# Patient Record
Sex: Female | Born: 1960 | Race: White | Hispanic: No | Marital: Married | Smoking: Former smoker
Health system: Southern US, Community
[De-identification: ages and names within clinical notes are randomized; demographics above are authoritative.]

## PROBLEM LIST (undated history)

## (undated) DIAGNOSIS — E039 Hypothyroidism, unspecified: Secondary | ICD-10-CM

## (undated) DIAGNOSIS — I10 Essential (primary) hypertension: Secondary | ICD-10-CM

## (undated) DIAGNOSIS — J810 Acute pulmonary edema: Secondary | ICD-10-CM

## (undated) DIAGNOSIS — G2581 Restless legs syndrome: Secondary | ICD-10-CM

## (undated) DIAGNOSIS — E079 Disorder of thyroid, unspecified: Secondary | ICD-10-CM

## (undated) DIAGNOSIS — I2510 Atherosclerotic heart disease of native coronary artery without angina pectoris: Secondary | ICD-10-CM

## (undated) DIAGNOSIS — F419 Anxiety disorder, unspecified: Secondary | ICD-10-CM

## (undated) HISTORY — PX: COLONOSCOPY: SHX174

## (undated) HISTORY — PX: APPENDECTOMY: SHX54

## (undated) HISTORY — PX: TUBAL LIGATION: SHX77

## (undated) HISTORY — PX: ABDOMINAL HYSTERECTOMY: SHX81

## (undated) HISTORY — PX: CHOLECYSTECTOMY: SHX55

## (undated) HISTORY — PX: OTHER SURGICAL HISTORY: SHX169

## (undated) HISTORY — PX: KNEE ARTHROSCOPY: SUR90

## (undated) HISTORY — DX: Acute pulmonary edema: J81.0

---

## 2007-10-28 ENCOUNTER — Emergency Department (HOSPITAL_COMMUNITY): Admission: EM | Admit: 2007-10-28 | Discharge: 2007-10-28 | Payer: Self-pay | Admitting: Emergency Medicine

## 2010-05-08 ENCOUNTER — Emergency Department (HOSPITAL_BASED_OUTPATIENT_CLINIC_OR_DEPARTMENT_OTHER): Admission: EM | Admit: 2010-05-08 | Discharge: 2010-05-08 | Payer: Self-pay | Admitting: Emergency Medicine

## 2010-09-17 ENCOUNTER — Encounter
Admission: RE | Admit: 2010-09-17 | Discharge: 2010-09-17 | Payer: Self-pay | Source: Home / Self Care | Attending: Family Medicine | Admitting: Family Medicine

## 2011-06-17 ENCOUNTER — Ambulatory Visit (HOSPITAL_COMMUNITY)
Admission: RE | Admit: 2011-06-17 | Discharge: 2011-06-17 | Disposition: A | Discharge status: Home / Self Care | Payer: 59 | Source: Ambulatory Visit | Attending: Family Medicine | Admitting: Family Medicine

## 2011-06-17 ENCOUNTER — Other Ambulatory Visit (HOSPITAL_COMMUNITY): Payer: Self-pay | Admitting: Family Medicine

## 2011-06-17 DIAGNOSIS — R52 Pain, unspecified: Secondary | ICD-10-CM

## 2011-06-17 DIAGNOSIS — R0789 Other chest pain: Secondary | ICD-10-CM | POA: Insufficient documentation

## 2011-06-18 LAB — I-STAT 8, (EC8 V) (CONVERTED LAB)
Acid-base deficit: 1
Bicarbonate: 22.9
Glucose, Bld: 123 — ABNORMAL HIGH
HCT: 40
Operator id: 198171
Sodium: 139
pCO2, Ven: 36.8 — ABNORMAL LOW
pH, Ven: 7.402 — ABNORMAL HIGH

## 2011-06-18 LAB — D-DIMER, QUANTITATIVE: D-Dimer, Quant: 0.3

## 2011-06-18 LAB — DIFFERENTIAL
Basophils Absolute: 0.1
Basophils Relative: 0
Lymphocytes Relative: 25
Lymphs Abs: 3.4
Neutro Abs: 9.3 — ABNORMAL HIGH
Neutrophils Relative %: 69

## 2011-06-18 LAB — POCT CARDIAC MARKERS
Operator id: 198171
Operator id: 198171
Troponin i, poc: <0.05
Troponin i, poc: <0.05

## 2011-06-18 LAB — CBC
Hemoglobin: 13.6
MCV: 92.4
Platelets: 334
RDW: 12.7

## 2012-12-17 ENCOUNTER — Emergency Department (HOSPITAL_BASED_OUTPATIENT_CLINIC_OR_DEPARTMENT_OTHER)
Admission: EM | Admit: 2012-12-17 | Discharge: 2012-12-17 | Disposition: A | Discharge status: Home / Self Care | Payer: 59 | Attending: Emergency Medicine | Admitting: Emergency Medicine

## 2012-12-17 ENCOUNTER — Encounter (HOSPITAL_BASED_OUTPATIENT_CLINIC_OR_DEPARTMENT_OTHER): Payer: Self-pay | Admitting: *Deleted

## 2012-12-17 DIAGNOSIS — F17200 Nicotine dependence, unspecified, uncomplicated: Secondary | ICD-10-CM | POA: Insufficient documentation

## 2012-12-17 DIAGNOSIS — E039 Hypothyroidism, unspecified: Secondary | ICD-10-CM | POA: Insufficient documentation

## 2012-12-17 DIAGNOSIS — K089 Disorder of teeth and supporting structures, unspecified: Secondary | ICD-10-CM | POA: Insufficient documentation

## 2012-12-17 DIAGNOSIS — R220 Localized swelling, mass and lump, head: Secondary | ICD-10-CM | POA: Insufficient documentation

## 2012-12-17 DIAGNOSIS — K0889 Other specified disorders of teeth and supporting structures: Secondary | ICD-10-CM

## 2012-12-17 DIAGNOSIS — Z79899 Other long term (current) drug therapy: Secondary | ICD-10-CM | POA: Insufficient documentation

## 2012-12-17 DIAGNOSIS — K029 Dental caries, unspecified: Secondary | ICD-10-CM | POA: Insufficient documentation

## 2012-12-17 HISTORY — DX: Disorder of thyroid, unspecified: E07.9

## 2012-12-17 HISTORY — DX: Hypothyroidism, unspecified: E03.9

## 2012-12-17 MED ORDER — OXYCODONE-ACETAMINOPHEN 5-325 MG PO TABS: 2.0000 | ORAL_TABLET | ORAL | Status: DC | PRN

## 2012-12-17 NOTE — ED Notes (Signed)
Pt c/o dental pain x several days. States she has a bad tooth.

## 2012-12-22 NOTE — ED Provider Notes (Signed)
History     CSN: [id number]  Arrival date & time 12/17/12  1922   None     Chief Complaint  Patient presents with  . Dental Pain    (Consider location/radiation/quality/duration/timing/severity/associated sxs/prior treatment) Patient is a 52 y.o. female presenting with tooth pain. The history is provided by the patient.  Dental PainThe primary symptoms include mouth pain. Primary symptoms do not include fever. Episode onset: several days ago. The symptoms are worsening. The symptoms are new. The symptoms occur constantly.  Additional symptoms include: gum swelling and gum tenderness. Additional symptoms do not include: facial swelling.    Past Medical History  Diagnosis Date  . Thyroid disease   . Hypothyroid     Past Surgical History  Procedure Laterality Date  . Abdominal hysterectomy    . Cholecystectomy    . Appendectomy      History reviewed. No pertinent family history.  History  Substance Use Topics  . Smoking status: Current Every Day Smoker  . Smokeless tobacco: Not on file  . Alcohol Use: No    OB History   Grav Para Term Preterm Abortions TAB SAB Ect Mult Living                  Review of Systems  Constitutional: Negative for fever.  HENT: Negative for facial swelling.   All other systems reviewed and are negative.    Allergies  Review of patient's allergies indicates no known allergies.  Home Medications   Current Outpatient Rx  Name  Route  Sig  Dispense  Refill  . carbidopa-levodopa (SINEMET CR) 50-200 MG per tablet   Oral   Take 1 tablet by mouth at bedtime.         . carbidopa-levodopa (SINEMET IR) 25-100 MG per tablet   Oral   Take 1 tablet by mouth 4 (four) times daily.         Marland Kitchen levothyroxine (SYNTHROID, LEVOTHROID) 88 MCG tablet   Oral   Take 88 mcg by mouth daily.         . simvastatin (ZOCOR) 40 MG tablet   Oral   Take 40 mg by mouth every evening.         . topiramate (TOPAMAX) 100 MG tablet   Oral   Take  100 mg by mouth 2 (two) times daily.         Marland Kitchen venlafaxine (EFFEXOR) 75 MG tablet   Oral   Take 75 mg by mouth 2 (two) times daily.         Marland Kitchen oxyCODONE-acetaminophen (PERCOCET) 5-325 MG per tablet   Oral   Take 2 tablets by mouth every 4 (four) hours as needed for pain.   20 tablet   0     BP 164/70  Pulse 108  Temp(Src) 98.1 F (36.7 C) (Oral)  Resp 20  Ht 5\' 3"  (1.6 m)  Wt 160 lb (72.576 kg)  BMI 28.35 kg/m2  SpO2 100%  Physical Exam  Nursing note and vitals reviewed. Constitutional: She is oriented to person, place, and time. She appears well-developed and well-nourished. No distress.  HENT:  Head: Normocephalic and atraumatic.  There are multiple decayed teeth present bilaterally, both upper and lower.  There is no facial swelling or erythema.  Neck: Normal range of motion. Neck supple.  Musculoskeletal: Normal range of motion.  Neurological: She is alert and oriented to person, place, and time.  Skin: Skin is warm and dry. She is not diaphoretic.  ED Course  Procedures (including critical care time)  Labs Reviewed - No data to display No results found.   1. Pain, dental       MDM  Will provide pain meds, follow up with dentist.  Return prn.        Veryl Speak, MD 12/22/12 [phone]

## 2014-08-27 ENCOUNTER — Other Ambulatory Visit (HOSPITAL_BASED_OUTPATIENT_CLINIC_OR_DEPARTMENT_OTHER): Payer: Self-pay | Admitting: Family Medicine

## 2014-08-27 ENCOUNTER — Ambulatory Visit (HOSPITAL_BASED_OUTPATIENT_CLINIC_OR_DEPARTMENT_OTHER)
Admission: RE | Admit: 2014-08-27 | Discharge: 2014-08-27 | Disposition: A | Discharge status: Home / Self Care | Payer: 59 | Source: Ambulatory Visit | Attending: Physician Assistant | Admitting: Physician Assistant

## 2014-08-27 ENCOUNTER — Other Ambulatory Visit (HOSPITAL_BASED_OUTPATIENT_CLINIC_OR_DEPARTMENT_OTHER): Payer: Self-pay | Admitting: Physician Assistant

## 2014-08-27 DIAGNOSIS — M25572 Pain in left ankle and joints of left foot: Secondary | ICD-10-CM

## 2014-09-04 ENCOUNTER — Other Ambulatory Visit (HOSPITAL_COMMUNITY): Payer: Self-pay | Admitting: Surgery

## 2014-09-05 ENCOUNTER — Other Ambulatory Visit (HOSPITAL_COMMUNITY): Payer: Self-pay | Admitting: Orthopedic Surgery

## 2014-09-05 DIAGNOSIS — M25572 Pain in left ankle and joints of left foot: Secondary | ICD-10-CM

## 2014-09-17 ENCOUNTER — Ambulatory Visit (HOSPITAL_COMMUNITY)
Admission: RE | Admit: 2014-09-17 | Discharge: 2014-09-17 | Disposition: A | Discharge status: Home / Self Care | Payer: 59 | Source: Ambulatory Visit | Attending: Orthopedic Surgery | Admitting: Orthopedic Surgery

## 2014-09-17 DIAGNOSIS — M25572 Pain in left ankle and joints of left foot: Secondary | ICD-10-CM | POA: Diagnosis present

## 2014-09-17 DIAGNOSIS — M93872 Other specified osteochondropathies, left ankle and foot: Secondary | ICD-10-CM | POA: Diagnosis not present

## 2014-09-17 DIAGNOSIS — M25472 Effusion, left ankle: Secondary | ICD-10-CM | POA: Insufficient documentation

## 2014-11-27 ENCOUNTER — Encounter (HOSPITAL_BASED_OUTPATIENT_CLINIC_OR_DEPARTMENT_OTHER): Payer: Self-pay | Admitting: *Deleted

## 2014-11-27 NOTE — Progress Notes (Signed)
To bring all meds and overnight bag-no labs needed

## 2014-11-29 ENCOUNTER — Ambulatory Visit (HOSPITAL_BASED_OUTPATIENT_CLINIC_OR_DEPARTMENT_OTHER)
Admission: RE | Admit: 2014-11-29 | Discharge: 2014-11-29 | Disposition: A | Discharge status: Home / Self Care | Payer: 59 | Source: Ambulatory Visit | Attending: Orthopedic Surgery | Admitting: Orthopedic Surgery

## 2014-11-29 ENCOUNTER — Ambulatory Visit (HOSPITAL_BASED_OUTPATIENT_CLINIC_OR_DEPARTMENT_OTHER): Payer: 59 | Admitting: Anesthesiology

## 2014-11-29 ENCOUNTER — Encounter (HOSPITAL_BASED_OUTPATIENT_CLINIC_OR_DEPARTMENT_OTHER): Payer: Self-pay | Admitting: Certified Registered"

## 2014-11-29 ENCOUNTER — Encounter (HOSPITAL_BASED_OUTPATIENT_CLINIC_OR_DEPARTMENT_OTHER)
Admission: RE | Disposition: A | Discharge status: Home / Self Care | Payer: Self-pay | Source: Ambulatory Visit | Attending: Orthopedic Surgery

## 2014-11-29 DIAGNOSIS — G2581 Restless legs syndrome: Secondary | ICD-10-CM | POA: Diagnosis not present

## 2014-11-29 DIAGNOSIS — M216X2 Other acquired deformities of left foot: Secondary | ICD-10-CM | POA: Diagnosis not present

## 2014-11-29 DIAGNOSIS — F419 Anxiety disorder, unspecified: Secondary | ICD-10-CM | POA: Insufficient documentation

## 2014-11-29 DIAGNOSIS — Z79899 Other long term (current) drug therapy: Secondary | ICD-10-CM | POA: Insufficient documentation

## 2014-11-29 DIAGNOSIS — F17210 Nicotine dependence, cigarettes, uncomplicated: Secondary | ICD-10-CM | POA: Diagnosis not present

## 2014-11-29 DIAGNOSIS — E039 Hypothyroidism, unspecified: Secondary | ICD-10-CM | POA: Diagnosis not present

## 2014-11-29 DIAGNOSIS — Q898 Other specified congenital malformations: Secondary | ICD-10-CM | POA: Diagnosis present

## 2014-11-29 HISTORY — PX: OSTEOCHONDRAL DEFECT REPAIR/RECONSTRUCTION: SHX6232

## 2014-11-29 HISTORY — DX: Anxiety disorder, unspecified: F41.9

## 2014-11-29 HISTORY — DX: Restless legs syndrome: G25.81

## 2014-11-29 LAB — POCT HEMOGLOBIN-HEMACUE: Hemoglobin: 14.5 g/dL (ref 12.0–15.0)

## 2014-11-29 SURGERY — APPLICATION, GRAFT, OSTEOCHONDRAL, KNEE
Anesthesia: Regional | Site: Ankle | Laterality: Left

## 2014-11-29 MED ORDER — MIDAZOLAM HCL 2 MG/2ML IJ SOLN
1.0000 mg | INTRAMUSCULAR | Status: DC | PRN
  Administered 2014-11-29: 2 mg via INTRAVENOUS

## 2014-11-29 MED ORDER — PROMETHAZINE HCL 12.5 MG PO TABS: 12.5000 mg | ORAL_TABLET | Freq: Four times a day (QID) | ORAL | Status: DC | PRN

## 2014-11-29 MED ORDER — OXYCODONE HCL 5 MG PO TABS: 5.0000 mg | ORAL_TABLET | Freq: Once | ORAL | Status: DC | PRN

## 2014-11-29 MED ORDER — BACITRACIN ZINC 500 UNIT/GM EX OINT
TOPICAL_OINTMENT | CUTANEOUS | Status: DC | PRN
  Administered 2014-11-29: 1 via TOPICAL

## 2014-11-29 MED ORDER — EVICEL 2 ML EX KIT
PACK | CUTANEOUS | Status: DC | PRN
  Administered 2014-11-29: 2 mL

## 2014-11-29 MED ORDER — MIDAZOLAM HCL 2 MG/2ML IJ SOLN
INTRAMUSCULAR | Status: AC
  Filled 2014-11-29: qty 2

## 2014-11-29 MED ORDER — OXYCODONE HCL 5 MG/5ML PO SOLN: 5.0000 mg | Freq: Once | ORAL | Status: DC | PRN

## 2014-11-29 MED ORDER — FENTANYL CITRATE 0.05 MG/ML IJ SOLN
INTRAMUSCULAR | Status: AC
  Filled 2014-11-29: qty 6

## 2014-11-29 MED ORDER — 0.9 % SODIUM CHLORIDE (POUR BTL) OPTIME
TOPICAL | Status: DC | PRN
  Administered 2014-11-29: 200 mL

## 2014-11-29 MED ORDER — PROMETHAZINE HCL 25 MG/ML IJ SOLN: 6.2500 mg | INTRAMUSCULAR | Status: DC | PRN

## 2014-11-29 MED ORDER — CEFAZOLIN SODIUM-DEXTROSE 2-3 GM-% IV SOLR
INTRAVENOUS | Status: AC
  Filled 2014-11-29: qty 50

## 2014-11-29 MED ORDER — OXYCODONE HCL 5 MG PO TABS: 5.0000 mg | ORAL_TABLET | ORAL | Status: DC | PRN

## 2014-11-29 MED ORDER — ROPIVACAINE HCL 5 MG/ML IJ SOLN
INTRAMUSCULAR | Status: DC | PRN
  Administered 2014-11-29: 22.5 mL via PERINEURAL

## 2014-11-29 MED ORDER — HYDROMORPHONE HCL 1 MG/ML IJ SOLN: 0.2500 mg | INTRAMUSCULAR | Status: DC | PRN

## 2014-11-29 MED ORDER — SODIUM CHLORIDE 0.9 % IV SOLN
INTRAVENOUS | Status: DC
Start: 2014-11-29 — End: 2014-11-29

## 2014-11-29 MED ORDER — LACTATED RINGERS IV SOLN
INTRAVENOUS | Status: DC
  Administered 2014-11-29 (×2): via INTRAVENOUS

## 2014-11-29 MED ORDER — PROPOFOL 10 MG/ML IV BOLUS
INTRAVENOUS | Status: DC | PRN
  Administered 2014-11-29: 150 mg via INTRAVENOUS

## 2014-11-29 MED ORDER — FENTANYL CITRATE 0.05 MG/ML IJ SOLN
INTRAMUSCULAR | Status: AC
  Filled 2014-11-29: qty 2

## 2014-11-29 MED ORDER — CHLORHEXIDINE GLUCONATE 4 % EX LIQD: 60.0000 mL | Freq: Once | CUTANEOUS | Status: DC

## 2014-11-29 MED ORDER — FENTANYL CITRATE 0.05 MG/ML IJ SOLN
50.0000 ug | INTRAMUSCULAR | Status: DC | PRN
  Administered 2014-11-29: 100 ug via INTRAVENOUS

## 2014-11-29 MED ORDER — BUPIVACAINE-EPINEPHRINE (PF) 0.5% -1:200000 IJ SOLN
INTRAMUSCULAR | Status: AC
  Filled 2014-11-29: qty 30

## 2014-11-29 MED ORDER — FENTANYL CITRATE 0.05 MG/ML IJ SOLN
INTRAMUSCULAR | Status: DC | PRN
  Administered 2014-11-29: 25 ug via INTRAVENOUS

## 2014-11-29 MED ORDER — BUPIVACAINE-EPINEPHRINE (PF) 0.5% -1:200000 IJ SOLN
INTRAMUSCULAR | Status: DC | PRN
  Administered 2014-11-29: 22.5 mL via PERINEURAL

## 2014-11-29 MED ORDER — ASPIRIN EC 325 MG PO TBEC: 325.0000 mg | DELAYED_RELEASE_TABLET | Freq: Every day | ORAL | Status: DC

## 2014-11-29 MED ORDER — BACITRACIN ZINC 500 UNIT/GM EX OINT
TOPICAL_OINTMENT | CUTANEOUS | Status: AC
  Filled 2014-11-29: qty 28.35

## 2014-11-29 MED ORDER — CEFAZOLIN SODIUM-DEXTROSE 2-3 GM-% IV SOLR
2.0000 g | INTRAVENOUS | Status: AC
  Administered 2014-11-29: 2 g via INTRAVENOUS

## 2014-11-29 MED ORDER — ONDANSETRON HCL 4 MG/2ML IJ SOLN
INTRAMUSCULAR | Status: DC | PRN
  Administered 2014-11-29: 4 mg via INTRAVENOUS

## 2014-11-29 MED ORDER — DEXAMETHASONE SODIUM PHOSPHATE 10 MG/ML IJ SOLN
INTRAMUSCULAR | Status: DC | PRN
  Administered 2014-11-29: 10 mg via INTRAVENOUS

## 2014-11-29 SURGICAL SUPPLY — 70 items
ARTICULAR CARTLIGE LIVE HUMAN (Tissue) ×2 IMPLANT
BANDAGE ESMARK 6X9 LF (GAUZE/BANDAGES/DRESSINGS) ×1 IMPLANT
BIT DRILL 2.9 CANN QC NONSTRL (BIT) ×2 IMPLANT
BIT TREPHINE CORING 8 (BIT) ×2 IMPLANT
BLADE MICRO SAGITTAL (BLADE) ×2 IMPLANT
BLADE SURG 15 STRL LF DISP TIS (BLADE) ×3 IMPLANT
BLADE SURG 15 STRL SS (BLADE) ×3
BNDG COHESIVE 4X5 TAN STRL (GAUZE/BANDAGES/DRESSINGS) ×2 IMPLANT
BNDG COHESIVE 6X5 TAN STRL LF (GAUZE/BANDAGES/DRESSINGS) ×2 IMPLANT
BNDG ESMARK 6X9 LF (GAUZE/BANDAGES/DRESSINGS) ×2
CARTILAGE ARTICULAR LIVE HUMAN (Tissue) ×1 IMPLANT
COVER BACK TABLE 60X90IN (DRAPES) ×2 IMPLANT
DECANTER SPIKE VIAL GLASS SM (MISCELLANEOUS) IMPLANT
DRAPE C-ARM 42X72 X-RAY (DRAPES) IMPLANT
DRAPE C-ARMOR (DRAPES) IMPLANT
DRAPE EXTREMITY T 121X128X90 (DRAPE) ×2 IMPLANT
DRAPE OEC MINIVIEW 54X84 (DRAPES) ×2 IMPLANT
DRAPE U 20/CS (DRAPES) IMPLANT
DRAPE U-SHAPE 47X51 STRL (DRAPES) ×2 IMPLANT
DRSG ADAPTIC 3X8 NADH LF (GAUZE/BANDAGES/DRESSINGS) IMPLANT
DRSG EMULSION OIL 3X3 NADH (GAUZE/BANDAGES/DRESSINGS) ×2 IMPLANT
DRSG PAD ABDOMINAL 8X10 ST (GAUZE/BANDAGES/DRESSINGS) ×4 IMPLANT
DURAPREP 26ML APPLICATOR (WOUND CARE) ×2 IMPLANT
ELECT REM PT RETURN 9FT ADLT (ELECTROSURGICAL) ×2
ELECTRODE REM PT RTRN 9FT ADLT (ELECTROSURGICAL) ×1 IMPLANT
GLOVE BIO SURGEON STRL SZ8 (GLOVE) ×2 IMPLANT
GLOVE BIOGEL PI IND STRL 7.0 (GLOVE) ×2 IMPLANT
GLOVE BIOGEL PI IND STRL 8 (GLOVE) ×1 IMPLANT
GLOVE BIOGEL PI INDICATOR 7.0 (GLOVE) ×2
GLOVE BIOGEL PI INDICATOR 8 (GLOVE) ×1
GLOVE ECLIPSE 6.5 STRL STRAW (GLOVE) ×4 IMPLANT
GLOVE EXAM NITRILE MD LF STRL (GLOVE) ×2 IMPLANT
GOWN STRL REUS W/ TWL LRG LVL3 (GOWN DISPOSABLE) ×3 IMPLANT
GOWN STRL REUS W/ TWL XL LVL3 (GOWN DISPOSABLE) ×1 IMPLANT
GOWN STRL REUS W/TWL LRG LVL3 (GOWN DISPOSABLE) ×3
GOWN STRL REUS W/TWL XL LVL3 (GOWN DISPOSABLE) ×1
K-WIRE ACE 1.6X6 (WIRE) ×6
KWIRE ACE 1.6X6 (WIRE) ×3 IMPLANT
NEEDLE HYPO 22GX1.5 SAFETY (NEEDLE) IMPLANT
PACK BASIN DAY SURGERY FS (CUSTOM PROCEDURE TRAY) ×2 IMPLANT
PAD CAST 4YDX4 CTTN HI CHSV (CAST SUPPLIES) ×2 IMPLANT
PADDING CAST ABS 4INX4YD NS (CAST SUPPLIES) ×1
PADDING CAST ABS COTTON 4X4 ST (CAST SUPPLIES) ×1 IMPLANT
PADDING CAST COTTON 4X4 STRL (CAST SUPPLIES) ×2
PADDING CAST COTTON 6X4 STRL (CAST SUPPLIES) ×2 IMPLANT
PENCIL BUTTON HOLSTER BLD 10FT (ELECTRODE) ×2 IMPLANT
SANITIZER HAND PURELL 535ML FO (MISCELLANEOUS) ×2 IMPLANT
SCREW ACE CAN 4.0 36M (Screw) ×2 IMPLANT
SCREW ACE CAN 4.0 42M (Screw) ×2 IMPLANT
SHEET MEDIUM DRAPE 40X70 STRL (DRAPES) ×2 IMPLANT
SLEEVE SCD COMPRESS KNEE MED (MISCELLANEOUS) ×2 IMPLANT
SPLINT FAST PLASTER 5X30 (CAST SUPPLIES) ×20
SPLINT PLASTER CAST FAST 5X30 (CAST SUPPLIES) ×20 IMPLANT
SPONGE LAP 18X18 X RAY DECT (DISPOSABLE) ×2 IMPLANT
SPONGE SURGIFOAM ABS GEL 12-7 (HEMOSTASIS) ×2 IMPLANT
STOCKINETTE 6  STRL (DRAPES) ×1
STOCKINETTE 6 STRL (DRAPES) ×1 IMPLANT
SUCTION FRAZIER TIP 10 FR DISP (SUCTIONS) ×2 IMPLANT
SUT ETHILON 3 0 PS 1 (SUTURE) IMPLANT
SUT MNCRL AB 3-0 PS2 18 (SUTURE) ×2 IMPLANT
SUT VIC AB 0 SH 27 (SUTURE) IMPLANT
SUT VIC AB 2-0 SH 27 (SUTURE)
SUT VIC AB 2-0 SH 27XBRD (SUTURE) IMPLANT
SYR 20CC LL (SYRINGE) IMPLANT
SYR BULB 3OZ (MISCELLANEOUS) ×2 IMPLANT
TAP 4 QC CANN F/CANC SCRW (TRAUMA) ×2 IMPLANT
TOWEL OR 17X24 6PK STRL BLUE (TOWEL DISPOSABLE) ×2 IMPLANT
TUBE CONNECTING 20X1/4 (TUBING) ×2 IMPLANT
UNDERPAD 30X30 INCONTINENT (UNDERPADS AND DIAPERS) ×2 IMPLANT
YANKAUER SUCT BULB TIP NO VENT (SUCTIONS) IMPLANT

## 2014-11-29 NOTE — Brief Op Note (Signed)
11/29/2014  11:18 AM  PATIENT:  Meghan Terry  54 y.o. female  PRE-OPERATIVE DIAGNOSIS:  LEFT Talus osteochondral defect  POST-OPERATIVE DIAGNOSIS:  same  Procedure(s): 1.  Excision of left medial talus cyst with medial malleolar osteotomy 2.  Autograft bone from the calcaneus to the talus 3.  Denovo allograft cartilage to the talus 4.  AP, mortise and lateral xrays of the left ankle  SURGEON:  Wylene Simmer, MD  ASSISTANT: n/a  ANESTHESIA:   General, regional  EBL:  minimal   TOURNIQUET:   Total Tourniquet Time Documented: Thigh (Left) - 83 minutes Total: Thigh (Left) - 83 minutes  COMPLICATIONS:  None apparent  DISPOSITION:  Extubated, awake and stable to recovery.  DICTATION ID:  [id number]

## 2014-11-29 NOTE — Transfer of Care (Signed)
Immediate Anesthesia Transfer of Care Note  Patient: Meghan Terry  Procedure(s) Performed: Procedure(s): LEFT ANKLE MEDIAL MALLEOLUS OSTEOTOMY,AUTO GRAFT FROM CALCANEOUS,OS TIBIA ,DENORO GRAFTING TALUS (Left)  Patient Location: PACU  Anesthesia Type:GA combined with regional for post-op pain  Level of Consciousness: awake and patient cooperative  Airway & Oxygen Therapy: Patient Spontanous Breathing and Patient connected to face mask oxygen  Post-op Assessment: Report given to RN and Post -op Vital signs reviewed and stable  Post vital signs: Reviewed and stable  Last Vitals:  Filed Vitals:   11/29/14 0855  BP:   Pulse: 100  Temp:   Resp: 15    Complications: No apparent anesthesia complications

## 2014-11-29 NOTE — Anesthesia Postprocedure Evaluation (Signed)
  Anesthesia Post-op Note  Patient: Meghan Terry  Procedure(s) Performed: Procedure(s): LEFT ANKLE MEDIAL MALLEOLUS OSTEOTOMY,AUTO GRAFT FROM CALCANEOUS;  OS TIBIA DENORO GRAFTING TALUS (Left)  Patient Location: PACU  Anesthesia Type:GA combined with regional for post-op pain  Level of Consciousness: awake and alert   Airway and Oxygen Therapy: Patient Spontanous Breathing  Post-op Pain: none  Post-op Assessment: Post-op Vital signs reviewed  Post-op Vital Signs: Reviewed  Last Vitals:  Filed Vitals:   11/29/14 1300  BP: 123/69  Pulse: 89  Temp: 36.5 C  Resp: 20    Complications: No apparent anesthesia complications

## 2014-11-29 NOTE — Discharge Instructions (Signed)
Post Anesthesia Home Care Instructions  Activity: Get plenty of rest for the remainder of the day. A responsible adult should stay with you for 24 hours following the procedure.  For the next 24 hours, DO NOT: -Drive a car -Paediatric nurse -Drink alcoholic beverages -Take any medication unless instructed by your physician -Make any legal decisions or sign important papers.  Meals: Start with liquid foods such as gelatin or soup. Progress to regular foods as tolerated. Avoid greasy, spicy, heavy foods. If nausea and/or vomiting occur, drink only clear liquids until the nausea and/or vomiting subsides. Call your physician if vomiting continues.  Special Instructions/Symptoms: Your throat may feel dry or sore from the anesthesia or the breathing tube placed in your throat during surgery. If this causes discomfort, gargle with warm salt water. The discomfort should disappear within 24 hours. Regional Anesthesia Blocks  1. Numbness or the inability to move the "blocked" extremity may last from 3-48 hours after placement. The length of time depends on the medication injected and your individual response to the medication. If the numbness is not going away after 48 hours, call your surgeon.  2. The extremity that is blocked will need to be protected until the numbness is gone and the  Strength has returned. Because you cannot feel it, you will need to take extra care to avoid injury. Because it may be weak, you may have difficulty moving it or using it. You may not know what position it is in without looking at it while the block is in effect.  3. For blocks in the legs and feet, returning to weight bearing and walking needs to be done carefully. You will need to wait until the numbness is entirely gone and the strength has returned. You should be able to move your leg and foot normally before you try and bear weight or walk. You will need someone to be with you when you first try to ensure you do  not fall and possibly risk injury.  4. Bruising and tenderness at the needle site are common side effects and will resolve in a few days.  5. Persistent numbness or new problems with movement should be communicated to the surgeon or the [place] [phone] [place] [phone]).  Wylene Simmer, MD [place]  Please read the following information regarding your care after surgery.  Medications  You only need a prescription for the narcotic pain medicine (ex. oxycodone, Percocet, Norco).  All of the other medicines listed below are available over the counter. X acetominophen (Tylenol) 650 mg every 4-6 hours as you need for minor pain X oxycodone as prescribed for moderate to severe pain  Narcotic pain medicine (ex. oxycodone, Percocet, Vicodin) will cause constipation.  To prevent this problem, take the following medicines while you are taking any pain medicine. X docusate sodium (Colace) 100 mg twice a day X senna (Senokot) 2 tablets twice a day  X To help prevent blood clots, take an aspirin (325 mg) once a day for a month after surgery.  You should also get up every hour while you are awake to move around.    Weight Bearing ? Bear weight when you are able on your operated leg or foot. ? Bear weight only on the heel of your operated foot in the post-op shoe. X Do not bear any weight on the operated leg or foot.  Cast / Splint / Dressing X Keep your splint or cast clean and dry.  Dont put anything (coat hanger, pencil, etc) down inside of it.  If it gets damp, use a hair dryer on the cool setting to dry it.  If it gets soaked, call the office to schedule an appointment for a cast change. ? Remove your dressing 3 days after surgery and cover the incisions with dry dressings.    After your dressing, cast or splint is removed; you may shower, but do not soak or scrub the wound.  Allow the water to run over it, and then gently pat it  dry.  Swelling It is normal for you to have swelling where you had surgery.  To reduce swelling and pain, keep your toes above your nose for at least 3 days after surgery.  It may be necessary to keep your foot or leg elevated for several weeks.  If it hurts, it should be elevated.  Follow Up Call my office at [phone] when you are discharged from the hospital or surgery center to schedule an appointment to be seen two weeks after surgery.  Call my office at [phone] if you develop a fever >101.5 F, nausea, vomiting, bleeding from the surgical site or severe pain.

## 2014-11-29 NOTE — Anesthesia Procedure Notes (Addendum)
Anesthesia Regional Block:  Popliteal block  Pre-Anesthetic Checklist: ,, timeout performed, Correct Patient, Correct Site, Correct Laterality, Correct Procedure, Correct Position, site marked, Risks and benefits discussed,  Surgical consent,  Pre-op evaluation,  At surgeon's request and post-op pain management  Laterality: Left  Prep: chloraprep       Needles:  Injection technique: Single-shot  Needle Type: Echogenic Stimulator Needle     Needle Length: 9cm 9 cm Needle Gauge: 21 and 21 G    Additional Needles:  Procedures: ultrasound guided (picture in chart) and nerve stimulator Popliteal block  Nerve Stimulator or Paresthesia:  Response: tibial, 0.45 mA,  Response: peroneal , 0.45 mA,   Additional Responses:   Narrative:  Start time: 11/29/2014 8:40 AM End time: 11/29/2014 8:47 AM Injection made incrementally with aspirations every 5 mL.  Performed by: Personally  Anesthesiologist: Suzette Battiest E  Additional Notes: Risks and benefits explained to pt. Tolerated procedure well with no immediate complications. Total of 15 cc's 0.5% bupi with 1:200k epi and 15cc's 0.5% ropi injected.   Anesthesia Regional Block:  Adductor canal block  Pre-Anesthetic Checklist: ,, timeout performed, Correct Patient, Correct Site, Correct Laterality, Correct Procedure, Correct Position, site marked, Risks and benefits discussed,  Surgical consent,  Pre-op evaluation,  At surgeon's request and post-op pain management  Laterality: Left  Prep: chloraprep       Needles:  Injection technique: Single-shot  Needle Type: Echogenic Needle     Needle Length: 9cm 9 cm Needle Gauge: 21 and 21 G    Additional Needles:  Procedures: ultrasound guided (picture in chart) Adductor canal block Narrative:  Start time: 11/29/2014 8:47 AM End time: 11/29/2014 8:50 AM Injection made incrementally with aspirations every 5 mL.  Performed by: Personally  Anesthesiologist: Suzette Battiest  E  Additional Notes: Risks and benefits explained to pt. Pt tolerated procedure well with no immediate complications. 7.5cc's 0.5% bupi with 1:200k epi and 7.5cc's 0.5% ropi injected.   Procedure Name: LMA Insertion Date/Time: 11/29/2014 9:19 AM Performed by: Shamica Moree Pre-anesthesia Checklist: Patient identified, Emergency Drugs available, Suction available and Patient being monitored Patient Re-evaluated:Patient Re-evaluated prior to inductionOxygen Delivery Method: Circle System Utilized Preoxygenation: Pre-oxygenation with 100% oxygen Intubation Type: IV induction Ventilation: Mask ventilation without difficulty LMA: LMA inserted LMA Size: 4.0 Number of attempts: 1 Airway Equipment and Method: Bite block Placement Confirmation: positive ETCO2 Tube secured with: Tape Dental Injury: Teeth and Oropharynx as per pre-operative assessment

## 2014-11-29 NOTE — Progress Notes (Signed)
Assisted Dr. Rodman Comp with left, ultrasound guided, popliteal/saphenous block. Side rails up, monitors on throughout procedure. See vital signs in flow sheet. Tolerated Procedure well.

## 2014-11-29 NOTE — Anesthesia Preprocedure Evaluation (Addendum)
Anesthesia Evaluation  Patient identified by MRN, date of birth, ID band Patient awake    Reviewed: Allergy & Precautions, NPO status , Patient's Chart, lab work & pertinent test results  Airway Mallampati: II  TM Distance: >3 FB Neck ROM: Full    Dental  (+) Lower Dentures, Upper Dentures   Pulmonary Current Smoker,  breath sounds clear to auscultation        Cardiovascular negative cardio ROS  Rhythm:Regular Rate:Normal     Neuro/Psych negative neurological ROS     GI/Hepatic negative GI ROS, Neg liver ROS,   Endo/Other  Hypothyroidism   Renal/GU negative Renal ROS     Musculoskeletal   Abdominal   Peds  Hematology negative hematology ROS (+)   Anesthesia Other Findings   Reproductive/Obstetrics                            Anesthesia Physical Anesthesia Plan  ASA: II  Anesthesia Plan: General and Regional   Post-op Pain Management:    Induction: Intravenous  Airway Management Planned: LMA and Oral ETT  Additional Equipment:   Intra-op Plan:   Post-operative Plan:   Informed Consent: I have reviewed the patients History and Physical, chart, labs and discussed the procedure including the risks, benefits and alternatives for the proposed anesthesia with the patient or authorized representative who has indicated his/her understanding and acceptance.   Dental advisory given  Plan Discussed with: CRNA  Anesthesia Plan Comments:         Anesthesia Quick Evaluation

## 2014-11-29 NOTE — H&P (Signed)
Meghan Terry is an 54 y.o. female.   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  She has failed non operateive treatment to date and presents today for surgical treatment.  Past Medical History  Diagnosis Date  . Thyroid disease   . Hypothyroid   . Anxiety   . RLS (restless legs syndrome)     Past Surgical History  Procedure Laterality Date  . Abdominal hysterectomy    . Cholecystectomy    . Appendectomy    . Tubal ligation    . Colonoscopy      History reviewed. No pertinent family history. Social History:  reports that she has been smoking.  She does not have any smokeless tobacco history on file. She reports that she does not drink alcohol or use illicit drugs.  Allergies: No Known Allergies  Medications Prior to Admission  Medication Sig Dispense Refill  . buPROPion (WELLBUTRIN) 75 MG tablet Take 75 mg by mouth every morning.    . carbidopa-levodopa (SINEMET CR) 50-200 MG per tablet Take 1 tablet by mouth at bedtime.    . carbidopa-levodopa (SINEMET IR) 25-100 MG per tablet Take 1 tablet by mouth 4 (four) times daily.    Marland Kitchen levothyroxine (SYNTHROID, LEVOTHROID) 88 MCG tablet Take 88 mcg by mouth daily.    Marland Kitchen topiramate (TOPAMAX) 100 MG tablet Take 100 mg by mouth 2 (two) times daily.    Marland Kitchen venlafaxine (EFFEXOR) 75 MG tablet Take 75 mg by mouth 2 (two) times daily.      Results for orders placed or performed during the hospital encounter of 12/23/2014 (from the past 48 hour(s))  Hemoglobin-hemacue, POC     Status: None   Collection Time: 23-Dec-2014  8:15 AM  Result Value Ref Range   Hemoglobin 14.5 12.0 - 15.0 g/dL   No results found.  ROS  No recent f/c/n/v/wt loss  Blood pressure 141/77, pulse 88, temperature 98 F (36.7 C), temperature source Oral, resp. rate 16, height 5\' 2"  (1.575 m), weight 76.885 kg (169 lb 8 oz), SpO2 99 %. Physical Exam  wn wd woman in nad.  A and O x 4.  Mood and affect  normal.  EOMI.  resp unlabored.  L ankle with healthy skin.  No lymphaenopathy.  Normal sens to LT at the foot.  5/5 strength in PF and DF of the ankle and toes.  2+ dp and pt pulses.  Assessment/Plan L talus osteochondral defect.  To OR for bone grafting of the left osteochondral defect from the calcaneus and allograft cartilage implantation.  The risks and benefits of the alternative treatment options have been discussed in detail.  The patient wishes to proceed with surgery and specifically understands risks of bleeding, infection, nerve damage, blood clots, need for additional surgery, amputation and death.   Wylene Simmer 2014-12-23, 8:56 AM

## 2014-11-30 ENCOUNTER — Encounter (HOSPITAL_BASED_OUTPATIENT_CLINIC_OR_DEPARTMENT_OTHER): Payer: Self-pay | Admitting: Orthopedic Surgery

## 2014-11-30 NOTE — Op Note (Signed)
NAMEMarland Kitchen  Meghan Terry, Meghan Terry NO.:  [id number]  MEDICAL RECORD NO.:  [id number]  LOCATION:                                 FACILITY:  PHYSICIAN:  Wylene Simmer, MD        DATE OF BIRTH:  Sep 27, 1961  DATE OF PROCEDURE:  11/29/2014 DATE OF DISCHARGE:                              OPERATIVE REPORT   PREOPERATIVE DIAGNOSIS:  Left talus osteochondral defect.  POSTOPERATIVE DIAGNOSIS:  Left talus osteochondral defect.  PROCEDURE: 1. Excision of left medial talus cyst with medial malleolar osteotomy. 2. Autograft bone from the calcaneus to the talus. 3. DeNovo allograft cartilage to the talus. 4. AP, mortise, and lateral radiographs of the left ankle.  SURGEON:  Wylene Simmer, MD  ANESTHESIA:  General, regional.  ESTIMATED BLOOD LOSS:  Minimal tourniquet.  TIME OF TOURNIQUET:  83 minutes at 250 mmHg.  COMPLICATIONS:  None apparent.  DISPOSITION:  Extubated, awake and stable to recovery.  INDICATIONS FOR PROCEDURE:  The patient is a 54 year old woman with left ankle pain after an injury at work.  She was found have a cystic osteochondral lesion of the medial talar dome.  She has failed nonoperative treatment to date and presents today for surgical care. She understands the risks, benefits, the alternative treatment options, and elects surgical treatment.  She specifically understands risks of bleeding, infection, nerve damage, blood clots, need for additional surgery, continued pain, amputation, and death.  PROCEDURE IN DETAIL:  After preoperative consent was obtained and the correct operative site was identified, the patient was brought to the operating room and placed supine on the operating table.  General anesthesia was induced.  Preoperative antibiotics were administered. Surgical time-out was taken.  Left lower extremity was prepped and draped in standard sterile fashion with tourniquet around the thigh. Extremity was exsanguinated and tourniquet was inflated to  250 mmHg.  A longitudinal incision was then made over the medial ankle.  Sharp dissection was carried down through the skin and subcutaneous tissue. Care was taken to protect the saphenous nerve and vein.  The posterior tibial tendon sheath was incised and retractor was placed subperiosteally posterior and anterior to the tibia protecting the tendons and neurovascular structures.  A guide pin was then inserted through the metaphyseal bone of the medial tibia, angled towards the corner of the medial tibial plafond.  AP and lateral radiographs confirmed appropriate position of this guide pin.  An oscillating saw was then used to create an osteotomy from proximal to distal into the medial tibial plafond.  The saw was removed and the osteotomy was completed with an osteotome into the ankle joint preserving the cartilage at that level.  Prior to completing the osteotomy, drill holes were made perpendicular to the saw cut and were tapped for 4 mm cannulated screws.  The medial talar dome was then exposed.  The cystic lesion was identified.  The unstable cartilage was curetted and removed to a stable rim.  The lesion measured 1.1 cm x 0.8 cm.  The subchondral bone was largely intact except for a small hole about 3 mm across that accessed to the cyst below that.  The cyst was  curetted of all synovial tissue and irrigated copiously.  Attention was then turned to the lateral aspect of the heel where an oblique incision was made over the tuberosity of the calcaneus.  Sharp dissection was carried down through the skin and subcutaneous tissue. An 8 mm trephine was used to harvest an adequate amount of bone graft. The wound was irrigated and a Gelfoam roll was placed within the bone. The incision was closed with 3-0 nylon.  Bone graft was then placed in the cyst and impacted into position to fill it appropriately.  A layer of Tisseel was placed on the subchondral bone and allowed to become  tacky.  On the back table, the [facility] allograft package was opened and all of the liquid removed.  The particles were then arranged on the foil in a 1 mm x 0.8 mm monolayer.  Tisseel was applied creating a patty.  This was allowed to thicken.  The patty was then placed within the talar defect and another thin layer of Tisseel placed over the top.  The joint was reduced and range of motion in plantar flexion and dorsiflexion was tested.  There was no delaminating of the cartilage pieces.  The medial malleolar osteotomy fragment was then replaced and 2 screws inserted and tightened appropriately.  AP, mortise, and lateral radiographs confirmed appropriate reduction of the osteotomy.  The medial wound was then irrigated and closed with Monocryl and nylon.  Sterile dressings were applied followed by well-padded short-leg splint.  Tourniquet was released at 83 minutes.  The patient was awakened from anesthesia and transported to recovery room in stable condition.  FOLLOWUP PLAN:  The patient will be nonweightbearing on the left lower extremity.  She will follow up with me in 2 weeks for suture removal and conversion to a short-leg cast.  The patient will take aspirin for DVT prophylaxis.  RADIOGRAPHS:  AP, lateral, and mortise radiographs of the left ankle were obtained intraoperatively.  These show appropriate reduction of the medial malleolar osteotomy site, and no evidence of any acute injury.     Wylene Simmer, MD     JH/MEDQ  D:  11/29/2014  T:  11/30/2014  Job:  [id number]

## 2015-09-30 MED FILL — LEVOTHYROXINE 75 MCG TABLET: 75 | 90 days supply | Qty: 90 | Fill #0

## 2015-10-23 MED FILL — CARBIDOPA-LEVO ER 50-200 TA: 50-200 | 30 days supply | Qty: 30 | Fill #1

## 2015-10-23 MED FILL — CARBIDOPA-LEVODOPA 25-250 T: 25-250 | 90 days supply | Qty: 270 | Fill #2

## 2015-10-23 MED FILL — ARIPiprazole 5 MG TABS: 5 | 30 days supply | Qty: 30 | Fill #6

## 2015-11-22 MED FILL — ARIPiprazole 5 MG TABS: 5 | 30 days supply | Qty: 30 | Fill #7

## 2015-11-22 MED FILL — VENLAFAXINE HCL ER 75 MG CA: 75 | 30 days supply | Qty: 60 | Fill #1

## 2015-11-22 MED FILL — CARBIDOPA-LEVO ER 50-200 TA: 50-200 | 30 days supply | Qty: 30 | Fill #2

## 2015-11-22 MED FILL — metFORMIN HCL 1000 MG TABS: 1000 | 30 days supply | Qty: 30 | Fill #0

## 2015-11-29 MED FILL — BUPROPION HCL XL 150 MG TAB: 150 | 90 days supply | Qty: 180 | Fill #0

## 2015-12-17 MED FILL — CIPROFLOXACIN HCL 500 MG TA: 500 | 7 days supply | Qty: 14 | Fill #0

## 2015-12-17 MED FILL — BUPROPION HCL XL 300 MG TAB: 300 | 90 days supply | Qty: 90 | Fill #0

## 2015-12-17 MED FILL — metFORMIN HCL 1000 MG TABS: 1000 | 90 days supply | Qty: 180 | Fill #0

## 2015-12-17 MED FILL — CARBIDOPA-LEVODOPA 25-250 T: 25-250 | 90 days supply | Qty: 360 | Fill #0

## 2015-12-19 MED FILL — SULFAMETHOXAZOLE-TMP DS TAB: 800-160 | 7 days supply | Qty: 14 | Fill #0

## 2016-01-03 MED FILL — CARBIDOPA-LEVO ER 50-200 TA: 50-200 | 30 days supply | Qty: 30 | Fill #3

## 2016-01-03 MED FILL — ARIPiprazole 5 MG TABS: 5 | 30 days supply | Qty: 30 | Fill #8

## 2016-01-03 MED FILL — VENLAFAXINE HCL ER 75 MG CA: 75 | 30 days supply | Qty: 60 | Fill #2

## 2016-01-24 MED FILL — TOPIRAMATE 100 MG TABLET: 100 | 90 days supply | Qty: 180 | Fill #0

## 2016-01-31 MED FILL — CARBIDOPA-LEVO ER 50-200 TA: 50-200 | 30 days supply | Qty: 30 | Fill #4

## 2016-01-31 MED FILL — LEVOTHYROXINE 75 MCG TABLET: 75 | 30 days supply | Qty: 30 | Fill #1

## 2016-02-17 MED FILL — VENLAFAXINE HCL ER 75 MG CA: 75 | 90 days supply | Qty: 270 | Fill #0

## 2016-02-17 MED FILL — LORazepam 0.5 MG TABS: 0.5 | 15 days supply | Qty: 30 | Fill #0

## 2016-02-18 MED FILL — ARIPiprazole 5 MG TABS: 5 | 90 days supply | Qty: 90 | Fill #0

## 2016-03-06 MED FILL — LEVOTHYROXINE 75 MCG TABLET: 75 | 30 days supply | Qty: 30 | Fill #2

## 2016-03-06 MED FILL — CARBIDOPA-LEVO ER 50-200 TA: 50-200 | 30 days supply | Qty: 30 | Fill #5

## 2016-03-20 MED FILL — CARBIDOPA-LEVODOPA 25-250 T: 25-250 | 90 days supply | Qty: 360 | Fill #1

## 2016-04-02 MED FILL — BUPROPION HCL XL 300 MG TAB: 300 | 90 days supply | Qty: 90 | Fill #0

## 2016-04-10 MED FILL — LEVOTHYROXINE 75 MCG TABLET: 75 | 30 days supply | Qty: 30 | Fill #3

## 2016-04-10 MED FILL — CARBIDOPA-LEVO ER 50-200 TA: 50-200 | 30 days supply | Qty: 30 | Fill #0

## 2016-04-13 MED FILL — metFORMIN HCL 1000 MG TABS: 1000 | 30 days supply | Qty: 60 | Fill #0

## 2016-05-08 MED FILL — CARBIDOPA-LEVO ER 50-200 TA: 50-200 | 30 days supply | Qty: 30 | Fill #1

## 2016-05-08 MED FILL — metFORMIN HCL 1000 MG TABS: 1000 | 30 days supply | Qty: 60 | Fill #1

## 2016-05-08 MED FILL — LEVOTHYROXINE 75 MCG TABLET: 75 | 30 days supply | Qty: 30 | Fill #4

## 2016-05-08 MED FILL — TOPIRAMATE 100 MG TABLET: 100 | 90 days supply | Qty: 180 | Fill #1

## 2016-05-08 MED FILL — ARIPiprazole 5 MG TABS: 5 | 90 days supply | Qty: 90 | Fill #1

## 2016-05-15 MED FILL — LORazepam 0.5 MG TABS: 0.5 | 15 days supply | Qty: 15 | Fill #0

## 2016-05-20 DIAGNOSIS — N12 Tubulo-interstitial nephritis, not specified as acute or chronic: Secondary | ICD-10-CM

## 2016-05-20 HISTORY — DX: Tubulo-interstitial nephritis, not specified as acute or chronic: N12

## 2016-06-26 MED FILL — CARBIDOPA-LEVO ER 50-200 TA: 50-200 | 30 days supply | Qty: 30 | Fill #2

## 2016-06-26 MED FILL — LEVOTHYROXINE 75 MCG TABLET: 75 | 30 days supply | Qty: 30 | Fill #5 | Status: TO

## 2016-06-26 MED FILL — CARBIDOPA-LEVODOPA 25-250 T: 25-250 | 30 days supply | Qty: 120 | Fill #2 | Status: TO

## 2016-06-26 MED FILL — VENLAFAXINE HCL ER 75 MG CA: 75 | 30 days supply | Qty: 90 | Fill #1 | Status: TO

## 2016-07-09 MED FILL — CIPROFLOXACIN HCL 500 MG TA: 500 | 10 days supply | Qty: 20 | Fill #0

## 2016-07-09 MED FILL — PROMETHAZINE 12.5 MG TABLET: 12.5 | 6 days supply | Qty: 20 | Fill #0

## 2016-09-18 MED FILL — ARIPiprazole 5 MG TABS: 5 | 90 days supply | Qty: 90 | Fill #2

## 2016-09-18 MED FILL — TOPIRAMATE 100 MG TABLET: 100 | 90 days supply | Qty: 180 | Fill #2

## 2016-09-18 MED FILL — VENLAFAXINE HCL ER 75 MG CA: 75 | 30 days supply | Qty: 90 | Fill #2 | Status: TO

## 2016-10-05 MED FILL — ONDANSETRON ODT 4 MG TABLET: 4 | 3 days supply | Qty: 15 | Fill #0

## 2016-10-05 MED FILL — CARBIDOPA-LEVODOPA 25-250 T: 25-250 | 30 days supply | Qty: 120 | Fill #0

## 2016-10-05 MED FILL — SULFAMETHOXAZOLE/TMP DS TAB: 800-160 | 7 days supply | Qty: 14 | Fill #0

## 2016-10-06 MED FILL — LORazepam 0.5 MG TABS: 0.5 | 15 days supply | Qty: 15 | Fill #0

## 2016-10-06 MED FILL — metFORMIN HCL 1000 MG TABS: 1000 | 30 days supply | Qty: 60 | Fill #0

## 2016-10-06 MED FILL — BUPROPION HCL XL 300 MG TAB: 300 | 30 days supply | Qty: 30 | Fill #0

## 2016-10-06 MED FILL — CARBIDOPA-LEVO ER 50-200 TA: 50-200 | 30 days supply | Qty: 30 | Fill #0

## 2016-10-06 MED FILL — LEVOTHYROXINE 75 MCG TABLET: 75 | 7 days supply | Qty: 7 | Fill #0

## 2016-10-07 MED FILL — LEVOTHYROXINE 88 MCG TABLET: 88 | 30 days supply | Qty: 30 | Fill #0

## 2016-10-26 MED FILL — TOPIRAMATE 100 MG TABLET: 100 | 30 days supply | Qty: 60 | Fill #0

## 2016-10-26 MED FILL — VENLAFAXINE HCL ER 75 MG CA: 75 | 30 days supply | Qty: 90 | Fill #3 | Status: TO

## 2016-11-04 MED FILL — CARBIDOPA-LEVO ER 50-200 TA: 50-200 | 30 days supply | Qty: 30 | Fill #1

## 2016-11-04 MED FILL — BUPROPION HCL XL 300 MG TAB: 300 | 30 days supply | Qty: 30 | Fill #1

## 2016-11-04 MED FILL — CARBIDOPA-LEVODOPA 25-250 T: 25-250 | 30 days supply | Qty: 120 | Fill #1

## 2016-11-04 MED FILL — metFORMIN HCL 1000 MG TABS: 1000 | 30 days supply | Qty: 60 | Fill #1

## 2016-11-27 MED FILL — VENLAFAXINE HCL ER 75 MG CA: 75 | 30 days supply | Qty: 90 | Fill #4 | Status: TO

## 2016-11-27 MED FILL — TOPIRAMATE 100 MG TABLET: 100 | 30 days supply | Qty: 60 | Fill #1 | Status: TO

## 2016-12-04 MED FILL — CARBIDOPA-LEVODOPA 25-250 T: 25-250 | 30 days supply | Qty: 120 | Fill #2

## 2016-12-04 MED FILL — CARBIDOPA-LEVO ER 50-200 TA: 50-200 | 30 days supply | Qty: 30 | Fill #2

## 2016-12-08 MED FILL — BUPROPION HCL XL 300 MG TAB: 300 | 30 days supply | Qty: 30 | Fill #2 | Status: TO

## 2016-12-08 MED FILL — metFORMIN HCL 1000 MG TABS: 1000 | 30 days supply | Qty: 60 | Fill #2 | Status: TO

## 2016-12-18 MED FILL — ARIPiprazole 5 MG TABS: 5 | 30 days supply | Qty: 30 | Fill #0 | Status: TO

## 2017-01-04 MED FILL — CARBIDOPA-LEVODOPA 25-250 T: 25-250 | 30 days supply | Qty: 120 | Fill #0

## 2017-01-04 MED FILL — CARBIDOPA-LEVO ER 50-200 TA: 50-200 | 30 days supply | Qty: 30 | Fill #0

## 2017-01-07 MED FILL — VENLAFAXINE HCL ER 75 MG CA: 75 | 30 days supply | Qty: 90 | Fill #5 | Status: TO

## 2017-01-20 MED FILL — BUPROPION HCL XL 300 MG TAB: 300 | 30 days supply | Qty: 30 | Fill #0

## 2017-01-27 MED FILL — ARIPiprazole 5 MG TABS: 5 | 30 days supply | Qty: 30 | Fill #0

## 2017-01-27 MED FILL — metFORMIN HCL 1000 MG TABS: 1000 | 30 days supply | Qty: 60 | Fill #0

## 2017-01-28 MED FILL — LOSARTAN POTASSIUM 25 MG TA: 25 | 30 days supply | Qty: 30 | Fill #0

## 2017-01-28 MED FILL — METOPROLOL TARTRATE 25 MG T: 25 | 30 days supply | Qty: 30 | Fill #0

## 2017-01-29 MED FILL — LEVOTHYROXINE 75 MCG TABLET: 75 | 90 days supply | Qty: 90 | Fill #0

## 2017-02-04 MED FILL — CARBIDOPA-LEVODOPA 25-250 T: 25-250 | 30 days supply | Qty: 120 | Fill #0

## 2017-02-15 MED FILL — VENLAFAXINE HCL ER 75 MG CA: 75 | 30 days supply | Qty: 90 | Fill #0 | Status: TO

## 2017-02-18 MED FILL — CARBIDOPA-LEVO ER 50-200 TA: 50-200 | 30 days supply | Qty: 30 | Fill #0

## 2017-02-25 MED FILL — TOPIRAMATE 100 MG TABLET: 100 | 30 days supply | Qty: 60 | Fill #0

## 2017-02-25 MED FILL — ARIPiprazole 5 MG TABS: 5 | 30 days supply | Qty: 30 | Fill #1

## 2017-03-01 DIAGNOSIS — E119 Type 2 diabetes mellitus without complications: Secondary | ICD-10-CM | POA: Diagnosis not present

## 2017-03-01 DIAGNOSIS — Z6828 Body mass index (BMI) 28.0-28.9, adult: Secondary | ICD-10-CM | POA: Diagnosis not present

## 2017-03-01 DIAGNOSIS — G479 Sleep disorder, unspecified: Secondary | ICD-10-CM | POA: Diagnosis not present

## 2017-03-01 DIAGNOSIS — Z760 Encounter for issue of repeat prescription: Secondary | ICD-10-CM | POA: Diagnosis not present

## 2017-03-01 DIAGNOSIS — E663 Overweight: Secondary | ICD-10-CM | POA: Diagnosis not present

## 2017-03-01 MED FILL — LOSARTAN POTASSIUM 50 MG TA: 50 | 30 days supply | Qty: 30 | Fill #0

## 2017-03-01 MED FILL — metFORMIN HCL 1000 MG TABS: 1000 | 30 days supply | Qty: 60 | Fill #0

## 2017-03-05 MED FILL — BUPROPION HCL XL 300 MG TAB: 300 | 30 days supply | Qty: 30 | Fill #0

## 2017-03-09 DIAGNOSIS — E663 Overweight: Secondary | ICD-10-CM | POA: Diagnosis not present

## 2017-03-09 DIAGNOSIS — Z6828 Body mass index (BMI) 28.0-28.9, adult: Secondary | ICD-10-CM | POA: Diagnosis not present

## 2017-03-09 DIAGNOSIS — M545 Low back pain: Secondary | ICD-10-CM | POA: Diagnosis not present

## 2017-03-19 MED FILL — CARBIDOPA-LEVODOPA 25-250 T: 25-250 | 30 days supply | Qty: 120 | Fill #0

## 2017-03-19 MED FILL — CARBIDOPA-LEVO ER 50-200 TA: 50-200 | 30 days supply | Qty: 30 | Fill #0

## 2017-03-21 DIAGNOSIS — N10 Acute tubulo-interstitial nephritis: Secondary | ICD-10-CM | POA: Diagnosis not present

## 2017-03-21 DIAGNOSIS — N3000 Acute cystitis without hematuria: Secondary | ICD-10-CM | POA: Diagnosis not present

## 2017-03-22 MED FILL — ARIPiprazole 5 MG TABS: 5 | 30 days supply | Qty: 30 | Fill #2

## 2017-03-22 MED FILL — VENLAFAXINE HCL ER 75 MG CA: 75 | 30 days supply | Qty: 90 | Fill #0

## 2017-03-22 MED FILL — TOPIRAMATE 100 MG TABLET: 100 | 30 days supply | Qty: 60 | Fill #1

## 2017-03-30 MED FILL — metFORMIN HCL 1000 MG TABS: 1000 | 30 days supply | Qty: 60 | Fill #1 | Status: TO

## 2017-04-15 MED FILL — VENLAFAXINE HCL ER 75 MG CA: 75 | 30 days supply | Qty: 90 | Fill #1

## 2017-04-15 MED FILL — CARBIDOPA-LEVO ER 50-200 TA: 50-200 | 30 days supply | Qty: 30 | Fill #1

## 2017-04-15 MED FILL — LOSARTAN POTASSIUM 50 MG TA: 50 | 30 days supply | Qty: 30 | Fill #1

## 2017-04-23 ENCOUNTER — Emergency Department (HOSPITAL_BASED_OUTPATIENT_CLINIC_OR_DEPARTMENT_OTHER)
Admission: EM | Admit: 2017-04-23 | Discharge: 2017-04-23 | Disposition: A | Discharge status: Home / Self Care | Payer: 59 | Attending: Emergency Medicine | Admitting: Emergency Medicine

## 2017-04-23 ENCOUNTER — Encounter (HOSPITAL_BASED_OUTPATIENT_CLINIC_OR_DEPARTMENT_OTHER): Payer: Self-pay | Admitting: Emergency Medicine

## 2017-04-23 DIAGNOSIS — Z7982 Long term (current) use of aspirin: Secondary | ICD-10-CM | POA: Insufficient documentation

## 2017-04-23 DIAGNOSIS — G2401 Drug induced subacute dyskinesia: Secondary | ICD-10-CM | POA: Diagnosis not present

## 2017-04-23 DIAGNOSIS — F17200 Nicotine dependence, unspecified, uncomplicated: Secondary | ICD-10-CM | POA: Insufficient documentation

## 2017-04-23 DIAGNOSIS — E039 Hypothyroidism, unspecified: Secondary | ICD-10-CM | POA: Insufficient documentation

## 2017-04-23 DIAGNOSIS — Z79899 Other long term (current) drug therapy: Secondary | ICD-10-CM | POA: Insufficient documentation

## 2017-04-23 LAB — URINALYSIS, ROUTINE W REFLEX MICROSCOPIC
BILIRUBIN URINE: NEGATIVE
Glucose, UA: NEGATIVE mg/dL
Hgb urine dipstick: NEGATIVE
Ketones, ur: NEGATIVE mg/dL
LEUKOCYTES UA: NEGATIVE
NITRITE: NEGATIVE
PH: 7 (ref 5.0–8.0)
Protein, ur: NEGATIVE mg/dL
Specific Gravity, Urine: 1.003 — ABNORMAL LOW (ref 1.005–1.030)

## 2017-04-23 LAB — I-STAT CHEM 8, ED
BUN: 12 mg/dL (ref 6–20)
Calcium, Ion: 1.11 mmol/L — ABNORMAL LOW (ref 1.15–1.40)
Chloride: 107 mmol/L (ref 101–111)
Creatinine, Ser: 0.9 mg/dL (ref 0.44–1.00)
Glucose, Bld: 216 mg/dL — ABNORMAL HIGH (ref 65–99)
HEMATOCRIT: 40 % (ref 36.0–46.0)
Hemoglobin: 13.6 g/dL (ref 12.0–15.0)
Potassium: 3.8 mmol/L (ref 3.5–5.1)
Sodium: 140 mmol/L (ref 135–145)
TCO2: 19 mmol/L (ref 0–100)

## 2017-04-23 MED ORDER — DIAZEPAM 5 MG PO TABS: 5.0000 mg | ORAL_TABLET | Freq: Once | ORAL | Status: DC

## 2017-04-23 MED ORDER — ONDANSETRON HCL 4 MG/2ML IJ SOLN
4.0000 mg | Freq: Once | INTRAMUSCULAR | Status: AC
  Administered 2017-04-23: 4 mg via INTRAVENOUS
  Filled 2017-04-23: qty 2

## 2017-04-23 MED ORDER — SODIUM CHLORIDE 0.9 % IV BOLUS (SEPSIS)
1000.0000 mL | Freq: Once | INTRAVENOUS | Status: AC
  Administered 2017-04-23: 1000 mL via INTRAVENOUS

## 2017-04-23 MED ORDER — DIAZEPAM 5 MG/ML IJ SOLN
5.0000 mg | Freq: Once | INTRAMUSCULAR | Status: AC
  Administered 2017-04-23: 5 mg via INTRAVENOUS
  Filled 2017-04-23: qty 2

## 2017-04-23 NOTE — Care Management Note (Signed)
Case Management Note  Patient Details  Name: KATHRYNE RAMELLA MRN: [id number] Date of Birth: [birth year]  Subjective/Objective:   56 year old female with medication issue.  CM consulted for [name] and no ins.                 Action/Plan: Attempted to make an appointment with [facility] at [facility] but location is closed today.  Will call next business day, Tues and contact pt with appointment time.  Expected Discharge Date:    04/23/17              Expected Discharge Plan:    Home/Self  In-House Referral:     Discharge [facility] follow up appointment  Post Acute Care Choice:    Choice offered to:   Patient  Status of Service:   In process  Corky Crafts, RN 04/23/2017, 12:06 PM

## 2017-04-23 NOTE — Discharge Instructions (Signed)
It was my pleasure taking care of you today! I hope you feel better soon.   Please discontinue carbidopa-levodopa. You MUST follow up with both your primary care doctor and the neurology clinic. See instructions listed.   Return to ER for new or worsening symptoms, any additional concerns.

## 2017-04-23 NOTE — ED Triage Notes (Addendum)
PT presents with c/o vomiting since last night and jerking all over for over a month.

## 2017-04-23 NOTE — ED Notes (Signed)
ED Provider at bedside. 

## 2017-04-23 NOTE — ED Provider Notes (Signed)
[facility] DEPT [facility] Provider Note   CSN: [id number] Arrival date & time: 04/23/17  0932     History   Chief Complaint Chief Complaint  Patient presents with  . Emesis    HPI Meghan Terry is a 56 y.o. female.  The history is provided by the patient and medical records. No language interpreter was used.  Emesis   Associated symptoms include myalgias. Pertinent negatives include no abdominal pain, no arthralgias, no chills, no diarrhea, no fever and no headaches.   Meghan Terry is a 56 y.o. female  with a PMH of hypothyroid, restless legs syndrome, anxiety who presents to the Emergency Department complaining of progressively worsening jerking / twitching over the last month. Today, jerking was so bad that she could not put her make-up on and she was scared to drive. Patient states these motions are just from the hips up, mostly upper extremities, face and neck. No lower extremity involvement or trouble walking. Symptoms are worse when lying down for a while and when having to concentrate. She took one of her husband's baclofen prior to arrival with no improvement. No alleviating factors noted. No hx of similar. Last night, she began experiencing nausea and had an episode of vomiting. No abdominal pain, diarrhea or constipation. No headaches, dizziness, numbness, tingling. She has had no recent changes to medications. She has been on carbidopa-levodopa for the last 12 years for restless leg syndrome. This medication has controlled symptoms well with no complications.   Past Medical History:  Diagnosis Date  . Anxiety   . Hypothyroid   . RLS (restless legs syndrome)   . Thyroid disease     There are no active problems to display for this patient.   Past Surgical History:  Procedure Laterality Date  . ABDOMINAL HYSTERECTOMY    . APPENDECTOMY    . CHOLECYSTECTOMY    . COLONOSCOPY    . 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 TUBAL LIGATION      OB History    No data available       Home Medications    Prior to Admission medications   Medication Sig Start Date End Date Taking? Authorizing Provider  aspirin EC 325 MG tablet Take 1 tablet (325 mg total) by mouth daily. 11/29/14   Wylene Simmer, MD  buPROPion (WELLBUTRIN) 75 MG tablet Take 75 mg by mouth every morning.    [provider]  levothyroxine (SYNTHROID, LEVOTHROID) 88 MCG tablet Take 88 mcg by mouth daily.    [provider]  oxyCODONE (ROXICODONE) 5 MG immediate release tablet Take 1-2 tablets (5-10 mg total) by mouth every 4 (four) hours as needed for moderate pain or severe pain. 11/29/14   Wylene Simmer, MD  promethazine (PHENERGAN) 12.5 MG tablet Take 1 tablet (12.5 mg total) by mouth every 6 (six) hours as needed for nausea or vomiting. 11/29/14   Wylene Simmer, MD  topiramate (TOPAMAX) 100 MG tablet Take 100 mg by mouth 2 (two) times daily.    [provider]  venlafaxine (EFFEXOR) 75 MG tablet Take 75 mg by mouth 2 (two) times daily.    [provider]    Family History No family history on file.  Social History Social History  Substance Use Topics  . Smoking status: Current  Every Day Smoker  . Smokeless tobacco: Not on file  . Alcohol use No     Allergies   Patient has no known allergies.   Review of Systems Review of Systems  Constitutional: Negative for chills and fever.  Gastrointestinal: Positive for nausea and vomiting. Negative for abdominal pain, constipation and diarrhea.  Musculoskeletal: Positive for myalgias. Negative for arthralgias and joint swelling.  Neurological: Negative for dizziness, syncope, speech difficulty, weakness, numbness and headaches.       + twitching  All other systems reviewed and  are negative.    Physical Exam Updated Vital Signs BP (!) 149/84 (BP Location: Left Arm)   Pulse 84   Temp 98.2 F (36.8 C) (Oral)   Resp 16   Ht 5\' 3"  (1.6 m)   Wt 72.6 kg (160 lb)   SpO2 100%   BMI 28.34 kg/m   Physical Exam  Constitutional: She is oriented to person, place, and time. She appears well-developed and well-nourished. No distress.  HENT:  Head: Normocephalic and atraumatic.  Cardiovascular: Normal rate, regular rhythm and normal heart sounds.   No murmur heard. Pulmonary/Chest: Effort normal and breath sounds normal. No respiratory distress.  Abdominal: Soft. She exhibits no distension. There is no tenderness.  Musculoskeletal: She exhibits no edema.  Neurological: She is alert and oriented to person, place, and time.  Speech clear and goal oriented. CN 2-12 grossly intact. No drift. Intermittent head-bobbing and facial twitching as well as fidgeting of upper extremities that appears involuntary. Strength and sensation intact. Steady gait.   Skin: Skin is warm and dry.  Nursing note and vitals reviewed.    ED Treatments / Results  Labs (all labs ordered are listed, but only abnormal results are displayed) Labs Reviewed  URINALYSIS, ROUTINE W REFLEX MICROSCOPIC - Abnormal; Notable for the following:       Result Value   Specific Gravity, Urine 1.003 (*)    All other components within normal limits  I-STAT CHEM 8, ED - Abnormal; Notable for the following:    Glucose, Bld 216 (*)    Calcium, Ion 1.11 (*)    All other components within normal limits    EKG  EKG Interpretation None       Radiology No results found.  Procedures Procedures (including critical care time)  Medications Ordered in ED Medications  sodium chloride 0.9 % bolus 1,000 mL (0 mLs Intravenous Stopped 04/23/17 1223)  ondansetron (ZOFRAN) injection 4 mg (4 mg Intravenous Given 04/23/17 1000)  diazepam (VALIUM) injection 5 mg (5 mg Intravenous Given 04/23/17 1015)     Initial  Impression / Assessment and Plan / ED Course  I have reviewed the triage vital signs and the nursing notes.  Pertinent labs & imaging results that were available during my care of the patient were reviewed by me and considered in my medical decision making (see chart for details).    Meghan Terry is a 56 y.o. female who presents to ED for progressively worsening twitching to face, head-bobbing and bilateral upper extremity jerking. Appears involuntary on exam. No lower extremity involvement. She is on carbidopa-levodopa which she has been on for the last 12 years for her restless leg syndrome. No headaches and CN 2-12 intact. Given fluids, zofran and valium and will discuss case with neurology.   [facility], Dr. Rory Percy, who states that symptoms are c/w medication induced dyskinesia. Recommends discontinuing carbidopa-levodopa and having patient follow up with neurology as outpatient. Patient feels better on re-evaluation. Discussed  importance of d/c medication and neuro follow up. She recently lost PCP as clinic closed and I believe she will also benefit from close PCP appointment, therefore case management was consulted and will arrange follow up appointment. Reasons to return to ER discussed with patient and children at length. All questions answered.   Patient discussed with Dr. Dayna Barker who agrees with treatment plan.   Final Clinical Impressions(s) / ED Diagnoses   Final diagnoses:  Drug-induced dyskinesia    New Prescriptions Discharge Medication List as of 04/23/2017 12:21 PM       Jessi Jessop, Ozella Almond, PA-C 04/23/17 1317    Mesner, Corene Cornea, MD 04/24/17 [id number]

## 2017-04-25 ENCOUNTER — Encounter (HOSPITAL_COMMUNITY): Payer: Self-pay | Admitting: Emergency Medicine

## 2017-04-25 ENCOUNTER — Observation Stay (HOSPITAL_COMMUNITY)
Admission: EM | Admit: 2017-04-25 | Discharge: 2017-04-26 | Disposition: A | Discharge status: Home / Self Care | Payer: Self-pay | Attending: Internal Medicine | Admitting: Internal Medicine

## 2017-04-25 DIAGNOSIS — I10 Essential (primary) hypertension: Secondary | ICD-10-CM | POA: Insufficient documentation

## 2017-04-25 DIAGNOSIS — F329 Major depressive disorder, single episode, unspecified: Secondary | ICD-10-CM | POA: Insufficient documentation

## 2017-04-25 DIAGNOSIS — G43909 Migraine, unspecified, not intractable, without status migrainosus: Secondary | ICD-10-CM

## 2017-04-25 DIAGNOSIS — T428X5A Adverse effect of antiparkinsonism drugs and other central muscle-tone depressants, initial encounter: Secondary | ICD-10-CM | POA: Insufficient documentation

## 2017-04-25 DIAGNOSIS — F32A Depression, unspecified: Secondary | ICD-10-CM

## 2017-04-25 DIAGNOSIS — Z7982 Long term (current) use of aspirin: Secondary | ICD-10-CM | POA: Insufficient documentation

## 2017-04-25 DIAGNOSIS — F419 Anxiety disorder, unspecified: Secondary | ICD-10-CM | POA: Insufficient documentation

## 2017-04-25 DIAGNOSIS — Z794 Long term (current) use of insulin: Secondary | ICD-10-CM | POA: Insufficient documentation

## 2017-04-25 DIAGNOSIS — Z9049 Acquired absence of other specified parts of digestive tract: Secondary | ICD-10-CM | POA: Insufficient documentation

## 2017-04-25 DIAGNOSIS — G2401 Drug induced subacute dyskinesia: Secondary | ICD-10-CM

## 2017-04-25 DIAGNOSIS — R1110 Vomiting, unspecified: Secondary | ICD-10-CM | POA: Insufficient documentation

## 2017-04-25 DIAGNOSIS — G2581 Restless legs syndrome: Secondary | ICD-10-CM | POA: Insufficient documentation

## 2017-04-25 DIAGNOSIS — G259 Extrapyramidal and movement disorder, unspecified: Secondary | ICD-10-CM

## 2017-04-25 DIAGNOSIS — G253 Myoclonus: Principal | ICD-10-CM

## 2017-04-25 DIAGNOSIS — E119 Type 2 diabetes mellitus without complications: Secondary | ICD-10-CM | POA: Insufficient documentation

## 2017-04-25 DIAGNOSIS — R739 Hyperglycemia, unspecified: Secondary | ICD-10-CM

## 2017-04-25 DIAGNOSIS — E039 Hypothyroidism, unspecified: Secondary | ICD-10-CM

## 2017-04-25 DIAGNOSIS — Z90710 Acquired absence of both cervix and uterus: Secondary | ICD-10-CM | POA: Insufficient documentation

## 2017-04-25 DIAGNOSIS — R569 Unspecified convulsions: Secondary | ICD-10-CM

## 2017-04-25 DIAGNOSIS — F17200 Nicotine dependence, unspecified, uncomplicated: Secondary | ICD-10-CM | POA: Insufficient documentation

## 2017-04-25 DIAGNOSIS — Y929 Unspecified place or not applicable: Secondary | ICD-10-CM | POA: Insufficient documentation

## 2017-04-25 DIAGNOSIS — Z79899 Other long term (current) drug therapy: Secondary | ICD-10-CM | POA: Insufficient documentation

## 2017-04-25 HISTORY — DX: Migraine, unspecified, not intractable, without status migrainosus: G43.909

## 2017-04-25 HISTORY — DX: Myoclonus: G25.3

## 2017-04-25 HISTORY — DX: Depression, unspecified: F32.A

## 2017-04-25 LAB — COMPREHENSIVE METABOLIC PANEL
ALBUMIN: 3.8 g/dL (ref 3.5–5.0)
ALK PHOS: 85 U/L (ref 38–126)
ALT: 47 U/L (ref 14–54)
ANION GAP: 9 (ref 5–15)
AST: 45 U/L — ABNORMAL HIGH (ref 15–41)
BUN: 8 mg/dL (ref 6–20)
CALCIUM: 9.3 mg/dL (ref 8.9–10.3)
CHLORIDE: 108 mmol/L (ref 101–111)
CO2: 23 mmol/L (ref 22–32)
Creatinine, Ser: 0.93 mg/dL (ref 0.44–1.00)
GFR calc Af Amer: >60 mL/min (ref >60)
GFR calc non Af Amer: >60 mL/min (ref >60)
GLUCOSE: 200 mg/dL — AB (ref 65–99)
POTASSIUM: 3.6 mmol/L (ref 3.5–5.1)
SODIUM: 140 mmol/L (ref 135–145)
Total Bilirubin: 0.7 mg/dL (ref 0.3–1.2)
Total Protein: 7.2 g/dL (ref 6.5–8.1)

## 2017-04-25 LAB — CBC
HCT: 40.4 % (ref 36.0–46.0)
Hemoglobin: 13.7 g/dL (ref 12.0–15.0)
MCH: 29.8 pg (ref 26.0–34.0)
MCHC: 33.9 g/dL (ref 30.0–36.0)
MCV: 87.8 fL (ref 78.0–100.0)
Platelets: 360 10*3/uL (ref 150–400)
RBC: 4.6 MIL/uL (ref 3.87–5.11)
RDW: 12.8 % (ref 11.5–15.5)
WBC: 13.8 10*3/uL — ABNORMAL HIGH (ref 4.0–10.5)

## 2017-04-25 LAB — GLUCOSE, CAPILLARY: GLUCOSE-CAPILLARY: 159 mg/dL — AB (ref 65–99)

## 2017-04-25 LAB — LIPASE, BLOOD: Lipase: 44 U/L (ref 11–51)

## 2017-04-25 MED ORDER — ENOXAPARIN SODIUM 40 MG/0.4ML ~~LOC~~ SOLN
40.0000 mg | SUBCUTANEOUS | Status: DC
  Administered 2017-04-25: 40 mg via SUBCUTANEOUS
  Filled 2017-04-25: qty 0.4

## 2017-04-25 MED ORDER — KETOROLAC TROMETHAMINE 30 MG/ML IJ SOLN
30.0000 mg | Freq: Once | INTRAMUSCULAR | Status: AC
  Administered 2017-04-25: 30 mg via INTRAVENOUS
  Filled 2017-04-25: qty 1

## 2017-04-25 MED ORDER — ROPINIROLE HCL 0.25 MG PO TABS
0.2500 mg | ORAL_TABLET | Freq: Three times a day (TID) | ORAL | Status: DC
  Administered 2017-04-25 – 2017-04-26 (×2): 0.25 mg via ORAL
  Filled 2017-04-25 (×4): qty 1

## 2017-04-25 MED ORDER — DIPHENHYDRAMINE HCL 50 MG/ML IJ SOLN
25.0000 mg | Freq: Once | INTRAMUSCULAR | Status: AC
  Administered 2017-04-25: 25 mg via INTRAVENOUS
  Filled 2017-04-25: qty 1

## 2017-04-25 MED ORDER — ONDANSETRON HCL 4 MG/2ML IJ SOLN
4.0000 mg | Freq: Four times a day (QID) | INTRAMUSCULAR | Status: DC
  Administered 2017-04-26 (×3): 4 mg via INTRAVENOUS
  Filled 2017-04-25 (×3): qty 2

## 2017-04-25 MED ORDER — LEVOTHYROXINE SODIUM 75 MCG PO TABS
75.0000 ug | ORAL_TABLET | Freq: Every day | ORAL | Status: DC
  Administered 2017-04-26: 75 ug via ORAL
  Filled 2017-04-25: qty 1

## 2017-04-25 MED ORDER — TOPIRAMATE 25 MG PO TABS
100.0000 mg | ORAL_TABLET | Freq: Two times a day (BID) | ORAL | Status: DC
  Administered 2017-04-25 – 2017-04-26 (×2): 100 mg via ORAL
  Filled 2017-04-25 (×3): qty 4

## 2017-04-25 MED ORDER — DIAZEPAM 2 MG PO TABS: 2.0000 mg | ORAL_TABLET | Freq: Four times a day (QID) | ORAL | Status: DC

## 2017-04-25 MED ORDER — ACETAMINOPHEN 325 MG PO TABS: 650.0000 mg | ORAL_TABLET | Freq: Four times a day (QID) | ORAL | Status: DC | PRN

## 2017-04-25 MED ORDER — METOCLOPRAMIDE HCL 5 MG/ML IJ SOLN
10.0000 mg | Freq: Once | INTRAMUSCULAR | Status: AC
  Administered 2017-04-25: 10 mg via INTRAVENOUS
  Filled 2017-04-25: qty 2

## 2017-04-25 MED ORDER — ACETAMINOPHEN 650 MG RE SUPP: 650.0000 mg | Freq: Four times a day (QID) | RECTAL | Status: DC | PRN

## 2017-04-25 MED ORDER — LEVOTHYROXINE SODIUM 88 MCG PO TABS: 88.0000 ug | ORAL_TABLET | Freq: Every day | ORAL | Status: DC

## 2017-04-25 MED ORDER — INSULIN ASPART 100 UNIT/ML ~~LOC~~ SOLN: 0.0000 [IU] | Freq: Every day | SUBCUTANEOUS | Status: DC

## 2017-04-25 MED ORDER — SODIUM CHLORIDE 0.9 % IV BOLUS (SEPSIS)
1000.0000 mL | Freq: Once | INTRAVENOUS | Status: AC
  Administered 2017-04-25: 1000 mL via INTRAVENOUS

## 2017-04-25 MED ORDER — MAGNESIUM SULFATE 2 GM/50ML IV SOLN
2.0000 g | Freq: Once | INTRAVENOUS | Status: AC
  Administered 2017-04-25: 2 g via INTRAVENOUS
  Filled 2017-04-25: qty 50

## 2017-04-25 MED ORDER — SODIUM CHLORIDE 0.9 % IV SOLN
INTRAVENOUS | Status: DC
  Administered 2017-04-25: 21:00:00 via INTRAVENOUS

## 2017-04-25 MED ORDER — BUPROPION HCL ER (XL) 150 MG PO TB24
300.0000 mg | ORAL_TABLET | Freq: Every day | ORAL | Status: DC
  Administered 2017-04-26: 300 mg via ORAL
  Filled 2017-04-25: qty 2

## 2017-04-25 MED ORDER — DIAZEPAM 2 MG PO TABS
2.0000 mg | ORAL_TABLET | Freq: Four times a day (QID) | ORAL | Status: DC | PRN
  Administered 2017-04-25: 2 mg via ORAL
  Filled 2017-04-25: qty 1

## 2017-04-25 MED ORDER — BUPROPION HCL 75 MG PO TABS: 75.0000 mg | ORAL_TABLET | Freq: Every morning | ORAL | Status: DC

## 2017-04-25 MED ORDER — IBUPROFEN 400 MG PO TABS: 400.0000 mg | ORAL_TABLET | ORAL | Status: DC | PRN

## 2017-04-25 MED ORDER — INSULIN ASPART 100 UNIT/ML ~~LOC~~ SOLN
0.0000 [IU] | Freq: Three times a day (TID) | SUBCUTANEOUS | Status: DC
  Administered 2017-04-26: 1 [IU] via SUBCUTANEOUS

## 2017-04-25 NOTE — ED Provider Notes (Signed)
[facility] DEPT Provider Note   CSN: [id number] Arrival date & time: 04/25/17  1414     History   Chief Complaint Chief Complaint  Patient presents with  . Migraine    sensitivity to light  . jerking  . Depression  . Emesis    HPI Meghan Terry is a 56 y.o. female.  HPI  56 y.o. female with a hx of RLS, presents to the Emergency Department today due to uncontrollable jerking of upper body as well as migraine with sensitivity of light. Pt states that she was seen yesterday at [facility] for same. Per chart, this was likely drug induced dyskinesia 2/2 carbidopa-levodopa. Pt encouraged to DC medication and follow up with Neurology as outpatient. Neurology was consulted for case. Pt was sent home on Valium and states this medication did not help and she is just sleeping. Pt states that she has been very hot and her migraine feels like a "scrambled feeling in my head." No N/V. No weakness. No unilateral weakness or numbness. No Cp/SOB/ABD pain. No symptoms currently. Mild headache that is tension like. Rates pain 6/10. No other symptoms noted.   Past Medical History:  Diagnosis Date  . Anxiety   . Hypothyroid   . RLS (restless legs syndrome)   . Thyroid disease     There are no active problems to display for this patient.   Past Surgical History:  Procedure Laterality Date  . ABDOMINAL HYSTERECTOMY    . APPENDECTOMY    . CHOLECYSTECTOMY    . COLONOSCOPY    . 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 TUBAL LIGATION      OB History    No data available       Home Medications    Prior to Admission medications   Medication Sig Start Date End Date Taking? Authorizing Provider  aspirin EC 325 MG tablet Take 1 tablet (325 mg  total) by mouth daily. 11/29/14   Wylene Simmer, MD  buPROPion (WELLBUTRIN) 75 MG tablet Take 75 mg by mouth every morning.    [provider]  levothyroxine (SYNTHROID, LEVOTHROID) 88 MCG tablet Take 88 mcg by mouth daily.    [provider]  oxyCODONE (ROXICODONE) 5 MG immediate release tablet Take 1-2 tablets (5-10 mg total) by mouth every 4 (four) hours as needed for moderate pain or severe pain. 11/29/14   Wylene Simmer, MD  promethazine (PHENERGAN) 12.5 MG tablet Take 1 tablet (12.5 mg total) by mouth every 6 (six) hours as needed for nausea or vomiting. 11/29/14   Wylene Simmer, MD  topiramate (TOPAMAX) 100 MG tablet Take 100 mg by mouth 2 (two) times daily.    [provider]  venlafaxine (EFFEXOR) 75 MG tablet Take 75 mg by mouth 2 (two) times daily.    [provider]    Family History No family history on file.  Social History Social History  Substance Use Topics  . Smoking status: Current Every Day Smoker  . Smokeless tobacco: Not on file  . Alcohol use No     Allergies   Patient has no known allergies.   Review of Systems Review of Systems ROS reviewed and all are negative for acute change except as noted in the HPI.  Physical Exam Updated Vital Signs BP Marland Kitchen)  147/70   Pulse 89   Temp 98 F (36.7 C)   Resp 18   SpO2 99%   Physical Exam  Constitutional: She is oriented to person, place, and time. She appears well-developed and well-nourished. No distress.  HENT:  Head: Normocephalic and atraumatic.  Right Ear: Tympanic membrane, external ear and ear canal normal.  Left Ear: Tympanic membrane, external ear and ear canal normal.  Nose: Nose normal.  Mouth/Throat: Uvula is midline, oropharynx is clear and moist and mucous membranes are normal. No trismus in the jaw. No oropharyngeal exudate, posterior oropharyngeal erythema or tonsillar abscesses.  Eyes: Pupils are equal, round, and reactive to light. EOM are normal.  Neck: Normal range  of motion. Neck supple. No tracheal deviation present.  Cardiovascular: Normal rate, regular rhythm, S1 normal, S2 normal, normal heart sounds, intact distal pulses and normal pulses.   Pulmonary/Chest: Effort normal and breath sounds normal. No respiratory distress. She has no decreased breath sounds. She has no wheezes. She has no rhonchi. She has no rales.  Abdominal: Normal appearance and bowel sounds are normal. There is no tenderness.  Musculoskeletal: Normal range of motion.  Neurological: She is alert and oriented to person, place, and time. She has normal strength. No cranial nerve deficit or sensory deficit.  Cranial Nerves:  II: Pupils equal, round, reactive to light III,IV, VI: ptosis not present, extra-ocular motions intact bilaterally  V,VII: smile symmetric, facial light touch sensation equal VIII: hearing grossly normal bilaterally  IX,X: midline uvula rise  XI: bilateral shoulder shrug equal and strong XII: midline tongue extension  Skin: Skin is warm and dry.  Psychiatric: She has a normal mood and affect. Her speech is normal and behavior is normal. Thought content normal.    ED Treatments / Results  Labs (all labs ordered are listed, but only abnormal results are displayed) Labs Reviewed  COMPREHENSIVE METABOLIC PANEL - Abnormal; Notable for the following:       Result Value   Glucose, Bld 200 (*)    AST 45 (*)    All other components within normal limits  CBC - Abnormal; Notable for the following:    WBC 13.8 (*)    All other components within normal limits  LIPASE, BLOOD    EKG  EKG Interpretation None       Radiology No results found.  Procedures Procedures (including critical care time)  Medications Ordered in ED Medications - No data to display   Initial Impression / Assessment and Plan / ED Course  I have reviewed the triage vital signs and the nursing notes.  Pertinent labs & imaging results that were available during my care of the  patient were reviewed by me and considered in my medical decision making (see chart for details).  Final Clinical Impressions(s) / ED Diagnoses  {I have reviewed and evaluated the relevant laboratory values.   {I have reviewed the relevant previous healthcare records.  {I obtained HPI from historian. {Patient discussed with supervising physician.  ED Course:  Assessment: Pt is a 56 y.o. female with a hx of RLS, presents to the Emergency Department today due to uncontrollable jerking of upper body as well as migraine with sensitivity of light. Pt states that she was seen yesterday at [facility] for same. Per chart, this was likely drug induced dyskinesia 2/2 carbidopa-levodopa. Pt encouraged to DC medication and follow up with Neurology as outpatient. Neurology was consulted for case. Pt was sent home on Valium and states this  medication did not help and she is just sleeping. Pt states that she has been very hot and her migraine feels like a "scrambled feeling in my head." No N/V. No weakness. No unilateral weakness or numbness. No CP/SOB/ABD pain. No symptoms currently. Mild headache that is tension like. Rates pain 6/10.  On exam, pt in NAD. Nontoxic/nonseptic appearing. VSS. Afebrile. Lungs CTA. Heart RRR. CN evaluated and unremarakble. Labs unremarkable. Consulted Neurology (Dr. Rory Percy). Will see in ED. Given Migraine cocktail. Appreciate neurology input in ED. Recommended Obs admission for Seizure rule out. MRI in AM as well as EEG. Low dose Valium PRN. See further recommendations on Consult note  Disposition/Plan:  Admit Pt acknowledges and agrees with plan  Supervising Physician Mesner, Corene Cornea, MD  Final diagnoses:  Drug-induced dyskinesia  Seizure-like activity [facility])    New Prescriptions New Prescriptions   No medications on file     Shary Decamp, Hershal Coria 04/25/17 1742    Mesner, Corene Cornea, MD 04/26/17 [phone]

## 2017-04-25 NOTE — H&P (Signed)
History and Physical    Meghan Terry WNI:[id number] DOB: Dec 02, 1960 DOA: 04/25/2017    PCP: Garwin Brothers, MD  Patient coming from: home  Chief Complaint: upper body spasms, migraine  HPI: Meghan Terry is a 56 y.o. female with medical history of migraines, depression, hypothyroidism RLS on Sinemet presented to [facility] yesterday for spasms of upper body susepected to be due to Sinemet which was discontinued. She was asked to take Valium PRN but this did not help and she therefore returns. She also has a Migraine with light sensitivity.  She further tells me that she had vomiting on Thursday and Friday. Since then she has stopped eating solid food and has stopped vomiting. No abdominal pain and no diarrhea. No fever. Vomitus was mostly bilious and non bloody.   ED Course: Benadryl, Reglan and 1 L NS given in ER Neurology consulted  Review of Systems:  All other systems reviewed and apart from HPI, are negative.  Past Medical History:  Diagnosis Date  . Anxiety   . Hypothyroid   . RLS (restless legs syndrome)   . Thyroid disease     Past Surgical History:  Procedure Laterality Date  . ABDOMINAL HYSTERECTOMY    . APPENDECTOMY    . CHOLECYSTECTOMY    . COLONOSCOPY    . OSTEOCHONDRAL DEFECT REPAIR/RECONSTRUCTION Left 11/29/2014   Procedure: LEFT ANKLE MEDIAL MALLEOLUS OSTEOTOMY,AUTO GRAFT FROM CALCANEOUS;  OS TIBIA DENORO GRAFTING TALUS;  Surgeon: Wylene Simmer, MD;  Location: [place];  Service: Orthopedics;  Laterality: Left;  . TUBAL LIGATION      Social History:   reports that she has been smoking.  She does not have any smokeless tobacco history on file. She reports that she does not drink alcohol or use drugs.  No Known Allergies  No family history on file.   Prior to Admission medications   Medication Sig Start Date End Date Taking? Authorizing Provider  aspirin EC 325 MG tablet Take 1 tablet (325 mg total) by mouth daily. 11/29/14   Wylene Simmer, MD  buPROPion  (WELLBUTRIN) 75 MG tablet Take 75 mg by mouth every morning.    [provider]  levothyroxine (SYNTHROID, LEVOTHROID) 88 MCG tablet Take 88 mcg by mouth daily.    [provider]  oxyCODONE (ROXICODONE) 5 MG immediate release tablet Take 1-2 tablets (5-10 mg total) by mouth every 4 (four) hours as needed for moderate pain or severe pain. 11/29/14   Wylene Simmer, MD  promethazine (PHENERGAN) 12.5 MG tablet Take 1 tablet (12.5 mg total) by mouth every 6 (six) hours as needed for nausea or vomiting. 11/29/14   Wylene Simmer, MD  topiramate (TOPAMAX) 100 MG tablet Take 100 mg by mouth 2 (two) times daily.    [provider]  venlafaxine (EFFEXOR) 75 MG tablet Take 75 mg by mouth 2 (two) times daily.    [provider]    Physical Exam: Wt Readings from Last 3 Encounters:  04/23/17 72.6 kg (160 lb)  11/29/14 76.9 kg (169 lb 8 oz)  09/17/14 76.2 kg (168 lb)   Vitals:   04/25/17 1600 04/25/17 1615 04/25/17 1700 04/25/17 1715  BP: (!) 142/77 (!) 147/70 (!) 158/90 (!) 157/84  Pulse: 98 89 95 94  Resp:      Temp:      SpO2: 100% 99% 100% 100%      Constitutional: NAD, calm, comfortable Eyes: PERTLA, lids and conjunctivae normal ENMT: Mucous membranes are moist. Posterior pharynx clear of  any exudate or lesions. Normal dentition.  Neck: normal, supple, no masses, no thyromegaly Respiratory: clear to auscultation bilaterally, no wheezing, no crackles. Normal respiratory effort. No accessory muscle use.  Cardiovascular: S1 & S2 heard, regular rate and rhythm, no murmurs / rubs / gallops. No extremity edema. 2+ pedal pulses. No carotid bruits.  Abdomen: No distension, no tenderness, no masses palpated. No hepatosplenomegaly. Bowel sounds normal.  Musculoskeletal: no clubbing / cyanosis. No joint deformity upper and lower extremities. Good ROM, no contractures. Normal muscle tone.  Skin: no rashes, lesions, ulcers. No induration Neurologic: CN 2-12 grossly intact.  Sensation intact, DTR normal. Strength 5/5 in all 4 limbs.  Psychiatric: Normal judgment and insight. Alert and oriented x 3. Normal mood.     Labs on Admission: I have personally reviewed following labs and imaging studies  CBC:  Recent Labs Lab 04/23/17 1010 04/25/17 1430  WBC  --  13.8*  HGB 13.6 13.7  HCT 40.0 40.4  MCV  --  87.8  PLT  --  627   Basic Metabolic Panel:  Recent Labs Lab 04/23/17 1010 04/25/17 1430  NA 140 140  K 3.8 3.6  CL 107 108  CO2  --  23  GLUCOSE 216* 200*  BUN 12 8  CREATININE 0.90 0.93  CALCIUM  --  9.3   GFR: Estimated Creatinine Clearance: 63 mL/min (by C-G formula based on SCr of 0.93 mg/dL). Liver Function Tests:  Recent Labs Lab 04/25/17 1430  AST 45*  ALT 47  ALKPHOS 85  BILITOT 0.7  PROT 7.2  ALBUMIN 3.8    Recent Labs Lab 04/25/17 1430  LIPASE 44   No results for input(s): AMMONIA in the last 168 hours. Coagulation Profile: No results for input(s): INR, PROTIME in the last 168 hours. Cardiac Enzymes: No results for input(s): CKTOTAL, CKMB, CKMBINDEX, TROPONINI in the last 168 hours. BNP (last 3 results) No results for input(s): PROBNP in the last 8760 hours. HbA1C: No results for input(s): HGBA1C in the last 72 hours. CBG: No results for input(s): GLUCAP in the last 168 hours. Lipid Profile: No results for input(s): CHOL, HDL, LDLCALC, TRIG, CHOLHDL, LDLDIRECT in the last 72 hours. Thyroid Function Tests: No results for input(s): TSH, T4TOTAL, FREET4, T3FREE, THYROIDAB in the last 72 hours. Anemia Panel: No results for input(s): VITAMINB12, FOLATE, FERRITIN, TIBC, IRON, RETICCTPCT in the last 72 hours. Urine analysis:    Component Value Date/Time   COLORURINE YELLOW 04/23/2017 1112   APPEARANCEUR CLEAR 04/23/2017 1112   LABSPEC 1.003 (L) 04/23/2017 1112   PHURINE 7.0 04/23/2017 1112   GLUCOSEU NEGATIVE 04/23/2017 1112   HGBUR NEGATIVE 04/23/2017 1112   BILIRUBINUR NEGATIVE 04/23/2017 1112   KETONESUR  NEGATIVE 04/23/2017 1112   PROTEINUR NEGATIVE 04/23/2017 1112   NITRITE NEGATIVE 04/23/2017 1112   LEUKOCYTESUR NEGATIVE 04/23/2017 1112   Sepsis Labs: @LABRCNTIP (procalcitonin:4,lacticidven:4) )No results found for this or any previous visit (from the past 240 hour(s)).   Radiological Exams on Admission: No results found.  EKG: Independently reviewed.    Assessment/Plan Principal Problem:   Myoclonic jerking - follow Neuro recommendations- stop Effexor and Sinemet- start Requip - PRN Valium- 5mg  made her somnolent- will give 2 mg Q 6 PRN - EEG and MRI per Neuro  Active Problems:   Migraine - cont Topamax- given Reglan and Benadryl in ER - she state Ibuprofen works for her  - will order a one time dose of Toradol and Mg+ - Ibuprofen PRN if she needs meds overnight -avoid  narcotics  Vomiting - does not seem to be related to the Migraine per patient Zofran QID - slow IVF - clear liquids- advance as tolerated    Hypothyroid - cont Synthroid    Depression - cont Wellbutrin- hold Effexor  DM2 - on Metformin- will hold for now- start ISS  HTN - she cannot recall the dose of Cozaar and med rec is not yet complete- will need to f/u Med rec - use Hydralazine PRN     DVT prophylaxis: Lovenox  Code Status: full code  Family Communication:   Disposition Plan: med-surg Consults called: Neurology  Admission status: observation    [name] MD [facility] Pager: [email or web address] Password [email or web address] 7PM-7AM, please contact night-coverage   04/25/2017, 6:11 PM

## 2017-04-25 NOTE — ED Notes (Signed)
Patient provided update. RN ordered patient dinner.

## 2017-04-25 NOTE — ED Notes (Signed)
Pt made aware of bed assignment 

## 2017-04-25 NOTE — ED Triage Notes (Addendum)
Pt reports seen at med  center for vomiting, uncontrollable jerking to upper body, and migraine with sensitivity to light. Given Valium at med center that did not help, just made her sleep.  Pt reports being very hot. Pt states migraine is more like a "scrambled feeling in her head."  Pt is not dizzy.

## 2017-04-25 NOTE — Consult Note (Signed)
Neurology Consultation  Reason for Consult: abnormal movements Referring Physician: Dr. Loma Messing. Mohr PA  CC: Uncontrollable jerking movements of upper body  History is obtained from: Patient, patient's son and chart  HPI: Meghan Terry is a 56 y.o. female was a past medical history of restless leg syndrome, anxiety, depression and hypothyroidism, who presented to [facility] for evaluation of abnormal movements that she's been experiencing for the last 1 month. She presented to the emergency room at [address] [facility] a couple of days ago for similar problems. She her case was discussed with me over the phone and it was deemed that these might be dyskinetic movements secondary to carbidopa levodopa which she takes for restless leg. Today, she describes that her movements have not gone away. She did not discontinue carbidopa levodopa as had been suggested 2 days ago because that makes it very difficult for her to sleep because of her restless legs. Her son reported that she experienced these movements which she demonstrated with head movements to the front with tongue protruding, which would only happen when she is looking at her phone sometimes. 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 She was very anxious at the time of the interview and wanted answers to her problem and her problem solve before she could be discharged to go home.   ROS: A 14 point ROS was performed and is negative except as noted in the HPI.    Past Medical History:  Diagnosis Date  . Anxiety   . Hypothyroid   . RLS (restless legs syndrome)   . Thyroid disease      No family history on file.  Social History:   reports that she has been smoking.  She does not have any smokeless tobacco history on file. She reports  that she does not drink alcohol or use drugs.  Medications  No current facility-administered medications for this encounter.   Current Outpatient Prescriptions:  .  aspirin EC 325 MG tablet, Take 1 tablet (325 mg total) by mouth daily., Disp: 42 tablet, Rfl: 0 .  buPROPion (WELLBUTRIN) 75 MG tablet, Take 75 mg by mouth every morning., Disp: , Rfl:  .  levothyroxine (SYNTHROID, LEVOTHROID) 88 MCG tablet, Take 88 mcg by mouth daily., Disp: , Rfl:  .  oxyCODONE (ROXICODONE) 5 MG immediate release tablet, Take 1-2 tablets (5-10 mg total) by mouth every 4 (four) hours as needed for moderate pain or severe pain., Disp: 30 tablet, Rfl: 0 .  promethazine (PHENERGAN) 12.5 MG tablet, Take 1 tablet (12.5 mg total) by mouth every 6 (six) hours as needed for nausea or vomiting., Disp: 30 tablet, Rfl: 0 .  topiramate (TOPAMAX) 100 MG tablet, Take 100 mg by mouth 2 (two) times daily., Disp: , Rfl:  .  venlafaxine (EFFEXOR) 75 MG tablet, Take 75 mg by mouth 2 (two) times daily., Disp: , Rfl:   Exam: Current vital signs: BP (!) 157/84   Pulse 94   Temp 98 F (36.7 C)   Resp 18   SpO2 100%  Vital signs in last 24 hours: Temp:  [98 F (36.7 C)] 98 F (36.7 C) (07/29 1423) Pulse Rate:  [89-115] 94 (07/29 1715) Resp:  [18] 18 (07/29 1423) BP: (142-184)/(70-92) 157/84 (07/29 1715) SpO2:  [  99 %-100 %] 100 % (07/29 1715)  GENERAL: Awake, alert in NAD HEENT: - Normocephalic and atraumatic, dry mm, no LN++, no Thyromegally LUNGS - Clear to auscultation bilaterally with no wheezes CV - S1S2 RRR, no m/r/g, equal pulses bilaterally. ABDOMEN - Soft, nontender, nondistended with normoactive BS Ext: warm, well perfused, intact peripheral pulses, __ edema  NEURO:  Mental Status: AA&Ox3  Language: speech is non-dysarthric.  Naming, repetition, fluency, and comprehension intact. Cranial Nerves: PERRL. EOMI, visual fields full, no facial asymmetryfacial sensation intact, hearing intact, tongue/uvula/soft  palate midline, normal sternocleidomastoid and trapezius muscle strength. No evidence of tongue atrophy or fibrillations Motor: 5/5 all over with normal tone and range of motion. Tone: is normal and bulk is normal Sensation- Intact to light touch bilaterally Coordination: FTN intact bilaterally, no ataxia in BLE. Gait- deferred Deep tendon reflexes-normal DTRs in the upper extremity, normal 2+ in the lower extremity. She did do a jerky movement as I tapped her knees to check her reflexes and said that she has a very funny feeling when her knees are tested. No dyskinetic movements were noted during the interview.  Labs CBC    Component Value Date/Time   WBC 13.8 (H) 04/25/2017 1430   RBC 4.60 04/25/2017 1430   HGB 13.7 04/25/2017 1430   HCT 40.4 04/25/2017 1430   PLT 360 04/25/2017 1430   MCV 87.8 04/25/2017 1430   MCH 29.8 04/25/2017 1430   MCHC 33.9 04/25/2017 1430   RDW 12.8 04/25/2017 1430   LYMPHSABS 3.4 10/28/2007 1051   MONOABS 0.5 10/28/2007 1051   EOSABS 0.2 10/28/2007 1051   BASOSABS 0.1 10/28/2007 1051    CMP     Component Value Date/Time   NA 140 04/25/2017 1430   K 3.6 04/25/2017 1430   CL 108 04/25/2017 1430   CO2 23 04/25/2017 1430   GLUCOSE 200 (H) 04/25/2017 1430   BUN 8 04/25/2017 1430   CREATININE 0.93 04/25/2017 1430   CALCIUM 9.3 04/25/2017 1430   PROT 7.2 04/25/2017 1430   ALBUMIN 3.8 04/25/2017 1430   AST 45 (H) 04/25/2017 1430   ALT 47 04/25/2017 1430   ALKPHOS 85 04/25/2017 1430   BILITOT 0.7 04/25/2017 1430   GFRNONAA >60 04/25/2017 1430   GFRAA >60 04/25/2017 1430    Imaging No brain imaging  Assessment: 56 year old woman with a past medical history of restless leg syndrome on Sinemet for many years, anxiety, depression and hypothyroidism and migraines, presents for evaluation of some abnormal movements that she notices off and on. She says that these movements of been bothersome and preventative from doing activities of daily living at  times. The son reported that her movements look like she has had popping to the front with tongue sticking out, in a very non-localizable and nonorganic kind of pattern. It is my impression, that this might be conversion disorder. But given the fact that she is on multiple antidepressants along with Sinemet, she could have some form of dyskinesias that might be affecting her. She is very anxious and does not want to be discharged at this time as she feels that her symptoms are debilitating.  There is no report of rhythmic jerking or shaking-like movements, no report of bowel or bladder incontinence but the son was concerned for these stereotypical kind of movements that she has while standing on her phone.  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  # I would recommend an observation admission overnight to the hospitalist   # A routine EEG in the morning can be obtained. If the EEG is not obtained and the patient is ready for discharge medically, EEG can also be done as an outpatient.  # As far as her medications are concerned, I would recommend the following: -Discontinue venlafaxine -Discontinue Sinemet -Start Requip at 0.25 mg by mouth 3 times a day. -Continue with her home dose of bupropion -She can continue with her Topamax for migraine prophylaxis. -I would recommend minimizing her hydrocodone/oxycodone.  Please call with questions. -- Amie Portland, MD [facility] [phone]  If 7pm to 7am, please call on call as listed on AMION.

## 2017-04-25 NOTE — ED Notes (Signed)
Neuro at bedside.

## 2017-04-26 ENCOUNTER — Observation Stay (HOSPITAL_COMMUNITY): Payer: Self-pay

## 2017-04-26 ENCOUNTER — Encounter (HOSPITAL_COMMUNITY): Payer: Self-pay

## 2017-04-26 DIAGNOSIS — G259 Extrapyramidal and movement disorder, unspecified: Secondary | ICD-10-CM

## 2017-04-26 DIAGNOSIS — G253 Myoclonus: Secondary | ICD-10-CM

## 2017-04-26 DIAGNOSIS — E039 Hypothyroidism, unspecified: Secondary | ICD-10-CM

## 2017-04-26 DIAGNOSIS — R739 Hyperglycemia, unspecified: Secondary | ICD-10-CM

## 2017-04-26 LAB — GLUCOSE, CAPILLARY
GLUCOSE-CAPILLARY: 138 mg/dL — AB (ref 65–99)
GLUCOSE-CAPILLARY: 105 mg/dL — AB (ref 65–99)

## 2017-04-26 LAB — HIV ANTIBODY (ROUTINE TESTING W REFLEX): HIV SCREEN 4TH GENERATION: NONREACTIVE

## 2017-04-26 MED ORDER — BUPROPION HCL ER (XL) 300 MG PO TB24: 300.0000 mg | ORAL_TABLET | ORAL | 0 refills | Status: DC

## 2017-04-26 MED ORDER — LORAZEPAM 2 MG/ML IJ SOLN
1.0000 mg | Freq: Once | INTRAMUSCULAR | Status: AC
  Administered 2017-04-26: 1 mg via INTRAVENOUS
  Filled 2017-04-26: qty 1

## 2017-04-26 MED ORDER — ROPINIROLE HCL 0.25 MG PO TABS: 0.2500 mg | ORAL_TABLET | Freq: Three times a day (TID) | ORAL | 0 refills | Status: DC

## 2017-04-26 MED ORDER — GADOBENATE DIMEGLUMINE 529 MG/ML IV SOLN
16.0000 mL | Freq: Once | INTRAVENOUS | Status: AC | PRN
  Administered 2017-04-26: 16 mL via INTRAVENOUS

## 2017-04-26 MED FILL — BUPROPION HCL XL 300 MG TAB: 300 | 30 days supply | Qty: 15 | Fill #0

## 2017-04-26 MED FILL — rOPINIRole HCL 0.25 MG TABS: 0.25 | 30 days supply | Qty: 90 | Fill #0

## 2017-04-26 NOTE — Progress Notes (Signed)
EEG Completed; Results Pending  

## 2017-04-26 NOTE — Progress Notes (Signed)
Patient received from ED via wheelchair.patient is alert and oriented. Vital signs are stable.skin check done with another nurse found intact. Patient given instruction about call bell, phone and unit routine. Bed is in low position and side rail up x2.

## 2017-04-26 NOTE — Progress Notes (Signed)
Patient discharge teaching given, including activity, diet, follow-up appoints, and medications. Patient verbalized understanding of all discharge instructions. IV access was d/c'd. Vitals are stable. Skin is intact except as charted in most recent assessments. Pt to be escorted out by NT, to be driven home by family. 

## 2017-04-26 NOTE — Discharge Summary (Signed)
Physician Discharge Summary  Meghan Terry SKA:[id number] DOB: 11/17/1960 DOA: 04/25/2017  PCP: Garwin Brothers, MD  Admit date: 04/25/2017 Discharge date: 04/26/2017   Recommendations for Outpatient Follow-Up:   1. Outpatient follow up for titration of medications 2. May need psych referral for anxiety/depression   Discharge Diagnosis:   Principal Problem:   Myoclonic jerking Active Problems:   Migraine   Hypothyroid   Depression   Discharge disposition:  Home.  Discharge Condition: Improved.  Diet recommendation: Low sodium, heart healthy.  Carbohydrate-modified  Wound care: None.   History of Present Illness:   Meghan Terry is a 56 y.o. female with medical history of migraines, depression, hypothyroidism RLS on Sinemet presented to [facility] yesterday for spasms of upper body susepected to be due to Sinemet which was discontinued. She was asked to take Valium PRN but this did not help and she therefore returns. She also has a Migraine with light sensitivity.  She further tells me that she had vomiting on Thursday and Friday. Since then she has stopped eating solid food and has stopped vomiting. No abdominal pain and no diarrhea. No fever. Vomitus was mostly bilious and non bloody.    Hospital Course by Problem:  Per neuro: 56 year old woman with a past medical history of restless leg syndrome on Sinemet for many years, anxiety, depression and hypothyroidism and migraines, presents for evaluation of some abnormal movements that she notices off and on. She says that these movements of been bothersome and preventative from doing activities of daily living at times. The son reported that her movements look like she has had popping to the front with tongue sticking out, in a very non-localizable and nonorganic kind of pattern.  Per neuro, suspected to be conversion disorder. But given the fact that she is on multiple antidepressants along with Sinemet, she could have some form of  dyskinesias that might be affecting her.  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 -Continue with her home dose of bupropion -She can continue with her Topamax for migraine prophylaxis.  Asking for valium upon d/c.  Also requested a prescription for Wellbutrin as she is out.  Medical Consultants:   Neuro  Discharge Exam:   Vitals:   04/26/17 0024 04/26/17 0545  BP: (!) 155/79 (!) 152/78  Pulse: 98 85  Resp:  18  Temp:  97.9 F (36.6 C)   Vitals:   04/25/17 2009 04/25/17 2121 04/26/17 0024 04/26/17 0545  BP: (!) 160/88 (!) 169/76 (!) 155/79 (!) 152/78  Pulse: 87 83 98 85  Resp: 18 18  18   Temp: 98 F (36.7 C) 97.7 F (36.5 C)  97.9 F (36.6 C)  TempSrc: Oral Oral  Oral  SpO2: 100% 100%  99%  Weight: 72.6 kg (160 lb)     Height: 5\' 3"  (1.6 m)       Gen:  NAD- no abnormal movements   The results of significant diagnostics from this hospitalization (including imaging, microbiology, ancillary and laboratory) are listed below for reference.     Procedures and Diagnostic Studies:   Mr Jeri Cos OM Contrast  Result Date: 04/26/2017 CLINICAL DATA:  Movement disorder.  Vomiting EXAM: MRI HEAD WITHOUT AND WITH CONTRAST TECHNIQUE: Multiplanar, multiecho pulse  sequences of the brain and surrounding structures were obtained without and with intravenous contrast. CONTRAST:  100mL MULTIHANCE GADOBENATE DIMEGLUMINE 529 MG/ML IV SOLN COMPARISON:  None. FINDINGS: Brain: Image quality degraded by motion. Ventricle size normal.  Cerebral volume normal for age. Negative for acute infarction. Scattered small white matter hyperintensities  most consistent with chronic microvascular ischemia. Hyperintensity central pons also likely due to chronic ischemia. Small area of hyperintensity left thalamus. Negative for hemorrhage or mass. Empty sella noted. Normal enhancement following contrast infusion. Vascular: Normal arterial flow voids Skull and upper cervical spine: Negative Sinuses/Orbits: Minimal mucosal edema paranasal sinuses. Normal orbit. Other: none IMPRESSION: Mild chronic microvascular ischemia.  No acute abnormality. Electronically Signed   By: Franchot Gallo M.D.   On: 04/26/2017 15:14     Labs:   Basic Metabolic Panel:  Recent Labs Lab 04/23/17 1010 04/25/17 1430  NA 140 140  K 3.8 3.6  CL 107 108  CO2  --  23  GLUCOSE 216* 200*  BUN 12 8  CREATININE 0.90 0.93  CALCIUM  --  9.3   GFR Estimated Creatinine Clearance: 63 mL/min (by C-G formula based on SCr of 0.93 mg/dL). Liver Function Tests:  Recent Labs Lab 04/25/17 1430  AST 45*  ALT 47  ALKPHOS 85  BILITOT 0.7  PROT 7.2  ALBUMIN 3.8    Recent Labs Lab 04/25/17 1430  LIPASE 44   No results for input(s): AMMONIA in the last 168 hours. Coagulation profile No results for input(s): INR, PROTIME in the last 168 hours.  CBC:  Recent Labs Lab 04/23/17 1010 04/25/17 1430  WBC  --  13.8*  HGB 13.6 13.7  HCT 40.0 40.4  MCV  --  87.8  PLT  --  360   Cardiac Enzymes: No results for input(s): CKTOTAL, CKMB, CKMBINDEX, TROPONINI in the last 168 hours. BNP: Invalid input(s): POCBNP CBG:  Recent Labs Lab 04/25/17 2123 04/26/17 0805 04/26/17 1205  GLUCAP 159* 138* 105*   D-Dimer No results for input(s): DDIMER in the last 72 hours. Hgb A1c No results for input(s): HGBA1C in the last 72 hours. Lipid Profile No results for input(s): CHOL, HDL, LDLCALC, TRIG, CHOLHDL, LDLDIRECT in the last 72 hours. Thyroid function studies No results for input(s): TSH, T4TOTAL, T3FREE, THYROIDAB in the last 72 hours.  Invalid input(s):  FREET3 Anemia work up No results for input(s): VITAMINB12, FOLATE, FERRITIN, TIBC, IRON, RETICCTPCT in the last 72 hours. Microbiology No results found for this or any previous visit (from the past 240 hour(s)).   Discharge Instructions:   Discharge Instructions    Diet - low sodium heart healthy    Complete by:  As directed    Diet Carb Modified    Complete by:  As directed    Increase activity slowly    Complete by:  As directed      Allergies as of 04/26/2017   No Known Allergies     Medication List    STOP taking these medications   ARIPiprazole 5 MG tablet Commonly known as:  ABILIFY   carbidopa-levodopa 25-250 MG tablet Commonly known as:  SINEMET IR   carbidopa-levodopa 50-200 MG tablet Commonly known as:  SINEMET CR   venlafaxine XR 75 MG 24 hr capsule Commonly known as:  EFFEXOR-XR     TAKE these medications   buPROPion 300 MG 24 hr tablet Commonly known as:  WELLBUTRIN XL Take 300 mg by mouth every morning.   levothyroxine 75 MCG tablet Commonly known as:  SYNTHROID, LEVOTHROID Take 75 mcg by mouth daily before breakfast.   losartan 50 MG tablet Commonly known as:  COZAAR Take 50 mg by mouth every morning.   metFORMIN 1000 MG tablet Commonly known as:  GLUCOPHAGE Take 1,000 mg by mouth 2 (two) times daily.   metoprolol tartrate 25 MG tablet Commonly known as:  LOPRESSOR Take 25 mg by mouth daily as needed (for Trigeminy).   rOPINIRole 0.25 MG tablet Commonly known as:  REQUIP Take 1 tablet (0.25 mg total) by mouth 3 (three) times daily.   topiramate 100 MG tablet Commonly known as:  TOPAMAX Take 100 mg by mouth 2 (two) times daily.      Follow-up Information    Garwin Brothers, MD Follow up in 1 week(s).   Specialty:  Internal Medicine Contact information: [place] [address] [phone]            Time coordinating discharge: 35 min  Signed:  Kayne Yuhas Alison Stalling   [facility] 04/26/2017, 4:15  PM

## 2017-04-26 NOTE — Procedures (Signed)
ELECTROENCEPHALOGRAM REPORT  Date of Study: 04/26/2017  Patient's Name: Meghan Terry MRN: [id number] Date of Birth: 1961/01/31  Referring Provider: Amie Portland, MD  Clinical History: 56 y.o. female with medical history of migraines, depression, hypothyroidism RLS on Sinemet presents with uncontrollable upper body jerking movements.  Medications: Hospital Medications acetaminophen (TYLENOL) tablet 650 mg  buPROPion (WELLBUTRIN XL) 24 hr tablet 300 mgdiazepam (VALIUM) tablet 2 mg  enoxaparin (LOVENOX) injection 40 mg  ibuprofen (ADVIL,MOTRIN) tablet 400 mg  insulin aspart (novoLOG) injection  levothyroxine (SYNTHROID, LEVOTHROID) tablet 75 mcg  ondansetron (ZOFRAN) injection 4 mg  rOPINIRole (REQUIP) tablet 0.25 mg  topiramate (TOPAMAX) tablet 100 mg  Outpatient Medications ARIPiprazole (ABILIFY) 5 MG tablet  buPROPion (WELLBUTRIN XL) 300 MG 24 hr tabletcarbidopa-levodopa (SINEMET CR) 50-200 MG tablet  carbidopa-levodopa (SINEMET IR) 25-250 MG tabletlevothyroxine (SYNTHROID, LEVOTHROID) 75 MCG tablet  losartan (COZAAR) 50 MG tablet  metFORMIN (GLUCOPHAGE) 1000 MG tablet  metoprolol tartrate (LOPRESSOR) 25 MG tablet  topiramate (TOPAMAX) 100 MG tablet  venlafaxine XR (EFFEXOR-XR) 75 MG 24 hr capsule  Technical Summary: A multichannel digital EEG recording measured by the international 10-20 system with electrodes applied with paste and impedances below 5000 ohms performed in our laboratory with EKG monitoring in an awake and drowsy patient.  Hyperventilation was not performed.  Photic stimulation was performed.  The digital EEG was referentially recorded, reformatted, and digitally filtered in a variety of bipolar and referential montages for optimal display.    Description: The patient is awake and drowsy during the recording.  During maximal wakefulness, there is a symmetric, medium voltage 9 Hz posterior dominant rhythm that attenuates with eye opening.  The record is symmetric.   During drowsiness, there is an increase in theta slowing of the background.  Stage 2 sleep is not seen.  Photic stimulation did not elicit any abnormalities.  There were no epileptiform discharges or electrographic seizures seen.    EKG lead was unremarkable.  Impression: This awake and drowsy EEG is normal.    Clinical Correlation: A normal EEG does not exclude a clinical diagnosis of epilepsy.  If further clinical questions remain, prolonged EEG may be helpful.  Clinical correlation is advised.  Metta Clines, DO

## 2017-04-27 ENCOUNTER — Telehealth: Payer: Self-pay | Admitting: Emergency Medicine

## 2017-04-27 NOTE — Telephone Encounter (Signed)
CM contacted pt, [phone], after being unable to get in touch with planned follow up appointment clinic to establish PCP.  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  Pt states the providers there have agreed to see her and she will not need help with PCP establishment.  No further CM needs noted at this time.

## 2017-05-04 MED FILL — LEVOTHYROXINE 75 MCG TABLET: 75 | 30 days supply | Qty: 30 | Fill #0

## 2017-05-04 MED FILL — metFORMIN HCL 1000 MG TABS: 1000 | 30 days supply | Qty: 60 | Fill #0

## 2017-05-04 MED FILL — TOPIRAMATE 100 MG TABLET: 100 | 30 days supply | Qty: 60 | Fill #0

## 2017-05-04 MED FILL — LORazepam 0.5 MG TABS: 0.5 | 30 days supply | Qty: 30 | Fill #0

## 2017-05-07 MED FILL — buPROPion HCL ER (XL) 300 M: 300 | 30 days supply | Qty: 30 | Fill #0

## 2017-05-14 MED FILL — rOPINIRole HCL 1 MG TABS: 1 | 13 days supply | Qty: 30 | Fill #0

## 2017-05-24 MED FILL — GABAPENTIN 300 MG CAPSULE: 300 | 30 days supply | Qty: 120 | Fill #0 | Status: TO

## 2017-05-24 MED FILL — rOPINIRole HCL 4 MG TABS: 4 | 30 days supply | Qty: 30 | Fill #0

## 2017-05-28 MED FILL — LOSARTAN POTASSIUM 50 MG TA: 50 | 30 days supply | Qty: 30 | Fill #2

## 2017-06-04 MED FILL — BUPROPION HCL XL 300 MG TAB: 300 | 30 days supply | Qty: 30 | Fill #1

## 2017-06-04 MED FILL — LORazepam 0.5 MG TABS: 0.5 | 30 days supply | Qty: 30 | Fill #1

## 2017-06-04 MED FILL — metFORMIN HCL 1000 MG TABS: 1000 | 30 days supply | Qty: 60 | Fill #1

## 2017-06-04 MED FILL — TOPIRAMATE 100 MG TABLET: 100 | 30 days supply | Qty: 60 | Fill #1

## 2017-06-04 MED FILL — rOPINIRole HCL 1 MG TABS: 1 | 13 days supply | Qty: 30 | Fill #1 | Status: TO

## 2017-06-21 MED FILL — rOPINIRole HCL 4 MG TABS: 4 | 30 days supply | Qty: 30 | Fill #1

## 2017-06-24 MED FILL — LOSARTAN POTASSIUM 50 MG TA: 50 | 30 days supply | Qty: 30 | Fill #0

## 2017-06-29 MED FILL — LEVOTHYROXINE 75 MCG TABLET: 75 | 30 days supply | Qty: 30 | Fill #0

## 2017-07-12 MED FILL — BUPROPION HCL XL 300 MG TAB: 300 | 30 days supply | Qty: 30 | Fill #2 | Status: TO

## 2017-07-12 MED FILL — metFORMIN HCL 1000 MG TABS: 1000 | 30 days supply | Qty: 60 | Fill #2

## 2017-07-12 MED FILL — TOPIRAMATE 100 MG TABLET: 100 | 30 days supply | Qty: 60 | Fill #2

## 2017-07-22 MED FILL — LOSARTAN POTASSIUM 50 MG TA: 50 | 30 days supply | Qty: 30 | Fill #1 | Status: TO

## 2017-07-22 MED FILL — rOPINIRole HCL 4 MG TABS: 4 | 30 days supply | Qty: 30 | Fill #2 | Status: TO

## 2017-08-02 MED FILL — LEVOTHYROXINE 75 MCG TABLET: 75 | 30 days supply | Qty: 30 | Fill #1 | Status: TO

## 2017-08-02 MED FILL — LORazepam 0.5 MG TABS: 0.5 | 30 days supply | Qty: 30 | Fill #0 | Status: TO

## 2017-08-10 MED FILL — BUPROPION HCL XL 300 MG TAB: 300 | 30 days supply | Qty: 30 | Fill #0

## 2017-08-12 MED FILL — GABAPENTIN 300 MG CAPSULE: 300 | 30 days supply | Qty: 120 | Fill #0 | Status: TO

## 2017-08-12 MED FILL — TOPIRAMATE 100 MG TABLET: 100 | 30 days supply | Qty: 60 | Fill #0

## 2017-08-12 MED FILL — metFORMIN HCL 1000 MG TABS: 1000 | 30 days supply | Qty: 60 | Fill #0

## 2017-08-24 MED FILL — rOPINIRole HCL 4 MG TABS: 4 | 30 days supply | Qty: 30 | Fill #0 | Status: TO

## 2017-08-24 MED FILL — LOSARTAN POTASSIUM 50 MG TA: 50 | 30 days supply | Qty: 30 | Fill #0

## 2017-09-03 MED FILL — LEVOTHYROXINE 75 MCG TABLET: 75 | 30 days supply | Qty: 30 | Fill #0

## 2017-09-09 MED FILL — metFORMIN HCL 1000 MG TABS: 1000 | 30 days supply | Qty: 60 | Fill #1

## 2017-09-13 MED FILL — GABAPENTIN 300 MG CAPSULE: 300 | 30 days supply | Qty: 120 | Fill #1 | Status: TO

## 2017-09-13 MED FILL — BUPROPION HCL XL 300 MG TAB: 300 | 30 days supply | Qty: 30 | Fill #1

## 2017-09-13 MED FILL — TOPIRAMATE 100 MG TABLET: 100 | 30 days supply | Qty: 60 | Fill #1

## 2017-09-13 MED FILL — LORazepam 0.5 MG TABS: 0.5 | 30 days supply | Qty: 30 | Fill #0

## 2017-09-24 MED FILL — rOPINIRole HCL 4 MG TABS: 4 | 30 days supply | Qty: 30 | Fill #1 | Status: TO

## 2017-09-24 MED FILL — rOPINIRole HCL 1 MG TABS: 1 | 13 days supply | Qty: 30 | Fill #0 | Status: TO

## 2017-09-29 MED FILL — LEVOTHYROXINE 75 MCG TABLET: 75 | 30 days supply | Qty: 30 | Fill #1

## 2017-10-15 MED FILL — LOSARTAN POTASSIUM 50 MG TA: 50 | 30 days supply | Qty: 30 | Fill #0

## 2017-10-15 MED FILL — CIPROFLOXACIN HCL 500 MG TA: 500 | 7 days supply | Qty: 14 | Fill #0

## 2017-10-15 MED FILL — GLIMEPIRIDE 1 MG TABLET: 1 | 30 days supply | Qty: 30 | Fill #0

## 2017-10-15 MED FILL — LEVOTHYROXINE 75 MCG TABLET: 75 | 30 days supply | Qty: 30 | Fill #0

## 2017-10-15 MED FILL — METOPROLOL TARTRATE 25 MG T: 25 | 30 days supply | Qty: 30 | Fill #0

## 2017-10-15 MED FILL — metFORMIN HCL 1000 MG TABS: 1000 | 30 days supply | Qty: 60 | Fill #0

## 2017-10-15 MED FILL — TOPIRAMATE 100 MG TABLET: 100 | 30 days supply | Qty: 60 | Fill #0

## 2017-10-15 MED FILL — BUPROPION HCL XL 300 MG TAB: 300 | 30 days supply | Qty: 30 | Fill #0

## 2017-10-15 MED FILL — LORazepam 0.5 MG TABS: 0.5 | 30 days supply | Qty: 30 | Fill #0

## 2017-10-25 MED FILL — rOPINIRole HCL 1 MG TABS: 1 | 13 days supply | Qty: 30 | Fill #1 | Status: TO

## 2017-10-25 MED FILL — rOPINIRole HCL 4 MG TABS: 4 | 30 days supply | Qty: 30 | Fill #2 | Status: TO

## 2017-11-16 MED FILL — LORazepam 0.5 MG TABS: 0.5 | 30 days supply | Qty: 30 | Fill #1

## 2017-11-16 MED FILL — GABAPENTIN 300 MG CAPSULE: 300 | 30 days supply | Qty: 120 | Fill #2 | Status: TO

## 2017-11-16 MED FILL — LOSARTAN POTASSIUM 50 MG TA: 50 | 30 days supply | Qty: 30 | Fill #1

## 2017-11-16 MED FILL — LEVOTHYROXINE 75 MCG TABLET: 75 | 30 days supply | Qty: 30 | Fill #1

## 2017-11-16 MED FILL — GLIMEPIRIDE 1 MG TABLET: 1 | 30 days supply | Qty: 30 | Fill #1

## 2017-11-16 MED FILL — rOPINIRole HCL 1 MG TABS: 1 | 13 days supply | Qty: 30 | Fill #2 | Status: TO

## 2017-11-16 MED FILL — BUPROPION HCL XL 300 MG TAB: 300 | 30 days supply | Qty: 30 | Fill #1

## 2017-11-16 MED FILL — metFORMIN HCL 1000 MG TABS: 1000 | 30 days supply | Qty: 60 | Fill #1

## 2017-11-16 MED FILL — TOPIRAMATE 100 MG TABLET: 100 | 30 days supply | Qty: 60 | Fill #1

## 2017-11-16 MED FILL — METOPROLOL TARTRATE 25 MG T: 25 | 30 days supply | Qty: 30 | Fill #1

## 2017-11-25 MED FILL — rOPINIRole HCL 4 MG TABS: 4 | 30 days supply | Qty: 30 | Fill #3 | Status: TO

## 2017-12-13 MED FILL — LOSARTAN POTASSIUM 50 MG TA: 50 | 30 days supply | Qty: 30 | Fill #2

## 2017-12-13 MED FILL — TOPIRAMATE 100 MG TABLET: 100 | 30 days supply | Qty: 60 | Fill #2

## 2017-12-13 MED FILL — BUPROPION HCL XL 300 MG TAB: 300 | 30 days supply | Qty: 30 | Fill #2

## 2017-12-13 MED FILL — METOPROLOL TARTRATE 25 MG T: 25 | 30 days supply | Qty: 30 | Fill #2

## 2017-12-13 MED FILL — LEVOTHYROXINE 75 MCG TABLET: 75 | 30 days supply | Qty: 30 | Fill #2

## 2017-12-13 MED FILL — LORazepam 0.5 MG TABS: 0.5 | 30 days supply | Qty: 30 | Fill #2

## 2017-12-13 MED FILL — GABAPENTIN 300 MG CAPSULE: 300 | 30 days supply | Qty: 120 | Fill #3 | Status: TO

## 2017-12-13 MED FILL — rOPINIRole HCL 1 MG TABS: 1 | 13 days supply | Qty: 30 | Fill #3 | Status: TO

## 2017-12-13 MED FILL — GLIMEPIRIDE 1 MG TABLET: 1 | 30 days supply | Qty: 30 | Fill #2 | Status: TO

## 2017-12-13 MED FILL — metFORMIN HCL 1000 MG TABS: 1000 | 30 days supply | Qty: 60 | Fill #2

## 2017-12-22 MED FILL — rOPINIRole HCL 1 MG TABS: 1 | 13 days supply | Qty: 30 | Fill #4 | Status: TO

## 2017-12-31 MED FILL — rOPINIRole HCL 4 MG TABS: 4 | 30 days supply | Qty: 30 | Fill #4 | Status: TO

## 2018-01-13 MED FILL — metFORMIN HCL 1000 MG TABS: 1000 | 30 days supply | Qty: 60 | Fill #3

## 2018-01-13 MED FILL — BUPROPION HCL XL 300 MG TAB: 300 | 30 days supply | Qty: 30 | Fill #2

## 2018-01-13 MED FILL — LEVOTHYROXINE 75 MCG TABLET: 75 | 30 days supply | Qty: 30 | Fill #3

## 2018-01-13 MED FILL — TOPIRAMATE 100 MG TABLET: 100 | 30 days supply | Qty: 60 | Fill #3

## 2018-01-13 MED FILL — LOSARTAN POTASSIUM 50 MG TA: 50 | 30 days supply | Qty: 30 | Fill #3

## 2018-01-13 MED FILL — rOPINIRole HCL 1 MG TABS: 1 | 13 days supply | Qty: 30 | Fill #5 | Status: TO

## 2018-02-02 MED FILL — METOPROLOL TARTRATE 25 MG T: 25 | 30 days supply | Qty: 30 | Fill #0 | Status: TO

## 2018-02-02 MED FILL — rOPINIRole HCL 4 MG TABS: 4 | 30 days supply | Qty: 30 | Fill #5 | Status: TO

## 2018-02-15 MED FILL — LEVOTHYROXINE 75 MCG TABLET: 75 | 30 days supply | Qty: 30 | Fill #0 | Status: TO

## 2018-02-15 MED FILL — LOSARTAN POTASSIUM 50 MG TA: 50 | 30 days supply | Qty: 30 | Fill #0 | Status: TO

## 2018-02-15 MED FILL — buPROPion HCL ER (XL) 300 M: 300 | 30 days supply | Qty: 30 | Fill #0 | Status: TO

## 2018-02-15 MED FILL — metFORMIN HCL 1000 MG TABS: 1000 | 30 days supply | Qty: 60 | Fill #0 | Status: TO

## 2018-02-15 MED FILL — rOPINIRole HCL 1 MG TABS: 1 | 13 days supply | Qty: 30 | Fill #6 | Status: TO

## 2018-02-15 MED FILL — TOPIRAMATE 100 MG TABLET: 100 | 30 days supply | Qty: 60 | Fill #0 | Status: TO

## 2019-06-20 MED FILL — metFORMIN HCL 500 MG TABS: 500 | 30 days supply | Qty: 60 | Fill #0

## 2019-06-20 MED FILL — traZODone HCL 150 MG TABS: 150 | 30 days supply | Qty: 30 | Fill #0

## 2019-06-21 MED FILL — LEVOTHYROXINE SODIUM 75 MCG: 75 | 30 days supply | Qty: 30 | Fill #0

## 2019-06-21 MED FILL — TOPIRAMATE 100 MG TABLET: 100 | 30 days supply | Qty: 60 | Fill #0

## 2019-06-21 MED FILL — buPROPion HCL ER (XL) 300 M: 300 | 30 days supply | Qty: 30 | Fill #0

## 2019-06-21 MED FILL — METOPROLOL TARTRATE 25 MG T: 25 | 30 days supply | Qty: 30 | Fill #0

## 2019-06-21 MED FILL — LOSARTAN POTASSIUM 50 MG TA: 50 | 30 days supply | Qty: 30 | Fill #0

## 2019-06-21 MED FILL — rOPINIRole HCL 1 MG TABS: 1 | 90 days supply | Qty: 270 | Fill #0

## 2019-07-06 MED FILL — rOPINIRole HCL 4 MG TABS: 4 | 30 days supply | Qty: 30 | Fill #0

## 2019-07-20 MED FILL — traZODone HCL 150 MG TABS: 150 | 30 days supply | Qty: 30 | Fill #1

## 2019-08-03 MED FILL — TOPIRAMATE 100 MG TABLET: 100 | 30 days supply | Qty: 60 | Fill #1

## 2019-08-03 MED FILL — metFORMIN HCL 500 MG TABS: 500 | 30 days supply | Qty: 60 | Fill #1

## 2019-08-03 MED FILL — METOPROLOL TARTRATE 25 MG T: 25 | 30 days supply | Qty: 30 | Fill #1

## 2019-08-03 MED FILL — LOSARTAN POTASSIUM 50 MG TA: 50 | 30 days supply | Qty: 30 | Fill #1

## 2019-08-03 MED FILL — buPROPion HCL ER (XL) 300 M: 300 | 30 days supply | Qty: 30 | Fill #1

## 2019-08-03 MED FILL — LEVOTHYROXINE SODIUM 75 MCG: 75 | 30 days supply | Qty: 30 | Fill #1

## 2019-08-16 MED FILL — traZODone HCL 150 MG TABS: 150 | 30 days supply | Qty: 30 | Fill #2

## 2019-09-05 ENCOUNTER — Encounter: Payer: Self-pay | Admitting: Cardiology

## 2019-09-05 ENCOUNTER — Other Ambulatory Visit: Payer: Self-pay

## 2019-09-05 ENCOUNTER — Ambulatory Visit (INDEPENDENT_AMBULATORY_CARE_PROVIDER_SITE_OTHER): Payer: Commercial Managed Care - PPO | Admitting: Cardiology

## 2019-09-05 VITALS — BP 168/88 | HR 85 | Ht 63.0 in | Wt 129.0 lb

## 2019-09-05 DIAGNOSIS — E088 Diabetes mellitus due to underlying condition with unspecified complications: Secondary | ICD-10-CM

## 2019-09-05 DIAGNOSIS — I209 Angina pectoris, unspecified: Secondary | ICD-10-CM

## 2019-09-05 DIAGNOSIS — Z8249 Family history of ischemic heart disease and other diseases of the circulatory system: Secondary | ICD-10-CM

## 2019-09-05 DIAGNOSIS — I10 Essential (primary) hypertension: Secondary | ICD-10-CM

## 2019-09-05 DIAGNOSIS — Z87891 Personal history of nicotine dependence: Secondary | ICD-10-CM

## 2019-09-05 HISTORY — DX: Personal history of nicotine dependence: Z87.891

## 2019-09-05 HISTORY — DX: Family history of ischemic heart disease and other diseases of the circulatory system: Z82.49

## 2019-09-05 HISTORY — DX: Diabetes mellitus due to underlying condition with unspecified complications: E08.8

## 2019-09-05 HISTORY — DX: Essential (primary) hypertension: I10

## 2019-09-05 MED ORDER — ASPIRIN EC 81 MG PO TBEC: 81.0000 mg | DELAYED_RELEASE_TABLET | Freq: Every day | ORAL | 3 refills | Status: AC

## 2019-09-05 NOTE — Addendum Note (Signed)
Addended by: Beckey Rutter on: 09/05/2019 02:19 PM   Modules accepted: Orders

## 2019-09-05 NOTE — Patient Instructions (Addendum)
Medication Instructions:  Your physician has recommended you make the following change in your medication:  START taking nitroglycerin as needed for chest pain, When having chest pain, stop what you are doing and sit down. Take 1 nitro, wait 5 minutes. Still having chest pain, take 1 nitro, wait 5 minutes. Still having chest pain, take 1 nitro, dial 911. Total of 3 nitro in 15 minutes.   START aspirin 81 mg(1 tablet) once daily  *If you need a refill on your cardiac medications before your next appointment, please call your pharmacy*  Lab Work: NONE  If you have labs (blood work) drawn today and your tests are completely normal, you will receive your results only by: Marland Kitchen MyChart Message (if you have MyChart) OR . A paper copy in the mail If you have any lab test that is abnormal or we need to change your treatment, we will call you to review the results.  Testing/Procedures: You had an EKG performed today  A chest x-ray takes a picture of the organs and structures inside the chest, including the heart, lungs, and blood vessels. This test can show several things, including, whether the heart is enlarges; whether fluid is building up in the lungs; and whether pacemaker / defibrillator leads are still in place.  Your physician has requested that you have a cardiac catheterization. Cardiac catheterization is used to diagnose and/or treat various heart conditions. Doctors may recommend this procedure for a number of different reasons. The most common reason is to evaluate chest pain. Chest pain can be a symptom of coronary artery disease (CAD), and cardiac catheterization can show whether plaque is narrowing or blocking your heart's arteries. This procedure is also used to evaluate the valves, as well as measure the blood flow and oxygen levels in different parts of your heart. For further information please visit [email or web address]. Please follow instruction sheet, as given.   YOU are scheduled  for covid 19 screening on Dec.14th at [place] AM at [place], [place]. You will need to get in the prescreening line.     [address] AT [address] [place] [address] Dept: [phone] Loc: [phone]  HONOKA BYARD  09/05/2019  You are scheduled for a Cardiac Catheterization on Thursday, December 17 with Dr. Larae Grooms.  1. Please arrive at the [facility] (Main Entrance A) at [facility]: [address] [place], [place] [address] at 5:30 AM (This time is two hours before your procedure to ensure your preparation). Free valet parking service is available.   Special note: Every effort is made to have your procedure done on time. Please understand that emergencies sometimes delay scheduled procedures.  2. Diet: Do not eat solid foods after midnight.  The patient may have clear liquids until 5am upon the day of the procedure.  3. Labs: NONE 4. Medication instructions in preparation for your procedure:   Contrast Allergy: No   Stop taking, Cozaar (Losartan) Wednesday, December 16,  Do not take Diabetes Med Glucophage (Metformin) on the day of the procedure and HOLD 48 HOURS AFTER THE PROCEDURE.  On the morning of your procedure, take your Aspirin and any morning medicines NOT listed above.  You may use sips of water.  5. Plan for one night stay--bring personal belongings. 6. Bring a current list of your medications and current insurance cards. 7. You MUST have a responsible person to drive you home. 8. Someone MUST  be with you the first 24 hours after you arrive home or your discharge will be delayed. 9. Please wear clothes that are easy to get on and off and wear slip-on shoes.  Thank you for allowing [place] to care for you!   -- [facility]  Follow-Up: At [facility], you and your health needs are our priority.  As part of our  continuing mission to provide you with exceptional heart care, we have created designated Provider Care Teams.  These Care Teams include your primary Cardiologist (physician) and Advanced Practice Providers (APPs -  Physician Assistants and Nurse Practitioners) who all work together to provide you with the care you need, when you need it.  Your next appointment:   1 month(s)  The format for your next appointment:   In Person  Provider:   Jyl Heinz, MD  Other Instructions Nitroglycerin sublingual tablets What is this medicine? NITROGLYCERIN (nye troe GLI ser in) is a type of vasodilator. It relaxes blood vessels, increasing the blood and oxygen supply to your heart. This medicine is used to relieve chest pain caused by angina. It is also used to prevent chest pain before activities like climbing stairs, going outdoors in cold weather, or sexual activity. This medicine may be used for other purposes; ask your health care provider or pharmacist if you have questions. COMMON BRAND NAME(S): Nitroquick, Nitrostat, Nitrotab What should I tell my health care provider before I take this medicine? They need to know if you have any of these conditions:  anemia  head injury, recent stroke, or bleeding in the brain  liver disease  previous heart attack  an unusual or allergic reaction to nitroglycerin, other medicines, foods, dyes, or preservatives  pregnant or trying to get pregnant  breast-feeding How should I use this medicine? Take this medicine by mouth as needed. At the first sign of an angina attack (chest pain or tightness) place one tablet under your tongue. You can also take this medicine 5 to 10 minutes before an event likely to produce chest pain. Follow the directions on the prescription label. Let the tablet dissolve under the tongue. Do not swallow whole. Replace the dose if you accidentally swallow it. It will help if your mouth is not dry. Saliva around the tablet will help  it to dissolve more quickly. Do not eat or drink, smoke or chew tobacco while a tablet is dissolving. If you are not better within 5 minutes after taking ONE dose of nitroglycerin, call 9-1-1 immediately to seek emergency medical care. Do not take more than 3 nitroglycerin tablets over 15 minutes. If you take this medicine often to relieve symptoms of angina, your doctor or health care professional may provide you with different instructions to manage your symptoms. If symptoms do not go away after following these instructions, it is important to call 9-1-1 immediately. Do not take more than 3 nitroglycerin tablets over 15 minutes. Talk to your pediatrician regarding the use of this medicine in children. Special care may be needed. Overdosage: If you think you have taken too much of this medicine contact a poison control center or emergency room at once. NOTE: This medicine is only for you. Do not share this medicine with others. What if I miss a dose? This does not apply. This medicine is only used as needed. What may interact with this medicine? Do not take this medicine with any of the following medications:  certain migraine medicines like ergotamine and dihydroergotamine (DHE)  medicines used to treat erectile dysfunction like sildenafil, tadalafil, and vardenafil  riociguat This medicine may also interact with the following medications:  alteplase  aspirin  heparin  medicines for high blood pressure  medicines for mental depression  other medicines used to treat angina  phenothiazines like chlorpromazine, mesoridazine, prochlorperazine, thioridazine This list may not describe all possible interactions. Give your health care provider a list of all the medicines, herbs, non-prescription drugs, or dietary supplements you use. Also tell them if you smoke, drink alcohol, or use illegal drugs. Some items may interact with your medicine. What should I watch for while using this  medicine? Tell your doctor or health care professional if you feel your medicine is no longer working. Keep this medicine with you at all times. Sit or lie down when you take your medicine to prevent falling if you feel dizzy or faint after using it. Try to remain calm. This will help you to feel better faster. If you feel dizzy, take several deep breaths and lie down with your feet propped up, or bend forward with your head resting between your knees. You may get drowsy or dizzy. Do not drive, use machinery, or do anything that needs mental alertness until you know how this drug affects you. Do not stand or sit up quickly, especially if you are an older patient. This reduces the risk of dizzy or fainting spells. Alcohol can make you more drowsy and dizzy. Avoid alcoholic drinks. Do not treat yourself for coughs, colds, or pain while you are taking this medicine without asking your doctor or health care professional for advice. Some ingredients may increase your blood pressure. What side effects may I notice from receiving this medicine? Side effects that you should report to your doctor or health care professional as soon as possible:  blurred vision  dry mouth  skin rash  sweating  the feeling of extreme pressure in the head  unusually weak or tired Side effects that usually do not require medical attention (report to your doctor or health care professional if they continue or are bothersome):  flushing of the face or neck  headache  irregular heartbeat, palpitations  nausea, vomiting This list may not describe all possible side effects. Call your doctor for medical advice about side effects. You may report side effects to FDA at 1-800-FDA-1088. Where should I keep my medicine? Keep out of the reach of children. Store at room temperature between 20 and 25 degrees C (68 and 77 degrees F). Store in [facility]. Protect from light and moisture. Keep tightly closed. Throw away any  unused medicine after the expiration date. NOTE: This sheet is a summary. It may not cover all possible information. If you have questions about this medicine, talk to your doctor, pharmacist, or health care provider.  2020 Elsevier/Gold Standard (2013-07-13 17:57:36)

## 2019-09-05 NOTE — Progress Notes (Signed)
Cardiology Office Note:    Date:  09/05/2019   ID:  Meghan Terry, DOB [birth year], MRN [id number]  PCP:  Garwin Brothers, MD  Cardiologist:  Jenean Lindau, MD   Referring MD: Garwin Brothers, MD    ASSESSMENT:    1. Essential hypertension   2. Angina pectoris (Meghan Terry)   3. Diabetes mellitus due to underlying condition with unspecified complications (Meghan Terry)   4. Ex-smoker   5. Family history of coronary artery disease    PLAN:    In order of problems listed above:  1. Angina pectoris: The patient symptoms are very concerning.In view of the patient's symptoms, I discussed with the patient options for evaluation. Invasive and noninvasive options were given to the patient. I discussed stress testing and coronary angiography and left heart catheterization at length. Benefits, pros and cons of each approach were discussed at length. Patient had multiple questions which were answered to the patient's satisfaction. Patient opted for invasive evaluation and we will set up for coronary angiography and left heart catheterization. Further recommendations will be made based on the findings with coronary angiography. In the interim if the patient has any significant symptoms in hospital to the nearest emergency room. 2. Sublingual nitroglycerin prescription was sent, its protocol and 911 protocol explained and the patient vocalized understanding questions were answered to the patient's satisfaction 3. She was also advised to take a coated baby aspirin on a regular basis. 4. Essential hypertension: Blood pressure is stable.  She has an element of whitecoat hypertension 5. Diabetes mellitus: Managed by her primary care physician. 6. I am not aware of her lipid profile this is managed by her primary care physician. 7. Ex-smoker: She promises to never go back to smoking.  Medication Adjustments/Labs and Tests Ordered: Current medicines are reviewed at length with the patient today.  Concerns regarding medicines are  outlined above.  Orders Placed This Encounter  Procedures  . EKG 12-Lead   Meds ordered this encounter  Medications  . aspirin EC 81 MG tablet    Sig: Take 1 tablet (81 mg total) by mouth daily.    Dispense:  90 tablet    Refill:  3     History of Present Illness:    Meghan Terry is a 58 y.o. female who is being seen today for the evaluation of chest pain at the request of Garwin Brothers, MD.  Patient is a pleasant 58 year old female.  She has past medical history of essential hypertension, diabetes mellitus dyslipidemia and history of significant smoking.  She quit smoking but she vapes.  She has strong family history of coronary artery disease.  Patient has been referred here for chest pain.  She tells me that when she exerts herself she has crushing chest pain radiating to the neck or into the arm.  She stops and feels better.  For this reason she was referred.  At the time of my evaluation, the patient is alert awake oriented and in no distress.  She does not exercise on a regular basis but very active at work.  Past Medical History:  Diagnosis Date  . Anxiety   . Hypothyroid   . RLS (restless legs syndrome)   . Thyroid disease     Past Surgical History:  Procedure Laterality Date  . ABDOMINAL HYSTERECTOMY    . APPENDECTOMY    . CHOLECYSTECTOMY    . COLONOSCOPY    . KNEE ARTHROSCOPY    . 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 TUBAL LIGATION      Current Medications: Current Meds  Medication Sig  . buPROPion (WELLBUTRIN XL) 300 MG 24 hr tablet Take 1 tablet (300 mg total) by mouth every morning.  Marland Kitchen levothyroxine (SYNTHROID, LEVOTHROID) 75 MCG tablet Take 75 mcg by mouth daily before breakfast.  . losartan (COZAAR) 50 MG tablet Take 50 mg  by mouth every morning.  . metFORMIN (GLUCOPHAGE) 500 MG tablet SMARTSIG:1 Tablet(s) By Mouth Morning-Night  . metoprolol tartrate (LOPRESSOR) 25 MG tablet Take 25 mg by mouth daily as needed (for Trigeminy).   Marland Kitchen rOPINIRole (REQUIP) 0.25 MG tablet Take 1 tablet (0.25 mg total) by mouth 3 (three) times daily.  Marland Kitchen rOPINIRole (REQUIP) 4 MG tablet Take 4 mg by mouth at bedtime.  . topiramate (TOPAMAX) 100 MG tablet Take 100 mg by mouth 2 (two) times daily.  . traZODone (DESYREL) 150 MG tablet Take 150 mg by mouth daily.     Allergies:   Carbidopa-levodopa and Venlafaxine   Social History   Socioeconomic History  . Marital status: Married    Spouse name: Not on file  . Number of children: Not on file  . Years of education: Not on file  . Highest education level: Not on file  Occupational History  . Not on file  Social Needs  . Financial resource strain: Not on file  . Food insecurity    Worry: Not on file    Inability: Not on file  . Transportation needs    Medical: Not on file    Non-medical: Not on file  Tobacco Use  . Smoking status: Former Research scientist (life sciences)  . Smokeless tobacco: Never Used  Substance and Sexual Activity  . Alcohol use: No  . Drug use: No  . Sexual activity: Not on file    Comment: e-cig  Lifestyle  . Physical activity    Days per week: Not on file    Minutes per session: Not on file  . Stress: Not on file  Relationships  . Social Herbalist on phone: Not on file    Gets together: Not on file    Attends religious service: Not on file    Active member of club or organization: Not on file    Attends meetings of clubs or organizations: Not on file    Relationship status: Not on file  Other Topics Concern  . Not on file  Social History Narrative  . Not on file     Family History: The patient's family history includes Asthma in her mother; COPD in her mother; Congestive Heart Failure in her father; Diabetes in her mother; Macular degeneration in her  mother.  ROS:   Please see the history of present illness.    All other systems reviewed and are negative.  EKGs/Labs/Other Studies Reviewed:    The following studies were reviewed today: EKG reveals sinus rhythm and nonspecific ST-T changes   Recent Labs: No results found for requested labs within last 8760 hours.  Recent Lipid Panel No results found for: CHOL, TRIG, HDL, CHOLHDL, VLDL, LDLCALC, LDLDIRECT  Physical Exam:    VS:  BP (!) 168/88   Pulse 85   Ht 5\' 3"  (1.6 m)   Wt 129 lb (58.5 kg)   SpO2 100%   BMI 22.85 kg/m  Wt Readings from Last 3 Encounters:  09/05/19 129 lb (58.5 kg)  04/23/17 160 lb (72.6 kg)  11/29/14 169 lb 8 oz (76.9 kg)     GEN: Patient is in no acute distress HEENT: Normal NECK: No JVD; No carotid bruits LYMPHATICS: No lymphadenopathy CARDIAC: S1 S2 regular, 2/6 systolic murmur at the apex. RESPIRATORY:  Clear to auscultation without rales, wheezing or rhonchi  ABDOMEN: Soft, non-tender, non-distended MUSCULOSKELETAL:  No edema; No deformity  SKIN: Warm and dry NEUROLOGIC:  Alert and oriented x 3 PSYCHIATRIC:  Normal affect    Signed, Jenean Lindau, MD  09/05/2019 11:22 AM    [facility]

## 2019-09-05 NOTE — H&P (View-Only) (Signed)
Cardiology Office Note:    Date:  09/05/2019   ID:  Meghan Terry, DOB Mar 06, 1961, MRN [id number]  PCP:  Garwin Brothers, MD  Cardiologist:  Jenean Lindau, MD   Referring MD: Garwin Brothers, MD    ASSESSMENT:    1. Essential hypertension   2. Angina pectoris ([place])   3. Diabetes mellitus due to underlying condition with unspecified complications ([place])   4. Ex-smoker   5. Family history of coronary artery disease    PLAN:    In order of problems listed above:  1. Angina pectoris: The patient symptoms are very concerning.In view of the patient's symptoms, I discussed with the patient options for evaluation. Invasive and noninvasive options were given to the patient. I discussed stress testing and coronary angiography and left heart catheterization at length. Benefits, pros and cons of each approach were discussed at length. Patient had multiple questions which were answered to the patient's satisfaction. Patient opted for invasive evaluation and we will set up for coronary angiography and left heart catheterization. Further recommendations will be made based on the findings with coronary angiography. In the interim if the patient has any significant symptoms in hospital to the nearest emergency room. 2. Sublingual nitroglycerin prescription was sent, its protocol and 911 protocol explained and the patient vocalized understanding questions were answered to the patient's satisfaction 3. She was also advised to take a coated baby aspirin on a regular basis. 4. Essential hypertension: Blood pressure is stable.  She has an element of whitecoat hypertension 5. Diabetes mellitus: Managed by her primary care physician. 6. I am not aware of her lipid profile this is managed by her primary care physician. 7. Ex-smoker: She promises to never go back to smoking.  Medication Adjustments/Labs and Tests Ordered: Current medicines are reviewed at length with the patient today.  Concerns regarding medicines are  outlined above.  Orders Placed This Encounter  Procedures  . EKG 12-Lead   Meds ordered this encounter  Medications  . aspirin EC 81 MG tablet    Sig: Take 1 tablet (81 mg total) by mouth daily.    Dispense:  90 tablet    Refill:  3     History of Present Illness:    Meghan Terry is a 58 y.o. female who is being seen today for the evaluation of chest pain at the request of Garwin Brothers, MD.  Patient is a pleasant 58 year old female.  She has past medical history of essential hypertension, diabetes mellitus dyslipidemia and history of significant smoking.  She quit smoking but she vapes.  She has strong family history of coronary artery disease.  Patient has been referred here for chest pain.  She tells me that when she exerts herself she has crushing chest pain radiating to the neck or into the arm.  She stops and feels better.  For this reason she was referred.  At the time of my evaluation, the patient is alert awake oriented and in no distress.  She does not exercise on a regular basis but very active at work.  Past Medical History:  Diagnosis Date  . Anxiety   . Hypothyroid   . RLS (restless legs syndrome)   . Thyroid disease     Past Surgical History:  Procedure Laterality Date  . ABDOMINAL HYSTERECTOMY    . APPENDECTOMY    . CHOLECYSTECTOMY    . COLONOSCOPY    . KNEE ARTHROSCOPY    . 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 TUBAL LIGATION      Current Medications: Current Meds  Medication Sig  . buPROPion (WELLBUTRIN XL) 300 MG 24 hr tablet Take 1 tablet (300 mg total) by mouth every morning.  Marland Kitchen levothyroxine (SYNTHROID, LEVOTHROID) 75 MCG tablet Take 75 mcg by mouth daily before breakfast.  . losartan (COZAAR) 50 MG tablet Take 50 mg  by mouth every morning.  . metFORMIN (GLUCOPHAGE) 500 MG tablet SMARTSIG:1 Tablet(s) By Mouth Morning-Night  . metoprolol tartrate (LOPRESSOR) 25 MG tablet Take 25 mg by mouth daily as needed (for Trigeminy).   Marland Kitchen rOPINIRole (REQUIP) 0.25 MG tablet Take 1 tablet (0.25 mg total) by mouth 3 (three) times daily.  Marland Kitchen rOPINIRole (REQUIP) 4 MG tablet Take 4 mg by mouth at bedtime.  . topiramate (TOPAMAX) 100 MG tablet Take 100 mg by mouth 2 (two) times daily.  . traZODone (DESYREL) 150 MG tablet Take 150 mg by mouth daily.     Allergies:   Carbidopa-levodopa and Venlafaxine   Social History   Socioeconomic History  . Marital status: Married    Spouse name: Not on file  . Number of children: Not on file  . Years of education: Not on file  . Highest education level: Not on file  Occupational History  . Not on file  Social Needs  . Financial resource strain: Not on file  . Food insecurity    Worry: Not on file    Inability: Not on file  . Transportation needs    Medical: Not on file    Non-medical: Not on file  Tobacco Use  . Smoking status: Former Research scientist (life sciences)  . Smokeless tobacco: Never Used  Substance and Sexual Activity  . Alcohol use: No  . Drug use: No  . Sexual activity: Not on file    Comment: e-cig  Lifestyle  . Physical activity    Days per week: Not on file    Minutes per session: Not on file  . Stress: Not on file  Relationships  . Social Herbalist on phone: Not on file    Gets together: Not on file    Attends religious service: Not on file    Active member of club or organization: Not on file    Attends meetings of clubs or organizations: Not on file    Relationship status: Not on file  Other Topics Concern  . Not on file  Social History Narrative  . Not on file     Family History: The patient's family history includes Asthma in her mother; COPD in her mother; Congestive Heart Failure in her father; Diabetes in her mother; Macular degeneration in her  mother.  ROS:   Please see the history of present illness.    All other systems reviewed and are negative.  EKGs/Labs/Other Studies Reviewed:    The following studies were reviewed today: EKG reveals sinus rhythm and nonspecific ST-T changes   Recent Labs: No results found for requested labs within last 8760 hours.  Recent Lipid Panel No results found for: CHOL, TRIG, HDL, CHOLHDL, VLDL, LDLCALC, LDLDIRECT  Physical Exam:    VS:  BP (!) 168/88   Pulse 85   Ht 5\' 3"  (1.6 m)   Wt 129 lb (58.5 kg)   SpO2 100%   BMI 22.85 kg/m  Wt Readings from Last 3 Encounters:  09/05/19 129 lb (58.5 kg)  04/23/17 160 lb (72.6 kg)  11/29/14 169 lb 8 oz (76.9 kg)     GEN: Patient is in no acute distress HEENT: Normal NECK: No JVD; No carotid bruits LYMPHATICS: No lymphadenopathy CARDIAC: S1 S2 regular, 2/6 systolic murmur at the apex. RESPIRATORY:  Clear to auscultation without rales, wheezing or rhonchi  ABDOMEN: Soft, non-tender, non-distended MUSCULOSKELETAL:  No edema; No deformity  SKIN: Warm and dry NEUROLOGIC:  Alert and oriented x 3 PSYCHIATRIC:  Normal affect    Signed, Jenean Lindau, MD  09/05/2019 11:22 AM    [facility]

## 2019-09-06 ENCOUNTER — Telehealth: Payer: Self-pay | Admitting: Cardiology

## 2019-09-06 ENCOUNTER — Telehealth: Payer: Self-pay

## 2019-09-06 MED ORDER — NITROGLYCERIN 0.4 MG SL SUBL: 0.4000 mg | SUBLINGUAL_TABLET | SUBLINGUAL | 3 refills | Status: DC | PRN

## 2019-09-06 NOTE — Telephone Encounter (Signed)
°*  STAT* If patient is at the pharmacy, call can be transferred to refill team.   1. Which medications need to be refilled? (please list name of each medication and dose if known) nitroglycerin  2. Which pharmacy/location (including street and city if local pharmacy) is medication to be sent to?[facility]  3. Do they need a 30 day or 90 day supply? [place]

## 2019-09-06 NOTE — Telephone Encounter (Signed)
Left message for patient to call office concerning chest xray.

## 2019-09-06 NOTE — Telephone Encounter (Signed)
-----   Message from Jenean Lindau, MD sent at 09/06/2019  8:15 AM EST ----- Let him know about the results.  Patient has aortic atherosclerosis. ----- Message ----- From: [name], MD Sent: 09/06/2019  12:36 AM EST To: Jenean Lindau, MD

## 2019-09-06 NOTE — Addendum Note (Signed)
Addended by: Beckey Rutter on: 09/06/2019 05:56 PM   Modules accepted: Orders

## 2019-09-07 ENCOUNTER — Telehealth: Payer: Self-pay | Admitting: Cardiology

## 2019-09-07 ENCOUNTER — Encounter: Payer: Self-pay | Admitting: Cardiology

## 2019-09-07 NOTE — Telephone Encounter (Signed)
error 

## 2019-09-07 NOTE — Telephone Encounter (Signed)
Patient returned call, please call her back.

## 2019-09-07 NOTE — Telephone Encounter (Signed)
Patient returning call for results 

## 2019-09-07 NOTE — Telephone Encounter (Signed)
Spoke with the pt and she verbalized understanding. Cath 09/14/19.

## 2019-09-07 NOTE — Telephone Encounter (Signed)
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 She would like for her results to be left on her voicemail

## 2019-09-07 NOTE — Telephone Encounter (Signed)
LMTCB on home number provided.

## 2019-09-11 ENCOUNTER — Other Ambulatory Visit (HOSPITAL_COMMUNITY)
Admission: RE | Admit: 2019-09-11 | Discharge: 2019-09-11 | Disposition: A | Discharge status: Home / Self Care | Payer: Commercial Managed Care - PPO | Source: Ambulatory Visit | Attending: Interventional Cardiology | Admitting: Interventional Cardiology

## 2019-09-11 DIAGNOSIS — Z01812 Encounter for preprocedural laboratory examination: Secondary | ICD-10-CM | POA: Insufficient documentation

## 2019-09-11 DIAGNOSIS — Z20828 Contact with and (suspected) exposure to other viral communicable diseases: Secondary | ICD-10-CM | POA: Insufficient documentation

## 2019-09-12 ENCOUNTER — Telehealth: Payer: Self-pay | Admitting: *Deleted

## 2019-09-12 LAB — NOVEL CORONAVIRUS, NAA (HOSP ORDER, SEND-OUT TO REF LAB; TAT 18-24 HRS): SARS-CoV-2, NAA: NOT DETECTED

## 2019-09-12 MED FILL — LEVOTHYROXINE 50 MCG TABLET: 50 | 89 days supply | Qty: 24 | Fill #0

## 2019-09-12 MED FILL — LEVOTHYROXINE 75 MCG TABLET: 75 | 89 days supply | Qty: 64 | Fill #0

## 2019-09-12 NOTE — Telephone Encounter (Signed)
Pt contacted pre-catheterization scheduled at [facility] for: Thursday December 17,2020 7:30 AM Verified arrival time and place: [facility]) at: 5:30 AM   No solid food after midnight prior to cath, clear liquids until 5 AM day of procedure. Contrast allergy: no  Hold: Losartan-day before and day of procedure Metformin-day of procedure and 48 hours post procedure  Except hold medications AM meds can be  taken pre-cath with sip of water including: ASA 81 mg   Confirmed patient has responsible adult to drive home post procedure and observe 24 hours after arriving home: yes  Currently, due to Covid-19 pandemic, only one support person will be allowed with patient. Must be the same support person for that patient's entire stay, will be screened and required to wear a mask. They will be asked to wait in the waiting room for the duration of the patient's stay.  Patients are required to wear a mask when they enter the hospital.      COVID-19 Pre-Screening Questions:  . In the past 7 to 10 days have you had a cough,  shortness of breath, headache, congestion, fever (100 or greater) body aches, chills, sore throat, or sudden loss of taste or sense of smell? no . Have you been around anyone with known Covid 19? no . Have you been around anyone who is awaiting Covid 19 test results in the past 7 to 10 days? no . Have you been around anyone who has been exposed to Covid 19, or has mentioned symptoms of Covid 19 within the past 7 to 10 days? no   I reviewed procedure/mask/visitor instructions, Covid-19 screening questions with patient, she verbalized understanding, thanked me for call.  BMP/CBC done 09/05/19 scanned in Epic-GFR 38. Pt had BMP done 09/08/19 at [facility], will contact [facility] to get copy of results.

## 2019-09-14 ENCOUNTER — Inpatient Hospital Stay (HOSPITAL_COMMUNITY): Payer: Commercial Managed Care - PPO

## 2019-09-14 ENCOUNTER — Other Ambulatory Visit: Payer: Self-pay

## 2019-09-14 ENCOUNTER — Other Ambulatory Visit: Payer: Self-pay | Admitting: *Deleted

## 2019-09-14 ENCOUNTER — Encounter (HOSPITAL_COMMUNITY)
Admission: AD | Disposition: A | Discharge status: Home / Self Care | Payer: Self-pay | Source: Home / Self Care | Attending: Surgery

## 2019-09-14 ENCOUNTER — Inpatient Hospital Stay (HOSPITAL_COMMUNITY)
Admission: AD | Admit: 2019-09-14 | Discharge: 2019-09-19 | DRG: 234 | Disposition: A | Discharge status: Home / Self Care | Payer: Commercial Managed Care - PPO | Attending: Surgery | Admitting: Surgery

## 2019-09-14 DIAGNOSIS — G2581 Restless legs syndrome: Secondary | ICD-10-CM | POA: Diagnosis present

## 2019-09-14 DIAGNOSIS — Z79899 Other long term (current) drug therapy: Secondary | ICD-10-CM

## 2019-09-14 DIAGNOSIS — Z833 Family history of diabetes mellitus: Secondary | ICD-10-CM

## 2019-09-14 DIAGNOSIS — Z888 Allergy status to other drugs, medicaments and biological substances status: Secondary | ICD-10-CM | POA: Diagnosis not present

## 2019-09-14 DIAGNOSIS — I10 Essential (primary) hypertension: Secondary | ICD-10-CM | POA: Diagnosis present

## 2019-09-14 DIAGNOSIS — I2510 Atherosclerotic heart disease of native coronary artery without angina pectoris: Secondary | ICD-10-CM

## 2019-09-14 DIAGNOSIS — E039 Hypothyroidism, unspecified: Secondary | ICD-10-CM | POA: Diagnosis present

## 2019-09-14 DIAGNOSIS — Z7901 Long term (current) use of anticoagulants: Secondary | ICD-10-CM | POA: Diagnosis not present

## 2019-09-14 DIAGNOSIS — I25119 Atherosclerotic heart disease of native coronary artery with unspecified angina pectoris: Secondary | ICD-10-CM

## 2019-09-14 DIAGNOSIS — Z20828 Contact with and (suspected) exposure to other viral communicable diseases: Secondary | ICD-10-CM | POA: Diagnosis present

## 2019-09-14 DIAGNOSIS — Z01810 Encounter for preprocedural cardiovascular examination: Secondary | ICD-10-CM

## 2019-09-14 DIAGNOSIS — Z825 Family history of asthma and other chronic lower respiratory diseases: Secondary | ICD-10-CM

## 2019-09-14 DIAGNOSIS — E785 Hyperlipidemia, unspecified: Secondary | ICD-10-CM | POA: Diagnosis present

## 2019-09-14 DIAGNOSIS — F17290 Nicotine dependence, other tobacco product, uncomplicated: Secondary | ICD-10-CM | POA: Diagnosis present

## 2019-09-14 DIAGNOSIS — E119 Type 2 diabetes mellitus without complications: Secondary | ICD-10-CM | POA: Diagnosis not present

## 2019-09-14 DIAGNOSIS — I209 Angina pectoris, unspecified: Secondary | ICD-10-CM

## 2019-09-14 DIAGNOSIS — I25110 Atherosclerotic heart disease of native coronary artery with unstable angina pectoris: Principal | ICD-10-CM

## 2019-09-14 DIAGNOSIS — Z951 Presence of aortocoronary bypass graft: Secondary | ICD-10-CM

## 2019-09-14 DIAGNOSIS — Z79890 Hormone replacement therapy (postmenopausal): Secondary | ICD-10-CM | POA: Diagnosis not present

## 2019-09-14 DIAGNOSIS — Z8249 Family history of ischemic heart disease and other diseases of the circulatory system: Secondary | ICD-10-CM

## 2019-09-14 DIAGNOSIS — D62 Acute posthemorrhagic anemia: Secondary | ICD-10-CM | POA: Diagnosis not present

## 2019-09-14 DIAGNOSIS — E088 Diabetes mellitus due to underlying condition with unspecified complications: Secondary | ICD-10-CM

## 2019-09-14 DIAGNOSIS — Z87891 Personal history of nicotine dependence: Secondary | ICD-10-CM

## 2019-09-14 HISTORY — DX: Essential (primary) hypertension: I10

## 2019-09-14 HISTORY — PX: LEFT HEART CATH AND CORONARY ANGIOGRAPHY: CATH118249

## 2019-09-14 HISTORY — DX: Atherosclerotic heart disease of native coronary artery without angina pectoris: I25.10

## 2019-09-14 LAB — BASIC METABOLIC PANEL
Anion gap: 7 (ref 5–15)
BUN: 17 mg/dL (ref 6–20)
CO2: 22 mmol/L (ref 22–32)
Calcium: 8.7 mg/dL — ABNORMAL LOW (ref 8.9–10.3)
Chloride: 111 mmol/L (ref 98–111)
Creatinine, Ser: 1.43 mg/dL — ABNORMAL HIGH (ref 0.44–1.00)
GFR calc Af Amer: 46 mL/min — ABNORMAL LOW (ref >60)
GFR calc non Af Amer: 40 mL/min — ABNORMAL LOW (ref >60)
Glucose, Bld: 196 mg/dL — ABNORMAL HIGH (ref 70–99)
Potassium: 3.9 mmol/L (ref 3.5–5.1)
Sodium: 140 mmol/L (ref 135–145)

## 2019-09-14 LAB — ECHOCARDIOGRAM COMPLETE
Height: 63 in
Weight: 2075.2 oz

## 2019-09-14 LAB — CBC
HCT: 37.6 % (ref 36.0–46.0)
Hemoglobin: 12.2 g/dL (ref 12.0–15.0)
MCH: 29.5 pg (ref 26.0–34.0)
MCHC: 32.4 g/dL (ref 30.0–36.0)
MCV: 90.8 fL (ref 80.0–100.0)
Platelets: 264 10*3/uL (ref 150–400)
RBC: 4.14 MIL/uL (ref 3.87–5.11)
RDW: 12.3 % (ref 11.5–15.5)
WBC: 6.3 10*3/uL (ref 4.0–10.5)
nRBC: 0 % (ref 0.0–0.2)

## 2019-09-14 LAB — GLUCOSE, CAPILLARY
Glucose-Capillary: 117 mg/dL — ABNORMAL HIGH (ref 70–99)
Glucose-Capillary: 157 mg/dL — ABNORMAL HIGH (ref 70–99)
Glucose-Capillary: 90 mg/dL (ref 70–99)
Glucose-Capillary: 129 mg/dL — ABNORMAL HIGH (ref 70–99)

## 2019-09-14 LAB — ABO/RH: ABO/RH(D): A POS

## 2019-09-14 LAB — COMPREHENSIVE METABOLIC PANEL
ALT: 12 U/L (ref 0–44)
AST: 14 U/L — ABNORMAL LOW (ref 15–41)
Albumin: 3 g/dL — ABNORMAL LOW (ref 3.5–5.0)
Alkaline Phosphatase: 63 U/L (ref 38–126)
Anion gap: 6 (ref 5–15)
BUN: 15 mg/dL (ref 6–20)
CO2: 19 mmol/L — ABNORMAL LOW (ref 22–32)
Calcium: 8.1 mg/dL — ABNORMAL LOW (ref 8.9–10.3)
Chloride: 112 mmol/L — ABNORMAL HIGH (ref 98–111)
Creatinine, Ser: 1.35 mg/dL — ABNORMAL HIGH (ref 0.44–1.00)
GFR calc Af Amer: 49 mL/min — ABNORMAL LOW (ref >60)
GFR calc non Af Amer: 43 mL/min — ABNORMAL LOW (ref >60)
Glucose, Bld: 134 mg/dL — ABNORMAL HIGH (ref 70–99)
Potassium: 3.8 mmol/L (ref 3.5–5.1)
Sodium: 137 mmol/L (ref 135–145)
Total Bilirubin: 0.6 mg/dL (ref 0.3–1.2)
Total Protein: 5.2 g/dL — ABNORMAL LOW (ref 6.5–8.1)

## 2019-09-14 LAB — PULMONARY FUNCTION TEST
FEF 25-75 Pre: 1.81 L/sec
FEF2575-%Pred-Pre: 79 %
FEV1-%Pred-Pre: 75 %
FEV1-Pre: 1.87 L
FEV1FVC-%Pred-Pre: 102 %
FEV6-%Pred-Pre: 75 %
FEV6-Pre: 2.31 L
FEV6FVC-%Pred-Pre: 102 %
FVC-%Pred-Pre: 73 %
FVC-Pre: 2.33 L
Pre FEV1/FVC ratio: 80 %
Pre FEV6/FVC Ratio: 99 %

## 2019-09-14 LAB — BLOOD GAS, ARTERIAL
Acid-base deficit: 4.9 mmol/L — ABNORMAL HIGH (ref 0.0–2.0)
Bicarbonate: 19.9 mmol/L — ABNORMAL LOW (ref 20.0–28.0)
Drawn by: 51133
FIO2: 21
O2 Saturation: 98.3 %
Patient temperature: 37
pCO2 arterial: 38.3 mmHg (ref 32.0–48.0)
pH, Arterial: 7.336 — ABNORMAL LOW (ref 7.350–7.450)
pO2, Arterial: 116 mmHg — ABNORMAL HIGH (ref 83.0–108.0)

## 2019-09-14 LAB — HEMOGLOBIN A1C
Hgb A1c MFr Bld: 5.8 % — ABNORMAL HIGH (ref 4.8–5.6)
Mean Plasma Glucose: 119.76 mg/dL

## 2019-09-14 LAB — HEPARIN LEVEL (UNFRACTIONATED): Heparin Unfractionated: <0.1 IU/mL — ABNORMAL LOW (ref 0.30–0.70)

## 2019-09-14 SURGERY — LEFT HEART CATH AND CORONARY ANGIOGRAPHY
Anesthesia: LOCAL

## 2019-09-14 MED ORDER — SODIUM CHLORIDE 0.9 % IV SOLN
INTRAVENOUS | Status: DC
  Filled 2019-09-14: qty 30

## 2019-09-14 MED ORDER — PHENYLEPHRINE HCL-NACL 20-0.9 MG/250ML-% IV SOLN
30.0000 ug/min | INTRAVENOUS | Status: DC
  Filled 2019-09-14: qty 250

## 2019-09-14 MED ORDER — FENTANYL CITRATE (PF) 100 MCG/2ML IJ SOLN
INTRAMUSCULAR | Status: AC
  Filled 2019-09-14: qty 2

## 2019-09-14 MED ORDER — VANCOMYCIN HCL 1250 MG/250ML IV SOLN
1250.0000 mg | INTRAVENOUS | Status: DC
  Filled 2019-09-14: qty 250

## 2019-09-14 MED ORDER — SODIUM CHLORIDE 0.9 % IV SOLN: INTRAVENOUS | Status: AC

## 2019-09-14 MED ORDER — LEVOTHYROXINE SODIUM 75 MCG PO TABS
75.0000 ug | ORAL_TABLET | Freq: Every day | ORAL | Status: DC
Start: 2019-09-14 — End: 2019-09-19
  Administered 2019-09-15 – 2019-09-19 (×5): 75 ug via ORAL
  Filled 2019-09-14 (×5): qty 1

## 2019-09-14 MED ORDER — LIDOCAINE HCL (PF) 1 % IJ SOLN
INTRAMUSCULAR | Status: DC | PRN
  Administered 2019-09-14: 2 mL via INTRADERMAL

## 2019-09-14 MED ORDER — INSULIN ASPART 100 UNIT/ML ~~LOC~~ SOLN: 0.0000 [IU] | Freq: Three times a day (TID) | SUBCUTANEOUS | Status: DC

## 2019-09-14 MED ORDER — TOPIRAMATE 100 MG PO TABS
100.0000 mg | ORAL_TABLET | Freq: Two times a day (BID) | ORAL | Status: DC
  Administered 2019-09-14 – 2019-09-19 (×9): 100 mg via ORAL
  Filled 2019-09-14 (×7): qty 1
  Filled 2019-09-14: qty 4
  Filled 2019-09-14 (×7): qty 1

## 2019-09-14 MED ORDER — LOSARTAN POTASSIUM 50 MG PO TABS
50.0000 mg | ORAL_TABLET | Freq: Every morning | ORAL | Status: DC
  Administered 2019-09-14: 50 mg via ORAL
  Filled 2019-09-14: qty 1

## 2019-09-14 MED ORDER — INSULIN REGULAR(HUMAN) IN NACL 100-0.9 UT/100ML-% IV SOLN
INTRAVENOUS | Status: DC
  Filled 2019-09-14 (×2): qty 100

## 2019-09-14 MED ORDER — SODIUM CHLORIDE 0.9% FLUSH: 3.0000 mL | Freq: Two times a day (BID) | INTRAVENOUS | Status: DC

## 2019-09-14 MED ORDER — ASPIRIN 81 MG PO CHEW: 81.0000 mg | CHEWABLE_TABLET | Freq: Every day | ORAL | Status: DC

## 2019-09-14 MED ORDER — ASPIRIN 81 MG PO CHEW: 81.0000 mg | CHEWABLE_TABLET | ORAL | Status: DC

## 2019-09-14 MED ORDER — SODIUM CHLORIDE 0.9 % WEIGHT BASED INFUSION
3.0000 mL/kg/h | INTRAVENOUS | Status: DC
  Administered 2019-09-14: 3 mL/kg/h via INTRAVENOUS

## 2019-09-14 MED ORDER — ALPRAZOLAM 0.25 MG PO TABS: 0.2500 mg | ORAL_TABLET | ORAL | Status: DC | PRN

## 2019-09-14 MED ORDER — SODIUM CHLORIDE 0.9% FLUSH: 3.0000 mL | INTRAVENOUS | Status: DC | PRN

## 2019-09-14 MED ORDER — HYDRALAZINE HCL 20 MG/ML IJ SOLN: 10.0000 mg | INTRAMUSCULAR | Status: AC | PRN

## 2019-09-14 MED ORDER — POTASSIUM CHLORIDE 2 MEQ/ML IV SOLN
80.0000 meq | INTRAVENOUS | Status: DC
  Filled 2019-09-14: qty 40

## 2019-09-14 MED ORDER — MIDAZOLAM HCL 2 MG/2ML IJ SOLN
INTRAMUSCULAR | Status: DC | PRN
  Administered 2019-09-14: 1 mg via INTRAVENOUS
  Administered 2019-09-14: 2 mg via INTRAVENOUS

## 2019-09-14 MED ORDER — ACETAMINOPHEN 325 MG PO TABS: 650.0000 mg | ORAL_TABLET | ORAL | Status: DC | PRN

## 2019-09-14 MED ORDER — ONDANSETRON HCL 4 MG/2ML IJ SOLN: 4.0000 mg | Freq: Four times a day (QID) | INTRAMUSCULAR | Status: DC | PRN

## 2019-09-14 MED ORDER — TRANEXAMIC ACID (OHS) BOLUS VIA INFUSION
15.0000 mg/kg | INTRAVENOUS | Status: AC
  Administered 2019-09-15: 882 mg via INTRAVENOUS
  Filled 2019-09-14: qty 882

## 2019-09-14 MED ORDER — HEPARIN (PORCINE) IN NACL 1000-0.9 UT/500ML-% IV SOLN
INTRAVENOUS | Status: AC
  Filled 2019-09-14: qty 1000

## 2019-09-14 MED ORDER — HEPARIN SODIUM (PORCINE) 1000 UNIT/ML IJ SOLN
INTRAMUSCULAR | Status: DC | PRN
  Administered 2019-09-14: 3000 [IU] via INTRAVENOUS

## 2019-09-14 MED ORDER — ROPINIROLE HCL 0.25 MG PO TABS: 0.2500 mg | ORAL_TABLET | Freq: Three times a day (TID) | ORAL | Status: DC

## 2019-09-14 MED ORDER — CHLORHEXIDINE GLUCONATE 0.12 % MT SOLN
15.0000 mL | Freq: Once | OROMUCOSAL | Status: AC
  Administered 2019-09-15: 15 mL via OROMUCOSAL
  Filled 2019-09-14: qty 15

## 2019-09-14 MED ORDER — EPINEPHRINE HCL 5 MG/250ML IV SOLN IN NS
0.0000 ug/min | INTRAVENOUS | Status: DC
  Filled 2019-09-14: qty 250

## 2019-09-14 MED ORDER — IOHEXOL 350 MG/ML SOLN
INTRAVENOUS | Status: DC | PRN
  Administered 2019-09-14: 55 mL via INTRA_ARTERIAL

## 2019-09-14 MED ORDER — TRANEXAMIC ACID (OHS) PUMP PRIME SOLUTION
2.0000 mg/kg | INTRAVENOUS | Status: DC
  Filled 2019-09-14: qty 1.18

## 2019-09-14 MED ORDER — DEXMEDETOMIDINE HCL IN NACL 400 MCG/100ML IV SOLN
0.1000 ug/kg/h | INTRAVENOUS | Status: DC
  Filled 2019-09-14: qty 100

## 2019-09-14 MED ORDER — NITROGLYCERIN 0.4 MG SL SUBL: 0.4000 mg | SUBLINGUAL_TABLET | SUBLINGUAL | Status: DC | PRN

## 2019-09-14 MED ORDER — NOREPINEPHRINE 4 MG/250ML-% IV SOLN
0.0000 ug/min | INTRAVENOUS | Status: DC
  Filled 2019-09-14: qty 250

## 2019-09-14 MED ORDER — LABETALOL HCL 5 MG/ML IV SOLN: 10.0000 mg | INTRAVENOUS | Status: AC | PRN

## 2019-09-14 MED ORDER — VERAPAMIL HCL 2.5 MG/ML IV SOLN
INTRAVENOUS | Status: AC
  Filled 2019-09-14: qty 2

## 2019-09-14 MED ORDER — VERAPAMIL HCL 2.5 MG/ML IV SOLN
INTRAVENOUS | Status: DC | PRN
  Administered 2019-09-14: 10 mL via INTRA_ARTERIAL

## 2019-09-14 MED ORDER — ASPIRIN EC 81 MG PO TBEC: 81.0000 mg | DELAYED_RELEASE_TABLET | Freq: Every day | ORAL | Status: DC

## 2019-09-14 MED ORDER — SODIUM CHLORIDE 0.9 % WEIGHT BASED INFUSION: 1.0000 mL/kg/h | INTRAVENOUS | Status: DC

## 2019-09-14 MED ORDER — METOPROLOL TARTRATE 25 MG PO TABS
25.0000 mg | ORAL_TABLET | Freq: Every day | ORAL | Status: DC
  Administered 2019-09-14: 25 mg via ORAL
  Filled 2019-09-14: qty 1

## 2019-09-14 MED ORDER — SODIUM CHLORIDE 0.9 % IV SOLN
1.5000 g | INTRAVENOUS | Status: AC
  Administered 2019-09-15: .75 g via INTRAVENOUS
  Administered 2019-09-15: 1.5 g via INTRAVENOUS
  Filled 2019-09-14: qty 1.5

## 2019-09-14 MED ORDER — SODIUM CHLORIDE 0.9 % IV SOLN: 250.0000 mL | INTRAVENOUS | Status: DC | PRN

## 2019-09-14 MED ORDER — MIDAZOLAM HCL 2 MG/2ML IJ SOLN
INTRAMUSCULAR | Status: AC
  Filled 2019-09-14: qty 2

## 2019-09-14 MED ORDER — HEPARIN (PORCINE) 25000 UT/250ML-% IV SOLN
1000.0000 [IU]/h | INTRAVENOUS | Status: DC
  Administered 2019-09-14: 800 [IU]/h via INTRAVENOUS
  Filled 2019-09-14: qty 250

## 2019-09-14 MED ORDER — MAGNESIUM SULFATE 50 % IJ SOLN
40.0000 meq | INTRAMUSCULAR | Status: DC
  Filled 2019-09-14: qty 9.85

## 2019-09-14 MED ORDER — DIAZEPAM 5 MG PO TABS
5.0000 mg | ORAL_TABLET | Freq: Once | ORAL | Status: AC
  Administered 2019-09-15: 5 mg via ORAL
  Filled 2019-09-14: qty 1

## 2019-09-14 MED ORDER — CHLORHEXIDINE GLUCONATE CLOTH 2 % EX PADS: 6.0000 | MEDICATED_PAD | Freq: Once | CUTANEOUS | Status: DC

## 2019-09-14 MED ORDER — PLASMA-LYTE 148 IV SOLN
INTRAVENOUS | Status: DC
  Filled 2019-09-14: qty 2.5

## 2019-09-14 MED ORDER — MILRINONE LACTATE IN DEXTROSE 20-5 MG/100ML-% IV SOLN
0.3000 ug/kg/min | INTRAVENOUS | Status: DC
  Filled 2019-09-14: qty 100

## 2019-09-14 MED ORDER — HEPARIN SODIUM (PORCINE) 1000 UNIT/ML IJ SOLN
INTRAMUSCULAR | Status: AC
  Filled 2019-09-14: qty 1

## 2019-09-14 MED ORDER — BUPROPION HCL ER (XL) 300 MG PO TB24
300.0000 mg | ORAL_TABLET | Freq: Every day | ORAL | Status: DC
  Administered 2019-09-16 – 2019-09-19 (×4): 300 mg via ORAL
  Filled 2019-09-14 (×4): qty 1
  Filled 2019-09-14 (×2): qty 2
  Filled 2019-09-14: qty 1

## 2019-09-14 MED ORDER — TEMAZEPAM 15 MG PO CAPS: 15.0000 mg | ORAL_CAPSULE | Freq: Once | ORAL | Status: DC | PRN

## 2019-09-14 MED ORDER — HEPARIN (PORCINE) IN NACL 1000-0.9 UT/500ML-% IV SOLN
INTRAVENOUS | Status: DC | PRN
  Administered 2019-09-14 (×2): 500 mL

## 2019-09-14 MED ORDER — FENTANYL CITRATE (PF) 100 MCG/2ML IJ SOLN
INTRAMUSCULAR | Status: DC | PRN
  Administered 2019-09-14 (×2): 25 ug via INTRAVENOUS

## 2019-09-14 MED ORDER — ROPINIROLE HCL 1 MG PO TABS
1.0000 mg | ORAL_TABLET | Freq: Three times a day (TID) | ORAL | Status: DC
  Administered 2019-09-14 – 2019-09-19 (×12): 1 mg via ORAL
  Filled 2019-09-14 (×15): qty 1

## 2019-09-14 MED ORDER — TRAZODONE HCL 150 MG PO TABS
150.0000 mg | ORAL_TABLET | Freq: Every day | ORAL | Status: DC
  Administered 2019-09-14 – 2019-09-18 (×5): 150 mg via ORAL
  Filled 2019-09-14 (×2): qty 3
  Filled 2019-09-14 (×3): qty 1

## 2019-09-14 MED ORDER — NITROGLYCERIN IN D5W 200-5 MCG/ML-% IV SOLN
2.0000 ug/min | INTRAVENOUS | Status: DC
  Filled 2019-09-14 (×2): qty 250

## 2019-09-14 MED ORDER — ROPINIROLE HCL 1 MG PO TABS
4.0000 mg | ORAL_TABLET | Freq: Every day | ORAL | Status: DC
  Administered 2019-09-14 – 2019-09-18 (×5): 4 mg via ORAL
  Filled 2019-09-14 (×6): qty 4

## 2019-09-14 MED ORDER — SODIUM CHLORIDE 0.9 % IV SOLN
750.0000 mg | INTRAVENOUS | Status: DC
  Filled 2019-09-14: qty 750

## 2019-09-14 MED ORDER — METOPROLOL TARTRATE 12.5 MG HALF TABLET
12.5000 mg | ORAL_TABLET | Freq: Once | ORAL | Status: AC
  Administered 2019-09-15: 12.5 mg via ORAL
  Filled 2019-09-14: qty 1

## 2019-09-14 MED ORDER — TRANEXAMIC ACID 1000 MG/10ML IV SOLN
1.5000 mg/kg/h | INTRAVENOUS | Status: DC
  Filled 2019-09-14: qty 25

## 2019-09-14 MED ORDER — BISACODYL 5 MG PO TBEC: 5.0000 mg | DELAYED_RELEASE_TABLET | Freq: Once | ORAL | Status: DC

## 2019-09-14 MED ORDER — CHLORHEXIDINE GLUCONATE CLOTH 2 % EX PADS
6.0000 | MEDICATED_PAD | Freq: Once | CUTANEOUS | Status: AC
  Administered 2019-09-14: 6 via TOPICAL

## 2019-09-14 MED ORDER — LIDOCAINE HCL (PF) 1 % IJ SOLN
INTRAMUSCULAR | Status: AC
  Filled 2019-09-14: qty 30

## 2019-09-14 SURGICAL SUPPLY — 10 items
CATH 5FR JL3.5 JR4 ANG PIG MP (CATHETERS) ×1 IMPLANT
DEVICE RAD COMP TR BAND LRG (VASCULAR PRODUCTS) ×1 IMPLANT
GLIDESHEATH SLEND SS 6F .021 (SHEATH) ×1 IMPLANT
GUIDEWIRE INQWIRE 1.5J.035X260 (WIRE) IMPLANT
INQWIRE 1.5J .035X260CM (WIRE) ×2
KIT HEART LEFT (KITS) ×2 IMPLANT
PACK CARDIAC CATHETERIZATION (CUSTOM PROCEDURE TRAY) ×2 IMPLANT
SHEATH PROBE COVER 6X72 (BAG) ×1 IMPLANT
TRANSDUCER W/STOPCOCK (MISCELLANEOUS) ×2 IMPLANT
TUBING CIL FLEX 10 FLL-RA (TUBING) ×2 IMPLANT

## 2019-09-14 NOTE — Progress Notes (Signed)
  Echocardiogram 2D Echocardiogram has been performed.  Meghan Terry 09/14/2019, 3:13 PM

## 2019-09-14 NOTE — Progress Notes (Signed)
CARDIAC REHAB PHASE I   Preop education completed with pt. Pt given IS, able to demonstrate 1000. Pt educated on importance of sternal precautions, walks, and IS use. Pt given in-the-tube sheet along with cardiac surgery booklet. Pt states her sister is coming in from out of town for post-op care. Pt denies further questions or concerns. Will continue to follow throughout her hospital stay.  BB:[id number] Rufina Falco, RN BSN 09/14/2019 2:12 PM

## 2019-09-14 NOTE — Interval H&P Note (Signed)
Cath Lab Visit (complete for each Cath Lab visit)  Clinical Evaluation Leading to the Procedure:   ACS: No.  Non-ACS:    Anginal Classification: CCS III  Anti-ischemic medical therapy: Minimal Therapy (1 class of medications)  Non-Invasive Test Results: No non-invasive testing performed  Prior CABG: No previous CABG      History and Physical Interval Note:  09/14/2019 7:38 AM  Meghan Terry  has presented today for surgery, with the diagnosis of Angina.  The various methods of treatment have been discussed with the patient and family. After consideration of risks, benefits and other options for treatment, the patient has consented to  Procedure(s): LEFT HEART CATH AND CORONARY ANGIOGRAPHY (N/A) as a surgical intervention.  The patient's history has been reviewed, patient examined, no change in status, stable for surgery.  I have reviewed the patient's chart and labs.  Questions were answered to the patient's satisfaction.     Larae Grooms

## 2019-09-14 NOTE — Progress Notes (Signed)
ANTICOAGULATION CONSULT NOTE - Initial Consult  Pharmacy Consult for IV heparin  Indication: CAD, awaiting CABG consult  Allergies  Allergen Reactions  . Prednisone     Turns red all over   . Carbidopa-Levodopa     Developed tics while taking   . Venlafaxine     Developed tics while taking     Patient Measurements: Height: 5\' 3"  (160 cm) Weight: 129 lb (58.5 kg) IBW/kg (Calculated) : 52.4 Heparin Dosing Weight: 58.5  Vital Signs: Temp: 97.8 F (36.6 C) (12/17 1010) Temp Source: Oral (12/17 1010) BP: 144/83 (12/17 1013) Pulse Rate: 85 (12/17 1013)  Labs: No results for input(s): HGB, HCT, PLT, APTT, LABPROT, INR, HEPARINUNFRC, HEPRLOWMOCWT, CREATININE, CKTOTAL, CKMB, TROPONINIHS in the last 72 hours.  CrCl cannot be calculated (Patient's most recent lab result is older than the maximum 21 days allowed.).   Medical History: Past Medical History:  Diagnosis Date  . Anxiety   . Hypothyroid   . RLS (restless legs syndrome)   . Thyroid disease     Medications:  Infusions:  . sodium chloride 75 mL/hr at 09/14/19 0828  . sodium chloride    . heparin      Assessment: 58 yo female admitted for cath, found with 3V CAD.  Pharmacy asked to begin IV heparin while waiting for TCTS consult.  No known anticoagulants PTA.  Asked to start heparin 8 hrs after sheath out - removed around 8 AM this morning.  Goal of Therapy:  Heparin level 0.3-0.7 units/ml Monitor platelets by anticoagulation protocol: Yes   Plan:  1. Start IV heparin at 800 units/hr at 4 pm today.  No bolus. 2. Check heparin level 6 hrs after gtt starts. 3. Daily heparin level and CBC. 4. F/u plans for CABG.  Marguerite Olea, [facility] Clinical Pharmacist Phone [phone]  09/14/2019 10:30 AM

## 2019-09-14 NOTE — Anesthesia Preprocedure Evaluation (Addendum)
Anesthesia Evaluation  Patient identified by MRN, date of birth, ID band Patient awake    Reviewed: Allergy & Precautions, NPO status , Patient's Chart, lab work & pertinent test results, reviewed documented beta blocker date and time   Airway Mallampati: II  TM Distance: >3 FB Neck ROM: Full    Dental  (+) Edentulous Upper, Edentulous Lower   Pulmonary former smoker,    Pulmonary exam normal breath sounds clear to auscultation       Cardiovascular hypertension, Pt. on medications and Pt. on home beta blockers + angina + CAD  Normal cardiovascular exam Rhythm:Regular Rate:Normal     Neuro/Psych  Headaches, PSYCHIATRIC DISORDERS Anxiety Depression    GI/Hepatic negative GI ROS, Neg liver ROS,   Endo/Other  diabetes, Oral Hypoglycemic AgentsHypothyroidism   Renal/GU negative Renal ROS     Musculoskeletal RLS (restless legs syndrome)   Abdominal   Peds  Hematology  (+) anemia ,   Anesthesia Other Findings CAD  Reproductive/Obstetrics                            Anesthesia Physical Anesthesia Plan  ASA: IV  Anesthesia Plan: General   Post-op Pain Management:    Induction: Intravenous  PONV Risk Score and Plan: 3 and Ondansetron, Dexamethasone, Midazolam and Treatment may vary due to age or medical condition  Airway Management Planned: Oral ETT  Additional Equipment: Arterial line, CVP, PA Cath, TEE and Ultrasound Guidance Line Placement  Intra-op Plan:   Post-operative Plan: Post-operative intubation/ventilation  Informed Consent: I have reviewed the patients History and Physical, chart, labs and discussed the procedure including the risks, benefits and alternatives for the proposed anesthesia with the patient or authorized representative who has indicated his/her understanding and acceptance.       Plan Discussed with: CRNA and Anesthesiologist  Anesthesia Plan Comments:         Anesthesia Quick Evaluation

## 2019-09-14 NOTE — Consult Note (Addendum)
[address]       [place] [address]             [phone]        Oyindamola A Ferreras [place] Medical Record [id number] Date of Birth: 08/05/1961  Referring: Ravenkar/Varanassi Primary Care: Garwin Brothers, MD Primary Cardiologist:No primary care provider on file.  Chief Complaint:  CAD  History of Present Illness:      Mrs. Bizzell is a 58 yo white female with known history of HTN, Dyslipidemia, DM, and history of significant nicotine abuse.  She previously smoked 1 ppd day for 40 years, but has been vaping for the last 4.  She notes a strong family history of CAD with a father who passed away in his 66s.  The patient presented to her PCP with complaints of crushing chest pain that radiates to her neck and down her arm.  He subsequently referred her to Dr. Julianne Rice who felt coronary angiography should be performed.  This was performed today 09/14/2019 by Dr. Irish Lack and showed preserved EF but severe 3 vessel CAD.  It was felt coronary bypass grafting would be indicated and TCTS consult was requested.  Currently the patient states she has chest pain that is resolving.  She states that she has been experiencing chest pain over the past several weeks.  This can occur with exertion and rest.  She states the pain resolves with rest and time.  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  She can do what she needs to do without limitation normally.  She states that her diabetes is much improved with last A1c being 5.1 but her doctor keeps her on low dose Metformin to prevent worsening control.  She is accompanied by her son as her husband has MS and her daughter is pregnant.  She wishes to proceed with surgery.  Current Activity/ Functional Status: Patient is independent with mobility/ambulation, transfers, ADL's, IADL's.   Zubrod Score: At the time of surgery this patient's most appropriate activity status/level should be described  as: []     0    Normal activity, no symptoms [x]     1    Restricted in physical strenuous activity but ambulatory, able to do out light work []     2    Ambulatory and capable of self care, unable to do work activities, up and about                 more than 50%  Of the time                            []     3    Only limited self care, in bed greater than 50% of waking hours []     4    Completely disabled, no self care, confined to bed or chair []     5    Moribund  Past Medical History:  Diagnosis Date  . Anxiety   . Hypothyroid   . RLS (restless legs syndrome)   . Thyroid disease     Past Surgical History:  Procedure Laterality Date  . ABDOMINAL HYSTERECTOMY    . APPENDECTOMY    . CHOLECYSTECTOMY    . COLONOSCOPY    . KNEE ARTHROSCOPY    . LEFT HEART CATH AND CORONARY ANGIOGRAPHY N/A 09/14/2019   Procedure: LEFT HEART CATH AND CORONARY ANGIOGRAPHY;  Surgeon: [name], MD;  Location: [facility];  Service: Cardiovascular;  Laterality: N/A;  . OSTEOCHONDRAL DEFECT REPAIR/RECONSTRUCTION Left 11/29/2014   Procedure: LEFT ANKLE MEDIAL MALLEOLUS OSTEOTOMY,AUTO GRAFT FROM CALCANEOUS;  OS TIBIA DENORO GRAFTING TALUS;  Surgeon: Wylene Simmer, MD;  Location: [facility];  Service: Orthopedics;  Laterality: Left;  . TUBAL LIGATION      Social History   Tobacco Use  Smoking Status Former Smoker  Smokeless Tobacco Never Used    Social History   Substance and Sexual Activity  Alcohol Use No     Allergies  Allergen Reactions  . Prednisone     Turns red all over   . Carbidopa-Levodopa     Developed tics while taking   . Venlafaxine     Developed tics while taking     Current Facility-Administered Medications  Medication Dose Route Frequency Provider Last Rate Last Admin  . 0.9 %  sodium chloride infusion   Intravenous Continuous [name], MD 75 mL/hr at 09/14/19 [id number] Rate Change at 09/14/19 0828  . 0.9 %  sodium chloride infusion  250  mL Intravenous PRN [name], MD      . acetaminophen (TYLENOL) tablet 650 mg  650 mg Oral Q4H PRN [name], MD      . Derrill Memo ON 09/15/2019] aspirin EC tablet 81 mg  81 mg Oral Daily [name], MD      . buPROPion (WELLBUTRIN XL) 24 hr tablet 300 mg  300 mg Oral Daily [name], MD      . heparin ADULT infusion 100 units/mL (25000 units/224mL sodium chloride 0.45%)  800 Units/hr Intravenous Continuous Carney, Jessica C, RPH      . hydrALAZINE (APRESOLINE) injection 10 mg  10 mg Intravenous Q20 Min PRN [name], MD      . insulin aspart (novoLOG) injection 0-15 Units  0-15 Units Subcutaneous TID WC [name], MD      . labetalol (NORMODYNE) injection 10 mg  10 mg Intravenous Q10 min PRN [name], MD      . levothyroxine (SYNTHROID) tablet 75 mcg  75 mcg Oral QAC breakfast [name], MD      . losartan (COZAAR) tablet 50 mg  50 mg Oral q morning - 10a [name], MD      . metoprolol tartrate (LOPRESSOR) tablet 25 mg  25 mg Oral Daily [name], MD      . nitroGLYCERIN (NITROSTAT) SL tablet 0.4 mg  0.4 mg Sublingual Q5 min PRN [name], MD      . ondansetron [facility]) injection 4 mg  4 mg Intravenous Q6H PRN [name], MD      . rOPINIRole (REQUIP) tablet 1 mg  1 mg Oral TID WC [name], MD      . rOPINIRole (REQUIP) tablet 4 mg  4 mg Oral QHS Larae Grooms S, MD      . sodium chloride flush (NS) 0.9 % injection 3 mL  3 mL Intravenous Q12H Larae Grooms S, MD      . sodium chloride flush (NS) 0.9 % injection 3 mL  3 mL Intravenous PRN [name], MD      . topiramate (TOPAMAX) tablet 100 mg  100 mg Oral BID [name], MD      . traZODone (DESYREL) tablet 150 mg  150 mg Oral QHS [name], MD  Medications Prior to Admission  Medication Sig Dispense Refill Last Dose  . aspirin EC 81 MG tablet Take 1 tablet  (81 mg total) by mouth daily. 90 tablet 3 09/14/2019 at 0430  . buPROPion (WELLBUTRIN XL) 300 MG 24 hr tablet Take 1 tablet (300 mg total) by mouth every morning. 15 tablet 0 09/13/2019 at Unknown time  . levothyroxine (SYNTHROID, LEVOTHROID) 75 MCG tablet Take 75 mcg by mouth daily before breakfast.  2 09/13/2019 at Unknown time  . losartan (COZAAR) 50 MG tablet Take 50 mg by mouth every morning.  2 09/13/2019 at Unknown time  . metFORMIN (GLUCOPHAGE) 500 MG tablet Take 500 mg by mouth 2 (two) times daily with a meal.    09/12/2019 at 0730  . metoprolol tartrate (LOPRESSOR) 25 MG tablet Take 25 mg by mouth daily.   0 09/13/2019 at Unknown time  . nitroGLYCERIN (NITROSTAT) 0.4 MG SL tablet Place 1 tablet (0.4 mg total) under the tongue every 5 (five) minutes as needed. 25 tablet 3   . rOPINIRole (REQUIP) 1 MG tablet Take 1 mg by mouth 3 (three) times daily.    09/13/2019 at unknown  . rOPINIRole (REQUIP) 4 MG tablet Take 4 mg by mouth at bedtime.   09/13/2019 at Unknown time  . topiramate (TOPAMAX) 100 MG tablet Take 100 mg by mouth 2 (two) times daily.   09/13/2019 at Unknown time  . traZODone (DESYREL) 150 MG tablet Take 150 mg by mouth at bedtime.    09/13/2019 at Unknown time    Family History  Problem Relation Age of Onset  . Asthma Mother   . COPD Mother   . Diabetes Mother   . Macular degeneration Mother   . Congestive Heart Failure Father     Review of Systems:   ROS     Cardiac Review of Systems: Y or  [    ]= no  Chest Pain [  Y, unstable with exertion and rest  ]  Resting SOB [  N ] Exertional SOB  [N  ]  Orthopnea [  ]   Pedal Edema [ N  ]    Palpitations Aqua.Slicker  ] Syncope  [  ]   Presyncope [   ]  General Review of Systems: [Y] = yes [  ]=no Constitional: recent weight change [  ]; anorexia [  ]; fatigue [ N ]; nausea Aqua.Slicker  ]; night sweats Aqua.Slicker  ]; fever [ N ]; or chills [  ]                                                               Dental: Last Dentist visit:   Eye :  blurred vision [  ]; diplopia [   ]; vision changes Aqua.Slicker  ];  Amaurosis fugax[  ]; Resp: cough Aqua.Slicker  ];  wheezing[ N ];  hemoptysis[  ]; shortness of breath[ N ]; paroxysmal nocturnal dyspnea[  ]; dyspnea on exertion[N  ]; or orthopnea[  ];  GI:  gallstones[  ], vomiting[ N ];  dysphagia[  ]; melena[  ];  hematochezia [  ]; heartburn[  ];   Hx of  Colonoscopy[  ]; GU: kidney stones [  ]; hematuria[  ];   dysuria [  ];  nocturia[  ];  history of     obstruction [  ]; urinary frequency [  ]             Skin: rash, swelling[ N ];, hair loss[  ];  peripheral edema[ N ];  or itching[  ]; Musculosketetal: myalgias[  ];  joint swelling[  ];  joint erythema[  ];  joint pain[  ];  back pain[  ];  Heme/Lymph: bruising[  ];  bleeding[  ];  anemia[  ];  Neuro: TIA[  ];  headaches[N];  stroke[ N ];  vertigo[  ];  seizures[  ];   paresthesias[  ];  difficulty walking[ N ];  Psych:depression[N  ]; anxiety[  ];  Endocrine: diabetes[ Y ];  thyroid dysfunction[N  ];  Physical Exam: BP (!) 148/63   Pulse 82   Temp 97.8 F (36.6 C) (Oral)   Resp 17   Ht 5\' 3"  (1.6 m)   Wt 58.8 kg   SpO2 100%   BMI 22.98 kg/m    General appearance: alert, cooperative and no distress Head: Normocephalic, without obvious abnormality, atraumatic Neck: no adenopathy, no carotid bruit, no JVD, supple, symmetrical, trachea midline and thyroid not enlarged, symmetric, no tenderness/mass/nodules Resp: clear to auscultation bilaterally Cardio: regular rate and rhythm GI: soft, non-tender; bowel sounds normal; no masses,  no organomegaly Extremities: extremities normal, atraumatic, no cyanosis or edema Neurologic: Grossly normal  Diagnostic Studies & Laboratory data:     Recent Radiology Findings:   CARDIAC CATHETERIZATION  Result Date: 09/14/2019  Mid RCA lesion is 75% stenosed.  Dist RCA lesion is 70% stenosed.  RPAV lesion is 75% stenosed.  1st Mrg lesion is 90% stenosed.  Prox Cx to Mid Cx lesion is 99% stenosed.  Ost  LAD to Prox LAD lesion is 80% stenosed.  Mid LAD lesion is 50% stenosed.  Dist LAD lesion is 50% stenosed.  The left ventricular systolic function is normal.  LV end diastolic pressure is normal.  The left ventricular ejection fraction is 55-65% by visual estimate.  There is no aortic valve stenosis.  Severe three vessel disease.  Cardiac surgery consult.  Will start IV heparin given subtotal circ lesion.  Results discussed with the son.     I have independently reviewed the above radiologic studies and discussed with the patient   Recent Lab Findings: Lab Results  Component Value Date   WBC 6.3 09/14/2019   HGB 12.2 09/14/2019   HCT 37.6 09/14/2019   PLT 264 09/14/2019   GLUCOSE 200 (H) 04/25/2017   ALT 47 04/25/2017   AST 45 (H) 04/25/2017   NA 140 04/25/2017   K 3.6 04/25/2017   CL 108 04/25/2017   CREATININE 0.93 04/25/2017   BUN 8 04/25/2017   CO2 23 04/25/2017   HGBA1C 5.8 (H) 09/14/2019   Assessment / Plan:     1. Unstable angina, cath today with 3V CAD with preserved EF for CABG consult- patient wishes to proceed with CABG 2. DM- well controlled 3. H/O Nicotine abuse, quit but does vape for the past 4 years, encouraged patient to quit vapping 4. HTN 5. Dispo- patient with chest pain during evaluation, which was resolving, wishes to proceed with CABG, tentatively for tomorrow, continue preoperative workup.  Dr. Cyndia Bent to speak with patient later today  I  spent 55 minutes counseling the patient face to face.   Ellwood Handler, PA-C 09/14/2019 11:26 AM   Medical record and cath films reviewed, patient examined. She has severe, diffuse 3-vessel coronary  artery diseased. Her LAD is a good graftable vessel but LCX is subtotally occluded and diffusely diseased distally in all of the small branches. Her RCA has 75% mid and distal lesions before the PDA. The distal vessel also has significant disease but the two branches beyond that are small and probably not graftable. I  agree that CABG is the best option but I think the long term results for the LCX are questionable. I discussed the operative procedure with the patient  including alternatives, benefits and risks; including but not limited to bleeding, blood transfusion, infection, stroke, myocardial infarction, graft failure, heart block requiring a permanent pacemaker, organ dysfunction, and death.  [name] understands and agrees to proceed. I spent 40 minutes performing this consultation and > 50% of this time was spent face to face counseling and coordinating the care of this patient's severe multivessel coronary artery disease.

## 2019-09-14 NOTE — Progress Notes (Signed)
Pre CABG testing       has been completed. Preliminary results can be found under CV proc through chart review. June Leap, BS, RDMS, RVT

## 2019-09-14 NOTE — Progress Notes (Signed)
[place] for IV heparin  Indication: CAD, awaiting CABG consult  Allergies  Allergen Reactions  . Carbidopa-Levodopa Other (See Comments)    Developed tics while taking   . Prednisone Other (See Comments)    Turns red all over   . Venlafaxine Other (See Comments)    Developed tics while taking     Patient Measurements: Height: 5\' 3"  (160 cm) Weight: 129 lb 11.2 oz (58.8 kg) IBW/kg (Calculated) : 52.4 Heparin Dosing Weight: 58.5  Vital Signs: Temp: 98.8 F (37.1 C) (12/17 2059) Temp Source: Oral (12/17 2059) BP: 129/60 (12/17 2059) Pulse Rate: 72 (12/17 2059)  Labs: Recent Labs    09/14/19 1023 09/14/19 2134  HGB 12.2  --   HCT 37.6  --   PLT 264  --   HEPARINUNFRC  --  <0.10*  CREATININE 1.43*  --     Estimated Creatinine Clearance: 34.6 mL/min (A) (by C-G formula based on SCr of 1.43 mg/dL (H)).    Assessment: 58 yo female admitted for cath, found with 3V CAD.  Pharmacy asked to begin IV heparin while waiting for CABGt.  No known anticoagulants PTA.  For CABG 12/18 Heparin level < 0.10  Goal of Therapy:  Heparin level 0.3-0.7 units/ml Monitor platelets by anticoagulation protocol: Yes   Plan:  Increase heparin to 1000 units / hr Follow up after CABG 12/18  Thank you Anette Guarneri, PharmD   09/14/2019 10:09 PM

## 2019-09-14 NOTE — Telephone Encounter (Addendum)
Lab done [facility] 09/08/19 scanned to Epic-GFR 46, pt aware 09/13/19.

## 2019-09-15 ENCOUNTER — Inpatient Hospital Stay (HOSPITAL_COMMUNITY)
Admission: AD | Disposition: A | Discharge status: Home / Self Care | Payer: Self-pay | Source: Home / Self Care | Attending: Surgery

## 2019-09-15 ENCOUNTER — Inpatient Hospital Stay (HOSPITAL_COMMUNITY): Payer: Commercial Managed Care - PPO

## 2019-09-15 ENCOUNTER — Inpatient Hospital Stay (HOSPITAL_COMMUNITY): Payer: Commercial Managed Care - PPO | Admitting: Certified Registered Nurse Anesthetist

## 2019-09-15 HISTORY — PX: TEE WITHOUT CARDIOVERSION: SHX5443

## 2019-09-15 HISTORY — PX: CORONARY ARTERY BYPASS GRAFT: SHX141

## 2019-09-15 LAB — POCT I-STAT, CHEM 8
BUN: 13 mg/dL (ref 6–20)
BUN: 12 mg/dL (ref 6–20)
BUN: 11 mg/dL (ref 6–20)
BUN: 10 mg/dL (ref 6–20)
Calcium, Ion: 1.31 mmol/L (ref 1.15–1.40)
Calcium, Ion: 1.28 mmol/L (ref 1.15–1.40)
Calcium, Ion: 0.94 mmol/L — ABNORMAL LOW (ref 1.15–1.40)
Calcium, Ion: 1.06 mmol/L — ABNORMAL LOW (ref 1.15–1.40)
Chloride: 111 mmol/L (ref 98–111)
Chloride: 111 mmol/L (ref 98–111)
Chloride: 109 mmol/L (ref 98–111)
Chloride: 108 mmol/L (ref 98–111)
Creatinine, Ser: 1.1 mg/dL — ABNORMAL HIGH (ref 0.44–1.00)
Creatinine, Ser: 1 mg/dL (ref 0.44–1.00)
Creatinine, Ser: 0.9 mg/dL (ref 0.44–1.00)
Creatinine, Ser: 0.9 mg/dL (ref 0.44–1.00)
Glucose, Bld: 110 mg/dL — ABNORMAL HIGH (ref 70–99)
Glucose, Bld: 92 mg/dL (ref 70–99)
Glucose, Bld: 140 mg/dL — ABNORMAL HIGH (ref 70–99)
Glucose, Bld: 123 mg/dL — ABNORMAL HIGH (ref 70–99)
HCT: 30 % — ABNORMAL LOW (ref 36.0–46.0)
HCT: 26 % — ABNORMAL LOW (ref 36.0–46.0)
HCT: 24 % — ABNORMAL LOW (ref 36.0–46.0)
HCT: 24 % — ABNORMAL LOW (ref 36.0–46.0)
Hemoglobin: 10.2 g/dL — ABNORMAL LOW (ref 12.0–15.0)
Hemoglobin: 8.8 g/dL — ABNORMAL LOW (ref 12.0–15.0)
Hemoglobin: 8.2 g/dL — ABNORMAL LOW (ref 12.0–15.0)
Hemoglobin: 8.2 g/dL — ABNORMAL LOW (ref 12.0–15.0)
Potassium: 4 mmol/L (ref 3.5–5.1)
Potassium: 3.8 mmol/L (ref 3.5–5.1)
Potassium: 5 mmol/L (ref 3.5–5.1)
Potassium: 4.1 mmol/L (ref 3.5–5.1)
Sodium: 141 mmol/L (ref 135–145)
Sodium: 139 mmol/L (ref 135–145)
Sodium: 140 mmol/L (ref 135–145)
Sodium: 141 mmol/L (ref 135–145)
TCO2: 23 mmol/L (ref 22–32)
TCO2: 21 mmol/L — ABNORMAL LOW (ref 22–32)
TCO2: 23 mmol/L (ref 22–32)
TCO2: 23 mmol/L (ref 22–32)

## 2019-09-15 LAB — POCT I-STAT 7, (LYTES, BLD GAS, ICA,H+H)
Acid-base deficit: 4 mmol/L — ABNORMAL HIGH (ref 0.0–2.0)
Acid-base deficit: 6 mmol/L — ABNORMAL HIGH (ref 0.0–2.0)
Acid-base deficit: 5 mmol/L — ABNORMAL HIGH (ref 0.0–2.0)
Bicarbonate: 20.7 mmol/L (ref 20.0–28.0)
Bicarbonate: 20.7 mmol/L (ref 20.0–28.0)
Bicarbonate: 21 mmol/L (ref 20.0–28.0)
Calcium, Ion: 0.91 mmol/L — ABNORMAL LOW (ref 1.15–1.40)
Calcium, Ion: 1.17 mmol/L (ref 1.15–1.40)
Calcium, Ion: 1.19 mmol/L (ref 1.15–1.40)
HCT: 19 % — ABNORMAL LOW (ref 36.0–46.0)
HCT: 34 % — ABNORMAL LOW (ref 36.0–46.0)
HCT: 29 % — ABNORMAL LOW (ref 36.0–46.0)
Hemoglobin: 6.5 g/dL — CL (ref 12.0–15.0)
Hemoglobin: 11.6 g/dL — ABNORMAL LOW (ref 12.0–15.0)
Hemoglobin: 9.9 g/dL — ABNORMAL LOW (ref 12.0–15.0)
O2 Saturation: 100 %
O2 Saturation: 99 %
O2 Saturation: 99 %
Patient temperature: 35.3
Patient temperature: 36.7
Potassium: 4.2 mmol/L (ref 3.5–5.1)
Potassium: 3.6 mmol/L (ref 3.5–5.1)
Potassium: 4.4 mmol/L (ref 3.5–5.1)
Sodium: 141 mmol/L (ref 135–145)
Sodium: 143 mmol/L (ref 135–145)
Sodium: 143 mmol/L (ref 135–145)
TCO2: 22 mmol/L (ref 22–32)
TCO2: 22 mmol/L (ref 22–32)
TCO2: 22 mmol/L (ref 22–32)
pCO2 arterial: 32.7 mmHg (ref 32.0–48.0)
pCO2 arterial: 42.5 mmHg (ref 32.0–48.0)
pCO2 arterial: 41.6 mmHg (ref 32.0–48.0)
pH, Arterial: 7.41 (ref 7.350–7.450)
pH, Arterial: 7.287 — ABNORMAL LOW (ref 7.350–7.450)
pH, Arterial: 7.309 — ABNORMAL LOW (ref 7.350–7.450)
pO2, Arterial: 427 mmHg — ABNORMAL HIGH (ref 83.0–108.0)
pO2, Arterial: 127 mmHg — ABNORMAL HIGH (ref 83.0–108.0)
pO2, Arterial: 143 mmHg — ABNORMAL HIGH (ref 83.0–108.0)

## 2019-09-15 LAB — CBC
HCT: 33.4 % — ABNORMAL LOW (ref 36.0–46.0)
HCT: 35.8 % — ABNORMAL LOW (ref 36.0–46.0)
HCT: 30.8 % — ABNORMAL LOW (ref 36.0–46.0)
Hemoglobin: 10.9 g/dL — ABNORMAL LOW (ref 12.0–15.0)
Hemoglobin: 12.1 g/dL (ref 12.0–15.0)
Hemoglobin: 10.4 g/dL — ABNORMAL LOW (ref 12.0–15.0)
MCH: 29.6 pg (ref 26.0–34.0)
MCH: 29.8 pg (ref 26.0–34.0)
MCH: 29.6 pg (ref 26.0–34.0)
MCHC: 32.6 g/dL (ref 30.0–36.0)
MCHC: 33.8 g/dL (ref 30.0–36.0)
MCHC: 33.8 g/dL (ref 30.0–36.0)
MCV: 90.8 fL (ref 80.0–100.0)
MCV: 88.2 fL (ref 80.0–100.0)
MCV: 87.7 fL (ref 80.0–100.0)
Platelets: 242 10*3/uL (ref 150–400)
Platelets: 127 10*3/uL — ABNORMAL LOW (ref 150–400)
Platelets: 157 10*3/uL (ref 150–400)
RBC: 3.68 MIL/uL — ABNORMAL LOW (ref 3.87–5.11)
RBC: 4.06 MIL/uL (ref 3.87–5.11)
RBC: 3.51 MIL/uL — ABNORMAL LOW (ref 3.87–5.11)
RDW: 12.4 % (ref 11.5–15.5)
RDW: 13.2 % (ref 11.5–15.5)
RDW: 13.5 % (ref 11.5–15.5)
WBC: 6.6 10*3/uL (ref 4.0–10.5)
WBC: 10.2 10*3/uL (ref 4.0–10.5)
WBC: 13 10*3/uL — ABNORMAL HIGH (ref 4.0–10.5)
nRBC: 0 % (ref 0.0–0.2)
nRBC: 0 % (ref 0.0–0.2)
nRBC: 0 % (ref 0.0–0.2)

## 2019-09-15 LAB — URINALYSIS, ROUTINE W REFLEX MICROSCOPIC
Bacteria, UA: NONE SEEN
Bilirubin Urine: NEGATIVE
Glucose, UA: NEGATIVE mg/dL
Hgb urine dipstick: NEGATIVE
Ketones, ur: NEGATIVE mg/dL
Nitrite: NEGATIVE
Protein, ur: NEGATIVE mg/dL
Specific Gravity, Urine: 1.008 (ref 1.005–1.030)
pH: 7 (ref 5.0–8.0)

## 2019-09-15 LAB — GLUCOSE, CAPILLARY
Glucose-Capillary: 111 mg/dL — ABNORMAL HIGH (ref 70–99)
Glucose-Capillary: 125 mg/dL — ABNORMAL HIGH (ref 70–99)
Glucose-Capillary: 129 mg/dL — ABNORMAL HIGH (ref 70–99)
Glucose-Capillary: 136 mg/dL — ABNORMAL HIGH (ref 70–99)
Glucose-Capillary: 151 mg/dL — ABNORMAL HIGH (ref 70–99)
Glucose-Capillary: 152 mg/dL — ABNORMAL HIGH (ref 70–99)
Glucose-Capillary: 156 mg/dL — ABNORMAL HIGH (ref 70–99)
Glucose-Capillary: 162 mg/dL — ABNORMAL HIGH (ref 70–99)
Glucose-Capillary: 188 mg/dL — ABNORMAL HIGH (ref 70–99)
Glucose-Capillary: 96 mg/dL (ref 70–99)
Glucose-Capillary: 97 mg/dL (ref 70–99)

## 2019-09-15 LAB — SARS CORONAVIRUS 2 (TAT 6-24 HRS): SARS Coronavirus 2: NEGATIVE

## 2019-09-15 LAB — BASIC METABOLIC PANEL
Anion gap: 5 (ref 5–15)
Anion gap: 5 (ref 5–15)
BUN: 12 mg/dL (ref 6–20)
BUN: 12 mg/dL (ref 6–20)
CO2: 23 mmol/L (ref 22–32)
CO2: 23 mmol/L (ref 22–32)
Calcium: 8.6 mg/dL — ABNORMAL LOW (ref 8.9–10.3)
Calcium: 7.5 mg/dL — ABNORMAL LOW (ref 8.9–10.3)
Chloride: 112 mmol/L — ABNORMAL HIGH (ref 98–111)
Chloride: 115 mmol/L — ABNORMAL HIGH (ref 98–111)
Creatinine, Ser: 1.42 mg/dL — ABNORMAL HIGH (ref 0.44–1.00)
Creatinine, Ser: 1.15 mg/dL — ABNORMAL HIGH (ref 0.44–1.00)
GFR calc Af Amer: 46 mL/min — ABNORMAL LOW (ref >60)
GFR calc Af Amer: 60 mL/min — ABNORMAL LOW (ref >60)
GFR calc non Af Amer: 40 mL/min — ABNORMAL LOW (ref >60)
GFR calc non Af Amer: 52 mL/min — ABNORMAL LOW (ref >60)
Glucose, Bld: 111 mg/dL — ABNORMAL HIGH (ref 70–99)
Glucose, Bld: 164 mg/dL — ABNORMAL HIGH (ref 70–99)
Potassium: 4.1 mmol/L (ref 3.5–5.1)
Potassium: 4.1 mmol/L (ref 3.5–5.1)
Sodium: 140 mmol/L (ref 135–145)
Sodium: 143 mmol/L (ref 135–145)

## 2019-09-15 LAB — PROTIME-INR
INR: 1.3 — ABNORMAL HIGH (ref 0.8–1.2)
Prothrombin Time: 16.1 seconds — ABNORMAL HIGH (ref 11.4–15.2)

## 2019-09-15 LAB — APTT: aPTT: 27 seconds (ref 24–36)

## 2019-09-15 LAB — PLATELET COUNT: Platelets: 180 10*3/uL (ref 150–400)

## 2019-09-15 LAB — SURGICAL PCR SCREEN
MRSA, PCR: NEGATIVE
Staphylococcus aureus: POSITIVE — AB

## 2019-09-15 LAB — HEPARIN LEVEL (UNFRACTIONATED): Heparin Unfractionated: 0.53 IU/mL (ref 0.30–0.70)

## 2019-09-15 LAB — MAGNESIUM: Magnesium: 2.8 mg/dL — ABNORMAL HIGH (ref 1.7–2.4)

## 2019-09-15 LAB — ECHO INTRAOPERATIVE TEE
Height: 63 in
Weight: 2076.8 oz

## 2019-09-15 LAB — HEMOGLOBIN AND HEMATOCRIT, BLOOD
HCT: 27.6 % — ABNORMAL LOW (ref 36.0–46.0)
Hemoglobin: 9.1 g/dL — ABNORMAL LOW (ref 12.0–15.0)

## 2019-09-15 LAB — PREPARE RBC (CROSSMATCH)

## 2019-09-15 SURGERY — CORONARY ARTERY BYPASS GRAFTING (CABG)
Anesthesia: General | Site: Chest

## 2019-09-15 MED ORDER — SODIUM CHLORIDE 0.9 % IV SOLN: 250.0000 mL | INTRAVENOUS | Status: DC

## 2019-09-15 MED ORDER — ALBUMIN HUMAN 5 % IV SOLN: INTRAVENOUS | Status: DC | PRN

## 2019-09-15 MED ORDER — BISACODYL 5 MG PO TBEC
10.0000 mg | DELAYED_RELEASE_TABLET | Freq: Every day | ORAL | Status: DC
  Administered 2019-09-16 – 2019-09-17 (×2): 10 mg via ORAL
  Filled 2019-09-15 (×3): qty 2

## 2019-09-15 MED ORDER — DOCUSATE SODIUM 100 MG PO CAPS
200.0000 mg | ORAL_CAPSULE | Freq: Every day | ORAL | Status: DC
  Administered 2019-09-16 – 2019-09-17 (×2): 200 mg via ORAL
  Filled 2019-09-15 (×4): qty 2

## 2019-09-15 MED ORDER — PROPOFOL 10 MG/ML IV BOLUS
INTRAVENOUS | Status: DC | PRN
  Administered 2019-09-15: 50 mg via INTRAVENOUS
  Administered 2019-09-15: 20 mg via INTRAVENOUS

## 2019-09-15 MED ORDER — VANCOMYCIN HCL 1000 MG IV SOLR
INTRAVENOUS | Status: DC | PRN
  Administered 2019-09-15: 1250 mg via INTRAVENOUS

## 2019-09-15 MED ORDER — NITROGLYCERIN IN D5W 200-5 MCG/ML-% IV SOLN: 0.0000 ug/min | INTRAVENOUS | Status: DC

## 2019-09-15 MED ORDER — ACETAMINOPHEN 160 MG/5ML PO SOLN: 650.0000 mg | Freq: Once | ORAL | Status: DC

## 2019-09-15 MED ORDER — MAGNESIUM SULFATE 4 GM/100ML IV SOLN
4.0000 g | Freq: Once | INTRAVENOUS | Status: AC
  Administered 2019-09-15: 4 g via INTRAVENOUS
  Filled 2019-09-15: qty 100

## 2019-09-15 MED ORDER — ACETAMINOPHEN 500 MG PO TABS
1000.0000 mg | ORAL_TABLET | Freq: Four times a day (QID) | ORAL | Status: DC
  Administered 2019-09-15 – 2019-09-19 (×13): 1000 mg via ORAL
  Filled 2019-09-15 (×13): qty 2

## 2019-09-15 MED ORDER — THROMBIN (RECOMBINANT) 20000 UNITS EX SOLR
CUTANEOUS | Status: AC
  Filled 2019-09-15: qty 20000

## 2019-09-15 MED ORDER — MIDAZOLAM HCL 2 MG/2ML IJ SOLN: 2.0000 mg | INTRAMUSCULAR | Status: DC | PRN

## 2019-09-15 MED ORDER — PHENYLEPHRINE HCL-NACL 20-0.9 MG/250ML-% IV SOLN: 0.0000 ug/min | INTRAVENOUS | Status: DC

## 2019-09-15 MED ORDER — LACTATED RINGERS IV SOLN: INTRAVENOUS | Status: DC | PRN

## 2019-09-15 MED ORDER — PHENYLEPHRINE HCL-NACL 20-0.9 MG/250ML-% IV SOLN
INTRAVENOUS | Status: DC | PRN
  Administered 2019-09-15: 25 ug/min via INTRAVENOUS

## 2019-09-15 MED ORDER — 0.9 % SODIUM CHLORIDE (POUR BTL) OPTIME
TOPICAL | Status: DC | PRN
  Administered 2019-09-15: 5000 mL

## 2019-09-15 MED ORDER — ROCURONIUM BROMIDE 10 MG/ML (PF) SYRINGE
PREFILLED_SYRINGE | INTRAVENOUS | Status: AC
  Filled 2019-09-15: qty 20

## 2019-09-15 MED ORDER — CHLORHEXIDINE GLUCONATE CLOTH 2 % EX PADS
6.0000 | MEDICATED_PAD | Freq: Every day | CUTANEOUS | Status: DC
  Administered 2019-09-15 – 2019-09-19 (×5): 6 via TOPICAL

## 2019-09-15 MED ORDER — DEXTROSE 50 % IV SOLN
0.0000 mL | INTRAVENOUS | Status: DC | PRN
Start: 2019-09-15 — End: 2019-09-16

## 2019-09-15 MED ORDER — MIDAZOLAM HCL (PF) 10 MG/2ML IJ SOLN
INTRAMUSCULAR | Status: AC
  Filled 2019-09-15: qty 2

## 2019-09-15 MED ORDER — ARTIFICIAL TEARS OPHTHALMIC OINT
TOPICAL_OINTMENT | OPHTHALMIC | Status: DC | PRN
  Administered 2019-09-15: 1 via OPHTHALMIC

## 2019-09-15 MED ORDER — SODIUM CHLORIDE 0.9 % IV SOLN: INTRAVENOUS | Status: DC

## 2019-09-15 MED ORDER — ALBUMIN HUMAN 5 % IV SOLN
250.0000 mL | INTRAVENOUS | Status: AC | PRN
  Administered 2019-09-15 – 2019-09-16 (×4): 12.5 g via INTRAVENOUS
  Filled 2019-09-15 (×2): qty 250

## 2019-09-15 MED ORDER — FAMOTIDINE IN NACL 20-0.9 MG/50ML-% IV SOLN
20.0000 mg | Freq: Two times a day (BID) | INTRAVENOUS | Status: AC
  Administered 2019-09-15 (×2): 20 mg via INTRAVENOUS
  Filled 2019-09-15: qty 50

## 2019-09-15 MED ORDER — HEMOSTATIC AGENTS (NO CHARGE) OPTIME
TOPICAL | Status: DC | PRN
  Administered 2019-09-15: 1 via TOPICAL

## 2019-09-15 MED ORDER — MIDAZOLAM HCL 5 MG/5ML IJ SOLN
INTRAMUSCULAR | Status: DC | PRN
  Administered 2019-09-15 (×2): 1 mg via INTRAVENOUS

## 2019-09-15 MED ORDER — SODIUM CHLORIDE 0.45 % IV SOLN: INTRAVENOUS | Status: DC | PRN

## 2019-09-15 MED ORDER — PROPOFOL 10 MG/ML IV BOLUS
INTRAVENOUS | Status: AC
  Filled 2019-09-15: qty 20

## 2019-09-15 MED ORDER — INSULIN REGULAR(HUMAN) IN NACL 100-0.9 UT/100ML-% IV SOLN
INTRAVENOUS | Status: DC | PRN
  Administered 2019-09-15: 1 [IU]/h via INTRAVENOUS

## 2019-09-15 MED ORDER — ACETAMINOPHEN 650 MG RE SUPP: 650.0000 mg | Freq: Once | RECTAL | Status: DC

## 2019-09-15 MED ORDER — HYDRALAZINE HCL 20 MG/ML IJ SOLN
10.0000 mg | INTRAMUSCULAR | Status: DC | PRN
  Administered 2019-09-15 – 2019-09-16 (×2): 10 mg via INTRAVENOUS
  Filled 2019-09-15: qty 1

## 2019-09-15 MED ORDER — MUPIROCIN 2 % EX OINT
1.0000 "application " | TOPICAL_OINTMENT | Freq: Two times a day (BID) | CUTANEOUS | Status: DC
  Administered 2019-09-15 – 2019-09-18 (×8): 1 via NASAL
  Filled 2019-09-15 (×4): qty 22

## 2019-09-15 MED ORDER — SODIUM CHLORIDE 0.9% FLUSH: 3.0000 mL | INTRAVENOUS | Status: DC | PRN

## 2019-09-15 MED ORDER — ROCURONIUM BROMIDE 10 MG/ML (PF) SYRINGE
PREFILLED_SYRINGE | INTRAVENOUS | Status: DC | PRN
  Administered 2019-09-15 (×4): 50 mg via INTRAVENOUS
  Administered 2019-09-15: 20 mg via INTRAVENOUS

## 2019-09-15 MED ORDER — ASPIRIN EC 325 MG PO TBEC
325.0000 mg | DELAYED_RELEASE_TABLET | Freq: Every day | ORAL | Status: DC
  Administered 2019-09-16 – 2019-09-19 (×4): 325 mg via ORAL
  Filled 2019-09-15 (×4): qty 1

## 2019-09-15 MED ORDER — ARTIFICIAL TEARS OPHTHALMIC OINT
TOPICAL_OINTMENT | OPHTHALMIC | Status: AC
  Filled 2019-09-15: qty 7

## 2019-09-15 MED ORDER — SODIUM BICARBONATE 8.4 % IV SOLN
INTRAVENOUS | Status: AC
  Administered 2019-09-15: 50 meq
  Filled 2019-09-15: qty 100

## 2019-09-15 MED ORDER — PROTAMINE SULFATE 10 MG/ML IV SOLN
INTRAVENOUS | Status: DC | PRN
  Administered 2019-09-15: 10 mg via INTRAVENOUS
  Administered 2019-09-15 (×2): 20 mg via INTRAVENOUS
  Administered 2019-09-15: 50 mg via INTRAVENOUS
  Administered 2019-09-15: 20 mg via INTRAVENOUS

## 2019-09-15 MED ORDER — DEXMEDETOMIDINE HCL IN NACL 400 MCG/100ML IV SOLN
INTRAVENOUS | Status: DC | PRN
  Administered 2019-09-15: .7 ug/kg/h via INTRAVENOUS

## 2019-09-15 MED ORDER — SODIUM BICARBONATE 8.4 % IV SOLN: 50.0000 meq | Freq: Once | INTRAVENOUS | Status: DC

## 2019-09-15 MED ORDER — METOPROLOL TARTRATE 12.5 MG HALF TABLET
12.5000 mg | ORAL_TABLET | Freq: Two times a day (BID) | ORAL | Status: DC
  Administered 2019-09-15 – 2019-09-17 (×4): 12.5 mg via ORAL
  Filled 2019-09-15 (×4): qty 1

## 2019-09-15 MED ORDER — SODIUM CHLORIDE 0.9 % IV SOLN
1.5000 g | Freq: Two times a day (BID) | INTRAVENOUS | Status: AC
  Administered 2019-09-15 – 2019-09-17 (×4): 1.5 g via INTRAVENOUS
  Filled 2019-09-15 (×4): qty 1.5

## 2019-09-15 MED ORDER — BISACODYL 10 MG RE SUPP: 10.0000 mg | Freq: Every day | RECTAL | Status: DC

## 2019-09-15 MED ORDER — METOPROLOL TARTRATE 25 MG/10 ML ORAL SUSPENSION: 12.5000 mg | Freq: Two times a day (BID) | ORAL | Status: DC

## 2019-09-15 MED ORDER — CHLORHEXIDINE GLUCONATE 0.12 % MT SOLN
15.0000 mL | OROMUCOSAL | Status: AC
  Administered 2019-09-15: 15 mL via OROMUCOSAL

## 2019-09-15 MED ORDER — MORPHINE SULFATE (PF) 2 MG/ML IV SOLN
1.0000 mg | INTRAVENOUS | Status: DC | PRN
  Administered 2019-09-15 (×2): 2 mg via INTRAVENOUS
  Filled 2019-09-15 (×2): qty 1

## 2019-09-15 MED ORDER — SODIUM CHLORIDE (PF) 0.9 % IJ SOLN
INTRAMUSCULAR | Status: AC
  Filled 2019-09-15: qty 10

## 2019-09-15 MED ORDER — VANCOMYCIN HCL IN DEXTROSE 1-5 GM/200ML-% IV SOLN
1000.0000 mg | Freq: Once | INTRAVENOUS | Status: AC
  Administered 2019-09-15: 1000 mg via INTRAVENOUS
  Filled 2019-09-15: qty 200

## 2019-09-15 MED ORDER — THROMBIN 20000 UNITS EX SOLR
CUTANEOUS | Status: DC | PRN
  Administered 2019-09-15: 20000 [IU] via TOPICAL

## 2019-09-15 MED ORDER — ACETAMINOPHEN 160 MG/5ML PO SOLN: 1000.0000 mg | Freq: Four times a day (QID) | ORAL | Status: DC

## 2019-09-15 MED ORDER — ROCURONIUM BROMIDE 10 MG/ML (PF) SYRINGE
PREFILLED_SYRINGE | INTRAVENOUS | Status: AC
  Filled 2019-09-15: qty 10

## 2019-09-15 MED ORDER — HYDRALAZINE HCL 20 MG/ML IJ SOLN
INTRAMUSCULAR | Status: AC
  Filled 2019-09-15: qty 1

## 2019-09-15 MED ORDER — NITROGLYCERIN IN D5W 200-5 MCG/ML-% IV SOLN
INTRAVENOUS | Status: DC | PRN
  Administered 2019-09-15: 16.67 ug/kg/min via INTRAVENOUS

## 2019-09-15 MED ORDER — ASPIRIN 81 MG PO CHEW: 324.0000 mg | CHEWABLE_TABLET | Freq: Every day | ORAL | Status: DC

## 2019-09-15 MED ORDER — ARTIFICIAL TEARS OPHTHALMIC OINT
TOPICAL_OINTMENT | OPHTHALMIC | Status: AC
  Filled 2019-09-15: qty 3.5

## 2019-09-15 MED ORDER — TRANEXAMIC ACID 1000 MG/10ML IV SOLN
INTRAVENOUS | Status: DC | PRN
  Administered 2019-09-15: 1.5 mg/kg/h via INTRAVENOUS

## 2019-09-15 MED ORDER — ONDANSETRON HCL 4 MG/2ML IJ SOLN
4.0000 mg | Freq: Four times a day (QID) | INTRAMUSCULAR | Status: DC | PRN
  Administered 2019-09-16: 12:00:00 4 mg via INTRAVENOUS
  Filled 2019-09-15: qty 2

## 2019-09-15 MED ORDER — LACTATED RINGERS IV SOLN: INTRAVENOUS | Status: DC

## 2019-09-15 MED ORDER — PANTOPRAZOLE SODIUM 40 MG PO TBEC
40.0000 mg | DELAYED_RELEASE_TABLET | Freq: Every day | ORAL | Status: DC
  Administered 2019-09-17 – 2019-09-19 (×3): 40 mg via ORAL
  Filled 2019-09-15 (×3): qty 1

## 2019-09-15 MED ORDER — FAMOTIDINE IN NACL 20-0.9 MG/50ML-% IV SOLN
INTRAVENOUS | Status: AC
  Filled 2019-09-15: qty 50

## 2019-09-15 MED ORDER — THROMBIN 20000 UNITS EX SOLR
OROMUCOSAL | Status: DC | PRN
  Administered 2019-09-15 (×3): 4 mL via TOPICAL

## 2019-09-15 MED ORDER — HEPARIN SODIUM (PORCINE) 1000 UNIT/ML IJ SOLN
INTRAMUSCULAR | Status: DC | PRN
  Administered 2019-09-15: 20000 [IU] via INTRAVENOUS

## 2019-09-15 MED ORDER — DEXMEDETOMIDINE HCL IN NACL 400 MCG/100ML IV SOLN: 0.0000 ug/kg/h | INTRAVENOUS | Status: DC

## 2019-09-15 MED ORDER — INSULIN REGULAR(HUMAN) IN NACL 100-0.9 UT/100ML-% IV SOLN: INTRAVENOUS | Status: DC

## 2019-09-15 MED ORDER — SODIUM BICARBONATE 8.4 % IV SOLN
50.0000 meq | Freq: Once | INTRAVENOUS | Status: AC
  Administered 2019-09-15: 50 meq via INTRAVENOUS
  Filled 2019-09-15: qty 50

## 2019-09-15 MED ORDER — PLASMA-LYTE 148 IV SOLN
INTRAVENOUS | Status: DC | PRN
  Administered 2019-09-15: 10:00:00 500 mL via INTRAVASCULAR

## 2019-09-15 MED ORDER — SODIUM CHLORIDE 0.9% FLUSH
3.0000 mL | Freq: Two times a day (BID) | INTRAVENOUS | Status: DC
  Administered 2019-09-16 – 2019-09-19 (×5): 3 mL via INTRAVENOUS

## 2019-09-15 MED ORDER — FENTANYL CITRATE (PF) 250 MCG/5ML IJ SOLN
INTRAMUSCULAR | Status: DC | PRN
  Administered 2019-09-15: 100 ug via INTRAVENOUS
  Administered 2019-09-15 (×4): 50 ug via INTRAVENOUS
  Administered 2019-09-15: 100 ug via INTRAVENOUS
  Administered 2019-09-15 (×2): 50 ug via INTRAVENOUS
  Administered 2019-09-15: 150 ug via INTRAVENOUS
  Administered 2019-09-15: 50 ug via INTRAVENOUS
  Administered 2019-09-15: 150 ug via INTRAVENOUS
  Administered 2019-09-15 (×3): 50 ug via INTRAVENOUS
  Administered 2019-09-15: 250 ug via INTRAVENOUS

## 2019-09-15 MED ORDER — OXYCODONE HCL 5 MG PO TABS
5.0000 mg | ORAL_TABLET | ORAL | Status: DC | PRN
  Administered 2019-09-15 – 2019-09-16 (×2): 5 mg via ORAL
  Administered 2019-09-16 (×2): 10 mg via ORAL
  Administered 2019-09-17: 5 mg via ORAL
  Administered 2019-09-18: 10 mg via ORAL
  Filled 2019-09-15: qty 1
  Filled 2019-09-15 (×2): qty 2
  Filled 2019-09-15 (×2): qty 1
  Filled 2019-09-15: qty 2

## 2019-09-15 MED ORDER — POTASSIUM CHLORIDE 10 MEQ/50ML IV SOLN
10.0000 meq | INTRAVENOUS | Status: AC
  Administered 2019-09-15 (×3): 10 meq via INTRAVENOUS

## 2019-09-15 MED ORDER — LACTATED RINGERS IV SOLN: 500.0000 mL | Freq: Once | INTRAVENOUS | Status: DC | PRN

## 2019-09-15 MED ORDER — ACETAMINOPHEN 650 MG RE SUPP
RECTAL | Status: AC
  Administered 2019-09-15: 650 mg
  Filled 2019-09-15: qty 1

## 2019-09-15 MED ORDER — PHENYLEPHRINE 40 MCG/ML (10ML) SYRINGE FOR IV PUSH (FOR BLOOD PRESSURE SUPPORT)
PREFILLED_SYRINGE | INTRAVENOUS | Status: DC | PRN
  Administered 2019-09-15: 80 ug via INTRAVENOUS

## 2019-09-15 MED ORDER — PHENYLEPHRINE 40 MCG/ML (10ML) SYRINGE FOR IV PUSH (FOR BLOOD PRESSURE SUPPORT)
PREFILLED_SYRINGE | INTRAVENOUS | Status: AC
  Filled 2019-09-15: qty 10

## 2019-09-15 MED ORDER — METOPROLOL TARTRATE 5 MG/5ML IV SOLN
2.5000 mg | INTRAVENOUS | Status: DC | PRN
  Administered 2019-09-15 – 2019-09-16 (×2): 5 mg via INTRAVENOUS
  Filled 2019-09-15 (×2): qty 5

## 2019-09-15 MED ORDER — ALBUMIN HUMAN 5 % IV SOLN
INTRAVENOUS | Status: AC
  Filled 2019-09-15: qty 500

## 2019-09-15 MED ORDER — TRAMADOL HCL 50 MG PO TABS
50.0000 mg | ORAL_TABLET | ORAL | Status: DC | PRN
  Administered 2019-09-16: 50 mg via ORAL
  Administered 2019-09-17 – 2019-09-18 (×3): 100 mg via ORAL
  Filled 2019-09-15 (×3): qty 2
  Filled 2019-09-15: qty 1

## 2019-09-15 MED ORDER — FENTANYL CITRATE (PF) 250 MCG/5ML IJ SOLN
INTRAMUSCULAR | Status: AC
  Filled 2019-09-15: qty 25

## 2019-09-15 SURGICAL SUPPLY — 110 items
APPLICATOR COTTON TIP 6 STRL (MISCELLANEOUS) IMPLANT
APPLICATOR COTTON TIP 6IN STRL (MISCELLANEOUS) ×3
BAG DECANTER FOR FLEXI CONT (MISCELLANEOUS) ×3 IMPLANT
BASKET HEART (ORDER IN 25'S) (MISCELLANEOUS) ×1
BASKET HEART (ORDER IN 25S) (MISCELLANEOUS) ×2 IMPLANT
BLADE CLIPPER SURG (BLADE) ×2 IMPLANT
BLADE STERNUM SYSTEM 6 (BLADE) ×3 IMPLANT
BLADE SURG 11 STRL SS (BLADE) ×1 IMPLANT
BNDG ELASTIC 4X5.8 VLCR STR LF (GAUZE/BANDAGES/DRESSINGS) ×4 IMPLANT
BNDG ELASTIC 6X5.8 VLCR STR LF (GAUZE/BANDAGES/DRESSINGS) ×4 IMPLANT
BNDG GAUZE ELAST 4 BULKY (GAUZE/BANDAGES/DRESSINGS) ×4 IMPLANT
CANISTER SUCT 3000ML PPV (MISCELLANEOUS) ×3 IMPLANT
CATH ROBINSON RED A/P 18FR (CATHETERS) ×6 IMPLANT
CATH THORACIC 28FR (CATHETERS) ×3 IMPLANT
CATH THORACIC 36FR (CATHETERS) ×3 IMPLANT
CATH THORACIC 36FR RT ANG (CATHETERS) ×3 IMPLANT
CLIP VESOCCLUDE MED 24/CT (CLIP) IMPLANT
CLIP VESOCCLUDE SM WIDE 24/CT (CLIP) IMPLANT
COVER WAND RF STERILE (DRAPES) ×2 IMPLANT
DERMABOND ADVANCED (GAUZE/BANDAGES/DRESSINGS) ×1
DERMABOND ADVANCED .7 DNX12 (GAUZE/BANDAGES/DRESSINGS) IMPLANT
DRAPE CARDIOVASCULAR INCISE (DRAPES) ×1
DRAPE SLUSH/WARMER DISC (DRAPES) ×3 IMPLANT
DRAPE SRG 135X102X78XABS (DRAPES) ×2 IMPLANT
DRSG COVADERM 4X14 (GAUZE/BANDAGES/DRESSINGS) ×3 IMPLANT
ELECT CAUTERY BLADE 6.4 (BLADE) ×3 IMPLANT
ELECT REM PT RETURN 9FT ADLT (ELECTROSURGICAL) ×6
ELECTRODE REM PT RTRN 9FT ADLT (ELECTROSURGICAL) ×4 IMPLANT
FELT TEFLON 1X6 (MISCELLANEOUS) ×5 IMPLANT
GAUZE SPONGE 4X4 12PLY STRL (GAUZE/BANDAGES/DRESSINGS) ×7 IMPLANT
GLOVE BIO SURGEON STRL SZ 6 (GLOVE) IMPLANT
GLOVE BIO SURGEON STRL SZ 6.5 (GLOVE) ×6 IMPLANT
GLOVE BIO SURGEON STRL SZ7 (GLOVE) IMPLANT
GLOVE BIO SURGEON STRL SZ7.5 (GLOVE) IMPLANT
GLOVE BIOGEL PI IND STRL 6 (GLOVE) IMPLANT
GLOVE BIOGEL PI IND STRL 6.5 (GLOVE) IMPLANT
GLOVE BIOGEL PI IND STRL 7.0 (GLOVE) IMPLANT
GLOVE BIOGEL PI INDICATOR 6 (GLOVE)
GLOVE BIOGEL PI INDICATOR 6.5 (GLOVE) ×1
GLOVE BIOGEL PI INDICATOR 7.0 (GLOVE) ×2
GLOVE EUDERMIC 7 POWDERFREE (GLOVE) ×6 IMPLANT
GLOVE ORTHO TXT STRL SZ7.5 (GLOVE) IMPLANT
GLOVE SURG SS PI 7.5 STRL IVOR (GLOVE) ×1 IMPLANT
GOWN STRL REUS W/ TWL LRG LVL3 (GOWN DISPOSABLE) ×8 IMPLANT
GOWN STRL REUS W/ TWL XL LVL3 (GOWN DISPOSABLE) ×2 IMPLANT
GOWN STRL REUS W/TWL LRG LVL3 (GOWN DISPOSABLE) ×10
GOWN STRL REUS W/TWL XL LVL3 (GOWN DISPOSABLE) ×2
HEMOSTAT POWDER SURGIFOAM 1G (HEMOSTASIS) ×9 IMPLANT
HEMOSTAT SURGICEL 2X14 (HEMOSTASIS) ×3 IMPLANT
INSERT FOGARTY 61MM (MISCELLANEOUS) IMPLANT
INSERT FOGARTY XLG (MISCELLANEOUS) IMPLANT
KIT BASIN OR (CUSTOM PROCEDURE TRAY) ×3 IMPLANT
KIT CATH CPB BARTLE (MISCELLANEOUS) ×3 IMPLANT
KIT SUCTION CATH 14FR (SUCTIONS) ×3 IMPLANT
KIT TURNOVER KIT B (KITS) ×3 IMPLANT
KIT VASOVIEW HEMOPRO 2 VH 4000 (KITS) ×3 IMPLANT
NS IRRIG 1000ML POUR BTL (IV SOLUTION) ×15 IMPLANT
PACK E OPEN HEART (SUTURE) ×3 IMPLANT
PACK OPEN HEART (CUSTOM PROCEDURE TRAY) ×3 IMPLANT
PAD ARMBOARD 7.5X6 YLW CONV (MISCELLANEOUS) ×4 IMPLANT
PAD ELECT DEFIB RADIOL ZOLL (MISCELLANEOUS) ×3 IMPLANT
PENCIL BUTTON HOLSTER BLD 10FT (ELECTRODE) ×3 IMPLANT
POSITIONER HEAD DONUT 9IN (MISCELLANEOUS) ×3 IMPLANT
PUNCH AORTIC ROTATE 4.0MM (MISCELLANEOUS) ×1 IMPLANT
PUNCH AORTIC ROTATE 4.5MM 8IN (MISCELLANEOUS) ×3 IMPLANT
PUNCH AORTIC ROTATE 5MM 8IN (MISCELLANEOUS) IMPLANT
SEALANT SURG COSEAL 4ML (VASCULAR PRODUCTS) ×1 IMPLANT
SET CARDIOPLEGIA MPS 5001102 (MISCELLANEOUS) ×1 IMPLANT
SOL ANTI FOG 6CC (MISCELLANEOUS) IMPLANT
SOLUTION ANTI FOG 6CC (MISCELLANEOUS) ×1
SPONGE INTESTINAL PEANUT (DISPOSABLE) IMPLANT
SPONGE LAP 18X18 RF (DISPOSABLE) IMPLANT
SPONGE LAP 4X18 RFD (DISPOSABLE) ×2 IMPLANT
SUT BONE WAX W31G (SUTURE) ×3 IMPLANT
SUT MNCRL AB 4-0 PS2 18 (SUTURE) ×1 IMPLANT
SUT PROLENE 3 0 SH DA (SUTURE) IMPLANT
SUT PROLENE 3 0 SH1 36 (SUTURE) ×3 IMPLANT
SUT PROLENE 4 0 RB 1 (SUTURE)
SUT PROLENE 4 0 SH DA (SUTURE) IMPLANT
SUT PROLENE 4-0 RB1 .5 CRCL 36 (SUTURE) IMPLANT
SUT PROLENE 5 0 C 1 36 (SUTURE) IMPLANT
SUT PROLENE 6 0 C 1 30 (SUTURE) IMPLANT
SUT PROLENE 7 0 BV 1 (SUTURE) IMPLANT
SUT PROLENE 7 0 BV1 MDA (SUTURE) ×3 IMPLANT
SUT PROLENE 8 0 BV175 6 (SUTURE) IMPLANT
SUT SILK  1 MH (SUTURE)
SUT SILK 1 MH (SUTURE) IMPLANT
SUT SILK 2 0 SH (SUTURE) IMPLANT
SUT STEEL 6MS V (SUTURE) ×2 IMPLANT
SUT STEEL STERNAL CCS#1 18IN (SUTURE) IMPLANT
SUT STEEL SZ 6 DBL 3X14 BALL (SUTURE) IMPLANT
SUT VIC AB 1 CTX 36 (SUTURE) ×2
SUT VIC AB 1 CTX36XBRD ANBCTR (SUTURE) ×4 IMPLANT
SUT VIC AB 2-0 CT1 27 (SUTURE) ×1
SUT VIC AB 2-0 CT1 TAPERPNT 27 (SUTURE) IMPLANT
SUT VIC AB 2-0 CTX 27 (SUTURE) IMPLANT
SUT VIC AB 3-0 SH 27 (SUTURE)
SUT VIC AB 3-0 SH 27X BRD (SUTURE) IMPLANT
SUT VIC AB 3-0 X1 27 (SUTURE) IMPLANT
SUT VICRYL 4-0 PS2 18IN ABS (SUTURE) IMPLANT
SYR BULB IRRIGATION 50ML (SYRINGE) ×2 IMPLANT
SYSTEM SAHARA CHEST DRAIN ATS (WOUND CARE) ×3 IMPLANT
TAPE CLOTH SOFT 2X10 (GAUZE/BANDAGES/DRESSINGS) ×1 IMPLANT
TAPE CLOTH SURG 4X10 WHT LF (GAUZE/BANDAGES/DRESSINGS) ×1 IMPLANT
TOWEL GREEN STERILE (TOWEL DISPOSABLE) ×3 IMPLANT
TOWEL GREEN STERILE FF (TOWEL DISPOSABLE) ×3 IMPLANT
TRAY FOLEY SLVR 16FR TEMP STAT (SET/KITS/TRAYS/PACK) ×3 IMPLANT
TUBING LAP HI FLOW INSUFFLATIO (TUBING) ×3 IMPLANT
UNDERPAD 30X30 (UNDERPADS AND DIAPERS) ×3 IMPLANT
WATER STERILE IRR 1000ML POUR (IV SOLUTION) ×6 IMPLANT

## 2019-09-15 NOTE — Op Note (Signed)
CARDIOVASCULAR SURGERY OPERATIVE NOTE  09/15/2019  Surgeon:  Gaye Pollack, MD  First Assistant: Ellwood Handler,  PA-C   Preoperative Diagnosis:  Severe multi-vessel coronary artery disease   Postoperative Diagnosis:  Same   Procedure:  1. Median Sternotomy 2. Extracorporeal circulation 3.   Coronary artery bypass grafting x 3   Left internal mammary graft to the LAD  SVG to OM  SVG to PDA  4.   Endoscopic vein harvest from the right and left legs   Anesthesia:  General Endotracheal   Clinical History/Surgical Indication:  She has severe, diffuse 3-vessel coronary artery diseased. Her LAD is a good graftable vessel but LCX is subtotally occluded and diffusely diseased distally in all of the small branches. Her RCA has 75% mid and distal lesions before the PDA. The distal vessel also has significant disease but the two branches beyond that are small and probably not graftable. I agree that CABG is the best option but I think the long term results for the LCX are questionable. I discussed the operative procedure with the patient  including alternatives, benefits and risks; including but not limited to bleeding, blood transfusion, infection, stroke, myocardial infarction, graft failure, heart block requiring a permanent pacemaker, organ dysfunction, and death.  [name] understands and agrees to proceed.  Preparation:  The patient was seen in the preoperative holding area and the correct patient, correct operation were confirmed with the patient after reviewing the medical record and catheterization. The consent was signed by me. Preoperative antibiotics were given. A pulmonary arterial line and radial arterial line were placed by the anesthesia team. The patient was taken back to the operating room and positioned supine on the operating room table. After being placed under general  endotracheal anesthesia by the anesthesia team a foley catheter was placed. The neck, chest, abdomen, and both legs were prepped with betadine soap and solution and draped in the usual sterile manner. A surgical time-out was taken and the correct patient and operative procedure were confirmed with the nursing and anesthesia staff.   Cardiopulmonary Bypass:  A median sternotomy was performed. The pericardium was opened in the midline. Right ventricular function appeared normal. The ascending aorta was of normal size and had no palpable plaque. There were no contraindications to aortic cannulation or cross-clamping. The patient was fully systemically heparinized and the ACT was maintained > 400 sec. The proximal aortic arch was cannulated with a 20 F aortic cannula for arterial inflow. Venous cannulation was performed via the right atrial appendage using a two-staged venous cannula. An antegrade cardioplegia/vent cannula was inserted into the mid-ascending aorta. Aortic occlusion was performed with a single cross-clamp. Systemic cooling to 32 degrees Centigrade and topical cooling of the heart with iced saline were used. Hyperkalemic antegrade cold blood cardioplegia was used to induce diastolic arrest and was then given at about 20 minute intervals throughout the period of arrest to maintain myocardial temperature at or below 10 degrees centigrade. A temperature probe was inserted into the interventricular septum and an insulating pad was placed in the pericardium.   Left internal mammary harvest:  The left side of the sternum was retracted using the Rultract retractor. The left internal mammary artery was harvested as a pedicle graft. All side branches were clipped. It was a small to medium-sized vessel of good quality with excellent blood flow. It was ligated distally and divided. It was sprayed with topical papaverine solution to prevent vasospasm.   Endoscopic vein harvest:  The right  greater  saphenous vein was harvested endoscopically through a 2 cm incision medial to the right knee. It was harvested from the upper thigh to below the knee. It was a small-sized vein of good quality. The distal vein from the lower leg was too small. Therefore a second segment of GSV was harvested from the left thigh using endoscopic technique. It was a small vein but of good quality and suitable for bypass conduit. The side branches were all ligated with 4-0 silk ties.    Coronary arteries:  The coronary arteries were examined.   LAD:  Medium caliber vessel with no distal disease.  LCX:  The OM is severely and diffusely diseased with plaque extending out into the distal sub-branches as far as could be seen. There was one short segment in the mid portion of the main body of this OM that was soft enough to graft but it was still proximal to all of the severe disease in the sub-branches.  RCA:  The PDA was small and diffusely diseased but graftable. The PL branches were tiny and not graftable.   Grafts:  1. LIMA to the LAD: 2.0 mm. It was sewn end to side using 8-0 prolene continuous suture. 2. SVG to OM:  1.6 mm. It was sewn end to side using 7-0 prolene continuous suture. 3. SVG to PDA:  1.5 mm. It was sewn end to side using 7-0 prolene continuous suture.   The proximal vein graft anastomoses were performed to the mid-ascending aorta using continuous 6-0 prolene suture. Graft markers were placed around the proximal anastomoses.   Completion:  The patient was rewarmed to 37 degrees Centigrade. The clamp was removed from the LIMA pedicle and there was rapid warming of the septum and return of ventricular fibrillation. The crossclamp was removed with a time of 72 minutes. There was spontaneous return of sinus rhythm. The distal and proximal anastomoses were checked for hemostasis. The position of the grafts was satisfactory. Two temporary epicardial pacing wires were placed on the right atrium and  two on the right ventricle. The patient was weaned from CPB without difficulty on no inotropes. CPB time was 87 minutes. Cardiac output was 5 LPM. TEE showed normal LV systolic function. Heparin was fully reversed with protamine and the aortic and venous cannulas removed. Hemostasis was achieved. Mediastinal and left pleural drainage tubes were placed. The sternum was closed with  #6 stainless steel wires. The fascia was closed with continuous # 1 vicryl suture. The subcutaneous tissue was closed with 2-0 vicryl continuous suture. The skin was closed with 3-0 vicryl subcuticular suture. All sponge, needle, and instrument counts were reported correct at the end of the case. Dry sterile dressings were placed over the incisions and around the chest tubes which were connected to pleurevac suction. The patient was then transported to the surgical intensive care unit in stable condition.

## 2019-09-15 NOTE — Anesthesia Postprocedure Evaluation (Signed)
Anesthesia Post Note  Patient: Meghan Terry  Procedure(s) Performed: CORONARY ARTERY BYPASS GRAFTING (CABG) times three on pump using left internal mammary artery and right and left greater saphenous veins harvested endoscopically (N/A Chest) TRANSESOPHAGEAL ECHOCARDIOGRAM (TEE) (N/A )     Patient location during evaluation: SICU Anesthesia Type: General Level of consciousness: sedated Pain management: pain level controlled Vital Signs Assessment: post-procedure vital signs reviewed and stable Respiratory status: patient remains intubated per anesthesia plan Cardiovascular status: stable Postop Assessment: no apparent nausea or vomiting Anesthetic complications: no    Last Vitals:  Vitals:   09/15/19 1515 09/15/19 1521  BP:  124/61  Pulse: 86 86  Resp: 14 13  Temp: (!) 36.1 C   SpO2: 100% 100%    Last Pain:  Vitals:   09/15/19 1400  TempSrc: Oral  PainSc:                  Meghan Terry P Meghan Terry

## 2019-09-15 NOTE — Progress Notes (Signed)
Primary Cardiiologist:  Revankar  Subjective:  For CABG this am with Dr Cyndia Bent  Objective:  Vitals:   09/15/19 0309 09/15/19 0513 09/15/19 0519 09/15/19 0534  BP: (!) 114/51 116/64 130/61 130/61  Pulse: 71 70 68 68  Resp: 18 16 18 18   Temp: 98.1 F (36.7 C) 97.7 F (36.5 C) 98.2 F (36.8 C) 98.2 F (36.8 C)  TempSrc: Oral Oral Oral Oral  SpO2: 99% 99% 100% 100%  Weight:   58.9 kg   Height:   5\' 3"  (1.6 m)     Intake/Output from previous day:  Intake/Output Summary (Last 24 hours) at 09/15/2019 0753 Last data filed at 09/15/2019 0600 Gross per 24 hour  Intake 920.35 ml  Output 2425 ml  Net -1504.65 ml    Physical Exam: Affect appropriate Healthy:  appears stated age HEENT: normal Neck supple with no adenopathy JVP normal no bruits no thyromegaly Lungs clear with no wheezing and good diaphragmatic motion Heart:  S1/S2 no murmur, no rub, gallop or click PMI normal Abdomen: benighn, BS positve, no tenderness, no AAA no bruit.  No HSM or HJR Distal pulses intact with no bruits No edema Neuro non-focal Skin warm and dry No muscular weakness  Lab Results: Basic Metabolic Panel: Recent Labs    09/14/19 2134 09/15/19 0448  NA 137 140  K 3.8 4.1  CL 112* 112*  CO2 19* 23  GLUCOSE 134* 111*  BUN 15 12  CREATININE 1.35* 1.42*  CALCIUM 8.1* 8.6*   Liver Function Tests: Recent Labs    09/14/19 2134  AST 14*  ALT 12  ALKPHOS 63  BILITOT 0.6  PROT 5.2*  ALBUMIN 3.0*   No results for input(s): LIPASE, AMYLASE in the last 72 hours. CBC: Recent Labs    09/14/19 1023 09/15/19 0448  WBC 6.3 6.6  HGB 12.2 10.9*  HCT 37.6 33.4*  MCV 90.8 90.8  PLT 264 242   Cardiac Enzymes: No results for input(s): CKTOTAL, CKMB, CKMBINDEX, TROPONINI in the last 72 hours. BNP: Invalid input(s): POCBNP D-Dimer: No results for input(s): DDIMER in the last 72 hours. Hemoglobin A1C: Recent Labs    09/14/19 1028  HGBA1C 5.8*   Fasting Lipid Panel: No results  for input(s): CHOL, HDL, LDLCALC, TRIG, CHOLHDL, LDLDIRECT in the last 72 hours. Thyroid Function Tests: No results for input(s): TSH, T4TOTAL, T3FREE, THYROIDAB in the last 72 hours.  Invalid input(s): FREET3 Anemia Panel: No results for input(s): VITAMINB12, FOLATE, FERRITIN, TIBC, IRON, RETICCTPCT in the last 72 hours.  Imaging: DG Chest 2 View  Result Date: 09/14/2019 CLINICAL DATA:  Pre operative respiratory exam. Coronary artery disease. EXAM: CHEST - 2 VIEW COMPARISON:  08/31/2019 FINDINGS: Heart size and pulmonary vascularity are normal and the lungs are clear. No bone abnormality. IMPRESSION: Normal exam. Electronically Signed   By: Lorriane Shire M.D.   On: 09/14/2019 21:37   CARDIAC CATHETERIZATION  Result Date: 09/14/2019  Mid RCA lesion is 75% stenosed.  Dist RCA lesion is 70% stenosed.  RPAV lesion is 75% stenosed.  1st Mrg lesion is 90% stenosed.  Prox Cx to Mid Cx lesion is 99% stenosed.  Ost LAD to Prox LAD lesion is 80% stenosed.  Mid LAD lesion is 50% stenosed.  Dist LAD lesion is 50% stenosed.  The left ventricular systolic function is normal.  LV end diastolic pressure is normal.  The left ventricular ejection fraction is 55-65% by visual estimate.  There is no aortic valve stenosis.  Severe three vessel disease.  Cardiac surgery consult.  Will start IV heparin given subtotal circ lesion.  Results discussed with the son.   ECHOCARDIOGRAM COMPLETE  Result Date: 09/14/2019   ECHOCARDIOGRAM REPORT   Patient Name:   Meghan Terry Date of Exam: 09/14/2019 Medical Rec #:  [id number]    Height:       63.0 in Accession #:    [id number]   Weight:       129.7 lb Date of Birth:  06/17/1961     BSA:          1.61 m Patient Age:    58 years     BP:           151/65 mmHg Patient Gender: F            HR:           79 bpm. Exam Location:  Inpatient Procedure: 2D Echo Indications:    CAD. pre-op testing  History:        Patient has no prior history of Echocardiogram examinations.                  CAD; Risk Factors:Hypertension and Former Smoker.  Sonographer:    Jannett Celestine RDCS (AE) Referring Phys: [place]  1. Left ventricular ejection fraction, by visual estimation, is 60 to 65%. The left ventricle has normal function. There is mildly increased left ventricular hypertrophy.  2. Left ventricular diastolic parameters are consistent with Grade I diastolic dysfunction (impaired relaxation).  3. The left ventricle has no regional wall motion abnormalities.  4. Global right ventricle has normal systolic function.The right ventricular size is normal. No increase in right ventricular wall thickness.  5. Left atrial size was normal.  6. Right atrial size was normal.  7. The mitral valve is abnormal. Mild mitral valve regurgitation.  8. The tricuspid valve is grossly normal. Tricuspid valve regurgitation is trivial.  9. The aortic valve is tricuspid. Aortic valve regurgitation is not visualized. 10. The pulmonic valve was grossly normal. Pulmonic valve regurgitation is not visualized. 11. The inferior vena cava is normal in size with <50% respiratory variability, suggesting right atrial pressure of 8 mmHg. FINDINGS  Left Ventricle: Left ventricular ejection fraction, by visual estimation, is 60 to 65%. The left ventricle has normal function. The left ventricle has no regional wall motion abnormalities. There is mildly increased left ventricular hypertrophy. Left ventricular diastolic parameters are consistent with Grade I diastolic dysfunction (impaired relaxation). Indeterminate filling pressures. Right Ventricle: The right ventricular size is normal. No increase in right ventricular wall thickness. Global RV systolic function is has normal systolic function. Left Atrium: Left atrial size was normal in size. Right Atrium: Right atrial size was normal in size Pericardium: There is no evidence of pericardial effusion. Mitral Valve: The mitral valve is abnormal. There is mild  thickening of the mitral valve leaflet(s). Mild mitral valve regurgitation. Tricuspid Valve: The tricuspid valve is grossly normal. Tricuspid valve regurgitation is trivial. Aortic Valve: The aortic valve is tricuspid. Aortic valve regurgitation is not visualized. Pulmonic Valve: The pulmonic valve was grossly normal. Pulmonic valve regurgitation is not visualized. Pulmonic regurgitation is not visualized. Aorta: The aortic root and ascending aorta are structurally normal, with no evidence of dilitation. Venous: The inferior vena cava is normal in size with less than 50% respiratory variability, suggesting right atrial pressure of 8 mmHg. IAS/Shunts: No atrial level shunt detected by color flow Doppler.  LEFT VENTRICLE PLAX 2D LVIDd:  3.80 cm  Diastology LVIDs:         2.50 cm  LV e' lateral:   11.30 cm/s LV PW:         1.20 cm  LV E/e' lateral: 8.6 LV IVS:        1.40 cm  LV e' medial:    7.29 cm/s LVOT diam:     1.90 cm  LV E/e' medial:  13.3 LV SV:         40 ml LV SV Index:   24.41 LVOT Area:     2.84 cm  RIGHT VENTRICLE RV S prime:     12.70 cm/s TAPSE (M-mode): 1.6 cm LEFT ATRIUM             Index       RIGHT ATRIUM           Index LA diam:        3.40 cm 2.11 cm/m  RA Area:     14.70 cm LA Vol (A2C):   38.3 ml 23.81 ml/m RA Volume:   34.80 ml  21.63 ml/m LA Vol (A4C):   33.1 ml 20.58 ml/m LA Biplane Vol: 35.8 ml 22.26 ml/m  AORTIC VALVE LVOT Vmax:   91.60 cm/s LVOT Vmean:  61.900 cm/s LVOT VTI:    0.222 m  AORTA Ao Root diam: 3.00 cm MITRAL VALVE MV Area (PHT): 2.84 cm             SHUNTS MV PHT:        77.43 msec           Systemic VTI:  0.22 m MV Decel Time: 267 msec             Systemic Diam: 1.90 cm MV E velocity: 96.80 cm/s 103 cm/s MV A velocity: 71.60 cm/s 70.3 cm/s MV E/A ratio:  1.35       1.5  Lyman Bishop MD Electronically signed by Lyman Bishop MD Signature Date/Time: 09/14/2019/4:20:57 PM    Final    VAS US DOPPLER PRE CABG  Result Date: 09/14/2019 PREOPERATIVE VASCULAR  EVALUATION  Indications:  Pre-CABG. Risk Factors: Hypertension, hyperlipidemia. Performing Technologist: June Leap RDMS, RVT  Examination Guidelines: A complete evaluation includes B-mode imaging, spectral Doppler, color Doppler, and power Doppler as needed of all accessible portions of each vessel. Bilateral testing is considered an integral part of a complete examination. Limited examinations for reoccurring indications may be performed as noted.  Right Carotid Findings: +----------+--------+--------+--------+------------+--------+           PSV cm/sEDV cm/sStenosisDescribe    Comments +----------+--------+--------+--------+------------+--------+ CCA Prox  112     20                                   +----------+--------+--------+--------+------------+--------+ CCA Distal73      26                                   +----------+--------+--------+--------+------------+--------+ ICA Prox  126     29      1-39%   heterogenous         +----------+--------+--------+--------+------------+--------+ ICA Distal100     30                                   +----------+--------+--------+--------+------------+--------+  ECA       110     12                                   +----------+--------+--------+--------+------------+--------+ Portions of this table do not appear on this page. +----------+--------+-------+----------------+------------+           PSV cm/sEDV cmsDescribe        Arm Pressure +----------+--------+-------+----------------+------------+ Subclavian204            Multiphasic, WNL             +----------+--------+-------+----------------+------------+ +---------+--------+--+--------+--+---------+ VertebralPSV cm/s68EDV cm/s13Antegrade +---------+--------+--+--------+--+---------+ Left Carotid Findings: +----------+--------+--------+--------+------------+--------+           PSV cm/sEDV cm/sStenosisDescribe    Comments  +----------+--------+--------+--------+------------+--------+ CCA Prox  99      21                                   +----------+--------+--------+--------+------------+--------+ CCA Distal75      17                                   +----------+--------+--------+--------+------------+--------+ ICA Prox  171     57      40-59%  heterogenous         +----------+--------+--------+--------+------------+--------+ ICA Mid   135     34                                   +----------+--------+--------+--------+------------+--------+ ICA Distal111     17                                   +----------+--------+--------+--------+------------+--------+ ECA       115     13                                   +----------+--------+--------+--------+------------+--------+ +----------+--------+--------+----------------+------------+ SubclavianPSV cm/sEDV cm/sDescribe        Arm Pressure +----------+--------+--------+----------------+------------+           217             Multiphasic, [id number]          +----------+--------+--------+----------------+------------+ +---------+--------+---+--------+--+---------+ VertebralPSV cm/s107EDV cm/s25Antegrade +---------+--------+---+--------+--+---------+  Right Doppler Findings: +--------+--------+-----+---------+---------------------------+ Site    PressureIndexDoppler  Comments                    +--------+--------+-----+---------+---------------------------+ Brachial             triphasicnot obtained due to TR band +--------+--------+-----+---------+---------------------------+ Radial               triphasic                            +--------+--------+-----+---------+---------------------------+ Ulnar                triphasic                            +--------+--------+-----+---------+---------------------------+  Left Doppler Findings: +--------+--------+-----+---------+--------+ Site     PressureIndexDoppler  Comments +--------+--------+-----+---------+--------+ [id number]  triphasic         +--------+--------+-----+---------+--------+ Radial               triphasic         +--------+--------+-----+---------+--------+ Ulnar                triphasic         +--------+--------+-----+---------+--------+  Summary: Right Carotid: Velocities in the right ICA are consistent with a 1-39% stenosis. Left Carotid: Velocities in the left ICA are consistent with a 40-59% stenosis. ABI: Pedal artery waveforms within normal limits. Right Upper Extremity: Not obtained due to TR band in place. Left Upper Extremity: Doppler waveforms remain within normal limits with left radial compression. Doppler waveforms remain within normal limits with left ulnar compression.  Electronically signed by Servando Snare MD on 09/14/2019 at 3:36:23 PM.    Final     Cardiac Studies:  ECG: SR T inversions 3,F   Telemetry:  NSR   Echo:   Medications:   . [MAR Hold] aspirin EC  81 mg Oral Daily  . bisacodyl  5 mg Oral Once  . [MAR Hold] buPROPion  300 mg Oral Daily  . Chlorhexidine Gluconate Cloth  6 each Topical Once  . epinephrine  0-10 mcg/min Intravenous To OR  . heparin-papaverine-plasmalyte irrigation   Irrigation To OR  . [MAR Hold] insulin aspart  0-15 Units Subcutaneous TID WC  . insulin   Intravenous To OR  . [MAR Hold] levothyroxine  75 mcg Oral QAC breakfast  . [MAR Hold] losartan  50 mg Oral q morning - 10a  . magnesium sulfate  40 mEq Other To OR  . [MAR Hold] metoprolol tartrate  25 mg Oral Daily  . [MAR Hold] mupirocin ointment  1 application Nasal BID  . phenylephrine  30-200 mcg/min Intravenous To OR  . potassium chloride  80 mEq Other To OR  . [MAR Hold] rOPINIRole  1 mg Oral TID WC  . [MAR Hold] rOPINIRole  4 mg Oral QHS  . [MAR Hold] sodium chloride flush  3 mL Intravenous Q12H  . [MAR Hold] topiramate  100 mg Oral BID  . tranexamic acid  15 mg/kg  Intravenous To OR  . tranexamic acid  2 mg/kg Intracatheter To OR  . [MAR Hold] traZODone  150 mg Oral QHS     . [MAR Hold] sodium chloride    . cefUROXime (ZINACEF)  IV    . cefUROXime (ZINACEF)  IV    . dexmedetomidine    . heparin 30,000 units/NS 1000 mL solution for CELLSAVER    . heparin Stopped (09/15/19 0550)  . milrinone    . nitroGLYCERIN    . norepinephrine    . tranexamic acid (CYKLOKAPRON) infusion ([facility])    . vancomycin      Assessment/Plan:   1. CAD:  Severe 3 vessel disease. For CABG this am Dr Cyndia Bent Pre CABG dopplers ok 2. DM:  Discussed low carb diet.  Target hemoglobin A1c is 6.5 or less.  Continue current medications. 3. HLD:  Statin on d/c   Jenkins Rouge 09/15/2019, 7:53 AM

## 2019-09-15 NOTE — Progress Notes (Signed)
CT surgery p.m. Rounds  Patient awake on ventilator on IMV 4, undergoing vent wean Stable hemodynamics Minimal chest tube output Adequate urine output Doing well after CABG

## 2019-09-15 NOTE — Brief Op Note (Signed)
09/14/2019 - 09/15/2019  7:34 AM  PATIENT:  Meghan Terry  58 y.o. female  PRE-OPERATIVE DIAGNOSIS:  SEVERE MULTI-VESSEL CORONARY ARTERY DISEASE  POST-OPERATIVE DIAGNOSIS:  SEVERE MULTI-VESSEL CORONARY ARTERY DISEASE  PROCEDURE:  Procedure(s):  CORONARY ARTERY BYPASS GRAFTING x 3 -LIMA to LAD -SVG to OM -SVG to PDA  ENDOSCOPIC HARVEST GREATER SAPHENOUS VEIN -Right Leg (below the knee is too small to use) -Left Thigh  TRANSESOPHAGEAL ECHOCARDIOGRAM (TEE) (N/A)  SURGEON:  Surgeon(s) and Role:    * Bartle, Fernande Boyden, MD - Primary  PHYSICIAN ASSISTANT: Ellwood Handler PA-C  ANESTHESIA:   general  EBL:  800 mL   BLOOD ADMINISTERED:2U PRBC and CELLSAVER  DRAINS: Left Pleural Chest Tubes, Mediastinal Chest Drains   LOCAL MEDICATIONS USED:  NONE  SPECIMEN:  No Specimen  DISPOSITION OF SPECIMEN:  N/A  COUNTS:  YES  TOURNIQUET:  * No tourniquets in log *  DICTATION: .Dragon Dictation  PLAN OF CARE: Admit to inpatient   PATIENT DISPOSITION:  ICU - intubated and critically ill.   Delay start of Pharmacological VTE agent (>24hrs) due to surgical blood loss or risk of bleeding: yes

## 2019-09-15 NOTE — Anesthesia Procedure Notes (Signed)
Central Venous Catheter Insertion Performed by: Annye Asa, MD, anesthesiologist Start/End12/18/2020 6:22 AM, 09/15/2019 6:36 AM Preanesthetic checklist: patient identified, IV checked, risks and benefits discussed, surgical consent, monitors and equipment checked, pre-op evaluation, timeout performed and anesthesia consent Position: supine Lidocaine 1% used for infiltration and patient sedated Hand hygiene performed , maximum sterile barriers used  and Seldinger technique used Catheter size: 8.5 Fr PA cath was placed.Sheath introducer Swan type:thermodilution Procedure performed using ultrasound guided technique. Ultrasound Notes:anatomy identified, needle tip was noted to be adjacent to the nerve/plexus identified, no ultrasound evidence of intravascular and/or intraneural injection and image(s) printed for medical record Attempts: 1 Following insertion, line sutured, dressing applied and Biopatch. Post procedure assessment: blood return through all ports, free fluid flow and no air  Patient tolerated the procedure well with no immediate complications. Additional procedure comments: PA catheter:  Routine monitors. Timeout, sterile prep, drape, FBP R neck.  Supine position.  1% Lido local, finder and trocar RIJ 1st pass with US guidance.  Cordis placed over J wire. PA catheter in easily.  Sterile dressing applied.  Patient tolerated well, VSS.  Jenita Seashore, MD.

## 2019-09-15 NOTE — Anesthesia Procedure Notes (Signed)
Arterial Line Insertion Start/End12/18/2020 6:40 AM, 09/15/2019 6:45 AM Performed by: Bryson Corona, CRNA, CRNA  Patient location: Pre-op. Preanesthetic checklist: patient identified, IV checked, site marked, risks and benefits discussed, surgical consent, monitors and equipment checked, pre-op evaluation, timeout performed and anesthesia consent Lidocaine 1% used for infiltration Left, radial was placed Catheter size: 20 Fr Hand hygiene performed  and maximum sterile barriers used   Attempts: 1 Procedure performed without using ultrasound guided technique. Following insertion, dressing applied and Biopatch. Post procedure assessment: normal and unchanged  Patient tolerated the procedure well with no immediate complications.

## 2019-09-15 NOTE — Transfer of Care (Signed)
Immediate Anesthesia Transfer of Care Note  Patient: Meghan Terry  Procedure(s) Performed: CORONARY ARTERY BYPASS GRAFTING (CABG) times three on pump using left internal mammary artery and right and left greater saphenous veins harvested endoscopically (N/A Chest) TRANSESOPHAGEAL ECHOCARDIOGRAM (TEE) (N/A )  Patient Location: ICU  Anesthesia Type:General  Level of Consciousness: Patient remains intubated per anesthesia plan  Airway & Oxygen Therapy: Patient remains intubated per anesthesia plan and Patient placed on Ventilator (see vital sign flow sheet for setting)  Post-op Assessment: Report given to RN and Post -op Vital signs reviewed and stable  Post vital signs: Reviewed and stable  Last Vitals:  Vitals Value Taken Time  BP 142/71   Temp    Pulse 96   Resp 16   SpO2 100     Last Pain:  Vitals:   09/15/19 0534  TempSrc: Oral  PainSc:          Complications: No apparent anesthesia complications

## 2019-09-15 NOTE — Progress Notes (Signed)
Dr. Cyndia Bent made aware of the patient's blood pressure sustaining in the upper 140-150s after receiving a dose of Morphine, Lopressor and on Nitro drip. Telephone order received for Hydralazine IV prn.

## 2019-09-15 NOTE — Procedures (Signed)
Extubation Procedure Note  Patient Details:   Name: Meghan Terry DOB: [birth year] MRN: [id number]   Airway Documentation:    Vent end date: 09/15/19 Vent end time: 2032   Evaluation  O2 sats: stable throughout Complications: No apparent complications Patient did tolerate procedure well. Bilateral Breath Sounds: Diminished   Yes pt able to vocalize.   Pt extubated at this time per Rapid wean protocol. Nif of -20, and Vital capacity of 0.95 L performed. Pt was able to breathe around deflated cuff. No stridor noted. Adequate cough noted.   Irineo Axon [facility] 09/15/2019, 8:39 PM

## 2019-09-15 NOTE — Progress Notes (Signed)
  Echocardiogram Echocardiogram Transesophageal has been performed.  Darlina Sicilian M 09/15/2019, 8:25 AM

## 2019-09-15 NOTE — Anesthesia Procedure Notes (Signed)
Procedure Name: Intubation Date/Time: 09/15/2019 7:54 AM Performed by: Bryson Corona, CRNA Pre-anesthesia Checklist: Patient identified, Emergency Drugs available, Suction available and Patient being monitored Patient Re-evaluated:Patient Re-evaluated prior to induction Oxygen Delivery Method: Circle System Utilized Preoxygenation: Pre-oxygenation with 100% oxygen Induction Type: IV induction Ventilation: Mask ventilation without difficulty Laryngoscope Size: Mac and 3 Grade View: Grade I Tube type: Oral Tube size: 8.0 mm Number of attempts: 1 Airway Equipment and Method: Stylet Placement Confirmation: ETT inserted through vocal cords under direct vision,  positive ETCO2 and breath sounds checked- equal and bilateral Secured at: 22 cm Tube secured with: Tape Dental Injury: Teeth and Oropharynx as per pre-operative assessment

## 2019-09-16 ENCOUNTER — Inpatient Hospital Stay (HOSPITAL_COMMUNITY): Payer: Commercial Managed Care - PPO

## 2019-09-16 DIAGNOSIS — E119 Type 2 diabetes mellitus without complications: Secondary | ICD-10-CM

## 2019-09-16 DIAGNOSIS — I10 Essential (primary) hypertension: Secondary | ICD-10-CM

## 2019-09-16 LAB — GLUCOSE, CAPILLARY
Glucose-Capillary: 107 mg/dL — ABNORMAL HIGH (ref 70–99)
Glucose-Capillary: 114 mg/dL — ABNORMAL HIGH (ref 70–99)
Glucose-Capillary: 115 mg/dL — ABNORMAL HIGH (ref 70–99)
Glucose-Capillary: 116 mg/dL — ABNORMAL HIGH (ref 70–99)
Glucose-Capillary: 117 mg/dL — ABNORMAL HIGH (ref 70–99)
Glucose-Capillary: 119 mg/dL — ABNORMAL HIGH (ref 70–99)
Glucose-Capillary: 120 mg/dL — ABNORMAL HIGH (ref 70–99)
Glucose-Capillary: 121 mg/dL — ABNORMAL HIGH (ref 70–99)
Glucose-Capillary: 125 mg/dL — ABNORMAL HIGH (ref 70–99)
Glucose-Capillary: 134 mg/dL — ABNORMAL HIGH (ref 70–99)
Glucose-Capillary: 159 mg/dL — ABNORMAL HIGH (ref 70–99)
Glucose-Capillary: 94 mg/dL (ref 70–99)

## 2019-09-16 LAB — BASIC METABOLIC PANEL
Anion gap: 6 (ref 5–15)
Anion gap: 8 (ref 5–15)
BUN: 14 mg/dL (ref 6–20)
BUN: 17 mg/dL (ref 6–20)
CO2: 24 mmol/L (ref 22–32)
CO2: 22 mmol/L (ref 22–32)
Calcium: 7.9 mg/dL — ABNORMAL LOW (ref 8.9–10.3)
Calcium: 8.3 mg/dL — ABNORMAL LOW (ref 8.9–10.3)
Chloride: 110 mmol/L (ref 98–111)
Chloride: 109 mmol/L (ref 98–111)
Creatinine, Ser: 1.1 mg/dL — ABNORMAL HIGH (ref 0.44–1.00)
Creatinine, Ser: 1.37 mg/dL — ABNORMAL HIGH (ref 0.44–1.00)
GFR calc Af Amer: >60 mL/min (ref >60)
GFR calc Af Amer: 48 mL/min — ABNORMAL LOW (ref >60)
GFR calc non Af Amer: 55 mL/min — ABNORMAL LOW (ref >60)
GFR calc non Af Amer: 42 mL/min — ABNORMAL LOW (ref >60)
Glucose, Bld: 127 mg/dL — ABNORMAL HIGH (ref 70–99)
Glucose, Bld: 140 mg/dL — ABNORMAL HIGH (ref 70–99)
Potassium: 3.7 mmol/L (ref 3.5–5.1)
Potassium: 3.9 mmol/L (ref 3.5–5.1)
Sodium: 140 mmol/L (ref 135–145)
Sodium: 139 mmol/L (ref 135–145)

## 2019-09-16 LAB — CBC
HCT: 27.7 % — ABNORMAL LOW (ref 36.0–46.0)
HCT: 30.3 % — ABNORMAL LOW (ref 36.0–46.0)
Hemoglobin: 9.4 g/dL — ABNORMAL LOW (ref 12.0–15.0)
Hemoglobin: 10.2 g/dL — ABNORMAL LOW (ref 12.0–15.0)
MCH: 30.2 pg (ref 26.0–34.0)
MCH: 29.7 pg (ref 26.0–34.0)
MCHC: 33.9 g/dL (ref 30.0–36.0)
MCHC: 33.7 g/dL (ref 30.0–36.0)
MCV: 89.1 fL (ref 80.0–100.0)
MCV: 88.1 fL (ref 80.0–100.0)
Platelets: 146 10*3/uL — ABNORMAL LOW (ref 150–400)
Platelets: 166 10*3/uL (ref 150–400)
RBC: 3.11 MIL/uL — ABNORMAL LOW (ref 3.87–5.11)
RBC: 3.44 MIL/uL — ABNORMAL LOW (ref 3.87–5.11)
RDW: 13.6 % (ref 11.5–15.5)
RDW: 13.6 % (ref 11.5–15.5)
WBC: 10.4 10*3/uL (ref 4.0–10.5)
WBC: 13.4 10*3/uL — ABNORMAL HIGH (ref 4.0–10.5)
nRBC: 0 % (ref 0.0–0.2)
nRBC: 0 % (ref 0.0–0.2)

## 2019-09-16 LAB — POCT I-STAT 7, (LYTES, BLD GAS, ICA,H+H)
Acid-base deficit: 5 mmol/L — ABNORMAL HIGH (ref 0.0–2.0)
Acid-base deficit: 2 mmol/L (ref 0.0–2.0)
Bicarbonate: 20.9 mmol/L (ref 20.0–28.0)
Bicarbonate: 23.9 mmol/L (ref 20.0–28.0)
Calcium, Ion: 1.13 mmol/L — ABNORMAL LOW (ref 1.15–1.40)
Calcium, Ion: 1.2 mmol/L (ref 1.15–1.40)
HCT: 25 % — ABNORMAL LOW (ref 36.0–46.0)
HCT: 35 % — ABNORMAL LOW (ref 36.0–46.0)
Hemoglobin: 8.5 g/dL — ABNORMAL LOW (ref 12.0–15.0)
Hemoglobin: 11.9 g/dL — ABNORMAL LOW (ref 12.0–15.0)
O2 Saturation: 99 %
O2 Saturation: 99 %
Patient temperature: 37
Patient temperature: 37.3
Potassium: 3.6 mmol/L (ref 3.5–5.1)
Potassium: 3.8 mmol/L (ref 3.5–5.1)
Sodium: 145 mmol/L (ref 135–145)
Sodium: 143 mmol/L (ref 135–145)
TCO2: 22 mmol/L (ref 22–32)
TCO2: 25 mmol/L (ref 22–32)
pCO2 arterial: 38.9 mmHg (ref 32.0–48.0)
pCO2 arterial: 45 mmHg (ref 32.0–48.0)
pH, Arterial: 7.339 — ABNORMAL LOW (ref 7.350–7.450)
pH, Arterial: 7.335 — ABNORMAL LOW (ref 7.350–7.450)
pO2, Arterial: 146 mmHg — ABNORMAL HIGH (ref 83.0–108.0)
pO2, Arterial: 159 mmHg — ABNORMAL HIGH (ref 83.0–108.0)

## 2019-09-16 LAB — MAGNESIUM
Magnesium: 2.6 mg/dL — ABNORMAL HIGH (ref 1.7–2.4)
Magnesium: 2.4 mg/dL (ref 1.7–2.4)

## 2019-09-16 MED ORDER — FUROSEMIDE 10 MG/ML IJ SOLN
20.0000 mg | Freq: Two times a day (BID) | INTRAMUSCULAR | Status: DC
  Administered 2019-09-16 – 2019-09-17 (×3): 20 mg via INTRAVENOUS
  Filled 2019-09-16 (×2): qty 2

## 2019-09-16 MED ORDER — MIDAZOLAM HCL 2 MG/2ML IJ SOLN: 2.0000 mg | INTRAMUSCULAR | Status: DC | PRN

## 2019-09-16 MED ORDER — POTASSIUM CHLORIDE 10 MEQ/50ML IV SOLN
10.0000 meq | INTRAVENOUS | Status: AC
  Administered 2019-09-16 (×3): 10 meq via INTRAVENOUS
  Filled 2019-09-16 (×3): qty 50

## 2019-09-16 MED ORDER — INSULIN ASPART 100 UNIT/ML ~~LOC~~ SOLN
0.0000 [IU] | SUBCUTANEOUS | Status: DC
  Administered 2019-09-16 – 2019-09-17 (×2): 2 [IU] via SUBCUTANEOUS

## 2019-09-16 NOTE — Progress Notes (Signed)
CT surgery p.m. Rounds  Patient sitting up in chair on room air Sinus rhythm Off all drips To walk in hallway P.m. labs results reviewed and are satisfactory Continue current care

## 2019-09-16 NOTE — Progress Notes (Signed)
1 Day Post-Op Procedure(s) (LRB): CORONARY ARTERY BYPASS GRAFTING (CABG) times three on pump using left internal mammary artery and right and left greater saphenous veins harvested endoscopically (N/A) TRANSESOPHAGEAL ECHOCARDIOGRAM (TEE) (N/A) Subjective: Doing well CABGx3  Objective: Vital signs in last 24 hours: Temp:  [95.4 F (35.2 C)-99.5 F (37.5 C)] 97.7 F (36.5 C) (12/19 0700) Pulse Rate:  [71-91] 71 (12/19 0700) Cardiac Rhythm: Normal sinus rhythm (12/19 0400) Resp:  [12-28] 17 (12/19 0700) BP: (76-171)/(36-70) 102/55 (12/19 0700) SpO2:  [96 %-100 %] 96 % (12/19 0700) Arterial Line BP: (106-158)/(44-71) 127/52 (12/19 0700) FiO2 (%):  [30 %-50 %] 40 % (12/18 2000) Weight:  [64.6 kg] 64.6 kg (12/19 0500)  Hemodynamic parameters for last 24 hours: PAP: (13-29)/(8-20) 27/12 CO:  [2.9 L/min-5.6 L/min] 5.1 L/min CI:  [1.8 L/min/m2-3.5 L/min/m2] 3.2 L/min/m2  Intake/Output from previous day: 12/18 0701 - 12/19 0700 In: 4366.6 [I.V.:2420.5; Blood:620; IV Piggyback:1326.1] Out: [id number] [Urine:1790; Blood:800; Chest Tube:860] Intake/Output this shift: No intake/output data recorded.       Exam    General- alert and comfortable    Neck- no JVD, no cervical adenopathy palpable, no carotid bruit   Lungs- clear without rales, wheezes   Cor- regular rate and rhythm, no murmur , gallop   Abdomen- soft, non-tender   Extremities - warm, non-tender, minimal edema   Neuro- oriented, appropriate, no focal weakness   Lab Results: Recent Labs    09/15/19 1920 09/15/19 2159 09/16/19 0429  WBC 13.0*  --  10.4  HGB 10.4* 11.9* 9.4*  HCT 30.8* 35.0* 27.7*  PLT 157  --  146*   BMET:  Recent Labs    09/15/19 1920 09/15/19 2159 09/16/19 0429  NA 143 143 140  K 4.1 3.8 3.7  CL 115*  --  110  CO2 23  --  24  GLUCOSE 164*  --  127*  BUN 12  --  14  CREATININE 1.15*  --  1.10*  CALCIUM 7.5*  --  7.9*    PT/INR:  Recent Labs    09/15/19 1256  LABPROT 16.1*  INR 1.3*    ABG    Component Value Date/Time   PHART 7.335 (L) 09/15/2019 2159   HCO3 23.9 09/15/2019 2159   TCO2 25 09/15/2019 2159   ACIDBASEDEF 2.0 09/15/2019 2159   O2SAT 99.0 09/15/2019 2159   CBG (last 3)  Recent Labs    09/16/19 0432 09/16/19 0633 09/16/19 0741  GLUCAP 117* 119* 116*    Assessment/Plan: S/P Procedure(s) (LRB): CORONARY ARTERY BYPASS GRAFTING (CABG) times three on pump using left internal mammary artery and right and left greater saphenous veins harvested endoscopically (N/A) TRANSESOPHAGEAL ECHOCARDIOGRAM (TEE) (N/A) Mobilize Diuresis Diabetes control d/c tubes/lines See progression orders   LOS: 2 days    Tharon Aquas Trigt III 09/16/2019

## 2019-09-16 NOTE — Plan of Care (Signed)
  Problem: Education: Goal: Knowledge of General Education information will improve Description: Including pain rating scale, medication(s)/side effects and non-pharmacologic comfort measures Outcome: Progressing   Problem: Health Behavior/Discharge Planning: Goal: Ability to manage health-related needs will improve Outcome: Progressing   Problem: Clinical Measurements: Goal: Ability to maintain clinical measurements within normal limits will improve Outcome: Progressing Goal: Will remain free from infection Outcome: Progressing Goal: Diagnostic test results will improve Outcome: Progressing Goal: Respiratory complications will improve Outcome: Progressing Goal: Cardiovascular complication will be avoided Outcome: Progressing   Problem: Activity: Goal: Risk for activity intolerance will decrease Outcome: Progressing   Problem: Nutrition: Goal: Adequate nutrition will be maintained Outcome: Progressing   Problem: Coping: Goal: Level of anxiety will decrease Outcome: Progressing   Problem: Elimination: Goal: Will not experience complications related to bowel motility Outcome: Progressing Goal: Will not experience complications related to urinary retention Outcome: Progressing   Problem: Pain Managment: Goal: General experience of comfort will improve Outcome: Progressing   Problem: Safety: Goal: Ability to remain free from injury will improve Outcome: Progressing   Problem: Skin Integrity: Goal: Risk for impaired skin integrity will decrease Outcome: Progressing   Problem: Education: Goal: Understanding of CV disease, CV risk reduction, and recovery process will improve Outcome: Progressing Goal: Individualized Educational Video(s) Outcome: Progressing   Problem: Activity: Goal: Ability to return to baseline activity level will improve Outcome: Progressing   Problem: Cardiovascular: Goal: Ability to achieve and maintain adequate cardiovascular perfusion  will improve Outcome: Progressing   Problem: Health Behavior/Discharge Planning: Goal: Ability to safely manage health-related needs after discharge will improve Outcome: Progressing   Problem: Education: Goal: Understanding of cardiac disease, CV risk reduction, and recovery process will improve Outcome: Progressing Goal: Individualized Educational Video(s) Outcome: Progressing   Problem: Activity: Goal: Ability to tolerate increased activity will improve Outcome: Progressing   Problem: Cardiac: Goal: Ability to achieve and maintain adequate cardiovascular perfusion will improve Outcome: Progressing   Problem: Health Behavior/Discharge Planning: Goal: Ability to safely manage health-related needs after discharge will improve Outcome: Progressing   Problem: Education: Goal: Will demonstrate proper wound care and an understanding of methods to prevent future damage Outcome: Progressing Goal: Knowledge of disease or condition will improve Outcome: Progressing Goal: Knowledge of the prescribed therapeutic regimen will improve Outcome: Progressing Goal: Individualized Educational Video(s) Outcome: Progressing   Problem: Activity: Goal: Risk for activity intolerance will decrease Outcome: Progressing   Problem: Cardiac: Goal: Will achieve and/or maintain hemodynamic stability Outcome: Progressing   Problem: Clinical Measurements: Goal: Postoperative complications will be avoided or minimized Outcome: Progressing   Problem: Respiratory: Goal: Respiratory status will improve Outcome: Progressing   Problem: Skin Integrity: Goal: Wound healing without signs and symptoms of infection Outcome: Progressing Goal: Risk for impaired skin integrity will decrease Outcome: Progressing   Problem: Urinary Elimination: Goal: Ability to achieve and maintain adequate renal perfusion and functioning will improve Outcome: Progressing

## 2019-09-16 NOTE — Progress Notes (Signed)
Progress Note  Patient Name: Meghan Terry Date of Encounter: 09/16/2019  Primary Cardiologist: Revankar  Subjective   Extubated, awake, alert this AM. Feels pain is well controlled, believes she may have passed gas. Mild should pain improves with repositioning.  Inpatient Medications    Scheduled Meds: . acetaminophen  1,000 mg Oral Q6H   Or  . acetaminophen (TYLENOL) oral liquid 160 mg/5 mL  1,000 mg Per Tube Q6H  . acetaminophen (TYLENOL) oral liquid 160 mg/5 mL  650 mg Per Tube Once   Or  . acetaminophen  650 mg Rectal Once  . aspirin EC  325 mg Oral Daily   Or  . aspirin  324 mg Per Tube Daily  . bisacodyl  10 mg Oral Daily   Or  . bisacodyl  10 mg Rectal Daily  . buPROPion  300 mg Oral Daily  . Chlorhexidine Gluconate Cloth  6 each Topical Daily  . docusate sodium  200 mg Oral Daily  . levothyroxine  75 mcg Oral QAC breakfast  . metoprolol tartrate  12.5 mg Oral BID   Or  . metoprolol tartrate  12.5 mg Per Tube BID  . mupirocin ointment  1 application Nasal BID  . [START ON 09/17/2019] pantoprazole  40 mg Oral Daily  . rOPINIRole  1 mg Oral TID WC  . rOPINIRole  4 mg Oral QHS  . sodium chloride flush  3 mL Intravenous Q12H  . topiramate  100 mg Oral BID  . traZODone  150 mg Oral QHS   Continuous Infusions: . sodium chloride    . sodium chloride    . sodium chloride 10 mL/hr at 09/15/19 2100  . cefUROXime (ZINACEF)  IV 1.5 g (09/16/19 0508)  . dexmedetomidine (PRECEDEX) IV infusion Stopped (09/15/19 1525)  . insulin 1 mL/hr at 09/16/19 0500  . lactated ringers    . lactated ringers Stopped (09/15/19 1938)  . lactated ringers 20 mL/hr at 09/16/19 0500  . nitroGLYCERIN 5 mcg/min (09/15/19 1242)  . phenylephrine (NEO-SYNEPHRINE) Adult infusion Stopped (09/15/19 1309)  . potassium chloride 10 mEq (09/16/19 0832)   PRN Meds: sodium chloride, dextrose, hydrALAZINE, lactated ringers, metoprolol tartrate, midazolam, morphine injection, ondansetron (ZOFRAN) IV,  oxyCODONE, sodium chloride flush, traMADol   Vital Signs    Vitals:   09/16/19 0400 09/16/19 0500 09/16/19 0600 09/16/19 0700  BP: (!) 110/58 116/63 (!) 99/56 (!) 102/55  Pulse: 78 80 74 71  Resp: (!) 21 15 18 17   Temp: 98.8 F (37.1 C) 98.8 F (37.1 C) 98.1 F (36.7 C) 97.7 F (36.5 C)  TempSrc: Core     SpO2: 100% 97% 96% 96%  Weight:  64.6 kg    Height:        Intake/Output Summary (Last 24 hours) at 09/16/2019 0849 Last data filed at 09/16/2019 0600 Gross per 24 hour  Intake 3954.12 ml  Output 3400 ml  Net 554.12 ml   Last 3 Weights 09/16/2019 09/15/2019 09/14/2019  Weight (lbs) 142 lb 6.7 oz 129 lb 12.8 oz 129 lb 11.2 oz  Weight (kg) 64.6 kg 58.877 kg 58.832 kg  Some encounter information is confidential and restricted. Go to Review Flowsheets activity to see all data.      Telemetry    NSR with rare PVCs - Personally Reviewed  ECG    Today NSR - Personally Reviewed  Physical Exam   GEN: No acute distress.   Neck: Swan in place R IJ. Cardiac: RRR, no murmurs, rubs, or gallops.  Respiratory: Clear to auscultation bilaterally anterior and laterally, chest tubes in place GI: Soft, nontender, non-distended  MS: No edema; No deformity. Neuro:  Nonfocal  Psych: Normal affect   Labs    High Sensitivity Troponin:  No results for input(s): TROPONINIHS in the last 720 hours.    Chemistry Recent Labs  Lab 09/14/19 2134 09/15/19 0448 09/15/19 1147 09/15/19 1920 09/15/19 2001 09/15/19 2159 09/16/19 0429  NA 137 140 141 143 145 143 140  K 3.8 4.1 4.1 4.1 3.6 3.8 3.7  CL 112* 112* 108 115*  --   --  110  CO2 19* 23  --  23  --   --  24  GLUCOSE 134* 111* 123* 164*  --   --  127*  BUN 15 12 10 12   --   --  14  CREATININE 1.35* 1.42* 0.90 1.15*  --   --  1.10*  CALCIUM 8.1* 8.6*  --  7.5*  --   --  7.9*  PROT 5.2*  --   --   --   --   --   --   ALBUMIN 3.0*  --   --   --   --   --   --   AST 14*  --   --   --   --   --   --   ALT 12  --   --   --   --    --   --   ALKPHOS 63  --   --   --   --   --   --   BILITOT 0.6  --   --   --   --   --   --   GFRNONAA 43* 40*  --  52*  --   --  55*  GFRAA 49* 46*  --  60*  --   --  >60  ANIONGAP 6 5  --  5  --   --  6     Hematology Recent Labs  Lab 09/15/19 1256 09/15/19 1920 09/15/19 2001 09/15/19 2159 09/16/19 0429  WBC 10.2 13.0*  --   --  10.4  RBC 4.06 3.51*  --   --  3.11*  HGB 12.1 10.4* 8.5* 11.9* 9.4*  HCT 35.8* 30.8* 25.0* 35.0* 27.7*  MCV 88.2 87.7  --   --  89.1  MCH 29.8 29.6  --   --  30.2  MCHC 33.8 33.8  --   --  33.9  RDW 13.2 13.5  --   --  13.6  PLT 127* 157  --   --  146*    BNPNo results for input(s): BNP, PROBNP in the last 168 hours.   DDimer No results for input(s): DDIMER in the last 168 hours.   Radiology    DG Chest 2 View  Result Date: 09/14/2019 CLINICAL DATA:  Pre operative respiratory exam. Coronary artery disease. EXAM: CHEST - 2 VIEW COMPARISON:  08/31/2019 FINDINGS: Heart size and pulmonary vascularity are normal and the lungs are clear. No bone abnormality. IMPRESSION: Normal exam. Electronically Signed   By: Lorriane Shire M.D.   On: 09/14/2019 21:37   DG Chest Port 1 View  Result Date: 09/16/2019 CLINICAL DATA:  CABG EXAM: PORTABLE CHEST 1 VIEW COMPARISON:  Yesterday FINDINGS: Tracheal and esophageal extubation. Swan-Ganz catheter from the right with tip the main pulmonary artery level. Chest drains in stable position. Lower lung volumes with increased hazy opacity at the bases.  No pneumothorax or pulmonary edema IMPRESSION: 1. Extubation with lower lung volumes. Mild increase in basal atelectasis and small pleural fluid. 2. No visible pneumothorax. Electronically Signed   By: Monte Fantasia M.D.   On: 09/16/2019 08:23   DG Chest Port 1 View  Result Date: 09/15/2019 CLINICAL DATA:  Post CABG EXAM: PORTABLE CHEST 1 VIEW COMPARISON:  09/14/2019 FINDINGS: Endotracheal tube is approximately 4 cm above the carina. Right IJ Swan-Ganz catheter tip  overlies region of the main pulmonary artery bifurcation. Enteric tube passes into the stomach. Left apically directed chest tube. A mediastinal drain is present. No pneumothorax. Lungs are clear. No significant pleural effusion. Stable cardiomediastinal contours with normal heart size and evidence of interval CABG. IMPRESSION: Lines and tubes as above.  No pneumothorax. Electronically Signed   By: Macy Mis M.D.   On: 09/15/2019 13:21   ECHOCARDIOGRAM COMPLETE  Result Date: 09/14/2019   ECHOCARDIOGRAM REPORT   Patient Name:   Meghan Terry Date of Exam: 09/14/2019 Medical Rec #:  [id number]    Height:       63.0 in Accession #:    [id number]   Weight:       129.7 lb Date of Birth:  1961-04-27     BSA:          1.61 m Patient Age:    15 years     BP:           151/65 mmHg Patient Gender: F            HR:           79 bpm. Exam Location:  Inpatient Procedure: 2D Echo Indications:    CAD. pre-op testing  History:        Patient has no prior history of Echocardiogram examinations.                 CAD; Risk Factors:Hypertension and Former Smoker.  Sonographer:    Jannett Celestine RDCS (AE) Referring Phys: [place]  1. Left ventricular ejection fraction, by visual estimation, is 60 to 65%. The left ventricle has normal function. There is mildly increased left ventricular hypertrophy.  2. Left ventricular diastolic parameters are consistent with Grade I diastolic dysfunction (impaired relaxation).  3. The left ventricle has no regional wall motion abnormalities.  4. Global right ventricle has normal systolic function.The right ventricular size is normal. No increase in right ventricular wall thickness.  5. Left atrial size was normal.  6. Right atrial size was normal.  7. The mitral valve is abnormal. Mild mitral valve regurgitation.  8. The tricuspid valve is grossly normal. Tricuspid valve regurgitation is trivial.  9. The aortic valve is tricuspid. Aortic valve regurgitation is not  visualized. 10. The pulmonic valve was grossly normal. Pulmonic valve regurgitation is not visualized. 11. The inferior vena cava is normal in size with <50% respiratory variability, suggesting right atrial pressure of 8 mmHg. FINDINGS  Left Ventricle: Left ventricular ejection fraction, by visual estimation, is 60 to 65%. The left ventricle has normal function. The left ventricle has no regional wall motion abnormalities. There is mildly increased left ventricular hypertrophy. Left ventricular diastolic parameters are consistent with Grade I diastolic dysfunction (impaired relaxation). Indeterminate filling pressures. Right Ventricle: The right ventricular size is normal. No increase in right ventricular wall thickness. Global RV systolic function is has normal systolic function. Left Atrium: Left atrial size was normal in size. Right Atrium: Right atrial size was normal in size  Pericardium: There is no evidence of pericardial effusion. Mitral Valve: The mitral valve is abnormal. There is mild thickening of the mitral valve leaflet(s). Mild mitral valve regurgitation. Tricuspid Valve: The tricuspid valve is grossly normal. Tricuspid valve regurgitation is trivial. Aortic Valve: The aortic valve is tricuspid. Aortic valve regurgitation is not visualized. Pulmonic Valve: The pulmonic valve was grossly normal. Pulmonic valve regurgitation is not visualized. Pulmonic regurgitation is not visualized. Aorta: The aortic root and ascending aorta are structurally normal, with no evidence of dilitation. Venous: The inferior vena cava is normal in size with less than 50% respiratory variability, suggesting right atrial pressure of 8 mmHg. IAS/Shunts: No atrial level shunt detected by color flow Doppler.  LEFT VENTRICLE PLAX 2D LVIDd:         3.80 cm  Diastology LVIDs:         2.50 cm  LV e' lateral:   11.30 cm/s LV PW:         1.20 cm  LV E/e' lateral: 8.6 LV IVS:        1.40 cm  LV e' medial:    7.29 cm/s LVOT diam:     1.90  cm  LV E/e' medial:  13.3 LV SV:         40 ml LV SV Index:   24.41 LVOT Area:     2.84 cm  RIGHT VENTRICLE RV S prime:     12.70 cm/s TAPSE (M-mode): 1.6 cm LEFT ATRIUM             Index       RIGHT ATRIUM           Index LA diam:        3.40 cm 2.11 cm/m  RA Area:     14.70 cm LA Vol (A2C):   38.3 ml 23.81 ml/m RA Volume:   34.80 ml  21.63 ml/m LA Vol (A4C):   33.1 ml 20.58 ml/m LA Biplane Vol: 35.8 ml 22.26 ml/m  AORTIC VALVE LVOT Vmax:   91.60 cm/s LVOT Vmean:  61.900 cm/s LVOT VTI:    0.222 m  AORTA Ao Root diam: 3.00 cm MITRAL VALVE MV Area (PHT): 2.84 cm             SHUNTS MV PHT:        77.43 msec           Systemic VTI:  0.22 m MV Decel Time: 267 msec             Systemic Diam: 1.90 cm MV E velocity: 96.80 cm/s 103 cm/s MV A velocity: 71.60 cm/s 70.3 cm/s MV E/A ratio:  1.35       1.5  Lyman Bishop MD Electronically signed by Lyman Bishop MD Signature Date/Time: 09/14/2019/4:20:57 PM    Final    ECHO INTRAOPERATIVE TEE  Result Date: 09/15/2019  *INTRAOPERATIVE TRANSESOPHAGEAL REPORT *  Patient Name:   Meghan Terry   Date of Exam: 09/15/2019 Medical Rec #:  [id number]      Height:       63.0 in Accession #:    [id number]     Weight:       129.8 lb Date of Birth:  12-24-1960       BSA:          1.61 m Patient Age:    58 years       BP:           130/61 mmHg Patient Gender: F  HR:           68 bpm. Exam Location:  Anesthesiology Transesophogeal exam was perform intraoperatively during surgical procedure. Patient was closely monitored under general anesthesia during the entirety of examination. Indications:     Coronary artery disease Performing Phys: Adele Barthel MD Diagnosing Phys: Adele Barthel MD Complications: No known complications during this procedure. POST-OP IMPRESSIONS Overall, there were no significant changes from pre-bypass. - Left Ventricle: The left ventricle is unchanged from pre-bypass. - Right Ventricle: The right ventricle appears unchanged from pre-bypass. - Aorta:  The aorta appears unchanged from pre-bypass. - Left Atrium: The left atrium appears unchanged from pre-bypass. - Left Atrial Appendage: The left atrial appendage appears unchanged from pre-bypass. - Aortic Valve: The aortic valve appears unchanged from pre-bypass. - Mitral Valve: The mitral valve appears unchanged from pre-bypass. - Tricuspid Valve: The tricuspid valve appears unchanged from pre-bypass. - Interatrial Septum: The interatrial septum appears unchanged from pre-bypass. - Interventricular Septum: The interventricular septum appears unchanged from pre-bypass. - Pericardium: The pericardium appears unchanged from pre-bypass. PRE-OP FINDINGS  Left Ventricle: The left ventricle has low normal systolic function, with an ejection fraction of 50-55%. The cavity size was normal. There is no increase in left ventricular wall thickness. Right Ventricle: The right ventricle has normal systolic function. The cavity was normal. There is no increase in right ventricular wall thickness. Left Atrium: Left atrial size was mildly dilated. Right Atrium: Right atrial size was normal in size. Interatrial Septum: No atrial level shunt detected by color flow Doppler. Pericardium: There is no evidence of pericardial effusion. Mitral Valve: The mitral valve is normal in structure. Mitral valve regurgitation is trivial by color flow Doppler. Tricuspid Valve: The tricuspid valve was normal in structure. Tricuspid valve regurgitation was not visualized by color flow Doppler. Aortic Valve: The aortic valve is normal in structure. Aortic valve regurgitation was not visualized by color flow Doppler. There is no evidence of aortic valve stenosis. Pulmonic Valve: The pulmonic valve was normal in structure. Pulmonic valve regurgitation is not visualized by color flow Doppler. Aorta: The aortic root, ascending aorta and aortic arch are normal in size and structure. There is evidence of plaque in the descending aorta.  Adele Barthel MD  Electronically signed by Adele Barthel MD Signature Date/Time: 09/15/2019/2:45:15 PM    Final    VAS US DOPPLER PRE CABG  Result Date: 09/14/2019 PREOPERATIVE VASCULAR EVALUATION  Indications:  Pre-CABG. Risk Factors: Hypertension, hyperlipidemia. Performing Technologist: June Leap RDMS, RVT  Examination Guidelines: A complete evaluation includes B-mode imaging, spectral Doppler, color Doppler, and power Doppler as needed of all accessible portions of each vessel. Bilateral testing is considered an integral part of a complete examination. Limited examinations for reoccurring indications may be performed as noted.  Right Carotid Findings: +----------+--------+--------+--------+------------+--------+           PSV cm/sEDV cm/sStenosisDescribe    Comments +----------+--------+--------+--------+------------+--------+ CCA Prox  112     20                                   +----------+--------+--------+--------+------------+--------+ CCA Distal73      26                                   +----------+--------+--------+--------+------------+--------+ ICA Prox  126     29      1-39%  heterogenous         +----------+--------+--------+--------+------------+--------+ ICA Distal100     30                                   +----------+--------+--------+--------+------------+--------+ ECA       110     12                                   +----------+--------+--------+--------+------------+--------+ Portions of this table do not appear on this page. +----------+--------+-------+----------------+------------+           PSV cm/sEDV cmsDescribe        Arm Pressure +----------+--------+-------+----------------+------------+ Subclavian204            Multiphasic, WNL             +----------+--------+-------+----------------+------------+ +---------+--------+--+--------+--+---------+ VertebralPSV cm/s68EDV cm/s13Antegrade +---------+--------+--+--------+--+---------+  Left Carotid Findings: +----------+--------+--------+--------+------------+--------+           PSV cm/sEDV cm/sStenosisDescribe    Comments +----------+--------+--------+--------+------------+--------+ CCA Prox  99      21                                   +----------+--------+--------+--------+------------+--------+ CCA Distal75      17                                   +----------+--------+--------+--------+------------+--------+ ICA Prox  171     57      40-59%  heterogenous         +----------+--------+--------+--------+------------+--------+ ICA Mid   135     34                                   +----------+--------+--------+--------+------------+--------+ ICA Distal111     17                                   +----------+--------+--------+--------+------------+--------+ ECA       115     13                                   +----------+--------+--------+--------+------------+--------+ +----------+--------+--------+----------------+------------+ SubclavianPSV cm/sEDV cm/sDescribe        Arm Pressure +----------+--------+--------+----------------+------------+           217             Multiphasic, [id number]          +----------+--------+--------+----------------+------------+ +---------+--------+---+--------+--+---------+ VertebralPSV cm/s107EDV cm/s25Antegrade +---------+--------+---+--------+--+---------+  Right Doppler Findings: +--------+--------+-----+---------+---------------------------+ Site    PressureIndexDoppler  Comments                    +--------+--------+-----+---------+---------------------------+ Brachial             triphasicnot obtained due to TR band +--------+--------+-----+---------+---------------------------+ Radial               triphasic                            +--------+--------+-----+---------+---------------------------+ Ulnar  triphasic                             +--------+--------+-----+---------+---------------------------+  Left Doppler Findings: +--------+--------+-----+---------+--------+ Site    PressureIndexDoppler  Comments +--------+--------+-----+---------+--------+ [id number]          triphasic         +--------+--------+-----+---------+--------+ Radial               triphasic         +--------+--------+-----+---------+--------+ Ulnar                triphasic         +--------+--------+-----+---------+--------+  Summary: Right Carotid: Velocities in the right ICA are consistent with a 1-39% stenosis. Left Carotid: Velocities in the left ICA are consistent with a 40-59% stenosis. ABI: Pedal artery waveforms within normal limits. Right Upper Extremity: Not obtained due to TR band in place. Left Upper Extremity: Doppler waveforms remain within normal limits with left radial compression. Doppler waveforms remain within normal limits with left ulnar compression.  Electronically signed by Servando Snare MD on 09/14/2019 at 3:36:23 PM.    Final     Cardiac Studies   Cath, echo personally reviewed from 09/14/19  Patient Profile     58 y.o. female with significant CV risk factors presented to Dr. Geraldo Pitter with concerns for severe anginal symptoms. 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 Cyndia Bent 09/15/19.  Assessment & Plan    CAD s/p CABG 09/15/19 by Dr. Cyndia Bent (3V, LIMA-LAD, SVG-OM, SVG-PDA) -POD 1 today, doing very well. Off all IV pressor support. BP good.  -post op anemia as expected. Question H/H results last evening, but otherwise stable. No post op transfusions required/documented (620 ml blood during op from cell saver) -chest tube output being monitored, within expected range -making good urine (over 1 ml/kg/hr) -no significant arrhythmias -on aspirin 325 per CT surgery CABG protocol -on metoprolol 12.5 mg BID and tolerating -will check LFTs tomorrow AM, if acceptable will start high intensity statin.  Transaminases nl prior to surgery. -preop EF normal  CV risk factors: Type II diabetes: on insulin drip, weaning down. Preop A1c 5.8. Was on metformin at home. Would consider SGLT2i given her severe CAD. Hypertension: well controlled currently. Started metoprolol as above, home dose was 25 mg BID. Was on losartan 50 mg at home. Former smoker Family history of CAD I cannot see that she has lipids in our system. Difficult to assess in immediate post op period. Will check prior to discharge. Will need high intensity statin given severe CAD but would be helpful to have a start point.  CRITICAL CARE Patient requires monitoring in critical care unit, given postoperative state, invasive hemodynamic monitoring, high complexity decision making. Total critical care time: 35 minutes. This time includes gathering of history, evaluation of patient's response to treatment, examination of patient, review of laboratory and imaging studies, and coordination with consultants.   For questions or updates, please contact [place] Please consult [email or web address] for contact info under     Signed, Buford Dresser, MD  09/16/2019, 8:49 AM

## 2019-09-16 NOTE — Progress Notes (Signed)
5 mg of lopressor given for bp of 131/59

## 2019-09-17 ENCOUNTER — Inpatient Hospital Stay (HOSPITAL_COMMUNITY): Payer: Commercial Managed Care - PPO

## 2019-09-17 LAB — CBC
HCT: 28.6 % — ABNORMAL LOW (ref 36.0–46.0)
Hemoglobin: 9.4 g/dL — ABNORMAL LOW (ref 12.0–15.0)
MCH: 30.1 pg (ref 26.0–34.0)
MCHC: 32.9 g/dL (ref 30.0–36.0)
MCV: 91.7 fL (ref 80.0–100.0)
Platelets: 148 10*3/uL — ABNORMAL LOW (ref 150–400)
RBC: 3.12 MIL/uL — ABNORMAL LOW (ref 3.87–5.11)
RDW: 13.8 % (ref 11.5–15.5)
WBC: 11.1 10*3/uL — ABNORMAL HIGH (ref 4.0–10.5)
nRBC: 0 % (ref 0.0–0.2)

## 2019-09-17 LAB — BASIC METABOLIC PANEL
Anion gap: 6 (ref 5–15)
BUN: 17 mg/dL (ref 6–20)
CO2: 24 mmol/L (ref 22–32)
Calcium: 8.3 mg/dL — ABNORMAL LOW (ref 8.9–10.3)
Chloride: 109 mmol/L (ref 98–111)
Creatinine, Ser: 1.31 mg/dL — ABNORMAL HIGH (ref 0.44–1.00)
GFR calc Af Amer: 51 mL/min — ABNORMAL LOW (ref >60)
GFR calc non Af Amer: 44 mL/min — ABNORMAL LOW (ref >60)
Glucose, Bld: 126 mg/dL — ABNORMAL HIGH (ref 70–99)
Potassium: 3.8 mmol/L (ref 3.5–5.1)
Sodium: 139 mmol/L (ref 135–145)

## 2019-09-17 LAB — GLUCOSE, CAPILLARY
Glucose-Capillary: 112 mg/dL — ABNORMAL HIGH (ref 70–99)
Glucose-Capillary: 133 mg/dL — ABNORMAL HIGH (ref 70–99)
Glucose-Capillary: 130 mg/dL — ABNORMAL HIGH (ref 70–99)

## 2019-09-17 LAB — HEPATIC FUNCTION PANEL
ALT: 14 U/L (ref 0–44)
AST: 17 U/L (ref 15–41)
Albumin: 2.9 g/dL — ABNORMAL LOW (ref 3.5–5.0)
Alkaline Phosphatase: 41 U/L (ref 38–126)
Bilirubin, Direct: <0.1 mg/dL (ref 0.0–0.2)
Total Bilirubin: 0.5 mg/dL (ref 0.3–1.2)
Total Protein: 4.8 g/dL — ABNORMAL LOW (ref 6.5–8.1)

## 2019-09-17 MED ORDER — ROSUVASTATIN CALCIUM 20 MG PO TABS
20.0000 mg | ORAL_TABLET | Freq: Every day | ORAL | Status: DC
  Administered 2019-09-17 – 2019-09-18 (×2): 20 mg via ORAL
  Filled 2019-09-17 (×2): qty 1

## 2019-09-17 MED ORDER — SODIUM CHLORIDE 0.9 % IV SOLN: 250.0000 mL | INTRAVENOUS | Status: DC | PRN

## 2019-09-17 MED ORDER — METOPROLOL TARTRATE 25 MG PO TABS
25.0000 mg | ORAL_TABLET | Freq: Two times a day (BID) | ORAL | Status: DC
  Administered 2019-09-17 – 2019-09-19 (×4): 25 mg via ORAL
  Filled 2019-09-17 (×4): qty 1

## 2019-09-17 MED ORDER — MAGNESIUM HYDROXIDE 400 MG/5ML PO SUSP: 30.0000 mL | Freq: Every day | ORAL | Status: DC | PRN

## 2019-09-17 MED ORDER — SODIUM CHLORIDE 0.9% FLUSH: 3.0000 mL | INTRAVENOUS | Status: DC | PRN

## 2019-09-17 MED ORDER — METOPROLOL TARTRATE 25 MG/10 ML ORAL SUSPENSION
12.5000 mg | Freq: Two times a day (BID) | ORAL | Status: DC
  Filled 2019-09-17 (×5): qty 5

## 2019-09-17 MED ORDER — ~~LOC~~ CARDIAC SURGERY, PATIENT & FAMILY EDUCATION: Freq: Once | Status: DC

## 2019-09-17 MED ORDER — SODIUM CHLORIDE 0.9% FLUSH
3.0000 mL | Freq: Two times a day (BID) | INTRAVENOUS | Status: DC
  Administered 2019-09-17 – 2019-09-19 (×3): 3 mL via INTRAVENOUS

## 2019-09-17 MED ORDER — FUROSEMIDE 40 MG PO TABS
40.0000 mg | ORAL_TABLET | Freq: Every day | ORAL | Status: DC
  Administered 2019-09-18 – 2019-09-19 (×2): 40 mg via ORAL
  Filled 2019-09-17 (×2): qty 1

## 2019-09-17 NOTE — Progress Notes (Signed)
2 Days Post-Op Procedure(s) (LRB): CORONARY ARTERY BYPASS GRAFTING (CABG) times three on pump using left internal mammary artery and right and left greater saphenous veins harvested endoscopically (N/A) TRANSESOPHAGEAL ECHOCARDIOGRAM (TEE) (N/A) Subjective: Cont to do well Ready for tx to floor Objective: Vital signs in last 24 hours: Temp:  [98 F (36.7 C)-98.9 F (37.2 C)] 98.9 F (37.2 C) (12/20 0726) Pulse Rate:  [70-90] 75 (12/20 1200) Cardiac Rhythm: Normal sinus rhythm (12/20 1200) Resp:  [6-25] 18 (12/20 1200) BP: (88-141)/(30-66) 118/63 (12/20 1200) SpO2:  [92 %-98 %] 92 % (12/20 1200) Arterial Line BP: (124-176)/(41-67) 176/67 (12/19 1500) Weight:  [64.3 kg] 64.3 kg (12/20 0500)  Hemodynamic parameters for last 24 hours: PAP: (24-43)/(5-24) 43/24  Intake/Output from previous day: 12/19 0701 - 12/20 0700 In: 1069.8 [P.O.:480; I.V.:239.8; IV Piggyback:350] Out: 1640 [Urine:1540; Chest Tube:100] Intake/Output this shift: Total I/O In: 480 [P.O.:480] Out: 600 [Urine:600]       Exam    General- alert and comfortable    Neck- no JVD, no cervical adenopathy palpable, no carotid bruit   Lungs- clear without rales, wheezes   Cor- regular rate and rhythm, no murmur , gallop   Abdomen- soft, non-tender   Extremities - warm, non-tender, minimal edema   Neuro- oriented, appropriate, no focal weakness   Lab Results: Recent Labs    09/16/19 1703 09/17/19 0358  WBC 13.4* 11.1*  HGB 10.2* 9.4*  HCT 30.3* 28.6*  PLT 166 148*   BMET:  Recent Labs    09/16/19 1703 09/17/19 0358  NA 139 139  K 3.9 3.8  CL 109 109  CO2 22 24  GLUCOSE 140* 126*  BUN 17 17  CREATININE 1.37* 1.31*  CALCIUM 8.3* 8.3*    PT/INR:  Recent Labs    09/15/19 1256  LABPROT 16.1*  INR 1.3*   ABG    Component Value Date/Time   PHART 7.335 (L) 09/15/2019 2159   HCO3 23.9 09/15/2019 2159   TCO2 25 09/15/2019 2159   ACIDBASEDEF 2.0 09/15/2019 2159   O2SAT 99.0 09/15/2019 2159    CBG (last 3)  Recent Labs    09/17/19 0340 09/17/19 0723 09/17/19 1127  GLUCAP 112* 133* 130*    Assessment/Plan: S/P Procedure(s) (LRB): CORONARY ARTERY BYPASS GRAFTING (CABG) times three on pump using left internal mammary artery and right and left greater saphenous veins harvested endoscopically (N/A) TRANSESOPHAGEAL ECHOCARDIOGRAM (TEE) (N/A) Mobilize Diuresis Plan for transfer to step-down: see transfer orders   LOS: 3 days    Tharon Aquas Trigt III 09/17/2019

## 2019-09-17 NOTE — Progress Notes (Signed)
Pt transferred to 4E-21 via wheelchair from [facility]. Pt walked to bed. Pt given CHG bath. Tele applied, CCMD notified. Pt oriented to call bell, bed and room. Call bell within reach. VSS. Will continue to monitor.  Amanda Cockayne, RN

## 2019-09-17 NOTE — Progress Notes (Signed)
Progress Note  Patient Name: Meghan Terry Date of Encounter: 09/17/2019  Primary Cardiologist: Revankar  Subjective   Doing well today. Has been up in chair or ambulating hallway since 5:30 AM. Tired but tolerated. Chest tubes are out. She is making good urine. Pain is adequately controlled. Reviewed medications with her today, see below.  Inpatient Medications    Scheduled Meds: . acetaminophen  1,000 mg Oral Q6H   Or  . acetaminophen (TYLENOL) oral liquid 160 mg/5 mL  1,000 mg Per Tube Q6H  . acetaminophen (TYLENOL) oral liquid 160 mg/5 mL  650 mg Per Tube Once   Or  . acetaminophen  650 mg Rectal Once  . aspirin EC  325 mg Oral Daily   Or  . aspirin  324 mg Per Tube Daily  . bisacodyl  10 mg Oral Daily   Or  . bisacodyl  10 mg Rectal Daily  . buPROPion  300 mg Oral Daily  . Chlorhexidine Gluconate Cloth  6 each Topical Daily  . docusate sodium  200 mg Oral Daily  . furosemide  20 mg Intravenous BID  . insulin aspart  0-24 Units Subcutaneous Q4H  . levothyroxine  75 mcg Oral QAC breakfast  . metoprolol tartrate  12.5 mg Oral BID   Or  . metoprolol tartrate  12.5 mg Per Tube BID  . mupirocin ointment  1 application Nasal BID  . pantoprazole  40 mg Oral Daily  . rOPINIRole  1 mg Oral TID WC  . rOPINIRole  4 mg Oral QHS  . rosuvastatin  20 mg Oral q1800  . sodium chloride flush  3 mL Intravenous Q12H  . topiramate  100 mg Oral BID  . traZODone  150 mg Oral QHS   Continuous Infusions: . sodium chloride    . sodium chloride    . sodium chloride 10 mL/hr at 09/15/19 2100  . lactated ringers Stopped (09/16/19 1622)   PRN Meds: sodium chloride, hydrALAZINE, metoprolol tartrate, ondansetron (ZOFRAN) IV, oxyCODONE, sodium chloride flush, traMADol   Vital Signs    Vitals:   09/17/19 0726 09/17/19 0800 09/17/19 0900 09/17/19 0930  BP:  (!) 117/54 (!) 132/57 (!) 132/57  Pulse:  87 89 85  Resp:  (!) 21 20   Temp: 98.9 F (37.2 C)     TempSrc: Oral     SpO2:  95%  95%   Weight:      Height:        Intake/Output Summary (Last 24 hours) at 09/17/2019 0935 Last data filed at 09/17/2019 0900 Gross per 24 hour  Intake 959.83 ml  Output 2240 ml  Net -1280.17 ml   Last 3 Weights 09/17/2019 09/16/2019 09/15/2019  Weight (lbs) 141 lb 12.1 oz 142 lb 6.7 oz 129 lb 12.8 oz  Weight (kg) 64.3 kg 64.6 kg 58.877 kg  Some encounter information is confidential and restricted. Go to Review Flowsheets activity to see all data.      Telemetry    NSR - Personally Reviewed  ECG    09/16/19 NSR - Personally Reviewed  Physical Exam   GEN: Well nourished, well developed in no acute distress HEENT: Normal, moist mucous membranes NECK: No JVD, introducer in R IJ CARDIAC: regular rhythm, normal S1 and S2, no rubs or gallops. No murmur. VASCULAR: Radial pulses 2+ bilaterally. No carotid bruits RESPIRATORY:  Clear to auscultation without rales, wheezing or rhonchi. Mildly diminished bilateral bases ABDOMEN: Soft, non-tender, non-distended MUSCULOSKELETAL:  Ambulates independently SKIN: Warm and dry,  trace bilateral LE edema NEUROLOGIC:  Alert and oriented x 3. No focal neuro deficits noted. PSYCHIATRIC:  Normal affect    Labs    High Sensitivity Troponin:  No results for input(s): TROPONINIHS in the last 720 hours.    Chemistry Recent Labs  Lab 09/14/19 2134 09/16/19 0429 09/16/19 1703 09/17/19 0358  NA 137 140 139 139  K 3.8 3.7 3.9 3.8  CL 112* 110 109 109  CO2 19* 24 22 24   GLUCOSE 134* 127* 140* 126*  BUN 15 14 17 17   CREATININE 1.35* 1.10* 1.37* 1.31*  CALCIUM 8.1* 7.9* 8.3* 8.3*  PROT 5.2*  --   --  4.8*  ALBUMIN 3.0*  --   --  2.9*  AST 14*  --   --  17  ALT 12  --   --  14  ALKPHOS 63  --   --  41  BILITOT 0.6  --   --  0.5  GFRNONAA 43* 55* 42* 44*  GFRAA 49* >60 48* 51*  ANIONGAP 6 6 8 6      Hematology Recent Labs  Lab 09/16/19 0429 09/16/19 1703 09/17/19 0358  WBC 10.4 13.4* 11.1*  RBC 3.11* 3.44* 3.12*  HGB 9.4* 10.2*  9.4*  HCT 27.7* 30.3* 28.6*  MCV 89.1 88.1 91.7  MCH 30.2 29.7 30.1  MCHC 33.9 33.7 32.9  RDW 13.6 13.6 13.8  PLT 146* 166 148*    BNPNo results for input(s): BNP, PROBNP in the last 168 hours.   DDimer No results for input(s): DDIMER in the last 168 hours.   Radiology    DG Chest Port 1 View  Result Date: 09/17/2019 CLINICAL DATA:  History of CABG EXAM: PORTABLE CHEST 1 VIEW COMPARISON:  Yesterday FINDINGS: Swan-Ganz catheter and chest tubes have been removed. Low volume chest with mild atelectasis and trace pleural fluid. Improved aeration on the left where the diaphragm is better seen. No pneumothorax. Stable heart size. IMPRESSION: 1. Chest tube removal with no visible pneumothorax. 2. Atelectasis with mild improvement. Electronically Signed   By: Monte Fantasia M.D.   On: 09/17/2019 08:43   DG Chest Port 1 View  Result Date: 09/16/2019 CLINICAL DATA:  CABG EXAM: PORTABLE CHEST 1 VIEW COMPARISON:  Yesterday FINDINGS: Tracheal and esophageal extubation. Swan-Ganz catheter from the right with tip the main pulmonary artery level. Chest drains in stable position. Lower lung volumes with increased hazy opacity at the bases. No pneumothorax or pulmonary edema IMPRESSION: 1. Extubation with lower lung volumes. Mild increase in basal atelectasis and small pleural fluid. 2. No visible pneumothorax. Electronically Signed   By: Monte Fantasia M.D.   On: 09/16/2019 08:23   DG Chest Port 1 View  Result Date: 09/15/2019 CLINICAL DATA:  Post CABG EXAM: PORTABLE CHEST 1 VIEW COMPARISON:  09/14/2019 FINDINGS: Endotracheal tube is approximately 4 cm above the carina. Right IJ Swan-Ganz catheter tip overlies region of the main pulmonary artery bifurcation. Enteric tube passes into the stomach. Left apically directed chest tube. A mediastinal drain is present. No pneumothorax. Lungs are clear. No significant pleural effusion. Stable cardiomediastinal contours with normal heart size and evidence of  interval CABG. IMPRESSION: Lines and tubes as above.  No pneumothorax. Electronically Signed   By: Macy Mis M.D.   On: 09/15/2019 13:21    Cardiac Studies   Cath, echo personally reviewed from 09/14/19  Patient Profile     58 y.o. female with significant CV risk factors presented to Dr. Geraldo Pitter with concerns for  severe anginal symptoms. 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 Cyndia Bent 09/15/19.  Assessment & Plan    CAD s/p CABG 09/15/19 by Dr. Cyndia Bent (3V, LIMA-LAD, SVG-OM, SVG-PDA) -POD 2 today, doing very well. Off all IV pressor support, all chest tubes out. BP stable, one brief dip overnight -post op anemia as expected, fluctuating somewhat but average is stable. No post op transfusions required/documented (620 ml blood during op from cell saver) -mild LE edema, agree with gentle diuresis. She is up from her admission weight, would use this to monitor. -no significant arrhythmias -on aspirin 325 per CT surgery CABG protocol -on metoprolol 12.5 mg BID and tolerating -LFTs normal, starting statin today. Now that she is tolerating diet, check lipids tomorrow AM for baseline (none in our system) -preop EF normal -we discussed medications today. She initially thought she was on crestor at home, but had it confused with losartan. Does not think she has ever been on a statin. A1c excellent, initially placed on metformin when she had no insurance, now has insurance.  CV risk factors: Type II diabetes: off drip, now with SSI. Preop A1c 5.8. Was on metformin at home. Would consider SGLT2i given her severe CAD at discharge instead of metformin. Hypertension: well controlled currently. Started metoprolol as above, home dose was 25 mg BID. Was on losartan 50 mg at home, but BP too soft to add at this time. Former smoker Family history of CAD Suspect hyperlipidemia, no lipids in our system, starting statin tonight and checking lipids tomorrow AM.  Doing very well with  recovery. Suspect she will transfer to the floor soon. Ok from my perspective to remove foley catheter and R IJ introducer when ok by primary team.  For questions or updates, please contact [place] Please consult [email or web address] for contact info under     Signed, Buford Dresser, MD  09/17/2019, 9:35 AM

## 2019-09-18 ENCOUNTER — Inpatient Hospital Stay (HOSPITAL_COMMUNITY): Payer: Commercial Managed Care - PPO

## 2019-09-18 ENCOUNTER — Encounter: Payer: Self-pay | Admitting: *Deleted

## 2019-09-18 DIAGNOSIS — Z951 Presence of aortocoronary bypass graft: Secondary | ICD-10-CM

## 2019-09-18 HISTORY — DX: Presence of aortocoronary bypass graft: Z95.1

## 2019-09-18 LAB — BASIC METABOLIC PANEL
Anion gap: 7 (ref 5–15)
BUN: 19 mg/dL (ref 6–20)
CO2: 24 mmol/L (ref 22–32)
Calcium: 8.2 mg/dL — ABNORMAL LOW (ref 8.9–10.3)
Chloride: 110 mmol/L (ref 98–111)
Creatinine, Ser: 1.32 mg/dL — ABNORMAL HIGH (ref 0.44–1.00)
GFR calc Af Amer: 51 mL/min — ABNORMAL LOW (ref >60)
GFR calc non Af Amer: 44 mL/min — ABNORMAL LOW (ref >60)
Glucose, Bld: 110 mg/dL — ABNORMAL HIGH (ref 70–99)
Potassium: 3.9 mmol/L (ref 3.5–5.1)
Sodium: 141 mmol/L (ref 135–145)

## 2019-09-18 LAB — CBC
HCT: 27.3 % — ABNORMAL LOW (ref 36.0–46.0)
Hemoglobin: 8.7 g/dL — ABNORMAL LOW (ref 12.0–15.0)
MCH: 29.4 pg (ref 26.0–34.0)
MCHC: 31.9 g/dL (ref 30.0–36.0)
MCV: 92.2 fL (ref 80.0–100.0)
Platelets: 151 10*3/uL (ref 150–400)
RBC: 2.96 MIL/uL — ABNORMAL LOW (ref 3.87–5.11)
RDW: 13.5 % (ref 11.5–15.5)
WBC: 9.9 10*3/uL (ref 4.0–10.5)
nRBC: 0 % (ref 0.0–0.2)

## 2019-09-18 LAB — BPAM RBC
Blood Product Expiration Date: 202101172359
Blood Product Expiration Date: 202101172359
Blood Product Expiration Date: 202101172359
Blood Product Expiration Date: 202101172359
ISSUE DATE / TIME: 202012181005
ISSUE DATE / TIME: 202012181005
Unit Type and Rh: 6200
Unit Type and Rh: 6200
Unit Type and Rh: 6200
Unit Type and Rh: 6200

## 2019-09-18 LAB — TYPE AND SCREEN
ABO/RH(D): A POS
Antibody Screen: NEGATIVE
Unit division: 0
Unit division: 0
Unit division: 0
Unit division: 0

## 2019-09-18 LAB — LIPID PANEL
Cholesterol: 97 mg/dL (ref 0–200)
HDL: 29 mg/dL — ABNORMAL LOW (ref >40)
LDL Cholesterol: 44 mg/dL (ref 0–99)
Total CHOL/HDL Ratio: 3.3 RATIO
Triglycerides: 118 mg/dL (ref <150)
VLDL: 24 mg/dL (ref 0–40)

## 2019-09-18 MED ORDER — LOSARTAN POTASSIUM 25 MG PO TABS
25.0000 mg | ORAL_TABLET | Freq: Every day | ORAL | Status: DC
  Administered 2019-09-18 – 2019-09-19 (×2): 25 mg via ORAL
  Filled 2019-09-18 (×2): qty 1

## 2019-09-18 MED ORDER — POTASSIUM CHLORIDE CRYS ER 20 MEQ PO TBCR
20.0000 meq | EXTENDED_RELEASE_TABLET | Freq: Two times a day (BID) | ORAL | Status: AC
  Administered 2019-09-18 (×2): 20 meq via ORAL
  Filled 2019-09-18 (×2): qty 1

## 2019-09-18 MED ORDER — METOLAZONE 5 MG PO TABS
2.5000 mg | ORAL_TABLET | Freq: Once | ORAL | Status: AC
  Administered 2019-09-18: 2.5 mg via ORAL
  Filled 2019-09-18: qty 1

## 2019-09-18 MED FILL — Thrombin (Recombinant) For Soln 20000 Unit: CUTANEOUS | Qty: 1 | Status: AC

## 2019-09-18 NOTE — Progress Notes (Addendum)
[facility] will sign off.  Plan to discharge tomorrow.  Medication Recommendations:  See yesterday's note for detailed recommendations  Other recommendations (labs, testing, etc):  N/A Follow up as an outpatient:  Has been arranged with Dr. Geraldo Pitter on 10/03/2019

## 2019-09-18 NOTE — Progress Notes (Addendum)
      [address]       [place] [address]             [phone]      3 Days Post-Op Procedure(s) (LRB): CORONARY ARTERY BYPASS GRAFTING (CABG) times three on pump using left internal mammary artery and right and left greater saphenous veins harvested endoscopically (N/A) TRANSESOPHAGEAL ECHOCARDIOGRAM (TEE) (N/A)   Subjective:  Doing okay.  States pain well controlled.  Denies shortness of breath N/V.  + ambulation  + BM  Objective: Vital signs in last 24 hours: Temp:  [97.6 F (36.4 C)-97.9 F (36.6 C)] 97.7 F (36.5 C) (12/21 0416) Pulse Rate:  [71-89] 74 (12/21 0500) Cardiac Rhythm: Normal sinus rhythm (12/21 0416) Resp:  [16-25] 19 (12/21 0500) BP: (95-163)/(54-74) 154/74 (12/21 0416) SpO2:  [90 %-99 %] 90 % (12/21 0500) Weight:  [62.2 kg] 62.2 kg (12/21 0500)  Intake/Output from previous day: 12/20 0701 - 12/21 0700 In: 657.2 [P.O.:480; I.V.:177.2] Out: 600 [Urine:600]  General appearance: alert, cooperative and no distress Heart: regular rate and rhythm Lungs: diminished breath sounds bibasilar Abdomen: soft, non-tender; bowel sounds normal; no masses,  no organomegaly Extremities: edema trace Wound: clean and dry  Lab Results: Recent Labs    09/17/19 0358 09/18/19 0355  WBC 11.1* 9.9  HGB 9.4* 8.7*  HCT 28.6* 27.3*  PLT 148* 151   BMET:  Recent Labs    09/17/19 0358 09/18/19 0355  NA 139 141  K 3.8 3.9  CL 109 110  CO2 24 24  GLUCOSE 126* 110*  BUN 17 19  CREATININE 1.31* 1.32*  CALCIUM 8.3* 8.2*    PT/INR:  Recent Labs    09/15/19 1256  LABPROT 16.1*  INR 1.3*   ABG    Component Value Date/Time   PHART 7.335 (L) 09/15/2019 2159   HCO3 23.9 09/15/2019 2159   TCO2 25 09/15/2019 2159   ACIDBASEDEF 2.0 09/15/2019 2159   O2SAT 99.0 09/15/2019 2159   CBG (last 3)  Recent Labs    09/17/19 0340 09/17/19 0723 09/17/19 1127  GLUCAP 112* 133* 130*    Assessment/Plan: S/P Procedure(s) (LRB): CORONARY ARTERY  BYPASS GRAFTING (CABG) times three on pump using left internal mammary artery and right and left greater saphenous veins harvested endoscopically (N/A) TRANSESOPHAGEAL ECHOCARDIOGRAM (TEE) (N/A)  1. CV- NSR, BP is elevated- continue Lopressor, start Cozaar at reduced dose, will start Plavix tomorrow 2. Pulm- off oxygen, CXR with bilateral small pleural effusions, continue IS 3. Renal- creatinine is stable, weight is trending down, continue Lasix, potassium 4. Expected post operative blood loss anemia Hgb 8.7 5. CBGs controlled, previous diabetic per patient on Metformin prior to admission, A1c is 5.8 will resume at discharge 6. Dispo- patient stable, maintaining NSR, will start home Cozaar at reduced dose for better BP control, d/c EPW today, if remains stable, possibly for d/c tomorrow   LOS: 4 days   Ellwood Handler, PA-C  09/18/2019   Chart reviewed, patient examined, agree with above. She is still 7 lbs over preop so will continue diuresis. Possibly home tomorrow if she looks good. Will send home on ASA and Plavix with her diffuse CAD.

## 2019-09-18 NOTE — Telephone Encounter (Signed)
-----   Message from Jenean Lindau, MD sent at 09/06/2019  8:15 AM EST ----- Let him know about the results.  Patient has aortic atherosclerosis. ----- Message ----- From: [name], MD Sent: 09/06/2019  12:36 AM EST To: Jenean Lindau, MD

## 2019-09-18 NOTE — Progress Notes (Signed)
Patient ambulated in hallway with nursing staff. Fatoumata Albaugh, Bettina Gavia RN

## 2019-09-18 NOTE — Telephone Encounter (Signed)
Results relayed, no further questions. 

## 2019-09-18 NOTE — Discharge Summary (Addendum)
Physician Discharge Summary  Patient ID: Meghan Terry MRN: [id number] DOB/AGE: [birth year] 58 y.o.  Admit date: 09/14/2019 Discharge date: 09/19/2019  Admission Diagnoses:  Patient Active Problem List   Diagnosis Date Noted  . 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 Coronary artery disease involving native coronary artery of native heart with unstable angina pectoris ([place]) 09/14/2019  . Angina pectoris ([place]) 09/05/2019  . Essential hypertension 09/05/2019  . Diabetes mellitus due to underlying condition with unspecified complications ([place]) [id number]  . Ex-smoker 09/05/2019  . Family history of coronary artery disease 09/05/2019  . Migraine 04/25/2017  . 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 Hypothyroid 04/25/2017  . Depression 04/25/2017  . Pyelonephritis 05/20/2016   Discharge Diagnoses:   Patient Active Problem List   Diagnosis Date Noted  . 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 Coronary artery disease involving native coronary artery of native heart with unstable angina pectoris ([place]) 09/14/2019  . Angina pectoris ([place]) 09/05/2019  . Essential hypertension 09/05/2019  . Diabetes mellitus due to underlying condition with unspecified complications ([place]) [id number]  . Ex-smoker 09/05/2019  . Family history of coronary artery disease 09/05/2019  . Migraine 04/25/2017  . 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 Hypothyroid 04/25/2017  . Depression 04/25/2017  . Pyelonephritis 05/20/2016   Discharged Condition: good  History of Present Illness:   Meghan Terry is a 58 yo white female with known history of HTN, Dyslipidemia, DM, and history of significant nicotine abuse.  She previously smoked 1 ppd day for 40 years, but has been vaping for the last 4.  She notes a strong family history of CAD with a father who passed away in his 21s.  The patient presented to her PCP with complaints of crushing chest pain that radiates to her neck and down her arm.  He subsequently referred her to Dr. Julianne Rice who felt coronary  angiography should be performed.  This was performed today 09/14/2019 by Dr. Irish Lack and showed preserved EF but severe 3 vessel CAD.  It was felt coronary bypass grafting would be indicated and TCTS consult was requested.  Hospital Course:   The patient had some mild chest discomfort post catheterization.  This resolved on its own.  She states that she has been experiencing chest pain over the past several weeks.  This can occur with exertion and rest.  She states the pain resolves with rest and time.  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  She can do what she needs to do without limitation normally.  She states that her diabetes is much improved with last A1c being 5.1 but her doctor keeps her on low dose Metformin to prevent worsening control.  She was evaluated by Dr. Cyndia Bent who felt the patient would be a candidate for coronary bypass grafting.  The risks and benefits of the procedure were explained to the patient and she was agreeable to proceed.  She was taken to the operating room on 09/15/2019.  She underwent CABG x 3 utilizing LIMA to LAD, SVG to OM, and SVG to PDA.  She also underwent endoscopic harvest of greater saphenous vein from her right leg and left thigh.  She tolerated the procedure without difficulty and was taken to the SICU in stable condition.  She was extubated the evening of surgery.  During her stay in the SICU the patient's chest tubes and arterial lines were removed without difficulty.  She was started on lasix for mild volume overload.  Her creatinine on 12/22 was stable at 1.35 (her admission creatinine was 1.43). She was ambulating without difficulty.  She was maintaining NSR.  She was felt stable for transfer to the telemetry unit on 09/17/2019.  The patient continues to make good progress.  She remains in NSR.  Her pacing wires were removed without difficulty.  She was hypertensive and was restarted on home Cozaar at a reduced  dose.  She will be started on Plavix prior to discharge.  She continues to ambulate independently.  Her incisions are healing without evidence of infection.  As discussed with Dr. Cyndia Bent, she is medically stable for discharge home today.    Significant Diagnostic Studies: angiography:    Mid RCA lesion is 75% stenosed.  Dist RCA lesion is 70% stenosed.  RPAV lesion is 75% stenosed.  1st Mrg lesion is 90% stenosed.  Prox Cx to Mid Cx lesion is 99% stenosed.  Ost LAD to Prox LAD lesion is 80% stenosed.  Mid LAD lesion is 50% stenosed.  Dist LAD lesion is 50% stenosed.  The left ventricular systolic function is normal.  LV end diastolic pressure is normal.  The left ventricular ejection fraction is 55-65% by visual estimate.  There is no aortic valve stenosis.  Treatments: surgery:   1. Median Sternotomy 2. Extracorporeal circulation 3.   Coronary artery bypass grafting x 3   Left internal mammary graft to the LAD  SVG to OM  SVG to PDA  4.   Endoscopic vein harvest from the right and left legs   Discharge Exam: Blood pressure 124/81, pulse 96, temperature 98.3 F (36.8 C), temperature source Oral, resp. rate 18, height 5\' 3"  (1.6 m), weight 59.7 kg, SpO2 97 %.   Cardiovascular: RRR Pulmonary: Slightly diminished bibasilar breath sounds Abdomen: Soft, non tender, bowel sounds present. Extremities: Mild bilateral lower extremity edema. Wounds: Clean and dry.  No erythema or signs of infection.  Discharge medications. Discharge Instructions    Amb Referral to Cardiac Rehabilitation   Complete by: As directed    Will send Cardiac Rehab Phase 2 referral to [facility]   Diagnosis: CABG   CABG X ___: 3   After initial evaluation and assessments completed: Virtual Based Care may be provided alone or in conjunction with Phase 2 Cardiac Rehab based on patient barriers.: Yes     Allergies as of 09/19/2019      Reactions   Carbidopa-levodopa Other (See Comments)    Developed tics while taking   Prednisone Other (See Comments)   Turns red all over   Venlafaxine Other (See Comments)   Developed tics while taking      Medication List    STOP taking these medications   metFORMIN 500 MG tablet Commonly known as: GLUCOPHAGE   nitroGLYCERIN 0.4 MG SL tablet Commonly known as: NITROSTAT     TAKE these medications   aspirin EC 81 MG tablet Take 1 tablet (81 mg total) by mouth daily.   buPROPion 300 MG 24 hr tablet Commonly known as: WELLBUTRIN XL Take 1 tablet (300 mg total) by mouth every morning.   clopidogrel 75 MG tablet Commonly known as: Plavix Take 1 tablet (75 mg total) by mouth daily.   furosemide 40 MG tablet Commonly known as: LASIX Take 1 tablet (40 mg total) by mouth daily. For one week then stop.   levothyroxine 75 MCG tablet Commonly known as: SYNTHROID Take 75 mcg by mouth daily before breakfast.   losartan 25 MG tablet Commonly known as:  COZAAR Take 1 tablet (25 mg total) by mouth every morning. What changed:   medication strength  how much to take   metoprolol tartrate 25 MG tablet Commonly known as: LOPRESSOR Take 1 tablet (25 mg total) by mouth daily.   ondansetron 4 MG tablet Commonly known as: Zofran Take 1 tablet (4 mg total) by mouth every 8 (eight) hours as needed for nausea or vomiting.   Potassium Chloride ER 20 MEQ Tbcr Take 20 mEq by mouth daily. For one week then stop.   rOPINIRole 1 MG tablet Commonly known as: REQUIP Take 1 mg by mouth 3 (three) times daily.   rOPINIRole 4 MG tablet Commonly known as: REQUIP Take 4 mg by mouth at bedtime.   rosuvastatin 20 MG tablet Commonly known as: CRESTOR Take 1 tablet (20 mg total) by mouth daily at 6 PM.   topiramate 100 MG tablet Commonly known as: TOPAMAX Take 100 mg by mouth 2 (two) times daily.   traMADol 50 MG tablet Commonly known as: ULTRAM Take 1 tablet (50 mg total) by mouth every 6 (six) hours as needed for moderate pain.    traZODone 150 MG tablet Commonly known as: DESYREL Take 150 mg by mouth at bedtime.      The patient has been discharged on:   1.Beta Blocker:  Yes [ x  ]                              No   [   ]                              If No, reason:  2.Ace Inhibitor/ARB: Yes [  x ]                                     No  [    ]                                     If No, reason:  3.Statin:   Yes [  x ]                  No  [   ]                  If No, reason:  4.Ecasa:  Yes  [ x  ]                  No   [   ]                  If No, reason:  Follow-up Information    Call Garwin Brothers, MD.   Specialty: Internal Medicine Why: for a follow up appointment for further surveillance of HGA1C 5.8 (pre diabetes) Contact information: [place] [address] [phone]        Jenean Lindau, MD Follow up on 10/03/2019.   Specialty: Cardiology Why: @9 :20am for hospital follow up  Contact information: [place] [address] [phone]        [facility] Follow up on 09/25/2019.   Specialty: Cardiothoracic Surgery Why: Appointment is at 11:30 for chest  tube suture removal Contact information: [place], [place] [phone]       [facility] Follow up on 10/17/2019.   Specialty: Cardiothoracic Surgery Why: Appointment is at 1:00, please get CXR at 12:30 at [facility] located on first floor of our office building Contact information: [place], [place] [phone]          Signed: Elgie Collard PA-C 09/19/2019, 12:18 PM

## 2019-09-18 NOTE — Progress Notes (Addendum)
Patient EPW pulled per protocol and as ordered. All ends intact, slight resistance met with right atrial wire but was removed. Slight oozing from left ventrical site, gauze applied. bp 156/75 heart rate 79 patient reminded to lie supine approximately one hour. Will monitor patient. Merly Hinkson, Bettina Gavia RN

## 2019-09-18 NOTE — Progress Notes (Addendum)
CARDIAC REHAB PHASE I   PRE:  Rate/Rhythm: 75 SR  BP:  Sitting: 153/71      SaO2: 94 RA  MODE:  Ambulation: 470 ft   POST:  Rate/Rhythm: 99 SR  BP:  Sitting: 188/79    SaO2: 97 RA  Pt ambulated 438ft in hallway standby assist with front wheel walker. Pt denies CP or SOB, does state some fatigue at end of walk. Encouraged continued IS use, walks, and sternal precautions. Pt currently demonstrating 500 on IS. Pt requesting walker for home use. Will continue to follow.  [id number] Rufina Falco, RN BSN 09/18/2019 9:15 AM

## 2019-09-18 NOTE — Progress Notes (Signed)
Patient up to chair 2 times today, will monitor patient. Maddyx Vallie, Bettina Gavia RN

## 2019-09-18 NOTE — Discharge Instructions (Signed)

## 2019-09-19 LAB — BASIC METABOLIC PANEL
Anion gap: 9 (ref 5–15)
BUN: 18 mg/dL (ref 6–20)
CO2: 24 mmol/L (ref 22–32)
Calcium: 8.6 mg/dL — ABNORMAL LOW (ref 8.9–10.3)
Chloride: 106 mmol/L (ref 98–111)
Creatinine, Ser: 1.35 mg/dL — ABNORMAL HIGH (ref 0.44–1.00)
GFR calc Af Amer: 49 mL/min — ABNORMAL LOW (ref >60)
GFR calc non Af Amer: 43 mL/min — ABNORMAL LOW (ref >60)
Glucose, Bld: 109 mg/dL — ABNORMAL HIGH (ref 70–99)
Potassium: 3.9 mmol/L (ref 3.5–5.1)
Sodium: 139 mmol/L (ref 135–145)

## 2019-09-19 MED ORDER — FUROSEMIDE 40 MG PO TABS: 40.0000 mg | ORAL_TABLET | Freq: Every day | ORAL | 0 refills | Status: DC

## 2019-09-19 MED ORDER — ROSUVASTATIN CALCIUM 20 MG PO TABS: 20.0000 mg | ORAL_TABLET | Freq: Every day | ORAL | 1 refills | Status: DC

## 2019-09-19 MED ORDER — METOPROLOL TARTRATE 25 MG PO TABS: 25.0000 mg | ORAL_TABLET | Freq: Every day | ORAL | 1 refills | Status: DC

## 2019-09-19 MED ORDER — LOSARTAN POTASSIUM 25 MG PO TABS: 25.0000 mg | ORAL_TABLET | Freq: Every morning | ORAL | 1 refills | Status: DC

## 2019-09-19 MED ORDER — POTASSIUM CHLORIDE CRYS ER 20 MEQ PO TBCR
20.0000 meq | EXTENDED_RELEASE_TABLET | Freq: Once | ORAL | Status: AC
  Administered 2019-09-19: 11:00:00 20 meq via ORAL
  Filled 2019-09-19: qty 1

## 2019-09-19 MED ORDER — POTASSIUM CHLORIDE ER 20 MEQ PO TBCR: 20.0000 meq | EXTENDED_RELEASE_TABLET | Freq: Every day | ORAL | 0 refills | Status: DC

## 2019-09-19 MED ORDER — CLOPIDOGREL BISULFATE 75 MG PO TABS: 75.0000 mg | ORAL_TABLET | Freq: Every day | ORAL | 1 refills | Status: DC

## 2019-09-19 MED ORDER — TRAMADOL HCL 50 MG PO TABS: 50.0000 mg | ORAL_TABLET | Freq: Four times a day (QID) | ORAL | 0 refills | Status: DC | PRN

## 2019-09-19 MED ORDER — ONDANSETRON HCL 4 MG PO TABS: 4.0000 mg | ORAL_TABLET | Freq: Three times a day (TID) | ORAL | 0 refills | Status: DC | PRN

## 2019-09-19 NOTE — Progress Notes (Signed)
Pt discharged today to home with son.  Pt's IV's removed.  Pt taken of telemetry and CCMD notified.  Pt left with all of their personal belongings.  AVS documentation reviewed with Pt and all questions answered.

## 2019-09-19 NOTE — Progress Notes (Signed)
CARDIAC REHAB PHASE I   D/c education completed with pt. Pt educated on importance of site care and monitoring incisions daily. Encouraged continued IS use, walks, and sternal precautions. Pt given in-the-tube sheet along with heart healthy diet. Reviewed restrictions and exercise guidelines. Pt denies further questions or concerns. Will send CRP II referral to [facility].  [id number] Rufina Falco, RN BSN 09/19/2019 8:50 AM

## 2019-09-19 NOTE — Plan of Care (Signed)
  Problem: Clinical Measurements: Goal: Diagnostic test results will improve Outcome: Progressing Goal: Respiratory complications will improve Outcome: Progressing   

## 2019-09-19 NOTE — Progress Notes (Addendum)
      [address]       [place] [address]             [phone]        4 Days Post-Op Procedure(s) (LRB): CORONARY ARTERY BYPASS GRAFTING (CABG) times three on pump using left internal mammary artery and right and left greater saphenous veins harvested endoscopically (N/A) TRANSESOPHAGEAL ECHOCARDIOGRAM (TEE) (N/A)  Subjective: Patient without complaints this am. She would like to go home.  Objective: Vital signs in last 24 hours: Temp:  [97.7 F (36.5 C)-98.6 F (37 C)] 98.3 F (36.8 C) (12/22 0311) Pulse Rate:  [75-97] 81 (12/22 0311) Cardiac Rhythm: Normal sinus rhythm (12/21 2200) Resp:  [15-22] 18 (12/22 0311) BP: (102-156)/(53-90) 114/65 (12/22 0311) SpO2:  [96 %-100 %] 97 % (12/22 0311) Weight:  [59.7 kg] 59.7 kg (12/22 0311)  Pre op weight 58.9 kg Current Weight  09/19/19 59.7 kg      Intake/Output from previous day: 12/21 0701 - 12/22 0700 In: 363 [P.O.:360; I.V.:3] Out: 1400 [Urine:1400]   Physical Exam:  Cardiovascular: RRR Pulmonary: Slightly diminished bibasilar breath sounds Abdomen: Soft, non tender, bowel sounds present. Extremities: Mild bilateral lower extremity edema. Wounds: Clean and dry.  No erythema or signs of infection.  Lab Results: CBC: Recent Labs    09/17/19 0358 09/18/19 0355  WBC 11.1* 9.9  HGB 9.4* 8.7*  HCT 28.6* 27.3*  PLT 148* 151   BMET:  Recent Labs    09/18/19 0355 09/19/19 0300  NA 141 139  K 3.9 3.9  CL 110 106  CO2 24 24  GLUCOSE 110* 109*  BUN 19 18  CREATININE 1.32* 1.35*  CALCIUM 8.2* 8.6*    PT/INR:  Lab Results  Component Value Date   INR 1.3 (H) 09/15/2019   ABG:  INR: Will add last result for INR, ABG once components are confirmed Will add last 4 CBG results once components are confirmed  Assessment/Plan:  1. CV - SR. On Losartan 25 mg daily and Lopressor 25 mg bid. Per Dr. Cyndia Bent, will discharge on ec asa 81 mg daily and Plavix 75 mg daily. 2.  Pulmonary - on  room air. Encourage incentive spirometer. 3. Volume Overload - On lasix 40 mg daily 4.  Acute blood loss anemia - Last H and H 8.7 and 27.3 5. Creatinine this am 1.35. Her admission creatinine was 1.43. She is on low dose Losartan but will continue for good control of BP 6. Supplement potassium 7. Hypothyroidism-on Levothyroxine 75 mcg daily 9. Will  discharge  Donielle M ZimmermanPA-C 09/19/2019,7:29 AM   Chart reviewed, patient examined, agree with above. She feels well, ambulating well and bowels working. Diuresed well yesterday and wt down about 6 lbs. Only 3 lbs over preop now. Will plan to discharge today. Will continue lasix for 5 more days.

## 2019-09-20 MED FILL — Mannitol IV Soln 20%: INTRAVENOUS | Qty: 500 | Status: AC

## 2019-09-20 MED FILL — Electrolyte-R (PH 7.4) Solution: INTRAVENOUS | Qty: 4000 | Status: AC

## 2019-09-20 MED FILL — Heparin Sodium (Porcine) Inj 1000 Unit/ML: INTRAMUSCULAR | Qty: 10 | Status: AC

## 2019-09-20 MED FILL — Sodium Bicarbonate IV Soln 8.4%: INTRAVENOUS | Qty: 50 | Status: AC

## 2019-09-20 MED FILL — Lidocaine HCl Local Soln Prefilled Syringe 100 MG/5ML (2%): INTRAMUSCULAR | Qty: 5 | Status: AC

## 2019-09-20 MED FILL — Potassium Chloride Inj 2 mEq/ML: INTRAVENOUS | Qty: 40 | Status: AC

## 2019-09-20 MED FILL — Heparin Sodium (Porcine) Inj 1000 Unit/ML: INTRAMUSCULAR | Qty: 30 | Status: AC

## 2019-09-20 MED FILL — Magnesium Sulfate Inj 50%: INTRAMUSCULAR | Qty: 10 | Status: AC

## 2019-09-20 MED FILL — Sodium Chloride IV Soln 0.9%: INTRAVENOUS | Qty: 2000 | Status: AC

## 2019-09-25 ENCOUNTER — Ambulatory Visit (INDEPENDENT_AMBULATORY_CARE_PROVIDER_SITE_OTHER): Payer: Self-pay

## 2019-09-25 ENCOUNTER — Other Ambulatory Visit: Payer: Self-pay

## 2019-09-25 VITALS — Temp 97.5°F

## 2019-09-25 DIAGNOSIS — Z4802 Encounter for removal of sutures: Secondary | ICD-10-CM

## 2019-09-25 DIAGNOSIS — Z951 Presence of aortocoronary bypass graft: Secondary | ICD-10-CM

## 2019-09-25 NOTE — Progress Notes (Signed)
Pt comes in for suture removal s/p CABG x 3 on 09/15/19; a/o x 4 and w/o complaint. Three chest tube incision sites w/ sutures c/d/i, well approximated and no s/s of infection noted. Removed sutures per standard protocol w/o difficulty. Instructed pt to keep sites clean (w/ mild soap and water) and dry. To notify office of any s/s of infection or other problems.

## 2019-09-26 ENCOUNTER — Telehealth: Payer: Self-pay

## 2019-09-26 NOTE — Telephone Encounter (Signed)
Patient contacted regarding discharge from [facility] on 12//22/20.  Patient understands to follow up with provider Dr. Geraldo Pitter on 10/03/2019 at 0920 at [facility]. Patient understands discharge instructions? y Patient understands medications and regiment? y Patient understands to bring all medications to this visit? y   Postop Surgical Patients:                What is your wound status? No s/s of infection. Patient advised on s/s to look for.  .             Do you have any questions about your medications? Not questions at this time. She is currently taking tylenol PRN and tramadol 1 tablet daily.              She does not have any pain issues currently. Pain level-0  .             No at home ADL's needs at this time.  .             Patient is familiar with Pre/post surgery booklet, with no current questions.   .             Scheduled to have sutures removed on Monday, Jan. 4, 2021  For surgery related questions staff will route a phone note to CV DIV [facility] pool  [facility] [place], [place] [address]  For nonsurgery patients please delete the surgery questions.

## 2019-09-26 NOTE — Telephone Encounter (Signed)
-----   Message from Frederic Jericho sent at 09/19/2019  3:29 PM EST ----- Regarding: RE: TOC CALL [name] 09/18/2019 Because the PA called from [facility] and it states that she isnt discharged til today so this was just a courtesty reminder to you to call her for a TOC this week. ??? ----- Message ----- From: Beckey Rutter, RN Sent: 09/19/2019  12:22 PM EST To: Frederic Jericho Subject: RE: TOC CALL [name] 09/18/2019                Patient is still in hospital, not sure why DC TOC is needed at this moment???   Romina Divirgilio ----- Message ----- From: Frederic Jericho Sent: 09/18/2019  10:56 AM EST To: Beckey Rutter, RN Subject: TOC CALL [name] 09/18/2019                    Please make TOC call to patient dc from [facility] 09/19/2019.

## 2019-10-03 ENCOUNTER — Other Ambulatory Visit: Payer: Self-pay

## 2019-10-03 ENCOUNTER — Encounter: Payer: Self-pay | Admitting: Cardiology

## 2019-10-03 ENCOUNTER — Ambulatory Visit (INDEPENDENT_AMBULATORY_CARE_PROVIDER_SITE_OTHER): Payer: Commercial Managed Care - PPO | Admitting: Cardiology

## 2019-10-03 VITALS — BP 110/64 | HR 84 | Ht 63.0 in | Wt 129.0 lb

## 2019-10-03 DIAGNOSIS — E088 Diabetes mellitus due to underlying condition with unspecified complications: Secondary | ICD-10-CM

## 2019-10-03 DIAGNOSIS — Z87891 Personal history of nicotine dependence: Secondary | ICD-10-CM | POA: Diagnosis not present

## 2019-10-03 DIAGNOSIS — Z8249 Family history of ischemic heart disease and other diseases of the circulatory system: Secondary | ICD-10-CM

## 2019-10-03 DIAGNOSIS — I25119 Atherosclerotic heart disease of native coronary artery with unspecified angina pectoris: Secondary | ICD-10-CM

## 2019-10-03 DIAGNOSIS — I2510 Atherosclerotic heart disease of native coronary artery without angina pectoris: Secondary | ICD-10-CM

## 2019-10-03 DIAGNOSIS — I10 Essential (primary) hypertension: Secondary | ICD-10-CM

## 2019-10-03 DIAGNOSIS — Z951 Presence of aortocoronary bypass graft: Secondary | ICD-10-CM

## 2019-10-03 HISTORY — DX: Atherosclerotic heart disease of native coronary artery with unspecified angina pectoris: I25.119

## 2019-10-03 LAB — CBC WITH DIFFERENTIAL/PLATELET
Basophils Absolute: 0.1 10*3/uL (ref 0.0–0.2)
Basos: 1 %
EOS (ABSOLUTE): 0.5 10*3/uL — ABNORMAL HIGH (ref 0.0–0.4)
Eos: 6 %
Hematocrit: 31.2 % — ABNORMAL LOW (ref 34.0–46.6)
Hemoglobin: 10.3 g/dL — ABNORMAL LOW (ref 11.1–15.9)
Immature Grans (Abs): 0.1 10*3/uL (ref 0.0–0.1)
Immature Granulocytes: 1 %
Lymphocytes Absolute: 1.6 10*3/uL (ref 0.7–3.1)
Lymphs: 20 %
MCH: 29.1 pg (ref 26.6–33.0)
MCHC: 33 g/dL (ref 31.5–35.7)
MCV: 88 fL (ref 79–97)
Monocytes Absolute: 0.4 10*3/uL (ref 0.1–0.9)
Monocytes: 5 %
Neutrophils Absolute: 5.5 10*3/uL (ref 1.4–7.0)
Neutrophils: 67 %
Platelets: 401 10*3/uL (ref 150–450)
RBC: 3.54 x10E6/uL — ABNORMAL LOW (ref 3.77–5.28)
RDW: 13.2 % (ref 11.7–15.4)
WBC: 8.1 10*3/uL (ref 3.4–10.8)

## 2019-10-03 LAB — BASIC METABOLIC PANEL
BUN/Creatinine Ratio: 12 (ref 12–28)
BUN: 17 mg/dL (ref 8–27)
CO2: 21 mmol/L (ref 20–29)
Calcium: 9.2 mg/dL (ref 8.7–10.3)
Chloride: 107 mmol/L — ABNORMAL HIGH (ref 96–106)
Creatinine, Ser: 1.42 mg/dL — ABNORMAL HIGH (ref 0.57–1.00)
GFR calc Af Amer: 46 mL/min/{1.73_m2} — ABNORMAL LOW (ref >59)
GFR calc non Af Amer: 40 mL/min/{1.73_m2} — ABNORMAL LOW (ref >59)
Glucose: 105 mg/dL — ABNORMAL HIGH (ref 65–99)
Potassium: 4 mmol/L (ref 3.5–5.2)
Sodium: 139 mmol/L (ref 134–144)

## 2019-10-03 NOTE — Progress Notes (Signed)
Cardiology Office Note:    Date:  10/03/2019   ID:  Meghan Terry, DOB March 02, 1961, MRN [id number]  PCP:  Garwin Brothers, MD  Cardiologist:  Jenean Lindau, MD   Referring MD: Garwin Brothers, MD    ASSESSMENT:    1. Essential hypertension   2. Coronary artery disease involving native coronary artery of native heart without angina pectoris   3. Diabetes mellitus due to underlying condition with unspecified complications ([place])   4. Ex-smoker   5. Family history of coronary artery disease   6. S/P CABG x 3    PLAN:    In order of problems listed above:  1. Coronary artery disease: Secondary prevention stressed with the patient.  Importance of compliance with diet and medication stressed and she vocalized understanding.  I discussed with the findings of coronary angiography and details of hospitalizations and questions were answered to her satisfaction.  Total time for this evaluation for the aforementioned was 35 minutes.  She also has completely quit smoking and vaping. 2. Essential hypertension: Blood pressure is stable 3. Mixed dyslipidemia: Patient is on statin therapy. 4. She will have blood work including Chem-7 and CBC today.  She was significantly anemic and received transfusion in the process of hospitalization. 5. She will be seen in follow-up appointment in a month or earlier if she has any concerns at that time I will do fasting blood work including lipids.  She has an appointment with the surgeon coming on the 19th.   Medication Adjustments/Labs and Tests Ordered: Current medicines are reviewed at length with the patient today.  Concerns regarding medicines are outlined above.  No orders of the defined types were placed in this encounter.  No orders of the defined types were placed in this encounter.    Chief Complaint  Patient presents with  . Follow-up     History of Present Illness:    Meghan Terry is a 59 y.o. female.  Patient was evaluated by me for symptoms  suggesting of angina and underwent coronary angiography which revealed triple-vessel coronary artery disease and underwent bypass surgery.  The details are noted below.  Patient has history of essential hypertension and dyslipidemia.  She denies any problems at this time and takes care of activities of daily living.  No chest pain orthopnea or PND.  She is walking to the mailbox 2 or 3 times a day without any symptoms and is very happy about it.  At the time of my evaluation, the patient is alert awake oriented and in no distress.  Past Medical History:  Diagnosis Date  . Anxiety   . Coronary artery disease   . Hypertension   . Hypothyroid   . RLS (restless legs syndrome)   . Thyroid disease     Past Surgical History:  Procedure Laterality Date  . ABDOMINAL HYSTERECTOMY    . APPENDECTOMY    . CHOLECYSTECTOMY    . COLONOSCOPY    . CORONARY ARTERY BYPASS GRAFT N/A 09/15/2019   Procedure: CORONARY ARTERY BYPASS GRAFTING (CABG) times three on pump using left internal mammary artery and right and left greater saphenous veins harvested endoscopically;  Surgeon: Gaye Pollack, MD;  Location: [facility];  Service: Open Heart Surgery;  Laterality: N/A;  . KNEE ARTHROSCOPY    . LEFT HEART CATH AND CORONARY ANGIOGRAPHY N/A 09/14/2019   Procedure: LEFT HEART CATH AND CORONARY ANGIOGRAPHY;  Surgeon: [name], MD;  Location: [facility];  Service: Cardiovascular;  Laterality: N/A;  . OSTEOCHONDRAL DEFECT REPAIR/RECONSTRUCTION Left 11/29/2014   Procedure: LEFT ANKLE MEDIAL MALLEOLUS OSTEOTOMY,AUTO GRAFT FROM CALCANEOUS;  OS TIBIA DENORO GRAFTING TALUS;  Surgeon: Wylene Simmer, MD;  Location: [facility];  Service: Orthopedics;  Laterality: Left;  . TEE WITHOUT CARDIOVERSION N/A 09/15/2019   Procedure: TRANSESOPHAGEAL ECHOCARDIOGRAM (TEE);  Surgeon: Gaye Pollack, MD;  Location: [facility];  Service: Open Heart Surgery;  Laterality: N/A;  . TUBAL LIGATION      Current  Medications: Current Meds  Medication Sig  . aspirin EC 81 MG tablet Take 1 tablet (81 mg total) by mouth daily.  Marland Kitchen buPROPion (WELLBUTRIN XL) 300 MG 24 hr tablet Take 1 tablet (300 mg total) by mouth every morning.  . clopidogrel (PLAVIX) 75 MG tablet Take 1 tablet (75 mg total) by mouth daily.  Marland Kitchen levothyroxine (SYNTHROID) 75 MCG tablet Take 75 mcg by mouth daily before breakfast.  . losartan (COZAAR) 25 MG tablet Take 1 tablet (25 mg total) by mouth every morning.  . metoprolol tartrate (LOPRESSOR) 25 MG tablet Take 1 tablet (25 mg total) by mouth daily.  . ondansetron (ZOFRAN) 4 MG tablet Take 1 tablet (4 mg total) by mouth every 8 (eight) hours as needed for nausea or vomiting.  Marland Kitchen rOPINIRole (REQUIP) 1 MG tablet Take 1 mg by mouth 3 (three) times daily.   Marland Kitchen rOPINIRole (REQUIP) 4 MG tablet Take 4 mg by mouth at bedtime.  . rosuvastatin (CRESTOR) 20 MG tablet Take 1 tablet (20 mg total) by mouth daily at 6 PM.  . topiramate (TOPAMAX) 100 MG tablet Take 100 mg by mouth 2 (two) times daily.  . traMADol (ULTRAM) 50 MG tablet Take 1 tablet (50 mg total) by mouth every 6 (six) hours as needed for moderate pain.  . traZODone (DESYREL) 150 MG tablet Take 150 mg by mouth at bedtime.      Allergies:   Carbidopa-levodopa, Prednisone, and Venlafaxine   Social History   Socioeconomic History  . Marital status: Married    Spouse name: Not on file  . Number of children: Not on file  . Years of education: Not on file  . Highest education level: Not on file  Occupational History  . Not on file  Tobacco Use  . Smoking status: Former Research scientist (life sciences)  . Smokeless tobacco: Never Used  Substance and Sexual Activity  . Alcohol use: No  . Drug use: No  . Sexual activity: Not on file    Comment: e-cig  Other Topics Concern  . Not on file  Social History Narrative  . Not on file   Social Determinants of Health   Financial Resource Strain:   . Difficulty of Paying Living Expenses: Not on file  Food  Insecurity:   . Worried About Charity fundraiser in the Last Year: Not on file  . Ran Out of Food in the Last Year: Not on file  Transportation Needs:   . Lack of Transportation (Medical): Not on file  . Lack of Transportation (Non-Medical): Not on file  Physical Activity:   . Days of Exercise per Week: Not on file  . Minutes of Exercise per Session: Not on file  Stress:   . Feeling of Stress : Not on file  Social Connections:   . Frequency of Communication with Friends and Family: Not on file  . Frequency of Social Gatherings with Friends and Family: Not on file  . Attends Religious Services: Not on file  . Active Member  of Clubs or Organizations: Not on file  . Attends Archivist Meetings: Not on file  . Marital Status: Not on file     Family History: The patient's family history includes Asthma in her mother; COPD in her mother; Congestive Heart Failure in her father; Diabetes in her mother; Macular degeneration in her mother.  ROS:   Please see the history of present illness.    All other systems reviewed and are negative.  EKGs/Labs/Other Studies Reviewed:    The following studies were reviewed today: [name], MD Study date: 09/14/19  MyChart Results Release  MyChart Status: Pending Results Release  Physicians  Panel Physicians Referring Physician Case Authorizing Physician  [name], MD (Primary)    Procedures  LEFT HEART CATH AND CORONARY ANGIOGRAPHY  Conclusion    Mid RCA lesion is 75% stenosed.  Dist RCA lesion is 70% stenosed.  RPAV lesion is 75% stenosed.  1st Mrg lesion is 90% stenosed.  Prox Cx to Mid Cx lesion is 99% stenosed.  Ost LAD to Prox LAD lesion is 80% stenosed.  Mid LAD lesion is 50% stenosed.  Dist LAD lesion is 50% stenosed.  The left ventricular systolic function is normal.  LV end diastolic pressure is normal.  The left ventricular ejection fraction is 55-65% by visual estimate.  There  is no aortic valve stenosis.   Severe three vessel disease.  Cardiac surgery consult.  Will start IV heparin given subtotal circ lesion.  Results discussed with the son.        Recent Labs: 09/16/2019: Magnesium 2.4 09/17/2019: ALT 14 09/18/2019: Hemoglobin 8.7; Platelets 151 09/19/2019: BUN 18; Creatinine, Ser 1.35; Potassium 3.9; Sodium 139  Recent Lipid Panel    Component Value Date/Time   CHOL 97 09/18/2019 0355   TRIG 118 09/18/2019 0355   HDL 29 (L) 09/18/2019 0355   CHOLHDL 3.3 09/18/2019 0355   VLDL 24 09/18/2019 0355   LDLCALC 44 09/18/2019 0355    Physical Exam:    VS:  BP 110/64   Pulse 84   Ht 5\' 3"  (1.6 m)   Wt 129 lb (58.5 kg)   SpO2 99%   BMI 22.85 kg/m     Wt Readings from Last 3 Encounters:  10/03/19 129 lb (58.5 kg)  09/19/19 131 lb 11.2 oz (59.7 kg)  09/05/19 129 lb (58.5 kg)     GEN: Patient is in no acute distress HEENT: Normal NECK: No JVD; No carotid bruits LYMPHATICS: No lymphadenopathy CARDIAC: Hear sounds regular, 2/6 systolic murmur at the apex. RESPIRATORY:  Clear to auscultation without rales, wheezing or rhonchi  ABDOMEN: Soft, non-tender, non-distended MUSCULOSKELETAL:  No edema; No deformity.  Scar of sternotomy and other areas of surgical excision healing well. SKIN: Warm and dry NEUROLOGIC:  Alert and oriented x 3 PSYCHIATRIC:  Normal affect   Signed, Jenean Lindau, MD  10/03/2019 9:42 AM    [facility]

## 2019-10-03 NOTE — Patient Instructions (Signed)
Medication Instructions:  Your physician recommends that you continue on your current medications as directed. Please refer to the Current Medication list given to you today.  *If you need a refill on your cardiac medications before your next appointment, please call your pharmacy*  Lab Work: Your physician recommends that you have a CBC and BMP drawn today  If you have labs (blood work) drawn today and your tests are completely normal, you will receive your results only by: Marland Kitchen MyChart Message (if you have MyChart) OR . A paper copy in the mail If you have any lab test that is abnormal or we need to change your treatment, we will call you to review the results.  Testing/Procedures: NONE  Follow-Up: At [facility], you and your health needs are our priority.  As part of our continuing mission to provide you with exceptional heart care, we have created designated Provider Care Teams.  These Care Teams include your primary Cardiologist (physician) and Advanced Practice Providers (APPs -  Physician Assistants and Nurse Practitioners) who all work together to provide you with the care you need, when you need it.  Your next appointment:   1 month(s)  The format for your next appointment:   In Person  Provider:   Jyl Heinz, MD

## 2019-10-16 ENCOUNTER — Other Ambulatory Visit: Payer: Self-pay | Admitting: Surgery

## 2019-10-16 DIAGNOSIS — Z951 Presence of aortocoronary bypass graft: Secondary | ICD-10-CM

## 2019-10-17 ENCOUNTER — Other Ambulatory Visit: Payer: Self-pay

## 2019-10-17 ENCOUNTER — Ambulatory Visit
Admission: RE | Admit: 2019-10-17 | Discharge: 2019-10-17 | Disposition: A | Discharge status: Home / Self Care | Payer: Commercial Managed Care - PPO | Source: Ambulatory Visit | Attending: Surgery | Admitting: Surgery

## 2019-10-17 ENCOUNTER — Ambulatory Visit (INDEPENDENT_AMBULATORY_CARE_PROVIDER_SITE_OTHER): Payer: Self-pay | Admitting: Surgical

## 2019-10-17 VITALS — BP 142/84 | HR 90 | Temp 97.5°F | Resp 16 | Ht 63.0 in | Wt 132.0 lb

## 2019-10-17 DIAGNOSIS — Z951 Presence of aortocoronary bypass graft: Secondary | ICD-10-CM

## 2019-10-17 DIAGNOSIS — I2510 Atherosclerotic heart disease of native coronary artery without angina pectoris: Secondary | ICD-10-CM

## 2019-10-17 NOTE — Progress Notes (Signed)
[address]       [place], [address]             [phone]      Meghan Terry  Medical Record [id number] Date of Birth: 1960/10/03  Referring: Revankar, Reita Cliche, MD Primary Care: Garwin Brothers, MD Primary Cardiologist: No primary care provider on file.   Chief Complaint:   POST OP FOLLOW UP CARDIOVASCULAR SURGERY OPERATIVE NOTE  09/15/2019  Surgeon:  Gaye Pollack, MD  First Assistant: Ellwood Handler,  PA-C   Preoperative Diagnosis:  Severe multi-vessel coronary artery disease   Postoperative Diagnosis:  Same   Procedure:  1. Median Sternotomy 2. Extracorporeal circulation 3.   Coronary artery bypass grafting x 3   Left internal mammary graft to the LAD  SVG to OM  SVG to PDA  4.   Endoscopic vein harvest from the right and left legs   Anesthesia:  General Endotracheal  History of Present Illness:    The patient is a 59 year old female status post the above described procedure.  She is seen in the office on today's date and routine postsurgical follow-up.  Overall she reports that she is doing well.  She does have some left-sided chest discomfort in the region of the mammary artery that she describes as a soreness but is improving over time.  She denies fevers, chills or other significant constitutional symptoms.  She is not having any significant shortness of breath.  She does not have any lower extremity edema.  She is not having palpitations.  Overall she is quite pleased with her progress.      Past Medical History:  Diagnosis Date  . Anxiety   . Coronary artery disease   . Hypertension   . Hypothyroid   . RLS (restless legs syndrome)   . Thyroid disease      Social History   Tobacco Use  Smoking Status Former Smoker  Smokeless Tobacco Never Used    Social History   Substance and Sexual Activity  Alcohol Use No     Allergies  Allergen Reactions  . Carbidopa-Levodopa Other (See Comments)     Developed tics while taking   . Prednisone Other (See Comments)    Turns red all over   . Venlafaxine Other (See Comments)    Developed tics while taking     Current Outpatient Medications  Medication Sig Dispense Refill  . aspirin EC 81 MG tablet Take 1 tablet (81 mg total) by mouth daily. 90 tablet 3  . buPROPion (WELLBUTRIN XL) 300 MG 24 hr tablet Take 1 tablet (300 mg total) by mouth every morning. 15 tablet 0  . clopidogrel (PLAVIX) 75 MG tablet Take 1 tablet (75 mg total) by mouth daily. 30 tablet 1  . levothyroxine (SYNTHROID) 75 MCG tablet Take 75 mcg by mouth daily before breakfast.    . losartan (COZAAR) 25 MG tablet Take 1 tablet (25 mg total) by mouth every morning. 30 tablet 1  . metoprolol tartrate (LOPRESSOR) 25 MG tablet Take 1 tablet (25 mg total) by mouth daily. 60 tablet 1  . ondansetron (ZOFRAN) 4 MG tablet Take 1 tablet (4 mg total) by mouth every 8 (eight) hours as needed for nausea or vomiting. 20 tablet 0  . rOPINIRole (REQUIP) 1 MG tablet Take 1 mg by mouth 3 (three) times daily.     Marland Kitchen rOPINIRole (REQUIP) 4 MG tablet Take 4 mg by mouth at bedtime.    Marland Kitchen  rosuvastatin (CRESTOR) 20 MG tablet Take 1 tablet (20 mg total) by mouth daily at 6 PM. 30 tablet 1  . topiramate (TOPAMAX) 100 MG tablet Take 100 mg by mouth 2 (two) times daily.    . traMADol (ULTRAM) 50 MG tablet Take 1 tablet (50 mg total) by mouth every 6 (six) hours as needed for moderate pain. 30 tablet 0  . traZODone (DESYREL) 150 MG tablet Take 150 mg by mouth at bedtime.      No current facility-administered medications for this visit.       Physical Exam: BP (!) 142/84 (BP Location: Right Arm, Patient Position: Sitting, Cuff Size: Normal)   Pulse 90   Temp (!) 97.5 F (36.4 C)   Resp 16   Ht 5\' 3"  (1.6 m)   Wt 59.9 kg   SpO2 99% Comment: RA  BMI 23.38 kg/m   General appearance: alert, cooperative and no distress Heart: regular rate and rhythm Lungs: clear to auscultation  bilaterally Abdomen: Benign exam Extremities: No edema Wound: Incisions are noted to be healing well without evidence of infection   Diagnostic Studies & Laboratory data:     Recent Radiology Findings:   DG Chest 2 View  Result Date: 10/17/2019 CLINICAL DATA:  CABG. EXAM: CHEST - 2 VIEW COMPARISON:  09/18/2019, 09/15/2019, 09/14/2019, 10/28/2007. Right rib series 06/17/2011. FINDINGS: Prior CABG. Heart size normal. Questionable nodular opacity right upper lung. Repeat PA lateral chest x-ray suggested. This nodular opacity persist nonenhanced chest CT suggested for further evaluation. Previously identified basilar atelectasis/infiltrates have cleared. Tiny residual left pleural effusion cannot be excluded. Surgical clips right upper quadrant. IMPRESSION: 1.  Prior CABG.  Heart size normal. 2. Questionable nodular opacity right upper lung. Repeat PA and lateral chest x-ray suggested. If this nodular opacity persist nonenhanced chest CT can be obtained for further evaluation. 3. Previously identified bibasilar atelectasis/infiltrates have cleared. Tiny residual left pleural effusion cannot be excluded. Electronically Signed   By: Marcello Moores  Register   On: 10/17/2019 12:22      Recent Lab Findings: Lab Results  Component Value Date   WBC 8.1 10/03/2019   HGB 10.3 (L) 10/03/2019   HCT 31.2 (L) 10/03/2019   PLT 401 10/03/2019   GLUCOSE 105 (H) 10/03/2019   CHOL 97 09/18/2019   TRIG 118 09/18/2019   HDL 29 (L) 09/18/2019   LDLCALC 44 09/18/2019   ALT 14 09/17/2019   AST 17 09/17/2019   NA 139 10/03/2019   K 4.0 10/03/2019   CL 107 (H) 10/03/2019   CREATININE 1.42 (H) 10/03/2019   BUN 17 10/03/2019   CO2 21 10/03/2019   INR 1.3 (H) 09/15/2019   HGBA1C 5.8 (H) 09/14/2019      Assessment / Plan: Patient is doing quite well.  There are no significant postsurgical issues.  We did discuss postsurgical advancement of activities using standard protocol including driving.  I discussed the chest  x-ray findings with the patient.  She does have an extensive history of cigarette smoking.  She has not smoked since surgery.  I told her we would see her again in 3 to 4 weeks with a repeat chest x-ray and if there is any findings related to this nodule she may require a chest CT scan.  I did encourage her to try not to worry about this as it is a very subtle finding.  I did not make any medication changes.  She does have a follow-up appointment with Dr. Geraldo Pitter who is managing her medications  and may need to make some adjustments for hypertension and renal function.      Medication Changes: No orders of the defined types were placed in this encounter.     John Giovanni, PA-C 10/17/2019 1:08 PM

## 2019-10-17 NOTE — Assessment & Plan Note (Signed)
Excellent progress

## 2019-10-17 NOTE — Patient Instructions (Signed)
Discussed follow-up activity protocols including driving

## 2019-10-30 ENCOUNTER — Other Ambulatory Visit: Payer: Self-pay

## 2019-10-30 ENCOUNTER — Other Ambulatory Visit: Payer: Self-pay | Admitting: Physician Assistant

## 2019-10-30 ENCOUNTER — Ambulatory Visit (INDEPENDENT_AMBULATORY_CARE_PROVIDER_SITE_OTHER): Payer: Commercial Managed Care - PPO | Admitting: Cardiology

## 2019-10-30 ENCOUNTER — Encounter: Payer: Self-pay | Admitting: Cardiology

## 2019-10-30 VITALS — BP 170/88 | HR 77 | Ht 63.0 in | Wt 138.0 lb

## 2019-10-30 DIAGNOSIS — I2510 Atherosclerotic heart disease of native coronary artery without angina pectoris: Secondary | ICD-10-CM | POA: Diagnosis not present

## 2019-10-30 DIAGNOSIS — E088 Diabetes mellitus due to underlying condition with unspecified complications: Secondary | ICD-10-CM | POA: Diagnosis not present

## 2019-10-30 DIAGNOSIS — Z951 Presence of aortocoronary bypass graft: Secondary | ICD-10-CM

## 2019-10-30 DIAGNOSIS — Z87891 Personal history of nicotine dependence: Secondary | ICD-10-CM | POA: Diagnosis not present

## 2019-10-30 DIAGNOSIS — Z8249 Family history of ischemic heart disease and other diseases of the circulatory system: Secondary | ICD-10-CM

## 2019-10-30 DIAGNOSIS — I10 Essential (primary) hypertension: Secondary | ICD-10-CM

## 2019-10-30 MED ORDER — ALPRAZOLAM 0.25 MG PO TABS: 0.2500 mg | ORAL_TABLET | Freq: Every evening | ORAL | 0 refills | Status: DC | PRN

## 2019-10-30 NOTE — Patient Instructions (Signed)
Medication Instructions:  Your physician has recommended you make the following change in your medication: Take Xanax 0.25 mg as needed. *If you need a refill on your cardiac medications before your next appointment, please call your pharmacy*  Lab Work: You need to have a BMET and CBC today. You will need to have BMET, Liver function and lipids in 4 weeks when fasting.  You can return to the office Monday through Friday 8:00 to 4:30. If you have labs (blood work) drawn today and your tests are completely normal, you will receive your results only by: Marland Kitchen MyChart Message (if you have MyChart) OR . A paper copy in the mail If you have any lab test that is abnormal or we need to change your treatment, we will call you to review the results.  Testing/Procedures: None ordered  Follow-Up: At [facility], you and your health needs are our priority.  As part of our continuing mission to provide you with exceptional heart care, we have created designated Provider Care Teams.  These Care Teams include your primary Cardiologist (physician) and Advanced Practice Providers (APPs -  Physician Assistants and Nurse Practitioners) who all work together to provide you with the care you need, when you need it.  Your next appointment:   6 week(s)  The format for your next appointment:   In Person  Provider:   Jyl Heinz, MD  Other Instructions NA

## 2019-10-30 NOTE — Progress Notes (Signed)
Cardiology Office Note:    Date:  10/30/2019   ID:  Meghan Terry, DOB Sep 03, 1961, MRN [id number]  PCP:  Garwin Brothers, MD  Cardiologist:  Jenean Lindau, MD   Referring MD: Garwin Brothers, MD    ASSESSMENT:    1. Coronary artery disease involving native coronary artery of native heart without angina pectoris   2. Essential hypertension   3. Diabetes mellitus due to underlying condition with unspecified complications ([place])   4. Ex-smoker   5. Family history of coronary artery disease   6. S/P CABG x 3    PLAN:    In order of problems listed above:  1. Coronary artery disease: Secondary prevention stressed with the patient.  Importance of compliance with diet and medication stressed and she vocalized understanding.  Apply external pressure and pressure on the area of the graft is tender.  There is no sign of any infection no redness and this pain has been steady ever since the bypass surgery.  She has an appointment with the surgeon and she is going to speak to them in the next week or 2.  If she has any urgent concerns she knows to call them.  She mentions to me that her chest x-ray was also unremarkable and assessed by cardiovascular surgery. 2. Essential hypertension: Blood pressure is stable and she tells me of her home readings.  She has an element of whitecoat hypertension. 3. Mixed dyslipidemia: Diet was discussed she will be back in 4 weeks for liver lipid check 4. She will have a Chem-7 and CBC today in view of abnormal renal function low hemoglobin 5. She has trouble falling asleep and I gave her a prescription of Xanax 0.5 mg 15 tablets with no refills to be used as needed if she has trouble falling asleep.  Precautions explained 6. Patient will be seen in follow-up appointment in 6 weeks or earlier if the patient has any concerns.  I reviewed her records extensively and total time for this evaluation was 35 minutes.    Medication Adjustments/Labs and Tests Ordered: Current  medicines are reviewed at length with the patient today.  Concerns regarding medicines are outlined above.  No orders of the defined types were placed in this encounter.  No orders of the defined types were placed in this encounter.    No chief complaint on file.    History of Present Illness:    Meghan Terry is a 59 y.o. female.  Patient has past medical history of essential hypertension and dyslipidemia.  She presented to me with chest pain and was sent for coronary angiography and has undergone CABG surgery.  Details are mentioned below.  She denies any problems at this time and takes care of activities of daily living.  No chest pain orthopnea or PND.  She complains of pain at the graft harvest site in the sternum.  She has been evaluated for this by surgery and this was found to be unremarkable and she was reassured.  At the time of my evaluation, the patient is alert awake oriented and in no distress.  Past Medical History:  Diagnosis Date  . Anxiety   . Coronary artery disease   . Hypertension   . Hypothyroid   . RLS (restless legs syndrome)   . Thyroid disease     Past Surgical History:  Procedure Laterality Date  . ABDOMINAL HYSTERECTOMY    . APPENDECTOMY    . CHOLECYSTECTOMY    . COLONOSCOPY    .  CORONARY ARTERY BYPASS GRAFT N/A 09/15/2019   Procedure: CORONARY ARTERY BYPASS GRAFTING (CABG) times three on pump using left internal mammary artery and right and left greater saphenous veins harvested endoscopically;  Surgeon: Gaye Pollack, MD;  Location: [facility];  Service: Open Heart Surgery;  Laterality: N/A;  . KNEE ARTHROSCOPY    . LEFT HEART CATH AND CORONARY ANGIOGRAPHY N/A 09/14/2019   Procedure: LEFT HEART CATH AND CORONARY ANGIOGRAPHY;  Surgeon: [name], MD;  Location: [facility];  Service: Cardiovascular;  Laterality: N/A;  . OSTEOCHONDRAL DEFECT REPAIR/RECONSTRUCTION Left 11/29/2014   Procedure: LEFT ANKLE MEDIAL MALLEOLUS OSTEOTOMY,AUTO GRAFT  FROM CALCANEOUS;  OS TIBIA DENORO GRAFTING TALUS;  Surgeon: Wylene Simmer, MD;  Location: [facility];  Service: Orthopedics;  Laterality: Left;  . TEE WITHOUT CARDIOVERSION N/A 09/15/2019   Procedure: TRANSESOPHAGEAL ECHOCARDIOGRAM (TEE);  Surgeon: Gaye Pollack, MD;  Location: [facility];  Service: Open Heart Surgery;  Laterality: N/A;  . TUBAL LIGATION      Current Medications: Current Meds  Medication Sig  . aspirin EC 81 MG tablet Take 1 tablet (81 mg total) by mouth daily.  Marland Kitchen buPROPion (WELLBUTRIN XL) 300 MG 24 hr tablet Take 1 tablet (300 mg total) by mouth every morning.  . clopidogrel (PLAVIX) 75 MG tablet Take 1 tablet (75 mg total) by mouth daily.  Marland Kitchen levothyroxine (SYNTHROID) 75 MCG tablet Take 75 mcg by mouth daily before breakfast.  . losartan (COZAAR) 25 MG tablet Take 1 tablet (25 mg total) by mouth every morning.  . metoprolol tartrate (LOPRESSOR) 25 MG tablet Take 1 tablet (25 mg total) by mouth daily.  . ondansetron (ZOFRAN) 4 MG tablet Take 1 tablet (4 mg total) by mouth every 8 (eight) hours as needed for nausea or vomiting.  Marland Kitchen rOPINIRole (REQUIP) 1 MG tablet Take 1 mg by mouth 3 (three) times daily.   Marland Kitchen rOPINIRole (REQUIP) 4 MG tablet Take 4 mg by mouth at bedtime.  . rosuvastatin (CRESTOR) 20 MG tablet Take 1 tablet (20 mg total) by mouth daily at 6 PM.  . topiramate (TOPAMAX) 100 MG tablet Take 100 mg by mouth 2 (two) times daily.  . traMADol (ULTRAM) 50 MG tablet Take 1 tablet (50 mg total) by mouth every 6 (six) hours as needed for moderate pain.  . traZODone (DESYREL) 150 MG tablet Take 150 mg by mouth at bedtime.      Allergies:   Carbidopa-levodopa, Prednisone, and Venlafaxine   Social History   Socioeconomic History  . Marital status: Married    Spouse name: Not on file  . Number of children: Not on file  . Years of education: Not on file  . Highest education level: Not on file  Occupational History  . Not on file  Tobacco Use  . Smoking  status: Former Research scientist (life sciences)  . Smokeless tobacco: Never Used  Substance and Sexual Activity  . Alcohol use: No  . Drug use: No  . Sexual activity: Not on file    Comment: e-cig  Other Topics Concern  . Not on file  Social History Narrative  . Not on file   Social Determinants of Health   Financial Resource Strain:   . Difficulty of Paying Living Expenses: Not on file  Food Insecurity:   . Worried About Charity fundraiser in the Last Year: Not on file  . Ran Out of Food in the Last Year: Not on file  Transportation Needs:   . Lack of  Transportation (Medical): Not on file  . Lack of Transportation (Non-Medical): Not on file  Physical Activity:   . Days of Exercise per Week: Not on file  . Minutes of Exercise per Session: Not on file  Stress:   . Feeling of Stress : Not on file  Social Connections:   . Frequency of Communication with Friends and Family: Not on file  . Frequency of Social Gatherings with Friends and Family: Not on file  . Attends Religious Services: Not on file  . Active Member of Clubs or Organizations: Not on file  . Attends Archivist Meetings: Not on file  . Marital Status: Not on file     Family History: The patient's family history includes Asthma in her mother; COPD in her mother; Congestive Heart Failure in her father; Diabetes in her mother; Macular degeneration in her mother.  ROS:   Please see the history of present illness.    All other systems reviewed and are negative.  EKGs/Labs/Other Studies Reviewed:    The following studies were reviewed today: [name], MD (Primary)    Procedures  LEFT HEART CATH AND CORONARY ANGIOGRAPHY  Conclusion    Mid RCA lesion is 75% stenosed.  Dist RCA lesion is 70% stenosed.  RPAV lesion is 75% stenosed.  1st Mrg lesion is 90% stenosed.  Prox Cx to Mid Cx lesion is 99% stenosed.  Ost LAD to Prox LAD lesion is 80% stenosed.  Mid LAD lesion is 50% stenosed.  Dist LAD lesion is  50% stenosed.  The left ventricular systolic function is normal.  LV end diastolic pressure is normal.  The left ventricular ejection fraction is 55-65% by visual estimate.  There is no aortic valve stenosis.   Severe three vessel disease.  Cardiac surgery consult.  Will start IV heparin given subtotal circ lesion.  Results discussed with the son.    Summary:  Right Carotid: Velocities in the right ICA are consistent with a 1-39%  stenosis.   Left Carotid: Velocities in the left ICA are consistent with a 40-59%  stenosis.   ABI: Pedal artery waveforms within normal limits.  Right Upper Extremity: Not obtained due to TR band in place.  Left Upper Extremity: Doppler waveforms remain within normal limits with  left radial compression. Doppler waveforms remain within normal limits  with left ulnar compression.   IMPRESSIONS    1. Left ventricular ejection fraction, by visual estimation, is 60 to  65%. The left ventricle has normal function. There is mildly increased  left ventricular hypertrophy.  2. Left ventricular diastolic parameters are consistent with Grade I  diastolic dysfunction (impaired relaxation).  3. The left ventricle has no regional wall motion abnormalities.  4. Global right ventricle has normal systolic function.The right  ventricular size is normal. No increase in right ventricular wall  thickness.  5. Left atrial size was normal.  6. Right atrial size was normal.  7. The mitral valve is abnormal. Mild mitral valve regurgitation.  8. The tricuspid valve is grossly normal. Tricuspid valve regurgitation  is trivial.  9. The aortic valve is tricuspid. Aortic valve regurgitation is not  visualized.  10. The pulmonic valve was grossly normal. Pulmonic valve regurgitation is  not visualized.  11. The inferior vena cava is normal in size with <50% respiratory  variability, suggesting right atrial pressure of 8 mmHg.      Recent Labs: 09/16/2019:  Magnesium 2.4 09/17/2019: ALT 14 10/03/2019: BUN 17; Creatinine, Ser 1.42; Hemoglobin 10.3;  Platelets 401; Potassium 4.0; Sodium 139  Recent Lipid Panel    Component Value Date/Time   CHOL 97 09/18/2019 0355   TRIG 118 09/18/2019 0355   HDL 29 (L) 09/18/2019 0355   CHOLHDL 3.3 09/18/2019 0355   VLDL 24 09/18/2019 0355   LDLCALC 44 09/18/2019 0355    Physical Exam:    VS:  BP (!) 170/88   Pulse 77   Ht 5\' 3"  (1.6 m)   Wt 138 lb (62.6 kg)   SpO2 99%   BMI 24.45 kg/m     Wt Readings from Last 3 Encounters:  10/30/19 138 lb (62.6 kg)  10/17/19 132 lb (59.9 kg)  10/03/19 129 lb (58.5 kg)     GEN: Patient is in no acute distress HEENT: Normal NECK: No JVD; No carotid bruits LYMPHATICS: No lymphadenopathy CARDIAC: Hear sounds regular, 2/6 systolic murmur at the apex. RESPIRATORY:  Clear to auscultation without rales, wheezing or rhonchi  ABDOMEN: Soft, non-tender, non-distended MUSCULOSKELETAL:  No edema; No deformity  SKIN: Warm and dry NEUROLOGIC:  Alert and oriented x 3 PSYCHIATRIC:  Normal affect   Signed, Jenean Lindau, MD  10/30/2019 9:35 AM    [place]

## 2019-10-31 ENCOUNTER — Other Ambulatory Visit: Payer: Self-pay

## 2019-10-31 DIAGNOSIS — Z951 Presence of aortocoronary bypass graft: Secondary | ICD-10-CM

## 2019-10-31 LAB — CBC WITH DIFFERENTIAL/PLATELET
Basophils Absolute: 0.1 10*3/uL (ref 0.0–0.2)
Basos: 1 %
EOS (ABSOLUTE): 0.3 10*3/uL (ref 0.0–0.4)
Eos: 3 %
Hematocrit: 37.7 % (ref 34.0–46.6)
Hemoglobin: 12.4 g/dL (ref 11.1–15.9)
Immature Grans (Abs): 0 10*3/uL (ref 0.0–0.1)
Immature Granulocytes: 1 %
Lymphocytes Absolute: 1.8 10*3/uL (ref 0.7–3.1)
Lymphs: 22 %
MCH: 29.7 pg (ref 26.6–33.0)
MCHC: 32.9 g/dL (ref 31.5–35.7)
MCV: 90 fL (ref 79–97)
Monocytes Absolute: 0.4 10*3/uL (ref 0.1–0.9)
Monocytes: 5 %
Neutrophils Absolute: 5.6 10*3/uL (ref 1.4–7.0)
Neutrophils: 68 %
Platelets: 313 10*3/uL (ref 150–450)
RBC: 4.18 x10E6/uL (ref 3.77–5.28)
RDW: 13.1 % (ref 11.7–15.4)
WBC: 8.2 10*3/uL (ref 3.4–10.8)

## 2019-10-31 LAB — BASIC METABOLIC PANEL
BUN/Creatinine Ratio: 13 (ref 12–28)
BUN: 20 mg/dL (ref 8–27)
CO2: 20 mmol/L (ref 20–29)
Calcium: 9.2 mg/dL (ref 8.7–10.3)
Chloride: 108 mmol/L — ABNORMAL HIGH (ref 96–106)
Creatinine, Ser: 1.49 mg/dL — ABNORMAL HIGH (ref 0.57–1.00)
GFR calc Af Amer: 44 mL/min/{1.73_m2} — ABNORMAL LOW (ref >59)
GFR calc non Af Amer: 38 mL/min/{1.73_m2} — ABNORMAL LOW (ref >59)
Glucose: 130 mg/dL — ABNORMAL HIGH (ref 65–99)
Potassium: 4.4 mmol/L (ref 3.5–5.2)
Sodium: 139 mmol/L (ref 134–144)

## 2019-10-31 MED ORDER — TRAMADOL HCL 50 MG PO TABS: 50.0000 mg | ORAL_TABLET | Freq: Two times a day (BID) | ORAL | 0 refills | Status: DC | PRN

## 2019-10-31 NOTE — Progress Notes (Signed)
Received refill request for Tramadol and Zofran. Pt is s/p CABG x 3 by Dr. Cyndia Bent on 09/15/19; last seen in office by Evonnie Pat, PA on 10/17/19. Per Evonnie Pat, Tramadol 50 mg q 12 hrs prn (#20 w/ no refills) called in to [facility] in [place]. Notified pt via VM and instructed to contact her PCP if she is continuing to have nausea.

## 2019-11-03 ENCOUNTER — Ambulatory Visit: Payer: Commercial Managed Care - PPO | Admitting: Cardiology

## 2019-11-08 DIAGNOSIS — R079 Chest pain, unspecified: Secondary | ICD-10-CM | POA: Diagnosis not present

## 2019-11-08 DIAGNOSIS — I340 Nonrheumatic mitral (valve) insufficiency: Secondary | ICD-10-CM

## 2019-11-09 DIAGNOSIS — I2510 Atherosclerotic heart disease of native coronary artery without angina pectoris: Secondary | ICD-10-CM

## 2019-11-09 DIAGNOSIS — I10 Essential (primary) hypertension: Secondary | ICD-10-CM

## 2019-11-09 DIAGNOSIS — E785 Hyperlipidemia, unspecified: Secondary | ICD-10-CM

## 2019-11-09 DIAGNOSIS — I249 Acute ischemic heart disease, unspecified: Secondary | ICD-10-CM | POA: Diagnosis not present

## 2019-11-09 DIAGNOSIS — R079 Chest pain, unspecified: Secondary | ICD-10-CM | POA: Diagnosis not present

## 2019-11-09 DIAGNOSIS — E139 Other specified diabetes mellitus without complications: Secondary | ICD-10-CM

## 2019-11-14 ENCOUNTER — Other Ambulatory Visit: Payer: Self-pay | Admitting: Surgery

## 2019-11-14 DIAGNOSIS — Z951 Presence of aortocoronary bypass graft: Secondary | ICD-10-CM

## 2019-11-15 ENCOUNTER — Ambulatory Visit
Admission: RE | Admit: 2019-11-15 | Discharge: 2019-11-15 | Disposition: A | Discharge status: Home / Self Care | Payer: Commercial Managed Care - PPO | Source: Ambulatory Visit | Attending: Surgery | Admitting: Surgery

## 2019-11-15 ENCOUNTER — Ambulatory Visit (INDEPENDENT_AMBULATORY_CARE_PROVIDER_SITE_OTHER): Payer: Self-pay | Admitting: Surgery

## 2019-11-15 ENCOUNTER — Other Ambulatory Visit: Payer: Self-pay

## 2019-11-15 ENCOUNTER — Encounter: Payer: Self-pay | Admitting: Surgery

## 2019-11-15 VITALS — BP 174/85 | HR 100 | Temp 97.7°F | Resp 20 | Ht 63.0 in | Wt 138.0 lb

## 2019-11-15 DIAGNOSIS — I2510 Atherosclerotic heart disease of native coronary artery without angina pectoris: Secondary | ICD-10-CM

## 2019-11-15 DIAGNOSIS — Z951 Presence of aortocoronary bypass graft: Secondary | ICD-10-CM

## 2019-11-15 NOTE — Progress Notes (Signed)
HPI: Patient returns for routine postoperative follow-up having undergone coronary artery bypass graft surgery x3 on 09/15/2019. The patient's early postoperative recovery while in the hospital was notable for an uncomplicated postoperative course. Since hospital discharge the patient reports that she was doing well initially and felt much better.  She said that over the past week she had 2 episodes of substernal chest tightness similar to the pain that she had preoperatively.  The first episode she ignored by the second episode prompted a visit to the [facility] emergency room.  She said that her troponin levels were negative and electrocardiogram was negative but she was admitted overnight for observation.  She said that she had no further chest discomfort and since she went home she has had no other episodes.  She was sent home with as needed nitroglycerin.  She said that she was told that most likely it was coronary ischemia due to some ungrafted territory.  She has not been back to see her primary cardiologist, Dr. Geraldo Pitter yet.  Since these episodes started she noted that her energy level has been decreased compared to where it was after she went home.   Current Outpatient Medications  Medication Sig Dispense Refill  . ALPRAZolam (XANAX) 0.25 MG tablet Take 1 tablet (0.25 mg total) by mouth at bedtime as needed for anxiety. 15 tablet 0  . aspirin EC 81 MG tablet Take 1 tablet (81 mg total) by mouth daily. 90 tablet 3  . buPROPion (WELLBUTRIN XL) 300 MG 24 hr tablet Take 1 tablet (300 mg total) by mouth every morning. 15 tablet 0  . clopidogrel (PLAVIX) 75 MG tablet Take 1 tablet (75 mg total) by mouth daily. 30 tablet 1  . levothyroxine (SYNTHROID) 75 MCG tablet Take 75 mcg by mouth daily before breakfast.    . losartan (COZAAR) 25 MG tablet Take 1 tablet (25 mg total) by mouth every morning. 30 tablet 1  . metoprolol tartrate (LOPRESSOR) 25 MG tablet Take 1 tablet (25 mg total) by  mouth daily. 60 tablet 1  . ondansetron (ZOFRAN) 4 MG tablet Take 1 tablet (4 mg total) by mouth every 8 (eight) hours as needed for nausea or vomiting. 20 tablet 0  . rOPINIRole (REQUIP) 1 MG tablet Take 1 mg by mouth 3 (three) times daily.     Marland Kitchen rOPINIRole (REQUIP) 4 MG tablet Take 4 mg by mouth at bedtime.    . rosuvastatin (CRESTOR) 20 MG tablet Take 1 tablet (20 mg total) by mouth daily at 6 PM. 30 tablet 1  . topiramate (TOPAMAX) 100 MG tablet Take 100 mg by mouth 2 (two) times daily.    . traMADol (ULTRAM) 50 MG tablet Take 1 tablet (50 mg total) by mouth every 12 (twelve) hours as needed (pain). 20 tablet 0  . traZODone (DESYREL) 150 MG tablet Take 150 mg by mouth at bedtime.      No current facility-administered medications for this visit.    Physical Exam: BP (!) 174/85   Pulse 100   Temp 97.7 F (36.5 C) (Skin)   Resp 20   Ht 5\' 3"  (1.6 m)   Wt 138 lb (62.6 kg)   SpO2 96%   BMI 24.45 kg/m  She looks well. Cardiac exam shows regular rate and rhythm with normal heart sounds.  There is no murmur. Lungs are clear. The chest incision is healing well and sternum is stable. There is no peripheral edema.  Diagnostic Tests:  CLINICAL DATA:  Recent  coronary artery bypass grafting  EXAM: CHEST - 2 VIEW  COMPARISON:  Chest radiograph and chest CT November 08, 2019  FINDINGS: Lungs are clear. The heart size and pulmonary vascularity are normal. No adenopathy. Patient is status post coronary artery bypass grafting. No adenopathy. There is aortic atherosclerosis. No bone lesions. There are surgical clips in the right upper quadrant.  IMPRESSION: Postoperative changes. No edema or airspace opacity. No pneumothorax. Heart size normal. Aortic Atherosclerosis (ICD10-I70.0).   Electronically Signed   By: Lowella Grip III M.D.   On: 11/15/2019 14:22   Impression:  She is about 2 months out from coronary artery bypass graft surgery and has had recurrent  substernal chest tightness similar to what she had preoperatively as well as new exertional fatigue.  It is certainly possible that this is related to coronary ischemia since she had diffusely diseased coronaries with some distal territory in the left circumflex and right coronary systems that may not be completely revascularized.  It is also possible that she could have some early graft failure.  She has had no further episodes of chest discomfort since going home from [facility].  She is planning to see Dr. Geraldo Pitter in the near future for follow-up as she should probably have a repeat cardiac catheterization to reevaluate her coronary arteries and grafts since she is at high risk for incomplete revascularization or early graft failure.  She knows that if she develops any further symptoms before getting into see Dr. Geraldo Pitter that she should come to the emergency room.  Plan:  She is going to make a follow-up appointment in the near future to see Dr. Geraldo Pitter.  I will await his assessment of the need for repeat catheterization.   Gaye Pollack, MD [facility] [phone]

## 2019-11-17 ENCOUNTER — Encounter: Payer: Self-pay | Admitting: Cardiology

## 2019-11-17 ENCOUNTER — Other Ambulatory Visit: Payer: Self-pay

## 2019-11-17 ENCOUNTER — Ambulatory Visit (INDEPENDENT_AMBULATORY_CARE_PROVIDER_SITE_OTHER): Payer: Commercial Managed Care - PPO | Admitting: Cardiology

## 2019-11-17 VITALS — BP 156/80 | HR 84 | Ht 63.0 in | Wt 142.0 lb

## 2019-11-17 DIAGNOSIS — I209 Angina pectoris, unspecified: Secondary | ICD-10-CM | POA: Insufficient documentation

## 2019-11-17 DIAGNOSIS — I10 Essential (primary) hypertension: Secondary | ICD-10-CM | POA: Diagnosis not present

## 2019-11-17 DIAGNOSIS — Z951 Presence of aortocoronary bypass graft: Secondary | ICD-10-CM

## 2019-11-17 DIAGNOSIS — I25110 Atherosclerotic heart disease of native coronary artery with unstable angina pectoris: Secondary | ICD-10-CM

## 2019-11-17 DIAGNOSIS — E088 Diabetes mellitus due to underlying condition with unspecified complications: Secondary | ICD-10-CM | POA: Diagnosis not present

## 2019-11-17 DIAGNOSIS — Z8249 Family history of ischemic heart disease and other diseases of the circulatory system: Secondary | ICD-10-CM

## 2019-11-17 DIAGNOSIS — I200 Unstable angina: Secondary | ICD-10-CM | POA: Insufficient documentation

## 2019-11-17 DIAGNOSIS — Z87891 Personal history of nicotine dependence: Secondary | ICD-10-CM

## 2019-11-17 HISTORY — DX: Angina pectoris, unspecified: I20.9

## 2019-11-17 MED ORDER — ISOSORBIDE MONONITRATE ER 60 MG PO TB24: 60.0000 mg | ORAL_TABLET | Freq: Every day | ORAL | 12 refills | Status: DC

## 2019-11-17 NOTE — Patient Instructions (Signed)
Medication Instructions:  Your physician has recommended you make the following change in your medication:   Start Imdur 60 mg daily. Hold your Losartan  *If you need a refill on your cardiac medications before your next appointment, please call your pharmacy*  Lab Work: You had a BMET and a CBC today. If you have labs (blood work) drawn today and your tests are completely normal, you will receive your results only by: Marland Kitchen MyChart Message (if you have MyChart) OR . A paper copy in the mail If you have any lab test that is abnormal or we need to change your treatment, we will call you to review the results.  Testing/Procedures:    [facility] [address] [place], [address] [place] [address] Dept: [phone] Loc: [phone]  REGINIA SLIGHT  11/17/2019  You are scheduled for a Cardiac Catheterization on Wednesday, February 24 with Dr. Shelva Majestic.  1. Please arrive at the [facility] (Main Entrance A) at [facility]: [address] [place], [place] [address] at 6:30 AM (This time is two hours before your procedure to ensure your preparation). Free valet parking service is available.   Special note: Every effort is made to have your procedure done on time. Please understand that emergencies sometimes delay scheduled procedures.  2. Diet: Do not eat solid foods after midnight.  The patient may have clear liquids until 5am upon the day of the procedure.  3. Labs: You had labs done today.   4. Medication instructions in preparation for your procedure:   Contrast Allergy: No    On the morning of your procedure, take your Plavix/Clopidogrel and any morning medicines NOT listed above.  You may use sips of water.  5. Plan for one night stay--bring personal belongings. 6. Bring a current list of your medications and current insurance cards. 7. You MUST have a responsible person to drive you  home. 8. Someone MUST be with you the first 24 hours after you arrive home or your discharge will be delayed. 9. Please wear clothes that are easy to get on and off and wear slip-on shoes.  Thank you for allowing [place] to care for you!   -- [facility]   Follow-Up: At [facility], you and your health needs are our priority.  As part of our continuing mission to provide you with exceptional heart care, we have created designated Provider Care Teams.  These Care Teams include your primary Cardiologist (physician) and Advanced Practice Providers (APPs -  Physician Assistants and Nurse Practitioners) who all work together to provide you with the care you need, when you need it.  Your next appointment:   2 week(s)  The format for your next appointment:   In Person  Provider:   Jyl Heinz, MD  Other Instructions NA

## 2019-11-17 NOTE — Progress Notes (Signed)
RTW letter and OV note from 11/15/19 faxed to [phone]. Per patient's request

## 2019-11-17 NOTE — H&P (View-Only) (Signed)
Cardiology Office Note:    Date:  11/17/2019   ID:  Meghan Terry, DOB 26-Jan-1961, MRN [id number]  PCP:  Meghan Brothers, MD  Cardiologist:  Meghan Lindau, MD   Referring MD: Meghan Brothers, MD    ASSESSMENT:    1. Angina pectoris (Meghan Terry)   2. Essential hypertension   3. Coronary artery disease involving native coronary artery of native heart with unstable angina pectoris (Meghan Terry)   4. Diabetes mellitus due to underlying condition with unspecified complications (Meghan Terry)   5. Ex-smoker   6. Family history of coronary artery disease   7. S/P CABG x 3    PLAN:    In order of problems listed above:  1. Angina pectoris: Patient symptoms are very concerning.  Sublingual nitroglycerin prescription was sent, its protocol and 911 protocol explained and the patient vocalized understanding questions were answered to the patient's satisfaction.  I told her to go to the nearest emergency room for recurrence of symptoms.  In view of her symptoms I suggested repeat coronary angiography to evaluate the symptoms and the same was suggested to her by her cardiac surgeon when she called his office.  I discussed this again with her at length and she is agreeable.  It is to be noted that she has renal insufficiency and prudent use of dye with the helpful.  I am not sure whether she needs a left ventriculography in addition to coronary angiography but I will leave the final decision to my interventional colleagues.  We will set up for this as soon as possible.  In the interim I will initiate her on isosorbide mononitrate 60 mg daily and will stop losartan just to avoid hypotension.  We will do a Chem-7 and CBC checked today. 2. Essential hypertension: Blood pressure should be fine I think she has an element of anxiety in addition to these issues she has Xanax from previous visit and she will use it on a as needed basis. 3. Mixed dyslipidemia: Diet was discussed 4. Further recommendations will be made based on the findings of  the coronary angiography.  Above evaluation review took 45 minutes.   Medication Adjustments/Labs and Tests Ordered: Current medicines are reviewed at length with the patient today.  Concerns regarding medicines are outlined above.  No orders of the defined types were placed in this encounter.  No orders of the defined types were placed in this encounter.    Chief Complaint  Patient presents with  . Follow-up    6 Weeks      History of Present Illness:    Meghan Terry is a 59 y.o. female.  Patient has past medical history of essential hypertension and dyslipidemia.  She is an ex-smoker.  She recently underwent coronary angiography with multiple vessel stenosis and underwent CABG surgery.  She complains of chest pain in her last visit.  She told me that it was sternal pain.  Now she mentions to me that she was trying to under represent what she was going through.  Subsequently she has had significant chest pain and used nitroglycerin with relief.  She has gone to the emergency room at [facility] and was treated and released and I reviewed those records extensively.  She has had anginal symptoms at home and relieved with nitroglycerin.  She has called the surgeon's office and she was told to get in touch with me.  At the time of my evaluation, the patient is alert awake oriented and in no distress.  Past Medical History:  Diagnosis Date  . Anxiety   . Coronary artery disease   . Hypertension   . Hypothyroid   . RLS (restless legs syndrome)   . Thyroid disease     Past Surgical History:  Procedure Laterality Date  . ABDOMINAL HYSTERECTOMY    . APPENDECTOMY    . CHOLECYSTECTOMY    . COLONOSCOPY    . CORONARY ARTERY BYPASS GRAFT N/A 09/15/2019   Procedure: CORONARY ARTERY BYPASS GRAFTING (CABG) times three on pump using left internal mammary artery and right and left greater saphenous veins harvested endoscopically;  Surgeon: Gaye Pollack, MD;  Location: [place] [facility];  Service:  Open Heart Surgery;  Laterality: N/A;  . KNEE ARTHROSCOPY    . LEFT HEART CATH AND CORONARY ANGIOGRAPHY N/A 09/14/2019   Procedure: LEFT HEART CATH AND CORONARY ANGIOGRAPHY;  Surgeon: [name], MD;  Location: [place] [facility];  Service: Cardiovascular;  Laterality: N/A;  . OSTEOCHONDRAL DEFECT REPAIR/RECONSTRUCTION Left 11/29/2014   Procedure: LEFT ANKLE MEDIAL MALLEOLUS OSTEOTOMY,AUTO GRAFT FROM CALCANEOUS;  OS TIBIA DENORO GRAFTING TALUS;  Surgeon: Wylene Simmer, MD;  Location: [place];  Service: Orthopedics;  Laterality: Left;  . TEE WITHOUT CARDIOVERSION N/A 09/15/2019   Procedure: TRANSESOPHAGEAL ECHOCARDIOGRAM (TEE);  Surgeon: Gaye Pollack, MD;  Location: [place];  Service: Open Heart Surgery;  Laterality: N/A;  . TUBAL LIGATION      Current Medications: Current Meds  Medication Sig  . ALPRAZolam (XANAX) 0.25 MG tablet Take 1 tablet (0.25 mg total) by mouth at bedtime as needed for anxiety.  Marland Kitchen aspirin EC 81 MG tablet Take 1 tablet (81 mg total) by mouth daily.  Marland Kitchen buPROPion (WELLBUTRIN XL) 300 MG 24 hr tablet Take 1 tablet (300 mg total) by mouth every morning.  . clopidogrel (PLAVIX) 75 MG tablet Take 1 tablet (75 mg total) by mouth daily.  Marland Kitchen levothyroxine (SYNTHROID) 75 MCG tablet Take 75 mcg by mouth daily before breakfast.  . losartan (COZAAR) 25 MG tablet Take 1 tablet (25 mg total) by mouth every morning.  . metoprolol tartrate (LOPRESSOR) 25 MG tablet Take 1 tablet (25 mg total) by mouth daily.  Marland Kitchen rOPINIRole (REQUIP) 1 MG tablet Take 1 mg by mouth 3 (three) times daily.   Marland Kitchen rOPINIRole (REQUIP) 4 MG tablet Take 4 mg by mouth at bedtime.  . rosuvastatin (CRESTOR) 20 MG tablet Take 1 tablet (20 mg total) by mouth daily at 6 PM.  . topiramate (TOPAMAX) 100 MG tablet Take 100 mg by mouth 2 (two) times daily.  . traMADol (ULTRAM) 50 MG tablet Take 1 tablet (50 mg total) by mouth every 12 (twelve) hours as needed (pain).  . traZODone (DESYREL) 150 MG tablet  Take 150 mg by mouth at bedtime.      Allergies:   Carbidopa-levodopa, Prednisone, and Venlafaxine   Social History   Socioeconomic History  . Marital status: Married    Spouse name: Not on file  . Number of children: Not on file  . Years of education: Not on file  . Highest education level: Not on file  Occupational History  . Not on file  Tobacco Use  . Smoking status: Former Research scientist (life sciences)  . Smokeless tobacco: Never Used  Substance and Sexual Activity  . Alcohol use: No  . Drug use: No  . Sexual activity: Not on file    Comment: e-cig  Other Topics Concern  . Not on file  Social History Narrative  . Not on  file   Social Determinants of Health   Financial Resource Strain:   . Difficulty of Paying Living Expenses: Not on file  Food Insecurity:   . Worried About Charity fundraiser in the Last Year: Not on file  . Ran Out of Food in the Last Year: Not on file  Transportation Needs:   . Lack of Transportation (Medical): Not on file  . Lack of Transportation (Non-Medical): Not on file  Physical Activity:   . Days of Exercise per Week: Not on file  . Minutes of Exercise per Session: Not on file  Stress:   . Feeling of Stress : Not on file  Social Connections:   . Frequency of Communication with Friends and Family: Not on file  . Frequency of Social Gatherings with Friends and Family: Not on file  . Attends Religious Services: Not on file  . Active Member of Clubs or Organizations: Not on file  . Attends Archivist Meetings: Not on file  . Marital Status: Not on file     Family History: The patient's family history includes Asthma in her mother; COPD in her mother; Congestive Heart Failure in her father; Diabetes in her mother; Macular degeneration in her mother.  ROS:   Please see the history of present illness.    All other systems reviewed and are negative.  EKGs/Labs/Other Studies Reviewed:    The following studies were reviewed today: I reviewed  [facility] records extensively.  Echocardiogram report also was reviewed and revealed preserved systolic function.   Recent Labs: 09/16/2019: Magnesium 2.4 09/17/2019: ALT 14 10/30/2019: BUN 20; Creatinine, Ser 1.49; Hemoglobin 12.4; Platelets 313; Potassium 4.4; Sodium 139  Recent Lipid Panel    Component Value Date/Time   CHOL 97 09/18/2019 0355   TRIG 118 09/18/2019 0355   HDL 29 (L) 09/18/2019 0355   CHOLHDL 3.3 09/18/2019 0355   VLDL 24 09/18/2019 0355   LDLCALC 44 09/18/2019 0355    Physical Exam:    VS:  BP (!) 156/80   Pulse 84   Ht 5\' 3"  (1.6 m)   Wt 142 lb (64.4 kg)   SpO2 99%   BMI 25.15 kg/m     Wt Readings from Last 3 Encounters:  11/17/19 142 lb (64.4 kg)  11/15/19 138 lb (62.6 kg)  10/30/19 138 lb (62.6 kg)     GEN: Patient is in no acute distress HEENT: Normal NECK: No JVD; No carotid bruits LYMPHATICS: No lymphadenopathy CARDIAC: Hear sounds regular, 2/6 systolic murmur at the apex. RESPIRATORY:  Clear to auscultation without rales, wheezing or rhonchi  ABDOMEN: Soft, non-tender, non-distended MUSCULOSKELETAL:  No edema; No deformity  SKIN: Warm and dry NEUROLOGIC:  Alert and oriented x 3 PSYCHIATRIC:  Normal affect   Signed, Meghan Lindau, MD  11/17/2019 1:46 PM    [facility]

## 2019-11-17 NOTE — Progress Notes (Signed)
Cardiology Office Note:    Date:  11/17/2019   ID:  Meghan Terry, DOB 1960-12-09, MRN [id number]  PCP:  Garwin Brothers, MD  Cardiologist:  Jenean Lindau, MD   Referring MD: Garwin Brothers, MD    ASSESSMENT:    1. Angina pectoris ([place])   2. Essential hypertension   3. Coronary artery disease involving native coronary artery of native heart with unstable angina pectoris ([place])   4. Diabetes mellitus due to underlying condition with unspecified complications ([place])   5. Ex-smoker   6. Family history of coronary artery disease   7. S/P CABG x 3    PLAN:    In order of problems listed above:  1. Angina pectoris: Patient symptoms are very concerning.  Sublingual nitroglycerin prescription was sent, its protocol and 911 protocol explained and the patient vocalized understanding questions were answered to the patient's satisfaction.  I told her to go to the nearest emergency room for recurrence of symptoms.  In view of her symptoms I suggested repeat coronary angiography to evaluate the symptoms and the same was suggested to her by her cardiac surgeon when she called his office.  I discussed this again with her at length and she is agreeable.  It is to be noted that she has renal insufficiency and prudent use of dye with the helpful.  I am not sure whether she needs a left ventriculography in addition to coronary angiography but I will leave the final decision to my interventional colleagues.  We will set up for this as soon as possible.  In the interim I will initiate her on isosorbide mononitrate 60 mg daily and will stop losartan just to avoid hypotension.  We will do a Chem-7 and CBC checked today. 2. Essential hypertension: Blood pressure should be fine I think she has an element of anxiety in addition to these issues she has Xanax from previous visit and she will use it on a as needed basis. 3. Mixed dyslipidemia: Diet was discussed 4. Further recommendations will be made based on the findings of  the coronary angiography.  Above evaluation review took 45 minutes.   Medication Adjustments/Labs and Tests Ordered: Current medicines are reviewed at length with the patient today.  Concerns regarding medicines are outlined above.  No orders of the defined types were placed in this encounter.  No orders of the defined types were placed in this encounter.    Chief Complaint  Patient presents with  . Follow-up    6 Weeks      History of Present Illness:    Meghan Terry is a 59 y.o. female.  Patient has past medical history of essential hypertension and dyslipidemia.  She is an ex-smoker.  She recently underwent coronary angiography with multiple vessel stenosis and underwent CABG surgery.  She complains of chest pain in her last visit.  She told me that it was sternal pain.  Now she mentions to me that she was trying to under represent what she was going through.  Subsequently she has had significant chest pain and used nitroglycerin with relief.  She has gone to the emergency room at [facility] and was treated and released and I reviewed those records extensively.  She has had anginal symptoms at home and relieved with nitroglycerin.  She has called the surgeon's office and she was told to get in touch with me.  At the time of my evaluation, the patient is alert awake oriented and in no distress.  Past Medical History:  Diagnosis Date  . Anxiety   . Coronary artery disease   . Hypertension   . Hypothyroid   . RLS (restless legs syndrome)   . Thyroid disease     Past Surgical History:  Procedure Laterality Date  . ABDOMINAL HYSTERECTOMY    . APPENDECTOMY    . CHOLECYSTECTOMY    . COLONOSCOPY    . CORONARY ARTERY BYPASS GRAFT N/A 09/15/2019   Procedure: CORONARY ARTERY BYPASS GRAFTING (CABG) times three on pump using left internal mammary artery and right and left greater saphenous veins harvested endoscopically;  Surgeon: Gaye Pollack, MD;  Location: [place] OR;  Service:  Open Heart Surgery;  Laterality: N/A;  . KNEE ARTHROSCOPY    . LEFT HEART CATH AND CORONARY ANGIOGRAPHY N/A 09/14/2019   Procedure: LEFT HEART CATH AND CORONARY ANGIOGRAPHY;  Surgeon: [name], MD;  Location: [place] [facility];  Service: Cardiovascular;  Laterality: N/A;  . OSTEOCHONDRAL DEFECT REPAIR/RECONSTRUCTION Left 11/29/2014   Procedure: LEFT ANKLE MEDIAL MALLEOLUS OSTEOTOMY,AUTO GRAFT FROM CALCANEOUS;  OS TIBIA DENORO GRAFTING TALUS;  Surgeon: Wylene Simmer, MD;  Location: [place];  Service: Orthopedics;  Laterality: Left;  . TEE WITHOUT CARDIOVERSION N/A 09/15/2019   Procedure: TRANSESOPHAGEAL ECHOCARDIOGRAM (TEE);  Surgeon: Gaye Pollack, MD;  Location: [place];  Service: Open Heart Surgery;  Laterality: N/A;  . TUBAL LIGATION      Current Medications: Current Meds  Medication Sig  . ALPRAZolam (XANAX) 0.25 MG tablet Take 1 tablet (0.25 mg total) by mouth at bedtime as needed for anxiety.  Marland Kitchen aspirin EC 81 MG tablet Take 1 tablet (81 mg total) by mouth daily.  Marland Kitchen buPROPion (WELLBUTRIN XL) 300 MG 24 hr tablet Take 1 tablet (300 mg total) by mouth every morning.  . clopidogrel (PLAVIX) 75 MG tablet Take 1 tablet (75 mg total) by mouth daily.  Marland Kitchen levothyroxine (SYNTHROID) 75 MCG tablet Take 75 mcg by mouth daily before breakfast.  . losartan (COZAAR) 25 MG tablet Take 1 tablet (25 mg total) by mouth every morning.  . metoprolol tartrate (LOPRESSOR) 25 MG tablet Take 1 tablet (25 mg total) by mouth daily.  Marland Kitchen rOPINIRole (REQUIP) 1 MG tablet Take 1 mg by mouth 3 (three) times daily.   Marland Kitchen rOPINIRole (REQUIP) 4 MG tablet Take 4 mg by mouth at bedtime.  . rosuvastatin (CRESTOR) 20 MG tablet Take 1 tablet (20 mg total) by mouth daily at 6 PM.  . topiramate (TOPAMAX) 100 MG tablet Take 100 mg by mouth 2 (two) times daily.  . traMADol (ULTRAM) 50 MG tablet Take 1 tablet (50 mg total) by mouth every 12 (twelve) hours as needed (pain).  . traZODone (DESYREL) 150 MG tablet  Take 150 mg by mouth at bedtime.      Allergies:   Carbidopa-levodopa, Prednisone, and Venlafaxine   Social History   Socioeconomic History  . Marital status: Married    Spouse name: Not on file  . Number of children: Not on file  . Years of education: Not on file  . Highest education level: Not on file  Occupational History  . Not on file  Tobacco Use  . Smoking status: Former Research scientist (life sciences)  . Smokeless tobacco: Never Used  Substance and Sexual Activity  . Alcohol use: No  . Drug use: No  . Sexual activity: Not on file    Comment: e-cig  Other Topics Concern  . Not on file  Social History Narrative  . Not on  file   Social Determinants of Health   Financial Resource Strain:   . Difficulty of Paying Living Expenses: Not on file  Food Insecurity:   . Worried About Charity fundraiser in the Last Year: Not on file  . Ran Out of Food in the Last Year: Not on file  Transportation Needs:   . Lack of Transportation (Medical): Not on file  . Lack of Transportation (Non-Medical): Not on file  Physical Activity:   . Days of Exercise per Week: Not on file  . Minutes of Exercise per Session: Not on file  Stress:   . Feeling of Stress : Not on file  Social Connections:   . Frequency of Communication with Friends and Family: Not on file  . Frequency of Social Gatherings with Friends and Family: Not on file  . Attends Religious Services: Not on file  . Active Member of Clubs or Organizations: Not on file  . Attends Archivist Meetings: Not on file  . Marital Status: Not on file     Family History: The patient's family history includes Asthma in her mother; COPD in her mother; Congestive Heart Failure in her father; Diabetes in her mother; Macular degeneration in her mother.  ROS:   Please see the history of present illness.    All other systems reviewed and are negative.  EKGs/Labs/Other Studies Reviewed:    The following studies were reviewed today: I reviewed  [facility] records extensively.  Echocardiogram report also was reviewed and revealed preserved systolic function.   Recent Labs: 09/16/2019: Magnesium 2.4 09/17/2019: ALT 14 10/30/2019: BUN 20; Creatinine, Ser 1.49; Hemoglobin 12.4; Platelets 313; Potassium 4.4; Sodium 139  Recent Lipid Panel    Component Value Date/Time   CHOL 97 09/18/2019 0355   TRIG 118 09/18/2019 0355   HDL 29 (L) 09/18/2019 0355   CHOLHDL 3.3 09/18/2019 0355   VLDL 24 09/18/2019 0355   LDLCALC 44 09/18/2019 0355    Physical Exam:    VS:  BP (!) 156/80   Pulse 84   Ht 5\' 3"  (1.6 m)   Wt 142 lb (64.4 kg)   SpO2 99%   BMI 25.15 kg/m     Wt Readings from Last 3 Encounters:  11/17/19 142 lb (64.4 kg)  11/15/19 138 lb (62.6 kg)  10/30/19 138 lb (62.6 kg)     GEN: Patient is in no acute distress HEENT: Normal NECK: No JVD; No carotid bruits LYMPHATICS: No lymphadenopathy CARDIAC: Hear sounds regular, 2/6 systolic murmur at the apex. RESPIRATORY:  Clear to auscultation without rales, wheezing or rhonchi  ABDOMEN: Soft, non-tender, non-distended MUSCULOSKELETAL:  No edema; No deformity  SKIN: Warm and dry NEUROLOGIC:  Alert and oriented x 3 PSYCHIATRIC:  Normal affect   Signed, Jenean Lindau, MD  11/17/2019 1:46 PM    [facility]

## 2019-11-18 LAB — CBC WITH DIFFERENTIAL/PLATELET
Basophils Absolute: 0 10*3/uL (ref 0.0–0.2)
Basos: 1 %
EOS (ABSOLUTE): 0.2 10*3/uL (ref 0.0–0.4)
Eos: 3 %
Hematocrit: 35.7 % (ref 34.0–46.6)
Hemoglobin: 11.8 g/dL (ref 11.1–15.9)
Immature Grans (Abs): 0 10*3/uL (ref 0.0–0.1)
Immature Granulocytes: 0 %
Lymphocytes Absolute: 2.1 10*3/uL (ref 0.7–3.1)
Lymphs: 28 %
MCH: 28.9 pg (ref 26.6–33.0)
MCHC: 33.1 g/dL (ref 31.5–35.7)
MCV: 88 fL (ref 79–97)
Monocytes Absolute: 0.5 10*3/uL (ref 0.1–0.9)
Monocytes: 7 %
Neutrophils Absolute: 4.7 10*3/uL (ref 1.4–7.0)
Neutrophils: 61 %
Platelets: 316 10*3/uL (ref 150–450)
RBC: 4.08 x10E6/uL (ref 3.77–5.28)
RDW: 12.9 % (ref 11.7–15.4)
WBC: 7.6 10*3/uL (ref 3.4–10.8)

## 2019-11-18 LAB — BASIC METABOLIC PANEL
BUN/Creatinine Ratio: 19 (ref 12–28)
BUN: 28 mg/dL — ABNORMAL HIGH (ref 8–27)
CO2: 19 mmol/L — ABNORMAL LOW (ref 20–29)
Calcium: 9.3 mg/dL (ref 8.7–10.3)
Chloride: 109 mmol/L — ABNORMAL HIGH (ref 96–106)
Creatinine, Ser: 1.46 mg/dL — ABNORMAL HIGH (ref 0.57–1.00)
GFR calc Af Amer: 45 mL/min/{1.73_m2} — ABNORMAL LOW (ref >59)
GFR calc non Af Amer: 39 mL/min/{1.73_m2} — ABNORMAL LOW (ref >59)
Glucose: 119 mg/dL — ABNORMAL HIGH (ref 65–99)
Potassium: 4.3 mmol/L (ref 3.5–5.2)
Sodium: 142 mmol/L (ref 134–144)

## 2019-11-20 ENCOUNTER — Other Ambulatory Visit (HOSPITAL_COMMUNITY)
Admission: RE | Admit: 2019-11-20 | Discharge: 2019-11-20 | Disposition: A | Discharge status: Home / Self Care | Payer: Commercial Managed Care - PPO | Source: Ambulatory Visit | Attending: Cardiovascular Disease | Admitting: Cardiovascular Disease

## 2019-11-20 ENCOUNTER — Telehealth: Payer: Self-pay | Admitting: *Deleted

## 2019-11-20 ENCOUNTER — Telehealth: Payer: Self-pay

## 2019-11-20 LAB — SARS CORONAVIRUS 2 (TAT 6-24 HRS): SARS Coronavirus 2: NEGATIVE

## 2019-11-20 NOTE — Telephone Encounter (Signed)
Message left for pt to call the office for results.

## 2019-11-20 NOTE — Telephone Encounter (Signed)
Pt contacted pre-catheterization scheduled at [facility] for: Wednesday November 22, 2019 11:30 AM Verified arrival time and place: [facility]) at: 6:30 AM-pre procedure hydration  No solid food after midnight prior to cath, clear liquids until 5 AM day of procedure. Contrast allergy: no   AM meds can be  taken pre-cath with sip of water including: ASA 81 mg Plavix 75 mg  Confirmed patient has responsible adult to drive home post procedure and observe 24 hours after arriving home: yes  Currently, due to Covid-19 pandemic, only one person will be allowed with patient. Must be the same person for patient's entire stay and will be required to wear a mask. They will be asked to wait in the waiting room for the duration of the patient's stay.  Patients are required to wear a mask when they enter the hospital.      COVID-19 Pre-Screening Questions:  . In the past 7 to 10 days have you had a cough,  shortness of breath, headache, congestion, fever (100 or greater) body aches, chills, sore throat, or sudden loss of taste or sense of smell? no . Have you been around anyone with known Covid 19 in the past 7-10 days? no . Have you been around anyone who is awaiting Covid 19 test results in the past 7 to 10 days? no . Have you been around anyone who has been exposed to Covid 19, or has mentioned symptoms of Covid 19 within the past 7 to 10 days? no   I reviewed procedure/mask/visitor instructions, COVID-19 screening questions with patient, she verbalized understanding, thanked me for call.

## 2019-11-21 ENCOUNTER — Other Ambulatory Visit: Payer: Self-pay | Admitting: Physician Assistant

## 2019-11-21 ENCOUNTER — Other Ambulatory Visit: Payer: Self-pay | Admitting: Cardiology

## 2019-11-21 ENCOUNTER — Other Ambulatory Visit (HOSPITAL_BASED_OUTPATIENT_CLINIC_OR_DEPARTMENT_OTHER): Payer: Self-pay | Admitting: Student

## 2019-11-22 ENCOUNTER — Other Ambulatory Visit: Payer: Self-pay

## 2019-11-22 ENCOUNTER — Encounter (HOSPITAL_COMMUNITY)
Admission: AD | Disposition: A | Discharge status: Home / Self Care | Payer: Self-pay | Source: Home / Self Care | Attending: Cardiovascular Disease

## 2019-11-22 ENCOUNTER — Inpatient Hospital Stay (HOSPITAL_COMMUNITY)
Admission: AD | Admit: 2019-11-22 | Discharge: 2019-11-24 | DRG: 247 | Disposition: A | Discharge status: Home / Self Care | Payer: Commercial Managed Care - PPO | Attending: Cardiovascular Disease | Admitting: Cardiovascular Disease

## 2019-11-22 DIAGNOSIS — E1122 Type 2 diabetes mellitus with diabetic chronic kidney disease: Secondary | ICD-10-CM | POA: Diagnosis present

## 2019-11-22 DIAGNOSIS — Z7902 Long term (current) use of antithrombotics/antiplatelets: Secondary | ICD-10-CM | POA: Diagnosis not present

## 2019-11-22 DIAGNOSIS — Z20822 Contact with and (suspected) exposure to covid-19: Secondary | ICD-10-CM | POA: Diagnosis present

## 2019-11-22 DIAGNOSIS — Z825 Family history of asthma and other chronic lower respiratory diseases: Secondary | ICD-10-CM

## 2019-11-22 DIAGNOSIS — I25119 Atherosclerotic heart disease of native coronary artery with unspecified angina pectoris: Secondary | ICD-10-CM | POA: Diagnosis not present

## 2019-11-22 DIAGNOSIS — Z833 Family history of diabetes mellitus: Secondary | ICD-10-CM | POA: Diagnosis not present

## 2019-11-22 DIAGNOSIS — F419 Anxiety disorder, unspecified: Secondary | ICD-10-CM | POA: Diagnosis present

## 2019-11-22 DIAGNOSIS — Z79899 Other long term (current) drug therapy: Secondary | ICD-10-CM

## 2019-11-22 DIAGNOSIS — I200 Unstable angina: Secondary | ICD-10-CM | POA: Diagnosis not present

## 2019-11-22 DIAGNOSIS — Z951 Presence of aortocoronary bypass graft: Secondary | ICD-10-CM

## 2019-11-22 DIAGNOSIS — E088 Diabetes mellitus due to underlying condition with unspecified complications: Secondary | ICD-10-CM

## 2019-11-22 DIAGNOSIS — E785 Hyperlipidemia, unspecified: Secondary | ICD-10-CM | POA: Diagnosis present

## 2019-11-22 DIAGNOSIS — Z7982 Long term (current) use of aspirin: Secondary | ICD-10-CM | POA: Diagnosis not present

## 2019-11-22 DIAGNOSIS — Z955 Presence of coronary angioplasty implant and graft: Secondary | ICD-10-CM | POA: Diagnosis not present

## 2019-11-22 DIAGNOSIS — I10 Essential (primary) hypertension: Secondary | ICD-10-CM

## 2019-11-22 DIAGNOSIS — I209 Angina pectoris, unspecified: Secondary | ICD-10-CM

## 2019-11-22 DIAGNOSIS — E039 Hypothyroidism, unspecified: Secondary | ICD-10-CM | POA: Diagnosis present

## 2019-11-22 DIAGNOSIS — I25710 Atherosclerosis of autologous vein coronary artery bypass graft(s) with unstable angina pectoris: Principal | ICD-10-CM | POA: Diagnosis present

## 2019-11-22 DIAGNOSIS — I25110 Atherosclerotic heart disease of native coronary artery with unstable angina pectoris: Secondary | ICD-10-CM | POA: Diagnosis present

## 2019-11-22 DIAGNOSIS — E119 Type 2 diabetes mellitus without complications: Secondary | ICD-10-CM | POA: Diagnosis present

## 2019-11-22 DIAGNOSIS — Z79890 Hormone replacement therapy (postmenopausal): Secondary | ICD-10-CM

## 2019-11-22 DIAGNOSIS — I2582 Chronic total occlusion of coronary artery: Secondary | ICD-10-CM | POA: Diagnosis present

## 2019-11-22 DIAGNOSIS — Z8249 Family history of ischemic heart disease and other diseases of the circulatory system: Secondary | ICD-10-CM | POA: Diagnosis not present

## 2019-11-22 DIAGNOSIS — N183 Chronic kidney disease, stage 3 unspecified: Secondary | ICD-10-CM | POA: Diagnosis present

## 2019-11-22 DIAGNOSIS — N1832 Chronic kidney disease, stage 3b: Secondary | ICD-10-CM | POA: Diagnosis present

## 2019-11-22 DIAGNOSIS — E782 Mixed hyperlipidemia: Secondary | ICD-10-CM | POA: Diagnosis present

## 2019-11-22 DIAGNOSIS — I129 Hypertensive chronic kidney disease with stage 1 through stage 4 chronic kidney disease, or unspecified chronic kidney disease: Secondary | ICD-10-CM | POA: Diagnosis present

## 2019-11-22 DIAGNOSIS — Z87891 Personal history of nicotine dependence: Secondary | ICD-10-CM | POA: Diagnosis not present

## 2019-11-22 DIAGNOSIS — G2581 Restless legs syndrome: Secondary | ICD-10-CM | POA: Diagnosis present

## 2019-11-22 DIAGNOSIS — I25719 Atherosclerosis of autologous vein coronary artery bypass graft(s) with unspecified angina pectoris: Secondary | ICD-10-CM | POA: Diagnosis not present

## 2019-11-22 HISTORY — PX: LEFT HEART CATH AND CORS/GRAFTS ANGIOGRAPHY: CATH118250

## 2019-11-22 HISTORY — PX: CARDIAC CATHETERIZATION: SHX172

## 2019-11-22 LAB — GLUCOSE, CAPILLARY: Glucose-Capillary: 98 mg/dL (ref 70–99)

## 2019-11-22 SURGERY — LEFT HEART CATH AND CORS/GRAFTS ANGIOGRAPHY
Anesthesia: LOCAL

## 2019-11-22 MED ORDER — ALPRAZOLAM 0.25 MG PO TABS: 0.2500 mg | ORAL_TABLET | Freq: Every evening | ORAL | Status: DC | PRN

## 2019-11-22 MED ORDER — SODIUM CHLORIDE 0.9 % IV SOLN: INTRAVENOUS | Status: DC

## 2019-11-22 MED ORDER — MIDAZOLAM HCL 2 MG/2ML IJ SOLN
INTRAMUSCULAR | Status: AC
  Filled 2019-11-22: qty 2

## 2019-11-22 MED ORDER — CLOPIDOGREL BISULFATE 75 MG PO TABS
75.0000 mg | ORAL_TABLET | Freq: Every day | ORAL | Status: DC
  Administered 2019-11-23: 75 mg via ORAL
  Filled 2019-11-22: qty 1

## 2019-11-22 MED ORDER — ISOSORBIDE MONONITRATE ER 30 MG PO TB24
30.0000 mg | ORAL_TABLET | Freq: Every day | ORAL | Status: DC
  Filled 2019-11-22: qty 1

## 2019-11-22 MED ORDER — SODIUM CHLORIDE 0.9% FLUSH
3.0000 mL | Freq: Two times a day (BID) | INTRAVENOUS | Status: DC
  Administered 2019-11-22 (×2): 3 mL via INTRAVENOUS

## 2019-11-22 MED ORDER — ACETAMINOPHEN 325 MG PO TABS
650.0000 mg | ORAL_TABLET | ORAL | Status: DC | PRN
  Administered 2019-11-22 – 2019-11-23 (×2): 650 mg via ORAL
  Filled 2019-11-22 (×2): qty 2

## 2019-11-22 MED ORDER — HEPARIN (PORCINE) IN NACL 1000-0.9 UT/500ML-% IV SOLN
INTRAVENOUS | Status: DC | PRN
  Administered 2019-11-22 (×2): 500 mL

## 2019-11-22 MED ORDER — TRAZODONE HCL 50 MG PO TABS
150.0000 mg | ORAL_TABLET | Freq: Every day | ORAL | Status: DC
  Administered 2019-11-22 – 2019-11-23 (×2): 150 mg via ORAL
  Filled 2019-11-22 (×2): qty 1

## 2019-11-22 MED ORDER — NITROGLYCERIN 1 MG/10 ML FOR IR/CATH LAB
INTRA_ARTERIAL | Status: AC
  Filled 2019-11-22: qty 10

## 2019-11-22 MED ORDER — TOPIRAMATE 100 MG PO TABS
100.0000 mg | ORAL_TABLET | Freq: Every day | ORAL | Status: DC
  Administered 2019-11-22 – 2019-11-24 (×3): 100 mg via ORAL
  Filled 2019-11-22 (×2): qty 4
  Filled 2019-11-22: qty 1

## 2019-11-22 MED ORDER — SODIUM CHLORIDE 0.9% FLUSH
3.0000 mL | Freq: Two times a day (BID) | INTRAVENOUS | Status: DC
  Administered 2019-11-22: 16:00:00 3 mL via INTRAVENOUS

## 2019-11-22 MED ORDER — ASPIRIN 81 MG PO CHEW
81.0000 mg | CHEWABLE_TABLET | ORAL | Status: AC
  Administered 2019-11-23: 81 mg via ORAL
  Filled 2019-11-22: qty 1

## 2019-11-22 MED ORDER — HEPARIN (PORCINE) IN NACL 1000-0.9 UT/500ML-% IV SOLN
INTRAVENOUS | Status: AC
  Filled 2019-11-22: qty 1000

## 2019-11-22 MED ORDER — FENTANYL CITRATE (PF) 100 MCG/2ML IJ SOLN
INTRAMUSCULAR | Status: DC | PRN
  Administered 2019-11-22 (×2): 25 ug via INTRAVENOUS

## 2019-11-22 MED ORDER — LIDOCAINE HCL (PF) 1 % IJ SOLN
INTRAMUSCULAR | Status: AC
  Filled 2019-11-22: qty 30

## 2019-11-22 MED ORDER — RANOLAZINE ER 500 MG PO TB12
500.0000 mg | ORAL_TABLET | Freq: Two times a day (BID) | ORAL | Status: DC
  Administered 2019-11-22 – 2019-11-24 (×5): 500 mg via ORAL
  Filled 2019-11-22 (×5): qty 1

## 2019-11-22 MED ORDER — SODIUM CHLORIDE 0.9% FLUSH: 3.0000 mL | Freq: Two times a day (BID) | INTRAVENOUS | Status: DC

## 2019-11-22 MED ORDER — SODIUM CHLORIDE 0.9 % IV SOLN: 250.0000 mL | INTRAVENOUS | Status: DC | PRN

## 2019-11-22 MED ORDER — IOHEXOL 350 MG/ML SOLN
INTRAVENOUS | Status: AC
  Filled 2019-11-22: qty 1

## 2019-11-22 MED ORDER — MIDAZOLAM HCL 2 MG/2ML IJ SOLN
INTRAMUSCULAR | Status: DC | PRN
  Administered 2019-11-22: 1 mg via INTRAVENOUS
  Administered 2019-11-22: 2 mg via INTRAVENOUS

## 2019-11-22 MED ORDER — ASPIRIN 81 MG PO CHEW: 81.0000 mg | CHEWABLE_TABLET | Freq: Every day | ORAL | Status: DC

## 2019-11-22 MED ORDER — SODIUM CHLORIDE 0.9% FLUSH: 3.0000 mL | INTRAVENOUS | Status: DC | PRN

## 2019-11-22 MED ORDER — NITROGLYCERIN 1 MG/10 ML FOR IR/CATH LAB
INTRA_ARTERIAL | Status: DC | PRN
  Administered 2019-11-22: 100 ug via INTRACORONARY
  Administered 2019-11-22: 200 ug via INTRACORONARY

## 2019-11-22 MED ORDER — LABETALOL HCL 5 MG/ML IV SOLN: 10.0000 mg | INTRAVENOUS | Status: AC | PRN

## 2019-11-22 MED ORDER — IOHEXOL 350 MG/ML SOLN
INTRAVENOUS | Status: DC | PRN
  Administered 2019-11-22: 12:00:00 85 mL via INTRA_ARTERIAL

## 2019-11-22 MED ORDER — SODIUM CHLORIDE 0.9 % WEIGHT BASED INFUSION
3.0000 mL/kg/h | INTRAVENOUS | Status: DC
  Administered 2019-11-22: 3 mL/kg/h via INTRAVENOUS

## 2019-11-22 MED ORDER — NITROGLYCERIN IN D5W 200-5 MCG/ML-% IV SOLN
0.0000 ug/min | INTRAVENOUS | Status: DC
  Administered 2019-11-23: 14:00:00 5 ug/min via INTRAVENOUS
  Filled 2019-11-22: qty 250

## 2019-11-22 MED ORDER — HYDRALAZINE HCL 20 MG/ML IJ SOLN: 10.0000 mg | INTRAMUSCULAR | Status: AC | PRN

## 2019-11-22 MED ORDER — ROPINIROLE HCL 1 MG PO TABS
4.0000 mg | ORAL_TABLET | Freq: Every day | ORAL | Status: DC
  Administered 2019-11-22 – 2019-11-23 (×2): 4 mg via ORAL
  Filled 2019-11-22 (×3): qty 4

## 2019-11-22 MED ORDER — LIDOCAINE HCL (PF) 1 % IJ SOLN
INTRAMUSCULAR | Status: DC | PRN
  Administered 2019-11-22: 20 mL

## 2019-11-22 MED ORDER — ASPIRIN 81 MG PO CHEW: 81.0000 mg | CHEWABLE_TABLET | ORAL | Status: DC

## 2019-11-22 MED ORDER — ROSUVASTATIN CALCIUM 20 MG PO TABS
40.0000 mg | ORAL_TABLET | Freq: Every day | ORAL | Status: DC
  Administered 2019-11-22: 18:00:00 40 mg via ORAL
  Filled 2019-11-22: qty 2

## 2019-11-22 MED ORDER — FENTANYL CITRATE (PF) 100 MCG/2ML IJ SOLN
INTRAMUSCULAR | Status: AC
  Filled 2019-11-22: qty 2

## 2019-11-22 MED ORDER — SODIUM CHLORIDE 0.9 % WEIGHT BASED INFUSION
1.0000 mL/kg/h | INTRAVENOUS | Status: DC
  Administered 2019-11-22: 250 mL via INTRAVENOUS
  Administered 2019-11-22: 1 mL/kg/h via INTRAVENOUS

## 2019-11-22 MED ORDER — NITROGLYCERIN 0.4 MG SL SUBL
0.4000 mg | SUBLINGUAL_TABLET | SUBLINGUAL | Status: DC | PRN
  Administered 2019-11-22 – 2019-11-24 (×4): 0.4 mg via SUBLINGUAL
  Filled 2019-11-22 (×4): qty 1

## 2019-11-22 MED ORDER — MORPHINE SULFATE (PF) 2 MG/ML IV SOLN
2.0000 mg | INTRAVENOUS | Status: DC | PRN
  Administered 2019-11-23: 2 mg via INTRAVENOUS
  Filled 2019-11-22 (×2): qty 1

## 2019-11-22 MED ORDER — ONDANSETRON HCL 4 MG/2ML IJ SOLN: 4.0000 mg | Freq: Four times a day (QID) | INTRAMUSCULAR | Status: DC | PRN

## 2019-11-22 SURGICAL SUPPLY — 7 items
CATH INFINITI 5FR MULTPACK ANG (CATHETERS) ×1 IMPLANT
CLOSURE MYNX CONTROL 5F (Vascular Products) ×1 IMPLANT
KIT HEART LEFT (KITS) ×2 IMPLANT
PACK CARDIAC CATHETERIZATION (CUSTOM PROCEDURE TRAY) ×2 IMPLANT
SHEATH PINNACLE 5F 10CM (SHEATH) ×1 IMPLANT
TRANSDUCER W/STOPCOCK (MISCELLANEOUS) ×2 IMPLANT
WIRE EMERALD 3MM-J .035X150CM (WIRE) ×1 IMPLANT

## 2019-11-22 NOTE — Significant Event (Addendum)
Rapid Response Event Note  Overview:Called d/t 10/10 chest pain. Pt had cardiac cath today with complex anatomy. Plan for cardiac cath again in AM. Time Called: 1922 Arrival Time: 1930 Event Type: Cardiac  Initial Focused Assessment: Pt laying in bed with hand over heart, SOB. Pt moaning in pain. Lungs clear t/o, skin warm and dry. T-97.9, HR-93, BP-161/72, RR-22, SpO2-100% on RA.  Interventions: EKG(PTA RRT)-NSR NTG SL 0.4mg  given x 1 at 1934-pt's pain steadily decreased and SOB subsided. Pt says she feels a lot better.   Plan of Care (if not transferred): Continue to monitor pt. Morphine 2mg  IV q4h prn and ntg gtt ordered if pain reoccurs and SL NTG does not help. Please call RRT if further assistance needed.  Event Summary: Name of Physician Notified: Dr. Harl Bowie at (PTA RRT)    at    Outcome: Stayed in room and stabalized  Event End Time: 1944  Dillard Essex

## 2019-11-22 NOTE — Interval H&P Note (Signed)
Cath Lab Visit (complete for each Cath Lab visit)  Clinical Evaluation Leading to the Procedure:   ACS: No.  Non-ACS:    Anginal Classification: CCS III  Anti-ischemic medical therapy: Maximal Therapy (2 or more classes of medications)  Non-Invasive Test Results: No non-invasive testing performed  Prior CABG: Previous CABG      History and Physical Interval Note:  11/22/2019 10:30 AM  Meghan Terry  has presented today for surgery, with the diagnosis of angina.  The various methods of treatment have been discussed with the patient and family. After consideration of risks, benefits and other options for treatment, the patient has consented to  Procedure(s): LEFT HEART CATH AND CORS/GRAFTS ANGIOGRAPHY (N/A) as a surgical intervention.  The patient's history has been reviewed, patient examined, no change in status, stable for surgery.  I have reviewed the patient's chart and labs.  Questions were answered to the patient's satisfaction.     Shelva Majestic

## 2019-11-22 NOTE — Progress Notes (Signed)
Called about 10/10 chest pain by nursing staff. EKG SR without acute ischemic changes. Cath today reviewed, complex anatomy, Dr Claiborne Billings discussing with colleagues whether interventional target. Will write for SL NG, prn morphine, and NG drip for pain control and follow symptoms. She is hemodynamically stable.    Carlyle Dolly MD

## 2019-11-22 NOTE — Progress Notes (Signed)
Upon entering the room for shift report patient c/o of 10/10 chest pain. Pt short of breath. EKG obtained. Placed on 2L [place]. No PRN medication available to give. MD paged.  While awaiting a return call, the patient became increasingly uncomfortable and still SOB. Rapid Response called. Will come to see patient. Dr. Harl Bowie reviewed EKG and VS. Ordered SL nitro and nitro drip. Stated to give SL nitro and if pain not relieved start nitro drip.  Rapid response to bedside. 1 SL nitro given and pain subsided to zero.

## 2019-11-23 ENCOUNTER — Inpatient Hospital Stay (HOSPITAL_COMMUNITY)
Admission: AD | Disposition: A | Discharge status: Home / Self Care | Payer: Self-pay | Source: Home / Self Care | Attending: Cardiovascular Disease

## 2019-11-23 DIAGNOSIS — I10 Essential (primary) hypertension: Secondary | ICD-10-CM

## 2019-11-23 DIAGNOSIS — I200 Unstable angina: Secondary | ICD-10-CM

## 2019-11-23 HISTORY — PX: CORONARY STENT INTERVENTION: CATH118234

## 2019-11-23 LAB — POCT ACTIVATED CLOTTING TIME
Activated Clotting Time: 378 seconds
Activated Clotting Time: 390 seconds
Activated Clotting Time: 208 seconds
Activated Clotting Time: 334 seconds

## 2019-11-23 LAB — BASIC METABOLIC PANEL
Anion gap: 6 (ref 5–15)
BUN: 16 mg/dL (ref 6–20)
CO2: 20 mmol/L — ABNORMAL LOW (ref 22–32)
Calcium: 8.5 mg/dL — ABNORMAL LOW (ref 8.9–10.3)
Chloride: 116 mmol/L — ABNORMAL HIGH (ref 98–111)
Creatinine, Ser: 1.43 mg/dL — ABNORMAL HIGH (ref 0.44–1.00)
GFR calc Af Amer: 46 mL/min — ABNORMAL LOW (ref >60)
GFR calc non Af Amer: 40 mL/min — ABNORMAL LOW (ref >60)
Glucose, Bld: 123 mg/dL — ABNORMAL HIGH (ref 70–99)
Potassium: 3.8 mmol/L (ref 3.5–5.1)
Sodium: 142 mmol/L (ref 135–145)

## 2019-11-23 LAB — CBC
HCT: 31.2 % — ABNORMAL LOW (ref 36.0–46.0)
Hemoglobin: 10 g/dL — ABNORMAL LOW (ref 12.0–15.0)
MCH: 28.7 pg (ref 26.0–34.0)
MCHC: 32.1 g/dL (ref 30.0–36.0)
MCV: 89.4 fL (ref 80.0–100.0)
Platelets: 204 10*3/uL (ref 150–400)
RBC: 3.49 MIL/uL — ABNORMAL LOW (ref 3.87–5.11)
RDW: 12.9 % (ref 11.5–15.5)
WBC: 6.1 10*3/uL (ref 4.0–10.5)
nRBC: 0 % (ref 0.0–0.2)

## 2019-11-23 SURGERY — CORONARY STENT INTERVENTION
Anesthesia: LOCAL

## 2019-11-23 MED ORDER — VERAPAMIL HCL 2.5 MG/ML IV SOLN
INTRAVENOUS | Status: DC | PRN
  Administered 2019-11-23: 10 mL via INTRA_ARTERIAL

## 2019-11-23 MED ORDER — ACETAMINOPHEN 325 MG PO TABS
ORAL_TABLET | ORAL | Status: AC
  Filled 2019-11-23: qty 2

## 2019-11-23 MED ORDER — SODIUM CHLORIDE 0.9 % IV SOLN: INTRAVENOUS | Status: AC

## 2019-11-23 MED ORDER — FENTANYL CITRATE (PF) 100 MCG/2ML IJ SOLN
INTRAMUSCULAR | Status: DC | PRN
  Administered 2019-11-23: 25 ug via INTRAVENOUS
  Administered 2019-11-23: 50 ug via INTRAVENOUS

## 2019-11-23 MED ORDER — DIAZEPAM 5 MG PO TABS: 5.0000 mg | ORAL_TABLET | Freq: Four times a day (QID) | ORAL | Status: DC | PRN

## 2019-11-23 MED ORDER — HEPARIN SODIUM (PORCINE) 1000 UNIT/ML IJ SOLN
INTRAMUSCULAR | Status: DC | PRN
  Administered 2019-11-23: 7500 [IU] via INTRAVENOUS
  Administered 2019-11-23: 2500 [IU] via INTRAVENOUS

## 2019-11-23 MED ORDER — ONDANSETRON HCL 4 MG/2ML IJ SOLN: 4.0000 mg | Freq: Four times a day (QID) | INTRAMUSCULAR | Status: DC | PRN

## 2019-11-23 MED ORDER — IOHEXOL 350 MG/ML SOLN
INTRAVENOUS | Status: AC
  Filled 2019-11-23: qty 1

## 2019-11-23 MED ORDER — CLOPIDOGREL BISULFATE 300 MG PO TABS
ORAL_TABLET | ORAL | Status: DC | PRN
  Administered 2019-11-23: 16:00:00 150 mg via ORAL

## 2019-11-23 MED ORDER — NITROGLYCERIN 1 MG/10 ML FOR IR/CATH LAB
INTRA_ARTERIAL | Status: AC
  Filled 2019-11-23: qty 10

## 2019-11-23 MED ORDER — IOHEXOL 350 MG/ML SOLN
INTRAVENOUS | Status: DC | PRN
  Administered 2019-11-23: 18:00:00 210 mL via INTRA_ARTERIAL

## 2019-11-23 MED ORDER — VERAPAMIL HCL 2.5 MG/ML IV SOLN
INTRAVENOUS | Status: AC
  Filled 2019-11-23: qty 2

## 2019-11-23 MED ORDER — HEPARIN SODIUM (PORCINE) 1000 UNIT/ML IJ SOLN
INTRAMUSCULAR | Status: AC
  Filled 2019-11-23: qty 1

## 2019-11-23 MED ORDER — NITROGLYCERIN 1 MG/10 ML FOR IR/CATH LAB
INTRA_ARTERIAL | Status: DC | PRN
  Administered 2019-11-23 (×2): 200 ug via INTRACORONARY
  Administered 2019-11-23: 100 ug via INTRACORONARY
  Administered 2019-11-23: 200 ug via INTRACORONARY
  Administered 2019-11-23: 100 ug via INTRACORONARY
  Administered 2019-11-23: 200 ug via INTRACORONARY

## 2019-11-23 MED ORDER — LIDOCAINE HCL (PF) 1 % IJ SOLN
INTRAMUSCULAR | Status: AC
  Filled 2019-11-23: qty 30

## 2019-11-23 MED ORDER — MIDAZOLAM HCL 2 MG/2ML IJ SOLN
INTRAMUSCULAR | Status: AC
  Filled 2019-11-23: qty 2

## 2019-11-23 MED ORDER — METOPROLOL TARTRATE 25 MG PO TABS
25.0000 mg | ORAL_TABLET | Freq: Every day | ORAL | Status: DC
  Administered 2019-11-23 – 2019-11-24 (×2): 25 mg via ORAL
  Filled 2019-11-23 (×2): qty 1

## 2019-11-23 MED ORDER — SODIUM CHLORIDE 0.9 % IV SOLN: INTRAVENOUS | Status: DC

## 2019-11-23 MED ORDER — SODIUM CHLORIDE 0.9% FLUSH: 3.0000 mL | INTRAVENOUS | Status: DC | PRN

## 2019-11-23 MED ORDER — SODIUM CHLORIDE 0.9 % IV SOLN
INTRAVENOUS | Status: AC | PRN
  Administered 2019-11-23: 125 mL/h via INTRAVENOUS

## 2019-11-23 MED ORDER — LABETALOL HCL 5 MG/ML IV SOLN: 10.0000 mg | INTRAVENOUS | Status: AC | PRN

## 2019-11-23 MED ORDER — HYDRALAZINE HCL 20 MG/ML IJ SOLN: 10.0000 mg | INTRAMUSCULAR | Status: AC | PRN

## 2019-11-23 MED ORDER — CLOPIDOGREL BISULFATE 75 MG PO TABS
75.0000 mg | ORAL_TABLET | Freq: Every day | ORAL | Status: DC
  Administered 2019-11-24: 08:00:00 75 mg via ORAL
  Filled 2019-11-23: qty 1

## 2019-11-23 MED ORDER — HEPARIN (PORCINE) IN NACL 1000-0.9 UT/500ML-% IV SOLN
INTRAVENOUS | Status: AC
  Filled 2019-11-23: qty 1000

## 2019-11-23 MED ORDER — ASPIRIN 81 MG PO CHEW
81.0000 mg | CHEWABLE_TABLET | Freq: Every day | ORAL | Status: DC
  Administered 2019-11-24: 81 mg via ORAL
  Filled 2019-11-23: qty 1

## 2019-11-23 MED ORDER — ACETAMINOPHEN 325 MG PO TABS
650.0000 mg | ORAL_TABLET | ORAL | Status: DC | PRN
  Administered 2019-11-23: 650 mg via ORAL

## 2019-11-23 MED ORDER — SODIUM CHLORIDE 0.9% FLUSH: 3.0000 mL | Freq: Two times a day (BID) | INTRAVENOUS | Status: DC

## 2019-11-23 MED ORDER — LIDOCAINE HCL (PF) 1 % IJ SOLN
INTRAMUSCULAR | Status: DC | PRN
  Administered 2019-11-23: 2 mL via INTRADERMAL

## 2019-11-23 MED ORDER — SODIUM CHLORIDE 0.9 % IV SOLN: 250.0000 mL | INTRAVENOUS | Status: DC | PRN

## 2019-11-23 MED ORDER — CLOPIDOGREL BISULFATE 75 MG PO TABS
ORAL_TABLET | ORAL | Status: AC
  Filled 2019-11-23: qty 2

## 2019-11-23 MED ORDER — MIDAZOLAM HCL 2 MG/2ML IJ SOLN
INTRAMUSCULAR | Status: DC | PRN
  Administered 2019-11-23: 2 mg via INTRAVENOUS
  Administered 2019-11-23 (×2): 1 mg via INTRAVENOUS

## 2019-11-23 MED ORDER — FENTANYL CITRATE (PF) 100 MCG/2ML IJ SOLN
INTRAMUSCULAR | Status: AC
  Filled 2019-11-23: qty 2

## 2019-11-23 SURGICAL SUPPLY — 19 items
BALLN SAPPHIRE 2.0X15 (BALLOONS) ×2
BALLN SAPPHIRE ~~LOC~~ 2.25X18 (BALLOONS) ×1 IMPLANT
BALLN ~~LOC~~ EMERGE MR 2.5X20 (BALLOONS) ×2
BALLOON SAPPHIRE 2.0X15 (BALLOONS) IMPLANT
BALLOON ~~LOC~~ EMERGE MR 2.5X20 (BALLOONS) IMPLANT
CATH LAUNCHER 6FR JR4 (CATHETERS) ×1 IMPLANT
DEVICE RAD COMP TR BAND LRG (VASCULAR PRODUCTS) ×1 IMPLANT
GLIDESHEATH SLEND SS 6F .021 (SHEATH) ×1 IMPLANT
GUIDEWIRE INQWIRE 1.5J.035X260 (WIRE) IMPLANT
INQWIRE 1.5J .035X260CM (WIRE) ×2
KIT ENCORE 26 ADVANTAGE (KITS) ×2 IMPLANT
KIT HEART LEFT (KITS) ×2 IMPLANT
PACK CARDIAC CATHETERIZATION (CUSTOM PROCEDURE TRAY) ×2 IMPLANT
STENT RESOLUTE ONYX 2.0X30 (Permanent Stent) ×1 IMPLANT
STENT RESOLUTE ONYX 2.25X12 (Permanent Stent) ×1 IMPLANT
STENT RESOLUTE ONYX 2.25X30 (Permanent Stent) ×1 IMPLANT
TRANSDUCER W/STOPCOCK (MISCELLANEOUS) ×2 IMPLANT
TUBING CIL FLEX 10 FLL-RA (TUBING) ×2 IMPLANT
WIRE COUGAR XT STRL 190CM (WIRE) ×1 IMPLANT

## 2019-11-23 NOTE — Progress Notes (Addendum)
Paged Claiborne Billings MD at [phone].Pt 5/10 bilateral back & shoulder/chest pain; PRN morphine given at 0746.relieved 3/10 w/in 1 hour.no SOB, VS stable.   Paged NP Mancel Bale at 1004..5/10 bilateral back & shoulder pain; PRN morphine given at 0746.relieved 3/10 w/in 1 hour.no SOB,VS stable 166/73 & 144/70  Paged Roberts at 1355.10/10 chest & back pain. BP 170/72, HR 80, 99% on RA. SL nitro given at 1352. ekg in progress. Upon callback informed to start nitro gtt and informed NP pain relieved with one SL nitro.  Nitro gtt started.  RN assist with transport to cath lab at 1505.

## 2019-11-23 NOTE — Progress Notes (Signed)
Received to [phone] via stretcher from Cath Lab. Rt wrist cath site with sm hematoma resolved by manual pressure per cath lab tech. Old blood noted around band. No new drainage noted. Ntg gtt at 30 mcg/min and NS at 150 ml/hr per PIV. Oriented to room and unit. Call bell and phone in reach. Son into room at bedside. Encouraged to call for needs/oob etc. Verbalized undertanding.

## 2019-11-23 NOTE — Plan of Care (Signed)
?  Problem: Clinical Measurements: ?Goal: Ability to maintain clinical measurements within normal limits will improve ?Outcome: Progressing ?Goal: Will remain free from infection ?Outcome: Progressing ?Goal: Diagnostic test results will improve ?Outcome: Progressing ?  ?

## 2019-11-23 NOTE — Interval H&P Note (Signed)
Cath Lab Visit (complete for each Cath Lab visit)  Clinical Evaluation Leading to the Procedure:   ACS: No.  Non-ACS:    Anginal Classification: CCS III  Anti-ischemic medical therapy: Maximal Therapy (2 or more classes of medications)  Non-Invasive Test Results: No non-invasive testing performed  Prior CABG: No previous CABG      History and Physical Interval Note:  11/23/2019 3:30 PM  Meghan Terry  has presented today for surgery, with the diagnosis of CAD.  The various methods of treatment have been discussed with the patient and family. After consideration of risks, benefits and other options for treatment, the patient has consented to  Procedure(s): CORONARY STENT INTERVENTION (N/A) as a surgical intervention.  The patient's history has been reviewed, patient examined, no change in status, stable for surgery.  I have reviewed the patient's chart and labs.  Questions were answered to the patient's satisfaction.     Shelva Majestic

## 2019-11-23 NOTE — Progress Notes (Addendum)
Progress Note  Patient Name: Meghan Terry Date of Encounter: 11/23/2019  Primary Cardiologist: No primary care provider on file.   Subjective   Laying in bed resting. Somewhat groggy, but received pain meds this morning.  Notably more awake when I saw her.  Her son was there.  No more pain.  Pain medication this morning with is a back pain  Inpatient Medications    Scheduled Meds: . aspirin  81 mg Oral Daily  . clopidogrel  75 mg Oral Q breakfast  . ranolazine  500 mg Oral BID  . rOPINIRole  4 mg Oral QHS  . rosuvastatin  40 mg Oral q1800  . sodium chloride flush  3 mL Intravenous Q12H  . sodium chloride flush  3 mL Intravenous Q12H  . topiramate  100 mg Oral Daily  . traZODone  150 mg Oral QHS   Continuous Infusions: . sodium chloride Stopped (11/23/19 0548)  . sodium chloride    . sodium chloride    . sodium chloride 100 mL/hr at 11/23/19 0548  . nitroGLYCERIN     PRN Meds: sodium chloride, sodium chloride, acetaminophen, ALPRAZolam, morphine injection, nitroGLYCERIN, ondansetron (ZOFRAN) IV, sodium chloride flush, sodium chloride flush   Vital Signs    Vitals:   11/23/19 0444 11/23/19 0742 11/23/19 0809 11/23/19 1000  BP:  (!) 166/73 135/68 (!) 144/70  Pulse:  76 69 75  Resp:   17   Temp:  97.7 F (36.5 C) 98.3 F (36.8 C)   TempSrc:  Oral Oral   SpO2:  100% 99% 99%  Weight: 64.8 kg     Height:        Intake/Output Summary (Last 24 hours) at 11/23/2019 1040 Last data filed at 11/23/2019 0900 Gross per 24 hour  Intake 5172.32 ml  Output 3400 ml  Net 1772.32 ml   Last 3 Weights 11/23/2019 11/22/2019 11/22/2019  Weight (lbs) 142 lb 14.4 oz 141 lb 148 lb  Weight (kg) 64.819 kg 63.957 kg 67.132 kg  Some encounter information is confidential and restricted. Go to Review Flowsheets activity to see all data.      Telemetry    SR - Personally Reviewed  ECG    NSR - Personally Reviewed  Physical Exam  Older WF, laying in bed GEN: No acute distress.     Neck: No JVD Cardiac: RRR, no murmurs, rubs, or gallops.  Respiratory: Clear to auscultation bilaterally. GI: Soft, nontender, non-distended  MS: No edema; No deformity. Right femoral cath site stable. Neuro:  Nonfocal  Psych: Normal affect   Labs    High Sensitivity Troponin:  No results for input(s): TROPONINIHS in the last 720 hours.    Chemistry Recent Labs  Lab 11/17/19 1412 11/23/19 0448  NA 142 142  K 4.3 3.8  CL 109* 116*  CO2 19* 20*  GLUCOSE 119* 123*  BUN 28* 16  CREATININE 1.46* 1.43*  CALCIUM 9.3 8.5*  GFRNONAA 39* 40*  GFRAA 45* 46*  ANIONGAP  --  6     Hematology Recent Labs  Lab 11/17/19 1412 11/23/19 0448  WBC 7.6 6.1  RBC 4.08 3.49*  HGB 11.8 10.0*  HCT 35.7 31.2*  MCV 88 89.4  MCH 28.9 28.7  MCHC 33.1 32.1  RDW 12.9 12.9  PLT 316 204    BNPNo results for input(s): BNP, PROBNP in the last 168 hours.   DDimer No results for input(s): DDIMER in the last 168 hours.   Radiology    CARDIAC CATHETERIZATION  Result Date: 11/22/2019  RPAV lesion is 90% stenosed.  Prox RCA to Mid RCA lesion is 80% stenosed.  Mid Cx lesion is 99% stenosed.  2nd Mrg-1 lesion is 95% stenosed.  2nd Mrg-2 lesion is 95% stenosed with 0% stenosed side branch in Lat 2nd Mrg.  Ost LAD to Prox LAD lesion is 70% stenosed.  Prox LAD to Mid LAD lesion is 50% stenosed with 50% stenosed side branch in 1st Diag.  Origin to Prox Graft lesion is 80% stenosed.  Prox Graft to Mid Graft lesion is 70% stenosed.  LV end diastolic pressure is low.  The left ventricular ejection fraction is 55-65% by visual estimate.  Dist RCA lesion is 80% stenosed with 70% stenosed side branch in RPAV.  Severe multivessel native CAD with smooth ostial tapering of 20% at the left main, 15 and 50% proximal LAD stenosis in a calcified segment with competitive filling of the mid LAD via the LIMA graft; subtotal/total occlusion of the circumflex vessel after a small OM1 summary: Large dominant RCA  with diffuse 80% mid stenosis, 80% stenosis proximal to the PDA takeoff and 90% stenosis in the continuation branch proximal to 2 PLA vessels. Patent LIMA graft supplying the mid LAD. Patent SVG supplying the distal marginal vessel however there is diffuse 95% stenosis just beyond the grafts anastomosis and a very small caliber distal circumflex vessel. Diffuse disease in the SVG supplying the mid PDA with a long area of proximal 80% stenosis followed by an area of 70% stenosis between 2 valves in the vein extending to the mid graft. Preserved global LV function with small focal area of distal inferior hypocontractility; EF estimate 55 to 60%.  LVEDP 9 to 11 mm. RECOMMENDATION: We will review with colleagues.  Since the patient has a severely diseased SVG to PDA only 2 months after insertion and has significant potential ischemia due to the 90% stenosis in the continuation branch of the PDA supplying the posterolateral territory review angiograms with colleagues and consider PCI to the native RCA.  The distal circumflex territory is severely diffusely diseased and a very small caliber which may not be amenable to intervention.  Aggressive lipid-lowering therapy with target LDL less than 70.  Plan medical therapy for concomitant CAD and will initiate ranolazine 500 mg twice a day.  Will discuss with colleagues and family members and tentatively plan intervention tomorrow.    Cardiac Studies   Cath: 11/22/19   RPAV lesion is 90% stenosed.  Prox RCA to Mid RCA lesion is 80% stenosed.  Mid Cx lesion is 99% stenosed.  2nd Mrg-1 lesion is 95% stenosed.  2nd Mrg-2 lesion is 95% stenosed with 0% stenosed side branch in Lat 2nd Mrg.  Ost LAD to Prox LAD lesion is 70% stenosed.  Prox LAD to Mid LAD lesion is 50% stenosed with 50% stenosed side branch in 1st Diag.  Origin to Prox Graft lesion is 80% stenosed.  Prox Graft to Mid Graft lesion is 70% stenosed.  LV end diastolic pressure is low.  The  left ventricular ejection fraction is 55-65% by visual estimate.  Dist RCA lesion is 80% stenosed with 70% stenosed side branch in RPAV.   Severe multivessel native CAD with smooth ostial tapering of 20% at the left main, 74 and 50% proximal LAD stenosis in a calcified segment with competitive filling of the mid LAD via the LIMA graft; subtotal/total occlusion of the circumflex vessel after a small OM1 summary: Large dominant RCA with diffuse 80% mid stenosis,  80% stenosis proximal to the PDA takeoff and 90% stenosis in the continuation branch proximal to 2 PLA vessels.  Patent LIMA graft supplying the mid LAD.  Patent SVG supplying the distal marginal vessel however there is diffuse 95% stenosis just beyond the grafts anastomosis and a very small caliber distal circumflex vessel.  Diffuse disease in the SVG supplying the mid PDA with a long area of proximal 80% stenosis followed by an area of 70% stenosis between 2 valves in the vein extending to the mid graft.  Preserved global LV function with small focal area of distal inferior hypocontractility; EF estimate 55 to 60%.  LVEDP 9 to 11 mm.  RECOMMENDATION: We will review with colleagues.  Since the patient has a severely diseased SVG to PDA only 2 months after insertion and has significant potential ischemia due to the 90% stenosis in the continuation branch of the PDA supplying the posterolateral territory review angiograms with colleagues and consider PCI to the native RCA.  The distal circumflex territory is severely diffusely diseased and a very small caliber which may not be amenable to intervention.  Aggressive lipid-lowering therapy with target LDL less than 70.  Plan medical therapy for concomitant CAD and will initiate ranolazine 500 mg twice a day.  Will discuss with colleagues and family members and tentatively plan intervention tomorrow.  Diagnostic Dominance: Right     Patient Profile     59 y.o. female with PMH of CAD  s/p CABG, HTN, HL, hypothyroidism, RLS, back pain and anxiety who presented for outpatient cath. Found to have disease in SVG-PDA and planned for intervention to native RCA.  Assessment & Plan    1. CAD s/p CABG with Progressive Angina: presented yesterday for outpatient cardiac cath noted above with patent LIMA-LAD, and SVG-distal OM. Does have 95% stenosis just beyond graft site in a very small caliber vessel. Also with diffusely diseased SVG- PDA. Images were reviewed with interventional colleagues and planned today for intervention to the native RCA. No chest pain overnight but did have back and shoulder pain which was relieved by morphine.  -- on asa, plavix, statin, and started on ranexa post cath. Will reorder home metoprolol.  2. HL: on statin  3. HTN: stable with a few elevated readings. Add back home BB therapy as above.  For questions or updates, please contact [facility] Please consult [email or web address] for contact info under    Signed, Reino Bellis, NP  11/23/2019, 10:40 AM    ATTENDING ATTESTATION  I have seen, examined and evaluated the patient this AM along with Reino Bellis, NP-C.  After reviewing all the available data and chart, we discussed the patients laboratory, study & physical findings as well as symptoms in detail. I agree with her findings, examination as well as impression recommendations as per our discussion.    Attending adjustments noted in italics.   I personally reviewed the cath images with Dr. Claiborne Billings yesterday.  After discussion with Dr. Claiborne Billings and Dr. Martinique as well as Dr. Fletcher Anon, the consensus was that the best option would be to perform PCI on the native RCA which allows reperfusion of the PL system.  The vein graft to the PDA has significant disease, and is not allowing retrograde flow because of antegrade flow in the native RCA.  There is no retrograde flow option to fill the PL branch because of the stenosis prior to the PL from PAVb.  I spent some  time talking with the patient and her son explaining the  concept.  They fully understand and appreciate the effort.  The plan will be to use radial approach to avoid prolonged lying flat post cath.  Providing goes well and smoothly, anticipate discharge tomorrow.  Will be on dual antiplatelet therapy. Was started on Imdur post-cath, will restart beta-blocker.  Glenetta Hew, MD   Glenetta Hew, M.D., M.S. Interventional Cardiologist   Pager # [phone] Phone # [phone] [address] [place], [place] [address]

## 2019-11-23 NOTE — H&P (View-Only) (Signed)
Progress Note  Patient Name: [name] Date of Encounter: 11/23/2019  Primary Cardiologist: No primary care provider on file.   Subjective   Laying in bed resting. Somewhat groggy, but received pain meds this morning.  Notably more awake when I saw her.  Her son was there.  No more pain.  Pain medication this morning with is a back pain  Inpatient Medications    Scheduled Meds: . aspirin  81 mg Oral Daily  . clopidogrel  75 mg Oral Q breakfast  . ranolazine  500 mg Oral BID  . rOPINIRole  4 mg Oral QHS  . rosuvastatin  40 mg Oral q1800  . sodium chloride flush  3 mL Intravenous Q12H  . sodium chloride flush  3 mL Intravenous Q12H  . topiramate  100 mg Oral Daily  . traZODone  150 mg Oral QHS   Continuous Infusions: . sodium chloride Stopped (11/23/19 0548)  . sodium chloride    . sodium chloride    . sodium chloride 100 mL/hr at 11/23/19 0548  . nitroGLYCERIN     PRN Meds: sodium chloride, sodium chloride, acetaminophen, ALPRAZolam, morphine injection, nitroGLYCERIN, ondansetron (ZOFRAN) IV, sodium chloride flush, sodium chloride flush   Vital Signs    Vitals:   11/23/19 0444 11/23/19 0742 11/23/19 0809 11/23/19 1000  BP:  (!) 166/73 135/68 (!) 144/70  Pulse:  76 69 75  Resp:   17   Temp:  97.7 F (36.5 C) 98.3 F (36.8 C)   TempSrc:  Oral Oral   SpO2:  100% 99% 99%  Weight: 64.8 kg     Height:        Intake/Output Summary (Last 24 hours) at 11/23/2019 1040 Last data filed at 11/23/2019 0900 Gross per 24 hour  Intake 5172.32 ml  Output 3400 ml  Net 1772.32 ml   Last 3 Weights 11/23/2019 11/22/2019 11/22/2019  Weight (lbs) 142 lb 14.4 oz 141 lb 148 lb  Weight (kg) 64.819 kg 63.957 kg 67.132 kg  Some encounter information is confidential and restricted. Go to Review Flowsheets activity to see all data.      Telemetry    SR - Personally Reviewed  ECG    NSR - Personally Reviewed  Physical Exam  Older WF, laying in bed GEN: No acute distress.     Neck: No JVD Cardiac: RRR, no murmurs, rubs, or gallops.  Respiratory: Clear to auscultation bilaterally. GI: Soft, nontender, non-distended  MS: No edema; No deformity. Right femoral cath site stable. Neuro:  Nonfocal  Psych: Normal affect   Labs    High Sensitivity Troponin:  No results for input(s): TROPONINIHS in the last 720 hours.    Chemistry Recent Labs  Lab 11/17/19 1412 11/23/19 0448  NA 142 142  K 4.3 3.8  CL 109* 116*  CO2 19* 20*  GLUCOSE 119* 123*  BUN 28* 16  CREATININE 1.46* 1.43*  CALCIUM 9.3 8.5*  GFRNONAA 39* 40*  GFRAA 45* 46*  ANIONGAP  --  6     Hematology Recent Labs  Lab 11/17/19 1412 11/23/19 0448  WBC 7.6 6.1  RBC 4.08 3.49*  HGB 11.8 10.0*  HCT 35.7 31.2*  MCV 88 89.4  MCH 28.9 28.7  MCHC 33.1 32.1  RDW 12.9 12.9  PLT 316 204    BNPNo results for input(s): BNP, PROBNP in the last 168 hours.   DDimer No results for input(s): DDIMER in the last 168 hours.   Radiology    CARDIAC CATHETERIZATION  Result Date: 11/22/2019  RPAV lesion is 90% stenosed.  Prox RCA to Mid RCA lesion is 80% stenosed.  Mid Cx lesion is 99% stenosed.  2nd Mrg-1 lesion is 95% stenosed.  2nd Mrg-2 lesion is 95% stenosed with 0% stenosed side branch in Lat 2nd Mrg.  Ost LAD to Prox LAD lesion is 70% stenosed.  Prox LAD to Mid LAD lesion is 50% stenosed with 50% stenosed side branch in 1st Diag.  Origin to Prox Graft lesion is 80% stenosed.  Prox Graft to Mid Graft lesion is 70% stenosed.  LV end diastolic pressure is low.  The left ventricular ejection fraction is 55-65% by visual estimate.  Dist RCA lesion is 80% stenosed with 70% stenosed side branch in RPAV.  Severe multivessel native CAD with smooth ostial tapering of 20% at the left main, 54 and 50% proximal LAD stenosis in a calcified segment with competitive filling of the mid LAD via the LIMA graft; subtotal/total occlusion of the circumflex vessel after a small OM1 summary: Large dominant RCA  with diffuse 80% mid stenosis, 80% stenosis proximal to the PDA takeoff and 90% stenosis in the continuation branch proximal to 2 PLA vessels. Patent LIMA graft supplying the mid LAD. Patent SVG supplying the distal marginal vessel however there is diffuse 95% stenosis just beyond the grafts anastomosis and a very small caliber distal circumflex vessel. Diffuse disease in the SVG supplying the mid PDA with a long area of proximal 80% stenosis followed by an area of 70% stenosis between 2 valves in the vein extending to the mid graft. Preserved global LV function with small focal area of distal inferior hypocontractility; EF estimate 55 to 60%.  LVEDP 9 to 11 mm. RECOMMENDATION: We will review with colleagues.  Since the patient has a severely diseased SVG to PDA only 2 months after insertion and has significant potential ischemia due to the 90% stenosis in the continuation branch of the PDA supplying the posterolateral territory review angiograms with colleagues and consider PCI to the native RCA.  The distal circumflex territory is severely diffusely diseased and a very small caliber which may not be amenable to intervention.  Aggressive lipid-lowering therapy with target LDL less than 70.  Plan medical therapy for concomitant CAD and will initiate ranolazine 500 mg twice a day.  Will discuss with colleagues and family members and tentatively plan intervention tomorrow.    Cardiac Studies   Cath: 11/22/19   RPAV lesion is 90% stenosed.  Prox RCA to Mid RCA lesion is 80% stenosed.  Mid Cx lesion is 99% stenosed.  2nd Mrg-1 lesion is 95% stenosed.  2nd Mrg-2 lesion is 95% stenosed with 0% stenosed side branch in Lat 2nd Mrg.  Ost LAD to Prox LAD lesion is 70% stenosed.  Prox LAD to Mid LAD lesion is 50% stenosed with 50% stenosed side branch in 1st Diag.  Origin to Prox Graft lesion is 80% stenosed.  Prox Graft to Mid Graft lesion is 70% stenosed.  LV end diastolic pressure is low.  The  left ventricular ejection fraction is 55-65% by visual estimate.  Dist RCA lesion is 80% stenosed with 70% stenosed side branch in RPAV.   Severe multivessel native CAD with smooth ostial tapering of 20% at the left main, 43 and 50% proximal LAD stenosis in a calcified segment with competitive filling of the mid LAD via the LIMA graft; subtotal/total occlusion of the circumflex vessel after a small OM1 summary: Large dominant RCA with diffuse 80% mid stenosis,  80% stenosis proximal to the PDA takeoff and 90% stenosis in the continuation branch proximal to 2 PLA vessels.  Patent LIMA graft supplying the mid LAD.  Patent SVG supplying the distal marginal vessel however there is diffuse 95% stenosis just beyond the grafts anastomosis and a very small caliber distal circumflex vessel.  Diffuse disease in the SVG supplying the mid PDA with a long area of proximal 80% stenosis followed by an area of 70% stenosis between 2 valves in the vein extending to the mid graft.  Preserved global LV function with small focal area of distal inferior hypocontractility; EF estimate 55 to 60%.  LVEDP 9 to 11 mm.  RECOMMENDATION: We will review with colleagues.  Since the patient has a severely diseased SVG to PDA only 2 months after insertion and has significant potential ischemia due to the 90% stenosis in the continuation branch of the PDA supplying the posterolateral territory review angiograms with colleagues and consider PCI to the native RCA.  The distal circumflex territory is severely diffusely diseased and a very small caliber which may not be amenable to intervention.  Aggressive lipid-lowering therapy with target LDL less than 70.  Plan medical therapy for concomitant CAD and will initiate ranolazine 500 mg twice a day.  Will discuss with colleagues and family members and tentatively plan intervention tomorrow.  Diagnostic Dominance: Right     Patient Profile     59 y.o. female with PMH of CAD  s/p CABG, HTN, HL, hypothyroidism, RLS, back pain and anxiety who presented for outpatient cath. Found to have disease in SVG-PDA and planned for intervention to native RCA.  Assessment & Plan    1. CAD s/p CABG with Progressive Angina: presented yesterday for outpatient cardiac cath noted above with patent LIMA-LAD, and SVG-distal OM. Does have 95% stenosis just beyond graft site in a very small caliber vessel. Also with diffusely diseased SVG- PDA. Images were reviewed with interventional colleagues and planned today for intervention to the native RCA. No chest pain overnight but did have back and shoulder pain which was relieved by morphine.  -- on asa, plavix, statin, and started on ranexa post cath. Will reorder home metoprolol.  2. HL: on statin  3. HTN: stable with a few elevated readings. Add back home BB therapy as above.  For questions or updates, please contact [facility] Please consult [email or web address] for contact info under    Signed, Reino Bellis, NP  11/23/2019, 10:40 AM    ATTENDING ATTESTATION  I have seen, examined and evaluated the patient this AM along with Reino Bellis, NP-C.  After reviewing all the available data and chart, we discussed the patients laboratory, study & physical findings as well as symptoms in detail. I agree with her findings, examination as well as impression recommendations as per our discussion.    Attending adjustments noted in italics.   I personally reviewed the cath images with Dr. Claiborne Billings yesterday.  After discussion with Dr. Claiborne Billings and Dr. Martinique as well as Dr. Fletcher Anon, the consensus was that the best option would be to perform PCI on the native RCA which allows reperfusion of the PL system.  The vein graft to the PDA has significant disease, and is not allowing retrograde flow because of antegrade flow in the native RCA.  There is no retrograde flow option to fill the PL branch because of the stenosis prior to the PL from PAVb.  I spent some  time talking with the patient and her son explaining the  concept.  They fully understand and appreciate the effort.  The plan will be to use radial approach to avoid prolonged lying flat post cath.  Providing goes well and smoothly, anticipate discharge tomorrow.  Will be on dual antiplatelet therapy. Was started on Imdur post-cath, will restart beta-blocker.  Glenetta Hew, MD   Glenetta Hew, M.D., M.S. Interventional Cardiologist   Pager # [phone] Phone # [phone] [address] [place], [place] [address]

## 2019-11-24 DIAGNOSIS — E785 Hyperlipidemia, unspecified: Secondary | ICD-10-CM

## 2019-11-24 DIAGNOSIS — Z951 Presence of aortocoronary bypass graft: Secondary | ICD-10-CM

## 2019-11-24 DIAGNOSIS — Z955 Presence of coronary angioplasty implant and graft: Secondary | ICD-10-CM

## 2019-11-24 DIAGNOSIS — I209 Angina pectoris, unspecified: Secondary | ICD-10-CM

## 2019-11-24 HISTORY — DX: Presence of coronary angioplasty implant and graft: Z95.5

## 2019-11-24 HISTORY — DX: Hyperlipidemia, unspecified: E78.5

## 2019-11-24 LAB — BASIC METABOLIC PANEL
Anion gap: 8 (ref 5–15)
BUN: 15 mg/dL (ref 6–20)
CO2: 17 mmol/L — ABNORMAL LOW (ref 22–32)
Calcium: 8.3 mg/dL — ABNORMAL LOW (ref 8.9–10.3)
Chloride: 113 mmol/L — ABNORMAL HIGH (ref 98–111)
Creatinine, Ser: 1.47 mg/dL — ABNORMAL HIGH (ref 0.44–1.00)
GFR calc Af Amer: 45 mL/min — ABNORMAL LOW (ref >60)
GFR calc non Af Amer: 38 mL/min — ABNORMAL LOW (ref >60)
Glucose, Bld: 131 mg/dL — ABNORMAL HIGH (ref 70–99)
Potassium: 3.8 mmol/L (ref 3.5–5.1)
Sodium: 138 mmol/L (ref 135–145)

## 2019-11-24 LAB — CBC
HCT: 30.1 % — ABNORMAL LOW (ref 36.0–46.0)
Hemoglobin: 9.6 g/dL — ABNORMAL LOW (ref 12.0–15.0)
MCH: 28.8 pg (ref 26.0–34.0)
MCHC: 31.9 g/dL (ref 30.0–36.0)
MCV: 90.4 fL (ref 80.0–100.0)
Platelets: 212 10*3/uL (ref 150–400)
RBC: 3.33 MIL/uL — ABNORMAL LOW (ref 3.87–5.11)
RDW: 12.8 % (ref 11.5–15.5)
WBC: 5.8 10*3/uL (ref 4.0–10.5)
nRBC: 0 % (ref 0.0–0.2)

## 2019-11-24 MED ORDER — AMLODIPINE BESYLATE 2.5 MG PO TABS: 2.5000 mg | ORAL_TABLET | Freq: Every day | ORAL | Status: DC

## 2019-11-24 MED ORDER — RANOLAZINE ER 500 MG PO TB12: 500.0000 mg | ORAL_TABLET | Freq: Two times a day (BID) | ORAL | 1 refills | Status: DC

## 2019-11-24 MED ORDER — METOPROLOL TARTRATE 37.5 MG PO TABS: 37.5000 mg | ORAL_TABLET | Freq: Two times a day (BID) | ORAL | 1 refills | Status: DC

## 2019-11-24 MED ORDER — METOPROLOL TARTRATE 25 MG PO TABS: 37.5000 mg | ORAL_TABLET | Freq: Two times a day (BID) | ORAL | Status: DC

## 2019-11-24 MED ORDER — FERROUS SULFATE 325 (65 FE) MG PO TABS: 325.0000 mg | ORAL_TABLET | Freq: Every day | ORAL | Status: DC

## 2019-11-24 MED ORDER — FERROUS SULFATE 325 (65 FE) MG PO TABS: 325.0000 mg | ORAL_TABLET | Freq: Every day | ORAL | 1 refills | Status: DC

## 2019-11-24 MED ORDER — AMLODIPINE BESYLATE 2.5 MG PO TABS: 2.5000 mg | ORAL_TABLET | Freq: Every day | ORAL | 2 refills | Status: DC

## 2019-11-24 MED FILL — Heparin Sod (Porcine)-NaCl IV Soln 1000 Unit/500ML-0.9%: INTRAVENOUS | Qty: 1000 | Status: AC

## 2019-11-24 MED FILL — Nitroglycerin IV Soln 100 MCG/ML in D5W: INTRA_ARTERIAL | Qty: 10 | Status: AC

## 2019-11-24 MED FILL — Clopidogrel Bisulfate Tab 75 MG (Base Equiv): ORAL | Qty: 2 | Status: AC

## 2019-11-24 NOTE — Progress Notes (Signed)
CARDIAC REHAB PHASE I   PRE:  Rate/Rhythm: 82 SR    BP: sitting 138/53    SaO2:   MODE:  Ambulation: 470 ft   POST:  Rate/Rhythm: 98 SR    BP: sitting 190/85    SaO2:    Pt tolerated first 350 ft well but began having back tightness that moved to chest tightness at end of walk. Resolved with 5 min rest. BP elevated. Sts this 4/10 CP was "nothing" for her. At home she would probably take a NTG at 8/10. Discussed stent, Plavix, restrictions, NTG, diet, exercise, remaining nicotine free, and CRPII. Good reception. Unfortunately she was unable to do CRPII after for CABG as her insurance charged $150 a visit. I placed referral for [place] again. [id number]  [place], ACSM 11/24/2019 10:42 AM

## 2019-11-24 NOTE — Progress Notes (Signed)
Pt c/o chest pain in mid chest radiating to back. Squeezing pain.  Nitro SL x2 given with good effect.  EKG done.  Cardiology made aware.  Idolina Primer, RN

## 2019-11-24 NOTE — Progress Notes (Signed)
NURSING PROGRESS NOTE  JAMES BURROW [id number] Discharge Data: 11/24/2019 12:30 PM Attending Provider: [name], MD [id number], Marlene Lard, MD     [name] to be D/C'd Home per MD order.  Discussed with the patient the After Visit Summary and all questions fully answered. All IV's discontinued with no bleeding noted. All belongings returned to patient for patient to take home. Pt taken downstairs via wheelchair accompanied by staff member.  Last Vital Signs:  Blood pressure 115/71, pulse 95, temperature 98.2 F (36.8 C), temperature source Oral, resp. rate 15, height 5\' 3"  (1.6 m), weight 65.1 kg, SpO2 97 %.  Discharge Medication List Allergies as of 11/24/2019      Reactions   Carbidopa-levodopa Other (See Comments)   Developed tics while taking   Prednisone Other (See Comments)   Turns red all over   Venlafaxine Other (See Comments)   Developed tics while taking      Medication List    STOP taking these medications   losartan 25 MG tablet Commonly known as: COZAAR     TAKE these medications   acetaminophen 500 MG tablet Commonly known as: TYLENOL Take 500-1,000 mg by mouth every 6 (six) hours as needed (for pain.).   ALPRAZolam 0.25 MG tablet Commonly known as: XANAX Take 1 tablet (0.25 mg total) by mouth at bedtime as needed for anxiety.   amLODipine 2.5 MG tablet Commonly known as: NORVASC Take 1 tablet (2.5 mg total) by mouth daily.   aspirin EC 81 MG tablet Take 1 tablet (81 mg total) by mouth daily.   buPROPion 300 MG 24 hr tablet Commonly known as: WELLBUTRIN XL Take 1 tablet (300 mg total) by mouth every morning.   clopidogrel 75 MG tablet Commonly known as: PLAVIX TAKE 1 TABLET BY MOUTH DAILY   ferrous sulfate 325 (65 FE) MG tablet Take 1 tablet (325 mg total) by mouth daily with breakfast. Start taking on: November 25, 2019   isosorbide mononitrate 60 MG 24 hr tablet Commonly known as: IMDUR Take 1 tablet (60 mg total) by mouth daily.    levothyroxine 75 MCG tablet Commonly known as: SYNTHROID Take 75 mcg by mouth daily before breakfast.   Metoprolol Tartrate 37.5 MG Tabs Take 37.5 mg by mouth 2 (two) times daily. What changed:   medication strength  how much to take  when to take this   ondansetron 4 MG tablet Commonly known as: Zofran Take 1 tablet (4 mg total) by mouth every 8 (eight) hours as needed for nausea or vomiting. What changed: additional instructions   ranolazine 500 MG 12 hr tablet Commonly known as: RANEXA Take 1 tablet (500 mg total) by mouth 2 (two) times daily.   rOPINIRole 1 MG tablet Commonly known as: REQUIP Take 1 mg by mouth 3 (three) times daily.   rOPINIRole 4 MG tablet Commonly known as: REQUIP Take 4 mg by mouth at bedtime.   rosuvastatin 20 MG tablet Commonly known as: CRESTOR Take 1 tablet (20 mg total) by mouth daily at 6 PM.   topiramate 100 MG tablet Commonly known as: TOPAMAX Take 100 mg by mouth 2 (two) times daily.   traMADol 50 MG tablet Commonly known as: ULTRAM Take 1 tablet (50 mg total) by mouth every 12 (twelve) hours as needed (pain).   traZODone 150 MG tablet Commonly known as: DESYREL Take 150 mg by mouth at bedtime.

## 2019-11-24 NOTE — Discharge Summary (Addendum)
Discharge Summary    Patient ID: Meghan Terry,  MRN: [id number], DOB/AGE: 11/27/1960 59 y.o.  Admit date: 11/22/2019 Discharge date: 12/03/2019  Primary Care Provider: Garwin Brothers Primary Cardiologist: Jenean Lindau, MD  Discharge Diagnoses    Principal Problem:   Angina pectoris [facility]) Active Problems:   Coronary artery disease involving native coronary artery of native heart with angina pectoris ([place])   Presence of drug coated stent in right coronary artery   Hyperlipidemia with target LDL less than 70   CKD (chronic kidney disease) stage 3b, GFR 30-59 ml/min   Essential hypertension   S/P CABG x 3   Allergies Allergies  Allergen Reactions  . Carbidopa-Levodopa Other (See Comments)    Developed tics while taking   . Prednisone Other (See Comments)    Turns red all over   . Venlafaxine Other (See Comments)    Developed tics while taking     Diagnostic Studies/Procedures    Cath: 11/22/19   RPAV lesion is 90% stenosed.  Prox RCA to Mid RCA lesion is 80% stenosed.  Mid Cx lesion is 99% stenosed.  2nd Mrg-1 lesion is 95% stenosed.  2nd Mrg-2 lesion is 95% stenosed with 0% stenosed side branch in Lat 2nd Mrg.  Ost LAD to Prox LAD lesion is 70% stenosed.  Prox LAD to Mid LAD lesion is 50% stenosed with 50% stenosed side branch in 1st Diag.  Origin to Prox Graft lesion is 80% stenosed.  Prox Graft to Mid Graft lesion is 70% stenosed.  LV end diastolic pressure is low.  The left ventricular ejection fraction is 55-65% by visual estimate.  Dist RCA lesion is 80% stenosed with 70% stenosed side branch in RPAV.   Severe multivessel native CAD with smooth ostial tapering of 20% at the left main, 56 and 50% proximal LAD stenosis in a calcified segment with competitive filling of the mid LAD via the LIMA graft; subtotal/total occlusion of the circumflex vessel after a small OM1 summary: Large dominant RCA with diffuse 80% mid stenosis, 80% stenosis proximal to  the PDA takeoff and 90% stenosis in the continuation branch proximal to 2 PLA vessels.  Patent LIMA graft supplying the mid LAD.  Patent SVG supplying the distal marginal vessel however there is diffuse 95% stenosis just beyond the grafts anastomosis and a very small caliber distal circumflex vessel.  Diffuse disease in the SVG supplying the mid PDA with a long area of proximal 80% stenosis followed by an area of 70% stenosis between 2 valves in the vein extending to the mid graft.  Preserved global LV function with small focal area of distal inferior hypocontractility; EF estimate 55 to 60%.  LVEDP 9 to 11 mm.  RECOMMENDATION: We will review with colleagues.  Since the patient has a severely diseased SVG to PDA only 2 months after insertion and has significant potential ischemia due to the 90% stenosis in the continuation branch of the PDA supplying the posterolateral territory review angiograms with colleagues and consider PCI to the native RCA.  The distal circumflex territory is severely diffusely diseased and a very small caliber which may not be amenable to intervention.  Aggressive lipid-lowering therapy with target LDL less than 70.  Plan medical therapy for concomitant CAD and will initiate ranolazine 500 mg twice a day.  Will discuss with colleagues and family members and tentatively plan intervention tomorrow.  Diagnostic Dominance: Right    Cath: 11/23/19   Ost LAD to Prox LAD lesion is  70% stenosed.  Prox LAD to Mid LAD lesion is 50% stenosed with 50% stenosed side branch in 1st Diag.  Mid Cx lesion is 99% stenosed.  2nd Mrg-2 lesion is 95% stenosed with 0% stenosed side branch in Lat 2nd Mrg.  2nd Mrg-1 lesion is 95% stenosed.  RPAV lesion is 95% stenosed.  Prox RCA to Mid RCA lesion is 80% stenosed.  Dist RCA lesion is 80% stenosed with 70% stenosed side branch in RPAV.  Origin to Prox Graft lesion is 80% stenosed.  Prox Graft to Mid Graft lesion is 70%  stenosed.  A stent was successfully placed.  Post intervention, there is a 0% residual stenosis.  Post intervention, the side branch was reduced to 0% residual stenosis.  Post intervention, there is a 0% residual stenosis.  Post intervention, there is a 0% residual stenosis.  A stent was successfully placed.  A stent was successfully placed.   Difficult but ultimately successful percutaneous coronary intervention to the native dominant RCA in a patient with documented diffuse disease in the vein graft supplying her PDA vessel.    A total of 3 stents were inserted with a 2.0 x 30 mm Resolute Onyx DES stent most distal with tandem overlap with a 2.0 x 12 mm stent to cover the 80 and 95% distal stenoses before the PDA and PLA vessels and of a 2.5 x 30 mm Resolute stent to cover the 80% stenosis in the mid vessel followed by an additional area of narrowing of 70% just proximal to the acute margin.  Prior to stenting, there was suggestion of distal RCA dissection following very low level inflation with a 2.0 x 12 balloon at the distal 95% stenosis which was successfully treated with stent insertion.  At the completion of the procedure, all areas were reduced to 0%.  There was brisk TIMI-3 flow.  There was no evidence for dissection.  Competitive filling was present down the PDA due to the SVG supplying the mid PDA region.  RECOMMENDATION: Long-term DAPT indefinitely.  Hydration post procedure in this patient with baseline renal insufficiency.  S of lipid-lowering therapy with target LDL less than 70.  Concomitant medical therapy with nitrates, and a blocker, as well as ranolazine which was added yesterday for the patient's distal circumflex disease.  Patient will follow up with Dr. Geraldo Pitter.  Diagnostic Dominance: Right  Intervention    _____________   History of Present Illness     Meghan Terry is a 59 y.o. female with a past medical history of essential hypertension, dyslipidemia,  and CAD s/p CABG (12/20).  She is an ex-smoker.  She recently underwent coronary angiography with multiple vessel stenosis and underwent CABG surgery.  She complained of chest pain at her last office visit.  It was felt this was sternal pain at the time. At her most recent office visit she reported ongoing episodes of chest pain that were responsive to SL nitro. Had a recent office visit at [facility] for chest pain and released. She had called the surgeon's office and she was instructed to call the cardiology office for follow up. Given her symptoms, there was concern regarding graft patency. She was set up for outpatient cardiac cath.   Hospital Course     Underwent cardiac cath noted above with Dr. Claiborne Billings on 11/22/19 that showed patent LIMA-LADm and SVG-distal OM. Does have 95% stenosis just beyond graft site in a very small caliber vessel. Also with diffusely diseased SVG- PDA. Images were reviewed by  Dr. Claiborne Billings and colleagues and decision was made for planned PCI of the native RCA. She was started on Ranexa post cath. Did have intermittent episodes of chest pain while waiting to undergo PCI. Had a difficult but successful PCI/DES x3 to the dominant RCA. Plan to continue on DAPT with ASA/plavix indefinitely. Her blood pressures were elevated during admission. Adjusted home meds to include metoprolol 25mg  BID->37.5mg  BID, added amlodipine 2.5mg  daily. Will plan to stop home ARB at discharge given baseline Cr of 1.3-1.4. Worked with cardiac rehab and had some back pain which improved by the end of her walk with rest.   General: Well developed, well nourished, female appearing in no acute distress. Head: Normocephalic, atraumatic.  Neck: Supple without bruits, JVD. Lungs:  Resp regular and unlabored, CTA. Heart: RRR, S1, S2, no S3, S4, or murmur; no rub. Abdomen: Soft, non-tender, non-distended with normoactive bowel sounds. No hepatomegaly. No rebound/guarding. No obvious abdominal  masses. Extremities: No clubbing, cyanosis, edema. Distal pedal pulses are 2+ bilaterally. Right radial/femoral cath site stable without bruising or hematoma Neuro: Alert and oriented X 3. Moves all extremities spontaneously. Psych: Normal affect.  Meghan Terry was seen by Dr. Ellyn Hack and determined stable for discharge home. Follow up in the office has been arranged. Medications are listed below.   _____________  Discharge Vitals Blood pressure 115/71, pulse 95, temperature 98.2 F (36.8 C), temperature source Oral, resp. rate 15, height 5\' 3"  (1.6 m), weight 65.1 kg, SpO2 97 %.  Filed Weights   11/22/19 1613 11/23/19 0444 11/24/19 0355  Weight: 64 kg 64.8 kg 65.1 kg    Labs & Radiologic Studies    CBC Recent Labs    11/23/19 0448 11/24/19 0242  WBC 6.1 5.8  HGB 10.0* 9.6*  HCT 31.2* 30.1*  MCV 89.4 90.4  PLT 204 [id number]   Basic Metabolic Panel Recent Labs    11/23/19 0448 11/24/19 0242  NA 142 138  K 3.8 3.8  CL 116* 113*  CO2 20* 17*  GLUCOSE 123* 131*  BUN 16 15  CREATININE 1.43* 1.47*  CALCIUM 8.5* 8.3*   Liver Function Tests No results for input(s): AST, ALT, ALKPHOS, BILITOT, PROT, ALBUMIN in the last 72 hours. No results for input(s): LIPASE, AMYLASE in the last 72 hours. Cardiac Enzymes No results for input(s): CKTOTAL, CKMB, CKMBINDEX, TROPONINI in the last 72 hours. BNP Invalid input(s): POCBNP D-Dimer No results for input(s): DDIMER in the last 72 hours. Hemoglobin A1C No results for input(s): HGBA1C in the last 72 hours. Fasting Lipid Panel No results for input(s): CHOL, HDL, LDLCALC, TRIG, CHOLHDL, LDLDIRECT in the last 72 hours. Thyroid Function Tests No results for input(s): TSH, T4TOTAL, T3FREE, THYROIDAB in the last 72 hours.  Invalid input(s): FREET3 _____________  DG Chest 2 View  Result Date: 11/15/2019 CLINICAL DATA:  Recent coronary artery bypass grafting EXAM: CHEST - 2 VIEW COMPARISON:  Chest radiograph and chest CT November 08, 2019 FINDINGS: Lungs are clear. The heart size and pulmonary vascularity are normal. No adenopathy. Patient is status post coronary artery bypass grafting. No adenopathy. There is aortic atherosclerosis. No bone lesions. There are surgical clips in the right upper quadrant. IMPRESSION: Postoperative changes. No edema or airspace opacity. No pneumothorax. Heart size normal. Aortic Atherosclerosis (ICD10-I70.0). Electronically Signed   By: Lowella Grip III M.D.   On: 11/15/2019 14:22    Disposition   Pt is being discharged home today in good condition.  Renal fxn stable.   Follow-up Plans &  Appointments    Follow-up Information    Revankar, Reita Cliche, MD Follow up on 12/07/2019.   Specialty: Cardiology Why: at 1:55pm for your follow up appt. Contact information: [address] [place] [address] [phone]          Discharge Instructions    Amb Referral to Cardiac Rehabilitation   Complete by: As directed    To [place]   Diagnosis:  PTCA Coronary Stents     After initial evaluation and assessments completed: Virtual Based Care may be provided alone or in conjunction with Phase 2 Cardiac Rehab based on patient barriers.: Yes   Call MD for:  redness, tenderness, or signs of infection (pain, swelling, redness, odor or green/yellow discharge around incision site)   Complete by: As directed    Diet - low sodium heart healthy   Complete by: As directed    Discharge instructions   Complete by: As directed    Radial Site Care Refer to this sheet in the next few weeks. These instructions provide you with information on caring for yourself after your procedure. Your caregiver may also give you more specific instructions. Your treatment has been planned according to current medical practices, but problems sometimes occur. Call your caregiver if you have any problems or questions after your procedure. HOME CARE INSTRUCTIONS You may shower the day after the  procedure.Remove the bandage (dressing) and gently wash the site with plain soap and water.Gently pat the site dry.  Do not apply powder or lotion to the site.  Do not submerge the affected site in water for 3 to 5 days.  Inspect the site at least twice daily.  Do not flex or bend the affected arm for 24 hours.  No lifting over 5 pounds (2.3 kg) for 5 days after your procedure.  Do not drive home if you are discharged the same day of the procedure. Have someone else drive you.  You may drive 24 hours after the procedure unless otherwise instructed by your caregiver.  What to expect: Any bruising will usually fade within 1 to 2 weeks.  Blood that collects in the tissue (hematoma) may be painful to the touch. It should usually decrease in size and tenderness within 1 to 2 weeks.  SEEK IMMEDIATE MEDICAL CARE IF: You have unusual pain at the radial site.  You have redness, warmth, swelling, or pain at the radial site.  You have drainage (other than a small amount of blood on the dressing).  You have chills.  You have a fever or persistent symptoms for more than 72 hours.  You have a fever and your symptoms suddenly get worse.  Your arm becomes pale, cool, tingly, or numb.  You have heavy bleeding from the site. Hold pressure on the site.   PLEASE DO NOT MISS ANY DOSES OF YOUR PLAVIX!!!!! Also keep a log of you blood pressures and bring back to your follow up appt. Please call the office with any questions.   Patients taking blood thinners should generally stay away from medicines like ibuprofen, Advil, Motrin, naproxen, and Aleve due to risk of stomach bleeding. You may take Tylenol as directed or talk to your primary doctor about alternatives.   Increase activity slowly   Complete by: As directed        Discharge Medications     Medication List    STOP taking these medications   losartan 25 MG tablet Commonly known as: COZAAR     TAKE these medications  acetaminophen 500 MG  tablet Commonly known as: TYLENOL Take 500-1,000 mg by mouth every 6 (six) hours as needed (for pain.).   ALPRAZolam 0.25 MG tablet Commonly known as: XANAX Take 1 tablet (0.25 mg total) by mouth at bedtime as needed for anxiety.   amLODipine 2.5 MG tablet Commonly known as: NORVASC Take 1 tablet (2.5 mg total) by mouth daily.   aspirin EC 81 MG tablet Take 1 tablet (81 mg total) by mouth daily.   buPROPion 300 MG 24 hr tablet Commonly known as: WELLBUTRIN XL Take 1 tablet (300 mg total) by mouth every morning.   clopidogrel 75 MG tablet Commonly known as: PLAVIX TAKE 1 TABLET BY MOUTH DAILY   ferrous sulfate 325 (65 FE) MG tablet Take 1 tablet (325 mg total) by mouth daily with breakfast.   isosorbide mononitrate 60 MG 24 hr tablet Commonly known as: IMDUR Take 1 tablet (60 mg total) by mouth daily.   levothyroxine 75 MCG tablet Commonly known as: SYNTHROID Take 75 mcg by mouth daily before breakfast.   Metoprolol Tartrate 37.5 MG Tabs Take 37.5 mg by mouth 2 (two) times daily. What changed:   medication strength  how much to take  when to take this   ondansetron 4 MG tablet Commonly known as: Zofran Take 1 tablet (4 mg total) by mouth every 8 (eight) hours as needed for nausea or vomiting. What changed: additional instructions   ranolazine 500 MG 12 hr tablet Commonly known as: RANEXA Take 1 tablet (500 mg total) by mouth 2 (two) times daily.   rOPINIRole 1 MG tablet Commonly known as: REQUIP Take 1 mg by mouth 3 (three) times daily.   rOPINIRole 4 MG tablet Commonly known as: REQUIP Take 4 mg by mouth at bedtime.   rosuvastatin 20 MG tablet Commonly known as: CRESTOR Take 1 tablet (20 mg total) by mouth daily at 6 PM.   topiramate 100 MG tablet Commonly known as: TOPAMAX Take 100 mg by mouth 2 (two) times daily.   traMADol 50 MG tablet Commonly known as: ULTRAM Take 1 tablet (50 mg total) by mouth every 12 (twelve) hours as needed (pain).    traZODone 150 MG tablet Commonly known as: DESYREL Take 150 mg by mouth at bedtime.        No                               Did the patient have a percutaneous coronary intervention (stent / angioplasty)?:  Yes.     Cath/PCI Registry Performance & Quality Measures: 1. Aspirin prescribed? - Yes 2. ADP Receptor Inhibitor (Plavix/Clopidogrel, Brilinta/Ticagrelor or Effient/Prasugrel) prescribed (includes medically managed patients)? - Yes 3. High Intensity Statin (Lipitor 40-80mg  or Crestor 20-40mg ) prescribed? - Yes 4. For EF <40%, was ACEI/ARB prescribed? - Not Applicable (EF >/= [id number]) 5. For EF <40%, Aldosterone Antagonist (Spironolactone or Eplerenone) prescribed? - Not Applicable (EF >/= [id number]) 6. Cardiac Rehab Phase II ordered (Included Medically managed Patients)? - Yes   Outstanding Labs/Studies   N/a   Duration of Discharge Encounter   Greater than 30 minutes including physician time.  Signed, Reino Bellis NP-C 11/24/2019, 11:17 AM  ATTENDING ATTESTATION  I have seen, examined and evaluated the patient this AM on rounds along with Reino Bellis, NP-C.  After reviewing all the available data and chart, we discussed the patients laboratory, study & physical findings as well as symptoms in detail. I agree with  her findings, examination as well as impression recommendations as per our discussion.    Ms. Migdal was brought in for outpatient cardiac catheterization for progressive angina.  She was found to have significant graft disease in the SVG-PDA along with her native disease.  The vessel vein graft to the circumflex was patent, however the graft to the OM was extremely diseased.  After discussing the treatment plan with Dr. Claiborne Billings, we determined that the best course of action will be to stent the native RCA mid as well as distal across the bifurcation into the posterior lateral branch.  This would leave the PDA jailed but still receiving some flow from the graft that is  disease.  She did have back pain and chest pain in the interim between her stenting.  This morning she woke up and had some chest discomfort as well as back discomfort.  Relieved with pain medications.  Renal Fxn remains stable after Cath x 2.   We have discharged her on Ranexa and Imdur along with amlodipine for additional antianginal benefit.  It is possible that she could have some angina as the vein graft to the PDA potentially closes off.  There did appear to be brisk antegrade flow in the PDA via native vessel after upstream stenting.  It is jailed.  He was able to walk in the hallway and had some chest discomfort upon arrival back to her room.  Her blood pressures were in the 190s so we have titrated up additional blood pressure medication as noted.  She is otherwise okay for discharge home and will follow up with Dr. Geraldo Pitter.    Glenetta Hew, M.D., M.S. Interventional Cardiologist   Pager # [phone] Phone # [phone] [address] [place], [place] [address]

## 2019-11-24 NOTE — Plan of Care (Signed)
  Problem: Education: Goal: Knowledge of General Education information will improve Description: Including pain rating scale, medication(s)/side effects and non-pharmacologic comfort measures Outcome: Adequate for Discharge   Problem: Health Behavior/Discharge Planning: Goal: Ability to manage health-related needs will improve Outcome: Adequate for Discharge   Problem: Clinical Measurements: Goal: Ability to maintain clinical measurements within normal limits will improve Outcome: Adequate for Discharge   Problem: Clinical Measurements: Goal: Will remain free from infection Outcome: Adequate for Discharge   Problem: Clinical Measurements: Goal: Diagnostic test results will improve Outcome: Adequate for Discharge   Problem: Clinical Measurements: Goal: Respiratory complications will improve Outcome: Adequate for Discharge   Problem: Clinical Measurements: Goal: Cardiovascular complication will be avoided Outcome: Adequate for Discharge   Problem: Activity: Goal: Risk for activity intolerance will decrease Outcome: Adequate for Discharge   Problem: Nutrition: Goal: Adequate nutrition will be maintained Outcome: Adequate for Discharge   Problem: Coping: Goal: Level of anxiety will decrease Outcome: Adequate for Discharge   Problem: Elimination: Goal: Will not experience complications related to bowel motility Outcome: Adequate for Discharge   Problem: Clinical Measurements: Goal: Cardiovascular complication will be avoided Outcome: Adequate for Discharge   Problem: Clinical Measurements: Goal: Respiratory complications will improve Outcome: Adequate for Discharge   Problem: Elimination: Goal: Will not experience complications related to bowel motility Outcome: Adequate for Discharge   Problem: Elimination: Goal: Will not experience complications related to urinary retention Outcome: Adequate for Discharge   Problem: Pain Managment: Goal: General experience  of comfort will improve Outcome: Adequate for Discharge   Problem: Safety: Goal: Ability to remain free from injury will improve Outcome: Adequate for Discharge   Problem: Skin Integrity: Goal: Risk for impaired skin integrity will decrease Outcome: Adequate for Discharge

## 2019-11-28 LAB — HEPATIC FUNCTION PANEL
ALT: 6 IU/L (ref 0–32)
AST: 12 IU/L (ref 0–40)
Albumin: 4 g/dL (ref 3.8–4.9)
Alkaline Phosphatase: 104 IU/L (ref 39–117)
Bilirubin Total: 0.3 mg/dL (ref 0.0–1.2)
Bilirubin, Direct: 0.1 mg/dL (ref 0.00–0.40)
Total Protein: 6 g/dL (ref 6.0–8.5)

## 2019-11-28 LAB — LIPID PANEL
Chol/HDL Ratio: 2.2 ratio (ref 0.0–4.4)
Cholesterol, Total: 145 mg/dL (ref 100–199)
HDL: 67 mg/dL (ref >39)
LDL Chol Calc (NIH): 61 mg/dL (ref 0–99)
Triglycerides: 90 mg/dL (ref 0–149)
VLDL Cholesterol Cal: 17 mg/dL (ref 5–40)

## 2019-11-29 ENCOUNTER — Encounter: Payer: Self-pay | Admitting: Surgery

## 2019-12-03 DIAGNOSIS — N183 Chronic kidney disease, stage 3 unspecified: Secondary | ICD-10-CM | POA: Diagnosis present

## 2019-12-03 HISTORY — DX: Chronic kidney disease, stage 3 unspecified: N18.30

## 2019-12-07 ENCOUNTER — Other Ambulatory Visit: Payer: Self-pay

## 2019-12-07 ENCOUNTER — Encounter: Payer: Self-pay | Admitting: Cardiology

## 2019-12-07 ENCOUNTER — Ambulatory Visit (INDEPENDENT_AMBULATORY_CARE_PROVIDER_SITE_OTHER): Payer: Commercial Managed Care - PPO | Admitting: Cardiology

## 2019-12-07 VITALS — BP 168/80 | HR 86 | Temp 98.1°F | Resp 18 | Wt 146.8 lb

## 2019-12-07 DIAGNOSIS — Z955 Presence of coronary angioplasty implant and graft: Secondary | ICD-10-CM

## 2019-12-07 DIAGNOSIS — E785 Hyperlipidemia, unspecified: Secondary | ICD-10-CM | POA: Diagnosis not present

## 2019-12-07 DIAGNOSIS — I10 Essential (primary) hypertension: Secondary | ICD-10-CM

## 2019-12-07 DIAGNOSIS — Z1329 Encounter for screening for other suspected endocrine disorder: Secondary | ICD-10-CM

## 2019-12-07 DIAGNOSIS — Z951 Presence of aortocoronary bypass graft: Secondary | ICD-10-CM

## 2019-12-07 DIAGNOSIS — I25119 Atherosclerotic heart disease of native coronary artery with unspecified angina pectoris: Secondary | ICD-10-CM | POA: Diagnosis not present

## 2019-12-07 DIAGNOSIS — Z8249 Family history of ischemic heart disease and other diseases of the circulatory system: Secondary | ICD-10-CM

## 2019-12-07 DIAGNOSIS — N183 Chronic kidney disease, stage 3 unspecified: Secondary | ICD-10-CM

## 2019-12-07 DIAGNOSIS — E088 Diabetes mellitus due to underlying condition with unspecified complications: Secondary | ICD-10-CM

## 2019-12-07 MED ORDER — ISOSORBIDE MONONITRATE ER 60 MG PO TB24: 60.0000 mg | ORAL_TABLET | Freq: Two times a day (BID) | ORAL | 3 refills | Status: DC

## 2019-12-07 NOTE — Patient Instructions (Signed)
Medication Instructions:  Your physician has recommended you make the following change in your medication:   You need to take Imdur 60 mg twice daily.  *If you need a refill on your cardiac medications before your next appointment, please call your pharmacy*   Lab Work: You had labs today. ou need to have labs done when you are fasting before your next visit.  You can come Monday through Friday 8:30 am to 12:00 pm and 1:15 to 4:30. You do not need to make an appointment as the order has already been placed. The labs you are going to have done are BMET, CBC, TSH, LFT and Lipids. If you have labs (blood work) drawn today and your tests are completely normal, you will receive your results only by: Marland Kitchen MyChart Message (if you have MyChart) OR . A paper copy in the mail If you have any lab test that is abnormal or we need to change your treatment, we will call you to review the results.   Testing/Procedures: None ordered   Follow-Up: At [facility], you and your health needs are our priority.  As part of our continuing mission to provide you with exceptional heart care, we have created designated Provider Care Teams.  These Care Teams include your primary Cardiologist (physician) and Advanced Practice Providers (APPs -  Physician Assistants and Nurse Practitioners) who all work together to provide you with the care you need, when you need it.  We recommend signing up for the patient portal called "MyChart".  Sign up information is provided on this After Visit Summary.  MyChart is used to connect with patients for Virtual Visits (Telemedicine).  Patients are able to view lab/test results, encounter notes, upcoming appointments, etc.  Non-urgent messages can be sent to your provider as well.   To learn more about what you can do with MyChart, go to [email or web address].    Your next appointment:   1 month(s)  The format for your next appointment:   In Person  Provider:   Jyl Heinz,  MD Jyl Heinz, MD   Other Instructions NA

## 2019-12-07 NOTE — Progress Notes (Signed)
Cardiology Office Note:    Date:  12/07/2019   ID:  Meghan Terry, DOB 01/14/1961, MRN [id number]  PCP:  Garwin Brothers, MD  Cardiologist:  Jenean Lindau, MD   Referring MD: Garwin Brothers, MD    ASSESSMENT:    1. Coronary artery disease involving native coronary artery of native heart with angina pectoris ([place])   2. Essential hypertension   3. Hyperlipidemia with target LDL less than 70   4. Stage 3 chronic kidney disease, unspecified whether stage 3a or 3b CKD   5. Diabetes mellitus due to underlying condition with unspecified complications ([place])   6. Family history of coronary artery disease   7. S/P CABG x 3   8. Presence of drug coated stent in right coronary artery    PLAN:    In order of problems listed above:  1. Coronary artery disease: Stable angina: Secondary prevention stressed with the patient.  Importance of compliance with diet and medication stressed and she vocalized understanding.  She is using nitroglycerin as needed and has used only nitroglycerin 1 time since intervention and that was when she was extremely stressed. 2. Essential hypertension: Blood pressure stable.  She appears anxious.  I told her to use her alprazolam to help her rest better.  I also increase her isosorbide to 60 mg twice daily and eventually I think I will make up on 20 mg once daily.  She is fine with it. 3. Mixed dyslipidemia and diabetes mellitus: Diet was discussed she will be back in the next few days before her appointment which would be in a month at that time we will do fasting blood work. 4. Renal insufficiency: She will have a Chem-7 today.  She is on multiple medications.  I told her about being careful with taking over-the-counter medications and talk to her primary care physician before she does it. 5. Sublingual nitroglycerin prescription was sent, its protocol and 911 protocol explained and the patient vocalized understanding questions were answered to the patient's  satisfaction.   Medication Adjustments/Labs and Tests Ordered: Current medicines are reviewed at length with the patient today.  Concerns regarding medicines are outlined above.  No orders of the defined types were placed in this encounter.  No orders of the defined types were placed in this encounter.    Chief Complaint  Patient presents with  . Follow-up    Pt reports feeling fatigued and pain daily.  She returned to work this past week.     History of Present Illness:    Meghan Terry is a 59 y.o. female.  Patient has past medical history of coronary artery disease, essential hypertension, dyslipidemia, diabetes mellitus and coronary artery disease.  She is post CABG surgery.  The patient subsequently has undergone stenting and the procedure was challenging according to the notes.  She has done well.  She has renal insufficiency.  She occasionally has chest tightness when she is very anxious and nervous.  She mentions to me that her work keeps her on her toes.  She mentions to me that she has to go to work because of her insurance issues.  At the time of my evaluation, the patient is alert awake oriented and in no distress.  Past Medical History:  Diagnosis Date  . Anxiety   . Coronary artery disease   . Hypertension   . Hypothyroid   . RLS (restless legs syndrome)   . Thyroid disease     Past Surgical History:  Procedure  Laterality Date  . ABDOMINAL HYSTERECTOMY    . APPENDECTOMY    . CARDIAC CATHETERIZATION  11/22/2019  . CHOLECYSTECTOMY    . COLONOSCOPY    . CORONARY ARTERY BYPASS GRAFT N/A 09/15/2019   Procedure: CORONARY ARTERY BYPASS GRAFTING (CABG) times three on pump using left internal mammary artery and right and left greater saphenous veins harvested endoscopically;  Surgeon: Gaye Pollack, MD;  Location: [place] [facility];  Service: Open Heart Surgery;  Laterality: N/A;  . CORONARY STENT INTERVENTION N/A 11/23/2019   Procedure: CORONARY STENT INTERVENTION;  Surgeon: [name], MD;  Location: [place] [facility];  Service: Cardiovascular;  Laterality: N/A;  . KNEE ARTHROSCOPY    . LEFT HEART CATH AND CORONARY ANGIOGRAPHY N/A 09/14/2019   Procedure: LEFT HEART CATH AND CORONARY ANGIOGRAPHY;  Surgeon: [name], MD;  Location: [place] [facility];  Service: Cardiovascular;  Laterality: N/A;  . LEFT HEART CATH AND CORS/GRAFTS ANGIOGRAPHY N/A 11/22/2019   Procedure: LEFT HEART CATH AND CORS/GRAFTS ANGIOGRAPHY;  Surgeon: [name], MD;  Location: [place] [facility];  Service: Cardiovascular;  Laterality: N/A;  . OSTEOCHONDRAL DEFECT REPAIR/RECONSTRUCTION Left 11/29/2014   Procedure: LEFT ANKLE MEDIAL MALLEOLUS OSTEOTOMY,AUTO GRAFT FROM CALCANEOUS;  OS TIBIA DENORO GRAFTING TALUS;  Surgeon: Wylene Simmer, MD;  Location: [facility];  Service: Orthopedics;  Laterality: Left;  . TEE WITHOUT CARDIOVERSION N/A 09/15/2019   Procedure: TRANSESOPHAGEAL ECHOCARDIOGRAM (TEE);  Surgeon: Gaye Pollack, MD;  Location: [facility];  Service: Open Heart Surgery;  Laterality: N/A;  . TUBAL LIGATION      Current Medications: Current Meds  Medication Sig  . acetaminophen (TYLENOL) 500 MG tablet Take 500-1,000 mg by mouth every 6 (six) hours as needed (for pain.).  Marland Kitchen ALPRAZolam (XANAX) 0.25 MG tablet Take 1 tablet (0.25 mg total) by mouth at bedtime as needed for anxiety.  Marland Kitchen amLODipine (NORVASC) 2.5 MG tablet Take 1 tablet (2.5 mg total) by mouth daily.  Marland Kitchen aspirin EC 81 MG tablet Take 1 tablet (81 mg total) by mouth daily.  Marland Kitchen buPROPion (WELLBUTRIN XL) 300 MG 24 hr tablet Take 1 tablet (300 mg total) by mouth every morning.  . clopidogrel (PLAVIX) 75 MG tablet TAKE 1 TABLET BY MOUTH DAILY  . ferrous sulfate 325 (65 FE) MG tablet Take 1 tablet (325 mg total) by mouth daily with breakfast.  . isosorbide mononitrate (IMDUR) 60 MG 24 hr tablet Take 1 tablet (60 mg total) by mouth daily.  Marland Kitchen levothyroxine (SYNTHROID) 75 MCG tablet Take 75 mcg by mouth daily before  breakfast.  . metoprolol tartrate 37.5 MG TABS Take 37.5 mg by mouth 2 (two) times daily.  . ondansetron (ZOFRAN) 4 MG tablet Take 1 tablet (4 mg total) by mouth every 8 (eight) hours as needed for nausea or vomiting. (Patient taking differently: Take 4 mg by mouth every 8 (eight) hours as needed for nausea or vomiting. Nausea/vomiting)  . ranolazine (RANEXA) 500 MG 12 hr tablet Take 1 tablet (500 mg total) by mouth 2 (two) times daily.  Marland Kitchen rOPINIRole (REQUIP) 1 MG tablet Take 1 mg by mouth 3 (three) times daily.   Marland Kitchen rOPINIRole (REQUIP) 4 MG tablet Take 4 mg by mouth at bedtime.  . rosuvastatin (CRESTOR) 20 MG tablet Take 1 tablet (20 mg total) by mouth daily at 6 PM.  . topiramate (TOPAMAX) 100 MG tablet Take 100 mg by mouth 2 (two) times daily.  . traMADol (ULTRAM) 50 MG tablet Take 1 tablet (50  mg total) by mouth every 12 (twelve) hours as needed (pain).  . traZODone (DESYREL) 150 MG tablet Take 150 mg by mouth at bedtime.      Allergies:   Carbidopa-levodopa, Prednisone, and Venlafaxine   Social History   Socioeconomic History  . Marital status: Married    Spouse name: Not on file  . Number of children: Not on file  . Years of education: Not on file  . Highest education level: Not on file  Occupational History  . Not on file  Tobacco Use  . Smoking status: Former Research scientist (life sciences)  . Smokeless tobacco: Never Used  Substance and Sexual Activity  . Alcohol use: No  . Drug use: No  . Sexual activity: Not on file    Comment: e-cig  Other Topics Concern  . Not on file  Social History Narrative  . Not on file   Social Determinants of Health   Financial Resource Strain:   . Difficulty of Paying Living Expenses:   Food Insecurity:   . Worried About Charity fundraiser in the Last Year:   . Arboriculturist in the Last Year:   Transportation Needs:   . Film/video editor (Medical):   Marland Kitchen Lack of Transportation (Non-Medical):   Physical Activity:   . Days of Exercise per Week:   .  Minutes of Exercise per Session:   Stress:   . Feeling of Stress :   Social Connections:   . Frequency of Communication with Friends and Family:   . Frequency of Social Gatherings with Friends and Family:   . Attends Religious Services:   . Active Member of Clubs or Organizations:   . Attends Archivist Meetings:   Marland Kitchen Marital Status:      Family History: The patient's family history includes Asthma in her mother; COPD in her mother; Congestive Heart Failure in her father; Diabetes in her mother; Macular degeneration in her mother.  ROS:   Please see the history of present illness.    All other systems reviewed and are negative.  EKGs/Labs/Other Studies Reviewed:    The following studies were reviewed today: [name], MD Study date: 11/22/19  MyChart Results Release  MyChart Status: Active Results Release  Physicians  Panel Physicians Referring Physician Case Authorizing Physician  [name], MD (Primary)    Procedures  LEFT HEART CATH AND CORS/GRAFTS ANGIOGRAPHY  Conclusion    RPAV lesion is 90% stenosed.  Prox RCA to Mid RCA lesion is 80% stenosed.  Mid Cx lesion is 99% stenosed.  2nd Mrg-1 lesion is 95% stenosed.  2nd Mrg-2 lesion is 95% stenosed with 0% stenosed side branch in Lat 2nd Mrg.  Ost LAD to Prox LAD lesion is 70% stenosed.  Prox LAD to Mid LAD lesion is 50% stenosed with 50% stenosed side branch in 1st Diag.  Origin to Prox Graft lesion is 80% stenosed.  Prox Graft to Mid Graft lesion is 70% stenosed.  LV end diastolic pressure is low.  The left ventricular ejection fraction is 55-65% by visual estimate.  Dist RCA lesion is 80% stenosed with 70% stenosed side branch in RPAV.   Severe multivessel native CAD with smooth ostial tapering of 20% at the left main, 28 and 50% proximal LAD stenosis in a calcified segment with competitive filling of the mid LAD via the LIMA graft; subtotal/total occlusion of the circumflex  vessel after a small OM1 summary: Large dominant RCA with diffuse 80% mid stenosis, 80% stenosis  proximal to the PDA takeoff and 90% stenosis in the continuation branch proximal to 2 PLA vessels.  Patent LIMA graft supplying the mid LAD.  Patent SVG supplying the distal marginal vessel however there is diffuse 95% stenosis just beyond the grafts anastomosis and a very small caliber distal circumflex vessel.  Diffuse disease in the SVG supplying the mid PDA with a long area of proximal 80% stenosis followed by an area of 70% stenosis between 2 valves in the vein extending to the mid graft.  Preserved global LV function with small focal area of distal inferior hypocontractility; EF estimate 55 to 60%.  LVEDP 9 to 11 mm.  RECOMMENDATION: We will review with colleagues.  Since the patient has a severely diseased SVG to PDA only 2 months after insertion and has significant potential ischemia due to the 90% stenosis in the continuation branch of the PDA supplying the posterolateral territory review angiograms with colleagues and consider PCI to the native RCA.  The distal circumflex territory is severely diffusely diseased and a very small caliber which may not be amenable to intervention.  Aggressive lipid-lowering therapy with target LDL less than 70.  Plan medical therapy for concomitant CAD and will initiate ranolazine 500 mg twice a day.  Will discuss with colleagues and family members and tentatively plan intervention tomorrow.   Procedures  CORONARY STENT INTERVENTION  Conclusion    Ost LAD to Prox LAD lesion is 70% stenosed.  Prox LAD to Mid LAD lesion is 50% stenosed with 50% stenosed side branch in 1st Diag.  Mid Cx lesion is 99% stenosed.  2nd Mrg-2 lesion is 95% stenosed with 0% stenosed side branch in Lat 2nd Mrg.  2nd Mrg-1 lesion is 95% stenosed.  RPAV lesion is 95% stenosed.  Prox RCA to Mid RCA lesion is 80% stenosed.  Dist RCA lesion is 80% stenosed with 70%  stenosed side branch in RPAV.  Origin to Prox Graft lesion is 80% stenosed.  Prox Graft to Mid Graft lesion is 70% stenosed.  A stent was successfully placed.  Post intervention, there is a 0% residual stenosis.  Post intervention, the side branch was reduced to 0% residual stenosis.  Post intervention, there is a 0% residual stenosis.  Post intervention, there is a 0% residual stenosis.  A stent was successfully placed.  A stent was successfully placed.   Difficult but ultimately successful percutaneous coronary intervention to the native dominant RCA in a patient with documented diffuse disease in the vein graft supplying her PDA vessel.    A total of 3 stents were inserted with a 2.0 x 30 mm Resolute Onyx DES stent most distal with tandem overlap with a 2.0 x 12 mm stent to cover the 80 and 95% distal stenoses before the PDA and PLA vessels and of a 2.5 x 30 mm Resolute stent to cover the 80% stenosis in the mid vessel followed by an additional area of narrowing of 70% just proximal to the acute margin.  Prior to stenting, there was suggestion of distal RCA dissection following very low level inflation with a 2.0 x 12 balloon at the distal 95% stenosis which was successfully treated with stent insertion.  At the completion of the procedure, all areas were reduced to 0%.  There was brisk TIMI-3 flow.  There was no evidence for dissection.  Competitive filling was present down the PDA due to the SVG supplying the mid PDA region.  RECOMMENDATION: Long-term DAPT indefinitely.  Hydration post procedure in this patient  with baseline renal insufficiency.  S of lipid-lowering therapy with target LDL less than 70.  Concomitant medical therapy with nitrates, and a blocker, as well as ranolazine which was added yesterday for the patient's distal circumflex disease.  Patient will follow up with Dr. Geraldo Pitter.          Recent Labs: 09/16/2019: Magnesium 2.4 11/24/2019: BUN 15; Creatinine,  Ser 1.47; Hemoglobin 9.6; Platelets 212; Potassium 3.8; Sodium 138 11/27/2019: ALT 6  Recent Lipid Panel    Component Value Date/Time   CHOL 145 11/27/2019 0821   TRIG 90 11/27/2019 0821   HDL 67 11/27/2019 0821   CHOLHDL 2.2 11/27/2019 0821   CHOLHDL 3.3 09/18/2019 0355   VLDL 24 09/18/2019 0355   LDLCALC 61 11/27/2019 0821    Physical Exam:    VS:  BP (!) 168/80 (BP Location: Right Arm, Patient Position: Sitting, Cuff Size: Normal)   Pulse 86   Temp 98.1 F (36.7 C)   Resp 18   Wt 146 lb 12 oz (66.6 kg)   SpO2 98%   BMI 26.00 kg/m     Wt Readings from Last 3 Encounters:  12/07/19 146 lb 12 oz (66.6 kg)  11/24/19 143 lb 9.6 oz (65.1 kg)  11/17/19 142 lb (64.4 kg)     GEN: Patient is in no acute distress HEENT: Normal NECK: No JVD; No carotid bruits LYMPHATICS: No lymphadenopathy CARDIAC: Hear sounds regular, 2/6 systolic murmur at the apex. RESPIRATORY:  Clear to auscultation without rales, wheezing or rhonchi  ABDOMEN: Soft, non-tender, non-distended MUSCULOSKELETAL:  No edema; No deformity  SKIN: Warm and dry NEUROLOGIC:  Alert and oriented x 3 PSYCHIATRIC:  Normal affect   Signed, Jenean Lindau, MD  12/07/2019 2:43 PM    [facility]

## 2019-12-08 LAB — BASIC METABOLIC PANEL
BUN/Creatinine Ratio: 13 (ref 12–28)
BUN: 19 mg/dL (ref 8–27)
CO2: 20 mmol/L (ref 20–29)
Calcium: 8.9 mg/dL (ref 8.7–10.3)
Chloride: 109 mmol/L — ABNORMAL HIGH (ref 96–106)
Creatinine, Ser: 1.45 mg/dL — ABNORMAL HIGH (ref 0.57–1.00)
GFR calc Af Amer: 45 mL/min/{1.73_m2} — ABNORMAL LOW (ref >59)
GFR calc non Af Amer: 39 mL/min/{1.73_m2} — ABNORMAL LOW (ref >59)
Glucose: 107 mg/dL — ABNORMAL HIGH (ref 65–99)
Potassium: 4 mmol/L (ref 3.5–5.2)
Sodium: 139 mmol/L (ref 134–144)

## 2019-12-11 ENCOUNTER — Other Ambulatory Visit: Payer: Self-pay | Admitting: Cardiology

## 2019-12-11 ENCOUNTER — Other Ambulatory Visit: Payer: Self-pay | Admitting: Physician Assistant

## 2019-12-11 ENCOUNTER — Ambulatory Visit: Payer: Commercial Managed Care - PPO | Admitting: Cardiology

## 2019-12-11 DIAGNOSIS — I2510 Atherosclerotic heart disease of native coronary artery without angina pectoris: Secondary | ICD-10-CM

## 2020-01-01 NOTE — Telephone Encounter (Signed)
I spoke with [facility] pharmacy. They have prescription from 3/11 for twice daily Imdur and will fill for patient. They do not have enough to fill complete prescription but will get partial prescription ready for patient.  Message sent to patient through my chart.

## 2020-01-08 ENCOUNTER — Other Ambulatory Visit: Payer: Self-pay

## 2020-01-09 ENCOUNTER — Other Ambulatory Visit: Payer: Self-pay

## 2020-01-09 ENCOUNTER — Encounter: Payer: Self-pay | Admitting: Cardiology

## 2020-01-09 ENCOUNTER — Ambulatory Visit: Payer: Commercial Managed Care - PPO | Admitting: Cardiology

## 2020-01-09 VITALS — BP 138/62 | HR 78 | Temp 97.0°F | Ht 63.0 in | Wt 147.0 lb

## 2020-01-09 DIAGNOSIS — I10 Essential (primary) hypertension: Secondary | ICD-10-CM | POA: Diagnosis not present

## 2020-01-09 DIAGNOSIS — E088 Diabetes mellitus due to underlying condition with unspecified complications: Secondary | ICD-10-CM

## 2020-01-09 DIAGNOSIS — Z87891 Personal history of nicotine dependence: Secondary | ICD-10-CM

## 2020-01-09 DIAGNOSIS — E785 Hyperlipidemia, unspecified: Secondary | ICD-10-CM

## 2020-01-09 DIAGNOSIS — I25119 Atherosclerotic heart disease of native coronary artery with unspecified angina pectoris: Secondary | ICD-10-CM

## 2020-01-09 DIAGNOSIS — N1832 Chronic kidney disease, stage 3b: Secondary | ICD-10-CM | POA: Diagnosis not present

## 2020-01-09 DIAGNOSIS — Z955 Presence of coronary angioplasty implant and graft: Secondary | ICD-10-CM

## 2020-01-09 DIAGNOSIS — Z951 Presence of aortocoronary bypass graft: Secondary | ICD-10-CM

## 2020-01-09 NOTE — Progress Notes (Signed)
Cardiology Office Note:    Date:  01/09/2020   ID:  Meghan Terry, DOB 1961-06-18, MRN [id number]  PCP:  Garwin Brothers, MD  Cardiologist:  Jenean Lindau, MD   Referring MD: Garwin Brothers, MD    ASSESSMENT:    1. Coronary artery disease involving native coronary artery of native heart with angina pectoris ([place])   2. Essential hypertension   3. Hyperlipidemia with target LDL less than 70   4. Stage 3b chronic kidney disease   5. Ex-smoker   6. Diabetes mellitus due to underlying condition with unspecified complications ([place])   7. Presence of drug coated stent in right coronary artery   8. S/P CABG x 3    PLAN:    In order of problems listed above:  1. Coronary artery disease: Secondary prevention stressed with the patient.  Importance of compliance with diet medication stressed and she vocalized understanding.  She does not have a set exercise protocol and I discussed with her graded exercise and getting to a point of 30 minutes a day 5 days a week and she promises to do so. 2. Essential hypertension: Blood pressure is stable and she checks it on a regular basis at home 3. Mixed dyslipidemia: Lipids were reviewed and they are fine from recent evaluation 4. Diabetes mellitus: Diet was emphasized.  This is managed by her primary care physician 5. Patient will be seen in follow-up appointment in 3 months or earlier if the patient has any concerns.  She will come for follow-up appointment at that time but will come for blood work a couple of days before her appointment.  Patient had multiple questions which were answered to her satisfaction.   Medication Adjustments/Labs and Tests Ordered: Current medicines are reviewed at length with the patient today.  Concerns regarding medicines are outlined above.  Orders Placed This Encounter  Procedures  . Basic metabolic panel  . CBC with Differential/Platelet  . Hepatic function panel  . Lipid panel  . TSH   No orders of the defined types  were placed in this encounter.    No chief complaint on file.    History of Present Illness:    Meghan Terry is a 59 y.o. female.  Patient has past medical history of coronary artery disease post CABG and stenting.  She denies any problems at this time and takes care of activities of daily living.  No chest pain orthopnea or PND.  She has history of essential hypertension dyslipidemia and diabetes mellitus.  At the time of my evaluation, the patient is alert awake oriented and in no distress.  Past Medical History:  Diagnosis Date  . Angina pectoris ([place]) 11/17/2019  . Anxiety   . CKD (chronic kidney disease) stage 3b, GFR 30-59 ml/min 12/03/2019  . Coronary artery disease   . Coronary artery disease involving native coronary artery of native heart with angina pectoris ([place]) 10/03/2019  . Depression 04/25/2017  . Diabetes mellitus due to underlying condition with unspecified complications ([place]) 94/09/7406  . Essential hypertension 09/05/2019  . Ex-smoker 09/05/2019  . Family history of coronary artery disease 09/05/2019  . Hyperlipidemia with target LDL less than 70 11/24/2019  . Hypertension   . Hypothyroid   . Migraine 04/25/2017  . 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 Presence of drug coated stent in right coronary artery 11/24/2019  . Pyelonephritis 05/20/2016  . RLS (restless legs syndrome)   . 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 Thyroid disease  Past Surgical History:  Procedure Laterality Date  . ABDOMINAL HYSTERECTOMY    . APPENDECTOMY    . CARDIAC CATHETERIZATION  11/22/2019  . CHOLECYSTECTOMY    . COLONOSCOPY    . CORONARY ARTERY BYPASS GRAFT N/A 09/15/2019   Procedure: CORONARY ARTERY BYPASS GRAFTING (CABG) times three on pump using left internal mammary artery and right and left greater saphenous veins harvested endoscopically;  Surgeon: Gaye Pollack, MD;  Location: [facility];  Service: Open Heart Surgery;  Laterality: N/A;  . CORONARY STENT INTERVENTION N/A 11/23/2019   Procedure:  CORONARY STENT INTERVENTION;  Surgeon: [name], MD;  Location: [facility];  Service: Cardiovascular;  Laterality: N/A;  . KNEE ARTHROSCOPY    . LEFT HEART CATH AND CORONARY ANGIOGRAPHY N/A 09/14/2019   Procedure: LEFT HEART CATH AND CORONARY ANGIOGRAPHY;  Surgeon: [name], MD;  Location: [facility];  Service: Cardiovascular;  Laterality: N/A;  . LEFT HEART CATH AND CORS/GRAFTS ANGIOGRAPHY N/A 11/22/2019   Procedure: LEFT HEART CATH AND CORS/GRAFTS ANGIOGRAPHY;  Surgeon: [name], MD;  Location: [facility];  Service: Cardiovascular;  Laterality: N/A;  . OSTEOCHONDRAL DEFECT REPAIR/RECONSTRUCTION Left 11/29/2014   Procedure: LEFT ANKLE MEDIAL MALLEOLUS OSTEOTOMY,AUTO GRAFT FROM CALCANEOUS;  OS TIBIA DENORO GRAFTING TALUS;  Surgeon: Wylene Simmer, MD;  Location: [facility];  Service: Orthopedics;  Laterality: Left;  . TEE WITHOUT CARDIOVERSION N/A 09/15/2019   Procedure: TRANSESOPHAGEAL ECHOCARDIOGRAM (TEE);  Surgeon: Gaye Pollack, MD;  Location: [facility];  Service: Open Heart Surgery;  Laterality: N/A;  . TUBAL LIGATION      Current Medications: Current Meds  Medication Sig  . acetaminophen (TYLENOL) 500 MG tablet Take 500-1,000 mg by mouth every 6 (six) hours as needed (for pain.).  Marland Kitchen amLODipine (NORVASC) 2.5 MG tablet Take 1 tablet (2.5 mg total) by mouth daily.  Marland Kitchen aspirin EC 81 MG tablet Take 1 tablet (81 mg total) by mouth daily.  Marland Kitchen buPROPion (WELLBUTRIN XL) 300 MG 24 hr tablet Take 1 tablet (300 mg total) by mouth every morning.  . clopidogrel (PLAVIX) 75 MG tablet TAKE 1 TABLET BY MOUTH DAILY  . ferrous sulfate 325 (65 FE) MG tablet Take 1 tablet (325 mg total) by mouth daily with breakfast.  . isosorbide mononitrate (IMDUR) 60 MG 24 hr tablet Take 1 tablet (60 mg total) by mouth in the morning and at bedtime.  Marland Kitchen levothyroxine (SYNTHROID) 75 MCG tablet Take 75 mcg by mouth daily before breakfast.  . metoprolol tartrate  (LOPRESSOR) 25 MG tablet Take 37.5 mg by mouth 2 (two) times daily.  . ondansetron (ZOFRAN) 4 MG tablet Take 1 tablet (4 mg total) by mouth every 8 (eight) hours as needed for nausea or vomiting.  . ranolazine (RANEXA) 500 MG 12 hr tablet Take 1 tablet (500 mg total) by mouth 2 (two) times daily.  Marland Kitchen rOPINIRole (REQUIP) 1 MG tablet Take 1 mg by mouth 3 (three) times daily.   Marland Kitchen rOPINIRole (REQUIP) 4 MG tablet Take 4 mg by mouth at bedtime.  . rosuvastatin (CRESTOR) 20 MG tablet TAKE 1 TABLET BY MOUTH DAILY AT 6PM  . topiramate (TOPAMAX) 100 MG tablet Take 100 mg by mouth 2 (two) times daily.  . traMADol (ULTRAM) 50 MG tablet Take 1 tablet (50 mg total) by mouth every 12 (twelve) hours as needed (pain).  . traZODone (DESYREL) 150 MG tablet Take 150 mg by mouth at bedtime.      Allergies:  Carbidopa-levodopa, Prednisone, and Venlafaxine   Social History   Socioeconomic History  . Marital status: Married    Spouse name: Not on file  . Number of children: Not on file  . Years of education: Not on file  . Highest education level: Not on file  Occupational History  . Not on file  Tobacco Use  . Smoking status: Former Research scientist (life sciences)  . Smokeless tobacco: Never Used  Substance and Sexual Activity  . Alcohol use: No  . Drug use: No  . Sexual activity: Not on file    Comment: e-cig  Other Topics Concern  . Not on file  Social History Narrative  . Not on file   Social Determinants of Health   Financial Resource Strain:   . Difficulty of Paying Living Expenses:   Food Insecurity:   . Worried About Charity fundraiser in the Last Year:   . Arboriculturist in the Last Year:   Transportation Needs:   . Film/video editor (Medical):   Marland Kitchen Lack of Transportation (Non-Medical):   Physical Activity:   . Days of Exercise per Week:   . Minutes of Exercise per Session:   Stress:   . Feeling of Stress :   Social Connections:   . Frequency of Communication with Friends and Family:   . Frequency  of Social Gatherings with Friends and Family:   . Attends Religious Services:   . Active Member of Clubs or Organizations:   . Attends Archivist Meetings:   Marland Kitchen Marital Status:      Family History: The patient's family history includes Asthma in her mother; COPD in her mother; Congestive Heart Failure in her father; Diabetes in her mother; Macular degeneration in her mother.  ROS:   Please see the history of present illness.    All other systems reviewed and are negative.  EKGs/Labs/Other Studies Reviewed:    The following studies were reviewed today: Panel Physicians Referring Physician Case Authorizing Physician  [name], MD (Primary)    Procedures  CORONARY STENT INTERVENTION  Conclusion    Ost LAD to Prox LAD lesion is 70% stenosed.  Prox LAD to Mid LAD lesion is 50% stenosed with 50% stenosed side branch in 1st Diag.  Mid Cx lesion is 99% stenosed.  2nd Mrg-2 lesion is 95% stenosed with 0% stenosed side branch in Lat 2nd Mrg.  2nd Mrg-1 lesion is 95% stenosed.  RPAV lesion is 95% stenosed.  Prox RCA to Mid RCA lesion is 80% stenosed.  Dist RCA lesion is 80% stenosed with 70% stenosed side branch in RPAV.  Origin to Prox Graft lesion is 80% stenosed.  Prox Graft to Mid Graft lesion is 70% stenosed.  A stent was successfully placed.  Post intervention, there is a 0% residual stenosis.  Post intervention, the side branch was reduced to 0% residual stenosis.  Post intervention, there is a 0% residual stenosis.  Post intervention, there is a 0% residual stenosis.  A stent was successfully placed.  A stent was successfully placed.   Difficult but ultimately successful percutaneous coronary intervention to the native dominant RCA in a patient with documented diffuse disease in the vein graft supplying her PDA vessel.    A total of 3 stents were inserted with a 2.0 x 30 mm Resolute Onyx DES stent most distal with tandem overlap with a 2.0  x 12 mm stent to cover the 80 and 95% distal stenoses before the PDA and PLA vessels  and of a 2.5 x 30 mm Resolute stent to cover the 80% stenosis in the mid vessel followed by an additional area of narrowing of 70% just proximal to the acute margin.  Prior to stenting, there was suggestion of distal RCA dissection following very low level inflation with a 2.0 x 12 balloon at the distal 95% stenosis which was successfully treated with stent insertion.  At the completion of the procedure, all areas were reduced to 0%.  There was brisk TIMI-3 flow.  There was no evidence for dissection.  Competitive filling was present down the PDA due to the SVG supplying the mid PDA region.  RECOMMENDATION: Long-term DAPT indefinitely.  Hydration post procedure in this patient with baseline renal insufficiency.  S of lipid-lowering therapy with target LDL less than 70.  Concomitant medical therapy with nitrates, and a blocker, as well as ranolazine which was added yesterday for the patient's distal circumflex disease.  Patient will follow up with Dr. Geraldo Pitter.       Recent Labs: 09/16/2019: Magnesium 2.4 11/24/2019: Hemoglobin 9.6; Platelets 212 11/27/2019: ALT 6 12/07/2019: BUN 19; Creatinine, Ser 1.45; Potassium 4.0; Sodium 139  Recent Lipid Panel    Component Value Date/Time   CHOL 145 11/27/2019 0821   TRIG 90 11/27/2019 0821   HDL 67 11/27/2019 0821   CHOLHDL 2.2 11/27/2019 0821   CHOLHDL 3.3 09/18/2019 0355   VLDL 24 09/18/2019 0355   LDLCALC 61 11/27/2019 0821    Physical Exam:    VS:  BP 138/62   Pulse 78   Temp (!) 97 F (36.1 C)   Ht 5\' 3"  (1.6 m)   Wt 147 lb (66.7 kg)   SpO2 99%   BMI 26.04 kg/m     Wt Readings from Last 3 Encounters:  01/09/20 147 lb (66.7 kg)  12/07/19 146 lb 12 oz (66.6 kg)  11/24/19 143 lb 9.6 oz (65.1 kg)     GEN: Patient is in no acute distress HEENT: Normal NECK: No JVD; No carotid bruits LYMPHATICS: No lymphadenopathy CARDIAC: Hear sounds regular, 2/6  systolic murmur at the apex. RESPIRATORY:  Clear to auscultation without rales, wheezing or rhonchi  ABDOMEN: Soft, non-tender, non-distended MUSCULOSKELETAL:  No edema; No deformity  SKIN: Warm and dry NEUROLOGIC:  Alert and oriented x 3 PSYCHIATRIC:  Normal affect   Signed, Jenean Lindau, MD  01/09/2020 8:26 AM    [place]

## 2020-01-09 NOTE — Patient Instructions (Signed)
Medication Instructions:  No medication changes. *If you need a refill on your cardiac medications before your next appointment, please call your pharmacy*   Lab Work: Your physician recommends that you return for lab work in the next few days. You need to have labs done when you are fasting.  You can come Monday through Friday 8:30 am to 12:00 pm and 1:15 to 4:30. You do not need to make an appointment as the order has already been placed. The labs you are going to have done are BMET, CBC, TSH, LFT and Lipids.   If you have labs (blood work) drawn today and your tests are completely normal, you will receive your results only by: Marland Kitchen MyChart Message (if you have MyChart) OR . A paper copy in the mail If you have any lab test that is abnormal or we need to change your treatment, we will call you to review the results.   Testing/Procedures: None ordered   Follow-Up: At [facility], you and your health needs are our priority.  As part of our continuing mission to provide you with exceptional heart care, we have created designated Provider Care Teams.  These Care Teams include your primary Cardiologist (physician) and Advanced Practice Providers (APPs -  Physician Assistants and Nurse Practitioners) who all work together to provide you with the care you need, when you need it.  We recommend signing up for the patient portal called "MyChart".  Sign up information is provided on this After Visit Summary.  MyChart is used to connect with patients for Virtual Visits (Telemedicine).  Patients are able to view lab/test results, encounter notes, upcoming appointments, etc.  Non-urgent messages can be sent to your provider as well.   To learn more about what you can do with MyChart, go to [email or web address].    Your next appointment:   3 month(s)  The format for your next appointment:   In Person  Provider:   Jyl Heinz, MD   Other Instructions NA

## 2020-02-01 ENCOUNTER — Other Ambulatory Visit: Payer: Self-pay | Admitting: Cardiology

## 2020-02-19 ENCOUNTER — Other Ambulatory Visit: Payer: Self-pay | Admitting: Cardiology

## 2020-03-12 ENCOUNTER — Observation Stay (HOSPITAL_COMMUNITY)
Admission: AD | Admit: 2020-03-12 | Discharge: 2020-03-13 | Disposition: A | Discharge status: Home / Self Care | Payer: Commercial Managed Care - PPO | Source: Other Acute Inpatient Hospital | Attending: Cardiology | Admitting: Cardiology

## 2020-03-12 DIAGNOSIS — N1832 Chronic kidney disease, stage 3b: Secondary | ICD-10-CM | POA: Insufficient documentation

## 2020-03-12 DIAGNOSIS — Z7982 Long term (current) use of aspirin: Secondary | ICD-10-CM | POA: Diagnosis not present

## 2020-03-12 DIAGNOSIS — F329 Major depressive disorder, single episode, unspecified: Secondary | ICD-10-CM | POA: Insufficient documentation

## 2020-03-12 DIAGNOSIS — Z20822 Contact with and (suspected) exposure to covid-19: Secondary | ICD-10-CM | POA: Diagnosis not present

## 2020-03-12 DIAGNOSIS — I129 Hypertensive chronic kidney disease with stage 1 through stage 4 chronic kidney disease, or unspecified chronic kidney disease: Secondary | ICD-10-CM | POA: Insufficient documentation

## 2020-03-12 DIAGNOSIS — Z7902 Long term (current) use of antithrombotics/antiplatelets: Secondary | ICD-10-CM | POA: Diagnosis not present

## 2020-03-12 DIAGNOSIS — F419 Anxiety disorder, unspecified: Secondary | ICD-10-CM | POA: Insufficient documentation

## 2020-03-12 DIAGNOSIS — I25110 Atherosclerotic heart disease of native coronary artery with unstable angina pectoris: Principal | ICD-10-CM | POA: Insufficient documentation

## 2020-03-12 DIAGNOSIS — I25119 Atherosclerotic heart disease of native coronary artery with unspecified angina pectoris: Secondary | ICD-10-CM | POA: Diagnosis present

## 2020-03-12 DIAGNOSIS — I200 Unstable angina: Secondary | ICD-10-CM | POA: Diagnosis present

## 2020-03-12 DIAGNOSIS — N183 Chronic kidney disease, stage 3 unspecified: Secondary | ICD-10-CM | POA: Diagnosis present

## 2020-03-12 DIAGNOSIS — Z951 Presence of aortocoronary bypass graft: Secondary | ICD-10-CM

## 2020-03-12 DIAGNOSIS — Z888 Allergy status to other drugs, medicaments and biological substances status: Secondary | ICD-10-CM | POA: Insufficient documentation

## 2020-03-12 DIAGNOSIS — I10 Essential (primary) hypertension: Secondary | ICD-10-CM | POA: Diagnosis present

## 2020-03-12 DIAGNOSIS — E039 Hypothyroidism, unspecified: Secondary | ICD-10-CM | POA: Diagnosis not present

## 2020-03-12 DIAGNOSIS — Z79899 Other long term (current) drug therapy: Secondary | ICD-10-CM | POA: Diagnosis not present

## 2020-03-12 DIAGNOSIS — E1122 Type 2 diabetes mellitus with diabetic chronic kidney disease: Secondary | ICD-10-CM | POA: Insufficient documentation

## 2020-03-12 DIAGNOSIS — Z79890 Hormone replacement therapy (postmenopausal): Secondary | ICD-10-CM | POA: Insufficient documentation

## 2020-03-12 DIAGNOSIS — G2581 Restless legs syndrome: Secondary | ICD-10-CM | POA: Insufficient documentation

## 2020-03-12 DIAGNOSIS — Z87891 Personal history of nicotine dependence: Secondary | ICD-10-CM | POA: Insufficient documentation

## 2020-03-12 DIAGNOSIS — E785 Hyperlipidemia, unspecified: Secondary | ICD-10-CM | POA: Diagnosis not present

## 2020-03-12 LAB — TSH: TSH: 0.932 u[IU]/mL (ref 0.350–4.500)

## 2020-03-12 LAB — CBC
HCT: 35.2 % — ABNORMAL LOW (ref 36.0–46.0)
Hemoglobin: 11.6 g/dL — ABNORMAL LOW (ref 12.0–15.0)
MCH: 29.9 pg (ref 26.0–34.0)
MCHC: 33 g/dL (ref 30.0–36.0)
MCV: 90.7 fL (ref 80.0–100.0)
Platelets: 230 10*3/uL (ref 150–400)
RBC: 3.88 MIL/uL (ref 3.87–5.11)
RDW: 12.9 % (ref 11.5–15.5)
WBC: 6 10*3/uL (ref 4.0–10.5)
nRBC: 0 % (ref 0.0–0.2)

## 2020-03-12 LAB — HIV ANTIBODY (ROUTINE TESTING W REFLEX): HIV Screen 4th Generation wRfx: NONREACTIVE

## 2020-03-12 LAB — TROPONIN I (HIGH SENSITIVITY): Troponin I (High Sensitivity): 3 ng/L (ref <18)

## 2020-03-12 LAB — CREATININE, SERUM
Creatinine, Ser: 1.68 mg/dL — ABNORMAL HIGH (ref 0.44–1.00)
GFR calc Af Amer: 38 mL/min — ABNORMAL LOW (ref >60)
GFR calc non Af Amer: 33 mL/min — ABNORMAL LOW (ref >60)

## 2020-03-12 LAB — SARS CORONAVIRUS 2 BY RT PCR (HOSPITAL ORDER, PERFORMED IN ~~LOC~~ HOSPITAL LAB): SARS Coronavirus 2: NEGATIVE

## 2020-03-12 MED ORDER — TRAZODONE HCL 50 MG PO TABS
150.0000 mg | ORAL_TABLET | Freq: Every day | ORAL | Status: DC
  Administered 2020-03-12: 150 mg via ORAL
  Filled 2020-03-12: qty 1

## 2020-03-12 MED ORDER — RANOLAZINE ER 500 MG PO TB12
500.0000 mg | ORAL_TABLET | Freq: Two times a day (BID) | ORAL | Status: DC
  Administered 2020-03-12 – 2020-03-13 (×2): 500 mg via ORAL
  Filled 2020-03-12 (×2): qty 1

## 2020-03-12 MED ORDER — ROPINIROLE HCL 1 MG PO TABS
4.0000 mg | ORAL_TABLET | Freq: Every day | ORAL | Status: DC
  Administered 2020-03-12: 4 mg via ORAL
  Filled 2020-03-12: qty 4

## 2020-03-12 MED ORDER — ASPIRIN EC 81 MG PO TBEC
81.0000 mg | DELAYED_RELEASE_TABLET | Freq: Every day | ORAL | Status: DC
  Filled 2020-03-12: qty 1

## 2020-03-12 MED ORDER — ROSUVASTATIN CALCIUM 20 MG PO TABS
40.0000 mg | ORAL_TABLET | Freq: Every day | ORAL | Status: DC
  Administered 2020-03-12: 40 mg via ORAL
  Filled 2020-03-12: qty 2

## 2020-03-12 MED ORDER — ROSUVASTATIN CALCIUM 20 MG PO TABS: 40.0000 mg | ORAL_TABLET | Freq: Every day | ORAL | Status: DC

## 2020-03-12 MED ORDER — NITROGLYCERIN IN D5W 200-5 MCG/ML-% IV SOLN
0.0000 ug/min | INTRAVENOUS | Status: DC
  Administered 2020-03-12: 5 ug/min via INTRAVENOUS
  Filled 2020-03-12: qty 250

## 2020-03-12 MED ORDER — SODIUM CHLORIDE 0.9 % IV SOLN: 250.0000 mL | INTRAVENOUS | Status: DC | PRN

## 2020-03-12 MED ORDER — ACETAMINOPHEN 325 MG PO TABS: 650.0000 mg | ORAL_TABLET | ORAL | Status: DC | PRN

## 2020-03-12 MED ORDER — NITROGLYCERIN 0.4 MG SL SUBL: 0.4000 mg | SUBLINGUAL_TABLET | SUBLINGUAL | Status: DC | PRN

## 2020-03-12 MED ORDER — LEVOTHYROXINE SODIUM 75 MCG PO TABS
75.0000 ug | ORAL_TABLET | Freq: Every day | ORAL | Status: DC
  Administered 2020-03-13: 75 ug via ORAL
  Filled 2020-03-12: qty 1

## 2020-03-12 MED ORDER — ASPIRIN 81 MG PO CHEW
81.0000 mg | CHEWABLE_TABLET | ORAL | Status: AC
  Administered 2020-03-13: 81 mg via ORAL
  Filled 2020-03-12: qty 1

## 2020-03-12 MED ORDER — SODIUM CHLORIDE 0.9 % WEIGHT BASED INFUSION
3.0000 mL/kg/h | INTRAVENOUS | Status: DC
  Administered 2020-03-13: 3 mL/kg/h via INTRAVENOUS

## 2020-03-12 MED ORDER — CLOPIDOGREL BISULFATE 75 MG PO TABS
75.0000 mg | ORAL_TABLET | Freq: Every day | ORAL | Status: DC
  Administered 2020-03-13: 75 mg via ORAL
  Filled 2020-03-12 (×2): qty 1

## 2020-03-12 MED ORDER — AMLODIPINE BESYLATE 2.5 MG PO TABS
2.5000 mg | ORAL_TABLET | Freq: Every day | ORAL | Status: DC
  Administered 2020-03-13: 2.5 mg via ORAL
  Filled 2020-03-12: qty 1

## 2020-03-12 MED ORDER — ONDANSETRON HCL 4 MG/2ML IJ SOLN: 4.0000 mg | Freq: Four times a day (QID) | INTRAMUSCULAR | Status: DC | PRN

## 2020-03-12 MED ORDER — METOPROLOL TARTRATE 25 MG PO TABS
37.5000 mg | ORAL_TABLET | Freq: Two times a day (BID) | ORAL | Status: DC
  Administered 2020-03-12: 37.5 mg via ORAL
  Filled 2020-03-12: qty 1

## 2020-03-12 MED ORDER — ONDANSETRON HCL 4 MG PO TABS: 4.0000 mg | ORAL_TABLET | Freq: Three times a day (TID) | ORAL | Status: DC | PRN

## 2020-03-12 MED ORDER — SODIUM CHLORIDE 0.9% FLUSH: 3.0000 mL | INTRAVENOUS | Status: DC | PRN

## 2020-03-12 MED ORDER — ROPINIROLE HCL 1 MG PO TABS
1.0000 mg | ORAL_TABLET | Freq: Three times a day (TID) | ORAL | Status: DC
  Administered 2020-03-13 (×2): 1 mg via ORAL
  Filled 2020-03-12 (×2): qty 1

## 2020-03-12 MED ORDER — ROPINIROLE HCL 1 MG PO TABS: 1.0000 mg | ORAL_TABLET | Freq: Three times a day (TID) | ORAL | Status: DC

## 2020-03-12 MED ORDER — TOPIRAMATE 100 MG PO TABS
100.0000 mg | ORAL_TABLET | Freq: Two times a day (BID) | ORAL | Status: DC
  Administered 2020-03-12 – 2020-03-13 (×2): 100 mg via ORAL
  Filled 2020-03-12 (×2): qty 1

## 2020-03-12 MED ORDER — FERROUS SULFATE 325 (65 FE) MG PO TABS
325.0000 mg | ORAL_TABLET | Freq: Every day | ORAL | Status: DC
  Administered 2020-03-13: 325 mg via ORAL
  Filled 2020-03-12: qty 1

## 2020-03-12 MED ORDER — SODIUM CHLORIDE 0.9% FLUSH
3.0000 mL | Freq: Two times a day (BID) | INTRAVENOUS | Status: DC
  Administered 2020-03-12: 3 mL via INTRAVENOUS

## 2020-03-12 MED ORDER — BUPROPION HCL ER (XL) 150 MG PO TB24
300.0000 mg | ORAL_TABLET | Freq: Every day | ORAL | Status: DC
  Administered 2020-03-13: 300 mg via ORAL
  Filled 2020-03-12: qty 2

## 2020-03-12 MED ORDER — SODIUM CHLORIDE 0.9 % WEIGHT BASED INFUSION
1.0000 mL/kg/h | INTRAVENOUS | Status: DC
  Administered 2020-03-13: 1 mL/kg/h via INTRAVENOUS

## 2020-03-12 MED ORDER — HEPARIN SODIUM (PORCINE) 5000 UNIT/ML IJ SOLN
5000.0000 [IU] | Freq: Three times a day (TID) | INTRAMUSCULAR | Status: DC
  Administered 2020-03-12 – 2020-03-13 (×2): 5000 [IU] via SUBCUTANEOUS
  Filled 2020-03-12 (×2): qty 1

## 2020-03-12 NOTE — H&P (Addendum)
Cardiology Admission History and Physical:   Patient ID: Meghan Terry MRN: [id number]; DOB: 1961-08-22   Admission date: 03/12/2020  Primary Care Provider: Garwin Brothers, MD [facility] Cardiologist: Jenean Lindau, MD  Chief Complaint: Chest tightness/pressure  Patient Profile:   Meghan Terry is a 59 y.o. female with CAD s/p CABG December 2020 (LIMA to LAD, SVG to distal OM and SVG to PDA) with early graft failure (SVG to PDA) January 2021 s/p PCI to native RCA, hypertension, hyperlipidemia, CKD stage III, and prior smoker transfer hospital for chest pain evaluation.  Hx of CAD s/p CABG x 31 August 2019 with early graft failure of SVG to PDA requiring stenting to native RCA January 2021.  Recommended medical therapy for distal circumflex disease.  Detailed cath reports as below.   Last seen by Dr. Salley Scarlet 01/09/2020.   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  While at work,  patient had substernal chest pressure.  Radiating to her back and associated with nausea.  She took 1 sublingual nitroglycerin with minimal improvement of her pain.  This is noted by PA at curbside and advised to be evaluated in ER.  EKG without acute ischemic changes.  Troponin negative.  Hemoglobin 12.  Serum creatinine 1.5.  Potassium 4.1.  Normal liver function.  At [facility] patient reports 4 out of 10 chest pressure with intermittent sharp sensation.  Patient reports symptoms similar to prior angina when she had PCI and CABG.  Denies regular exercise but active at work.  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  She requires to stop multiple times.  Usually, she takes 2 sublingual nitroglycerin per week with resolution of her symptoms however her symptoms did not go away today after 1 sublingual nitroglycerin.  She reports compliance with her medication.  Smokes vapor occasionally.  No illicit drug use or alcohol abuse.   Past Medical History:    Diagnosis Date  . Angina pectoris ([place]) 11/17/2019  . Anxiety   . CKD (chronic kidney disease) stage 3b, GFR 30-59 ml/min 12/03/2019  . Coronary artery disease   . Coronary artery disease involving native coronary artery of native heart with angina pectoris ([place]) 10/03/2019  . Depression 04/25/2017  . Diabetes mellitus due to underlying condition with unspecified complications ([place]) 05/04/5783  . Essential hypertension 09/05/2019  . Ex-smoker 09/05/2019  . Family history of coronary artery disease 09/05/2019  . Hyperlipidemia with target LDL less than 70 11/24/2019  . Hypertension   . Hypothyroid   . Migraine 04/25/2017  . 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 Presence of drug coated stent in right coronary artery 11/24/2019  . Pyelonephritis 05/20/2016  . RLS (restless legs syndrome)   . 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 Thyroid disease     Past Surgical History:  Procedure Laterality Date  . ABDOMINAL HYSTERECTOMY    . APPENDECTOMY    . CARDIAC CATHETERIZATION  11/22/2019  . CHOLECYSTECTOMY    . COLONOSCOPY    . CORONARY ARTERY BYPASS GRAFT N/A 09/15/2019   Procedure: CORONARY ARTERY BYPASS GRAFTING (CABG) times three on pump using left internal mammary artery and right and left greater saphenous veins harvested endoscopically;  Surgeon: Gaye Pollack, MD;  Location: [place] OR;  Service: Open Heart Surgery;  Laterality: N/A;  . CORONARY STENT INTERVENTION N/A 11/23/2019   Procedure: CORONARY STENT INTERVENTION;  Surgeon: [name], MD;  Location: [place] [facility];  Service:  Cardiovascular;  Laterality: N/A;  . KNEE ARTHROSCOPY    . LEFT HEART CATH AND CORONARY ANGIOGRAPHY N/A 09/14/2019   Procedure: LEFT HEART CATH AND CORONARY ANGIOGRAPHY;  Surgeon: [name], MD;  Location: [place] [facility];  Service: Cardiovascular;  Laterality: N/A;  . LEFT HEART CATH AND CORS/GRAFTS ANGIOGRAPHY N/A 11/22/2019   Procedure: LEFT HEART CATH AND CORS/GRAFTS ANGIOGRAPHY;  Surgeon: [name], MD;   Location: [place] [facility];  Service: Cardiovascular;  Laterality: N/A;  . OSTEOCHONDRAL DEFECT REPAIR/RECONSTRUCTION Left 11/29/2014   Procedure: LEFT ANKLE MEDIAL MALLEOLUS OSTEOTOMY,AUTO GRAFT FROM CALCANEOUS;  OS TIBIA DENORO GRAFTING TALUS;  Surgeon: Wylene Simmer, MD;  Location: [place];  Service: Orthopedics;  Laterality: Left;  . TEE WITHOUT CARDIOVERSION N/A 09/15/2019   Procedure: TRANSESOPHAGEAL ECHOCARDIOGRAM (TEE);  Surgeon: Gaye Pollack, MD;  Location: [place];  Service: Open Heart Surgery;  Laterality: N/A;  . TUBAL LIGATION       Medications Prior to Admission: Prior to Admission medications   Medication Sig Start Date End Date Taking? Authorizing Provider  acetaminophen (TYLENOL) 500 MG tablet Take 500-1,000 mg by mouth every 6 (six) hours as needed (for pain.).    [provider]  amLODipine (NORVASC) 2.5 MG tablet TAKE 1 TABLET BY MOUTH DAILY. 02/19/20   Revankar, Reita Cliche, MD  aspirin EC 81 MG tablet Take 1 tablet (81 mg total) by mouth daily. 09/05/19   Revankar, Reita Cliche, MD  buPROPion (WELLBUTRIN XL) 300 MG 24 hr tablet Take 1 tablet (300 mg total) by mouth every morning. 04/26/17   Geradine Girt, DO  clopidogrel (PLAVIX) 75 MG tablet TAKE 1 TABLET BY MOUTH DAILY 11/21/19   Revankar, Reita Cliche, MD  ferrous sulfate 325 (65 FE) MG tablet Take 1 tablet (325 mg total) by mouth daily with breakfast. 11/25/19   Cheryln Manly, NP  isosorbide mononitrate (IMDUR) 60 MG 24 hr tablet Take 1 tablet (60 mg total) by mouth in the morning and at bedtime. 12/07/19 03/06/20  Revankar, Reita Cliche, MD  levothyroxine (SYNTHROID) 75 MCG tablet Take 75 mcg by mouth daily before breakfast.    [provider]  metoprolol tartrate (LOPRESSOR) 25 MG tablet TAKE 1 AND 1/2 TABLETS BY MOUTH 2 TIMES DAILY. 02/19/20   Revankar, Reita Cliche, MD  ondansetron (ZOFRAN) 4 MG tablet Take 1 tablet (4 mg total) by mouth every 8 (eight) hours as needed for nausea or vomiting. 09/19/19    Elgie Collard, PA-C  ranolazine (RANEXA) 500 MG 12 hr tablet TAKE 1 TABLET BY MOUTH 2 TIMES DAILY. 02/02/20   Revankar, Reita Cliche, MD  rOPINIRole (REQUIP) 1 MG tablet Take 1 mg by mouth 3 (three) times daily.     [provider]  rOPINIRole (REQUIP) 4 MG tablet Take 4 mg by mouth at bedtime.    [provider]  rosuvastatin (CRESTOR) 20 MG tablet TAKE 1 TABLET BY MOUTH DAILY AT [facility] 12/11/19   Revankar, Reita Cliche, MD  topiramate (TOPAMAX) 100 MG tablet Take 100 mg by mouth 2 (two) times daily.    [provider]  traMADol (ULTRAM) 50 MG tablet Take 1 tablet (50 mg total) by mouth every 12 (twelve) hours as needed (pain). 10/31/19   Gold, Wilder Glade, PA-C  traZODone (DESYREL) 150 MG tablet Take 150 mg by mouth at bedtime.  05/22/19   [provider]     Allergies:    Allergies  Allergen Reactions  . Carbidopa-Levodopa Other (See  Comments)    Developed tics while taking   . Prednisone Other (See Comments)    Turns red all over   . Venlafaxine Other (See Comments)    Developed tics while taking     Social History:   Social History   Socioeconomic History  . Marital status: Married    Spouse name: Not on file  . Number of children: Not on file  . Years of education: Not on file  . Highest education level: Not on file  Occupational History  . Not on file  Tobacco Use  . Smoking status: Former Research scientist (life sciences)  . Smokeless tobacco: Never Used  Vaping Use  . Vaping Use: Every day  . Substances: Nicotine  Substance and Sexual Activity  . Alcohol use: No  . Drug use: No  . Sexual activity: Not on file    Comment: e-cig  Other Topics Concern  . Not on file  Social History Narrative  . Not on file   Social Determinants of Health   Financial Resource Strain:   . Difficulty of Paying Living Expenses:   Food Insecurity:   . Worried About Charity fundraiser in the Last Year:   . Arboriculturist in the Last Year:   Transportation Needs:   . Lexicographer (Medical):   Marland Kitchen Lack of Transportation (Non-Medical):   Physical Activity:   . Days of Exercise per Week:   . Minutes of Exercise per Session:   Stress:   . Feeling of Stress :   Social Connections:   . Frequency of Communication with Friends and Family:   . Frequency of Social Gatherings with Friends and Family:   . Attends Religious Services:   . Active Member of Clubs or Organizations:   . Attends Archivist Meetings:   Marland Kitchen Marital Status:   Intimate Partner Violence:   . Fear of Current or Ex-Partner:   . Emotionally Abused:   Marland Kitchen Physically Abused:   . Sexually Abused:     Family History:   The patient's family history includes Asthma in her mother; COPD in her mother; Congestive Heart Failure in her father; Diabetes in her mother; Macular degeneration in her mother.    ROS:  Please see the history of present illness.  All other ROS reviewed and negative.     Physical Exam/Data:   Vitals:   03/12/20 1700 03/12/20 1800 03/12/20 1838  BP: (!) 146/70    Pulse: 70 67 66  Temp: 97.7 F (36.5 C)    TempSrc: Oral    SpO2: 97% 98% 100%  Weight: 68.2 kg    Height: 5\' 3"  (1.6 m)      Intake/Output Summary (Last 24 hours) at 03/12/2020 1928 Last data filed at 03/12/2020 1838 Gross per 24 hour  Intake 222 ml  Output --  Net 222 ml   Last 3 Weights 03/12/2020 01/09/2020 12/07/2019  Weight (lbs) 150 lb 6.4 oz 147 lb 146 lb 12 oz  Weight (kg) 68.221 kg 66.679 kg 66.565 kg  Some encounter information is confidential and restricted. Go to Review Flowsheets activity to see all data.     Body mass index is 26.64 kg/m.  General:  Well nourished, well developed, in no acute distress HEENT: normal Lymph: no adenopathy Neck: no  JVD Endocrine:  No thryomegaly Vascular: No carotid bruits; FA pulses 2+ bilaterally without bruits  Cardiac:  normal S1, S2; RRR; no murmur, sharp pain while palpation at substernal surgical area  Lungs:  clear to auscultation  bilaterally, no wheezing, rhonchi or rales  Abd: soft, nontender, no hepatomegaly  Ext: no edema Musculoskeletal:  No deformities, BUE and BLE strength normal and equal Skin: warm and dry  Neuro:  CNs 2-12 intact, no focal abnormalities noted Psych:  Normal affect    EKG:  The ECG that was done today at outside hospital was personally reviewed and demonstrates normal sinus rhythm at rate of 63 bpm  Relevant CV Studies:   CORONARY STENT INTERVENTION  Conclusion    Ost LAD to Prox LAD lesion is 70% stenosed.  Prox LAD to Mid LAD lesion is 50% stenosed with 50% stenosed side branch in 1st Diag.  Mid Cx lesion is 99% stenosed.  2nd Mrg-2 lesion is 95% stenosed with 0% stenosed side branch in Lat 2nd Mrg.  2nd Mrg-1 lesion is 95% stenosed.  RPAV lesion is 95% stenosed.  Prox RCA to Mid RCA lesion is 80% stenosed.  Dist RCA lesion is 80% stenosed with 70% stenosed side branch in RPAV.  Origin to Prox Graft lesion is 80% stenosed.  Prox Graft to Mid Graft lesion is 70% stenosed.  A stent was successfully placed.  Post intervention, there is a 0% residual stenosis.  Post intervention, the side branch was reduced to 0% residual stenosis.  Post intervention, there is a 0% residual stenosis.  Post intervention, there is a 0% residual stenosis.  A stent was successfully placed.  A stent was successfully placed.   Difficult but ultimately successful percutaneous coronary intervention to the native dominant RCA in a patient with documented diffuse disease in the vein graft supplying her PDA vessel.    A total of 3 stents were inserted with a 2.0 x 30 mm Resolute Onyx DES stent most distal with tandem overlap with a 2.0 x 12 mm stent to cover the 80 and 95% distal stenoses before the PDA and PLA vessels and of a 2.5 x 30 mm Resolute stent to cover the 80% stenosis in the mid vessel followed by an additional area of narrowing of 70% just proximal to the acute margin.  Prior  to stenting, there was suggestion of distal RCA dissection following very low level inflation with a 2.0 x 12 balloon at the distal 95% stenosis which was successfully treated with stent insertion.  At the completion of the procedure, all areas were reduced to 0%.  There was brisk TIMI-3 flow.  There was no evidence for dissection.  Competitive filling was present down the PDA due to the SVG supplying the mid PDA region.  RECOMMENDATION: Long-term DAPT indefinitely.  Hydration post procedure in this patient with baseline renal insufficiency.  S of lipid-lowering therapy with target LDL less than 70.  Concomitant medical therapy with nitrates, and a blocker, as well as ranolazine which was added yesterday for the patient's distal circumflex disease.  Patient will follow up with Dr. Geraldo Pitter. Diagnostic Dominance: Right  Intervention      Laboratory Data:  Reviewed outside hospital labs personally  Radiology/Studies:  No results found.  HEAR Score (for undifferentiated chest pain):  HEAR Score: 3    Assessment and Plan:   1. Unstable angina -Patient on aspirin 81 mg and Plavix 75 mg daily for antiplatelet therapy.  Antianginal therapy includes amlodipine 2.5 MG daily, Imdur 60 mg twice daily, metoprolol tartrate 37.5 mg twice daily and Ranexa 500 mg twice daily.  Patient reports compliance with her medication. -Patient has intermittent anginal episode requiring sublingual nitroglycerin approximately 2  times per week with resolution of her symptoms.  She needs to stop multiple times while push moving lawn. -Today her symptoms improved with sublingual nitroglycerin x1 but did not resolve. -EKG without acute ischemic changes. -Troponin negative. -Consideration includes uptitrating and anginal therapy versus relook coronary anatomy. - Continue home meds except Imdur and start nitro gtt. Recheck troponin, if positive enzyme>> start heparin. Likely cath tomorrow.   2.  CAD -  s/p CABG x 31 August 2019 with early graft failure of SVG to PDA requiring stenting to native RCA January 2021.  Recommended medical therapy for distal circumflex disease.  - Cath report as above  3. HTN - BP of 146/70 - Resume home medications  4. HLD - 09/18/2019: VLDL 24 11/27/2019: Cholesterol, Total 145; HDL 67; LDL Chol Calc (NIH) 61; Triglycerides 90  - Continue statin   5. CKD III - Scr of 1.5 at outside hospital  - Baseline Scr of 1.4-1.5  6. ? DM - Not on any meds - Check HgbA1c  Severity of Illness: The appropriate patient status for this patient is OBSERVATION. Observation status is judged to be reasonable and necessary in order to provide the required intensity of service to ensure the patient's safety. The patient's presenting symptoms, physical exam findings, and initial radiographic and laboratory data in the context of their medical condition is felt to place them at decreased risk for further clinical deterioration. Furthermore, it is anticipated that the patient will be medically stable for discharge from the hospital within 2 midnights of admission. The following factors support the patient status of observation.   " The patient's presenting symptoms include Chest pain . " The physical exam findings include None " The initial radiographic and laboratory data are None     For questions or updates, please contact [facility] Please consult [email or web address] for contact info under     Signed, Candee Furbish, MD  03/12/2020 7:28 PM   Personally seen and examined. Agree with above.   Here with worsening chest discomfort. Transfer. Works at [employer] as a Chartered certified accountant.  Currently having moderate chest discomfort.  GEN: Well nourished, well developed, in no acute distress  HEENT: normal  Neck: no JVD, carotid bruits, or masses Cardiac: RRR; no murmurs, rubs, or gallops,no edema  Respiratory:  clear to auscultation bilaterally, normal work of breathing GI: soft,  nontender, nondistended, + BS MS: no deformity or atrophy  Skin: warm and dry, no rash Neuro:  Alert and Oriented x 3, Strength and sensation are intact Psych: euthymic mood, full affect  Initial troponin negative. EKG without any new ischemic changes.  Prior cardiac catheterization reviewed see above for details.  Assessment and plan:  Unstable angina -We will go ahead and start nitroglycerin drip IV given her ongoing chest pain. -We will plan for cardiac catheterization tomorrow, n.p.o. past midnight. Risks of benefits including stroke heart attack death renal impairment bleeding have been discussed. She is willing to proceed. -Obviously she is frustrated with the way she feels. She states that she is under immense stress. Her husband has multiple sclerosis and cannot do anything for himself and is at home. She has to take care of both he and herself. Feels tired all of the time she states. When asked about cardiac rehabilitation, she states that she cannot afford this as it is a $150 co-pay each session at [facility]. She does state that she is compliant with her medications and is taking isosorbide, Ranexa, amlodipine, metoprolol as antianginals.  Hypertension and hyperlipidemia -Recent LDL 61. Continue with goal-directed therapy, high intensity statin.  Chronic kidney disease stage IIIa -Serum creatinine 1.5-1.4. Precath hydration.  Candee Furbish, MD

## 2020-03-13 ENCOUNTER — Telehealth: Payer: Self-pay | Admitting: Cardiology

## 2020-03-13 ENCOUNTER — Encounter (HOSPITAL_COMMUNITY)
Admission: AD | Disposition: A | Discharge status: Home / Self Care | Payer: Self-pay | Source: Other Acute Inpatient Hospital | Attending: Cardiology

## 2020-03-13 ENCOUNTER — Encounter (HOSPITAL_COMMUNITY): Payer: Self-pay | Admitting: Cardiovascular Disease

## 2020-03-13 ENCOUNTER — Other Ambulatory Visit: Payer: Self-pay

## 2020-03-13 ENCOUNTER — Other Ambulatory Visit (HOSPITAL_COMMUNITY): Payer: Self-pay | Admitting: Medical

## 2020-03-13 DIAGNOSIS — I129 Hypertensive chronic kidney disease with stage 1 through stage 4 chronic kidney disease, or unspecified chronic kidney disease: Secondary | ICD-10-CM | POA: Diagnosis not present

## 2020-03-13 DIAGNOSIS — Z951 Presence of aortocoronary bypass graft: Secondary | ICD-10-CM | POA: Diagnosis not present

## 2020-03-13 DIAGNOSIS — I25710 Atherosclerosis of autologous vein coronary artery bypass graft(s) with unstable angina pectoris: Secondary | ICD-10-CM | POA: Diagnosis not present

## 2020-03-13 DIAGNOSIS — I25110 Atherosclerotic heart disease of native coronary artery with unstable angina pectoris: Principal | ICD-10-CM

## 2020-03-13 DIAGNOSIS — I200 Unstable angina: Secondary | ICD-10-CM | POA: Diagnosis not present

## 2020-03-13 DIAGNOSIS — Z20822 Contact with and (suspected) exposure to covid-19: Secondary | ICD-10-CM | POA: Diagnosis not present

## 2020-03-13 HISTORY — PX: LEFT HEART CATH AND CORS/GRAFTS ANGIOGRAPHY: CATH118250

## 2020-03-13 LAB — BASIC METABOLIC PANEL
Anion gap: 9 (ref 5–15)
BUN: 19 mg/dL (ref 6–20)
CO2: 21 mmol/L — ABNORMAL LOW (ref 22–32)
Calcium: 8.3 mg/dL — ABNORMAL LOW (ref 8.9–10.3)
Chloride: 111 mmol/L (ref 98–111)
Creatinine, Ser: 1.66 mg/dL — ABNORMAL HIGH (ref 0.44–1.00)
GFR calc Af Amer: 38 mL/min — ABNORMAL LOW (ref >60)
GFR calc non Af Amer: 33 mL/min — ABNORMAL LOW (ref >60)
Glucose, Bld: 130 mg/dL — ABNORMAL HIGH (ref 70–99)
Potassium: 3.7 mmol/L (ref 3.5–5.1)
Sodium: 141 mmol/L (ref 135–145)

## 2020-03-13 LAB — CBC
HCT: 33.7 % — ABNORMAL LOW (ref 36.0–46.0)
Hemoglobin: 11.2 g/dL — ABNORMAL LOW (ref 12.0–15.0)
MCH: 30.1 pg (ref 26.0–34.0)
MCHC: 33.2 g/dL (ref 30.0–36.0)
MCV: 90.6 fL (ref 80.0–100.0)
Platelets: 221 10*3/uL (ref 150–400)
RBC: 3.72 MIL/uL — ABNORMAL LOW (ref 3.87–5.11)
RDW: 12.9 % (ref 11.5–15.5)
WBC: 5.4 10*3/uL (ref 4.0–10.5)
nRBC: 0 % (ref 0.0–0.2)

## 2020-03-13 SURGERY — LEFT HEART CATH AND CORS/GRAFTS ANGIOGRAPHY
Anesthesia: LOCAL

## 2020-03-13 MED ORDER — ONDANSETRON HCL 4 MG/2ML IJ SOLN: 4.0000 mg | Freq: Four times a day (QID) | INTRAMUSCULAR | Status: DC | PRN

## 2020-03-13 MED ORDER — FENTANYL CITRATE (PF) 100 MCG/2ML IJ SOLN
INTRAMUSCULAR | Status: DC | PRN
  Administered 2020-03-13: 25 ug via INTRAVENOUS

## 2020-03-13 MED ORDER — HYDRALAZINE HCL 20 MG/ML IJ SOLN: 10.0000 mg | INTRAMUSCULAR | Status: DC | PRN

## 2020-03-13 MED ORDER — MIDAZOLAM HCL 2 MG/2ML IJ SOLN
INTRAMUSCULAR | Status: DC | PRN
  Administered 2020-03-13: 1 mg via INTRAVENOUS

## 2020-03-13 MED ORDER — LABETALOL HCL 5 MG/ML IV SOLN: 10.0000 mg | INTRAVENOUS | Status: DC | PRN

## 2020-03-13 MED ORDER — FENTANYL CITRATE (PF) 100 MCG/2ML IJ SOLN
INTRAMUSCULAR | Status: AC
  Filled 2020-03-13: qty 2

## 2020-03-13 MED ORDER — SODIUM CHLORIDE 0.9 % IV SOLN: INTRAVENOUS | Status: AC

## 2020-03-13 MED ORDER — CLOPIDOGREL BISULFATE 75 MG PO TABS: 75.0000 mg | ORAL_TABLET | Freq: Every day | ORAL | Status: DC

## 2020-03-13 MED ORDER — ATORVASTATIN CALCIUM 80 MG PO TABS: 80.0000 mg | ORAL_TABLET | Freq: Every day | ORAL | Status: DC

## 2020-03-13 MED ORDER — MIDAZOLAM HCL 2 MG/2ML IJ SOLN
INTRAMUSCULAR | Status: AC
  Filled 2020-03-13: qty 2

## 2020-03-13 MED ORDER — SODIUM CHLORIDE 0.9% FLUSH: 3.0000 mL | Freq: Two times a day (BID) | INTRAVENOUS | Status: DC

## 2020-03-13 MED ORDER — ISOSORBIDE MONONITRATE ER 60 MG PO TB24
60.0000 mg | ORAL_TABLET | Freq: Two times a day (BID) | ORAL | Status: DC
  Administered 2020-03-13: 60 mg via ORAL
  Filled 2020-03-13: qty 1

## 2020-03-13 MED ORDER — IOHEXOL 350 MG/ML SOLN
INTRAVENOUS | Status: AC
  Filled 2020-03-13: qty 1

## 2020-03-13 MED ORDER — HEPARIN (PORCINE) IN NACL 1000-0.9 UT/500ML-% IV SOLN
INTRAVENOUS | Status: DC | PRN
  Administered 2020-03-13: 500 mL

## 2020-03-13 MED ORDER — SODIUM CHLORIDE 0.9 % IV SOLN: 250.0000 mL | INTRAVENOUS | Status: DC | PRN

## 2020-03-13 MED ORDER — HEPARIN (PORCINE) IN NACL 1000-0.9 UT/500ML-% IV SOLN
INTRAVENOUS | Status: AC
  Filled 2020-03-13: qty 1000

## 2020-03-13 MED ORDER — ASPIRIN 81 MG PO CHEW: 81.0000 mg | CHEWABLE_TABLET | Freq: Every day | ORAL | Status: DC

## 2020-03-13 MED ORDER — IOHEXOL 350 MG/ML SOLN
INTRAVENOUS | Status: DC | PRN
  Administered 2020-03-13: 55 mL

## 2020-03-13 MED ORDER — LIDOCAINE HCL (PF) 1 % IJ SOLN
INTRAMUSCULAR | Status: DC | PRN
  Administered 2020-03-13: 20 mL

## 2020-03-13 MED ORDER — LIDOCAINE HCL (PF) 1 % IJ SOLN
INTRAMUSCULAR | Status: AC
  Filled 2020-03-13: qty 30

## 2020-03-13 MED ORDER — SODIUM CHLORIDE 0.9% FLUSH: 3.0000 mL | INTRAVENOUS | Status: DC | PRN

## 2020-03-13 MED ORDER — MORPHINE SULFATE (PF) 2 MG/ML IV SOLN: 2.0000 mg | INTRAVENOUS | Status: DC | PRN

## 2020-03-13 MED ORDER — RANOLAZINE ER 500 MG PO TB12: 1000.0000 mg | ORAL_TABLET | Freq: Two times a day (BID) | ORAL | Status: DC

## 2020-03-13 MED ORDER — SODIUM CHLORIDE 0.9 % IV BOLUS
200.0000 mL | Freq: Once | INTRAVENOUS | Status: AC
  Administered 2020-03-13: 200 mL via INTRAVENOUS

## 2020-03-13 MED ORDER — ACETAMINOPHEN 325 MG PO TABS: 650.0000 mg | ORAL_TABLET | ORAL | Status: DC | PRN

## 2020-03-13 MED ORDER — RANOLAZINE ER 1000 MG PO TB12: 1000.0000 mg | ORAL_TABLET | Freq: Two times a day (BID) | ORAL | 3 refills | Status: DC

## 2020-03-13 MED ORDER — ROSUVASTATIN CALCIUM 40 MG PO TABS: 40.0000 mg | ORAL_TABLET | Freq: Every day | ORAL | 3 refills | Status: DC

## 2020-03-13 SURGICAL SUPPLY — 7 items
CATH INFINITI 5FR MULTPACK ANG (CATHETERS) ×2 IMPLANT
CLOSURE MYNX CONTROL 5F (Vascular Products) ×2 IMPLANT
KIT HEART LEFT (KITS) ×2 IMPLANT
PACK CARDIAC CATHETERIZATION (CUSTOM PROCEDURE TRAY) ×2 IMPLANT
SHEATH PINNACLE 5F 10CM (SHEATH) ×2 IMPLANT
TRANSDUCER W/STOPCOCK (MISCELLANEOUS) ×2 IMPLANT
WIRE EMERALD 3MM-J .035X150CM (WIRE) ×2 IMPLANT

## 2020-03-13 NOTE — Discharge Summary (Addendum)
Discharge Summary    Patient ID: Meghan Terry MRN: [id number]; DOB: [birth year]  Admit date: 03/12/2020 Discharge date: 03/13/2020  Primary Care Provider: Garwin Brothers, MD  Primary Cardiologist: Jenean Lindau, MD  Primary Electrophysiologist:  None   Discharge Diagnoses    Principal Problem:   Unstable angina [facility]) Active Problems:   Essential hypertension   S/P CABG x 3   Coronary artery disease involving native coronary artery of native heart with angina pectoris ([place])   Hyperlipidemia with target LDL less than 70   CKD (chronic kidney disease) stage 3b, GFR 30-59 ml/min    Diagnostic Studies/Procedures    Left heart catheterization 03/13/20:  Ost RCA to Prox RCA lesion is 30% stenosed.  Previously placed Prox RCA to Dist RCA stent (unknown type) is widely patent.  Previously placed RPAV stent (unknown type) is widely patent.  RPDA lesion is 100% stenosed.  Ost Cx to Prox Cx lesion is 100% stenosed.  3rd Mrg lesion is 100% stenosed.  Mid Cx to Dist Cx lesion is 100% stenosed.  Origin to Prox Graft lesion is 75% stenosed.  Mid LAD-1 lesion is 75% stenosed.  Mid LAD-2 lesion is 95% stenosed.   IMPRESSION: Meghan Terry has a patent vein to the PDA which was subtotally occluded at the time of her last cath 4 months ago.  I suspect this may been related to spasm.  The previously placed stents by Dr. Claiborne Billings in the proximal mid and distal RCA and PLA branches are completely patent.  The vein to the circumflex obtuse marginal branch has a moderately high-grade segmental ostial/proximal stenosis although the small circumflex marginal branch distal to the graft insertion has occluded since the last cath.  The graft only supplies a small posterior lateral distal branch with retrograde filling.  This only supplies a small amount of myocardium.  The LIMA is intact.  I reviewed the films with Dr. Claiborne Billings we both agree that medical therapy is the best option.  I have communicated this to  Dr. Marlou Porch as well.  A right common femoral angiogram was performed and a Mynx closure device was successfully deployed achieving hemostasis.  The patient left the lab in stable condition.   _____________   History of Present Illness     Meghan Terry is a 59 y.o. female with CAD s/p CABG December 2020 (LIMA to LAD, SVG to distal OM and SVG to PDA) with early graft failure (SVG to PDA) January 2021 s/p PCI to native RCA, hypertension, hyperlipidemia, CKD stage III, and prior smoker transfer hospital for chest pain evaluation.  Hx of CAD s/p CABG x 31 August 2019 with early graft failure of SVG to PDA requiring stenting to native RCA January 2021.  Recommended medical therapy for distal circumflex disease.   Last seen by Dr. Salley Scarlet 01/09/2020.   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  While at work,  patient had substernal chest pressure.  Radiating to her back and associated with nausea.  She took 1 sublingual nitroglycerin with minimal improvement of her pain.  This is noted by PA at curbside and advised to be evaluated in ER.  EKG without acute ischemic changes.  Troponin negative.  Hemoglobin 12.  Serum creatinine 1.5.  Potassium 4.1.  Normal liver function.  At [facility] patient reports 4 out of 10 chest pressure with intermittent sharp sensation.  Patient reports symptoms similar to prior angina when she had PCI and CABG.  Denies regular exercise but  active at work.  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  She requires to stop multiple times.  Usually, she takes 2 sublingual nitroglycerin per week with resolution of her symptoms however her symptoms did not go away today after 1 sublingual nitroglycerin.  She reports compliance with her medication.  Smokes vapor occasionally.  No illicit drug use or alcohol abuse.  Hospital Course     Consultants: None   1. Unstable angina: patient presented with CP c/w angina prior to CABG and PCI. EKG was  non-ischemic and troponin's remained negative. Decision made to pursue a LHC which showed patent stent in SVG to PDA, patent mid and distal RCA and PLA stents were patent, SVG to OM branch showed moderately high-grade stenosis with small OM branch distal to the graft showing a new occlusion since last cath which was too small for PCI, and patent LIMA. She was recommended for medical management. Her ranexa was increased to 1000mg  BID. Cath site was stable upon discharge.  - Continue aspirin, plavix, and statin - Continue imdur, ranexa, amlodipine, and metoprolol.   2. HTN: BP stable this admission - Continue amlodipine, metoprolol, and imdur  3. HLD: LDL 61 11/2019; at goal of <70. Rosuvastatin increased to 40mg  daily. - Continue rosuvastatin  4. CKD stage 3: Cr 1.6 this admission - Check BMET at follow-up for close monitoring   Did the patient have an acute coronary syndrome (MI, NSTEMI, STEMI, etc) this admission?:  No                               Did the patient have a percutaneous coronary intervention (stent / angioplasty)?:  No.   _____________  Discharge Vitals Blood pressure (!) 114/55, pulse 75, temperature 97.9 F (36.6 C), temperature source Oral, resp. rate 14, height 5\' 3"  (1.6 m), weight 67.9 kg, SpO2 100 %.  Filed Weights   03/12/20 1700 03/13/20 0455  Weight: 68.2 kg 67.9 kg    Labs & Radiologic Studies    CBC Recent Labs    03/12/20 2125 03/13/20 0405  WBC 6.0 5.4  HGB 11.6* 11.2*  HCT 35.2* 33.7*  MCV 90.7 90.6  PLT 230 818   Basic Metabolic Panel Recent Labs    03/12/20 2125 03/13/20 0405  NA  --  141  K  --  3.7  CL  --  111  CO2  --  21*  GLUCOSE  --  130*  BUN  --  19  CREATININE 1.68* 1.66*  CALCIUM  --  8.3*   Liver Function Tests No results for input(s): AST, ALT, ALKPHOS, BILITOT, PROT, ALBUMIN in the last 72 hours. No results for input(s): LIPASE, AMYLASE in the last 72 hours. High Sensitivity Troponin:   Recent Labs  Lab  03/12/20 2125  TROPONINIHS 3    BNP Invalid input(s): POCBNP D-Dimer No results for input(s): DDIMER in the last 72 hours. Hemoglobin A1C No results for input(s): HGBA1C in the last 72 hours. Fasting Lipid Panel No results for input(s): CHOL, HDL, LDLCALC, TRIG, CHOLHDL, LDLDIRECT in the last 72 hours. Thyroid Function Tests Recent Labs    03/12/20 2125  TSH 0.932   _____________  CARDIAC CATHETERIZATION  Result Date: 03/13/2020  Ost RCA to Prox RCA lesion is 30% stenosed.  Previously placed Prox RCA to Dist RCA stent (unknown type) is widely patent.  Previously placed RPAV stent (unknown type) is widely patent.  RPDA lesion  is 100% stenosed.  Ost Cx to Prox Cx lesion is 100% stenosed.  3rd Mrg lesion is 100% stenosed.  Mid Cx to Dist Cx lesion is 100% stenosed.  Origin to Prox Graft lesion is 75% stenosed.  Mid LAD-1 lesion is 75% stenosed.  Mid LAD-2 lesion is 95% stenosed.  Meghan Terry is a 59 y.o. female  [id number] LOCATION:  FACILITY: [place] PHYSICIAN: Quay Burow, M.D. [birth year] DATE OF PROCEDURE:  03/13/2020 DATE OF DISCHARGE: CARDIAC CATHETERIZATION History obtained from chart review.Meghan Terry is a 59 y.o. female with CAD s/p CABG December 2020 (LIMA to LAD, SVG to distal OM and SVG to PDA) with early graft failure (SVG to PDA) January 2021 s/p PCI to native RCA, hypertension, hyperlipidemia, CKD stage III, and prior smoker transfer hospital for chest pain evaluation.  Hx of CAD s/p CABG x 31 August 2019 with early graft failure of SVG to PDA requiring stenting to native RCA January 2021.  Recommended medical therapy for distal circumflex disease.   Meghan Terry has a patent vein to the PDA which was subtotally occluded at the time of her last cath 4 months ago.  I suspect this may been related to spasm.  The previously placed stents by Dr. Claiborne Billings in the proximal mid and distal RCA and PLA branches are completely patent.  The vein to the circumflex obtuse marginal branch has  a moderately high-grade segmental ostial/proximal stenosis although the small circumflex marginal branch distal to the graft insertion has occluded since the last cath.  The graft only supplies a small posterior lateral distal branch with retrograde filling.  This only supplies a small amount of myocardium.  The LIMA is intact.  I reviewed the films with Dr. Claiborne Billings we both agree that medical therapy is the best option.  I have communicated this to Dr. Marlou Porch as well.  A right common femoral angiogram was performed and a Mynx closure device was successfully deployed achieving hemostasis.  The patient left the lab in stable condition. Quay Burow. MD, [facility] 03/13/2020 10:02 AM   Disposition   Pt is being discharged home today in good condition.  Follow-up Plans & Appointments     Follow-up Information    Revankar, Reita Cliche, MD Follow up on 03/22/2020.   Specialty: Cardiology Why: Please arrive 15 minutes early for your 8:25am post-hospital cardiology follow-up appointment.  Contact information: [place] [address] [phone]                Discharge Medications   Allergies as of 03/13/2020      Reactions   Carbidopa-levodopa Other (See Comments)   Developed tics while taking   Prednisone Other (See Comments)   Turns red all over   Venlafaxine Other (See Comments)   Developed tics while taking      Medication List    TAKE these medications   acetaminophen 500 MG tablet Commonly known as: TYLENOL Take 500-1,000 mg by mouth every 6 (six) hours as needed (for pain.).   amLODipine 2.5 MG tablet Commonly known as: NORVASC TAKE 1 TABLET BY MOUTH DAILY.   aspirin EC 81 MG tablet Take 1 tablet (81 mg total) by mouth daily.   buPROPion 300 MG 24 hr tablet Commonly known as: WELLBUTRIN XL Take 1 tablet (300 mg total) by mouth every morning.   clopidogrel 75 MG tablet Commonly known as: PLAVIX TAKE 1 TABLET BY MOUTH DAILY What changed:  when to take this  ferrous sulfate 325 (65 FE) MG tablet Take 1 tablet (325 mg total) by mouth daily with breakfast.   isosorbide mononitrate 60 MG 24 hr tablet Commonly known as: IMDUR Take 1 tablet (60 mg total) by mouth in the morning and at bedtime.   levothyroxine 75 MCG tablet Commonly known as: SYNTHROID Take 75 mcg by mouth daily before breakfast.   metoprolol tartrate 25 MG tablet Commonly known as: LOPRESSOR TAKE 1 AND 1/2 TABLETS BY MOUTH 2 TIMES DAILY. What changed: See the new instructions.   nitroGLYCERIN 0.4 MG SL tablet Commonly known as: NITROSTAT Place 0.4 mg under the tongue See admin instructions. Place 0.4 mg sublingually four to five times a day as needed for chest pain   ondansetron 4 MG tablet Commonly known as: Zofran Take 1 tablet (4 mg total) by mouth every 8 (eight) hours as needed for nausea or vomiting.   ranolazine 1000 MG SR tablet Commonly known as: RANEXA Take 1 tablet (1,000 mg total) by mouth 2 (two) times daily. What changed:   medication strength  how much to take   rOPINIRole 1 MG tablet Commonly known as: REQUIP Take 1 mg by mouth 3 (three) times daily.   rOPINIRole 4 MG tablet Commonly known as: REQUIP Take 4 mg by mouth at bedtime.   rosuvastatin 40 MG tablet Commonly known as: CRESTOR Take 1 tablet (40 mg total) by mouth at bedtime. What changed:   medication strength  See the new instructions.   topiramate 100 MG tablet Commonly known as: TOPAMAX Take 100 mg by mouth 2 (two) times daily.   traMADol 50 MG tablet Commonly known as: ULTRAM Take 1 tablet (50 mg total) by mouth every 12 (twelve) hours as needed (pain).   traZODone 150 MG tablet Commonly known as: DESYREL Take 150 mg by mouth at bedtime.          Outstanding Labs/Studies   Check BMET at follow-up.   Duration of Discharge Encounter   Greater than 30 minutes including physician time.  Signed, Abigail Butts, PA-C 03/13/2020, 12:01  PM  Personally seen and examined. Agree with above.  Trop normal No MI Chest pain -angina Cath personally reviewed - OM territory not amenable to PCI. Discussed with Dr. Gwenlyn Found Increased Ranexa to 1000 BID Promote exercise.  OK for DC  Candee Furbish, MD

## 2020-03-13 NOTE — Telephone Encounter (Signed)
TOC on 03/22/20 at 8:25am with Dr. Geraldo Pitter.

## 2020-03-13 NOTE — Interval H&P Note (Signed)
History and Physical Interval Note:  6/16/2021Cath Lab Visit (complete for each Cath Lab visit)  Clinical Evaluation Leading to the Procedure:   ACS: Yes.    Non-ACS:    Anginal Classification: CCS III  Anti-ischemic medical therapy: Minimal Therapy (1 class of medications)  Non-Invasive Test Results: No non-invasive testing performed  Prior CABG: Previous CABG        [name]  has presented today for surgery, with the diagnosis of unstable angina.  The various methods of treatment have been discussed with the patient and family. After consideration of risks, benefits and other options for treatment, the patient has consented to  Procedure(s): LEFT HEART CATH AND CORS/GRAFTS ANGIOGRAPHY (N/A) as a surgical intervention.  The patient's history has been reviewed, patient examined, no change in status, stable for surgery.  I have reviewed the patient's chart and labs.  Questions were answered to the patient's satisfaction.     Quay Burow

## 2020-03-13 NOTE — Progress Notes (Addendum)
   Cardiac catheterization reviewed with Dr. Gwenlyn Found.  -Distal obtuse marginal territory/circumflex territory is now down.  Too small for PCI.  -We will increase her antianginal support with Ranexa 1000 twice a day.  Continue isosorbide.  Stopping nitroglycerin drip.  -- If she is feeling better after lunch, OK with DC home. Her son's birthday (37) is tomorrow.   Candee Furbish, MD

## 2020-03-13 NOTE — Discharge Instructions (Signed)

## 2020-03-14 LAB — HEMOGLOBIN A1C
Hgb A1c MFr Bld: 6.1 % — ABNORMAL HIGH (ref 4.8–5.6)
Mean Plasma Glucose: 128 mg/dL

## 2020-03-14 MED FILL — Ondansetron HCl Inj 4 MG/2ML (2 MG/ML): INTRAMUSCULAR | Qty: 2 | Status: AC

## 2020-03-14 MED FILL — Morphine Sulfate Inj 4 MG/ML: INTRAMUSCULAR | Qty: 1 | Status: AC

## 2020-03-22 ENCOUNTER — Encounter: Payer: Self-pay | Admitting: Cardiology

## 2020-03-22 ENCOUNTER — Ambulatory Visit: Payer: Commercial Managed Care - PPO | Admitting: Cardiology

## 2020-03-22 ENCOUNTER — Other Ambulatory Visit: Payer: Self-pay

## 2020-03-22 VITALS — BP 130/70 | HR 82 | Ht 63.0 in | Wt 151.0 lb

## 2020-03-22 DIAGNOSIS — Z955 Presence of coronary angioplasty implant and graft: Secondary | ICD-10-CM

## 2020-03-22 DIAGNOSIS — I10 Essential (primary) hypertension: Secondary | ICD-10-CM

## 2020-03-22 DIAGNOSIS — E785 Hyperlipidemia, unspecified: Secondary | ICD-10-CM | POA: Diagnosis not present

## 2020-03-22 DIAGNOSIS — I209 Angina pectoris, unspecified: Secondary | ICD-10-CM

## 2020-03-22 DIAGNOSIS — Z951 Presence of aortocoronary bypass graft: Secondary | ICD-10-CM

## 2020-03-22 DIAGNOSIS — Z8249 Family history of ischemic heart disease and other diseases of the circulatory system: Secondary | ICD-10-CM

## 2020-03-22 DIAGNOSIS — I25119 Atherosclerotic heart disease of native coronary artery with unspecified angina pectoris: Secondary | ICD-10-CM | POA: Diagnosis not present

## 2020-03-22 DIAGNOSIS — E088 Diabetes mellitus due to underlying condition with unspecified complications: Secondary | ICD-10-CM

## 2020-03-22 NOTE — Progress Notes (Signed)
Cardiology Office Note:    Date:  03/22/2020   ID:  Meghan Terry, DOB May 16, 1961, MRN [id number]  PCP:  Garwin Brothers, MD  Cardiologist:  Jenean Lindau, MD   Referring MD: Garwin Brothers, MD    ASSESSMENT:    1. Angina pectoris ([place])   2. Coronary artery disease involving native coronary artery of native heart with angina pectoris ([place])   3. Essential hypertension   4. Hyperlipidemia with target LDL less than 70   5. Diabetes mellitus due to underlying condition with unspecified complications ([place])   6. Family history of coronary artery disease   7. Presence of drug coated stent in right coronary artery   8. S/P CABG x 3    PLAN:    In order of problems listed above:  1. Coronary artery disease: Secondary prevention stressed with the patient.  Importance of compliance with diet medication stressed and she vocalized understanding. 2. Low blood pressure: I think this is what is causing her symptoms suggesting of orthostatic hypotension and fatigue.  Have stopped her amlodipine and Imdur in view of her coronary angiography findings.  She is taking her medicine.  Importance of daily activity stressed.  In view of the symptoms and the fact that she has chest pain have kept her off for the next month after which she will be seen in follow-up appointment and will assess her. 3. Mixed dyslipidemia: Diet was emphasized and she will have blood work when she comes back to see me. 4. Patient had multiple questions which were answered to her satisfaction.   Medication Adjustments/Labs and Tests Ordered: Current medicines are reviewed at length with the patient today.  Concerns regarding medicines are outlined above.  No orders of the defined types were placed in this encounter.  No orders of the defined types were placed in this encounter.    No chief complaint on file.    History of Present Illness:    Meghan Terry is a 59 y.o. female.  Patient has past medical history of coronary artery  disease, essential hypertension and dyslipidemia.  She has diabetes mellitus.  She had anginal-like pain and went to the hospital and underwent coronary angiography.  The details are mentioned below.  She denies any chest pain orthopnea or PND at the current time.  Coronary angiography was unremarkable.  She feels very lightheaded and dizzy and tired whenever she changes posture.  She is on amlodipine and Imdur.  At the time of my evaluation, the patient is alert awake oriented and in no distress.  Past Medical History:  Diagnosis Date  . Angina pectoris ([place]) 11/17/2019  . Anxiety   . CKD (chronic kidney disease) stage 3b, GFR 30-59 ml/min 12/03/2019  . Coronary artery disease   . Coronary artery disease involving native coronary artery of native heart with angina pectoris ([place]) 10/03/2019  . Depression 04/25/2017  . Diabetes mellitus due to underlying condition with unspecified complications ([place]) 49/02/7590  . Essential hypertension 09/05/2019  . Ex-smoker 09/05/2019  . Family history of coronary artery disease 09/05/2019  . Hyperlipidemia with target LDL less than 70 11/24/2019  . Hypertension   . Hypothyroid   . Migraine 04/25/2017  . 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 Presence of drug coated stent in right coronary artery 11/24/2019  . Pyelonephritis 05/20/2016  . RLS (restless legs syndrome)   . 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 Thyroid disease     Past Surgical History:  Procedure Laterality Date  .  ABDOMINAL HYSTERECTOMY    . APPENDECTOMY    . CARDIAC CATHETERIZATION  11/22/2019  . CHOLECYSTECTOMY    . COLONOSCOPY    . CORONARY ARTERY BYPASS GRAFT N/A 09/15/2019   Procedure: CORONARY ARTERY BYPASS GRAFTING (CABG) times three on pump using left internal mammary artery and right and left greater saphenous veins harvested endoscopically;  Surgeon: Gaye Pollack, MD;  Location: [facility];  Service: Open Heart Surgery;  Laterality: N/A;  . CORONARY STENT INTERVENTION N/A 11/23/2019   Procedure: CORONARY  STENT INTERVENTION;  Surgeon: [name], MD;  Location: [facility];  Service: Cardiovascular;  Laterality: N/A;  . KNEE ARTHROSCOPY    . LEFT HEART CATH AND CORONARY ANGIOGRAPHY N/A 09/14/2019   Procedure: LEFT HEART CATH AND CORONARY ANGIOGRAPHY;  Surgeon: [name], MD;  Location: [facility];  Service: Cardiovascular;  Laterality: N/A;  . LEFT HEART CATH AND CORS/GRAFTS ANGIOGRAPHY N/A 11/22/2019   Procedure: LEFT HEART CATH AND CORS/GRAFTS ANGIOGRAPHY;  Surgeon: [name], MD;  Location: [facility];  Service: Cardiovascular;  Laterality: N/A;  . LEFT HEART CATH AND CORS/GRAFTS ANGIOGRAPHY N/A 03/13/2020   Procedure: LEFT HEART CATH AND CORS/GRAFTS ANGIOGRAPHY;  Surgeon: Lorretta Harp, MD;  Location: [facility];  Service: Cardiovascular;  Laterality: N/A;  . OSTEOCHONDRAL DEFECT REPAIR/RECONSTRUCTION Left 11/29/2014   Procedure: LEFT ANKLE MEDIAL MALLEOLUS OSTEOTOMY,AUTO GRAFT FROM CALCANEOUS;  OS TIBIA DENORO GRAFTING TALUS;  Surgeon: Wylene Simmer, MD;  Location: [facility];  Service: Orthopedics;  Laterality: Left;  . TEE WITHOUT CARDIOVERSION N/A 09/15/2019   Procedure: TRANSESOPHAGEAL ECHOCARDIOGRAM (TEE);  Surgeon: Gaye Pollack, MD;  Location: [facility];  Service: Open Heart Surgery;  Laterality: N/A;  . TUBAL LIGATION      Current Medications: Current Meds  Medication Sig  . acetaminophen (TYLENOL) 500 MG tablet Take 500-1,000 mg by mouth every 6 (six) hours as needed (for pain.).  Marland Kitchen amLODipine (NORVASC) 2.5 MG tablet TAKE 1 TABLET BY MOUTH DAILY. (Patient taking differently: Take 2.5 mg by mouth daily. )  . aspirin EC 81 MG tablet Take 1 tablet (81 mg total) by mouth daily.  Marland Kitchen buPROPion (WELLBUTRIN XL) 300 MG 24 hr tablet Take 1 tablet (300 mg total) by mouth every morning.  . clopidogrel (PLAVIX) 75 MG tablet TAKE 1 TABLET BY MOUTH DAILY (Patient taking differently: Take 75 mg by mouth in the morning. )  . ferrous  sulfate 325 (65 FE) MG tablet Take 1 tablet (325 mg total) by mouth daily with breakfast.  . levothyroxine (SYNTHROID) 75 MCG tablet Take 75 mcg by mouth daily before breakfast.  . metoprolol tartrate (LOPRESSOR) 25 MG tablet TAKE 1 AND 1/2 TABLETS BY MOUTH 2 TIMES DAILY. (Patient taking differently: Take 37.5 mg by mouth 2 (two) times daily. )  . nitroGLYCERIN (NITROSTAT) 0.4 MG SL tablet Place 0.4 mg under the tongue See admin instructions. Place 0.4 mg sublingually four to five times a day as needed for chest pain  . ondansetron (ZOFRAN) 4 MG tablet Take 1 tablet (4 mg total) by mouth every 8 (eight) hours as needed for nausea or vomiting.  . ranolazine (RANEXA) 1000 MG SR tablet Take 1 tablet (1,000 mg total) by mouth 2 (two) times daily.  Marland Kitchen rOPINIRole (REQUIP) 1 MG tablet Take 1 mg by mouth 3 (three) times daily.   Marland Kitchen rOPINIRole (REQUIP) 4 MG tablet Take 4 mg by mouth at bedtime.  . rosuvastatin (CRESTOR) 40 MG tablet  Take 1 tablet (40 mg total) by mouth at bedtime.  . topiramate (TOPAMAX) 100 MG tablet Take 100 mg by mouth 2 (two) times daily.  . traMADol (ULTRAM) 50 MG tablet Take 1 tablet (50 mg total) by mouth every 12 (twelve) hours as needed (pain).  . traZODone (DESYREL) 150 MG tablet Take 150 mg by mouth at bedtime.      Allergies:   Carbidopa-levodopa, Prednisone, and Venlafaxine   Social History   Socioeconomic History  . Marital status: Married    Spouse name: Not on file  . Number of children: Not on file  . Years of education: Not on file  . Highest education level: Not on file  Occupational History  . Not on file  Tobacco Use  . Smoking status: Former Research scientist (life sciences)  . Smokeless tobacco: Never Used  Vaping Use  . Vaping Use: Every day  . Substances: Nicotine  Substance and Sexual Activity  . Alcohol use: No  . Drug use: No  . Sexual activity: Not on file    Comment: e-cig  Other Topics Concern  . Not on file  Social History Narrative  . Not on file   Social  Determinants of Health   Financial Resource Strain:   . Difficulty of Paying Living Expenses:   Food Insecurity:   . Worried About Charity fundraiser in the Last Year:   . Arboriculturist in the Last Year:   Transportation Needs:   . Film/video editor (Medical):   Marland Kitchen Lack of Transportation (Non-Medical):   Physical Activity:   . Days of Exercise per Week:   . Minutes of Exercise per Session:   Stress:   . Feeling of Stress :   Social Connections:   . Frequency of Communication with Friends and Family:   . Frequency of Social Gatherings with Friends and Family:   . Attends Religious Services:   . Active Member of Clubs or Organizations:   . Attends Archivist Meetings:   Marland Kitchen Marital Status:      Family History: The patient's family history includes Asthma in her mother; COPD in her mother; Congestive Heart Failure in her father; Diabetes in her mother; Macular degeneration in her mother.  ROS:   Please see the history of present illness.    All other systems reviewed and are negative.  EKGs/Labs/Other Studies Reviewed:    The following studies were reviewed today: Lorretta Harp, MD (Primary)    Procedures  LEFT HEART CATH AND CORS/GRAFTS ANGIOGRAPHY  Conclusion    Ost RCA to Prox RCA lesion is 30% stenosed.  Previously placed Prox RCA to Dist RCA stent (unknown type) is widely patent.  Previously placed RPAV stent (unknown type) is widely patent.  RPDA lesion is 100% stenosed.  Ost Cx to Prox Cx lesion is 100% stenosed.  3rd Mrg lesion is 100% stenosed.  Mid Cx to Dist Cx lesion is 100% stenosed.  Origin to Prox Graft lesion is 75% stenosed.  Mid LAD-1 lesion is 75% stenosed.  Mid LAD-2 lesion is 95% stenosed.       [id number] LOCATION:  FACILITY: [facility]  PHYSICIAN: Quay Burow, M.D.    Recent Labs: 09/16/2019: Magnesium 2.4 11/27/2019: ALT 6 03/12/2020: TSH 0.932 03/13/2020: BUN 19; Creatinine, Ser 1.66; Hemoglobin 11.2;  Platelets 221; Potassium 3.7; Sodium 141  Recent Lipid Panel    Component Value Date/Time   CHOL 145 11/27/2019 0821   TRIG 90 11/27/2019 0821   HDL 67 11/27/2019  0821   CHOLHDL 2.2 11/27/2019 0821   CHOLHDL 3.3 09/18/2019 0355   VLDL 24 09/18/2019 0355   LDLCALC 61 11/27/2019 0821    Physical Exam:    VS:  BP 130/70   Pulse 82   Ht 5\' 3"  (1.6 m)   Wt 151 lb (68.5 kg)   SpO2 99%   BMI 26.75 kg/m     Wt Readings from Last 3 Encounters:  03/22/20 151 lb (68.5 kg)  03/13/20 149 lb 11.2 oz (67.9 kg)  01/09/20 147 lb (66.7 kg)     GEN: Patient is in no acute distress HEENT: Normal NECK: No JVD; No carotid bruits LYMPHATICS: No lymphadenopathy CARDIAC: Hear sounds regular, 2/6 systolic murmur at the apex. RESPIRATORY:  Clear to auscultation without rales, wheezing or rhonchi  ABDOMEN: Soft, non-tender, non-distended MUSCULOSKELETAL:  No edema; No deformity  SKIN: Warm and dry NEUROLOGIC:  Alert and oriented x 3 PSYCHIATRIC:  Normal affect   Signed, Jenean Lindau, MD  03/22/2020 9:05 AM    [place]

## 2020-03-22 NOTE — Patient Instructions (Addendum)
Medication Instructions:  Your physician has recommended you make the following change in your medication:   Stop Amlodipine and Imdur.  *If you need a refill on your cardiac medications before your next appointment, please call your pharmacy*   Lab Work: None ordered If you have labs (blood work) drawn today and your tests are completely normal, you will receive your results only by: Marland Kitchen MyChart Message (if you have MyChart) OR . A paper copy in the mail If you have any lab test that is abnormal or we need to change your treatment, we will call you to review the results.   Testing/Procedures: None ordered   Follow-Up: At [facility], you and your health needs are our priority.  As part of our continuing mission to provide you with exceptional heart care, we have created designated Provider Care Teams.  These Care Teams include your primary Cardiologist (physician) and Advanced Practice Providers (APPs -  Physician Assistants and Nurse Practitioners) who all work together to provide you with the care you need, when you need it.  We recommend signing up for the patient portal called "MyChart".  Sign up information is provided on this After Visit Summary.  MyChart is used to connect with patients for Virtual Visits (Telemedicine).  Patients are able to view lab/test results, encounter notes, upcoming appointments, etc.  Non-urgent messages can be sent to your provider as well.   To learn more about what you can do with MyChart, go to [email or web address].    Your next appointment:   1 month(s)  The format for your next appointment:   In Person  Provider:   Jyl Heinz, MD   Other Instructions  Come fasting at your next appointment.

## 2020-03-22 NOTE — Addendum Note (Signed)
Addended by: Truddie Hidden on: 03/22/2020 09:37 AM   Modules accepted: Orders

## 2020-03-23 ENCOUNTER — Other Ambulatory Visit: Payer: Self-pay | Admitting: Cardiology

## 2020-03-25 ENCOUNTER — Other Ambulatory Visit: Payer: Self-pay | Admitting: Cardiology

## 2020-03-26 ENCOUNTER — Telehealth: Payer: Self-pay

## 2020-03-26 NOTE — Telephone Encounter (Signed)
Pt advised to go to the ED/Urgent Care or PCP regarding her lt hand being blue and swollen.

## 2020-04-11 ENCOUNTER — Other Ambulatory Visit: Payer: Self-pay | Admitting: Cardiology

## 2020-04-15 ENCOUNTER — Other Ambulatory Visit (HOSPITAL_BASED_OUTPATIENT_CLINIC_OR_DEPARTMENT_OTHER): Payer: Self-pay | Admitting: Cardiovascular Disease

## 2020-04-15 DIAGNOSIS — Z8659 Personal history of other mental and behavioral disorders: Secondary | ICD-10-CM

## 2020-04-15 DIAGNOSIS — G2581 Restless legs syndrome: Secondary | ICD-10-CM

## 2020-04-15 DIAGNOSIS — Z8639 Personal history of other endocrine, nutritional and metabolic disease: Secondary | ICD-10-CM | POA: Insufficient documentation

## 2020-04-15 HISTORY — DX: Restless legs syndrome: G25.81

## 2020-04-15 HISTORY — DX: Personal history of other endocrine, nutritional and metabolic disease: Z86.39

## 2020-04-15 HISTORY — DX: Personal history of other mental and behavioral disorders: Z86.59

## 2020-04-25 ENCOUNTER — Ambulatory Visit: Payer: Commercial Managed Care - PPO | Admitting: Cardiology

## 2020-05-07 ENCOUNTER — Encounter: Payer: Self-pay | Admitting: Cardiology

## 2020-05-07 ENCOUNTER — Ambulatory Visit: Payer: Commercial Managed Care - PPO | Admitting: Cardiology

## 2020-05-07 ENCOUNTER — Other Ambulatory Visit: Payer: Self-pay

## 2020-05-07 VITALS — BP 132/68 | HR 80 | Ht 63.0 in | Wt 147.4 lb

## 2020-05-07 DIAGNOSIS — E785 Hyperlipidemia, unspecified: Secondary | ICD-10-CM | POA: Diagnosis not present

## 2020-05-07 DIAGNOSIS — Z951 Presence of aortocoronary bypass graft: Secondary | ICD-10-CM

## 2020-05-07 DIAGNOSIS — I25119 Atherosclerotic heart disease of native coronary artery with unspecified angina pectoris: Secondary | ICD-10-CM

## 2020-05-07 DIAGNOSIS — I10 Essential (primary) hypertension: Secondary | ICD-10-CM

## 2020-05-07 DIAGNOSIS — E088 Diabetes mellitus due to underlying condition with unspecified complications: Secondary | ICD-10-CM | POA: Diagnosis not present

## 2020-05-07 NOTE — Patient Instructions (Signed)
Medication Instructions:  No medication changes. *If you need a refill on your cardiac medications before your next appointment, please call your pharmacy*   Lab Work: Your physician recommends that you have a BMET, LFT's and Lipids.  If you have labs (blood work) drawn today and your tests are completely normal, you will receive your results only by: Marland Kitchen MyChart Message (if you have MyChart) OR . A paper copy in the mail If you have any lab test that is abnormal or we need to change your treatment, we will call you to review the results.   Testing/Procedures: None ordered   Follow-Up: At [facility], you and your health needs are our priority.  As part of our continuing mission to provide you with exceptional heart care, we have created designated Provider Care Teams.  These Care Teams include your primary Cardiologist (physician) and Advanced Practice Providers (APPs -  Physician Assistants and Nurse Practitioners) who all work together to provide you with the care you need, when you need it.  We recommend signing up for the patient portal called "MyChart".  Sign up information is provided on this After Visit Summary.  MyChart is used to connect with patients for Virtual Visits (Telemedicine).  Patients are able to view lab/test results, encounter notes, upcoming appointments, etc.  Non-urgent messages can be sent to your provider as well.   To learn more about what you can do with MyChart, go to [email or web address].    Your next appointment:   3 month(s)  The format for your next appointment:   In Person  Provider:   Jyl Heinz, MD   Other Instructions NA

## 2020-05-07 NOTE — Progress Notes (Signed)
Cardiology Office Note:    Date:  05/07/2020   ID:  Meghan Terry, DOB Jul 07, 1961, MRN [id number]  PCP:  Garwin Brothers, MD  Cardiologist:  Jenean Lindau, MD   Referring MD: Garwin Brothers, MD    ASSESSMENT:    1. Coronary artery disease involving native coronary artery of native heart with angina pectoris ([place])   2. Essential hypertension   3. Hyperlipidemia with target LDL less than 70   4. Diabetes mellitus due to underlying condition with unspecified complications ([place])   5. S/P CABG x 3    PLAN:    In order of problems listed above:  1. Coronary artery disease: Secondary prevention stressed with the patient.  Importance of compliance with diet medication stressed and she vocalized understanding.  Aggressive medical therapy is the way to go for her and treat is working for her.  I reviewed [facility] records extensively.   2. Essential hypertension: Blood pressure stable.  Diet was emphasized 3. Mixed dyslipidemia: Diet was emphasized.  She will have blood work today including fasting lipids 4. Diabetes mellitus: Managed by primary care physician.  We will get hemoglobin A1c for review by primary care physician. 5. Patient will be seen in follow-up appointment in 6 months or earlier if the patient has any concerns    Medication Adjustments/Labs and Tests Ordered: Current medicines are reviewed at length with the patient today.  Concerns regarding medicines are outlined above.  No orders of the defined types were placed in this encounter.  No orders of the defined types were placed in this encounter.    No chief complaint on file.    History of Present Illness:    Meghan Terry is a 60 y.o. female.  Patient has past medical history of coronary artery disease post CABG surgery essential hypertension dyslipidemia and diabetes mellitus.  She has issues with chest pain which is pretty much constant.  She rides the exercise bicycle on a regular basis.  With this the chest pain does  not get better or worse.  She is on short-term disability for the same reason.  She denies any problems other than this at this time.  She went to [facility] and obtain consultation there and the recommendations was for medical therapy.  At the time of my evaluation, the patient is alert awake oriented and in no distress.  Past Medical History:  Diagnosis Date  . Angina pectoris ([place]) 11/17/2019  . Anxiety   . CKD (chronic kidney disease) stage 3b, GFR 30-59 ml/min 12/03/2019  . Coronary artery disease   . Coronary artery disease involving native coronary artery of native heart with angina pectoris ([place]) 10/03/2019  . Depression 04/25/2017  . Diabetes mellitus due to underlying condition with unspecified complications ([place]) 67/02/7208  . Essential hypertension 09/05/2019  . Ex-smoker 09/05/2019  . Family history of coronary artery disease 09/05/2019  . Hyperlipidemia with target LDL less than 70 11/24/2019  . Hypertension   . Hypothyroid   . Migraine 04/25/2017  . 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 Presence of drug coated stent in right coronary artery 11/24/2019  . Pyelonephritis 05/20/2016  . RLS (restless legs syndrome)   . 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 Thyroid disease     Past Surgical History:  Procedure Laterality Date  . ABDOMINAL HYSTERECTOMY    . APPENDECTOMY    . CARDIAC CATHETERIZATION  11/22/2019  . CHOLECYSTECTOMY    . COLONOSCOPY    . CORONARY ARTERY BYPASS  GRAFT N/A 09/15/2019   Procedure: CORONARY ARTERY BYPASS GRAFTING (CABG) times three on pump using left internal mammary artery and right and left greater saphenous veins harvested endoscopically;  Surgeon: Gaye Pollack, MD;  Location: [facility];  Service: Open Heart Surgery;  Laterality: N/A;  . CORONARY STENT INTERVENTION N/A 11/23/2019   Procedure: CORONARY STENT INTERVENTION;  Surgeon: [name], MD;  Location: [facility];  Service: Cardiovascular;  Laterality: N/A;  . KNEE ARTHROSCOPY    . LEFT  HEART CATH AND CORONARY ANGIOGRAPHY N/A 09/14/2019   Procedure: LEFT HEART CATH AND CORONARY ANGIOGRAPHY;  Surgeon: [name], MD;  Location: [facility];  Service: Cardiovascular;  Laterality: N/A;  . LEFT HEART CATH AND CORS/GRAFTS ANGIOGRAPHY N/A 11/22/2019   Procedure: LEFT HEART CATH AND CORS/GRAFTS ANGIOGRAPHY;  Surgeon: [name], MD;  Location: [facility];  Service: Cardiovascular;  Laterality: N/A;  . LEFT HEART CATH AND CORS/GRAFTS ANGIOGRAPHY N/A 03/13/2020   Procedure: LEFT HEART CATH AND CORS/GRAFTS ANGIOGRAPHY;  Surgeon: Lorretta Harp, MD;  Location: [facility];  Service: Cardiovascular;  Laterality: N/A;  . OSTEOCHONDRAL DEFECT REPAIR/RECONSTRUCTION Left 11/29/2014   Procedure: LEFT ANKLE MEDIAL MALLEOLUS OSTEOTOMY,AUTO GRAFT FROM CALCANEOUS;  OS TIBIA DENORO GRAFTING TALUS;  Surgeon: Wylene Simmer, MD;  Location: [facility];  Service: Orthopedics;  Laterality: Left;  . TEE WITHOUT CARDIOVERSION N/A 09/15/2019   Procedure: TRANSESOPHAGEAL ECHOCARDIOGRAM (TEE);  Surgeon: Gaye Pollack, MD;  Location: [facility];  Service: Open Heart Surgery;  Laterality: N/A;  . TUBAL LIGATION      Current Medications: Current Meds  Medication Sig  . acetaminophen (TYLENOL) 500 MG tablet Take 500-1,000 mg by mouth every 6 (six) hours as needed (for pain.).  Marland Kitchen aspirin EC 81 MG tablet Take 1 tablet (81 mg total) by mouth daily.  Marland Kitchen buPROPion (WELLBUTRIN XL) 300 MG 24 hr tablet Take 1 tablet (300 mg total) by mouth every morning.  . clopidogrel (PLAVIX) 75 MG tablet TAKE 1 TABLET BY MOUTH DAILY  . ferrous sulfate 325 (65 FE) MG tablet Take 1 tablet (325 mg total) by mouth daily with breakfast.  . isosorbide mononitrate (IMDUR) 30 MG 24 hr tablet Take 30 mg by mouth daily.  Marland Kitchen levothyroxine (SYNTHROID) 75 MCG tablet Take 75 mcg by mouth daily before breakfast.  . metFORMIN (GLUCOPHAGE) 500 MG tablet Take by mouth 2 (two) times daily with a meal.  .  metoprolol tartrate (LOPRESSOR) 50 MG tablet Take 50 mg by mouth 2 (two) times daily.  . nitroGLYCERIN (NITROSTAT) 0.4 MG SL tablet Place 0.4 mg under the tongue See admin instructions. Place 0.4 mg sublingually four to five times a day as needed for chest pain  . ondansetron (ZOFRAN) 4 MG tablet Take 1 tablet (4 mg total) by mouth every 8 (eight) hours as needed for nausea or vomiting.  . ranolazine (RANEXA) 1000 MG SR tablet Take 1 tablet (1,000 mg total) by mouth 2 (two) times daily.  Marland Kitchen rOPINIRole (REQUIP) 1 MG tablet Take 1 mg by mouth 3 (three) times daily.   Marland Kitchen rOPINIRole (REQUIP) 4 MG tablet Take 4 mg by mouth at bedtime.  . rosuvastatin (CRESTOR) 40 MG tablet Take 1 tablet (40 mg total) by mouth at bedtime.  . topiramate (TOPAMAX) 100 MG tablet Take 100 mg by mouth 2 (two) times daily.  . traMADol (ULTRAM) 50 MG tablet Take 1 tablet (50 mg total) by mouth every 12 (twelve) hours as  needed (pain).  . traZODone (DESYREL) 150 MG tablet Take 150 mg by mouth at bedtime.      Allergies:   Carbidopa-levodopa, Prednisone, and Venlafaxine   Social History   Socioeconomic History  . Marital status: Married    Spouse name: Not on file  . Number of children: Not on file  . Years of education: Not on file  . Highest education level: Not on file  Occupational History  . Not on file  Tobacco Use  . Smoking status: Former Research scientist (life sciences)  . Smokeless tobacco: Never Used  Vaping Use  . Vaping Use: Every day  . Substances: Nicotine  Substance and Sexual Activity  . Alcohol use: No  . Drug use: No  . Sexual activity: Not on file    Comment: e-cig  Other Topics Concern  . Not on file  Social History Narrative  . Not on file   Social Determinants of Health   Financial Resource Strain:   . Difficulty of Paying Living Expenses:   Food Insecurity:   . Worried About Charity fundraiser in the Last Year:   . Arboriculturist in the Last Year:   Transportation Needs:   . Film/video editor  (Medical):   Marland Kitchen Lack of Transportation (Non-Medical):   Physical Activity:   . Days of Exercise per Week:   . Minutes of Exercise per Session:   Stress:   . Feeling of Stress :   Social Connections:   . Frequency of Communication with Friends and Family:   . Frequency of Social Gatherings with Friends and Family:   . Attends Religious Services:   . Active Member of Clubs or Organizations:   . Attends Archivist Meetings:   Marland Kitchen Marital Status:      Family History: The patient's family history includes Asthma in her mother; COPD in her mother; Congestive Heart Failure in her father; Diabetes in her mother; Macular degeneration in her mother.  ROS:   Please see the history of present illness.    All other systems reviewed and are negative.  EKGs/Labs/Other Studies Reviewed:    The following studies were reviewed today: LEFT HEART CATH AND CORS/GRAFTS ANGIOGRAPHY  Conclusion    Ost RCA to Prox RCA lesion is 30% stenosed.  Previously placed Prox RCA to Dist RCA stent (unknown type) is widely patent.  Previously placed RPAV stent (unknown type) is widely patent.  RPDA lesion is 100% stenosed.  Ost Cx to Prox Cx lesion is 100% stenosed.  3rd Mrg lesion is 100% stenosed.  Mid Cx to Dist Cx lesion is 100% stenosed.  Origin to Prox Graft lesion is 75% stenosed.  Mid LAD-1 lesion is 75% stenosed.  Mid LAD-2 lesion is 95% stenosed.   Meghan Terry is a 59 y.o. female    [id number] LOCATION:  FACILITY: [facility]  PHYSICIAN: Quay Burow, M.D. May 05, 1961   DATE OF PROCEDURE:  03/13/2020    Recent Labs: 09/16/2019: Magnesium 2.4 11/27/2019: ALT 6 03/12/2020: TSH 0.932 03/13/2020: BUN 19; Creatinine, Ser 1.66; Hemoglobin 11.2; Platelets 221; Potassium 3.7; Sodium 141  Recent Lipid Panel    Component Value Date/Time   CHOL 145 11/27/2019 0821   TRIG 90 11/27/2019 0821   HDL 67 11/27/2019 0821   CHOLHDL 2.2 11/27/2019 0821   CHOLHDL 3.3 09/18/2019 0355    VLDL 24 09/18/2019 0355   LDLCALC 61 11/27/2019 0821    Physical Exam:    VS:  BP 132/68   Pulse 80  Ht 5\' 3"  (1.6 m)   Wt 147 lb 6.4 oz (66.9 kg)   SpO2 97%   BMI 26.11 kg/m     Wt Readings from Last 3 Encounters:  05/07/20 147 lb 6.4 oz (66.9 kg)  03/22/20 151 lb (68.5 kg)  03/13/20 149 lb 11.2 oz (67.9 kg)     GEN: Patient is in no acute distress HEENT: Normal NECK: No JVD; No carotid bruits LYMPHATICS: No lymphadenopathy CARDIAC: Hear sounds regular, 2/6 systolic murmur at the apex. RESPIRATORY:  Clear to auscultation without rales, wheezing or rhonchi  ABDOMEN: Soft, non-tender, non-distended MUSCULOSKELETAL:  No edema; No deformity  SKIN: Warm and dry NEUROLOGIC:  Alert and oriented x 3 PSYCHIATRIC:  Normal affect   Signed, Jenean Lindau, MD  05/07/2020 8:07 AM    [place]

## 2020-05-08 LAB — HEPATIC FUNCTION PANEL
ALT: 11 IU/L (ref 0–32)
AST: 16 IU/L (ref 0–40)
Albumin: 4.1 g/dL (ref 3.8–4.8)
Alkaline Phosphatase: 81 IU/L (ref 48–121)
Bilirubin Total: 0.2 mg/dL (ref 0.0–1.2)
Bilirubin, Direct: 0.1 mg/dL (ref 0.00–0.40)
Total Protein: 6.5 g/dL (ref 6.0–8.5)

## 2020-05-08 LAB — BASIC METABOLIC PANEL
BUN/Creatinine Ratio: 13 (ref 12–28)
BUN: 22 mg/dL (ref 8–27)
CO2: 19 mmol/L — ABNORMAL LOW (ref 20–29)
Calcium: 9.6 mg/dL (ref 8.7–10.3)
Chloride: 106 mmol/L (ref 96–106)
Creatinine, Ser: 1.75 mg/dL — ABNORMAL HIGH (ref 0.57–1.00)
GFR calc Af Amer: 36 mL/min/{1.73_m2} — ABNORMAL LOW (ref >59)
GFR calc non Af Amer: 31 mL/min/{1.73_m2} — ABNORMAL LOW (ref >59)
Glucose: 105 mg/dL — ABNORMAL HIGH (ref 65–99)
Potassium: 4.6 mmol/L (ref 3.5–5.2)
Sodium: 138 mmol/L (ref 134–144)

## 2020-05-08 LAB — LIPID PANEL
Chol/HDL Ratio: 2.4 ratio (ref 0.0–4.4)
Cholesterol, Total: 120 mg/dL (ref 100–199)
HDL: 51 mg/dL (ref >39)
LDL Chol Calc (NIH): 41 mg/dL (ref 0–99)
Triglycerides: 174 mg/dL — ABNORMAL HIGH (ref 0–149)
VLDL Cholesterol Cal: 28 mg/dL (ref 5–40)

## 2020-05-08 LAB — HEMOGLOBIN A1C
Est. average glucose Bld gHb Est-mCnc: 97 mg/dL
Hgb A1c MFr Bld: 5 % (ref 4.8–5.6)

## 2020-05-13 ENCOUNTER — Other Ambulatory Visit (HOSPITAL_BASED_OUTPATIENT_CLINIC_OR_DEPARTMENT_OTHER): Payer: Self-pay | Admitting: Student

## 2020-08-07 ENCOUNTER — Ambulatory Visit: Payer: Commercial Managed Care - PPO | Admitting: Cardiology

## 2020-09-02 ENCOUNTER — Other Ambulatory Visit: Payer: Self-pay | Admitting: Cardiology

## 2020-09-25 MED FILL — CLOPIDOGREL 75 MG TABLET: 75 | 90 days supply | Qty: 90 | Fill #0

## 2020-09-25 MED FILL — NITROGLYCERIN 0.4 MG TAB SL: 0.4 | 5 days supply | Qty: 25 | Fill #0

## 2020-09-25 MED FILL — ROSUVASTATIN CALCIUM 40 MG: 40 | 90 days supply | Qty: 90 | Fill #0

## 2020-09-25 MED FILL — RANOLAZINE ER 1000 MG TB12: 1000 | 90 days supply | Qty: 180 | Fill #0

## 2020-10-09 MED FILL — ISOSORBIDE MN ER 30 MG TAB: 30 | 90 days supply | Qty: 90 | Fill #0

## 2020-10-25 ENCOUNTER — Other Ambulatory Visit (HOSPITAL_BASED_OUTPATIENT_CLINIC_OR_DEPARTMENT_OTHER): Payer: Self-pay | Admitting: Student

## 2020-10-25 MED FILL — AMOX-CLAV 875-125 MG TABLET: 875-125 | 7 days supply | Qty: 14 | Fill #0

## 2020-10-28 ENCOUNTER — Other Ambulatory Visit (HOSPITAL_BASED_OUTPATIENT_CLINIC_OR_DEPARTMENT_OTHER): Payer: Self-pay | Admitting: Student

## 2020-10-28 MED FILL — LEVOTHYROXINE SODIUM 50 MCG: 50 | 84 days supply | Qty: 24 | Fill #0

## 2020-10-28 MED FILL — METOPROLOL TARTRATE 25 MG T: 25 | 30 days supply | Qty: 30 | Fill #0 | Status: TO

## 2020-10-28 MED FILL — rOPINIRole HCL 1 MG TABS: 1 | 90 days supply | Qty: 270 | Fill #0

## 2020-10-28 MED FILL — traZODone HCL 150 MG TABS: 150 | 90 days supply | Qty: 90 | Fill #0

## 2020-10-28 MED FILL — NITROGLYCERIN 0.4 MG TAB SL: 0.4 | 5 days supply | Qty: 25 | Fill #1

## 2020-10-28 MED FILL — LEVOTHYROXINE SODIUM 75 MCG: 75 | 89 days supply | Qty: 64 | Fill #0

## 2020-11-14 MED FILL — buPROPion HCL ER (XL) 300 M: 300 | 90 days supply | Qty: 90 | Fill #0

## 2020-11-22 ENCOUNTER — Other Ambulatory Visit (HOSPITAL_BASED_OUTPATIENT_CLINIC_OR_DEPARTMENT_OTHER): Payer: Self-pay | Admitting: Student

## 2020-11-22 MED FILL — METOPROLOL TARTRATE 50 MG T: 50 | 90 days supply | Qty: 180 | Fill #0

## 2020-11-25 ENCOUNTER — Other Ambulatory Visit (HOSPITAL_BASED_OUTPATIENT_CLINIC_OR_DEPARTMENT_OTHER): Payer: Self-pay | Admitting: Student

## 2020-11-26 MED FILL — NITROGLYCERIN 0.4 MG TAB SL: 0.4 | 5 days supply | Qty: 25 | Fill #2

## 2020-12-02 ENCOUNTER — Other Ambulatory Visit: Payer: Self-pay | Admitting: Cardiology

## 2020-12-02 DIAGNOSIS — N183 Chronic kidney disease, stage 3 unspecified: Secondary | ICD-10-CM

## 2020-12-02 DIAGNOSIS — E785 Hyperlipidemia, unspecified: Secondary | ICD-10-CM

## 2020-12-02 DIAGNOSIS — I25119 Atherosclerotic heart disease of native coronary artery with unspecified angina pectoris: Secondary | ICD-10-CM

## 2020-12-02 DIAGNOSIS — Z951 Presence of aortocoronary bypass graft: Secondary | ICD-10-CM

## 2020-12-02 DIAGNOSIS — Z1329 Encounter for screening for other suspected endocrine disorder: Secondary | ICD-10-CM

## 2020-12-02 DIAGNOSIS — E088 Diabetes mellitus due to underlying condition with unspecified complications: Secondary | ICD-10-CM

## 2020-12-02 DIAGNOSIS — I10 Essential (primary) hypertension: Secondary | ICD-10-CM

## 2020-12-03 ENCOUNTER — Other Ambulatory Visit (HOSPITAL_BASED_OUTPATIENT_CLINIC_OR_DEPARTMENT_OTHER): Payer: Self-pay | Admitting: Student

## 2020-12-03 MED FILL — GENERLAC SOLUTION 10G/15ML: 10 | 3 days supply | Qty: 270 | Fill #0

## 2020-12-05 ENCOUNTER — Other Ambulatory Visit (HOSPITAL_BASED_OUTPATIENT_CLINIC_OR_DEPARTMENT_OTHER): Payer: Self-pay | Admitting: Student

## 2020-12-05 MED FILL — TOPIRAMATE 100 MG TABLET: 100 | 90 days supply | Qty: 180 | Fill #0

## 2020-12-05 MED FILL — ONDANSETRON HCL 4 MG TABLET: 4 | 90 days supply | Qty: 180 | Fill #0

## 2020-12-05 MED FILL — rOPINIRole HCL 4 MG TABS: 4 | 90 days supply | Qty: 90 | Fill #0

## 2020-12-24 MED FILL — NITROGLYCERIN 0.4 MG TAB SL: 0.4 | 5 days supply | Qty: 25 | Fill #3

## 2020-12-24 MED FILL — ROSUVASTATIN CALCIUM 40 MG: 40 | 90 days supply | Qty: 90 | Fill #1

## 2020-12-29 ENCOUNTER — Inpatient Hospital Stay (HOSPITAL_COMMUNITY)
Admission: EM | Admit: 2020-12-29 | Discharge: 2021-01-01 | DRG: 287 | Disposition: A | Discharge status: Home / Self Care | Payer: 59 | Attending: Family Medicine | Admitting: Family Medicine

## 2020-12-29 ENCOUNTER — Other Ambulatory Visit: Payer: Self-pay

## 2020-12-29 ENCOUNTER — Emergency Department (HOSPITAL_COMMUNITY): Payer: 59

## 2020-12-29 ENCOUNTER — Encounter (HOSPITAL_COMMUNITY): Payer: Self-pay

## 2020-12-29 DIAGNOSIS — Z888 Allergy status to other drugs, medicaments and biological substances status: Secondary | ICD-10-CM

## 2020-12-29 DIAGNOSIS — I129 Hypertensive chronic kidney disease with stage 1 through stage 4 chronic kidney disease, or unspecified chronic kidney disease: Secondary | ICD-10-CM | POA: Diagnosis present

## 2020-12-29 DIAGNOSIS — Z90710 Acquired absence of both cervix and uterus: Secondary | ICD-10-CM

## 2020-12-29 DIAGNOSIS — R079 Chest pain, unspecified: Secondary | ICD-10-CM | POA: Diagnosis present

## 2020-12-29 DIAGNOSIS — E1122 Type 2 diabetes mellitus with diabetic chronic kidney disease: Secondary | ICD-10-CM | POA: Diagnosis present

## 2020-12-29 DIAGNOSIS — I10 Essential (primary) hypertension: Secondary | ICD-10-CM | POA: Diagnosis present

## 2020-12-29 DIAGNOSIS — Z8249 Family history of ischemic heart disease and other diseases of the circulatory system: Secondary | ICD-10-CM

## 2020-12-29 DIAGNOSIS — Z7982 Long term (current) use of aspirin: Secondary | ICD-10-CM

## 2020-12-29 DIAGNOSIS — I25119 Atherosclerotic heart disease of native coronary artery with unspecified angina pectoris: Secondary | ICD-10-CM | POA: Diagnosis not present

## 2020-12-29 DIAGNOSIS — E876 Hypokalemia: Secondary | ICD-10-CM | POA: Diagnosis not present

## 2020-12-29 DIAGNOSIS — N1832 Chronic kidney disease, stage 3b: Secondary | ICD-10-CM | POA: Diagnosis present

## 2020-12-29 DIAGNOSIS — Z79899 Other long term (current) drug therapy: Secondary | ICD-10-CM

## 2020-12-29 DIAGNOSIS — Z9049 Acquired absence of other specified parts of digestive tract: Secondary | ICD-10-CM

## 2020-12-29 DIAGNOSIS — Z9851 Tubal ligation status: Secondary | ICD-10-CM

## 2020-12-29 DIAGNOSIS — N183 Chronic kidney disease, stage 3 unspecified: Secondary | ICD-10-CM | POA: Diagnosis present

## 2020-12-29 DIAGNOSIS — E785 Hyperlipidemia, unspecified: Secondary | ICD-10-CM | POA: Diagnosis present

## 2020-12-29 DIAGNOSIS — F329 Major depressive disorder, single episode, unspecified: Secondary | ICD-10-CM | POA: Diagnosis present

## 2020-12-29 DIAGNOSIS — G2581 Restless legs syndrome: Secondary | ICD-10-CM | POA: Diagnosis present

## 2020-12-29 DIAGNOSIS — R0789 Other chest pain: Secondary | ICD-10-CM | POA: Diagnosis not present

## 2020-12-29 DIAGNOSIS — Z87891 Personal history of nicotine dependence: Secondary | ICD-10-CM

## 2020-12-29 DIAGNOSIS — E039 Hypothyroidism, unspecified: Secondary | ICD-10-CM | POA: Diagnosis present

## 2020-12-29 DIAGNOSIS — Z79890 Hormone replacement therapy (postmenopausal): Secondary | ICD-10-CM

## 2020-12-29 DIAGNOSIS — Z951 Presence of aortocoronary bypass graft: Secondary | ICD-10-CM

## 2020-12-29 DIAGNOSIS — Z833 Family history of diabetes mellitus: Secondary | ICD-10-CM

## 2020-12-29 DIAGNOSIS — Z7902 Long term (current) use of antithrombotics/antiplatelets: Secondary | ICD-10-CM

## 2020-12-29 DIAGNOSIS — Z20822 Contact with and (suspected) exposure to covid-19: Secondary | ICD-10-CM | POA: Diagnosis present

## 2020-12-29 DIAGNOSIS — F419 Anxiety disorder, unspecified: Secondary | ICD-10-CM | POA: Diagnosis present

## 2020-12-29 DIAGNOSIS — Z7984 Long term (current) use of oral hypoglycemic drugs: Secondary | ICD-10-CM

## 2020-12-29 DIAGNOSIS — D631 Anemia in chronic kidney disease: Secondary | ICD-10-CM | POA: Diagnosis present

## 2020-12-29 DIAGNOSIS — E088 Diabetes mellitus due to underlying condition with unspecified complications: Secondary | ICD-10-CM | POA: Diagnosis present

## 2020-12-29 LAB — BASIC METABOLIC PANEL
Anion gap: 5 (ref 5–15)
BUN: 26 mg/dL — ABNORMAL HIGH (ref 8–23)
CO2: 19 mmol/L — ABNORMAL LOW (ref 22–32)
Calcium: 8.6 mg/dL — ABNORMAL LOW (ref 8.9–10.3)
Chloride: 115 mmol/L — ABNORMAL HIGH (ref 98–111)
Creatinine, Ser: 1.76 mg/dL — ABNORMAL HIGH (ref 0.44–1.00)
GFR, Estimated: 33 mL/min — ABNORMAL LOW (ref >60)
Glucose, Bld: 159 mg/dL — ABNORMAL HIGH (ref 70–99)
Potassium: 3.3 mmol/L — ABNORMAL LOW (ref 3.5–5.1)
Sodium: 139 mmol/L (ref 135–145)

## 2020-12-29 LAB — CBC
HCT: 31.7 % — ABNORMAL LOW (ref 36.0–46.0)
Hemoglobin: 10 g/dL — ABNORMAL LOW (ref 12.0–15.0)
MCH: 28.8 pg (ref 26.0–34.0)
MCHC: 31.5 g/dL (ref 30.0–36.0)
MCV: 91.4 fL (ref 80.0–100.0)
Platelets: 222 10*3/uL (ref 150–400)
RBC: 3.47 MIL/uL — ABNORMAL LOW (ref 3.87–5.11)
RDW: 14 % (ref 11.5–15.5)
WBC: 6.5 10*3/uL (ref 4.0–10.5)
nRBC: 0 % (ref 0.0–0.2)

## 2020-12-29 LAB — TROPONIN I (HIGH SENSITIVITY): Troponin I (High Sensitivity): 7 ng/L (ref <18)

## 2020-12-29 NOTE — ED Notes (Signed)
Pt c/o not feeling well again. States that she is cold and that the pain is starting to increase again. Provider aware

## 2020-12-29 NOTE — ED Notes (Signed)
PT REQUESTING TO HAVE SOMETHING TO DRINK AT THIS TIME. PROVIDER WAS SENT A MESSAGE REFERENCE TO SAME

## 2020-12-29 NOTE — ED Notes (Signed)
RETURNED FROM RADIOLOGY AT THIS TIME

## 2020-12-29 NOTE — ED Provider Notes (Signed)
[place] EMERGENCY DEPARTMENT Provider Note   CSN: [id number] Arrival date & time: 12/29/20  2057     History Chief Complaint  Patient presents with  . Chest Pain    Mid sternum cp    Meghan Terry is a 60 y.o. female.  HPI Patient presents via [facility] due to chest pain.  She notes that since bypass surgery 2 years ago she has had episodes similar to that she experienced today.  She states that the bypass surgery was unsuccessful, she subsequently did require stenting.  She has had no additional interventions, but has had multiple catheterizations. She notes that she typically has episodes of sternal pressure, similar to what she experienced over the past 24 hours.  There is no clear precipitant for this 1.  Pain did not improve after taking nitroglycerin at home, but has improved after receiving fentanyl in route. She denies fever, substantial dyspnea, nausea, vomiting, diarrhea. Per EMS the patient was normotensive in route, pain improved with fentanyl, as above.    Distal history obtained on chart review, including progress note from her cardiology visit last year as below: Meghan Terry is a 60 y.o. female.  Patient has past medical history of coronary artery disease post CABG surgery essential hypertension dyslipidemia and diabetes mellitus.  She has issues with chest pain which is pretty much constant.  She rides the exercise bicycle on a regular basis.  With this the chest pain does not get better or worse.  She is on short-term disability for the same reason.  She denies any problems other than this at this time.  She went to [facility] and obtain consultation there and the recommendations was for medical therapy.    Past Medical History:  Diagnosis Date  . Angina pectoris ([place]) 11/17/2019  . Anxiety   . CKD (chronic kidney disease) stage 3b, GFR 30-59 ml/min 12/03/2019  . Coronary artery disease   . Coronary artery disease  involving native coronary artery of native heart with angina pectoris ([place]) 10/03/2019  . Depression 04/25/2017  . Diabetes mellitus due to underlying condition with unspecified complications ([place]) 99991111  . Essential hypertension 09/05/2019  . Ex-smoker 09/05/2019  . Family history of coronary artery disease 09/05/2019  . Hyperlipidemia with target LDL less than 70 11/24/2019  . Hypertension   . Hypothyroid   . Migraine 04/25/2017  . 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 Presence of drug coated stent in right coronary artery 11/24/2019  . Pyelonephritis 05/20/2016  . RLS (restless legs syndrome)   . 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 Thyroid disease     Patient Active Problem List   Diagnosis Date Noted  . CKD (chronic kidney disease) stage 3b, GFR 30-59 ml/min 12/03/2019  . Hyperlipidemia with target LDL less than 70 11/24/2019  . Presence of drug coated stent in right coronary artery 11/24/2019  . Angina pectoris ([place]) 11/17/2019  . Coronary artery disease involving native coronary artery of native heart with angina pectoris ([place]) 10/03/2019  . 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 Essential hypertension 09/05/2019  . Diabetes mellitus due to underlying condition with unspecified complications ([place]) [id number]  . Ex-smoker 09/05/2019  . Family history of coronary artery disease 09/05/2019  . Migraine 04/25/2017  . 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 Hypothyroid 04/25/2017  . Depression 04/25/2017  . Pyelonephritis 05/20/2016    Past Surgical History:  Procedure Laterality Date  . ABDOMINAL HYSTERECTOMY    . APPENDECTOMY    .  CARDIAC CATHETERIZATION  11/22/2019  . CHOLECYSTECTOMY    . COLONOSCOPY    . CORONARY ARTERY BYPASS GRAFT N/A 09/15/2019   Procedure: CORONARY ARTERY BYPASS GRAFTING (CABG) times three on pump using left internal mammary artery and right and left greater saphenous veins harvested endoscopically;  Surgeon: Gaye Pollack, MD;  Location: [place] OR;  Service: Open Heart Surgery;   Laterality: N/A;  . CORONARY STENT INTERVENTION N/A 11/23/2019   Procedure: CORONARY STENT INTERVENTION;  Surgeon: [name], MD;  Location: [place] CV LAB;  Service: Cardiovascular;  Laterality: N/A;  . KNEE ARTHROSCOPY    . LEFT HEART CATH AND CORONARY ANGIOGRAPHY N/A 09/14/2019   Procedure: LEFT HEART CATH AND CORONARY ANGIOGRAPHY;  Surgeon: [name], MD;  Location: [place] CV LAB;  Service: Cardiovascular;  Laterality: N/A;  . LEFT HEART CATH AND CORS/GRAFTS ANGIOGRAPHY N/A 11/22/2019   Procedure: LEFT HEART CATH AND CORS/GRAFTS ANGIOGRAPHY;  Surgeon: [name], MD;  Location: [place] CV LAB;  Service: Cardiovascular;  Laterality: N/A;  . LEFT HEART CATH AND CORS/GRAFTS ANGIOGRAPHY N/A 03/13/2020   Procedure: LEFT HEART CATH AND CORS/GRAFTS ANGIOGRAPHY;  Surgeon: Lorretta Harp, MD;  Location: [place] CV LAB;  Service: Cardiovascular;  Laterality: N/A;  . OSTEOCHONDRAL DEFECT REPAIR/RECONSTRUCTION Left 11/29/2014   Procedure: LEFT ANKLE MEDIAL MALLEOLUS OSTEOTOMY,AUTO GRAFT FROM CALCANEOUS;  OS TIBIA DENORO GRAFTING TALUS;  Surgeon: Wylene Simmer, MD;  Location: [place];  Service: Orthopedics;  Laterality: Left;  . TEE WITHOUT CARDIOVERSION N/A 09/15/2019   Procedure: TRANSESOPHAGEAL ECHOCARDIOGRAM (TEE);  Surgeon: Gaye Pollack, MD;  Location: [place];  Service: Open Heart Surgery;  Laterality: N/A;  . TUBAL LIGATION       OB History   No obstetric history on file.     Family History  Problem Relation Age of Onset  . Asthma Mother   . COPD Mother   . Diabetes Mother   . Macular degeneration Mother   . Congestive Heart Failure Father     Social History   Tobacco Use  . Smoking status: Former Research scientist (life sciences)  . Smokeless tobacco: Never Used  Vaping Use  . Vaping Use: Every day  . Substances: Nicotine  Substance Use Topics  . Alcohol use: No  . Drug use: No    Home Medications Prior to Admission medications   Medication Sig  Start Date End Date Taking? Authorizing Provider  acetaminophen (TYLENOL) 500 MG tablet Take 500-1,000 mg by mouth every 6 (six) hours as needed (for pain.).    [provider]  amoxicillin-clavulanate (AUGMENTIN) 875-125 MG tablet TAKE 1 TABLET BY MOUTH EVERY 12 HOURS FOR 7 DAYS 10/25/20 10/25/21  Jaclynn Major, NP  aspirin EC 81 MG tablet Take 1 tablet (81 mg total) by mouth daily. 09/05/19   Revankar, Reita Cliche, MD  buPROPion (WELLBUTRIN XL) 300 MG 24 hr tablet Take 1 tablet (300 mg total) by mouth every morning. 04/26/17   Geradine Girt, DO  buPROPion (WELLBUTRIN XL) 300 MG 24 hr tablet TAKE 1 TABLET BY MOUTH DAILY 05/13/20 05/13/21  Jaclynn Major, NP  clopidogrel (PLAVIX) 75 MG tablet TAKE ONE TABLET BY MOUTH DAILY 03/25/20 03/25/21  Revankar, Reita Cliche, MD  dapagliflozin propanediol (FARXIGA) 10 MG TABS tablet TAKE 1 TABLET (10 MG) BY MOUTH ONCE DAILY IN THE MORNING FOR 30 DAYS 11/25/20 11/25/21  Jaclynn Major, NP  ferrous sulfate 325 (65 FE) MG tablet Take 1 tablet (325 mg total) by mouth daily with breakfast. 11/25/19  Cheryln Manly, NP  isosorbide mononitrate (IMDUR) 30 MG 24 hr tablet Take 30 mg by mouth daily. 04/15/20   [provider]  isosorbide mononitrate (IMDUR) 30 MG 24 hr tablet TAKE 1 TABLET BY MOUTH ONCE DAILY 04/15/20 04/15/21  Blazing, Venia Carbon, MD  lactulose, encephalopathy, (CHRONULAC) 10 GM/15ML SOLN TAKE 30 MLS (20 GRAM) BY MOUTH 3 TIMES PER DAY FOR 3 DAYS AS NEEDED FOR CONSTIPATION 12/03/20 12/03/21  Jaclynn Major, NP  levothyroxine (SYNTHROID) 50 MCG tablet TAKE 1 TABLET BY MOUTH ONCE DAILY ON AN EMPTY STOMACH 30 MINUTES BEFORE BREAKFAST ON SATURDAY AND SUNDAY 10/28/20 10/28/21  Jaclynn Major, NP  levothyroxine (SYNTHROID) 75 MCG tablet Take 75 mcg by mouth daily before breakfast.    [provider]  levothyroxine (SYNTHROID) 75 MCG tablet TAKE 1 TABLET BY MOUTH ONCE DAILY MONDAY TH[facility]OUGH [facility] 10/28/20 10/28/21  Jaclynn Major, NP  Liraglutide -Weight Management 18 MG/3ML SOPN INJECT '3MG'$  UNDER THE SKIN ONCE DAILY IN THE ABDOMEN, THIGH, OR UPPER ARM 11/22/20 11/22/21  Jaclynn Major, NP  Liraglutide -Weight Management 18 MG/3ML SOPN INJECT 2.'4MG'$  UNDER THE SKIN ONCE DAILY IN THE ABDOMEN, THIGH, OR UPPER ARM FOR 1 WEEK **11/29/20** 11/22/20 11/22/21  Jaclynn Major, NP  Liraglutide -Weight Management 18 MG/3ML SOPN INJECT 1.8 MG UNDER THE SKIN ONCE DAILY IN THE ABDOMEN, THIGH, OR UPPER ARM FOR 1 WEEK 11/22/20 11/22/21  Jaclynn Major, NP  metFORMIN (GLUCOPHAGE) 500 MG tablet Take by mouth 2 (two) times daily with a meal. 02/06/20   [provider]  metoprolol tartrate (LOPRESSOR) 50 MG tablet Take 50 mg by mouth 2 (two) times daily. 04/15/20   [provider]  metoprolol tartrate (LOPRESSOR) 50 MG tablet TAKE 1 TABLET BY MOUTH TWICE DAILY WITH MEALS 11/22/20 11/22/21  Jaclynn Major, NP  nitroGLYCERIN (NITROSTAT) 0.4 MG SL tablet DISSOLVE ONE TABLET UNDER TONGUE EVERY 5 MINUTES AS NEEDED FOR CHEST PAIN 09/02/20 09/02/21  Revankar, Reita Cliche, MD  ondansetron (ZOFRAN) 4 MG tablet Take 1 tablet (4 mg total) by mouth every 8 (eight) hours as needed for nausea or vomiting. 09/19/19   Elgie Collard, PA-C  ondansetron (ZOFRAN) 4 MG tablet TAKE 1 TABLET (4 MG) BY MOUTH TWICE DAILY AS NEEDED FOR NAUSEA 12/05/20 12/05/21  Jaclynn Major, NP  ranolazine (RANEXA) 1000 MG SR tablet TAKE ONE TABLET BY MOUTH TWICE DAILY 03/13/20 03/13/21  Kroeger, Daleen Snook M., PA-C  rOPINIRole (REQUIP) 1 MG tablet Take 1 mg by mouth 3 (three) times daily.     [provider]  rOPINIRole (REQUIP) 1 MG tablet TAKE 1 TABLET BY MOUTH 3 TIMES DAILY 05/13/20 05/13/21  Jaclynn Major, NP  rOPINIRole (REQUIP) 4 MG tablet Take 4 mg by mouth at bedtime.    [provider]  rOPINIRole (REQUIP) 4 MG tablet TAKE 1 TABLET BY MOUTH 1 - 3 HOURS BEFORE BEDTIME 05/13/20 05/13/21  Jaclynn Major, NP   rosuvastatin (CRESTOR) 40 MG tablet TAKE ONE TABLET BY MOUTH AT BEDTIME 03/13/20 03/13/21  Kroeger, Lorelee Cover., PA-C  topiramate (TOPAMAX) 100 MG tablet Take 100 mg by mouth 2 (two) times daily.    [provider]  topiramate (TOPAMAX) 100 MG tablet TAKE 1 TABLET BY MOUTH 2 TIMES DAILY 05/13/20 05/13/21  Jaclynn Major, NP  traMADol (ULTRAM) 50 MG tablet Take 1 tablet (50 mg total) by mouth every 12 (twelve) hours as needed (pain). 10/31/19   Gold, Wilder Glade, PA-C  traZODone (DESYREL) 150 MG tablet Take 150 mg by mouth at bedtime.  05/22/19   [provider]  traZODone (DESYREL) 150 MG tablet TAKE 1 TABLET BY MOUTH ONCE DAILY 11/21/19 11/20/20  Jaclynn Major, NP    Allergies    Carbidopa-levodopa, Prednisone, and Venlafaxine  Review of Systems   Review of Systems  Constitutional:       Per HPI, otherwise negative  HENT:       Per HPI, otherwise negative  Respiratory:       Per HPI, otherwise negative  Cardiovascular:       Per HPI, otherwise negative  Gastrointestinal: Negative for vomiting.  Endocrine:       Negative aside from HPI  Genitourinary:       Neg aside from HPI   Musculoskeletal:       Per HPI, otherwise negative  Skin: Negative.   Neurological: Negative for syncope.    Physical Exam Updated Vital Signs BP 103/87   Pulse 71   Temp 98.4 F (36.9 C) (Oral)   Resp (!) 24   Ht '5\' 3"'$  (1.6 m)   Wt 71.2 kg   SpO2 97%   BMI 27.81 kg/m   Physical Exam Vitals and nursing note reviewed.  Constitutional:      General: She is not in acute distress.    Appearance: She is well-developed.  HENT:     Head: Normocephalic and atraumatic.  Eyes:     Conjunctiva/sclera: Conjunctivae normal.  Cardiovascular:     Rate and Rhythm: Normal rate and regular rhythm.  Pulmonary:     Effort: Pulmonary effort is normal. No respiratory distress.     Breath sounds: Normal breath sounds. No stridor.  Abdominal:     General: There is no distension.  Skin:     General: Skin is warm and dry.  Neurological:     Mental Status: She is alert and oriented to person, place, and time.     Cranial Nerves: No cranial nerve deficit.     ED Results / Procedures / Treatments   Labs (all labs ordered are listed, but only abnormal results are displayed) Labs Reviewed  BASIC METABOLIC PANEL - Abnormal; Notable for the following components:      Result Value   Potassium 3.3 (*)    Chloride 115 (*)    CO2 19 (*)    Glucose, Bld 159 (*)    BUN 26 (*)    Creatinine, Ser 1.76 (*)    Calcium 8.6 (*)    GFR, Estimated 33 (*)    All other components within normal limits  CBC - Abnormal; Notable for the following components:   RBC 3.47 (*)    Hemoglobin 10.0 (*)    HCT 31.7 (*)    All other components within normal limits  TROPONIN I (HIGH SENSITIVITY)  TROPONIN I (HIGH SENSITIVITY)    EKG EKG Interpretation  Date/Time:  Sunday December 29 2020 21:05:10 EDT Ventricular Rate:  103 PR Interval:  132 QRS Duration: 91 QT Interval:  345 QTC Calculation: 452 R Axis:   80 Text Interpretation: Sinus tachycardia Borderline repolarization abnormality Confirmed by Carmin Muskrat [phone]) on 12/29/2020 9:45:14 PM   Radiology DG Chest 2 View  Result Date: 12/29/2020 CLINICAL DATA:  Chest pain EXAM: CHEST - 2 VIEW COMPARISON:  03/12/2020 FINDINGS: Prior CABG. Heart and mediastinal contours are within normal limits. No focal opacities or effusions. No acute bony abnormality. IMPRESSION: No active cardiopulmonary disease. Electronically Signed   By: Rolm Baptise M.D.   On: 12/29/2020 21:48    Procedures  Procedures   Medications Ordered in ED Medications - No data to display  ED Course  I have reviewed the triage vital signs and the nursing notes.  Pertinent labs & imaging results that were available during my care of the patient were reviewed by me and considered in my medical decision making (see chart for details).    11:47 PM Patient in no distress.   She has had additional episodes of chest pain, and I have evaluated her in the room while this has occurred.  Rhythm strip with no notable changes.  This adult female with history of CABG, stenting, angina since those procedures now presents with chest pain.  She is awake, alert, speaking clearly, has improved after receiving fentanyl in transport.  Patient's initial EKG, labs are reassuring.  She does not have other complaints such as worsening dyspnea, is no x-ray no physical exam findings concerning for pneumonia, low suspicion for other acute phenomena such as dissection, no evidence for infection bacteremia, sepsis. Patient has received analgesia here, will require additional monitoring, analgesia, repeat evaluation has remaining labs are available.  Patient disposition to be determined. Dr. Betsey Holiday is aware of the patient.  Final Clinical Impression(s) / ED Diagnoses Final diagnoses:  Atypical chest pain     Carmin Muskrat, MD 12/29/20 2349

## 2020-12-29 NOTE — ED Notes (Signed)
Provider at bedside at this time

## 2020-12-29 NOTE — ED Triage Notes (Signed)
Brought via [facility] from home for CP. Developed CP this morning around 8am just watching grandkids when it developed. Took a total of 12 Nitro prior to EMS arrival with no relief. Painh 10/10 prior to EMS interventions and now it is a 2/10

## 2020-12-29 NOTE — ED Notes (Signed)
PROVIDER AT BEDSIDE.

## 2020-12-29 NOTE — ED Notes (Signed)
Patient transported to X-ray 

## 2020-12-30 ENCOUNTER — Encounter (HOSPITAL_COMMUNITY): Payer: Self-pay | Admitting: Internal Medicine

## 2020-12-30 ENCOUNTER — Observation Stay (HOSPITAL_BASED_OUTPATIENT_CLINIC_OR_DEPARTMENT_OTHER): Payer: 59

## 2020-12-30 DIAGNOSIS — I340 Nonrheumatic mitral (valve) insufficiency: Secondary | ICD-10-CM | POA: Diagnosis not present

## 2020-12-30 DIAGNOSIS — I10 Essential (primary) hypertension: Secondary | ICD-10-CM

## 2020-12-30 DIAGNOSIS — R079 Chest pain, unspecified: Secondary | ICD-10-CM | POA: Diagnosis not present

## 2020-12-30 DIAGNOSIS — R0789 Other chest pain: Secondary | ICD-10-CM

## 2020-12-30 DIAGNOSIS — Z951 Presence of aortocoronary bypass graft: Secondary | ICD-10-CM | POA: Diagnosis not present

## 2020-12-30 HISTORY — DX: Other chest pain: R07.89

## 2020-12-30 LAB — BASIC METABOLIC PANEL
Anion gap: 6 (ref 5–15)
BUN: 26 mg/dL — ABNORMAL HIGH (ref 8–23)
CO2: 22 mmol/L (ref 22–32)
Calcium: 8.6 mg/dL — ABNORMAL LOW (ref 8.9–10.3)
Chloride: 114 mmol/L — ABNORMAL HIGH (ref 98–111)
Creatinine, Ser: 1.66 mg/dL — ABNORMAL HIGH (ref 0.44–1.00)
GFR, Estimated: 35 mL/min — ABNORMAL LOW (ref >60)
Glucose, Bld: 133 mg/dL — ABNORMAL HIGH (ref 70–99)
Potassium: 3.5 mmol/L (ref 3.5–5.1)
Sodium: 142 mmol/L (ref 135–145)

## 2020-12-30 LAB — RESP PANEL BY RT-PCR (FLU A&B, COVID) ARPGX2
Influenza A by PCR: NEGATIVE
Influenza B by PCR: NEGATIVE
SARS Coronavirus 2 by RT PCR: NEGATIVE

## 2020-12-30 LAB — CBC
HCT: 29.6 % — ABNORMAL LOW (ref 36.0–46.0)
Hemoglobin: 9.4 g/dL — ABNORMAL LOW (ref 12.0–15.0)
MCH: 29.6 pg (ref 26.0–34.0)
MCHC: 31.8 g/dL (ref 30.0–36.0)
MCV: 93.1 fL (ref 80.0–100.0)
Platelets: 214 10*3/uL (ref 150–400)
RBC: 3.18 MIL/uL — ABNORMAL LOW (ref 3.87–5.11)
RDW: 14.2 % (ref 11.5–15.5)
WBC: 5.1 10*3/uL (ref 4.0–10.5)
nRBC: 0 % (ref 0.0–0.2)

## 2020-12-30 LAB — GLUCOSE, CAPILLARY
Glucose-Capillary: 104 mg/dL — ABNORMAL HIGH (ref 70–99)
Glucose-Capillary: 90 mg/dL (ref 70–99)

## 2020-12-30 LAB — ECHOCARDIOGRAM COMPLETE
Area-P 1/2: 2.87 cm2
Height: 63 in
S' Lateral: 3 cm
Weight: 2512 oz

## 2020-12-30 LAB — TROPONIN I (HIGH SENSITIVITY)
Troponin I (High Sensitivity): 19 ng/L — ABNORMAL HIGH (ref <18)
Troponin I (High Sensitivity): 72 ng/L — ABNORMAL HIGH (ref <18)
Troponin I (High Sensitivity): 74 ng/L — ABNORMAL HIGH (ref <18)

## 2020-12-30 LAB — CBG MONITORING, ED: Glucose-Capillary: 123 mg/dL — ABNORMAL HIGH (ref 70–99)

## 2020-12-30 LAB — HEPARIN LEVEL (UNFRACTIONATED): Heparin Unfractionated: 0.59 IU/mL (ref 0.30–0.70)

## 2020-12-30 LAB — D-DIMER, QUANTITATIVE: D-Dimer, Quant: 0.52 ug/mL-FEU — ABNORMAL HIGH (ref 0.00–0.50)

## 2020-12-30 MED ORDER — RANOLAZINE ER 500 MG PO TB12
1000.0000 mg | ORAL_TABLET | Freq: Two times a day (BID) | ORAL | Status: DC
  Administered 2020-12-30 – 2021-01-01 (×5): 1000 mg via ORAL
  Filled 2020-12-30 (×7): qty 2

## 2020-12-30 MED ORDER — ASPIRIN EC 81 MG PO TBEC
81.0000 mg | DELAYED_RELEASE_TABLET | Freq: Every day | ORAL | Status: DC
  Administered 2020-12-30 – 2021-01-01 (×3): 81 mg via ORAL
  Filled 2020-12-30 (×3): qty 1

## 2020-12-30 MED ORDER — INSULIN ASPART 100 UNIT/ML ~~LOC~~ SOLN: 0.0000 [IU] | Freq: Three times a day (TID) | SUBCUTANEOUS | Status: DC

## 2020-12-30 MED ORDER — MORPHINE SULFATE (PF) 4 MG/ML IV SOLN
4.0000 mg | Freq: Once | INTRAVENOUS | Status: AC
  Administered 2020-12-30: 4 mg via INTRAVENOUS
  Filled 2020-12-30: qty 1

## 2020-12-30 MED ORDER — LEVOTHYROXINE SODIUM 50 MCG PO TABS: 50.0000 ug | ORAL_TABLET | ORAL | Status: DC

## 2020-12-30 MED ORDER — LEVOTHYROXINE SODIUM 75 MCG PO TABS
75.0000 ug | ORAL_TABLET | ORAL | Status: DC
  Administered 2020-12-30 – 2021-01-01 (×3): 75 ug via ORAL
  Filled 2020-12-30 (×3): qty 1

## 2020-12-30 MED ORDER — ROSUVASTATIN CALCIUM 20 MG PO TABS
40.0000 mg | ORAL_TABLET | Freq: Every day | ORAL | Status: DC
  Administered 2020-12-30 – 2020-12-31 (×2): 40 mg via ORAL
  Filled 2020-12-30 (×2): qty 2

## 2020-12-30 MED ORDER — HEPARIN (PORCINE) 25000 UT/250ML-% IV SOLN
850.0000 [IU]/h | INTRAVENOUS | Status: DC
  Administered 2020-12-30: 850 [IU]/h via INTRAVENOUS
  Filled 2020-12-30: qty 250

## 2020-12-30 MED ORDER — ENOXAPARIN SODIUM 40 MG/0.4ML ~~LOC~~ SOLN
40.0000 mg | SUBCUTANEOUS | Status: DC
  Administered 2020-12-30: 40 mg via SUBCUTANEOUS
  Filled 2020-12-30: qty 0.4

## 2020-12-30 MED ORDER — SODIUM CHLORIDE 0.9 % IV SOLN: INTRAVENOUS | Status: DC

## 2020-12-30 MED ORDER — ONDANSETRON HCL 4 MG/2ML IJ SOLN
4.0000 mg | Freq: Once | INTRAMUSCULAR | Status: AC
  Administered 2020-12-30: 4 mg via INTRAVENOUS
  Filled 2020-12-30: qty 2

## 2020-12-30 MED ORDER — ACETAMINOPHEN 325 MG PO TABS
650.0000 mg | ORAL_TABLET | Freq: Four times a day (QID) | ORAL | Status: DC | PRN
  Filled 2020-12-30: qty 2

## 2020-12-30 MED ORDER — ASPIRIN 81 MG PO CHEW: 324.0000 mg | CHEWABLE_TABLET | Freq: Once | ORAL | Status: DC

## 2020-12-30 MED ORDER — BUPROPION HCL ER (XL) 150 MG PO TB24
300.0000 mg | ORAL_TABLET | Freq: Every day | ORAL | Status: DC
  Administered 2020-12-30 – 2021-01-01 (×3): 300 mg via ORAL
  Filled 2020-12-30 (×3): qty 2

## 2020-12-30 MED ORDER — TRAZODONE HCL 50 MG PO TABS
150.0000 mg | ORAL_TABLET | Freq: Every day | ORAL | Status: DC
  Administered 2020-12-30: 150 mg via ORAL
  Filled 2020-12-30: qty 1
  Filled 2020-12-30: qty 3

## 2020-12-30 MED ORDER — DIPHENHYDRAMINE HCL 50 MG/ML IJ SOLN
25.0000 mg | Freq: Once | INTRAMUSCULAR | Status: AC
  Administered 2020-12-30: 25 mg via INTRAVENOUS
  Filled 2020-12-30: qty 1

## 2020-12-30 MED ORDER — LACTULOSE 10 GM/15ML PO SOLN
30.0000 g | Freq: Three times a day (TID) | ORAL | Status: DC | PRN
  Filled 2020-12-30: qty 45

## 2020-12-30 MED ORDER — FERROUS SULFATE 325 (65 FE) MG PO TABS
325.0000 mg | ORAL_TABLET | Freq: Every day | ORAL | Status: DC
  Administered 2020-12-30 – 2021-01-01 (×3): 325 mg via ORAL
  Filled 2020-12-30 (×3): qty 1

## 2020-12-30 MED ORDER — TOPIRAMATE 100 MG PO TABS
100.0000 mg | ORAL_TABLET | Freq: Two times a day (BID) | ORAL | Status: DC
  Administered 2020-12-30 – 2021-01-01 (×5): 100 mg via ORAL
  Filled 2020-12-30: qty 1
  Filled 2020-12-30: qty 4
  Filled 2020-12-30: qty 1
  Filled 2020-12-30: qty 4
  Filled 2020-12-30 (×2): qty 1
  Filled 2020-12-30: qty 4

## 2020-12-30 MED ORDER — CLOPIDOGREL BISULFATE 75 MG PO TABS
75.0000 mg | ORAL_TABLET | Freq: Every day | ORAL | Status: DC
  Administered 2020-12-30 – 2021-01-01 (×3): 75 mg via ORAL
  Filled 2020-12-30 (×3): qty 1

## 2020-12-30 MED ORDER — ROPINIROLE HCL 1 MG PO TABS
1.0000 mg | ORAL_TABLET | Freq: Three times a day (TID) | ORAL | Status: DC
  Filled 2020-12-30: qty 1

## 2020-12-30 MED ORDER — TRAMADOL HCL 50 MG PO TABS
50.0000 mg | ORAL_TABLET | Freq: Two times a day (BID) | ORAL | Status: DC | PRN
Start: 2020-12-30 — End: 2021-01-01
  Administered 2020-12-30: 50 mg via ORAL
  Filled 2020-12-30: qty 1

## 2020-12-30 MED ORDER — METOPROLOL TARTRATE 50 MG PO TABS
50.0000 mg | ORAL_TABLET | Freq: Two times a day (BID) | ORAL | Status: DC
  Administered 2020-12-30 – 2021-01-01 (×4): 50 mg via ORAL
  Filled 2020-12-30 (×3): qty 1
  Filled 2020-12-30: qty 2

## 2020-12-30 MED ORDER — ACETAMINOPHEN 650 MG RE SUPP: 650.0000 mg | Freq: Four times a day (QID) | RECTAL | Status: DC | PRN

## 2020-12-30 MED ORDER — ROPINIROLE HCL 0.5 MG PO TABS
1.0000 mg | ORAL_TABLET | Freq: Four times a day (QID) | ORAL | Status: DC
  Administered 2020-12-30 – 2021-01-01 (×8): 1 mg via ORAL
  Filled 2020-12-30: qty 2
  Filled 2020-12-30 (×3): qty 1
  Filled 2020-12-30: qty 2
  Filled 2020-12-30 (×5): qty 1
  Filled 2020-12-30: qty 2

## 2020-12-30 MED ORDER — ROPINIROLE HCL 1 MG PO TABS: 4.0000 mg | ORAL_TABLET | ORAL | Status: DC

## 2020-12-30 MED ORDER — ISOSORBIDE MONONITRATE ER 30 MG PO TB24
30.0000 mg | ORAL_TABLET | Freq: Every day | ORAL | Status: DC
  Administered 2020-12-30 – 2021-01-01 (×3): 30 mg via ORAL
  Filled 2020-12-30 (×3): qty 1

## 2020-12-30 MED ORDER — NITROGLYCERIN 0.4 MG SL SUBL
0.4000 mg | SUBLINGUAL_TABLET | SUBLINGUAL | Status: DC | PRN
  Administered 2020-12-31 (×2): 0.4 mg via SUBLINGUAL
  Filled 2020-12-30: qty 1

## 2020-12-30 MED ORDER — POTASSIUM CHLORIDE CRYS ER 10 MEQ PO TBCR
10.0000 meq | EXTENDED_RELEASE_TABLET | Freq: Once | ORAL | Status: AC
  Administered 2020-12-30: 10 meq via ORAL
  Filled 2020-12-30: qty 1

## 2020-12-30 NOTE — ED Notes (Signed)
Advised by provider to feed pt and then keep her npo after that

## 2020-12-30 NOTE — ED Notes (Signed)
PROVIDER AT BEDSIDE AT THIS TIME

## 2020-12-30 NOTE — ED Notes (Signed)
Attempt to give report x2  

## 2020-12-30 NOTE — H&P (Signed)
History and Physical    Meghan Terry [id number] DOB: 07-15-1961 DOA: 12/29/2020  PCP: Garwin Brothers, MD  Patient coming from: Home.  Chief Complaint: Chest pain.  HPI: Meghan Terry is a 60 y.o. female with history of CAD status post CABG, chronic kidney disease stage III, history of diabetes mellitus patient states at this time she does not take any diabetic medications with history of anemia presents to the ER with complaint of chest pain.  Patient states her daughter just got married 2 days ago and she was taking care of her daughters kids and was exhausted.  She had to take at least 8 doses of sublingual nitroglycerin over the last 24 hours because of substernal chest pressure.  Denies any associated shortness of breath diaphoresis abdominal pain nausea vomiting or diarrhea.  Given the frequent chest pain patient presents to the ER.  ED Course: In the ER patient was chest pain-free.  Chest x-ray was unremarkable EKG was showing sinus tachycardia.  Had mild swelling of the both lower extremities.  High sensitive troponins were 7 and 19.  Otherwise labs show chronic features with creatinine 1.7 hemoglobin of 10.  Covid test is negative.  Patient presently chest pain-free.  Review of Systems: As per HPI, rest all negative.   Past Medical History:  Diagnosis Date  . Angina pectoris ([place]) 11/17/2019  . Anxiety   . CKD (chronic kidney disease) stage 3b, GFR 30-59 ml/min 12/03/2019  . Coronary artery disease   . Coronary artery disease involving native coronary artery of native heart with angina pectoris ([place]) 10/03/2019  . Depression 04/25/2017  . Diabetes mellitus due to underlying condition with unspecified complications ([place]) [id number]  . Essential hypertension 09/05/2019  . Ex-smoker 09/05/2019  . Family history of coronary artery disease 09/05/2019  . Hyperlipidemia with target LDL less than 70 11/24/2019  . Hypertension   . Hypothyroid   . Migraine 04/25/2017  . 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 Presence of drug coated stent in right coronary artery 11/24/2019  . Pyelonephritis 05/20/2016  . RLS (restless legs syndrome)   . 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 Thyroid disease     Past Surgical History:  Procedure Laterality Date  . ABDOMINAL HYSTERECTOMY    . APPENDECTOMY    . CARDIAC CATHETERIZATION  11/22/2019  . CHOLECYSTECTOMY    . COLONOSCOPY    . CORONARY ARTERY BYPASS GRAFT N/A 09/15/2019   Procedure: CORONARY ARTERY BYPASS GRAFTING (CABG) times three on pump using left internal mammary artery and right and left greater saphenous veins harvested endoscopically;  Surgeon: Gaye Pollack, MD;  Location: [facility];  Service: Open Heart Surgery;  Laterality: N/A;  . CORONARY STENT INTERVENTION N/A 11/23/2019   Procedure: CORONARY STENT INTERVENTION;  Surgeon: [name], MD;  Location: [facility];  Service: Cardiovascular;  Laterality: N/A;  . KNEE ARTHROSCOPY    . LEFT HEART CATH AND CORONARY ANGIOGRAPHY N/A 09/14/2019   Procedure: LEFT HEART CATH AND CORONARY ANGIOGRAPHY;  Surgeon: [name], MD;  Location: [facility];  Service: Cardiovascular;  Laterality: N/A;  . LEFT HEART CATH AND CORS/GRAFTS ANGIOGRAPHY N/A 11/22/2019   Procedure: LEFT HEART CATH AND CORS/GRAFTS ANGIOGRAPHY;  Surgeon: [name], MD;  Location: [facility];  Service: Cardiovascular;  Laterality: N/A;  . LEFT HEART CATH AND CORS/GRAFTS ANGIOGRAPHY N/A 03/13/2020   Procedure: LEFT HEART CATH AND CORS/GRAFTS ANGIOGRAPHY;  Surgeon: Lorretta Harp, MD;  Location: [facility];  Service: Cardiovascular;  Laterality: N/A;  . OSTEOCHONDRAL DEFECT REPAIR/RECONSTRUCTION Left 11/29/2014   Procedure: LEFT ANKLE MEDIAL MALLEOLUS OSTEOTOMY,AUTO GRAFT FROM CALCANEOUS;  OS TIBIA DENORO GRAFTING TALUS;  Surgeon: Wylene Simmer, MD;  Location: [facility];  Service: Orthopedics;  Laterality: Left;  . TEE WITHOUT CARDIOVERSION N/A 09/15/2019   Procedure: TRANSESOPHAGEAL  ECHOCARDIOGRAM (TEE);  Surgeon: Gaye Pollack, MD;  Location: [facility];  Service: Open Heart Surgery;  Laterality: N/A;  . TUBAL LIGATION       reports that she has quit smoking. She has never used smokeless tobacco. She reports that she does not drink alcohol and does not use drugs.  Allergies  Allergen Reactions  . Carbidopa-Levodopa Other (See Comments)    Developed tics while taking   . Prednisone Other (See Comments)    Turns red all over   . Venlafaxine Other (See Comments)    Developed tics while taking     Family History  Problem Relation Age of Onset  . Asthma Mother   . COPD Mother   . Diabetes Mother   . Macular degeneration Mother   . Congestive Heart Failure Father     Prior to Admission medications   Medication Sig Start Date End Date Taking? Authorizing Provider  acetaminophen (TYLENOL) 500 MG tablet Take 500-1,000 mg by mouth every 6 (six) hours as needed (for pain.).   Yes [provider]  aspirin EC 81 MG tablet Take 1 tablet (81 mg total) by mouth daily. 09/05/19  Yes Revankar, Reita Cliche, MD  buPROPion (WELLBUTRIN XL) 300 MG 24 hr tablet TAKE 1 TABLET BY MOUTH DAILY Patient taking differently: Take 300 mg by mouth daily. 05/13/20 05/13/21 Yes Mawoneke, Geni Bers, NP  clopidogrel (PLAVIX) 75 MG tablet TAKE ONE TABLET BY MOUTH DAILY Patient taking differently: Take 75 mg by mouth daily. 03/25/20 03/25/21 Yes Revankar, Reita Cliche, MD  ferrous sulfate 325 (65 FE) MG tablet Take 1 tablet (325 mg total) by mouth daily with breakfast. 11/25/19  Yes Reino Bellis B, NP  isosorbide mononitrate (IMDUR) 30 MG 24 hr tablet TAKE 1 TABLET BY MOUTH ONCE DAILY Patient taking differently: Take 30 mg by mouth daily. 04/15/20 04/15/21 Yes Blazing, Venia Carbon, MD  lactulose, encephalopathy, (CHRONULAC) 10 GM/15ML SOLN TAKE 30 MLS (20 GRAM) BY MOUTH 3 TIMES PER DAY FOR 3 DAYS AS NEEDED FOR CONSTIPATION Patient taking differently: Take 30 g by mouth 3 (three) times daily as needed  (constipation). 12/03/20 12/03/21 Yes Mawoneke, Geni Bers, NP  levothyroxine (SYNTHROID) 50 MCG tablet TAKE 1 TABLET BY MOUTH ONCE DAILY ON AN EMPTY STOMACH 30 MINUTES BEFORE BREAKFAST ON SATURDAY AND SUNDAY Patient taking differently: Take 50 mcg by mouth See admin instructions. Saturday and sunday 10/28/20 10/28/21 Yes Mawoneke, Geni Bers, NP  levothyroxine (SYNTHROID) 75 MCG tablet TAKE 1 TABLET BY MOUTH ONCE DAILY [place] Patient taking differently: Take 75 mcg by mouth See admin instructions. Monday- friday 10/28/20 10/28/21 Yes Mawoneke, Geni Bers, NP  metoprolol tartrate (LOPRESSOR) 50 MG tablet TAKE 1 TABLET BY MOUTH TWICE DAILY WITH MEALS Patient taking differently: Take 50 mg by mouth 2 (two) times daily with a meal. 11/22/20 11/22/21 Yes Mawoneke, Geni Bers, NP  nitroGLYCERIN (NITROSTAT) 0.4 MG SL tablet DISSOLVE ONE TABLET UNDER TONGUE EVERY 5 MINUTES AS NEEDED FOR CHEST PAIN Patient taking differently: Place 0.4 mg under the tongue every 5 (five) minutes as needed for chest pain. 09/02/20 09/02/21 Yes Revankar, Reita Cliche, MD  ondansetron (ZOFRAN) 4 MG tablet TAKE 1  TABLET (4 MG) BY MOUTH TWICE DAILY AS NEEDED FOR NAUSEA Patient taking differently: Take 4 mg by mouth 2 (two) times daily as needed for vomiting or nausea. 12/05/20 12/05/21 Yes Mawoneke, Geni Bers, NP  ranolazine (RANEXA) 1000 MG SR tablet TAKE ONE TABLET BY MOUTH TWICE DAILY Patient taking differently: Take 1,000 mg by mouth 2 (two) times daily. 03/13/20 03/13/21 Yes Kroeger, Daleen Snook M., PA-C  rOPINIRole (REQUIP) 1 MG tablet TAKE 1 TABLET BY MOUTH 3 TIMES DAILY Patient taking differently: Take 1 mg by mouth 3 (three) times daily. 05/13/20 05/13/21 Yes Mawoneke, Geni Bers, NP  rOPINIRole (REQUIP) 4 MG tablet TAKE 1 TABLET BY MOUTH 1 - 3 HOURS BEFORE BEDTIME Patient taking differently: Take 4 mg by mouth See admin instructions. 1-3 hours prior to bedtime 05/13/20 05/13/21 Yes Mawoneke, Geni Bers, NP  rosuvastatin (CRESTOR) 40  MG tablet TAKE ONE TABLET BY MOUTH AT BEDTIME Patient taking differently: Take 40 mg by mouth at bedtime. 03/13/20 03/13/21 Yes Kroeger, Daleen Snook M., PA-C  topiramate (TOPAMAX) 100 MG tablet TAKE 1 TABLET BY MOUTH 2 TIMES DAILY Patient taking differently: Take 100 mg by mouth 2 (two) times daily. 05/13/20 05/13/21 Yes Mawoneke, Geni Bers, NP  traMADol (ULTRAM) 50 MG tablet Take 1 tablet (50 mg total) by mouth every 12 (twelve) hours as needed (pain). 10/31/19  Yes Gold, Wayne E, PA-C  traZODone (DESYREL) 150 MG tablet TAKE 1 TABLET BY MOUTH ONCE DAILY Patient taking differently: Take 150 mg by mouth daily. 11/21/19 11/20/20 Yes Jaclynn Major, NP  amoxicillin-clavulanate (AUGMENTIN) 875-125 MG tablet TAKE 1 TABLET BY MOUTH EVERY 12 HOURS FOR 7 DAYS Patient not taking: No sig reported 10/25/20 10/25/21  Jaclynn Major, NP  dapagliflozin propanediol (FARXIGA) 10 MG TABS tablet TAKE 1 TABLET (10 MG) BY MOUTH ONCE DAILY IN THE MORNING FOR 30 DAYS Patient not taking: No sig reported 11/25/20 11/25/21  Jaclynn Major, NP  Liraglutide -Weight Management 18 MG/3ML SOPN INJECT '3MG'$  UNDER THE SKIN ONCE DAILY IN THE ABDOMEN, THIGH, OR UPPER ARM Patient not taking: Reported on 12/30/2020 11/22/20 11/22/21  Jaclynn Major, NP  Liraglutide -Weight Management 18 MG/3ML SOPN INJECT 2.'4MG'$  UNDER THE SKIN ONCE DAILY IN THE ABDOMEN, THIGH, OR UPPER ARM FOR 1 WEEK **11/29/20** Patient not taking: Reported on 12/30/2020 11/22/20 11/22/21  Jaclynn Major, NP  Liraglutide -Weight Management 18 MG/3ML SOPN INJECT 1.8 MG UNDER THE SKIN ONCE DAILY IN THE ABDOMEN, THIGH, OR UPPER ARM FOR 1 WEEK Patient not taking: Reported on 12/30/2020 11/22/20 11/22/21  Jaclynn Major, NP    Physical Exam: Constitutional: Moderately built and nourished. Vitals:   12/30/20 0028 12/30/20 0030 12/30/20 0145 12/30/20 0211  BP:  139/68 115/84   Pulse:  88 79   Resp:  16 14   Temp: 97.6 F (36.4 C)   98.2 F (36.8 C)   TempSrc: Oral   Oral  SpO2:  99% 98%   Weight:      Height:       Eyes: Anicteric no pallor. ENMT: No discharge from the ears eyes nose or mouth. Neck: No mass felt.  No neck rigidity. Respiratory: No rhonchi or crepitations. Cardiovascular: S1-S2 heard. Abdomen: Soft nontender bowel sounds present. Musculoskeletal: No edema. Skin: No rash. Neurologic: Alert awake oriented to time place and person.  Moves all extremities. Psychiatric: Appears normal.  Normal affect.   Labs on Admission: I have personally reviewed following labs and imaging studies  CBC: Recent Labs  Lab 12/29/20 2116  WBC 6.5  HGB 10.0*  HCT 31.7*  MCV 91.4  PLT [id number]   Basic Metabolic Panel: Recent Labs  Lab 12/29/20 2116  NA 139  K 3.3*  CL 115*  CO2 19*  GLUCOSE 159*  BUN 26*  CREATININE 1.76*  CALCIUM 8.6*   GFR: Estimated Creatinine Clearance: 31.7 mL/min (A) (by C-G formula based on SCr of 1.76 mg/dL (H)). Liver Function Tests: No results for input(s): AST, ALT, ALKPHOS, BILITOT, PROT, ALBUMIN in the last 168 hours. No results for input(s): LIPASE, AMYLASE in the last 168 hours. No results for input(s): AMMONIA in the last 168 hours. Coagulation Profile: No results for input(s): INR, PROTIME in the last 168 hours. Cardiac Enzymes: No results for input(s): CKTOTAL, CKMB, CKMBINDEX, TROPONINI in the last 168 hours. BNP (last 3 results) No results for input(s): PROBNP in the last 8760 hours. HbA1C: No results for input(s): HGBA1C in the last 72 hours. CBG: No results for input(s): GLUCAP in the last 168 hours. Lipid Profile: No results for input(s): CHOL, HDL, LDLCALC, TRIG, CHOLHDL, LDLDIRECT in the last 72 hours. Thyroid Function Tests: No results for input(s): TSH, T4TOTAL, FREET4, T3FREE, THYROIDAB in the last 72 hours. Anemia Panel: No results for input(s): VITAMINB12, FOLATE, FERRITIN, TIBC, IRON, RETICCTPCT in the last 72 hours. Urine analysis:    Component Value Date/Time    COLORURINE STRAW (A) 09/14/2019 0005   APPEARANCEUR CLEAR 09/14/2019 0005   LABSPEC 1.008 09/14/2019 0005   PHURINE 7.0 09/14/2019 0005   GLUCOSEU NEGATIVE 09/14/2019 0005   HGBUR NEGATIVE 09/14/2019 0005   BILIRUBINUR NEGATIVE 09/14/2019 0005   KETONESUR NEGATIVE 09/14/2019 0005   PROTEINUR NEGATIVE 09/14/2019 0005   NITRITE NEGATIVE 09/14/2019 0005   LEUKOCYTESUR TRACE (A) 09/14/2019 0005   Sepsis Labs: '@LABRCNTIP'$ (procalcitonin:4,lacticidven:4) ) Recent Results (from the past 240 hour(s))  Resp Panel by RT-PCR (Flu A&B, Covid) Nasopharyngeal Swab     Status: None   Collection Time: 12/30/20  1:14 AM   Specimen: Nasopharyngeal Swab; Nasopharyngeal(NP) swabs in vial transport medium  Result Value Ref Range Status   SARS Coronavirus 2 by RT PCR NEGATIVE NEGATIVE Final    Comment: (NOTE) SARS-CoV-2 target nucleic acids are NOT DETECTED.  The SARS-CoV-2 RNA is generally detectable in upper respiratory specimens during the acute phase of infection. The lowest concentration of SARS-CoV-2 viral copies this assay can detect is 138 copies/mL. A negative result does not preclude SARS-Cov-2 infection and should not be used as the sole basis for treatment or other patient management decisions. A negative result may occur with  improper specimen collection/handling, submission of specimen other than nasopharyngeal swab, presence of viral mutation(s) within the areas targeted by this assay, and inadequate number of viral copies(<138 copies/mL). A negative result must be combined with clinical observations, patient history, and epidemiological information. The expected result is Negative.  Fact Sheet for Patients:  EntrepreneurPulse.com.au  Fact Sheet for Healthcare Providers:  IncredibleEmployment.be  This test is no t yet approved or cleared by the [place] FDA and  has been authorized for detection and/or diagnosis of SARS-CoV-2 by FDA  under an Emergency Use Authorization (EUA). This EUA will remain  in effect (meaning this test can be used) for the duration of the COVID-19 declaration under Section 564(b)(1) of the Act, 21 U.S.C.section 360bbb-3(b)(1), unless the authorization is terminated  or revoked sooner.       Influenza A by PCR NEGATIVE NEGATIVE Final   Influenza B by PCR NEGATIVE NEGATIVE Final    Comment: (NOTE) The Xpert Xpress SARS-CoV-2/FLU/RSV plus assay is intended as  an aid in the diagnosis of influenza from Nasopharyngeal swab specimens and should not be used as a sole basis for treatment. Nasal washings and aspirates are unacceptable for Xpert Xpress SARS-CoV-2/FLU/RSV testing.  Fact Sheet for Patients: EntrepreneurPulse.com.au  Fact Sheet for Healthcare Providers: IncredibleEmployment.be  This test is not yet approved or cleared by the [place] FDA and has been authorized for detection and/or diagnosis of SARS-CoV-2 by FDA under an Emergency Use Authorization (EUA). This EUA will remain in effect (meaning this test can be used) for the duration of the COVID-19 declaration under Section 564(b)(1) of the Act, 21 U.S.C. section 360bbb-3(b)(1), unless the authorization is terminated or revoked.  Performed at [facility], [place] [address] [place], [place] [address]      Radiological Exams on Admission: DG Chest 2 View  Result Date: 12/29/2020 CLINICAL DATA:  Chest pain EXAM: CHEST - 2 VIEW COMPARISON:  03/12/2020 FINDINGS: Prior CABG. Heart and mediastinal contours are within normal limits. No focal opacities or effusions. No acute bony abnormality. IMPRESSION: No active cardiopulmonary disease. Electronically Signed   By: Rolm Baptise M.D.   On: 12/29/2020 21:48    EKG: Independently reviewed.  Sinus tachycardia.  Assessment/Plan Principal Problem:   Chest pain Active Problems:   Essential hypertension   Diabetes mellitus due to underlying  condition with unspecified complications ([facility])   S/P CABG x 3   CKD (chronic kidney disease) stage 3b, GFR 30-59 ml/min    1. Chest pain with history of CAD status post CABG concerning for angina.  We will cycle cardiac markers continue aspirin Plavix statins Ranexa and Imdur beta-blockers.  Consult cardiology. 2. History of diabetes/prediabetes not taking any antidiabetic medication at this time as per the patient.  Closely follow metabolic panel. 3. Chronic kidney disease stage III creatinine appears to be at baseline. 4. Mild hypokalemia replace and recheck. 5. Chronic anemia likely from renal disease follow CBC. 6. Hypothyroidism on Synthroid.   DVT prophylaxis: Lovenox. Code Status: Full code. Family Communication: Discussed with patient. Disposition Plan: Home. Consults called: Cardiology. Admission status: Observation.   [name] MD [facility] Pager [phone].  If 7PM-7AM, please contact night-coverage [email or web address] Password [email or web address]  12/30/2020, 3:15 AM

## 2020-12-30 NOTE — ED Notes (Signed)
Primary RN Kennyth Lose made aware of troponin results. Dr. Dwyane Dee paged.

## 2020-12-30 NOTE — ED Notes (Signed)
Ambulated to restroom with steady gait at this time

## 2020-12-30 NOTE — Progress Notes (Signed)
ANTICOAGULATION CONSULT NOTE - Follow Up Consult  Pharmacy Consult for heparin Indication: chest pain/ACS  Labs: Recent Labs    12/29/20 2116 12/29/20 2337 12/30/20 0312 12/30/20 0654 12/30/20 2237  HGB 10.0*  --  9.4*  --   --   HCT 31.7*  --  29.6*  --   --   PLT 222  --  214  --   --   HEPARINUNFRC  --   --   --   --  0.59  CREATININE 1.76*  --  1.66*  --   --   TROPONINIHS 7 19* 72* 74*  --     Assessment/Plan:  60yo female therapeutic on heparin with initial dosing for CP. Will continue gtt at current rate of 850 units/hr and confirm stable with am labs.   Wynona Neat, PharmD, BCPS  12/30/2020,11:46 PM

## 2020-12-30 NOTE — ED Notes (Signed)
Pt complaining of irritation at IV site on right arm, new IV placed in left arm and old IV removed.  Pt also complaining of itching on chest around cardiac monitor stickers and on arms where IVs are taped down. Provider paged and ordered benadryl   No redness or hives noted on pt where she is complaining of itching. Denies feeling light her throat is closing, and denies shortness of breath.

## 2020-12-30 NOTE — ED Notes (Signed)
Pt is asking can she take her home med ropinirole. Provider asked at this time if that would be ok

## 2020-12-30 NOTE — ED Notes (Signed)
Attempt x 1 to call report

## 2020-12-30 NOTE — Consult Note (Addendum)
Cardiology Consultation:   Patient ID: Meghan Terry; [id number]; [birth year]   Admit date: 12/29/2020 Date of Consult: 12/30/2020  Primary Care Provider: Garwin Brothers, MD Primary Cardiologist: Jenean Lindau, MD   Patient Profile:   Meghan Terry is a 60 y.o. female with a hx of CAD s/p CABG 08/2019 (LIMA to LAD, SVG to distal OM and SVG to PDA with early graft failure of the SVG to PDA), PCI to native RCA 09/2019, HTN, HLD, CKD stage III and DM2 who is being seen today for the evaluation of chest pain at the request of Dr. Hal Hope.  History of Present Illness:   Meghan Terry is a 60yo F with a hx as stated above who presented to [facility] 12/29/20 with chest pain which began approximately 8am on Sunday morning. She stated that she was already awake for the day and was in her house when she began having "crushing, severe" chest pain. Over the course of the day, she took 8 SL NTG tablets which wouls help temporarily however the pain would return. Her husband called EMS around 9pm for transport to the ED for further evaluation. She states that she is currently chest pain free.She states that her symptoms are similar to her prior episodes which required CABG and stent placement.  In the ED, EKG with ST and no acute ST/T wave changes. HsT found to be 7>19>>72>>74. CXR with no acute changes. All other labs appear to be WNL. On assessment today, she states that her symptoms were exacerbated with more physical movements and would be relieved with rest. She denies diaphoresis, N/V, dizziness or syncope. She has had some mild SOB and states   She has a hx of complicated CAD with CABG x3 08/2019 with early graft failure with SVG to PDA requiring PCI to native RCA 09/2019 with recommendations for medical management of LCx disease. She presented once again 02/2020 with chest pain while at work. EKG was non-ischemic and troponin levels remained negative. Decision made to pursue a LHC which showed patent stent in SVG to PDA,  patent mid and distal RCA and PLA stents were patent, SVG to OM branch showed moderately high-grade stenosis with small OM branch distal to the graft showing a new occlusion since last cath which was too small for PCI, and patent LIMA. She was recommended for medical management. Her Ranexa was increased to '1000mg'$  BID and she was continued on ASA, Plavix, and statin therapies.   In follow up with Dr. Lennox Pippins 05/07/2020, she reported fairly chronic chest pain>>reporting that it remained constant. She was exercising on a regular basis, which did not change her symptoms. She sought out a second opinion at [facility] 03/2020 at which time she was also recommended for continued medical therapy.    Past Medical History:  Diagnosis Date  . Angina pectoris ([place]) 11/17/2019  . Anxiety   . CKD (chronic kidney disease) stage 3b, GFR 30-59 ml/min 12/03/2019  . Coronary artery disease   . Coronary artery disease involving native coronary artery of native heart with angina pectoris ([place]) 10/03/2019  . Depression 04/25/2017  . Diabetes mellitus due to underlying condition with unspecified complications ([place]) [id number]  . Essential hypertension 09/05/2019  . Ex-smoker 09/05/2019  . Family history of coronary artery disease 09/05/2019  . Hyperlipidemia with target LDL less than 70 11/24/2019  . Hypertension   . Hypothyroid   . Migraine 04/25/2017  . 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 Presence of drug coated stent in right coronary  artery 11/24/2019  . Pyelonephritis 05/20/2016  . RLS (restless legs syndrome)   . 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 Thyroid disease     Past Surgical History:  Procedure Laterality Date  . ABDOMINAL HYSTERECTOMY    . APPENDECTOMY    . CARDIAC CATHETERIZATION  11/22/2019  . CHOLECYSTECTOMY    . COLONOSCOPY    . CORONARY ARTERY BYPASS GRAFT N/A 09/15/2019   Procedure: CORONARY ARTERY BYPASS GRAFTING (CABG) times three on pump using left internal mammary artery and right and left greater saphenous veins  harvested endoscopically;  Surgeon: Gaye Pollack, MD;  Location: [place] OR;  Service: Open Heart Surgery;  Laterality: N/A;  . CORONARY STENT INTERVENTION N/A 11/23/2019   Procedure: CORONARY STENT INTERVENTION;  Surgeon: [name], MD;  Location: [place] [facility];  Service: Cardiovascular;  Laterality: N/A;  . KNEE ARTHROSCOPY    . LEFT HEART CATH AND CORONARY ANGIOGRAPHY N/A 09/14/2019   Procedure: LEFT HEART CATH AND CORONARY ANGIOGRAPHY;  Surgeon: [name], MD;  Location: [place] CV LAB;  Service: Cardiovascular;  Laterality: N/A;  . LEFT HEART CATH AND CORS/GRAFTS ANGIOGRAPHY N/A 11/22/2019   Procedure: LEFT HEART CATH AND CORS/GRAFTS ANGIOGRAPHY;  Surgeon: [name], MD;  Location: [place] [facility];  Service: Cardiovascular;  Laterality: N/A;  . LEFT HEART CATH AND CORS/GRAFTS ANGIOGRAPHY N/A 03/13/2020   Procedure: LEFT HEART CATH AND CORS/GRAFTS ANGIOGRAPHY;  Surgeon: Lorretta Harp, MD;  Location: [place] [facility];  Service: Cardiovascular;  Laterality: N/A;  . OSTEOCHONDRAL DEFECT REPAIR/RECONSTRUCTION Left 11/29/2014   Procedure: LEFT ANKLE MEDIAL MALLEOLUS OSTEOTOMY,AUTO GRAFT FROM CALCANEOUS;  OS TIBIA DENORO GRAFTING TALUS;  Surgeon: Wylene Simmer, MD;  Location: [place];  Service: Orthopedics;  Laterality: Left;  . TEE WITHOUT CARDIOVERSION N/A 09/15/2019   Procedure: TRANSESOPHAGEAL ECHOCARDIOGRAM (TEE);  Surgeon: Gaye Pollack, MD;  Location: [place];  Service: Open Heart Surgery;  Laterality: N/A;  . TUBAL LIGATION       Prior to Admission medications   Medication Sig Start Date End Date Taking? Authorizing Provider  acetaminophen (TYLENOL) 500 MG tablet Take 500-1,000 mg by mouth every 6 (six) hours as needed (for pain.).   Yes [provider]  aspirin EC 81 MG tablet Take 1 tablet (81 mg total) by mouth daily. 09/05/19  Yes Revankar, Reita Cliche, MD  buPROPion (WELLBUTRIN XL) 300 MG 24 hr tablet TAKE 1 TABLET BY MOUTH  DAILY Patient taking differently: Take 300 mg by mouth daily. 05/13/20 05/13/21 Yes Mawoneke, Geni Bers, NP  clopidogrel (PLAVIX) 75 MG tablet TAKE ONE TABLET BY MOUTH DAILY Patient taking differently: Take 75 mg by mouth daily. 03/25/20 03/25/21 Yes Revankar, Reita Cliche, MD  ferrous sulfate 325 (65 FE) MG tablet Take 1 tablet (325 mg total) by mouth daily with breakfast. 11/25/19  Yes Reino Bellis B, NP  isosorbide mononitrate (IMDUR) 30 MG 24 hr tablet TAKE 1 TABLET BY MOUTH ONCE DAILY Patient taking differently: Take 30 mg by mouth daily. 04/15/20 04/15/21 Yes Blazing, Venia Carbon, MD  lactulose, encephalopathy, (CHRONULAC) 10 GM/15ML SOLN TAKE 30 MLS (20 GRAM) BY MOUTH 3 TIMES PER DAY FOR 3 DAYS AS NEEDED FOR CONSTIPATION Patient taking differently: Take 30 g by mouth 3 (three) times daily as needed (constipation). 12/03/20 12/03/21 Yes Mawoneke, Geni Bers, NP  levothyroxine (SYNTHROID) 50 MCG tablet TAKE 1 TABLET BY MOUTH ONCE DAILY ON AN EMPTY STOMACH 30 MINUTES BEFORE BREAKFAST ON SATURDAY AND SUNDAY Patient taking differently: Take 50  mcg by mouth See admin instructions. Saturday and sunday 10/28/20 10/28/21 Yes Mawoneke, Geni Bers, NP  levothyroxine (SYNTHROID) 75 MCG tablet TAKE 1 TABLET BY MOUTH ONCE DAILY [place] Patient taking differently: Take 75 mcg by mouth See admin instructions. Monday- friday 10/28/20 10/28/21 Yes Mawoneke, Geni Bers, NP  metoprolol tartrate (LOPRESSOR) 50 MG tablet TAKE 1 TABLET BY MOUTH TWICE DAILY WITH MEALS Patient taking differently: Take 50 mg by mouth 2 (two) times daily with a meal. 11/22/20 11/22/21 Yes Mawoneke, Geni Bers, NP  nitroGLYCERIN (NITROSTAT) 0.4 MG SL tablet DISSOLVE ONE TABLET UNDER TONGUE EVERY 5 MINUTES AS NEEDED FOR CHEST PAIN Patient taking differently: Place 0.4 mg under the tongue every 5 (five) minutes as needed for chest pain. 09/02/20 09/02/21 Yes Revankar, Reita Cliche, MD  ondansetron (ZOFRAN) 4 MG tablet TAKE 1 TABLET (4 MG) BY MOUTH  TWICE DAILY AS NEEDED FOR NAUSEA Patient taking differently: Take 4 mg by mouth 2 (two) times daily as needed for vomiting or nausea. 12/05/20 12/05/21 Yes Mawoneke, Geni Bers, NP  ranolazine (RANEXA) 1000 MG SR tablet TAKE ONE TABLET BY MOUTH TWICE DAILY Patient taking differently: Take 1,000 mg by mouth 2 (two) times daily. 03/13/20 03/13/21 Yes Kroeger, Daleen Snook M., PA-C  rOPINIRole (REQUIP) 1 MG tablet TAKE 1 TABLET BY MOUTH 3 TIMES DAILY Patient taking differently: Take 1 mg by mouth 3 (three) times daily. 05/13/20 05/13/21 Yes Mawoneke, Geni Bers, NP  rOPINIRole (REQUIP) 4 MG tablet TAKE 1 TABLET BY MOUTH 1 - 3 HOURS BEFORE BEDTIME Patient taking differently: Take 4 mg by mouth See admin instructions. 1-3 hours prior to bedtime 05/13/20 05/13/21 Yes Mawoneke, Geni Bers, NP  rosuvastatin (CRESTOR) 40 MG tablet TAKE ONE TABLET BY MOUTH AT BEDTIME Patient taking differently: Take 40 mg by mouth at bedtime. 03/13/20 03/13/21 Yes Kroeger, Daleen Snook M., PA-C  topiramate (TOPAMAX) 100 MG tablet TAKE 1 TABLET BY MOUTH 2 TIMES DAILY Patient taking differently: Take 100 mg by mouth 2 (two) times daily. 05/13/20 05/13/21 Yes Mawoneke, Geni Bers, NP  traMADol (ULTRAM) 50 MG tablet Take 1 tablet (50 mg total) by mouth every 12 (twelve) hours as needed (pain). 10/31/19  Yes Gold, Wayne E, PA-C  traZODone (DESYREL) 150 MG tablet TAKE 1 TABLET BY MOUTH ONCE DAILY Patient taking differently: Take 150 mg by mouth daily. 11/21/19 11/20/20 Yes Jaclynn Major, NP  amoxicillin-clavulanate (AUGMENTIN) 875-125 MG tablet TAKE 1 TABLET BY MOUTH EVERY 12 HOURS FOR 7 DAYS Patient not taking: No sig reported 10/25/20 10/25/21  Jaclynn Major, NP  dapagliflozin propanediol (FARXIGA) 10 MG TABS tablet TAKE 1 TABLET (10 MG) BY MOUTH ONCE DAILY IN THE MORNING FOR 30 DAYS Patient not taking: No sig reported 11/25/20 11/25/21  Jaclynn Major, NP  Liraglutide -Weight Management 18 MG/3ML SOPN INJECT '3MG'$  UNDER THE SKIN ONCE DAILY  IN THE ABDOMEN, THIGH, OR UPPER ARM Patient not taking: Reported on 12/30/2020 11/22/20 11/22/21  Jaclynn Major, NP  Liraglutide -Weight Management 18 MG/3ML SOPN INJECT 2.'4MG'$  UNDER THE SKIN ONCE DAILY IN THE ABDOMEN, THIGH, OR UPPER ARM FOR 1 WEEK **11/29/20** Patient not taking: Reported on 12/30/2020 11/22/20 11/22/21  Jaclynn Major, NP  Liraglutide -Weight Management 18 MG/3ML SOPN INJECT 1.8 MG UNDER THE SKIN ONCE DAILY IN THE ABDOMEN, THIGH, OR UPPER ARM FOR 1 WEEK Patient not taking: Reported on 12/30/2020 11/22/20 11/22/21  Jaclynn Major, NP    Inpatient Medications: Scheduled Meds: . aspirin  324 mg Oral Once  . aspirin EC  81 mg Oral Daily  . buPROPion  300 mg Oral  Daily  . clopidogrel  75 mg Oral Daily  . enoxaparin (LOVENOX) injection  40 mg Subcutaneous Q24H  . ferrous sulfate  325 mg Oral Q breakfast  . isosorbide mononitrate  30 mg Oral Daily  . [START ON 01/04/2021] levothyroxine  50 mcg Oral Once per day on Sun Sat  . levothyroxine  75 mcg Oral Once per day on Mon Tue Wed Thu Fri  . metoprolol tartrate  50 mg Oral BID WC  . ranolazine  1,000 mg Oral BID  . rOPINIRole  1 mg Oral QID  . rosuvastatin  40 mg Oral QHS  . topiramate  100 mg Oral BID  . traZODone  150 mg Oral Daily   Continuous Infusions:  PRN Meds: acetaminophen **OR** acetaminophen, lactulose, nitroGLYCERIN, traMADol  Allergies:    Allergies  Allergen Reactions  . Carbidopa-Levodopa Other (See Comments)    Developed tics while taking   . Prednisone Other (See Comments)    Turns red all over   . Venlafaxine Other (See Comments)    Developed tics while taking     Social History:   Social History   Socioeconomic History  . Marital status: Married    Spouse name: Not on file  . Number of children: Not on file  . Years of education: Not on file  . Highest education level: Not on file  Occupational History  . Not on file  Tobacco Use  . Smoking status: Former Research scientist (life sciences)  . Smokeless  tobacco: Never Used  Vaping Use  . Vaping Use: Every day  . Substances: Nicotine  Substance and Sexual Activity  . Alcohol use: No  . Drug use: No  . Sexual activity: Not on file    Comment: e-cig  Other Topics Concern  . Not on file  Social History Narrative  . Not on file   Social Determinants of Health   Financial Resource Strain: Not on file  Food Insecurity: Not on file  Transportation Needs: Not on file  Physical Activity: Not on file  Stress: Not on file  Social Connections: Not on file  Intimate Partner Violence: Not on file    Family History:   Family History  Problem Relation Age of Onset  . Asthma Mother   . COPD Mother   . Diabetes Mother   . Macular degeneration Mother   . Congestive Heart Failure Father    Family Status:  Family Status  Relation Name Status  . Mother  Deceased  . Father  Deceased    ROS:  Please see the history of present illness.  All other ROS reviewed and negative.     Physical Exam/Data:   Vitals:   12/30/20 0600 12/30/20 0815 12/30/20 1115 12/30/20 1300  BP: (!) 111/54 116/70 123/65 (!) 102/57  Pulse: 81 75 67 62  Resp: '15 13 15 14  '$ Temp: 97.8 F (36.6 C)     TempSrc: Oral     SpO2: 97% 99% 100% 99%  Weight:      Height:       No intake or output data in the 24 hours ending 12/30/20 1334 Filed Weights   12/29/20 2114  Weight: 71.2 kg   Body mass index is 27.81 kg/m.   General: Well developed, well nourished, NAD Skin: Warm, dry, intact  Head: Normocephalic, atraumatic, sclera non-icteric, no xanthomas, clear, moist mucus membranes. Neck: Negative for carotid bruits. No JVD Lungs: Crackles in right lower lobe. Breathing is unlabored. Cardiovascular: RRR with S1 S2. No murmurs  Abdomen: Soft, non-tender, non-distended. No obvious abdominal masses. Extremities: Mild 1+ edema. Radial pulses 2+ bilaterally Neuro: Alert and oriented. No focal deficits. No facial asymmetry. MAE spontaneously. Psych: Responds to  questions appropriately with normal affect.     EKG:  The EKG was personally reviewed and demonstrates:  12/29/20 ST with HR 103bpm and early repolarization in leads V5-V6  Telemetry:  Telemetry was personally reviewed and demonstrates: 12/30/20 NSR   Relevant CV Studies:   Left heart catheterization 03/13/20:  Ost RCA to Prox RCA lesion is 30% stenosed.  Previously placed Prox RCA to Dist RCA stent (unknown type) is widely patent.  Previously placed RPAV stent (unknown type) is widely patent.  RPDA lesion is 100% stenosed.  Ost Cx to Prox Cx lesion is 100% stenosed.  3rd Mrg lesion is 100% stenosed.  Mid Cx to Dist Cx lesion is 100% stenosed.  Origin to Prox Graft lesion is 75% stenosed.  Mid LAD-1 lesion is 75% stenosed.  Mid LAD-2 lesion is 95% stenosed.  IMPRESSION:Meghan Terry has a patent vein to the PDA which was subtotally occluded at the time of her last cath 4 months ago. I suspect this may been related to spasm. The previously placed stents by Dr. Claiborne Billings in the proximal mid and distal RCA and PLA branches are completely patent. The vein to the circumflex obtuse marginal branch has a moderately high-grade segmental ostial/proximal stenosis although the small circumflex marginal branch distal to the graft insertion has occluded since the last cath. The graft only supplies a small posterior lateral distal branch with retrograde filling. This only supplies a small amount of myocardium. The LIMA is intact. I reviewed the films with Dr. Claiborne Billings we both agree that medical therapy is the best option. I have communicated this to Dr. Marlou Porch as well. A right common femoral angiogram was performed and a Mynx closure device was successfully deployed achieving hemostasis. The patient left the lab in stable condition.   Intervention   LHC 11/23/19:   Ost LAD to Prox LAD lesion is 70% stenosed.  Prox LAD to Mid LAD lesion is 50% stenosed with 50% stenosed side branch in 1st  Diag.  Mid Cx lesion is 99% stenosed.  2nd Mrg-2 lesion is 95% stenosed with 0% stenosed side branch in Lat 2nd Mrg.  2nd Mrg-1 lesion is 95% stenosed.  RPAV lesion is 95% stenosed.  Prox RCA to Mid RCA lesion is 80% stenosed.  Dist RCA lesion is 80% stenosed with 70% stenosed side branch in RPAV.  Origin to Prox Graft lesion is 80% stenosed.  Prox Graft to Mid Graft lesion is 70% stenosed.  A stent was successfully placed.  Post intervention, there is a 0% residual stenosis.  Post intervention, the side branch was reduced to 0% residual stenosis.  Post intervention, there is a 0% residual stenosis.  Post intervention, there is a 0% residual stenosis.  A stent was successfully placed.  A stent was successfully placed.   Difficult but ultimately successful percutaneous coronary intervention to the native dominant RCA in a patient with documented diffuse disease in the vein graft supplying her PDA vessel.    A total of 3 stents were inserted with a 2.0 x 30 mm Resolute Onyx DES stent most distal with tandem overlap with a 2.0 x 12 mm stent to cover the 80 and 95% distal stenoses before the PDA and PLA vessels and of a 2.5 x 30 mm Resolute stent to cover the 80% stenosis in the mid vessel  followed by an additional area of narrowing of 70% just proximal to the acute margin.  Prior to stenting, there was suggestion of distal RCA dissection following very low level inflation with a 2.0 x 12 balloon at the distal 95% stenosis which was successfully treated with stent insertion.  At the completion of the procedure, all areas were reduced to 0%.  There was brisk TIMI-3 flow.  There was no evidence for dissection.  Competitive filling was present down the PDA due to the SVG supplying the mid PDA region.  RECOMMENDATION: Long-term DAPT indefinitely.  Hydration post procedure in this patient with baseline renal insufficiency.  S of lipid-lowering therapy with target LDL less than 70.   Concomitant medical therapy with nitrates, and a blocker, as well as ranolazine which was added yesterday for the patient's distal circumflex disease.  Patient will follow up with Dr. Geraldo Pitter.  Diagnostic Dominance: Right    Intervention     Laboratory Data:  Chemistry Recent Labs  Lab 12/29/20 2116 12/30/20 0312  NA 139 142  K 3.3* 3.5  CL 115* 114*  CO2 19* 22  GLUCOSE 159* 133*  BUN 26* 26*  CREATININE 1.76* 1.66*  CALCIUM 8.6* 8.6*  GFRNONAA 33* 35*  ANIONGAP 5 6    Total Protein  Date Value Ref Range Status  05/07/2020 6.5 6.0 - 8.5 g/dL Final   Albumin  Date Value Ref Range Status  05/07/2020 4.1 3.8 - 4.8 g/dL Final   AST  Date Value Ref Range Status  05/07/2020 16 0 - 40 IU/L Final   ALT  Date Value Ref Range Status  05/07/2020 11 0 - 32 IU/L Final   Alkaline Phosphatase  Date Value Ref Range Status  05/07/2020 81 48 - 121 IU/L Final   Bilirubin Total  Date Value Ref Range Status  05/07/2020 0.2 0.0 - 1.2 mg/dL Final   Hematology Recent Labs  Lab 12/29/20 2116 12/30/20 0312  WBC 6.5 5.1  RBC 3.47* 3.18*  HGB 10.0* 9.4*  HCT 31.7* 29.6*  MCV 91.4 93.1  MCH 28.8 29.6  MCHC 31.5 31.8  RDW 14.0 14.2  PLT 222 214   Cardiac EnzymesNo results for input(s): TROPONINI in the last 168 hours. No results for input(s): TROPIPOC in the last 168 hours.  BNPNo results for input(s): BNP, PROBNP in the last 168 hours.  DDimer  Recent Labs  Lab 12/30/20 0506  DDIMER 0.52*   TSH:  Lab Results  Component Value Date   TSH 0.932 03/12/2020   Lipids: Lab Results  Component Value Date   CHOL 120 05/07/2020   HDL 51 05/07/2020   LDLCALC 41 05/07/2020   TRIG 174 (H) 05/07/2020   CHOLHDL 2.4 05/07/2020   HgbA1c: Lab Results  Component Value Date   HGBA1C 5.0 05/07/2020    Radiology/Studies:  DG Chest 2 View  Result Date: 12/29/2020 CLINICAL DATA:  Chest pain EXAM: CHEST - 2 VIEW COMPARISON:  03/12/2020 FINDINGS: Prior CABG. Heart and  mediastinal contours are within normal limits. No focal opacities or effusions. No acute bony abnormality. IMPRESSION: No active cardiopulmonary disease. Electronically Signed   By: Rolm Baptise M.D.   On: 12/29/2020 21:48    Assessment and Plan:   1. Chest pain with hx of CAD s/p CABG: -Pt presented to [facility] 12/29/20 with chest pain which began approximately 8am on Sunday morning. She stated that she was already awake for the day and was in her house when she began having "crushing, severe" chest pain.  Over the course of the day, she took 8 SL NTG tablets which wouls help temporarily however the pain would return.  -Pt has a complicated CAD hx with CABG x3 08/2019 with early graft failure with SVG to PDA requiring PCI to native RCA 1/202>> recommendations for medical management of LCx disease. She presented once again 02/2020 with chest pain with EKG showing no ischemia and negative HsT.  -Repeat LHC at that time showed patent stent in SVG to PDA, patent mid and distal RCA and PLA stents were patent, SVG to OM branch showed moderately high-grade stenosis with small OM branch distal to the graft showing a new occlusion since last cath which was too small for PCI, and patent LIMA. She was recommended for medical management. -Continue ASA, Plavix, and statin therapies -Continue Ranexa -Recheck echocardiogram  -Plan for [facility] tomorrow after AM labs>>>BMET to assess Cr  -Cardiac catheterization was discussed with the patient fully. The patient understands that risks include but are not limited to stroke (1 in 1000), death (1 in 59), kidney failure [usually temporary] (1 in 500), bleeding (1 in 200), allergic reaction [possibly serious] (1 in 200).  The patient understands and is willing to proceed.     2. HTN:  -Stable, 128/72>>102/57>>123/65 -Continue amlodipine, metoprolol, and Imdur  3. HLD:  -Last LDL 61 11/2019 with LDL goal <70 -Continue rosuvastatin '40mg'$  daily.  4. CKD stage III:  -Cr,  1.66>>>down from 1.76 yesterday  -Baseline appears to be in the   5. Pre-DM2: -HbA1c, 5.0>>>well controlled  -Diet controlled   6. Hypothyroidism: -TSH, 0.932 -Continue synthroid    For questions or updates, please contact [facility] Please consult [email or web address] for contact info under Cardiology/STEMI.   Lyndel Safe NP-C [facility] Pager: [phone] 12/30/2020 1:34 PM  Patient seen and examined and agree with Kathyrn Drown, NP-C.   In brief, the patient is a 60 year old female with a history of CAD s/p CABG 08/2019 (LIMA to LAD, SVG to distal OM and SVG to PDA with early graft failure of the SVG to PDA), PCI to native RCA 09/2019, HTN, HLD, CKD stage III and DM2 who presented with chest pain and mildly elevated troponin for which Cardiology has been consulted.   Patient with known severe CAD s/p CABg x3 in 08/2019 with multiple subsequent PCIs. She has chronic chest pain on medical management but developed acute severe substernal chest pressure prompting her to come to the ED for further evaluation. Trop 7-->74. ECG with no acute ischemic changes. Chest pain currently significantly improved.  Given history and know severe native vessel disease, will plan on coronary angiography as well as continued medical management.  Plan: -NPO for LHC tomorrow; will ensure renal function stable -Check TTE -Okay to eat today; make NPO at MN -Change lovenox to heparin gtt -Continue ASA, statin, plavix -Continue imdur and ranexa as well as nitro SL prn -Continue metop-->will change to long-acting prior to discharge  INFORMED CONSENT: I have reviewed the risks, indications, and alternatives to cardiac catheterization, possible angioplasty, and stenting with the patient. Risks include but are not limited to bleeding, infection, vascular injury, stroke, myocardial infection, arrhythmia, kidney injury, radiation-related injury in the case of prolonged fluoroscopy use, emergency cardiac  surgery, and death. The patient understands the risks of serious complication is 1-2 in [id number] with diagnostic cardiac cath and 1-2% or less with angioplasty/stenting.    Gwyndolyn Kaufman, MD

## 2020-12-30 NOTE — ED Notes (Signed)
Provider at bedside at this time

## 2020-12-30 NOTE — Care Plan (Signed)
This 60 years old female with PMH significant for CAD s/p CABG, chronic kidney disease stage IIIb, history of diabetes, anxiety, depression, hypertension, hypothyroidism, migraine presents in the ER with complaints of chest pain.  Patient reports that her daughter just got married 2 days ago and she was taking care of her daughter's kids and got exhausted so far she has taken 8 nitro sublingual with temporary relief.  She denies any associated shortness of breath, diaphoresis, abdominal pain nausea vomiting or diarrhea.  In the ER EKG shows sinus tachycardia high sensitive troponins were trending up.  Cardiology was consulted. Patient was seen and examined in the room,  denies any chest pain at this time. -------------------------------------------------------------------------------------------------- Cardiology is planning to do left heart cath tomorrow.  Obtain 2D echocardiogram.  Change Lovenox to heparin gtt., continue aspirin, statin, Plavix, Imdur,  Ranexa and sublingual nitro as needed.  Continue metoprolol.

## 2020-12-30 NOTE — ED Provider Notes (Signed)
Patient signed out to me by Dr. Vanita Panda.  Patient seen with chest pain.  She has been experiencing moderate to severe chest pain throughout the day.  She has taken multiple nitroglycerin but reports that it did not help her pain much.  She did have resolution of her pain after she was given fentanyl by EMS.  She had recurrence of pain here in the department and was treated with morphine.  First troponin was negative, second troponin is slightly elevated at 19.  She does have moderate to severe coronary artery disease.  By our chest pain algorithm, patient requires hospitalization for further monitoring of troponins and further work-up.   Orpah Greek, MD 12/30/20 [phone]

## 2020-12-30 NOTE — Progress Notes (Signed)
ANTICOAGULATION CONSULT NOTE - Initial Consult  Pharmacy Consult for IV Heparin Indication: chest pain/ACS  Allergies  Allergen Reactions  . Carbidopa-Levodopa Other (See Comments)    Developed tics while taking   . Prednisone Other (See Comments)    Turns red all over   . Venlafaxine Other (See Comments)    Developed tics while taking     Patient Measurements: Height: '5\' 3"'$  (160 cm) Weight: 71.2 kg (157 lb) IBW/kg (Calculated) : 52.4 Heparin Dosing Weight:  67.2 kg  Vital Signs: Temp: 97.9 F (36.6 C) (04/04 1340) Temp Source: Oral (04/04 1340) BP: 128/72 (04/04 1340) Pulse Rate: 76 (04/04 1340)  Labs: Recent Labs    12/29/20 2116 12/29/20 2337 12/30/20 0312 12/30/20 0654  HGB 10.0*  --  9.4*  --   HCT 31.7*  --  29.6*  --   PLT 222  --  214  --   CREATININE 1.76*  --  1.66*  --   TROPONINIHS 7 19* 72* 74*    Estimated Creatinine Clearance: 33.7 mL/min (A) (by C-G formula based on SCr of 1.66 mg/dL (H)).   Medical History: Past Medical History:  Diagnosis Date  . Angina pectoris ([place]) 11/17/2019  . Anxiety   . CKD (chronic kidney disease) stage 3b, GFR 30-59 ml/min 12/03/2019  . Coronary artery disease   . Coronary artery disease involving native coronary artery of native heart with angina pectoris ([place]) 10/03/2019  . Depression 04/25/2017  . Diabetes mellitus due to underlying condition with unspecified complications ([place]) [id number]  . Essential hypertension 09/05/2019  . Ex-smoker 09/05/2019  . Family history of coronary artery disease 09/05/2019  . Hyperlipidemia with target LDL less than 70 11/24/2019  . Hypertension   . Hypothyroid   . Migraine 04/25/2017  . 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 Presence of drug coated stent in right coronary artery 11/24/2019  . Pyelonephritis 05/20/2016  . RLS (restless legs syndrome)   . 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 Thyroid disease     Assessment: 60 yr old female with hx of CAB, S/P CABG, DM2, CKD IIIb, HTN presented to  ED with CP, uptrending troponins. L heart cath planned for tomorrow, 4/5. Pharmacy is consulted to dose IV heparin. Pt was on no anticoagulants PTA, per med rec. Pt has been on Lovenox 40 mg SQ daily for VTE prophylaxis, last dose at 1404 this afternoon. Per RN, no bleeding issues observed.  H/H 9.4/29.6, plt 214, CrCl 33.7 ml/min  Goal of Therapy:  Heparin level 0.3-0.7 units/ml Monitor platelets by anticoagulation protocol: Yes   Plan:  Since pt rec'd Lovenox dose <2 hrs ago, will omit heparin bolus and start heparin infusion at 850 units/hr Check 6-hr heparin level Monitor daily heparin level, CBC Monitor for bleeding F/U after cath tomorrow  Gillermina Hu, PharmD, BCPS, [facility] Clinical Pharmacist 12/30/2020,3:39 PM

## 2020-12-30 NOTE — ED Notes (Signed)
Pt ambulating to restroom at this time with assistance

## 2020-12-30 NOTE — Progress Notes (Signed)
  Echocardiogram 2D Echocardiogram has been performed.  Meghan Terry 12/30/2020, 4:38 PM

## 2020-12-30 NOTE — ED Notes (Signed)
Pt provided sandwich and drink at this time

## 2020-12-30 NOTE — ED Notes (Signed)
Advised [facility] could [facility] to [facility]

## 2020-12-31 ENCOUNTER — Encounter (HOSPITAL_COMMUNITY)
Admission: EM | Disposition: A | Discharge status: Home / Self Care | Payer: Self-pay | Source: Home / Self Care | Attending: Family Medicine

## 2020-12-31 DIAGNOSIS — Z888 Allergy status to other drugs, medicaments and biological substances status: Secondary | ICD-10-CM | POA: Diagnosis not present

## 2020-12-31 DIAGNOSIS — D631 Anemia in chronic kidney disease: Secondary | ICD-10-CM | POA: Diagnosis present

## 2020-12-31 DIAGNOSIS — E1122 Type 2 diabetes mellitus with diabetic chronic kidney disease: Secondary | ICD-10-CM | POA: Diagnosis present

## 2020-12-31 DIAGNOSIS — E785 Hyperlipidemia, unspecified: Secondary | ICD-10-CM | POA: Diagnosis present

## 2020-12-31 DIAGNOSIS — R079 Chest pain, unspecified: Secondary | ICD-10-CM | POA: Diagnosis not present

## 2020-12-31 DIAGNOSIS — F329 Major depressive disorder, single episode, unspecified: Secondary | ICD-10-CM | POA: Diagnosis present

## 2020-12-31 DIAGNOSIS — Z7984 Long term (current) use of oral hypoglycemic drugs: Secondary | ICD-10-CM | POA: Diagnosis not present

## 2020-12-31 DIAGNOSIS — Z9851 Tubal ligation status: Secondary | ICD-10-CM | POA: Diagnosis not present

## 2020-12-31 DIAGNOSIS — Z20822 Contact with and (suspected) exposure to covid-19: Secondary | ICD-10-CM | POA: Diagnosis present

## 2020-12-31 DIAGNOSIS — Z7902 Long term (current) use of antithrombotics/antiplatelets: Secondary | ICD-10-CM | POA: Diagnosis not present

## 2020-12-31 DIAGNOSIS — G2581 Restless legs syndrome: Secondary | ICD-10-CM | POA: Diagnosis present

## 2020-12-31 DIAGNOSIS — I25708 Atherosclerosis of coronary artery bypass graft(s), unspecified, with other forms of angina pectoris: Secondary | ICD-10-CM | POA: Diagnosis not present

## 2020-12-31 DIAGNOSIS — Z90710 Acquired absence of both cervix and uterus: Secondary | ICD-10-CM | POA: Diagnosis not present

## 2020-12-31 DIAGNOSIS — I129 Hypertensive chronic kidney disease with stage 1 through stage 4 chronic kidney disease, or unspecified chronic kidney disease: Secondary | ICD-10-CM | POA: Diagnosis present

## 2020-12-31 DIAGNOSIS — Z833 Family history of diabetes mellitus: Secondary | ICD-10-CM | POA: Diagnosis not present

## 2020-12-31 DIAGNOSIS — Z87891 Personal history of nicotine dependence: Secondary | ICD-10-CM | POA: Diagnosis not present

## 2020-12-31 DIAGNOSIS — Z9049 Acquired absence of other specified parts of digestive tract: Secondary | ICD-10-CM | POA: Diagnosis not present

## 2020-12-31 DIAGNOSIS — I10 Essential (primary) hypertension: Secondary | ICD-10-CM | POA: Diagnosis not present

## 2020-12-31 DIAGNOSIS — Z79890 Hormone replacement therapy (postmenopausal): Secondary | ICD-10-CM | POA: Diagnosis not present

## 2020-12-31 DIAGNOSIS — I25119 Atherosclerotic heart disease of native coronary artery with unspecified angina pectoris: Secondary | ICD-10-CM | POA: Diagnosis present

## 2020-12-31 DIAGNOSIS — R0789 Other chest pain: Secondary | ICD-10-CM | POA: Diagnosis present

## 2020-12-31 DIAGNOSIS — Z7982 Long term (current) use of aspirin: Secondary | ICD-10-CM | POA: Diagnosis not present

## 2020-12-31 DIAGNOSIS — Z8249 Family history of ischemic heart disease and other diseases of the circulatory system: Secondary | ICD-10-CM | POA: Diagnosis not present

## 2020-12-31 DIAGNOSIS — E039 Hypothyroidism, unspecified: Secondary | ICD-10-CM | POA: Diagnosis present

## 2020-12-31 DIAGNOSIS — Z951 Presence of aortocoronary bypass graft: Secondary | ICD-10-CM | POA: Diagnosis not present

## 2020-12-31 DIAGNOSIS — Z79899 Other long term (current) drug therapy: Secondary | ICD-10-CM | POA: Diagnosis not present

## 2020-12-31 DIAGNOSIS — N1832 Chronic kidney disease, stage 3b: Secondary | ICD-10-CM | POA: Diagnosis present

## 2020-12-31 DIAGNOSIS — F419 Anxiety disorder, unspecified: Secondary | ICD-10-CM | POA: Diagnosis present

## 2020-12-31 HISTORY — PX: LEFT HEART CATH AND CORS/GRAFTS ANGIOGRAPHY: CATH118250

## 2020-12-31 LAB — CBC
HCT: 30.2 % — ABNORMAL LOW (ref 36.0–46.0)
Hemoglobin: 9.7 g/dL — ABNORMAL LOW (ref 12.0–15.0)
MCH: 29.5 pg (ref 26.0–34.0)
MCHC: 32.1 g/dL (ref 30.0–36.0)
MCV: 91.8 fL (ref 80.0–100.0)
Platelets: 236 10*3/uL (ref 150–400)
RBC: 3.29 MIL/uL — ABNORMAL LOW (ref 3.87–5.11)
RDW: 14.6 % (ref 11.5–15.5)
WBC: 6 10*3/uL (ref 4.0–10.5)
nRBC: 0 % (ref 0.0–0.2)

## 2020-12-31 LAB — HEPARIN LEVEL (UNFRACTIONATED): Heparin Unfractionated: 0.53 IU/mL (ref 0.30–0.70)

## 2020-12-31 LAB — BASIC METABOLIC PANEL
Anion gap: 7 (ref 5–15)
BUN: 20 mg/dL (ref 8–23)
CO2: 19 mmol/L — ABNORMAL LOW (ref 22–32)
Calcium: 8.4 mg/dL — ABNORMAL LOW (ref 8.9–10.3)
Chloride: 113 mmol/L — ABNORMAL HIGH (ref 98–111)
Creatinine, Ser: 1.49 mg/dL — ABNORMAL HIGH (ref 0.44–1.00)
GFR, Estimated: 40 mL/min — ABNORMAL LOW (ref >60)
Glucose, Bld: 147 mg/dL — ABNORMAL HIGH (ref 70–99)
Potassium: 3.7 mmol/L (ref 3.5–5.1)
Sodium: 139 mmol/L (ref 135–145)

## 2020-12-31 LAB — GLUCOSE, CAPILLARY
Glucose-Capillary: 124 mg/dL — ABNORMAL HIGH (ref 70–99)
Glucose-Capillary: 136 mg/dL — ABNORMAL HIGH (ref 70–99)
Glucose-Capillary: 165 mg/dL — ABNORMAL HIGH (ref 70–99)

## 2020-12-31 LAB — PHOSPHORUS: Phosphorus: 3.4 mg/dL (ref 2.5–4.6)

## 2020-12-31 LAB — MAGNESIUM: Magnesium: 1.9 mg/dL (ref 1.7–2.4)

## 2020-12-31 SURGERY — LEFT HEART CATH AND CORS/GRAFTS ANGIOGRAPHY
Anesthesia: LOCAL

## 2020-12-31 MED ORDER — MIDAZOLAM HCL 2 MG/2ML IJ SOLN
INTRAMUSCULAR | Status: DC | PRN
  Administered 2020-12-31: 1 mg via INTRAVENOUS

## 2020-12-31 MED ORDER — HEPARIN (PORCINE) IN NACL 1000-0.9 UT/500ML-% IV SOLN
INTRAVENOUS | Status: DC | PRN
  Administered 2020-12-31 (×2): 500 mL

## 2020-12-31 MED ORDER — TRAZODONE HCL 50 MG PO TABS
150.0000 mg | ORAL_TABLET | Freq: Every day | ORAL | Status: DC
  Administered 2020-12-31: 150 mg via ORAL
  Filled 2020-12-31: qty 1

## 2020-12-31 MED ORDER — LIDOCAINE HCL (PF) 1 % IJ SOLN
INTRAMUSCULAR | Status: DC | PRN
  Administered 2020-12-31: 18 mL

## 2020-12-31 MED ORDER — HEPARIN (PORCINE) IN NACL 1000-0.9 UT/500ML-% IV SOLN
INTRAVENOUS | Status: AC
  Filled 2020-12-31: qty 1000

## 2020-12-31 MED ORDER — CLOPIDOGREL BISULFATE 75 MG PO TABS: 75.0000 mg | ORAL_TABLET | Freq: Every day | ORAL | Status: DC

## 2020-12-31 MED ORDER — IOHEXOL 350 MG/ML SOLN
INTRAVENOUS | Status: DC | PRN
  Administered 2020-12-31: 105 mL

## 2020-12-31 MED ORDER — ACETAMINOPHEN 325 MG PO TABS: 650.0000 mg | ORAL_TABLET | ORAL | Status: DC | PRN

## 2020-12-31 MED ORDER — SODIUM CHLORIDE 0.9 % IV SOLN: 250.0000 mL | INTRAVENOUS | Status: DC | PRN

## 2020-12-31 MED ORDER — SODIUM CHLORIDE 0.9 % WEIGHT BASED INFUSION
3.0000 mL/kg/h | INTRAVENOUS | Status: AC
  Administered 2020-12-31: 3 mL/kg/h via INTRAVENOUS

## 2020-12-31 MED ORDER — HEPARIN SODIUM (PORCINE) 5000 UNIT/ML IJ SOLN
5000.0000 [IU] | Freq: Three times a day (TID) | INTRAMUSCULAR | Status: DC
  Administered 2021-01-01: 5000 [IU] via SUBCUTANEOUS
  Filled 2020-12-31: qty 1

## 2020-12-31 MED ORDER — OXYCODONE HCL 5 MG PO TABS: 5.0000 mg | ORAL_TABLET | ORAL | Status: DC | PRN

## 2020-12-31 MED ORDER — ONDANSETRON HCL 4 MG/2ML IJ SOLN
4.0000 mg | Freq: Four times a day (QID) | INTRAMUSCULAR | Status: DC | PRN
  Administered 2020-12-31: 4 mg via INTRAVENOUS
  Filled 2020-12-31: qty 2

## 2020-12-31 MED ORDER — ONDANSETRON HCL 4 MG/2ML IJ SOLN: 4.0000 mg | Freq: Four times a day (QID) | INTRAMUSCULAR | Status: DC | PRN

## 2020-12-31 MED ORDER — HYDRALAZINE HCL 20 MG/ML IJ SOLN: 10.0000 mg | INTRAMUSCULAR | Status: AC | PRN

## 2020-12-31 MED ORDER — ASPIRIN 81 MG PO CHEW: 81.0000 mg | CHEWABLE_TABLET | Freq: Every day | ORAL | Status: DC

## 2020-12-31 MED ORDER — SODIUM CHLORIDE 0.9% FLUSH: 3.0000 mL | INTRAVENOUS | Status: DC | PRN

## 2020-12-31 MED ORDER — FENTANYL CITRATE (PF) 100 MCG/2ML IJ SOLN
INTRAMUSCULAR | Status: AC
  Filled 2020-12-31: qty 2

## 2020-12-31 MED ORDER — LABETALOL HCL 5 MG/ML IV SOLN: 10.0000 mg | INTRAVENOUS | Status: AC | PRN

## 2020-12-31 MED ORDER — MIDAZOLAM HCL 2 MG/2ML IJ SOLN
INTRAMUSCULAR | Status: AC
  Filled 2020-12-31: qty 2

## 2020-12-31 MED ORDER — IOHEXOL 350 MG/ML SOLN
INTRAVENOUS | Status: AC
  Filled 2020-12-31: qty 1

## 2020-12-31 MED ORDER — SODIUM CHLORIDE 0.9% FLUSH
3.0000 mL | Freq: Two times a day (BID) | INTRAVENOUS | Status: DC
  Administered 2020-12-31 – 2021-01-01 (×2): 3 mL via INTRAVENOUS

## 2020-12-31 MED ORDER — FENTANYL CITRATE (PF) 100 MCG/2ML IJ SOLN
INTRAMUSCULAR | Status: DC | PRN
  Administered 2020-12-31: 25 ug via INTRAVENOUS

## 2020-12-31 MED ORDER — LIDOCAINE HCL (PF) 1 % IJ SOLN
INTRAMUSCULAR | Status: AC
  Filled 2020-12-31: qty 30

## 2020-12-31 MED ORDER — SODIUM CHLORIDE 0.9 % WEIGHT BASED INFUSION
1.0000 mL/kg/h | INTRAVENOUS | Status: DC
  Administered 2020-12-31: 1 mL/kg/h via INTRAVENOUS

## 2020-12-31 MED ORDER — SODIUM CHLORIDE 0.9 % IV SOLN: INTRAVENOUS | Status: AC

## 2020-12-31 SURGICAL SUPPLY — 11 items
CATH INFINITI 5 FR IM (CATHETERS) ×2 IMPLANT
CATH INFINITI 5FR MPB2 (CATHETERS) ×2 IMPLANT
CATH INFINITI 5FR MULTPACK ANG (CATHETERS) ×2 IMPLANT
CLOSURE MYNX CONTROL 5F (Vascular Products) ×2 IMPLANT
KIT HEART LEFT (KITS) ×2 IMPLANT
PACK CARDIAC CATHETERIZATION (CUSTOM PROCEDURE TRAY) ×2 IMPLANT
SHEATH PINNACLE 5F 10CM (SHEATH) ×2 IMPLANT
SHEATH PROBE COVER 6X72 (BAG) ×2 IMPLANT
TRANSDUCER W/STOPCOCK (MISCELLANEOUS) ×2 IMPLANT
TUBING CIL FLEX 10 FLL-RA (TUBING) ×2 IMPLANT
WIRE EMERALD 3MM-J .035X150CM (WIRE) ×2 IMPLANT

## 2020-12-31 NOTE — Progress Notes (Signed)
Progress Note  Patient Name: Meghan Terry Date of Encounter: 12/31/2020  [facility] Cardiologist: Jenean Lindau, MD   Subjective   Having some nausea this morning from being NPO and taking her medications. Also with some mild chest discomfort; declined nitro as already has a headache.  Plan for cath today Cr 1.49  Inpatient Medications    Scheduled Meds: . aspirin  324 mg Oral Once  . aspirin EC  81 mg Oral Daily  . buPROPion  300 mg Oral Daily  . clopidogrel  75 mg Oral Daily  . ferrous sulfate  325 mg Oral Q breakfast  . isosorbide mononitrate  30 mg Oral Daily  . [START ON 01/04/2021] levothyroxine  50 mcg Oral Once per day on Sun Sat  . levothyroxine  75 mcg Oral Once per day on Mon Tue Wed Thu Fri  . metoprolol tartrate  50 mg Oral BID WC  . ranolazine  1,000 mg Oral BID  . rOPINIRole  1 mg Oral QID  . rosuvastatin  40 mg Oral QHS  . topiramate  100 mg Oral BID  . traZODone  150 mg Oral Daily   Continuous Infusions: . sodium chloride 30 mL/hr at 12/30/20 2302  . heparin 850 Units/hr (12/31/20 0200)   PRN Meds: acetaminophen **OR** acetaminophen, lactulose, nitroGLYCERIN, traMADol   Vital Signs    Vitals:   12/30/20 2018 12/31/20 0009 12/31/20 0442 12/31/20 0724  BP: 118/61 (!) 113/50 (!) 125/55 (!) 149/74  Pulse: 71 73 72 88  Resp: '18 18 18 17  '$ Temp: 98 F (36.7 C) 98.4 F (36.9 C) 98.6 F (37 C) 98.3 F (36.8 C)  TempSrc: Oral Oral Oral Oral  SpO2: 99% 99% 98% 98%  Weight:      Height:        Intake/Output Summary (Last 24 hours) at 12/31/2020 1140 Last data filed at 12/31/2020 0300 Gross per 24 hour  Intake 341.44 ml  Output --  Net 341.44 ml   Last 3 Weights 12/29/2020 05/07/2020 03/22/2020  Weight (lbs) 157 lb 147 lb 6.4 oz 151 lb  Weight (kg) 71.215 kg 66.86 kg 68.493 kg  Some encounter information is confidential and restricted. Go to Review Flowsheets activity to see all data.      Telemetry    NSR - Personally Reviewed  ECG    No  new tracing - Personally Reviewed  Physical Exam   GEN: Tired appearing, NAD  Neck: No JVD Cardiac: RRR, no murmurs, rubs, or gallops.  Respiratory: Clear to auscultation bilaterally. GI: Soft, nontender, non-distended  MS: No edema; No deformity. Neuro:  Nonfocal  Psych: Normal affect   Labs    High Sensitivity Troponin:   Recent Labs  Lab 12/29/20 2116 12/29/20 2337 12/30/20 0312 12/30/20 0654  TROPONINIHS 7 19* 72* 74*      Chemistry Recent Labs  Lab 12/29/20 2116 12/30/20 0312 12/31/20 0021  NA 139 142 139  K 3.3* 3.5 3.7  CL 115* 114* 113*  CO2 19* 22 19*  GLUCOSE 159* 133* 147*  BUN 26* 26* 20  CREATININE 1.76* 1.66* 1.49*  CALCIUM 8.6* 8.6* 8.4*  GFRNONAA 33* 35* 40*  ANIONGAP '5 6 7     '$ Hematology Recent Labs  Lab 12/29/20 2116 12/30/20 0312 12/31/20 0021  WBC 6.5 5.1 6.0  RBC 3.47* 3.18* 3.29*  HGB 10.0* 9.4* 9.7*  HCT 31.7* 29.6* 30.2*  MCV 91.4 93.1 91.8  MCH 28.8 29.6 29.5  MCHC 31.5 31.8 32.1  RDW 14.0 14.2 14.6  PLT 222 214 236    BNPNo results for input(s): BNP, PROBNP in the last 168 hours.   DDimer  Recent Labs  Lab 12/30/20 0506  DDIMER 0.52*     Radiology    DG Chest 2 View  Result Date: 12/29/2020 CLINICAL DATA:  Chest pain EXAM: CHEST - 2 VIEW COMPARISON:  03/12/2020 FINDINGS: Prior CABG. Heart and mediastinal contours are within normal limits. No focal opacities or effusions. No acute bony abnormality. IMPRESSION: No active cardiopulmonary disease. Electronically Signed   By: Rolm Baptise M.D.   On: 12/29/2020 21:48   ECHOCARDIOGRAM COMPLETE  Result Date: 12/30/2020    ECHOCARDIOGRAM REPORT   Patient Name:   Meghan Terry Date of Exam: 12/30/2020 Medical Rec #:  [id number]    Height:       63.0 in Accession #:    [id number]   Weight:       157.0 lb Date of Birth:  December 14, 1950     BSA:          1.745 m Patient Age:    60 years     BP:           128/72 mmHg Patient Gender: F            HR:           68 bpm. Exam Location:   Inpatient Procedure: 2D Echo Indications:    Chest pain  History:        Patient has prior history of Echocardiogram examinations, most                 recent 09/14/2019. Prior CABG; Risk Factors:Hypertension and                 Diabetes.  Sonographer:    Johny Chess Referring Phys: [place]  1. Left ventricular ejection fraction, by estimation, is 60 to 65%. The left ventricle has normal function. The left ventricle has no regional wall motion abnormalities. Left ventricular diastolic parameters were normal.  2. Right ventricular systolic function is normal. The right ventricular size is normal. There is normal pulmonary artery systolic pressure.  3. Left atrial size was mildly dilated.  4. The mitral valve is abnormal. Mild mitral valve regurgitation. No evidence of mitral stenosis.  5. The aortic valve is tricuspid. There is mild calcification of the aortic valve. Aortic valve regurgitation is not visualized. Mild to moderate aortic valve sclerosis/calcification is present, without any evidence of aortic stenosis.  6. The inferior vena cava is normal in size with greater than 50% respiratory variability, suggesting right atrial pressure of 3 mmHg. FINDINGS  Left Ventricle: Left ventricular ejection fraction, by estimation, is 60 to 65%. The left ventricle has normal function. The left ventricle has no regional wall motion abnormalities. The left ventricular internal cavity size was normal in size. There is  no left ventricular hypertrophy. Left ventricular diastolic parameters were normal. Right Ventricle: The right ventricular size is normal. No increase in right ventricular wall thickness. Right ventricular systolic function is normal. There is normal pulmonary artery systolic pressure. The tricuspid regurgitant velocity is 2.42 m/s, and  with an assumed right atrial pressure of 3 mmHg, the estimated right ventricular systolic pressure is 99991111 mmHg. Left Atrium: Left atrial size  was mildly dilated. Right Atrium: Right atrial size was normal in size. Pericardium: There is no evidence of pericardial effusion. Mitral Valve: The mitral valve is abnormal. There is mild  thickening of the mitral valve leaflet(s). There is mild calcification of the mitral valve leaflet(s). Mild mitral valve regurgitation. No evidence of mitral valve stenosis. Tricuspid Valve: The tricuspid valve is normal in structure. Tricuspid valve regurgitation is trivial. No evidence of tricuspid stenosis. Aortic Valve: The aortic valve is tricuspid. There is mild calcification of the aortic valve. Aortic valve regurgitation is not visualized. Mild to moderate aortic valve sclerosis/calcification is present, without any evidence of aortic stenosis. Pulmonic Valve: The pulmonic valve was normal in structure. Pulmonic valve regurgitation is not visualized. No evidence of pulmonic stenosis. Aorta: The aortic root is normal in size and structure. Venous: The inferior vena cava is normal in size with greater than 50% respiratory variability, suggesting right atrial pressure of 3 mmHg. IAS/Shunts: No atrial level shunt detected by color flow Doppler.  LEFT VENTRICLE PLAX 2D LVIDd:         4.60 cm  Diastology LVIDs:         3.00 cm  LV e' medial:    7.07 cm/s LV PW:         1.00 cm  LV E/e' medial:  14.1 LV IVS:        1.00 cm  LV e' lateral:   8.59 cm/s LVOT diam:     1.70 cm  LV E/e' lateral: 11.6 LV SV:         43 LV SV Index:   25 LVOT Area:     2.27 cm  RIGHT VENTRICLE             IVC RV S prime:     10.90 cm/s  IVC diam: 1.70 cm TAPSE (M-mode): 1.8 cm LEFT ATRIUM             Index       RIGHT ATRIUM           Index LA diam:        4.30 cm 2.46 cm/m  RA Area:     12.60 cm LA Vol (A2C):   40.3 ml 23.10 ml/m RA Volume:   29.20 ml  16.74 ml/m LA Vol (A4C):   36.1 ml 20.69 ml/m LA Biplane Vol: 38.4 ml 22.01 ml/m  AORTIC VALVE LVOT Vmax:   82.90 cm/s LVOT Vmean:  55.100 cm/s LVOT VTI:    0.190 m  AORTA Ao Root diam: 3.10 cm Ao  Asc diam:  2.90 cm MITRAL VALVE               TRICUSPID VALVE MV Area (PHT): 2.87 cm    TR Peak grad:   23.4 mmHg MV Decel Time: 264 msec    TR Vmax:        242.00 cm/s MV E velocity: 99.40 cm/s MV A velocity: 61.30 cm/s  SHUNTS MV E/A ratio:  1.62        Systemic VTI:  0.19 m                            Systemic Diam: 1.70 cm Jenkins Rouge MD Electronically signed by Jenkins Rouge MD Signature Date/Time: 12/30/2020/4:39:08 PM    Final     Cardiac Studies   Left heart catheterization 03/13/20:  Colon Flattery RCA to Prox RCA lesion is 30% stenosed.  Previously placed Prox RCA to Dist RCA stent (unknown type) is widely patent.  Previously placed RPAV stent (unknown type) is widely patent.  RPDA lesion is 100% stenosed.  Ost Cx to Prox Cx  lesion is 100% stenosed.  3rd Mrg lesion is 100% stenosed.  Mid Cx to Dist Cx lesion is 100% stenosed.  Origin to Prox Graft lesion is 75% stenosed.  Mid LAD-1 lesion is 75% stenosed.  Mid LAD-2 lesion is 95% stenosed.  IMPRESSION:Ms. Barret has a patent vein to the PDA which was subtotally occluded at the time of her last cath 4 months ago. I suspect this may been related to spasm. The previously placed stents by Dr. Claiborne Billings in the proximal mid and distal RCA and PLA branches are completely patent. The vein to the circumflex obtuse marginal branch has a moderately high-grade segmental ostial/proximal stenosis although the small circumflex marginal branch distal to the graft insertion has occluded since the last cath. The graft only supplies a small posterior lateral distal branch with retrograde filling. This only supplies a small amount of myocardium. The LIMA is intact. I reviewed the films with Dr. Claiborne Billings we both agree that medical therapy is the best option. I have communicated this to Dr. Marlou Porch as well. A right common femoral angiogram was performed and a Mynx closure device was successfully deployed achieving hemostasis. The patient left the lab in stable  condition.   Intervention   LHC 11/23/19:   Ost LAD to Prox LAD lesion is 70% stenosed.  Prox LAD to Mid LAD lesion is 50% stenosed with 50% stenosed side branch in 1st Diag.  Mid Cx lesion is 99% stenosed.  2nd Mrg-2 lesion is 95% stenosed with 0% stenosed side branch in Lat 2nd Mrg.  2nd Mrg-1 lesion is 95% stenosed.  RPAV lesion is 95% stenosed.  Prox RCA to Mid RCA lesion is 80% stenosed.  Dist RCA lesion is 80% stenosed with 70% stenosed side branch in RPAV.  Origin to Prox Graft lesion is 80% stenosed.  Prox Graft to Mid Graft lesion is 70% stenosed.  A stent was successfully placed.  Post intervention, there is a 0% residual stenosis.  Post intervention, the side branch was reduced to 0% residual stenosis.  Post intervention, there is a 0% residual stenosis.  Post intervention, there is a 0% residual stenosis.  A stent was successfully placed.  A stent was successfully placed.  Difficult but ultimately successful percutaneous coronary intervention to the native dominant RCA in a patient with documented diffuse disease in the vein graft supplying her PDA vessel.   A total of 3 stents were inserted with a 2.0 x 30 mm Resolute Onyx DES stent most distal with tandem overlap with a 2.0 x 12 mm stent to cover the 80 and 95% distal stenoses before the PDA and PLA vessels and of a 2.5 x 30 mm Resolute stent to cover the 80% stenosis in the mid vessel followed by an additional area of narrowing of 70% just proximal to the acute margin. Prior to stenting, there was suggestion of distal RCA dissection following very low level inflation with a 2.0 x 12 balloon at the distal 95% stenosis which was successfully treated with stent insertion. At the completion of the procedure, all areas were reduced to 0%. There was brisk TIMI-3 flow. There was no evidence for dissection. Competitive filling was present down the PDA due to the SVG supplying the mid PDA  region.  RECOMMENDATION: Long-term DAPT indefinitely. Hydration post procedure in this patient with baseline renal insufficiency. S of lipid-lowering therapy with target LDL less than 70. Concomitant medical therapy with nitrates, and a blocker, as well as ranolazine which was added yesterday for the patient's  distal circumflex disease. Patient will follow up with Dr. Geraldo Pitter.  Diagnostic Dominance: Right    Intervention     TTE 12/30/20: 1. Left ventricular ejection fraction, by estimation, is 60 to 65%. The  left ventricle has normal function. The left ventricle has no regional  wall motion abnormalities. Left ventricular diastolic parameters were  normal.  2. Right ventricular systolic function is normal. The right ventricular  size is normal. There is normal pulmonary artery systolic pressure.  3. Left atrial size was mildly dilated.  4. The mitral valve is abnormal. Mild mitral valve regurgitation. No  evidence of mitral stenosis.  5. The aortic valve is tricuspid. There is mild calcification of the  aortic valve. Aortic valve regurgitation is not visualized. Mild to  moderate aortic valve sclerosis/calcification is present, without any  evidence of aortic stenosis.  6. The inferior vena cava is normal in size with greater than 50%  respiratory variability, suggesting right atrial pressure of 3 mmHg.   Patient Profile     60 y.o. female with a history of CAD s/p CABG 08/2019 (LIMA to LAD, SVG to distal OM and SVG to PDA with early graft failure of the SVG to PDA), PCI to native RCA 09/2019, HTN, HLD, CKD stage III and DM2 who presented with chest pain and mildly elevated troponin for which Cardiology has been consulted.   Assessment & Plan    #Chest pain with hx of CAD s/p CABG: #NSTEMI: Pt [facility]20 with chest pain which began approximately 8am on Sunday morning. She stated that she was already awake for the day and was in her house when she  began having "crushing, severe" chest pain. Over the course of the day, she took 8 SL NTG tablets which wouls help temporarily however the pain would return. Has a complicated CAD hx with CABG x3 08/2019 with early graft failure with SVG to PDA requiring PCI to native RCA 1/202>> recommendations for medical management of LCx disease. She presented once again 02/2020 with chest pain with EKG showing no ischemia and negative HsT. Repeat LHC at that time showed patent stent in SVG to PDA, patent mid and distal RCA and PLA stents were patent, SVG to OM branch showed moderately high-grade stenosis with small OM branch distal to the graft showing a new occlusion since last cath which was too small for PCI, and patent LIMA. She was recommended for medical management. Given recurrent and persistent symptoms on this admission, will proceed with coronary angiography -Plan for cath today; currently NPO -Continue ASA, Plavix -Continue crestor '40mg'$  daily -Continue Ranexa and imdur -Change metop to long acting prior to discharge -TTE with normal BiV function, no significant WMA, mild MR -Cardiac catheterization was discussed with the patient fully. The patient understands that risks include but are not limited to stroke (1 in 1000), death (1 in 61), kidney failure [usually temporary] (1 in 500), bleeding (1 in 200), allergic reaction [possibly serious] (1 in 200). The patient understands and is willing to proceed.    #HTN: Controlled. -Continue amlodipine, metoprolol, and Imdur -Start ACE/ARB post-cath as above  #HLD: -Last LDL 61 11/2019 with LDL goal <70 -Continue rosuvastatin '40mg'$  daily.  #CKD stage III: Back to baseline at 1.5 today. -Trend -Prehydrated prior to cath  #Pre-DM2: -HbA1c, 5.0>>>well controlled  -Diet controlled   #Hypothyroidism: -TSH, 0.932 -Continue synthroid    INFORMED CONSENT: I have reviewed the risks, indications, and alternatives to cardiac catheterization,  possible angioplasty, and stenting with the  patient. Risks include but are not limited to bleeding, infection, vascular injury, stroke, myocardial infection, arrhythmia, kidney injury, radiation-related injury in the case of prolonged fluoroscopy use, emergency cardiac surgery, and death. The patient understands the risks of serious complication is 1-2 in [id number] with diagnostic cardiac cath and 1-2% or less with angioplasty/stenting.       For questions or updates, please contact [place] Please consult [email or web address] for contact info under        Signed, Freada Bergeron, MD  12/31/2020, 11:40 AM

## 2020-12-31 NOTE — Progress Notes (Signed)
PROGRESS NOTE    Meghan Terry  [id number] DOB: 12-22-1960 DOA: 12/29/2020 PCP: Garwin Brothers, MD    Brief Narrative:  This 60 years old female with PMH significant for CAD s/p CABG, chronic kidney disease stage IIIb, history of diabetes, anxiety, depression, hypertension, hypothyroidism, migraine presents in the ER with complaints of chest pain.  Patient reports that her daughter just got married 2 days ago and she was taking care of her daughter's kids and got exhausted  And has developed chest pain, so far she had taken 8 nitro sublingual with temporary relief.  She denies any associated shortness of breath, diaphoresis, abdominal pain nausea vomiting or diarrhea.  In the ER EKG shows sinus tachycardia,  high sensitive troponins were trending up. Cardiology was consulted.  Plan to have left heart cath today.  Assessment & Plan:   Principal Problem:   Chest pain Active Problems:   Essential hypertension   Diabetes mellitus due to underlying condition with unspecified complications (HCC)   S/P CABG x 3   CKD (chronic kidney disease) stage 3b, GFR 30-59 ml/min   Chest pain with history of CAD: Patient status post CABG reports chest pain concerning for angina.   Troponin trending up.  EKG : No ST-T wave changes. Continue aspirin, Plavix, statins ,Ranexa, Imdur beta-blockers.   Cardiology consulted Consult cardiology.  Given high risk CAD history recommended left heart cath. 2D Echo: LVEF 60 to 65%, no regional wall motion abnormalities. Started on heparin GTT. Patient is scheduled to have left heart cath today 4/5  Diabetes mellitus: Patient is not on any antidiabetic medication. Follow blood sugars and sliding scale if needed,  CKD stage IIIa: creatinine seems to be at baseline.   Monitor renal functions, avoid nephrotoxic medications.  Hypokalemia: Replaced and improved.  Hypothyroidism : Continue levothyroxine.  Chronic anemia : H&H remained stable.  DVT prophylaxis:  Heparin gtt. Code Status: Full code Family Communication: No family at bedside  Disposition Plan:   Status is: Inpatient  Remains inpatient appropriate because:Inpatient level of care appropriate due to severity of illness   Dispo: The patient is from: Home              Anticipated d/c is to: Home              Patient currently is not medically stable to d/c.   Difficult to place patient No   Consultants:    Cardiology  Procedures:  LHC 4/5.  Antimicrobials:   Anti-infectives (From admission, onward)   None     Subjective: Patient was seen and examined at bedside.  Overnight events noted.  Patient reported having chest pain last night,  which is now resolved.  She is going to have left heart cath today.  Seems more anxious.  Objective: Vitals:   12/31/20 0009 12/31/20 0442 12/31/20 0724 12/31/20 1231  BP: (!) 113/50 (!) 125/55 (!) 149/74 (!) 104/52  Pulse: 73 72 88 66  Resp: '18 18 17 19  '$ Temp: 98.4 F (36.9 C) 98.6 F (37 C) 98.3 F (36.8 C) 98.2 F (36.8 C)  TempSrc: Oral Oral Oral Oral  SpO2: 99% 98% 98% 98%  Weight:      Height:        Intake/Output Summary (Last 24 hours) at 12/31/2020 1444 Last data filed at 12/31/2020 0300 Gross per 24 hour  Intake 341.44 ml  Output --  Net 341.44 ml   Filed Weights   12/29/20 2114  Weight: 71.2 kg  Examination:  General exam: Appears calm and comfortable, not in any acute distress. Respiratory system: Clear to auscultation. Respiratory effort normal. Cardiovascular system: S1 & S2 heard, RRR. No JVD, murmurs, rubs, gallops or clicks. No pedal edema. Gastrointestinal system: Abdomen is nondistended, soft and nontender. No organomegaly or masses felt. Normal bowel sounds heard. Central nervous system: Alert and oriented. No focal neurological deficits. Extremities: Symmetric 5 x 5 power.  No edema, no cyanosis, no clubbing. Skin: No rashes, lesions or ulcers Psychiatry: Judgement and insight appear normal. Mood &  affect appropriate.     Data Reviewed: I have personally reviewed following labs and imaging studies  CBC: Recent Labs  Lab 12/29/20 2116 12/30/20 0312 12/31/20 0021  WBC 6.5 5.1 6.0  HGB 10.0* 9.4* 9.7*  HCT 31.7* 29.6* 30.2*  MCV 91.4 93.1 91.8  PLT 222 214 [id number]   Basic Metabolic Panel: Recent Labs  Lab 12/29/20 2116 12/30/20 0312 12/31/20 0021  NA 139 142 139  K 3.3* 3.5 3.7  CL 115* 114* 113*  CO2 19* 22 19*  GLUCOSE 159* 133* 147*  BUN 26* 26* 20  CREATININE 1.76* 1.66* 1.49*  CALCIUM 8.6* 8.6* 8.4*  MG  --   --  1.9  PHOS  --   --  3.4   GFR: Estimated Creatinine Clearance: 37.5 mL/min (A) (by C-G formula based on SCr of 1.49 mg/dL (H)). Liver Function Tests: No results for input(s): AST, ALT, ALKPHOS, BILITOT, PROT, ALBUMIN in the last 168 hours. No results for input(s): LIPASE, AMYLASE in the last 168 hours. No results for input(s): AMMONIA in the last 168 hours. Coagulation Profile: No results for input(s): INR, PROTIME in the last 168 hours. Cardiac Enzymes: No results for input(s): CKTOTAL, CKMB, CKMBINDEX, TROPONINI in the last 168 hours. BNP (last 3 results) No results for input(s): PROBNP in the last 8760 hours. HbA1C: No results for input(s): HGBA1C in the last 72 hours. CBG: Recent Labs  Lab 12/30/20 1154 12/30/20 1613 12/30/20 2141 12/31/20 0627 12/31/20 1043  GLUCAP 123* 104* 90 124* 136*   Lipid Profile: No results for input(s): CHOL, HDL, LDLCALC, TRIG, CHOLHDL, LDLDIRECT in the last 72 hours. Thyroid Function Tests: No results for input(s): TSH, T4TOTAL, FREET4, T3FREE, THYROIDAB in the last 72 hours. Anemia Panel: No results for input(s): VITAMINB12, FOLATE, FERRITIN, TIBC, IRON, RETICCTPCT in the last 72 hours. Sepsis Labs: No results for input(s): PROCALCITON, LATICACIDVEN in the last 168 hours.  Recent Results (from the past 240 hour(s))  Resp Panel by RT-PCR (Flu A&B, Covid) Nasopharyngeal Swab     Status: None    Collection Time: 12/30/20  1:14 AM   Specimen: Nasopharyngeal Swab; Nasopharyngeal(NP) swabs in vial transport medium  Result Value Ref Range Status   SARS Coronavirus 2 by RT PCR NEGATIVE NEGATIVE Final    Comment: (NOTE) SARS-CoV-2 target nucleic acids are NOT DETECTED.  The SARS-CoV-2 RNA is generally detectable in upper respiratory specimens during the acute phase of infection. The lowest concentration of SARS-CoV-2 viral copies this assay can detect is 138 copies/mL. A negative result does not preclude SARS-Cov-2 infection and should not be used as the sole basis for treatment or other patient management decisions. A negative result may occur with  improper specimen collection/handling, submission of specimen other than nasopharyngeal swab, presence of viral mutation(s) within the areas targeted by this assay, and inadequate number of viral copies(<138 copies/mL). A negative result must be combined with clinical observations, patient history, and epidemiological information. The expected result is  Negative.  Fact Sheet for Patients:  EntrepreneurPulse.com.au  Fact Sheet for Healthcare Providers:  IncredibleEmployment.be  This test is no t yet approved or cleared by the [place] FDA and  has been authorized for detection and/or diagnosis of SARS-CoV-2 by FDA under an Emergency Use Authorization (EUA). This EUA will remain  in effect (meaning this test can be used) for the duration of the COVID-19 declaration under Section 564(b)(1) of the Act, 21 U.S.C.section 360bbb-3(b)(1), unless the authorization is terminated  or revoked sooner.       Influenza A by PCR NEGATIVE NEGATIVE Final   Influenza B by PCR NEGATIVE NEGATIVE Final    Comment: (NOTE) The Xpert Xpress SARS-CoV-2/FLU/RSV plus assay is intended as an aid in the diagnosis of influenza from Nasopharyngeal swab specimens and should not be used as a sole basis for treatment.  Nasal washings and aspirates are unacceptable for Xpert Xpress SARS-CoV-2/FLU/RSV testing.  Fact Sheet for Patients: EntrepreneurPulse.com.au  Fact Sheet for Healthcare Providers: IncredibleEmployment.be  This test is not yet approved or cleared by the [place] FDA and has been authorized for detection and/or diagnosis of SARS-CoV-2 by FDA under an Emergency Use Authorization (EUA). This EUA will remain in effect (meaning this test can be used) for the duration of the COVID-19 declaration under Section 564(b)(1) of the Act, 21 U.S.C. section 360bbb-3(b)(1), unless the authorization is terminated or revoked.  Performed at [facility], [place] [address], [place], [place] [address]     Radiology Studies: DG Chest 2 View  Result Date: 12/29/2020 CLINICAL DATA:  Chest pain EXAM: CHEST - 2 VIEW COMPARISON:  03/12/2020 FINDINGS: Prior CABG. Heart and mediastinal contours are within normal limits. No focal opacities or effusions. No acute bony abnormality. IMPRESSION: No active cardiopulmonary disease. Electronically Signed   By: Rolm Baptise M.D.   On: 12/29/2020 21:48   ECHOCARDIOGRAM COMPLETE  Result Date: 12/30/2020    ECHOCARDIOGRAM REPORT   Patient Name:   Meghan Terry Date of Exam: 12/30/2020 Medical Rec #:  [id number]    Height:       63.0 in Accession #:    [id number]   Weight:       157.0 lb Date of Birth:  [birth year]     BSA:          1.745 m Patient Age:    16 years     BP:           128/72 mmHg Patient Gender: F            HR:           68 bpm. Exam Location:  Inpatient Procedure: 2D Echo Indications:    Chest pain  History:        Patient has prior history of Echocardiogram examinations, most                 recent 09/14/2019. Prior CABG; Risk Factors:Hypertension and                 Diabetes.  Sonographer:    Johny Chess Referring Phys: [place]  1. Left ventricular ejection fraction, by estimation, is 60 to 65%.  The left ventricle has normal function. The left ventricle has no regional wall motion abnormalities. Left ventricular diastolic parameters were normal.  2. Right ventricular systolic function is normal. The right ventricular size is normal. There is normal pulmonary artery systolic pressure.  3. Left atrial size was mildly dilated.  4.  The mitral valve is abnormal. Mild mitral valve regurgitation. No evidence of mitral stenosis.  5. The aortic valve is tricuspid. There is mild calcification of the aortic valve. Aortic valve regurgitation is not visualized. Mild to moderate aortic valve sclerosis/calcification is present, without any evidence of aortic stenosis.  6. The inferior vena cava is normal in size with greater than 50% respiratory variability, suggesting right atrial pressure of 3 mmHg. FINDINGS  Left Ventricle: Left ventricular ejection fraction, by estimation, is 60 to 65%. The left ventricle has normal function. The left ventricle has no regional wall motion abnormalities. The left ventricular internal cavity size was normal in size. There is  no left ventricular hypertrophy. Left ventricular diastolic parameters were normal. Right Ventricle: The right ventricular size is normal. No increase in right ventricular wall thickness. Right ventricular systolic function is normal. There is normal pulmonary artery systolic pressure. The tricuspid regurgitant velocity is 2.42 m/s, and  with an assumed right atrial pressure of 3 mmHg, the estimated right ventricular systolic pressure is [id number] mmHg. Left Atrium: Left atrial size was mildly dilated. Right Atrium: Right atrial size was normal in size. Pericardium: There is no evidence of pericardial effusion. Mitral Valve: The mitral valve is abnormal. There is mild thickening of the mitral valve leaflet(s). There is mild calcification of the mitral valve leaflet(s). Mild mitral valve regurgitation. No evidence of mitral valve stenosis. Tricuspid Valve: The tricuspid  valve is normal in structure. Tricuspid valve regurgitation is trivial. No evidence of tricuspid stenosis. Aortic Valve: The aortic valve is tricuspid. There is mild calcification of the aortic valve. Aortic valve regurgitation is not visualized. Mild to moderate aortic valve sclerosis/calcification is present, without any evidence of aortic stenosis. Pulmonic Valve: The pulmonic valve was normal in structure. Pulmonic valve regurgitation is not visualized. No evidence of pulmonic stenosis. Aorta: The aortic root is normal in size and structure. Venous: The inferior vena cava is normal in size with greater than 50% respiratory variability, suggesting right atrial pressure of 3 mmHg. IAS/Shunts: No atrial level shunt detected by color flow Doppler.  LEFT VENTRICLE PLAX 2D LVIDd:         4.60 cm  Diastology LVIDs:         3.00 cm  LV e' medial:    7.07 cm/s LV PW:         1.00 cm  LV E/e' medial:  14.1 LV IVS:        1.00 cm  LV e' lateral:   8.59 cm/s LVOT diam:     1.70 cm  LV E/e' lateral: 11.6 LV SV:         43 LV SV Index:   25 LVOT Area:     2.27 cm  RIGHT VENTRICLE             IVC RV S prime:     10.90 cm/s  IVC diam: 1.70 cm TAPSE (M-mode): 1.8 cm LEFT ATRIUM             Index       RIGHT ATRIUM           Index LA diam:        4.30 cm 2.46 cm/m  RA Area:     12.60 cm LA Vol (A2C):   40.3 ml 23.10 ml/m RA Volume:   29.20 ml  16.74 ml/m LA Vol (A4C):   36.1 ml 20.69 ml/m LA Biplane Vol: 38.4 ml 22.01 ml/m  AORTIC VALVE LVOT Vmax:  82.90 cm/s LVOT Vmean:  55.100 cm/s LVOT VTI:    0.190 m  AORTA Ao Root diam: 3.10 cm Ao Asc diam:  2.90 cm MITRAL VALVE               TRICUSPID VALVE MV Area (PHT): 2.87 cm    TR Peak grad:   23.4 mmHg MV Decel Time: 264 msec    TR Vmax:        242.00 cm/s MV E velocity: 99.40 cm/s MV A velocity: 61.30 cm/s  SHUNTS MV E/A ratio:  1.62        Systemic VTI:  0.19 m                            Systemic Diam: 1.70 cm Jenkins Rouge MD Electronically signed by Jenkins Rouge MD  Signature Date/Time: 12/30/2020/4:39:08 PM    Final    Scheduled Meds: . aspirin  324 mg Oral Once  . aspirin EC  81 mg Oral Daily  . buPROPion  300 mg Oral Daily  . clopidogrel  75 mg Oral Daily  . ferrous sulfate  325 mg Oral Q breakfast  . isosorbide mononitrate  30 mg Oral Daily  . [START ON 01/04/2021] levothyroxine  50 mcg Oral Once per day on Sun Sat  . levothyroxine  75 mcg Oral Once per day on Mon Tue Wed Thu Fri  . metoprolol tartrate  50 mg Oral BID WC  . ranolazine  1,000 mg Oral BID  . rOPINIRole  1 mg Oral QID  . rosuvastatin  40 mg Oral QHS  . topiramate  100 mg Oral BID  . traZODone  150 mg Oral Daily   Continuous Infusions: . sodium chloride 30 mL/hr at 12/30/20 2302  . sodium chloride    . sodium chloride 1 mL/kg/hr (12/31/20 1410)  . heparin 850 Units/hr (12/31/20 0200)     LOS: 0 days    Time spent: 35 mins    Bing Duffey, MD [facility]   If 7PM-7AM, please contact night-coverage

## 2020-12-31 NOTE — Interval H&P Note (Signed)
History and Physical Interval Note:  12/31/2020 4:42 PM  Meghan Terry  has presented today for surgery, with the diagnosis of unstable angina.  The various methods of treatment have been discussed with the patient and family. After consideration of risks, benefits and other options for treatment, the patient has consented to  Procedure(s): LEFT HEART CATH AND CORS/GRAFTS ANGIOGRAPHY (N/A) as a surgical intervention.  The patient's history has been reviewed, patient examined, no change in status, stable for surgery.  I have reviewed the patient's chart and labs.  Questions were answered to the patient's satisfaction.     Belva Crome III

## 2020-12-31 NOTE — Interval H&P Note (Signed)
Cath Lab Visit (complete for each Cath Lab visit)  Clinical Evaluation Leading to the Procedure:   ACS: Yes.    Non-ACS:    Anginal Classification: CCS III  Anti-ischemic medical therapy: Minimal Therapy (1 class of medications)  Non-Invasive Test Results: No non-invasive testing performed  Prior CABG: No previous CABG      History and Physical Interval Note:  12/31/2020 4:47 PM  [name]  has presented today for surgery, with the diagnosis of unstable angina.  The various methods of treatment have been discussed with the patient and family. After consideration of risks, benefits and other options for treatment, the patient has consented to  Procedure(s): LEFT HEART CATH AND CORS/GRAFTS ANGIOGRAPHY (N/A) as a surgical intervention.  The patient's history has been reviewed, patient examined, no change in status, stable for surgery.  I have reviewed the patient's chart and labs.  Questions were answered to the patient's satisfaction.     Belva Crome III

## 2020-12-31 NOTE — H&P (View-Only) (Signed)
Progress Note  Patient Name: Meghan Terry Date of Encounter: 12/31/2020  [facility] Cardiologist: Jenean Lindau, MD   Subjective   Having some nausea this morning from being NPO and taking her medications. Also with some mild chest discomfort; declined nitro as already has a headache.  Plan for cath today Cr 1.49  Inpatient Medications    Scheduled Meds: . aspirin  324 mg Oral Once  . aspirin EC  81 mg Oral Daily  . buPROPion  300 mg Oral Daily  . clopidogrel  75 mg Oral Daily  . ferrous sulfate  325 mg Oral Q breakfast  . isosorbide mononitrate  30 mg Oral Daily  . [START ON 01/04/2021] levothyroxine  50 mcg Oral Once per day on Sun Sat  . levothyroxine  75 mcg Oral Once per day on Mon Tue Wed Thu Fri  . metoprolol tartrate  50 mg Oral BID WC  . ranolazine  1,000 mg Oral BID  . rOPINIRole  1 mg Oral QID  . rosuvastatin  40 mg Oral QHS  . topiramate  100 mg Oral BID  . traZODone  150 mg Oral Daily   Continuous Infusions: . sodium chloride 30 mL/hr at 12/30/20 2302  . heparin 850 Units/hr (12/31/20 0200)   PRN Meds: acetaminophen **OR** acetaminophen, lactulose, nitroGLYCERIN, traMADol   Vital Signs    Vitals:   12/30/20 2018 12/31/20 0009 12/31/20 0442 12/31/20 0724  BP: 118/61 (!) 113/50 (!) 125/55 (!) 149/74  Pulse: 71 73 72 88  Resp: '18 18 18 17  '$ Temp: 98 F (36.7 C) 98.4 F (36.9 C) 98.6 F (37 C) 98.3 F (36.8 C)  TempSrc: Oral Oral Oral Oral  SpO2: 99% 99% 98% 98%  Weight:      Height:        Intake/Output Summary (Last 24 hours) at 12/31/2020 1140 Last data filed at 12/31/2020 0300 Gross per 24 hour  Intake 341.44 ml  Output --  Net 341.44 ml   Last 3 Weights 12/29/2020 05/07/2020 03/22/2020  Weight (lbs) 157 lb 147 lb 6.4 oz 151 lb  Weight (kg) 71.215 kg 66.86 kg 68.493 kg  Some encounter information is confidential and restricted. Go to Review Flowsheets activity to see all data.      Telemetry    NSR - Personally Reviewed  ECG    No  new tracing - Personally Reviewed  Physical Exam   GEN: Tired appearing, NAD  Neck: No JVD Cardiac: RRR, no murmurs, rubs, or gallops.  Respiratory: Clear to auscultation bilaterally. GI: Soft, nontender, non-distended  MS: No edema; No deformity. Neuro:  Nonfocal  Psych: Normal affect   Labs    High Sensitivity Troponin:   Recent Labs  Lab 12/29/20 2116 12/29/20 2337 12/30/20 0312 12/30/20 0654  TROPONINIHS 7 19* 72* 74*      Chemistry Recent Labs  Lab 12/29/20 2116 12/30/20 0312 12/31/20 0021  NA 139 142 139  K 3.3* 3.5 3.7  CL 115* 114* 113*  CO2 19* 22 19*  GLUCOSE 159* 133* 147*  BUN 26* 26* 20  CREATININE 1.76* 1.66* 1.49*  CALCIUM 8.6* 8.6* 8.4*  GFRNONAA 33* 35* 40*  ANIONGAP '5 6 7     '$ Hematology Recent Labs  Lab 12/29/20 2116 12/30/20 0312 12/31/20 0021  WBC 6.5 5.1 6.0  RBC 3.47* 3.18* 3.29*  HGB 10.0* 9.4* 9.7*  HCT 31.7* 29.6* 30.2*  MCV 91.4 93.1 91.8  MCH 28.8 29.6 29.5  MCHC 31.5 31.8 32.1  RDW 14.0 14.2 14.6  PLT 222 214 236    BNPNo results for input(s): BNP, PROBNP in the last 168 hours.   DDimer  Recent Labs  Lab 12/30/20 0506  DDIMER 0.52*     Radiology    DG Chest 2 View  Result Date: 12/29/2020 CLINICAL DATA:  Chest pain EXAM: CHEST - 2 VIEW COMPARISON:  03/12/2020 FINDINGS: Prior CABG. Heart and mediastinal contours are within normal limits. No focal opacities or effusions. No acute bony abnormality. IMPRESSION: No active cardiopulmonary disease. Electronically Signed   By: Rolm Baptise M.D.   On: 12/29/2020 21:48   ECHOCARDIOGRAM COMPLETE  Result Date: 12/30/2020    ECHOCARDIOGRAM REPORT   Patient Name:   Meghan Terry Date of Exam: 12/30/2020 Medical Rec #:  [id number]    Height:       63.0 in Accession #:    [id number]   Weight:       157.0 lb Date of Birth:  1961/08/08     BSA:          1.745 m Patient Age:    60 years     BP:           128/72 mmHg Patient Gender: F            HR:           68 bpm. Exam Location:   Inpatient Procedure: 2D Echo Indications:    Chest pain  History:        Patient has prior history of Echocardiogram examinations, most                 recent 09/14/2019. Prior CABG; Risk Factors:Hypertension and                 Diabetes.  Sonographer:    Johny Chess Referring Phys: [place]  1. Left ventricular ejection fraction, by estimation, is 60 to 65%. The left ventricle has normal function. The left ventricle has no regional wall motion abnormalities. Left ventricular diastolic parameters were normal.  2. Right ventricular systolic function is normal. The right ventricular size is normal. There is normal pulmonary artery systolic pressure.  3. Left atrial size was mildly dilated.  4. The mitral valve is abnormal. Mild mitral valve regurgitation. No evidence of mitral stenosis.  5. The aortic valve is tricuspid. There is mild calcification of the aortic valve. Aortic valve regurgitation is not visualized. Mild to moderate aortic valve sclerosis/calcification is present, without any evidence of aortic stenosis.  6. The inferior vena cava is normal in size with greater than 50% respiratory variability, suggesting right atrial pressure of 3 mmHg. FINDINGS  Left Ventricle: Left ventricular ejection fraction, by estimation, is 60 to 65%. The left ventricle has normal function. The left ventricle has no regional wall motion abnormalities. The left ventricular internal cavity size was normal in size. There is  no left ventricular hypertrophy. Left ventricular diastolic parameters were normal. Right Ventricle: The right ventricular size is normal. No increase in right ventricular wall thickness. Right ventricular systolic function is normal. There is normal pulmonary artery systolic pressure. The tricuspid regurgitant velocity is 2.42 m/s, and  with an assumed right atrial pressure of 3 mmHg, the estimated right ventricular systolic pressure is 99991111 mmHg. Left Atrium: Left atrial size  was mildly dilated. Right Atrium: Right atrial size was normal in size. Pericardium: There is no evidence of pericardial effusion. Mitral Valve: The mitral valve is abnormal. There is mild  thickening of the mitral valve leaflet(s). There is mild calcification of the mitral valve leaflet(s). Mild mitral valve regurgitation. No evidence of mitral valve stenosis. Tricuspid Valve: The tricuspid valve is normal in structure. Tricuspid valve regurgitation is trivial. No evidence of tricuspid stenosis. Aortic Valve: The aortic valve is tricuspid. There is mild calcification of the aortic valve. Aortic valve regurgitation is not visualized. Mild to moderate aortic valve sclerosis/calcification is present, without any evidence of aortic stenosis. Pulmonic Valve: The pulmonic valve was normal in structure. Pulmonic valve regurgitation is not visualized. No evidence of pulmonic stenosis. Aorta: The aortic root is normal in size and structure. Venous: The inferior vena cava is normal in size with greater than 50% respiratory variability, suggesting right atrial pressure of 3 mmHg. IAS/Shunts: No atrial level shunt detected by color flow Doppler.  LEFT VENTRICLE PLAX 2D LVIDd:         4.60 cm  Diastology LVIDs:         3.00 cm  LV e' medial:    7.07 cm/s LV PW:         1.00 cm  LV E/e' medial:  14.1 LV IVS:        1.00 cm  LV e' lateral:   8.59 cm/s LVOT diam:     1.70 cm  LV E/e' lateral: 11.6 LV SV:         43 LV SV Index:   25 LVOT Area:     2.27 cm  RIGHT VENTRICLE             IVC RV S prime:     10.90 cm/s  IVC diam: 1.70 cm TAPSE (M-mode): 1.8 cm LEFT ATRIUM             Index       RIGHT ATRIUM           Index LA diam:        4.30 cm 2.46 cm/m  RA Area:     12.60 cm LA Vol (A2C):   40.3 ml 23.10 ml/m RA Volume:   29.20 ml  16.74 ml/m LA Vol (A4C):   36.1 ml 20.69 ml/m LA Biplane Vol: 38.4 ml 22.01 ml/m  AORTIC VALVE LVOT Vmax:   82.90 cm/s LVOT Vmean:  55.100 cm/s LVOT VTI:    0.190 m  AORTA Ao Root diam: 3.10 cm Ao  Asc diam:  2.90 cm MITRAL VALVE               TRICUSPID VALVE MV Area (PHT): 2.87 cm    TR Peak grad:   23.4 mmHg MV Decel Time: 264 msec    TR Vmax:        242.00 cm/s MV E velocity: 99.40 cm/s MV A velocity: 61.30 cm/s  SHUNTS MV E/A ratio:  1.62        Systemic VTI:  0.19 m                            Systemic Diam: 1.70 cm Jenkins Rouge MD Electronically signed by Jenkins Rouge MD Signature Date/Time: 12/30/2020/4:39:08 PM    Final     Cardiac Studies   Left heart catheterization 03/13/20:  Colon Flattery RCA to Prox RCA lesion is 30% stenosed.  Previously placed Prox RCA to Dist RCA stent (unknown type) is widely patent.  Previously placed RPAV stent (unknown type) is widely patent.  RPDA lesion is 100% stenosed.  Ost Cx to Prox Cx  lesion is 100% stenosed.  3rd Mrg lesion is 100% stenosed.  Mid Cx to Dist Cx lesion is 100% stenosed.  Origin to Prox Graft lesion is 75% stenosed.  Mid LAD-1 lesion is 75% stenosed.  Mid LAD-2 lesion is 95% stenosed.  IMPRESSION:Ms. Fender has a patent vein to the PDA which was subtotally occluded at the time of her last cath 4 months ago. I suspect this may been related to spasm. The previously placed stents by Dr. Claiborne Billings in the proximal mid and distal RCA and PLA branches are completely patent. The vein to the circumflex obtuse marginal branch has a moderately high-grade segmental ostial/proximal stenosis although the small circumflex marginal branch distal to the graft insertion has occluded since the last cath. The graft only supplies a small posterior lateral distal branch with retrograde filling. This only supplies a small amount of myocardium. The LIMA is intact. I reviewed the films with Dr. Claiborne Billings we both agree that medical therapy is the best option. I have communicated this to Dr. Marlou Porch as well. A right common femoral angiogram was performed and a Mynx closure device was successfully deployed achieving hemostasis. The patient left the lab in stable  condition.   Intervention   LHC 11/23/19:   Ost LAD to Prox LAD lesion is 70% stenosed.  Prox LAD to Mid LAD lesion is 50% stenosed with 50% stenosed side branch in 1st Diag.  Mid Cx lesion is 99% stenosed.  2nd Mrg-2 lesion is 95% stenosed with 0% stenosed side branch in Lat 2nd Mrg.  2nd Mrg-1 lesion is 95% stenosed.  RPAV lesion is 95% stenosed.  Prox RCA to Mid RCA lesion is 80% stenosed.  Dist RCA lesion is 80% stenosed with 70% stenosed side branch in RPAV.  Origin to Prox Graft lesion is 80% stenosed.  Prox Graft to Mid Graft lesion is 70% stenosed.  A stent was successfully placed.  Post intervention, there is a 0% residual stenosis.  Post intervention, the side branch was reduced to 0% residual stenosis.  Post intervention, there is a 0% residual stenosis.  Post intervention, there is a 0% residual stenosis.  A stent was successfully placed.  A stent was successfully placed.  Difficult but ultimately successful percutaneous coronary intervention to the native dominant RCA in a patient with documented diffuse disease in the vein graft supplying her PDA vessel.   A total of 3 stents were inserted with a 2.0 x 30 mm Resolute Onyx DES stent most distal with tandem overlap with a 2.0 x 12 mm stent to cover the 80 and 95% distal stenoses before the PDA and PLA vessels and of a 2.5 x 30 mm Resolute stent to cover the 80% stenosis in the mid vessel followed by an additional area of narrowing of 70% just proximal to the acute margin. Prior to stenting, there was suggestion of distal RCA dissection following very low level inflation with a 2.0 x 12 balloon at the distal 95% stenosis which was successfully treated with stent insertion. At the completion of the procedure, all areas were reduced to 0%. There was brisk TIMI-3 flow. There was no evidence for dissection. Competitive filling was present down the PDA due to the SVG supplying the mid PDA  region.  RECOMMENDATION: Long-term DAPT indefinitely. Hydration post procedure in this patient with baseline renal insufficiency. S of lipid-lowering therapy with target LDL less than 70. Concomitant medical therapy with nitrates, and a blocker, as well as ranolazine which was added yesterday for the patient's  distal circumflex disease. Patient will follow up with Dr. Geraldo Pitter.  Diagnostic Dominance: Right    Intervention     TTE 12/30/20: 1. Left ventricular ejection fraction, by estimation, is 60 to 65%. The  left ventricle has normal function. The left ventricle has no regional  wall motion abnormalities. Left ventricular diastolic parameters were  normal.  2. Right ventricular systolic function is normal. The right ventricular  size is normal. There is normal pulmonary artery systolic pressure.  3. Left atrial size was mildly dilated.  4. The mitral valve is abnormal. Mild mitral valve regurgitation. No  evidence of mitral stenosis.  5. The aortic valve is tricuspid. There is mild calcification of the  aortic valve. Aortic valve regurgitation is not visualized. Mild to  moderate aortic valve sclerosis/calcification is present, without any  evidence of aortic stenosis.  6. The inferior vena cava is normal in size with greater than 50%  respiratory variability, suggesting right atrial pressure of 3 mmHg.   Patient Profile     60 y.o. female with a history of CAD s/p CABG 08/2019 (LIMA to LAD, SVG to distal OM and SVG to PDA with early graft failure of the SVG to PDA), PCI to native RCA 09/2019, HTN, HLD, CKD stage III and DM2 who presented with chest pain and mildly elevated troponin for which Cardiology has been consulted.   Assessment & Plan    #Chest pain with hx of CAD s/p CABG: #NSTEMI: Pt [facility] with chest pain which began approximately 8am on Sunday morning. She stated that she was already awake for the day and was in her house when she  began having "crushing, severe" chest pain. Over the course of the day, she took 8 SL NTG tablets which wouls help temporarily however the pain would return. Has a complicated CAD hx with CABG x3 08/2019 with early graft failure with SVG to PDA requiring PCI to native RCA 1/202>> recommendations for medical management of LCx disease. She presented once again 02/2020 with chest pain with EKG showing no ischemia and negative HsT. Repeat LHC at that time showed patent stent in SVG to PDA, patent mid and distal RCA and PLA stents were patent, SVG to OM branch showed moderately high-grade stenosis with small OM branch distal to the graft showing a new occlusion since last cath which was too small for PCI, and patent LIMA. She was recommended for medical management. Given recurrent and persistent symptoms on this admission, will proceed with coronary angiography -Plan for cath today; currently NPO -Continue ASA, Plavix -Continue crestor '40mg'$  daily -Continue Ranexa and imdur -Change metop to long acting prior to discharge -TTE with normal BiV function, no significant WMA, mild MR -Cardiac catheterization was discussed with the patient fully. The patient understands that risks include but are not limited to stroke (1 in 1000), death (1 in 24), kidney failure [usually temporary] (1 in 500), bleeding (1 in 200), allergic reaction [possibly serious] (1 in 200). The patient understands and is willing to proceed.    #HTN: Controlled. -Continue amlodipine, metoprolol, and Imdur -Start ACE/ARB post-cath as above  #HLD: -Last LDL 61 11/2019 with LDL goal <70 -Continue rosuvastatin '40mg'$  daily.  #CKD stage III: Back to baseline at 1.5 today. -Trend -Prehydrated prior to cath  #Pre-DM2: -HbA1c, 5.0>>>well controlled  -Diet controlled   #Hypothyroidism: -TSH, 0.932 -Continue synthroid    INFORMED CONSENT: I have reviewed the risks, indications, and alternatives to cardiac catheterization,  possible angioplasty, and stenting with the  patient. Risks include but are not limited to bleeding, infection, vascular injury, stroke, myocardial infection, arrhythmia, kidney injury, radiation-related injury in the case of prolonged fluoroscopy use, emergency cardiac surgery, and death. The patient understands the risks of serious complication is 1-2 in [id number] with diagnostic cardiac cath and 1-2% or less with angioplasty/stenting.       For questions or updates, please contact [place] Please consult [email or web address] for contact info under        Signed, Freada Bergeron, MD  12/31/2020, 11:40 AM

## 2020-12-31 NOTE — CV Procedure (Signed)
   No change in anatomy when compared to June 2021.  The graft to the circumflex is patent.  Circumflex is small and supplies very minimal myocardium.  The graft to the PDA is patent and supplies the PDA which is diffusely diseased.  LIMA to the LAD is patent.  Native right coronary is extensively stented and is widely patent.  Stents extend across the origin of the PDA into the continuation of the right coronary.  No change from prior angiogram.  Native circumflex is totally occluded  LAD is diffusely diseased with 50 to 70% ostial to mid LAD stenosis.  This appearance is unchanged compared to 2021.  The left main is widely patent.  Left ventricle is hyperdynamic.  LVEDP is normal.

## 2020-12-31 NOTE — Plan of Care (Signed)

## 2020-12-31 NOTE — Progress Notes (Signed)
Pt refused to get her CBG checked. Pt stated that I don't know why you check CBG when I get admitted. My HbA1C is 6. She doesn't take any DM meds at home. Educated pt that we're just monitoring her blood sugar level.

## 2021-01-01 ENCOUNTER — Encounter (HOSPITAL_COMMUNITY): Payer: Self-pay | Admitting: Interventional Cardiology

## 2021-01-01 ENCOUNTER — Telehealth: Payer: Self-pay | Admitting: Cardiology

## 2021-01-01 ENCOUNTER — Other Ambulatory Visit (HOSPITAL_BASED_OUTPATIENT_CLINIC_OR_DEPARTMENT_OTHER): Payer: Self-pay

## 2021-01-01 DIAGNOSIS — Z951 Presence of aortocoronary bypass graft: Secondary | ICD-10-CM | POA: Diagnosis not present

## 2021-01-01 LAB — BASIC METABOLIC PANEL
Anion gap: 4 — ABNORMAL LOW (ref 5–15)
BUN: 15 mg/dL (ref 8–23)
CO2: 20 mmol/L — ABNORMAL LOW (ref 22–32)
Calcium: 8.3 mg/dL — ABNORMAL LOW (ref 8.9–10.3)
Chloride: 114 mmol/L — ABNORMAL HIGH (ref 98–111)
Creatinine, Ser: 1.46 mg/dL — ABNORMAL HIGH (ref 0.44–1.00)
GFR, Estimated: 41 mL/min — ABNORMAL LOW (ref >60)
Glucose, Bld: 185 mg/dL — ABNORMAL HIGH (ref 70–99)
Potassium: 3.7 mmol/L (ref 3.5–5.1)
Sodium: 138 mmol/L (ref 135–145)

## 2021-01-01 LAB — CBC
HCT: 30.7 % — ABNORMAL LOW (ref 36.0–46.0)
Hemoglobin: 9.7 g/dL — ABNORMAL LOW (ref 12.0–15.0)
MCH: 28.7 pg (ref 26.0–34.0)
MCHC: 31.6 g/dL (ref 30.0–36.0)
MCV: 90.8 fL (ref 80.0–100.0)
Platelets: 215 10*3/uL (ref 150–400)
RBC: 3.38 MIL/uL — ABNORMAL LOW (ref 3.87–5.11)
RDW: 14.4 % (ref 11.5–15.5)
WBC: 5.2 10*3/uL (ref 4.0–10.5)
nRBC: 0 % (ref 0.0–0.2)

## 2021-01-01 LAB — GLUCOSE, CAPILLARY: Glucose-Capillary: 142 mg/dL — ABNORMAL HIGH (ref 70–99)

## 2021-01-01 LAB — MAGNESIUM: Magnesium: 1.9 mg/dL (ref 1.7–2.4)

## 2021-01-01 LAB — PHOSPHORUS: Phosphorus: 3.4 mg/dL (ref 2.5–4.6)

## 2021-01-01 MED ORDER — ISOSORBIDE MONONITRATE ER 60 MG PO TB24: 60.0000 mg | ORAL_TABLET | Freq: Every day | ORAL | 1 refills | Status: DC

## 2021-01-01 MED ORDER — METOPROLOL SUCCINATE ER 50 MG PO TB24
50.0000 mg | ORAL_TABLET | Freq: Every day | ORAL | 1 refills | Status: DC
  Filled 2021-01-01: qty 30, 30d supply, fill #0
  Filled 2021-02-05: qty 30, 30d supply, fill #1

## 2021-01-01 MED ORDER — AMLODIPINE BESYLATE 2.5 MG PO TABS
2.5000 mg | ORAL_TABLET | Freq: Every day | ORAL | Status: DC
  Administered 2021-01-01: 2.5 mg via ORAL
  Filled 2021-01-01: qty 1

## 2021-01-01 MED ORDER — ISOSORBIDE MONONITRATE ER 60 MG PO TB24
60.0000 mg | ORAL_TABLET | Freq: Every day | ORAL | 1 refills | Status: DC
  Filled 2021-01-01: qty 30, 30d supply, fill #0
  Filled 2021-02-05: qty 30, 30d supply, fill #1

## 2021-01-01 MED ORDER — AMLODIPINE BESYLATE 2.5 MG PO TABS
2.5000 mg | ORAL_TABLET | Freq: Every day | ORAL | 1 refills | Status: DC
  Filled 2021-01-01: qty 30, 30d supply, fill #0
  Filled 2021-02-05: qty 30, 30d supply, fill #1

## 2021-01-01 MED ORDER — METOPROLOL SUCCINATE ER 50 MG PO TB24: 50.0000 mg | ORAL_TABLET | Freq: Every day | ORAL | 1 refills | Status: DC

## 2021-01-01 MED ORDER — AMLODIPINE BESYLATE 2.5 MG PO TABS: 2.5000 mg | ORAL_TABLET | Freq: Every day | ORAL | 1 refills | Status: DC

## 2021-01-01 MED ORDER — METOPROLOL SUCCINATE ER 25 MG PO TB24: 25.0000 mg | ORAL_TABLET | Freq: Every day | ORAL | Status: DC

## 2021-01-01 MED ORDER — METOPROLOL SUCCINATE ER 50 MG PO TB24: 50.0000 mg | ORAL_TABLET | Freq: Every day | ORAL | Status: DC

## 2021-01-01 MED ORDER — ISOSORBIDE MONONITRATE ER 60 MG PO TB24: 60.0000 mg | ORAL_TABLET | Freq: Every day | ORAL | Status: DC

## 2021-01-01 NOTE — Progress Notes (Signed)
Pt is alert and oriented. Discharge instructions/ AVS given to pt. 

## 2021-01-01 NOTE — Discharge Instructions (Addendum)
Chest Wall Pain Chest wall pain is pain in or around the bones and muscles of your chest. Chest wall pain may be caused by:  An injury.  Coughing a lot.  Using your chest and arm muscles too much. Sometimes, the cause may not be known. This pain may take a few weeks or longer to get better. Follow these instructions at home: Managing pain, stiffness, and swelling If told, put ice on the painful area:  Put ice in a plastic bag.  Place a towel between your skin and the bag.  Leave the ice on for 20 minutes, 2-3 times a day.   Activity  Rest as told by your doctor.  Avoid doing things that cause pain. This includes lifting heavy items.  Ask your doctor what activities are safe for you. General instructions  Take over-the-counter and prescription medicines only as told by your doctor.  Do not use any products that contain nicotine or tobacco, such as cigarettes, e-cigarettes, and chewing tobacco. If you need help quitting, ask your doctor.  Keep all follow-up visits as told by your doctor. This is important.   Contact a doctor if:  You have a fever.  Your chest pain gets worse.  You have new symptoms. Get help right away if:  You feel sick to your stomach (nauseous) or you throw up (vomit).  You feel sweaty or light-headed.  You have a cough with mucus from your lungs (sputum) or you cough up blood.  You are short of breath. These symptoms may be an emergency. Do not wait to see if the symptoms will go away. Get medical help right away. Call your local emergency services (911 in the [place] Do not drive yourself to the hospital. Summary  Chest wall pain is pain in or around the bones and muscles of your chest.  It may be treated with ice, rest, and medicines. Your condition may also get better if you avoid doing things that cause pain.  Contact a doctor if you have a fever, chest pain that gets worse, or new symptoms.  Get help right away if you feel  light-headed or you get short of breath. These symptoms may be an emergency. This information is not intended to replace advice given to you by your health care provider. Make sure you discuss any questions you have with your health care provider. Document Revised: 03/17/2018 Document Reviewed: 03/17/2018 Elsevier Patient Education  2021 [place].  Femoral Site Care  This sheet gives you information about how to care for yourself after your procedure. Your health care provider may also give you more specific instructions. If you have problems or questions, contact your health care provider. What can I expect after the procedure? After the procedure, it is common to have:  Bruising that usually fades within 1-2 weeks.  Tenderness at the site. Follow these instructions at home: Wound care  Follow instructions from your health care provider about how to take care of your insertion site. Make sure you: ? Wash your hands with soap and water before you change your bandage (dressing). If soap and water are not available, use hand sanitizer. ? Change your dressing as told by your health care provider. ? Leave stitches (sutures), skin glue, or adhesive strips in place. These skin closures may need to stay in place for 2 weeks or longer. If adhesive strip edges start to loosen and curl up, you may trim the loose edges. Do not remove adhesive strips completely unless  your health care provider tells you to do that.  Do not take baths, swim, or use a hot tub until your health care provider approves.  You may shower 24-48 hours after the procedure or as told by your health care provider. ? Gently wash the site with plain soap and water. ? Pat the area dry with a clean towel. ? Do not rub the site. This may cause bleeding.  Do not apply powder or lotion to the site. Keep the site clean and dry.  Check your femoral site every day for signs of infection. Check for: ? Redness, swelling, or  pain. ? Fluid or blood. ? Warmth. ? Pus or a bad smell. Activity  For the first 2-3 days after your procedure, or as long as directed: ? Avoid climbing stairs as much as possible. ? Do not squat.  Do not lift anything that is heavier than 10 lb (4.5 kg), or the limit that you are told, until your health care provider says that it is safe.  Rest as directed. ? Avoid sitting for a long time without moving. Get up to take short walks every 1-2 hours.  Do not drive for 24 hours if you were given a medicine to help you relax (sedative). General instructions  Take over-the-counter and prescription medicines only as told by your health care provider.  Keep all follow-up visits as told by your health care provider. This is important. Contact a health care provider if you have:  A fever or chills.  You have redness, swelling, or pain around your insertion site. Get help right away if:  The catheter insertion area swells very fast.  You pass out.  You suddenly start to sweat or your skin gets clammy.  The catheter insertion area is bleeding, and the bleeding does not stop when you hold steady pressure on the area.  The area near or just beyond the catheter insertion site becomes pale, cool, tingly, or numb. These symptoms may represent a serious problem that is an emergency. Do not wait to see if the symptoms will go away. Get medical help right away. Call your local emergency services (911 in the [place] Do not drive yourself to the hospital. Summary  After the procedure, it is common to have bruising that usually fades within 1-2 weeks.  Check your femoral site every day for signs of infection.  Do not lift anything that is heavier than 10 lb (4.5 kg), or the limit that you are told, until your health care provider says that it is safe. This information is not intended to replace advice given to you by your health care provider. Make sure you discuss any questions you have with  your health care provider. Document Revised: 05/17/2020 Document Reviewed: 05/17/2020 Elsevier Patient Education  2021 [place] to follow-up with primary care physician in 1 week. Advised to follow-up with cardiology as scheduled in 1 week. Advised to take dual antiplatelet agent uninterrupted for 12 months.   Patient was admitted for chest pain underwent left heart cath found to have unchanged left heart cath from last time. Patient's Imdur was increased from 30 to 60 mg daily.

## 2021-01-01 NOTE — Discharge Summary (Signed)
Physician Discharge Summary  Meghan Terry [id number] DOB: 1961-06-04 DOA: 12/29/2020  PCP: Garwin Brothers, MD  Admit date: 12/29/2020   Discharge date: 01/01/2021  Admitted From: Home.  Disposition:  Home.  Recommendations for Outpatient Follow-up:  1. Follow up with PCP in 1-2 weeks. 2. Please obtain BMP/CBC in one week. 3. Advised to follow-up with cardiology as scheduled in 1 week. 4. Advised to take dual antiplatelet agent uninterrupted for 12 months.   5. Patient was admitted for chest pain, underwent left heart cath, found to have unchanged left heart cath findings from last time. 6. Patient's Imdur was increased from 30 to 60 mg daily.  Home Health: None Equipment/Devices: None  Discharge Condition: Stable. CODE STATUS:Full code Diet recommendation: Heart Healthy   Brief Summary: This 60 years old female with PMH significant for CAD s/p CABG, chronic kidney disease stage IIIb, history of diabetes, anxiety,depression, hypertension, hypothyroidism, migraine presents in the ER with complaints of chest pain. Patient reports that her daughter just got married 2 days ago and she was taking care of her daughter's kids and got exhausted. She has developed chest pain, so far she had taken 8 nitro sublingual withtemporary relief. She denies any associated shortness of breath, diaphoresis, abdominal pain, nausea, vomiting or diarrhea. In the ER EKG shows sinus tachycardia, high sensitive troponins were trending up. Cardiology was consulted.   Hospital course:  This patient was admitted for chest pain with a history of CAD s/p CABG concerning for NSTEMI.  Given recurrent and persistent symptoms cardiology had decided to proceed with coronary angiography.  Patient was continued on aspirin and Plavix, Crestor, metoprolol.  TTE showed normal biventricular function without significant wall motion abnormalities, mild MR.  Patient underwent left heart cath with stable disease with no change from  prior cath.  Cardiology recommended to continue medical management.  Patient feels better, chest pain has resolved.  Medications changed, started on amlodipine 2.5 mg daily for possible vasospasm,  Imdur was increased from 30 to 60 mg daily.  Patient will continue aspirin and Plavix,  Ranexa twice daily,  metoprolol 50 mg daily.  Patient will follow up with cardiology in 1 week.  Discharge medications: Aspirin 81 mg daily Plavix 75 mg daily Ranexa 1000 mg twice daily  Imdur 60 mg daily Metoprolol 50 mg daily Amlodipine 2.5 mg daily for possible vasospasm. Crestor 40 mg daily   She was managed for below problems.   Discharge Diagnoses:  Principal Problem:   S/P CABG x 3 Active Problems:   Essential hypertension   Diabetes mellitus due to underlying condition with unspecified complications (HCC)   Hyperlipidemia with target LDL less than 70   CKD (chronic kidney disease) stage 3b, GFR 30-59 ml/min   Atypical chest pain  Chest pain with history of CAD: Patient status post CABG reports chest pain concerning for angina.  Troponin trending up.  EKG : No ST-T wave changes. Continue aspirin, Plavix, statins ,Ranexa, Imdur beta-blockers.  Cardiology consulted Consult cardiology.  Given high risk CAD history recommended left heart cath. 2D Echo: LVEF 60 to 65%, no regional wall motion abnormalities. Started on heparin GTT. Patient is scheduled to have left heart cath today 4/5  Diabetes mellitus: Patient is not on any antidiabetic medication. Follow blood sugars and sliding scale if needed,  CKD stage IIIa: creatinine seems to be at baseline.   Monitor renal functions, avoid nephrotoxic medications.  Hypokalemia: Replaced and improved.  Hypothyroidism : Continue levothyroxine.  Chronic anemia : H&H remained stable.  Discharge Instructions  Discharge Instructions    Call MD for:  difficulty breathing, headache or visual disturbances   Complete by: As directed    Call  MD for:  persistant dizziness or light-headedness   Complete by: As directed    Call MD for:  persistant nausea and vomiting   Complete by: As directed    Diet - low sodium heart healthy   Complete by: As directed    Diet Carb Modified   Complete by: As directed    Discharge instructions   Complete by: As directed    Advised to follow-up with primary care physician in 1 week. Advised to follow-up with cardiology as scheduled in 1 week. Advised to take dual antiplatelet agent uninterrupted for 12 months.  Patient was admitted for chest pain underwent left heart cath found to have unchanged left heart cath from last time. Patient's Imdur was increased from 30 to 60 mg daily.   Increase activity slowly   Complete by: As directed      Allergies as of 01/01/2021      Reactions   Carbidopa-levodopa Other (See Comments)   Developed tics while taking   Prednisone Other (See Comments)   Turns red all over   Venlafaxine Other (See Comments)   Developed tics while taking      Medication List    STOP taking these medications   amoxicillin-clavulanate 875-125 MG tablet Commonly known as: AUGMENTIN   Farxiga 10 MG Tabs tablet Generic drug: dapagliflozin propanediol   Saxenda 18 MG/3ML Sopn Generic drug: Liraglutide -Weight Management     TAKE these medications   acetaminophen 500 MG tablet Commonly known as: TYLENOL Take 500-1,000 mg by mouth every 6 (six) hours as needed (for pain.).   amLODipine 2.5 MG tablet Commonly known as: NORVASC Take 1 tablet (2.5 mg total) by mouth daily.   aspirin EC 81 MG tablet Take 1 tablet (81 mg total) by mouth daily.   buPROPion 300 MG 24 hr tablet Commonly known as: WELLBUTRIN XL TAKE 1 TABLET BY MOUTH DAILY What changed: how much to take   clopidogrel 75 MG tablet Commonly known as: PLAVIX TAKE ONE TABLET BY MOUTH DAILY What changed: how much to take   ferrous sulfate 325 (65 FE) MG tablet Take 1 tablet (325 mg total) by mouth daily  with breakfast.   Generlac 10 GM/15ML Soln Generic drug: lactulose (encephalopathy) TAKE 30 MLS (20 GRAM) BY MOUTH 3 TIMES PER DAY FOR 3 DAYS AS NEEDED FOR CONSTIPATION What changed:   how much to take  how to take this  when to take this  reasons to take this   isosorbide mononitrate 60 MG 24 hr tablet Commonly known as: IMDUR Take 1 tablet (60 mg total) by mouth daily. Start taking on: January 02, 2021 What changed:   medication strength  how much to take   levothyroxine 50 MCG tablet Commonly known as: SYNTHROID TAKE 1 TABLET BY MOUTH ONCE DAILY ON AN EMPTY STOMACH 30 MINUTES BEFORE BREAKFAST ON SATURDAY AND SUNDAY What changed:   how much to take  how to take this  when to take this  additional instructions   levothyroxine 75 MCG tablet Commonly known as: SYNTHROID TAKE 1 TABLET BY MOUTH ONCE DAILY [facility] What changed:   how much to take  how to take this  when to take this  additional instructions   metoprolol succinate 50 MG 24 hr tablet Commonly known as: TOPROL-XL Take 1 tablet (50  mg total) by mouth at bedtime. Take with or immediately following a meal.   metoprolol tartrate 50 MG tablet Commonly known as: LOPRESSOR TAKE 1 TABLET BY MOUTH TWICE DAILY WITH MEALS What changed: how much to take   nitroGLYCERIN 0.4 MG SL tablet Commonly known as: NITROSTAT DISSOLVE ONE TABLET UNDER TONGUE EVERY 5 MINUTES AS NEEDED FOR CHEST PAIN What changed:   how much to take  how to take this  when to take this  reasons to take this   ondansetron 4 MG tablet Commonly known as: ZOFRAN TAKE 1 TABLET (4 MG) BY MOUTH TWICE DAILY AS NEEDED FOR NAUSEA What changed:   how much to take  how to take this  when to take this  reasons to take this   ranolazine 1000 MG SR tablet Commonly known as: RANEXA TAKE ONE TABLET BY MOUTH TWICE DAILY What changed: how much to take   rOPINIRole 4 MG tablet Commonly known as: REQUIP TAKE 1 TABLET  BY MOUTH 1 - 3 HOURS BEFORE BEDTIME What changed:   how much to take  how to take this  when to take this  additional instructions   rOPINIRole 1 MG tablet Commonly known as: REQUIP TAKE 1 TABLET BY MOUTH 3 TIMES DAILY What changed: how much to take   rosuvastatin 40 MG tablet Commonly known as: CRESTOR TAKE ONE TABLET BY MOUTH AT BEDTIME What changed: how much to take   topiramate 100 MG tablet Commonly known as: TOPAMAX TAKE 1 TABLET BY MOUTH 2 TIMES DAILY What changed: how much to take   traMADol 50 MG tablet Commonly known as: ULTRAM Take 1 tablet (50 mg total) by mouth every 12 (twelve) hours as needed (pain).   traZODone 150 MG tablet Commonly known as: DESYREL TAKE 1 TABLET BY MOUTH ONCE DAILY What changed: how much to take       Follow-up Information    Revankar, Reita Cliche, MD Follow up on 01/27/2021.   Specialty: Cardiology Why: Please arrive 15 minutes early for your 10am post-hospital cardiology appointment. Please note, this is at our [place] location Contact information: [address] [place]  [place] [address] [phone]        Garwin Brothers, MD Follow up in 1 week(s).   Specialty: Internal Medicine Contact information: [address] [place] [address] [phone]        RevankarReita Cliche, MD .   Specialty: Cardiology Contact information: [address] [place] [address] [phone]              Allergies  Allergen Reactions  . Carbidopa-Levodopa Other (See Comments)    Developed tics while taking   . Prednisone Other (See Comments)    Turns red all over   . Venlafaxine Other (See Comments)    Developed tics while taking     Consultations:  Cardiology   Procedures/Studies: DG Chest 2 View  Result Date: 12/29/2020 CLINICAL DATA:  Chest pain EXAM: CHEST - 2 VIEW COMPARISON:  03/12/2020 FINDINGS: Prior CABG. Heart and mediastinal contours are within normal limits. No focal opacities  or effusions. No acute bony abnormality. IMPRESSION: No active cardiopulmonary disease. Electronically Signed   By: Rolm Baptise M.D.   On: 12/29/2020 21:48   CARDIAC CATHETERIZATION  Result Date: 12/31/2020  Overall, no significant change when compared to angiography performed in June 2021.  LIMA to LAD is patent.  Bypass graft to circumflex is patent.  Distribution of  native vessel after the insertion is very small.  The native vessel diameter is minuscule.  Bypass graft to PDA is patent.  PDA is totally occluded at its ostium and is jailed by stent.  Left main is widely patent.  LAD is severely and diffusely diseased with 70 to 80% stenosis from proximal to mid.  Competitive flow is noted in the mid LAD due to LIMA graft insertion.  Circumflex is totally occluded in the mid vessel.  Very faint collaterals are noted to small "threadlike vessels".  Right coronary is widely patent and has a heavy burden of stents that extends from the mid vessel into the continuation beyond the origin of the PDA.  The PDA is jailed.  No significant obstruction is noted in the right coronary other than the PDA which is supplied by a graft.  Hyperdynamic left ventricle.  EF greater than 70%.  LVEDP is normal. Recommendations:  Risk factor modification.  Uptitrate anti-ischemic therapy as needed to control angina.  Anatomy is stable over the past 10 months.  ECHOCARDIOGRAM COMPLETE  Result Date: 12/30/2020    ECHOCARDIOGRAM REPORT   Patient Name:   Meghan Terry Date of Exam: 12/30/2020 Medical Rec #:  [id number]    Height:       63.0 in Accession #:    [id number]   Weight:       157.0 lb Date of Birth:  1961/05/25     BSA:          1.745 m Patient Age:    63 years     BP:           128/72 mmHg Patient Gender: F            HR:           68 bpm. Exam Location:  Inpatient Procedure: 2D Echo Indications:    Chest pain  History:        Patient has prior history of Echocardiogram examinations, most                 recent  09/14/2019. Prior CABG; Risk Factors:Hypertension and                 Diabetes.  Sonographer:    Johny Chess Referring Phys: [place]  1. Left ventricular ejection fraction, by estimation, is 60 to 65%. The left ventricle has normal function. The left ventricle has no regional wall motion abnormalities. Left ventricular diastolic parameters were normal.  2. Right ventricular systolic function is normal. The right ventricular size is normal. There is normal pulmonary artery systolic pressure.  3. Left atrial size was mildly dilated.  4. The mitral valve is abnormal. Mild mitral valve regurgitation. No evidence of mitral stenosis.  5. The aortic valve is tricuspid. There is mild calcification of the aortic valve. Aortic valve regurgitation is not visualized. Mild to moderate aortic valve sclerosis/calcification is present, without any evidence of aortic stenosis.  6. The inferior vena cava is normal in size with greater than 50% respiratory variability, suggesting right atrial pressure of 3 mmHg. FINDINGS  Left Ventricle: Left ventricular ejection fraction, by estimation, is 60 to 65%. The left ventricle has normal function. The left ventricle has no regional wall motion abnormalities. The left ventricular internal cavity size was normal in size. There is  no left ventricular hypertrophy. Left ventricular diastolic parameters were normal. Right Ventricle: The right ventricular size is normal. No increase in right ventricular wall thickness. Right ventricular  systolic function is normal. There is normal pulmonary artery systolic pressure. The tricuspid regurgitant velocity is 2.42 m/s, and  with an assumed right atrial pressure of 3 mmHg, the estimated right ventricular systolic pressure is [id number] mmHg. Left Atrium: Left atrial size was mildly dilated. Right Atrium: Right atrial size was normal in size. Pericardium: There is no evidence of pericardial effusion. Mitral Valve: The mitral  valve is abnormal. There is mild thickening of the mitral valve leaflet(s). There is mild calcification of the mitral valve leaflet(s). Mild mitral valve regurgitation. No evidence of mitral valve stenosis. Tricuspid Valve: The tricuspid valve is normal in structure. Tricuspid valve regurgitation is trivial. No evidence of tricuspid stenosis. Aortic Valve: The aortic valve is tricuspid. There is mild calcification of the aortic valve. Aortic valve regurgitation is not visualized. Mild to moderate aortic valve sclerosis/calcification is present, without any evidence of aortic stenosis. Pulmonic Valve: The pulmonic valve was normal in structure. Pulmonic valve regurgitation is not visualized. No evidence of pulmonic stenosis. Aorta: The aortic root is normal in size and structure. Venous: The inferior vena cava is normal in size with greater than 50% respiratory variability, suggesting right atrial pressure of 3 mmHg. IAS/Shunts: No atrial level shunt detected by color flow Doppler.  LEFT VENTRICLE PLAX 2D LVIDd:         4.60 cm  Diastology LVIDs:         3.00 cm  LV e' medial:    7.07 cm/s LV PW:         1.00 cm  LV E/e' medial:  14.1 LV IVS:        1.00 cm  LV e' lateral:   8.59 cm/s LVOT diam:     1.70 cm  LV E/e' lateral: 11.6 LV SV:         43 LV SV Index:   25 LVOT Area:     2.27 cm  RIGHT VENTRICLE             IVC RV S prime:     10.90 cm/s  IVC diam: 1.70 cm TAPSE (M-mode): 1.8 cm LEFT ATRIUM             Index       RIGHT ATRIUM           Index LA diam:        4.30 cm 2.46 cm/m  RA Area:     12.60 cm LA Vol (A2C):   40.3 ml 23.10 ml/m RA Volume:   29.20 ml  16.74 ml/m LA Vol (A4C):   36.1 ml 20.69 ml/m LA Biplane Vol: 38.4 ml 22.01 ml/m  AORTIC VALVE LVOT Vmax:   82.90 cm/s LVOT Vmean:  55.100 cm/s LVOT VTI:    0.190 m  AORTA Ao Root diam: 3.10 cm Ao Asc diam:  2.90 cm MITRAL VALVE               TRICUSPID VALVE MV Area (PHT): 2.87 cm    TR Peak grad:   23.4 mmHg MV Decel Time: 264 msec    TR Vmax:         242.00 cm/s MV E velocity: 99.40 cm/s MV A velocity: 61.30 cm/s  SHUNTS MV E/A ratio:  1.62        Systemic VTI:  0.19 m                            Systemic Diam: 1.70 cm Collier Salina  Johnsie Cancel MD Electronically signed by Jenkins Rouge MD Signature Date/Time: 12/30/2020/4:39:08 PM    Final    Echocardiogram, left heart cath.   Subjective: Patient was seen and examined at bedside.  Overnight events noted.  Patient underwent left heart cath yesterday,  found to have no new changes compared to the previous left heart cath.  Patient feels better , chest pain is resolved Patient wants to be discharged.  Discharge Exam: Vitals:   01/01/21 0846 01/01/21 1128  BP: 137/62 123/64  Pulse: 73   Resp: 18   Temp: 98.5 F (36.9 C)   SpO2: 96%    Vitals:   12/31/20 1800 01/01/21 0500 01/01/21 0846 01/01/21 1128  BP: 132/63 (!) 153/75 137/62 123/64  Pulse: 76 82 73   Resp: '18 18 18   '$ Temp: 97.7 F (36.5 C) 98.1 F (36.7 C) 98.5 F (36.9 C)   TempSrc: Oral Oral Oral   SpO2: 100% 100% 96%   Weight:  73.7 kg    Height:        General: Pt is alert, awake, not in acute distress Cardiovascular: RRR, S1/S2 +, no rubs, no gallops Respiratory: CTA bilaterally, no wheezing, no rhonchi Abdominal: Soft, NT, ND, bowel sounds + Extremities: no edema, no cyanosis    The results of significant diagnostics from this hospitalization (including imaging, microbiology, ancillary and laboratory) are listed below for reference.     Microbiology: Recent Results (from the past 240 hour(s))  Resp Panel by RT-PCR (Flu A&B, Covid) Nasopharyngeal Swab     Status: None   Collection Time: 12/30/20  1:14 AM   Specimen: Nasopharyngeal Swab; Nasopharyngeal(NP) swabs in vial transport medium  Result Value Ref Range Status   SARS Coronavirus 2 by RT PCR NEGATIVE NEGATIVE Final    Comment: (NOTE) SARS-CoV-2 target nucleic acids are NOT DETECTED.  The SARS-CoV-2 RNA is generally detectable in upper respiratory specimens during  the acute phase of infection. The lowest concentration of SARS-CoV-2 viral copies this assay can detect is 138 copies/mL. A negative result does not preclude SARS-Cov-2 infection and should not be used as the sole basis for treatment or other patient management decisions. A negative result may occur with  improper specimen collection/handling, submission of specimen other than nasopharyngeal swab, presence of viral mutation(s) within the areas targeted by this assay, and inadequate number of viral copies(<138 copies/mL). A negative result must be combined with clinical observations, patient history, and epidemiological information. The expected result is Negative.  Fact Sheet for Patients:  EntrepreneurPulse.com.au  Fact Sheet for Healthcare Providers:  IncredibleEmployment.be  This test is no t yet approved or cleared by the [place] FDA and  has been authorized for detection and/or diagnosis of SARS-CoV-2 by FDA under an Emergency Use Authorization (EUA). This EUA will remain  in effect (meaning this test can be used) for the duration of the COVID-19 declaration under Section 564(b)(1) of the Act, 21 U.S.C.section 360bbb-3(b)(1), unless the authorization is terminated  or revoked sooner.       Influenza A by PCR NEGATIVE NEGATIVE Final   Influenza B by PCR NEGATIVE NEGATIVE Final    Comment: (NOTE) The Xpert Xpress SARS-CoV-2/FLU/RSV plus assay is intended as an aid in the diagnosis of influenza from Nasopharyngeal swab specimens and should not be used as a sole basis for treatment. Nasal washings and aspirates are unacceptable for Xpert Xpress SARS-CoV-2/FLU/RSV testing.  Fact Sheet for Patients: EntrepreneurPulse.com.au  Fact Sheet for Healthcare Providers: IncredibleEmployment.be  This test is not yet approved  or cleared by the [place] and has been authorized for detection and/or  diagnosis of SARS-CoV-2 by FDA under an Emergency Use Authorization (EUA). This EUA will remain in effect (meaning this test can be used) for the duration of the COVID-19 declaration under Section 564(b)(1) of the Act, 21 U.S.C. section 360bbb-3(b)(1), unless the authorization is terminated or revoked.  Performed at [facility], [place] [address] [place], [place] [address]      Labs: BNP (last 3 results) No results for input(s): BNP in the last 8760 hours. Basic Metabolic Panel: Recent Labs  Lab 12/29/20 2116 12/30/20 0312 12/31/20 0021 01/01/21 0307  NA 139 142 139 138  K 3.3* 3.5 3.7 3.7  CL 115* 114* 113* 114*  CO2 19* 22 19* 20*  GLUCOSE 159* 133* 147* 185*  BUN 26* 26* 20 15  CREATININE 1.76* 1.66* 1.49* 1.46*  CALCIUM 8.6* 8.6* 8.4* 8.3*  MG  --   --  1.9 1.9  PHOS  --   --  3.4 3.4   Liver Function Tests: No results for input(s): AST, ALT, ALKPHOS, BILITOT, PROT, ALBUMIN in the last 168 hours. No results for input(s): LIPASE, AMYLASE in the last 168 hours. No results for input(s): AMMONIA in the last 168 hours. CBC: Recent Labs  Lab 12/29/20 2116 12/30/20 0312 12/31/20 0021 01/01/21 0307  WBC 6.5 5.1 6.0 5.2  HGB 10.0* 9.4* 9.7* 9.7*  HCT 31.7* 29.6* 30.2* 30.7*  MCV 91.4 93.1 91.8 90.8  PLT 222 214 236 215   Cardiac Enzymes: No results for input(s): CKTOTAL, CKMB, CKMBINDEX, TROPONINI in the last 168 hours. BNP: Invalid input(s): POCBNP CBG: Recent Labs  Lab 12/30/20 2141 12/31/20 0627 12/31/20 1043 12/31/20 2202 01/01/21 0815  GLUCAP 90 124* 136* 165* 142*   D-Dimer Recent Labs    12/30/20 0506  DDIMER 0.52*   Hgb A1c No results for input(s): HGBA1C in the last 72 hours. Lipid Profile No results for input(s): CHOL, HDL, LDLCALC, TRIG, CHOLHDL, LDLDIRECT in the last 72 hours. Thyroid function studies No results for input(s): TSH, T4TOTAL, T3FREE, THYROIDAB in the last 72 hours.  Invalid input(s): FREET3 Anemia work up No  results for input(s): VITAMINB12, FOLATE, FERRITIN, TIBC, IRON, RETICCTPCT in the last 72 hours. Urinalysis    Component Value Date/Time   COLORURINE STRAW (A) 09/14/2019 0005   APPEARANCEUR CLEAR 09/14/2019 0005   LABSPEC 1.008 09/14/2019 0005   PHURINE 7.0 09/14/2019 0005   GLUCOSEU NEGATIVE 09/14/2019 0005   HGBUR NEGATIVE 09/14/2019 0005   BILIRUBINUR NEGATIVE 09/14/2019 0005   KETONESUR NEGATIVE 09/14/2019 0005   PROTEINUR NEGATIVE 09/14/2019 0005   NITRITE NEGATIVE 09/14/2019 0005   LEUKOCYTESUR TRACE (A) 09/14/2019 0005   Sepsis Labs Invalid input(s): PROCALCITONIN,  WBC,  LACTICIDVEN Microbiology Recent Results (from the past 240 hour(s))  Resp Panel by RT-PCR (Flu A&B, Covid) Nasopharyngeal Swab     Status: None   Collection Time: 12/30/20  1:14 AM   Specimen: Nasopharyngeal Swab; Nasopharyngeal(NP) swabs in vial transport medium  Result Value Ref Range Status   SARS Coronavirus 2 by RT PCR NEGATIVE NEGATIVE Final    Comment: (NOTE) SARS-CoV-2 target nucleic acids are NOT DETECTED.  The SARS-CoV-2 RNA is generally detectable in upper respiratory specimens during the acute phase of infection. The lowest concentration of SARS-CoV-2 viral copies this assay can detect is 138 copies/mL. A negative result does not preclude SARS-Cov-2 infection and should not be used as the sole basis for treatment or other patient  management decisions. A negative result may occur with  improper specimen collection/handling, submission of specimen other than nasopharyngeal swab, presence of viral mutation(s) within the areas targeted by this assay, and inadequate number of viral copies(<138 copies/mL). A negative result must be combined with clinical observations, patient history, and epidemiological information. The expected result is Negative.  Fact Sheet for Patients:  EntrepreneurPulse.com.au  Fact Sheet for Healthcare Providers:   IncredibleEmployment.be  This test is no t yet approved or cleared by the [place] FDA and  has been authorized for detection and/or diagnosis of SARS-CoV-2 by FDA under an Emergency Use Authorization (EUA). This EUA will remain  in effect (meaning this test can be used) for the duration of the COVID-19 declaration under Section 564(b)(1) of the Act, 21 U.S.C.section 360bbb-3(b)(1), unless the authorization is terminated  or revoked sooner.       Influenza A by PCR NEGATIVE NEGATIVE Final   Influenza B by PCR NEGATIVE NEGATIVE Final    Comment: (NOTE) The Xpert Xpress SARS-CoV-2/FLU/RSV plus assay is intended as an aid in the diagnosis of influenza from Nasopharyngeal swab specimens and should not be used as a sole basis for treatment. Nasal washings and aspirates are unacceptable for Xpert Xpress SARS-CoV-2/FLU/RSV testing.  Fact Sheet for Patients: EntrepreneurPulse.com.au  Fact Sheet for Healthcare Providers: IncredibleEmployment.be  This test is not yet approved or cleared by the [place] FDA and has been authorized for detection and/or diagnosis of SARS-CoV-2 by FDA under an Emergency Use Authorization (EUA). This EUA will remain in effect (meaning this test can be used) for the duration of the COVID-19 declaration under Section 564(b)(1) of the Act, 21 U.S.C. section 360bbb-3(b)(1), unless the authorization is terminated or revoked.  Performed at [facility], [place] [address], [place], [place] [address]      Time coordinating discharge: Over 30 minutes  SIGNED:   Shawna Clamp, MD  [facility] 01/01/2021, 2:02 PM Pager   If 7PM-7AM, please contact night-coverage [email or web address]

## 2021-01-01 NOTE — Progress Notes (Addendum)
Progress Note  Patient Name: Meghan Terry Date of Encounter: 01/01/2021  [facility] Cardiologist: Jenean Lindau, MD   Subjective   Feeling good this morning. No chest pain or SOB overnight. No complaints of palpitations or LE edema. She reports she has been on imdur '60mg'$  daily in the past and tolerated without issue, though does not recall why the dose was reduced.   Inpatient Medications    Scheduled Meds: . aspirin  324 mg Oral Once  . aspirin EC  81 mg Oral Daily  . buPROPion  300 mg Oral Daily  . clopidogrel  75 mg Oral Daily  . ferrous sulfate  325 mg Oral Q breakfast  . heparin  5,000 Units Subcutaneous Q8H  . isosorbide mononitrate  30 mg Oral Daily  . [START ON 01/04/2021] levothyroxine  50 mcg Oral Once per day on Sun Sat  . levothyroxine  75 mcg Oral Once per day on Mon Tue Wed Thu Fri  . metoprolol tartrate  50 mg Oral BID WC  . ranolazine  1,000 mg Oral BID  . rOPINIRole  1 mg Oral QID  . rosuvastatin  40 mg Oral QHS  . sodium chloride flush  3 mL Intravenous Q12H  . topiramate  100 mg Oral BID  . traZODone  150 mg Oral QHS   Continuous Infusions: . sodium chloride 30 mL/hr at 12/30/20 2302  . sodium chloride     PRN Meds: sodium chloride, acetaminophen **OR** acetaminophen, lactulose, nitroGLYCERIN, ondansetron (ZOFRAN) IV, oxyCODONE, sodium chloride flush, traMADol   Vital Signs    Vitals:   12/31/20 1734 12/31/20 1800 01/01/21 0500 01/01/21 0846  BP: (!) 108/52 132/63 (!) 153/75 137/62  Pulse: 67 76 82 73  Resp: '17 18 18 18  '$ Temp:  97.7 F (36.5 C) 98.1 F (36.7 C) 98.5 F (36.9 C)  TempSrc:  Oral Oral Oral  SpO2: 100% 100% 100% 96%  Weight:   73.7 kg   Height:        Intake/Output Summary (Last 24 hours) at 01/01/2021 0921 Last data filed at 01/01/2021 0800 Gross per 24 hour  Intake 1417.17 ml  Output --  Net 1417.17 ml   Last 3 Weights 01/01/2021 12/29/2020 05/07/2020  Weight (lbs) 162 lb 6.4 oz 157 lb 147 lb 6.4 oz  Weight (kg) 73.664 kg  71.215 kg 66.86 kg  Some encounter information is confidential and restricted. Go to Review Flowsheets activity to see all data.      Telemetry    Sinus rhythm - Personally Reviewed  ECG    No new tracings - Personally Reviewed  Physical Exam   GEN: Sitting upright in bed in no acute distress.   Neck: No JVD Cardiac: RRR, no murmurs, rubs, or gallops. Right groin cath site shows some ecchymosis without hematoma, dressing C/D/I Respiratory: Clear to auscultation bilaterally. GI: Soft, nontender, non-distended  MS: No edema; No deformity. Neuro:  Nonfocal  Psych: Normal affect   Labs    High Sensitivity Troponin:   Recent Labs  Lab 12/29/20 2116 12/29/20 2337 12/30/20 0312 12/30/20 0654  TROPONINIHS 7 19* 72* 74*      Chemistry Recent Labs  Lab 12/30/20 0312 12/31/20 0021 01/01/21 0307  NA 142 139 138  K 3.5 3.7 3.7  CL 114* 113* 114*  CO2 22 19* 20*  GLUCOSE 133* 147* 185*  BUN 26* 20 15  CREATININE 1.66* 1.49* 1.46*  CALCIUM 8.6* 8.4* 8.3*  GFRNONAA 35* 40* 41*  ANIONGAP 6  7 4*     Hematology Recent Labs  Lab 12/30/20 0312 12/31/20 0021 01/01/21 0307  WBC 5.1 6.0 5.2  RBC 3.18* 3.29* 3.38*  HGB 9.4* 9.7* 9.7*  HCT 29.6* 30.2* 30.7*  MCV 93.1 91.8 90.8  MCH 29.6 29.5 28.7  MCHC 31.8 32.1 31.6  RDW 14.2 14.6 14.4  PLT 214 236 215    BNPNo results for input(s): BNP, PROBNP in the last 168 hours.   DDimer  Recent Labs  Lab 12/30/20 0506  DDIMER 0.52*     Radiology    CARDIAC CATHETERIZATION  Result Date: 12/31/2020  Overall, no significant change when compared to angiography performed in June 2021.  LIMA to LAD is patent.  Bypass graft to circumflex is patent.  Distribution of native vessel after the insertion is very small.  The native vessel diameter is minuscule.  Bypass graft to PDA is patent.  PDA is totally occluded at its ostium and is jailed by stent.  Left main is widely patent.  LAD is severely and diffusely diseased with  70 to 80% stenosis from proximal to mid.  Competitive flow is noted in the mid LAD due to LIMA graft insertion.  Circumflex is totally occluded in the mid vessel.  Very faint collaterals are noted to small "threadlike vessels".  Right coronary is widely patent and has a heavy burden of stents that extends from the mid vessel into the continuation beyond the origin of the PDA.  The PDA is jailed.  No significant obstruction is noted in the right coronary other than the PDA which is supplied by a graft.  Hyperdynamic left ventricle.  EF greater than 70%.  LVEDP is normal. Recommendations:  Risk factor modification.  Uptitrate anti-ischemic therapy as needed to control angina.  Anatomy is stable over the past 10 months.  ECHOCARDIOGRAM COMPLETE  Result Date: 12/30/2020    ECHOCARDIOGRAM REPORT   Patient Name:   [name] Date of Exam: 12/30/2020 Medical Rec #:  [id number]    Height:       63.0 in Accession #:    [id number]   Weight:       157.0 lb Date of Birth:  Aug 04, 1961     BSA:          1.745 m Patient Age:    60 years     BP:           128/72 mmHg Patient Gender: F            HR:           68 bpm. Exam Location:  Inpatient Procedure: 2D Echo Indications:    Chest pain  History:        Patient has prior history of Echocardiogram examinations, most                 recent 09/14/2019. Prior CABG; Risk Factors:Hypertension and                 Diabetes.  Sonographer:    Johny Chess Referring Phys: [facility]  1. Left ventricular ejection fraction, by estimation, is 60 to 65%. The left ventricle has normal function. The left ventricle has no regional wall motion abnormalities. Left ventricular diastolic parameters were normal.  2. Right ventricular systolic function is normal. The right ventricular size is normal. There is normal pulmonary artery systolic pressure.  3. Left atrial size was mildly dilated.  4. The mitral valve is abnormal. Mild mitral valve regurgitation.  No evidence  of mitral stenosis.  5. The aortic valve is tricuspid. There is mild calcification of the aortic valve. Aortic valve regurgitation is not visualized. Mild to moderate aortic valve sclerosis/calcification is present, without any evidence of aortic stenosis.  6. The inferior vena cava is normal in size with greater than 50% respiratory variability, suggesting right atrial pressure of 3 mmHg. FINDINGS  Left Ventricle: Left ventricular ejection fraction, by estimation, is 60 to 65%. The left ventricle has normal function. The left ventricle has no regional wall motion abnormalities. The left ventricular internal cavity size was normal in size. There is  no left ventricular hypertrophy. Left ventricular diastolic parameters were normal. Right Ventricle: The right ventricular size is normal. No increase in right ventricular wall thickness. Right ventricular systolic function is normal. There is normal pulmonary artery systolic pressure. The tricuspid regurgitant velocity is 2.42 m/s, and  with an assumed right atrial pressure of 3 mmHg, the estimated right ventricular systolic pressure is [id number] mmHg. Left Atrium: Left atrial size was mildly dilated. Right Atrium: Right atrial size was normal in size. Pericardium: There is no evidence of pericardial effusion. Mitral Valve: The mitral valve is abnormal. There is mild thickening of the mitral valve leaflet(s). There is mild calcification of the mitral valve leaflet(s). Mild mitral valve regurgitation. No evidence of mitral valve stenosis. Tricuspid Valve: The tricuspid valve is normal in structure. Tricuspid valve regurgitation is trivial. No evidence of tricuspid stenosis. Aortic Valve: The aortic valve is tricuspid. There is mild calcification of the aortic valve. Aortic valve regurgitation is not visualized. Mild to moderate aortic valve sclerosis/calcification is present, without any evidence of aortic stenosis. Pulmonic Valve: The pulmonic valve was normal in structure.  Pulmonic valve regurgitation is not visualized. No evidence of pulmonic stenosis. Aorta: The aortic root is normal in size and structure. Venous: The inferior vena cava is normal in size with greater than 50% respiratory variability, suggesting right atrial pressure of 3 mmHg. IAS/Shunts: No atrial level shunt detected by color flow Doppler.  LEFT VENTRICLE PLAX 2D LVIDd:         4.60 cm  Diastology LVIDs:         3.00 cm  LV e' medial:    7.07 cm/s LV PW:         1.00 cm  LV E/e' medial:  14.1 LV IVS:        1.00 cm  LV e' lateral:   8.59 cm/s LVOT diam:     1.70 cm  LV E/e' lateral: 11.6 LV SV:         43 LV SV Index:   25 LVOT Area:     2.27 cm  RIGHT VENTRICLE             IVC RV S prime:     10.90 cm/s  IVC diam: 1.70 cm TAPSE (M-mode): 1.8 cm LEFT ATRIUM             Index       RIGHT ATRIUM           Index LA diam:        4.30 cm 2.46 cm/m  RA Area:     12.60 cm LA Vol (A2C):   40.3 ml 23.10 ml/m RA Volume:   29.20 ml  16.74 ml/m LA Vol (A4C):   36.1 ml 20.69 ml/m LA Biplane Vol: 38.4 ml 22.01 ml/m  AORTIC VALVE LVOT Vmax:   82.90 cm/s LVOT Vmean:  55.100 cm/s LVOT  VTI:    0.190 m  AORTA Ao Root diam: 3.10 cm Ao Asc diam:  2.90 cm MITRAL VALVE               TRICUSPID VALVE MV Area (PHT): 2.87 cm    TR Peak grad:   23.4 mmHg MV Decel Time: 264 msec    TR Vmax:        242.00 cm/s MV E velocity: 99.40 cm/s MV A velocity: 61.30 cm/s  SHUNTS MV E/A ratio:  1.62        Systemic VTI:  0.19 m                            Systemic Diam: 1.70 cm Jenkins Rouge MD Electronically signed by Jenkins Rouge MD Signature Date/Time: 12/30/2020/4:39:08 PM    Final     Cardiac Studies   Left heart catheterization 03/13/20:  Colon Flattery RCA to Prox RCA lesion is 30% stenosed.  Previously placed Prox RCA to Dist RCA stent (unknown type) is widely patent.  Previously placed RPAV stent (unknown type) is widely patent.  RPDA lesion is 100% stenosed.  Ost Cx to Prox Cx lesion is 100% stenosed.  3rd Mrg lesion is 100%  stenosed.  Mid Cx to Dist Cx lesion is 100% stenosed.  Origin to Prox Graft lesion is 75% stenosed.  Mid LAD-1 lesion is 75% stenosed.  Mid LAD-2 lesion is 95% stenosed.  IMPRESSION:Ms. Litwiller has a patent vein to the PDA which was subtotally occluded at the time of her last cath 4 months ago. I suspect this may been related to spasm. The previously placed stents by Dr. Claiborne Billings in the proximal mid and distal RCA and PLA branches are completely patent. The vein to the circumflex obtuse marginal branch has a moderately high-grade segmental ostial/proximal stenosis although the small circumflex marginal branch distal to the graft insertion has occluded since the last cath. The graft only supplies a small posterior lateral distal branch with retrograde filling. This only supplies a small amount of myocardium. The LIMA is intact. I reviewed the films with Dr. Claiborne Billings we both agree that medical therapy is the best option. I have communicated this to Dr. Marlou Porch as well. A right common femoral angiogram was performed and a Mynx closure device was successfully deployed achieving hemostasis. The patient left the lab in stable condition.  Diagnostic Dominance: Right        LHC 11/23/19:   Ost LAD to Prox LAD lesion is 70% stenosed.  Prox LAD to Mid LAD lesion is 50% stenosed with 50% stenosed side branch in 1st Diag.  Mid Cx lesion is 99% stenosed.  2nd Mrg-2 lesion is 95% stenosed with 0% stenosed side branch in Lat 2nd Mrg.  2nd Mrg-1 lesion is 95% stenosed.  RPAV lesion is 95% stenosed.  Prox RCA to Mid RCA lesion is 80% stenosed.  Dist RCA lesion is 80% stenosed with 70% stenosed side branch in RPAV.  Origin to Prox Graft lesion is 80% stenosed.  Prox Graft to Mid Graft lesion is 70% stenosed.  A stent was successfully placed.  Post intervention, there is a 0% residual stenosis.  Post intervention, the side branch was reduced to 0% residual stenosis.  Post  intervention, there is a 0% residual stenosis.  Post intervention, there is a 0% residual stenosis.  A stent was successfully placed.  A stent was successfully placed.  Difficult but ultimately successful percutaneous coronary intervention to the  native dominant RCA in a patient with documented diffuse disease in the vein graft supplying her PDA vessel.   A total of 3 stents were inserted with a 2.0 x 30 mm Resolute Onyx DES stent most distal with tandem overlap with a 2.0 x 12 mm stent to cover the 80 and 95% distal stenoses before the PDA and PLA vessels and of a 2.5 x 30 mm Resolute stent to cover the 80% stenosis in the mid vessel followed by an additional area of narrowing of 70% just proximal to the acute margin. Prior to stenting, there was suggestion of distal RCA dissection following very low level inflation with a 2.0 x 12 balloon at the distal 95% stenosis which was successfully treated with stent insertion. At the completion of the procedure, all areas were reduced to 0%. There was brisk TIMI-3 flow. There was no evidence for dissection. Competitive filling was present down the PDA due to the SVG supplying the mid PDA region.  RECOMMENDATION: Long-term DAPT indefinitely. Hydration post procedure in this patient with baseline renal insufficiency. S of lipid-lowering therapy with target LDL less than 70. Concomitant medical therapy with nitrates, and a blocker, as well as ranolazine which was added yesterday for the patient's distal circumflex disease. Patient will follow up with Dr. Geraldo Pitter.  Diagnostic Dominance: Right    Intervention     TTE 12/30/20: 1. Left ventricular ejection fraction, by estimation, is 60 to 65%. The  left ventricle has normal function. The left ventricle has no regional  wall motion abnormalities. Left ventricular diastolic parameters were  normal.  2. Right ventricular systolic function is normal. The right ventricular  size is  normal. There is normal pulmonary artery systolic pressure.  3. Left atrial size was mildly dilated.  4. The mitral valve is abnormal. Mild mitral valve regurgitation. No  evidence of mitral stenosis.  5. The aortic valve is tricuspid. There is mild calcification of the  aortic valve. Aortic valve regurgitation is not visualized. Mild to  moderate aortic valve sclerosis/calcification is present, without any  evidence of aortic stenosis.  6. The inferior vena cava is normal in size with greater than 50%  respiratory variability, suggesting right atrial pressure of 3 mmHg.   LHC 12/31/20:  Overall, no significant change when compared to angiography performed in June 2021.  LIMA to LAD is patent.  Bypass graft to circumflex is patent.  Distribution of native vessel after the insertion is very small.  The native vessel diameter is minuscule.  Bypass graft to PDA is patent.  PDA is totally occluded at its ostium and is jailed by stent.  Left main is widely patent.  LAD is severely and diffusely diseased with 70 to 80% stenosis from proximal to mid.  Competitive flow is noted in the mid LAD due to LIMA graft insertion.  Circumflex is totally occluded in the mid vessel.  Very faint collaterals are noted to small "threadlike vessels".  Right coronary is widely patent and has a heavy burden of stents that extends from the mid vessel into the continuation beyond the origin of the PDA.  The PDA is jailed.  No significant obstruction is noted in the right coronary other than the PDA which is supplied by a graft.  Hyperdynamic left ventricle.  EF greater than 70%.  LVEDP is normal.  Recommendations:   Risk factor modification.  Uptitrate anti-ischemic therapy as needed to control angina.  Anatomy is stable over the past 10 months.  Diagnostic Dominance: Right  Patient Profile       60 y.o. female with a history ofCAD s/p CABG 08/2019 (LIMA to LAD, SVG to distal OM and SVG  to PDA with early graft failure of the SVG to PDA), PCI to native RCA 09/2019, HTN, HLD, CKD stage III and DM2who presented with chest pain and mildly elevated troponin for which Cardiology is following.  Assessment & Plan    1. NSTEMI in patient with CAD s/p CABG 08/2019 with subsequent PCI/DES to RCA 10/2019: patient presented 12/29/20 with chest pain temporarily relieved with SL nitro. HsTrop peaked at 74. EKG was non-ischemic. Echo showed EF 60-65%, normal LV diastolic function, no RWMA, mild MR, and mild LAE. She has an extensive CAD history including CABG x3 08/2019 with early graft failure of SVG-PDA resulting in PCI/DES to RCA 10/2019. She had recurrent chest pain and LHC 02/2020 showed new high-grade stenosis of OM branch distal to the graft, however was too small for intervention and recommended for medical management. She underwent repeat LHC 12/31/20 which showed stable disease from last year, again recommended for medical management. She has been chest pain free overnight and right groin cath site appears stable.  - Favor increasing imdur to '60mg'$  daily (has tolerated this dose in the past without issues)  - Continue aspirin and plavix - Continue statin - Continue ranexa and metoprolol   2. HTN: BP generally stable this admission - Continue metoprolol and imdur - Will defer addition of ACEi/ARB to MD  3. HLD: LDL 41 04/2020; at goal of <70 - Continue rosuvastatin  4. CKD stage 3: Cr stable at 1.46 today - Continue to monitor routinely   Outpatient follow-up has been arranged with Dr. Geraldo Pitter 01/27/21. AVS updated.        For questions or updates, please contact [place] Please consult [email or web address] for contact info under        Signed, Abigail Butts, PA-C  01/01/2021, 9:21 AM     Patient seen and examined and agree with Roby Lofts, PA-C as detailed above.  In brief, the patient is a 60 year old female with a history of CAD s/p CABG 08/2019 (LIMA to LAD, SVG to  distal OM and SVG to PDA with early graft failure of the SVG to PDA), PCI to native RCA 09/2019, HTN, HLD, CKD stage III and DM2 who presented with chest pain and mildly elevated troponin for which Cardiology was consulted.   Underwent coronary angiography on 01/01/21 with stable disease with no change from prior cath. Recommended for continued medical management.  Doing well this morning. Chest pain free. Asking to go home. Cr stable at 1.46.  GEN: No acute distress.   Neck: No JVD Cardiac: RRR, no murmurs, rubs, or gallops.  Respiratory: Clear to auscultation bilaterally. GI: Soft, nontender, non-distended  MS: No edema; No deformity. Right groin access site soft without evidence of hematoma Neuro:  Nonfocal  Psych: Normal affect   Plan: -Discharge home today -Will increase imdur to '60mg'$  daily -Start amlodipine 2.'5mg'$  daily for possible vasospasm -Continue ASA and plavix -Continue ranexa '1000mg'$  BID -Change metop to succinate '50mg'$  daily -Will arrange for cardiology follow-up  Gwyndolyn Kaufman, MD

## 2021-01-01 NOTE — Telephone Encounter (Signed)
Per Roby Lofts, set up [facility] visit on 01/27/21 at 10:00 am with Dr. Geraldo Pitter

## 2021-01-02 ENCOUNTER — Other Ambulatory Visit (HOSPITAL_BASED_OUTPATIENT_CLINIC_OR_DEPARTMENT_OTHER): Payer: Self-pay

## 2021-01-13 ENCOUNTER — Other Ambulatory Visit (HOSPITAL_BASED_OUTPATIENT_CLINIC_OR_DEPARTMENT_OTHER): Payer: Self-pay

## 2021-01-13 DIAGNOSIS — R601 Generalized edema: Secondary | ICD-10-CM

## 2021-01-13 MED ORDER — POTASSIUM CHLORIDE ER 10 MEQ PO TBCR
10.0000 meq | EXTENDED_RELEASE_TABLET | Freq: Every day | ORAL | 3 refills | Status: DC
  Filled 2021-01-13: qty 90, 90d supply, fill #0
  Filled 2021-04-16: qty 30, 30d supply, fill #1

## 2021-01-13 MED ORDER — FUROSEMIDE 20 MG PO TABS
20.0000 mg | ORAL_TABLET | Freq: Every day | ORAL | 3 refills | Status: DC
  Filled 2021-01-13: qty 30, 30d supply, fill #0
  Filled 2021-02-14: qty 30, 30d supply, fill #1
  Filled 2021-03-09: qty 30, 30d supply, fill #2

## 2021-01-14 ENCOUNTER — Other Ambulatory Visit: Payer: Self-pay | Admitting: Medical

## 2021-01-14 ENCOUNTER — Other Ambulatory Visit: Payer: Self-pay

## 2021-01-14 ENCOUNTER — Other Ambulatory Visit: Payer: Self-pay | Admitting: Family Medicine

## 2021-01-14 ENCOUNTER — Other Ambulatory Visit (HOSPITAL_BASED_OUTPATIENT_CLINIC_OR_DEPARTMENT_OTHER): Payer: Self-pay

## 2021-01-15 ENCOUNTER — Other Ambulatory Visit (HOSPITAL_BASED_OUTPATIENT_CLINIC_OR_DEPARTMENT_OTHER): Payer: Self-pay

## 2021-01-15 MED ORDER — RANOLAZINE ER 1000 MG PO TB12
1000.0000 mg | ORAL_TABLET | Freq: Two times a day (BID) | ORAL | 0 refills | Status: DC
  Filled 2021-01-15: qty 180, 90d supply, fill #0

## 2021-01-15 NOTE — Telephone Encounter (Signed)
This is Dr. Revankar's pt 

## 2021-01-16 ENCOUNTER — Other Ambulatory Visit (HOSPITAL_BASED_OUTPATIENT_CLINIC_OR_DEPARTMENT_OTHER): Payer: Self-pay

## 2021-01-17 ENCOUNTER — Other Ambulatory Visit (HOSPITAL_BASED_OUTPATIENT_CLINIC_OR_DEPARTMENT_OTHER): Payer: Self-pay

## 2021-01-21 DIAGNOSIS — I10 Essential (primary) hypertension: Secondary | ICD-10-CM | POA: Insufficient documentation

## 2021-01-21 DIAGNOSIS — F419 Anxiety disorder, unspecified: Secondary | ICD-10-CM | POA: Insufficient documentation

## 2021-01-21 DIAGNOSIS — E079 Disorder of thyroid, unspecified: Secondary | ICD-10-CM | POA: Insufficient documentation

## 2021-01-21 DIAGNOSIS — I2510 Atherosclerotic heart disease of native coronary artery without angina pectoris: Secondary | ICD-10-CM | POA: Insufficient documentation

## 2021-01-22 ENCOUNTER — Other Ambulatory Visit (HOSPITAL_BASED_OUTPATIENT_CLINIC_OR_DEPARTMENT_OTHER): Payer: Self-pay

## 2021-01-22 MED ORDER — ROPINIROLE HCL 1 MG PO TABS
ORAL_TABLET | ORAL | 2 refills | Status: DC
  Filled 2021-01-22: qty 249, 83d supply, fill #0
  Filled 2021-01-22: qty 21, 7d supply, fill #0

## 2021-01-23 ENCOUNTER — Other Ambulatory Visit (HOSPITAL_BASED_OUTPATIENT_CLINIC_OR_DEPARTMENT_OTHER): Payer: Self-pay

## 2021-01-23 LAB — CBC WITH DIFFERENTIAL/PLATELET
Basophils Absolute: 0 10*3/uL (ref 0.0–0.2)
Basos: 0 %
EOS (ABSOLUTE): 0.3 10*3/uL (ref 0.0–0.4)
Eos: 3 %
Hematocrit: 38.3 % (ref 34.0–46.6)
Hemoglobin: 12.5 g/dL (ref 11.1–15.9)
Immature Grans (Abs): 0.1 10*3/uL (ref 0.0–0.1)
Immature Granulocytes: 1 %
Lymphocytes Absolute: 1.3 10*3/uL (ref 0.7–3.1)
Lymphs: 15 %
MCH: 29.1 pg (ref 26.6–33.0)
MCHC: 32.6 g/dL (ref 31.5–35.7)
MCV: 89 fL (ref 79–97)
Monocytes Absolute: 0.6 10*3/uL (ref 0.1–0.9)
Monocytes: 6 %
Neutrophils Absolute: 6.8 10*3/uL (ref 1.4–7.0)
Neutrophils: 75 %
Platelets: 266 10*3/uL (ref 150–450)
RBC: 4.29 x10E6/uL (ref 3.77–5.28)
RDW: 14 % (ref 11.7–15.4)
WBC: 9.1 10*3/uL (ref 3.4–10.8)

## 2021-01-23 LAB — BASIC METABOLIC PANEL
BUN/Creatinine Ratio: 17 (ref 12–28)
BUN: 27 mg/dL (ref 8–27)
CO2: 19 mmol/L — ABNORMAL LOW (ref 20–29)
Calcium: 9.5 mg/dL (ref 8.7–10.3)
Chloride: 108 mmol/L — ABNORMAL HIGH (ref 96–106)
Creatinine, Ser: 1.56 mg/dL — ABNORMAL HIGH (ref 0.57–1.00)
Glucose: 149 mg/dL — ABNORMAL HIGH (ref 65–99)
Potassium: 4.6 mmol/L (ref 3.5–5.2)
Sodium: 142 mmol/L (ref 134–144)
eGFR: 38 mL/min/{1.73_m2} — ABNORMAL LOW (ref >59)

## 2021-01-23 LAB — LIPID PANEL
Chol/HDL Ratio: 2.4 ratio (ref 0.0–4.4)
Cholesterol, Total: 169 mg/dL (ref 100–199)
HDL: 71 mg/dL (ref >39)
LDL Chol Calc (NIH): 65 mg/dL (ref 0–99)
Triglycerides: 202 mg/dL — ABNORMAL HIGH (ref 0–149)
VLDL Cholesterol Cal: 33 mg/dL (ref 5–40)

## 2021-01-23 LAB — HEPATIC FUNCTION PANEL
ALT: 9 IU/L (ref 0–32)
AST: 10 IU/L (ref 0–40)
Albumin: 4.3 g/dL (ref 3.8–4.8)
Alkaline Phosphatase: 93 IU/L (ref 44–121)
Bilirubin Total: <0.2 mg/dL (ref 0.0–1.2)
Bilirubin, Direct: <0.1 mg/dL (ref 0.00–0.40)
Total Protein: 6.8 g/dL (ref 6.0–8.5)

## 2021-01-23 LAB — TSH: TSH: 1.02 u[IU]/mL (ref 0.450–4.500)

## 2021-01-27 ENCOUNTER — Ambulatory Visit: Payer: 59 | Admitting: Cardiology

## 2021-01-27 ENCOUNTER — Telehealth: Payer: Self-pay

## 2021-01-27 ENCOUNTER — Other Ambulatory Visit (HOSPITAL_BASED_OUTPATIENT_CLINIC_OR_DEPARTMENT_OTHER): Payer: Self-pay

## 2021-01-27 MED ORDER — CLOPIDOGREL BISULFATE 75 MG PO TABS
ORAL_TABLET | ORAL | 0 refills | Status: DC
  Filled 2021-01-27: qty 90, 90d supply, fill #0

## 2021-01-27 MED ORDER — LEVOTHYROXINE SODIUM 75 MCG PO TABS
ORAL_TABLET | ORAL | 2 refills | Status: DC
  Filled 2021-01-27: qty 90, 90d supply, fill #0

## 2021-01-27 MED ORDER — LEVOTHYROXINE SODIUM 50 MCG PO TABS
ORAL_TABLET | ORAL | 1 refills | Status: DC
  Filled 2021-01-27: qty 24, 90d supply, fill #0

## 2021-01-27 NOTE — Telephone Encounter (Signed)
-----   Message from Jenean Lindau, MD sent at 01/27/2021 11:31 AM EDT ----- I am not sure why she is not on statin therapy indicated that later start rosuvastatin 10 mg daily and liver lipid check in 6 weeks.  Diet low in carbohydrates.  If she does not want to stay with statin therapy then you can use fish oil 2 g twice daily.  Liver lipid check in 3 months. Jenean Lindau, MD 01/27/2021 11:29 AM

## 2021-01-27 NOTE — Telephone Encounter (Signed)
Spoke to the patient just now and let her know Dr. Julien Nordmann recommendations. She states that she will start taking this OTC fish oil and will get her labs rechecked in 3 months.

## 2021-01-31 ENCOUNTER — Ambulatory Visit: Payer: 59 | Admitting: Cardiology

## 2021-02-05 ENCOUNTER — Other Ambulatory Visit (HOSPITAL_BASED_OUTPATIENT_CLINIC_OR_DEPARTMENT_OTHER): Payer: Self-pay

## 2021-02-05 ENCOUNTER — Other Ambulatory Visit: Payer: Self-pay | Admitting: Cardiology

## 2021-02-05 ENCOUNTER — Other Ambulatory Visit: Payer: Self-pay

## 2021-02-05 MED ORDER — NITROGLYCERIN 0.4 MG SL SUBL
0.4000 mg | SUBLINGUAL_TABLET | SUBLINGUAL | 2 refills | Status: DC | PRN
  Filled 2021-02-05: qty 25, 7d supply, fill #0
  Filled 2021-09-25: qty 25, 7d supply, fill #1
  Filled 2021-11-19: qty 25, 7d supply, fill #2

## 2021-02-05 MED ORDER — BUPROPION HCL ER (XL) 300 MG PO TB24
1.0000 | ORAL_TABLET | Freq: Every day | ORAL | 3 refills | Status: DC
  Filled 2021-02-05: qty 90, 90d supply, fill #0

## 2021-02-07 ENCOUNTER — Other Ambulatory Visit (HOSPITAL_BASED_OUTPATIENT_CLINIC_OR_DEPARTMENT_OTHER): Payer: Self-pay

## 2021-02-14 ENCOUNTER — Other Ambulatory Visit: Payer: Self-pay

## 2021-02-14 ENCOUNTER — Other Ambulatory Visit (HOSPITAL_BASED_OUTPATIENT_CLINIC_OR_DEPARTMENT_OTHER): Payer: Self-pay

## 2021-02-17 ENCOUNTER — Other Ambulatory Visit (HOSPITAL_BASED_OUTPATIENT_CLINIC_OR_DEPARTMENT_OTHER): Payer: Self-pay

## 2021-02-17 MED ORDER — TRAZODONE HCL 150 MG PO TABS
ORAL_TABLET | Freq: Every day | ORAL | 1 refills | Status: DC
  Filled 2021-02-17: qty 90, 90d supply, fill #0

## 2021-02-20 ENCOUNTER — Other Ambulatory Visit (HOSPITAL_BASED_OUTPATIENT_CLINIC_OR_DEPARTMENT_OTHER): Payer: Self-pay

## 2021-02-20 MED ORDER — LACTULOSE ENCEPHALOPATHY 10 GM/15ML PO SOLN
ORAL | 3 refills | Status: DC
  Filled 2021-02-20: qty 360, 30d supply, fill #0
  Filled 2021-03-06: qty 360, 28d supply, fill #0

## 2021-02-20 MED ORDER — ZOLPIDEM TARTRATE 5 MG PO TABS
ORAL_TABLET | ORAL | 2 refills | Status: DC
  Filled 2021-02-20: qty 30, 30d supply, fill #0

## 2021-03-03 DIAGNOSIS — G2581 Restless legs syndrome: Secondary | ICD-10-CM | POA: Insufficient documentation

## 2021-03-05 ENCOUNTER — Other Ambulatory Visit (HOSPITAL_BASED_OUTPATIENT_CLINIC_OR_DEPARTMENT_OTHER): Payer: Self-pay

## 2021-03-05 MED ORDER — VALACYCLOVIR HCL 1 G PO TABS
ORAL_TABLET | ORAL | 0 refills | Status: DC
  Filled 2021-03-05: qty 14, 7d supply, fill #0

## 2021-03-06 ENCOUNTER — Encounter: Payer: Self-pay | Admitting: Cardiology

## 2021-03-06 ENCOUNTER — Ambulatory Visit (INDEPENDENT_AMBULATORY_CARE_PROVIDER_SITE_OTHER): Payer: 59 | Admitting: Cardiology

## 2021-03-06 ENCOUNTER — Other Ambulatory Visit (HOSPITAL_BASED_OUTPATIENT_CLINIC_OR_DEPARTMENT_OTHER): Payer: Self-pay

## 2021-03-06 ENCOUNTER — Other Ambulatory Visit: Payer: Self-pay

## 2021-03-06 VITALS — BP 118/58 | HR 86 | Ht 63.0 in | Wt 170.8 lb

## 2021-03-06 DIAGNOSIS — I10 Essential (primary) hypertension: Secondary | ICD-10-CM | POA: Diagnosis not present

## 2021-03-06 DIAGNOSIS — N183 Chronic kidney disease, stage 3 unspecified: Secondary | ICD-10-CM | POA: Diagnosis not present

## 2021-03-06 DIAGNOSIS — E785 Hyperlipidemia, unspecified: Secondary | ICD-10-CM

## 2021-03-06 DIAGNOSIS — I25119 Atherosclerotic heart disease of native coronary artery with unspecified angina pectoris: Secondary | ICD-10-CM | POA: Diagnosis not present

## 2021-03-06 DIAGNOSIS — E039 Hypothyroidism, unspecified: Secondary | ICD-10-CM

## 2021-03-06 DIAGNOSIS — E088 Diabetes mellitus due to underlying condition with unspecified complications: Secondary | ICD-10-CM

## 2021-03-06 NOTE — Patient Instructions (Signed)
Medication Instructions:  No medication changes. *If you need a refill on your cardiac medications before your next appointment, please call your pharmacy*   Lab Work: Your physician recommends that you have a BMET today in the office.   Your physician recommends that you return for lab work in: before your next visit. You need to have labs done when you are fasting.  You can come Monday through Friday 8:30 am to 12:00 pm and 1:15 to 4:30. You do not need to make an appointment as the order has already been placed. The labs you are going to have done are BMET, CBC, TSH, LFT and Lipids.  If you have labs (blood work) drawn today and your tests are completely normal, you will receive your results only by: [place] (if you have MyChart) OR A paper copy in the mail If you have any lab test that is abnormal or we need to change your treatment, we will call you to review the results.   Testing/Procedures: None ordered   Follow-Up: At [facility], you and your health needs are our priority.  As part of our continuing mission to provide you with exceptional heart care, we have created designated Provider Care Teams.  These Care Teams include your primary Cardiologist (physician) and Advanced Practice Providers (APPs -  Physician Assistants and Nurse Practitioners) who all work together to provide you with the care you need, when you need it.  We recommend signing up for the patient portal called "MyChart".  Sign up information is provided on this After Visit Summary.  MyChart is used to connect with patients for Virtual Visits (Telemedicine).  Patients are able to view lab/test results, encounter notes, upcoming appointments, etc.  Non-urgent messages can be sent to your provider as well.   To learn more about what you can do with MyChart, go to [email or web address].    Your next appointment:   1 month(s)  The format for your next appointment:   In Person  Provider:   Jyl Heinz, MD   Other Instructions NA

## 2021-03-06 NOTE — Progress Notes (Signed)
Cardiology Office Note:    Date:  03/06/2021   ID:  Meghan Terry, DOB March 23, 1961, MRN [id number]  PCP:  Garwin Brothers, MD  Cardiologist:  Jenean Lindau, MD   Referring MD: Garwin Brothers, MD    ASSESSMENT:    1. Coronary artery disease involving native coronary artery of native heart with angina pectoris ([place])   2. Essential hypertension   3. Hyperlipidemia with target LDL less than 70   4. Stage 3 chronic kidney disease, unspecified whether stage 3a or 3b CKD ([place])   5. Diabetes mellitus due to underlying condition with unspecified complications ([place])    PLAN:    In order of problems listed above:  Coronary artery disease: Secondary prevention stressed with patient.  Importance of compliance with diet medication stressed and she vocalized understanding.  She was advised to walk at least half an hour a day 5 days a week and she promises to do so. Bilateral pedal edema: Ejection fraction was fine on recent evaluation.  Coronary angiography was unremarkable.  I have switched her furosemide to torsemide on a daily basis.  She will have a Chem-7 done today.  Salt intake issues and diet was emphasized.  She will be seen in follow-up appointment in a month or earlier if she has any concerns. Mixed dyslipidemia: Diet was emphasized.  She promises to do better. Obesity: Weight gain was reemphasized and Discussed my concern and she understands. Patient will be seen in follow-up appointment in 4 weeks or earlier if the patient has any concerns    Medication Adjustments/Labs and Tests Ordered: Current medicines are reviewed at length with the patient today.  Concerns regarding medicines are outlined above.  No orders of the defined types were placed in this encounter.  No orders of the defined types were placed in this encounter.    No chief complaint on file.    History of Present Illness:    Meghan Terry is a 60 y.o. female.  Patient has past medical history of coronary artery disease,  essential hypertension dyslipidemia and diabetes mellitus.  She denies any problems at this time and takes care of activities of daily living.  No chest pain orthopnea or PND.  At the time of my evaluation, the patient is alert awake oriented and in no distress.  She walks on a regular basis.  She mentions to me that she is having bilateral pedal edema.  She underwent coronary angiography recently and her coronary disease was largely unchanged and she is happy about it.  Past Medical History:  Diagnosis Date   Angina pectoris ([place]) 11/17/2019   Anxiety    Atypical chest pain 12/30/2020   CKD (chronic kidney disease) stage 3b, GFR 30-59 ml/min 12/03/2019   Coronary artery disease    Coronary artery disease involving native coronary artery of native heart with angina pectoris ([place]) 10/03/2019   Depression 04/25/2017   Diabetes mellitus due to underlying condition with unspecified complications ([place]) 99991111   Essential hypertension 09/05/2019   Ex-smoker 09/05/2019   Family history of coronary artery disease 09/05/2019   History of depression 04/15/2020   Hx of diabetes mellitus 04/15/2020   Hyperlipidemia with target LDL less than 70 11/24/2019   Hypertension    Hypothyroid    Migraine 04/25/2017   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   Thyroid disease     Past Surgical History:  Procedure Laterality Date   ABDOMINAL HYSTERECTOMY     APPENDECTOMY     CARDIAC CATHETERIZATION  11/22/2019   CHOLECYSTECTOMY     COLONOSCOPY     CORONARY ARTERY BYPASS GRAFT N/A 09/15/2019   Procedure: CORONARY ARTERY BYPASS GRAFTING (CABG) times three on pump using left internal mammary artery and right and left greater saphenous veins  harvested endoscopically;  Surgeon: Gaye Pollack, MD;  Location: [facility];  Service: Open Heart Surgery;  Laterality: N/A;   CORONARY STENT INTERVENTION N/A 11/23/2019   Procedure: CORONARY STENT INTERVENTION;  Surgeon: [name], MD;  Location: [facility] [facility];  Service: Cardiovascular;  Laterality: N/A;   KNEE ARTHROSCOPY     LEFT HEART CATH AND CORONARY ANGIOGRAPHY N/A 09/14/2019   Procedure: LEFT HEART CATH AND CORONARY ANGIOGRAPHY;  Surgeon: [name], MD;  Location: [place] [facility];  Service: Cardiovascular;  Laterality: N/A;   LEFT HEART CATH AND CORS/GRAFTS ANGIOGRAPHY N/A 11/22/2019   Procedure: LEFT HEART CATH AND CORS/GRAFTS ANGIOGRAPHY;  Surgeon: [name], MD;  Location: [facility] [facility];  Service: Cardiovascular;  Laterality: N/A;   LEFT HEART CATH AND CORS/GRAFTS ANGIOGRAPHY N/A 03/13/2020   Procedure: LEFT HEART CATH AND CORS/GRAFTS ANGIOGRAPHY;  Surgeon: Lorretta Harp, MD;  Location: [place] [facility];  Service: Cardiovascular;  Laterality: N/A;   LEFT HEART CATH AND CORS/GRAFTS ANGIOGRAPHY N/A 12/31/2020   Procedure: LEFT HEART CATH AND CORS/GRAFTS ANGIOGRAPHY;  Surgeon: Belva Crome, MD;  Location: [facility];  Service: Cardiovascular;  Laterality: N/A;   OSTEOCHONDRAL DEFECT REPAIR/RECONSTRUCTION Left 11/29/2014   Procedure: LEFT ANKLE MEDIAL MALLEOLUS OSTEOTOMY,AUTO GRAFT FROM CALCANEOUS;  OS TIBIA DENORO GRAFTING TALUS;  Surgeon: Wylene Simmer, MD;  Location: [facility];  Service: Orthopedics;  Laterality: Left;   TEE WITHOUT CARDIOVERSION N/A 09/15/2019   Procedure: TRANSESOPHAGEAL ECHOCARDIOGRAM (TEE);  Surgeon: Gaye Pollack, MD;  Location: [facility];  Service: Open Heart Surgery;  Laterality: N/A;   TUBAL LIGATION      Current Medications: Current Meds  Medication Sig   acetaminophen (TYLENOL) 500 MG tablet Take 500-1,000 mg by mouth every 6 (six) hours as needed for pain (for pain.).   amLODipine (NORVASC) 2.5 MG  tablet Take 1 tablet (2.5 mg total) by mouth daily.   aspirin EC 81 MG tablet Take 1 tablet (81 mg total) by mouth daily.   buPROPion (WELLBUTRIN XL) 300 MG 24 hr tablet Take 300 mg by mouth daily.   clopidogrel (PLAVIX) 75 MG tablet Take 75 mg by mouth daily.   furosemide (LASIX) 20 MG tablet Take 1 tablet (20 mg total) by mouth daily.   isosorbide mononitrate (IMDUR) 60 MG 24 hr tablet Take 1 tablet (60 mg total) by mouth daily.   lactulose, encephalopathy, (CHRONULAC) 10 GM/15ML SOLN Take 30 ML by mouth three times a week as needed for constipation   levothyroxine (SYNTHROID) 50 MCG tablet Take 50 mcg by mouth daily before breakfast. On Saturday and Sunday   levothyroxine (SYNTHROID) 75 MCG tablet Take 75 mcg by mouth daily at 12 noon. Monday through Friday   metoprolol succinate (TOPROL-XL) 50 MG 24 hr tablet Take 1 tablet (50 mg total) by mouth at bedtime. Take with or immediately following a meal.   nitroGLYCERIN (NITROSTAT) 0.4  MG SL tablet Place 1 tablet (0.4 mg total) under the tongue every 5 (five) minutes as needed for chest pain.   ondansetron (ZOFRAN) 4 MG tablet Take 4 mg by mouth 2 (two) times daily as needed for nausea/vomiting.   potassium chloride (KLOR-CON) 10 MEQ tablet Take 1 tablet (10 mEq total) by mouth daily.   ranolazine (RANEXA) 1000 MG SR tablet Take 1 tablet (1,000 mg total) by mouth 2 (two) times daily.   rOPINIRole (REQUIP) 1 MG tablet Take 1 mg by mouth 3 (three) times daily.   rOPINIRole (REQUIP) 4 MG tablet Take 4 mg by mouth at bedtime.   rosuvastatin (CRESTOR) 40 MG tablet Take 40 mg by mouth daily.   topiramate (TOPAMAX) 100 MG tablet Take 100 mg by mouth 2 (two) times daily.   traZODone (DESYREL) 150 MG tablet Take 150 mg by mouth at bedtime.   valACYclovir (VALTREX) 1000 MG tablet take 1 tablet (1,000 mg) by mouth twice day for 7 days.     Allergies:   Carbidopa-levodopa, Prednisone, and Venlafaxine   Social History   Socioeconomic History   Marital  status: Married    Spouse name: Not on file   Number of children: Not on file   Years of education: Not on file   Highest education level: Not on file  Occupational History   Not on file  Tobacco Use   Smoking status: Former    Pack years: 0.00   Smokeless tobacco: Never  Vaping Use   Vaping Use: Every day   Substances: Nicotine  Substance and Sexual Activity   Alcohol use: No   Drug use: No   Sexual activity: Not on file    Comment: e-cig  Other Topics Concern   Not on file  Social History Narrative   Not on file   Social Determinants of Health   Financial Resource Strain: Not on file  Food Insecurity: Not on file  Transportation Needs: Not on file  Physical Activity: Not on file  Stress: Not on file  Social Connections: Not on file     Family History: The patient's family history includes Asthma in her mother; COPD in her mother; Congestive Heart Failure in her father; Diabetes in her mother; Macular degeneration in her mother.  ROS:   Please see the history of present illness.    All other systems reviewed and are negative.  EKGs/Labs/Other Studies Reviewed:    The following studies were reviewed today: LEFT HEART CATH AND CORS/GRAFTS ANGIOGRAPHY    Conclusion  Overall, no significant change when compared to angiography performed in June 2021. LIMA to LAD is patent. Bypass graft to circumflex is patent.  Distribution of native vessel after the insertion is very small.  The native vessel diameter is minuscule. Bypass graft to PDA is patent.  PDA is totally occluded at its ostium and is jailed by stent. Left main is widely patent. LAD is severely and diffusely diseased with 70 to 80% stenosis from proximal to mid.  Competitive flow is noted in the mid LAD due to LIMA graft insertion. Circumflex is totally occluded in the mid vessel.  Very faint collaterals are noted to small "threadlike vessels". Right coronary is widely patent and has a heavy burden of stents  that extends from the mid vessel into the continuation beyond the origin of the PDA.  The PDA is jailed.  No significant obstruction is noted in the right coronary other than the PDA which is supplied by a graft. Hyperdynamic  left ventricle.  EF greater than 70%.  LVEDP is normal.   Recommendations:   Risk factor modification. Uptitrate anti-ischemic therapy as needed to control angina. Anatomy is stable over the past 10 months.    Recent Labs: 01/01/2021: Magnesium 1.9 01/22/2021: ALT 9; BUN 27; Creatinine, Ser 1.56; Hemoglobin 12.5; Platelets 266; Potassium 4.6; Sodium 142; TSH 1.020  Recent Lipid Panel    Component Value Date/Time   CHOL 169 01/22/2021 0931   TRIG 202 (H) 01/22/2021 0931   HDL 71 01/22/2021 0931   CHOLHDL 2.4 01/22/2021 0931   CHOLHDL 3.3 09/18/2019 0355   VLDL 24 09/18/2019 0355   LDLCALC 65 01/22/2021 0931    Physical Exam:    VS:  BP (!) 118/58   Pulse 86   Ht '5\' 3"'$  (1.6 m)   Wt 170 lb 12.8 oz (77.5 kg)   SpO2 97%   BMI 30.26 kg/m     Wt Readings from Last 3 Encounters:  03/06/21 170 lb 12.8 oz (77.5 kg)  01/01/21 162 lb 6.4 oz (73.7 kg)  05/07/20 147 lb 6.4 oz (66.9 kg)     GEN: Patient is in no acute distress HEENT: Normal NECK: No JVD; No carotid bruits LYMPHATICS: No lymphadenopathy CARDIAC: Hear sounds regular, 2/6 systolic murmur at the apex. RESPIRATORY:  Clear to auscultation without rales, wheezing or rhonchi  ABDOMEN: Soft, non-tender, non-distended MUSCULOSKELETAL:  2 plus edema; No deformity  SKIN: Warm and dry NEUROLOGIC:  Alert and oriented x 3 PSYCHIATRIC:  Normal affect   Signed, Jenean Lindau, MD  03/06/2021 2:07 PM    [place]

## 2021-03-07 LAB — BASIC METABOLIC PANEL
BUN/Creatinine Ratio: 11 — ABNORMAL LOW (ref 12–28)
BUN: 22 mg/dL (ref 8–27)
CO2: 21 mmol/L (ref 20–29)
Calcium: 8.9 mg/dL (ref 8.7–10.3)
Chloride: 104 mmol/L (ref 96–106)
Creatinine, Ser: 1.95 mg/dL — ABNORMAL HIGH (ref 0.57–1.00)
Glucose: 174 mg/dL — ABNORMAL HIGH (ref 65–99)
Potassium: 3.8 mmol/L (ref 3.5–5.2)
Sodium: 138 mmol/L (ref 134–144)
eGFR: 29 mL/min/{1.73_m2} — ABNORMAL LOW (ref >59)

## 2021-03-09 ENCOUNTER — Other Ambulatory Visit: Payer: Self-pay | Admitting: Family Medicine

## 2021-03-10 ENCOUNTER — Other Ambulatory Visit (HOSPITAL_BASED_OUTPATIENT_CLINIC_OR_DEPARTMENT_OTHER): Payer: Self-pay

## 2021-03-11 ENCOUNTER — Other Ambulatory Visit: Payer: Self-pay

## 2021-03-11 ENCOUNTER — Other Ambulatory Visit (HOSPITAL_BASED_OUTPATIENT_CLINIC_OR_DEPARTMENT_OTHER): Payer: Self-pay

## 2021-03-11 MED ORDER — ISOSORBIDE MONONITRATE ER 60 MG PO TB24
60.0000 mg | ORAL_TABLET | Freq: Every day | ORAL | 1 refills | Status: DC
  Filled 2021-03-11: qty 30, 30d supply, fill #0
  Filled 2021-03-31 – 2021-04-03 (×3): qty 30, 30d supply, fill #1

## 2021-03-11 MED ORDER — METOPROLOL SUCCINATE ER 50 MG PO TB24
50.0000 mg | ORAL_TABLET | Freq: Every day | ORAL | 1 refills | Status: DC
  Filled 2021-03-11: qty 30, 30d supply, fill #0
  Filled 2021-03-31 – 2021-04-02 (×2): qty 30, 30d supply, fill #1

## 2021-03-11 MED ORDER — AMLODIPINE BESYLATE 2.5 MG PO TABS
2.5000 mg | ORAL_TABLET | Freq: Every day | ORAL | 1 refills | Status: DC
  Filled 2021-03-11: qty 30, 30d supply, fill #0
  Filled 2021-04-16: qty 30, 30d supply, fill #1

## 2021-03-12 ENCOUNTER — Other Ambulatory Visit (HOSPITAL_BASED_OUTPATIENT_CLINIC_OR_DEPARTMENT_OTHER): Payer: Self-pay

## 2021-03-12 MED ORDER — TOPIRAMATE 100 MG PO TABS
100.0000 mg | ORAL_TABLET | Freq: Two times a day (BID) | ORAL | 3 refills | Status: DC
  Filled 2021-03-12: qty 180, 90d supply, fill #0

## 2021-03-13 ENCOUNTER — Other Ambulatory Visit (HOSPITAL_BASED_OUTPATIENT_CLINIC_OR_DEPARTMENT_OTHER): Payer: Self-pay

## 2021-03-13 ENCOUNTER — Other Ambulatory Visit: Payer: Self-pay

## 2021-03-13 MED ORDER — ROPINIROLE HCL 4 MG PO TABS
ORAL_TABLET | ORAL | 3 refills | Status: DC
  Filled 2021-03-13: qty 90, 90d supply, fill #0

## 2021-03-14 ENCOUNTER — Other Ambulatory Visit (HOSPITAL_BASED_OUTPATIENT_CLINIC_OR_DEPARTMENT_OTHER): Payer: Self-pay

## 2021-03-28 ENCOUNTER — Other Ambulatory Visit: Payer: Self-pay

## 2021-03-29 LAB — CBC WITH DIFFERENTIAL/PLATELET
Basophils Absolute: 0 10*3/uL (ref 0.0–0.2)
Basos: 0 %
EOS (ABSOLUTE): 0.2 10*3/uL (ref 0.0–0.4)
Eos: 2 %
Hematocrit: 35.4 % (ref 34.0–46.6)
Hemoglobin: 11.6 g/dL (ref 11.1–15.9)
Immature Grans (Abs): 0.1 10*3/uL (ref 0.0–0.1)
Immature Granulocytes: 1 %
Lymphocytes Absolute: 1.2 10*3/uL (ref 0.7–3.1)
Lymphs: 12 %
MCH: 28.4 pg (ref 26.6–33.0)
MCHC: 32.8 g/dL (ref 31.5–35.7)
MCV: 87 fL (ref 79–97)
Monocytes Absolute: 0.5 10*3/uL (ref 0.1–0.9)
Monocytes: 5 %
Neutrophils Absolute: 8.1 10*3/uL — ABNORMAL HIGH (ref 1.4–7.0)
Neutrophils: 80 %
Platelets: 254 10*3/uL (ref 150–450)
RBC: 4.09 x10E6/uL (ref 3.77–5.28)
RDW: 14.5 % (ref 11.7–15.4)
WBC: 10.1 10*3/uL (ref 3.4–10.8)

## 2021-03-29 LAB — LIPID PANEL
Chol/HDL Ratio: 2.1 ratio (ref 0.0–4.4)
Cholesterol, Total: 147 mg/dL (ref 100–199)
HDL: 71 mg/dL (ref >39)
LDL Chol Calc (NIH): 51 mg/dL (ref 0–99)
Triglycerides: 147 mg/dL (ref 0–149)
VLDL Cholesterol Cal: 25 mg/dL (ref 5–40)

## 2021-03-29 LAB — BASIC METABOLIC PANEL
BUN/Creatinine Ratio: 10 — ABNORMAL LOW (ref 12–28)
BUN: 19 mg/dL (ref 8–27)
CO2: 18 mmol/L — ABNORMAL LOW (ref 20–29)
Calcium: 9.1 mg/dL (ref 8.7–10.3)
Chloride: 105 mmol/L (ref 96–106)
Creatinine, Ser: 1.88 mg/dL — ABNORMAL HIGH (ref 0.57–1.00)
Glucose: 157 mg/dL — ABNORMAL HIGH (ref 65–99)
Potassium: 4.3 mmol/L (ref 3.5–5.2)
Sodium: 138 mmol/L (ref 134–144)
eGFR: 30 mL/min/{1.73_m2} — ABNORMAL LOW (ref >59)

## 2021-03-29 LAB — HEPATIC FUNCTION PANEL
ALT: 10 IU/L (ref 0–32)
AST: 12 IU/L (ref 0–40)
Albumin: 4.3 g/dL (ref 3.8–4.8)
Alkaline Phosphatase: 82 IU/L (ref 44–121)
Bilirubin Total: 0.2 mg/dL (ref 0.0–1.2)
Bilirubin, Direct: 0.11 mg/dL (ref 0.00–0.40)
Total Protein: 6.7 g/dL (ref 6.0–8.5)

## 2021-03-29 LAB — TSH: TSH: 1.65 u[IU]/mL (ref 0.450–4.500)

## 2021-04-01 ENCOUNTER — Ambulatory Visit (INDEPENDENT_AMBULATORY_CARE_PROVIDER_SITE_OTHER): Payer: 59 | Admitting: Cardiology

## 2021-04-01 ENCOUNTER — Other Ambulatory Visit: Payer: Self-pay

## 2021-04-01 ENCOUNTER — Encounter: Payer: Self-pay | Admitting: Cardiology

## 2021-04-01 ENCOUNTER — Other Ambulatory Visit (HOSPITAL_BASED_OUTPATIENT_CLINIC_OR_DEPARTMENT_OTHER): Payer: Self-pay

## 2021-04-01 VITALS — BP 130/68 | HR 83 | Ht 63.0 in | Wt 172.2 lb

## 2021-04-01 DIAGNOSIS — I25119 Atherosclerotic heart disease of native coronary artery with unspecified angina pectoris: Secondary | ICD-10-CM | POA: Diagnosis not present

## 2021-04-01 DIAGNOSIS — R0602 Shortness of breath: Secondary | ICD-10-CM

## 2021-04-01 DIAGNOSIS — I10 Essential (primary) hypertension: Secondary | ICD-10-CM

## 2021-04-01 DIAGNOSIS — R600 Localized edema: Secondary | ICD-10-CM

## 2021-04-01 DIAGNOSIS — E785 Hyperlipidemia, unspecified: Secondary | ICD-10-CM | POA: Diagnosis not present

## 2021-04-01 DIAGNOSIS — Z951 Presence of aortocoronary bypass graft: Secondary | ICD-10-CM

## 2021-04-01 DIAGNOSIS — E088 Diabetes mellitus due to underlying condition with unspecified complications: Secondary | ICD-10-CM

## 2021-04-01 NOTE — Patient Instructions (Signed)
Medication Instructions:  No medication changes. *If you need a refill on your cardiac medications before your next appointment, please call your pharmacy*   Lab Work: None ordered If you have labs (blood work) drawn today and your tests are completely normal, you will receive your results only by: [place] (if you have MyChart) OR A paper copy in the mail If you have any lab test that is abnormal or we need to change your treatment, we will call you to review the results.   Testing/Procedures: None ordered   Follow-Up: At [facility], you and your health needs are our priority.  As part of our continuing mission to provide you with exceptional heart care, we have created designated Provider Care Teams.  These Care Teams include your primary Cardiologist (physician) and Advanced Practice Providers (APPs -  Physician Assistants and Nurse Practitioners) who all work together to provide you with the care you need, when you need it.  We recommend signing up for the patient portal called "MyChart".  Sign up information is provided on this After Visit Summary.  MyChart is used to connect with patients for Virtual Visits (Telemedicine).  Patients are able to view lab/test results, encounter notes, upcoming appointments, etc.  Non-urgent messages can be sent to your provider as well.   To learn more about what you can do with MyChart, go to [email or web address].    Your next appointment:   As directed after stat dvt study  The format for your next appointment:   In Person  Provider:   Jyl Heinz, MD   Other Instructions NA

## 2021-04-01 NOTE — Progress Notes (Signed)
Cardiology Office Note:    Date:  04/01/2021   ID:  Meghan Terry, DOB 1960/12/09, MRN [id number]  PCP:  Garwin Brothers, MD  Cardiologist:  Jenean Lindau, MD   Referring MD: Garwin Brothers, MD    ASSESSMENT:    1. Coronary artery disease involving native coronary artery of native heart with angina pectoris ([place])   2. Essential hypertension   3. Hyperlipidemia with target LDL less than 70   4. S/P CABG x 3   5. Diabetes mellitus due to underlying condition with unspecified complications ([place])    PLAN:    In order of problems listed above:  Coronary artery disease: Secondary prevention stressed with the patient.  Importance of compliance with diet medication stressed and she vocalized understanding. Essential hypertension: Blood pressure stable and diet was emphasized. Pedal edema: Left side is greater than right.  I will do a stat DVT study to make sure there is no venous thrombosis.  She also gives history of shortness of breath which is steady  I will get a Chem-7 and D-dimer at [facility] hospital and plan further evaluation on the results of these tests. Mixed dyslipidemia and diabetes mellitus: Diet was emphasized and she vocalized understanding. Patient will be seen in follow-up appointment in 3 to 4 weeks or earlier if the patient has any concerns    Medication Adjustments/Labs and Tests Ordered: Current medicines are reviewed at length with the patient today.  Concerns regarding medicines are outlined above.  No orders of the defined types were placed in this encounter.  No orders of the defined types were placed in this encounter.    No chief complaint on file.    History of Present Illness:    Meghan Terry is a 60 y.o. female.  Patient has past medical history of coronary artery disease, anemia insufficiency, essential hypertension and dyslipidemia.  She mentions to me that her pedal edema has not gotten better.  She says that her left leg is bigger than the right now.  No  orthopnea or PND.  No pain in the leg.  At the time of my evaluation, the patient is alert awake oriented and in no distress.  Past Medical History:  Diagnosis Date   Angina pectoris ([place]) 11/17/2019   Anxiety    Atypical chest pain 12/30/2020   CKD (chronic kidney disease) stage 3b, GFR 30-59 ml/min 12/03/2019   Coronary artery disease    Coronary artery disease involving native coronary artery of native heart with angina pectoris ([place]) 10/03/2019   Depression 04/25/2017   Diabetes mellitus due to underlying condition with unspecified complications ([place]) 99991111   Essential hypertension 09/05/2019   Ex-smoker 09/05/2019   Family history of coronary artery disease 09/05/2019   History of depression 04/15/2020   Hx of diabetes mellitus 04/15/2020   Hyperlipidemia with target LDL less than 70 11/24/2019   Hypertension    Hypothyroid    Migraine 04/25/2017   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   Thyroid disease     Past Surgical History:  Procedure Laterality Date   ABDOMINAL HYSTERECTOMY     APPENDECTOMY     CARDIAC CATHETERIZATION  11/22/2019   CHOLECYSTECTOMY  COLONOSCOPY     CORONARY ARTERY BYPASS GRAFT N/A 09/15/2019   Procedure: CORONARY ARTERY BYPASS GRAFTING (CABG) times three on pump using left internal mammary artery and right and left greater saphenous veins harvested endoscopically;  Surgeon: Gaye Pollack, MD;  Location: [facility];  Service: Open Heart Surgery;  Laterality: N/A;   CORONARY STENT INTERVENTION N/A 11/23/2019   Procedure: CORONARY STENT INTERVENTION;  Surgeon: [name], MD;  Location: [place] [facility];  Service: Cardiovascular;  Laterality: N/A;   KNEE ARTHROSCOPY     LEFT HEART CATH  AND CORONARY ANGIOGRAPHY N/A 09/14/2019   Procedure: LEFT HEART CATH AND CORONARY ANGIOGRAPHY;  Surgeon: [name], MD;  Location: [place] [facility];  Service: Cardiovascular;  Laterality: N/A;   LEFT HEART CATH AND CORS/GRAFTS ANGIOGRAPHY N/A 11/22/2019   Procedure: LEFT HEART CATH AND CORS/GRAFTS ANGIOGRAPHY;  Surgeon: [name], MD;  Location: [place] [facility];  Service: Cardiovascular;  Laterality: N/A;   LEFT HEART CATH AND CORS/GRAFTS ANGIOGRAPHY N/A 03/13/2020   Procedure: LEFT HEART CATH AND CORS/GRAFTS ANGIOGRAPHY;  Surgeon: Lorretta Harp, MD;  Location: [place] [facility];  Service: Cardiovascular;  Laterality: N/A;   LEFT HEART CATH AND CORS/GRAFTS ANGIOGRAPHY N/A 12/31/2020   Procedure: LEFT HEART CATH AND CORS/GRAFTS ANGIOGRAPHY;  Surgeon: Belva Crome, MD;  Location: [place] [facility];  Service: Cardiovascular;  Laterality: N/A;   OSTEOCHONDRAL DEFECT REPAIR/RECONSTRUCTION Left 11/29/2014   Procedure: LEFT ANKLE MEDIAL MALLEOLUS OSTEOTOMY,AUTO GRAFT FROM CALCANEOUS;  OS TIBIA DENORO GRAFTING TALUS;  Surgeon: Wylene Simmer, MD;  Location: [place];  Service: Orthopedics;  Laterality: Left;   TEE WITHOUT CARDIOVERSION N/A 09/15/2019   Procedure: TRANSESOPHAGEAL ECHOCARDIOGRAM (TEE);  Surgeon: Gaye Pollack, MD;  Location: [place];  Service: Open Heart Surgery;  Laterality: N/A;   TUBAL LIGATION      Current Medications: Current Meds  Medication Sig   acetaminophen (TYLENOL) 500 MG tablet Take 500-1,000 mg by mouth every 6 (six) hours as needed for pain (for pain.).   amLODipine (NORVASC) 2.5 MG tablet Take 1 tablet (2.5 mg total) by mouth daily.   aspirin EC 81 MG tablet Take 1 tablet (81 mg total) by mouth daily.   buPROPion (WELLBUTRIN XL) 300 MG 24 hr tablet Take 300 mg by mouth daily.   clopidogrel (PLAVIX) 75 MG tablet Take 75 mg by mouth daily.   furosemide (LASIX) 20 MG tablet Take 1 tablet (20 mg total) by mouth daily.   isosorbide  mononitrate (IMDUR) 60 MG 24 hr tablet Take 1 tablet (60 mg total) by mouth daily.   lactulose, encephalopathy, (CHRONULAC) 10 GM/15ML SOLN Take 30 ML by mouth three times a week as needed for constipation   levothyroxine (SYNTHROID) 50 MCG tablet Take 50 mcg by mouth daily before breakfast. On Saturday and Sunday   levothyroxine (SYNTHROID) 75 MCG tablet Take 75 mcg by mouth daily at 12 noon. Monday through Friday   metoprolol succinate (TOPROL-XL) 50 MG 24 hr tablet Take 1 tablet (50 mg total) by mouth at bedtime. Take with or immediately following a meal.   nitroGLYCERIN (NITROSTAT) 0.4 MG SL tablet Place 1 tablet (0.4 mg total) under the tongue every 5 (five) minutes as needed for chest pain.   ondansetron (ZOFRAN) 4 MG tablet Take 4 mg by mouth 2 (two) times daily as needed for nausea/vomiting.   potassium chloride (KLOR-CON) 10 MEQ tablet Take 1 tablet (10 mEq total) by mouth daily.   ranolazine (  RANEXA) 1000 MG SR tablet Take 1 tablet (1,000 mg total) by mouth 2 (two) times daily.   rOPINIRole (REQUIP) 1 MG tablet Take 1 mg by mouth 3 (three) times daily.   rOPINIRole (REQUIP) 4 MG tablet Take 4 mg by mouth at bedtime.   rosuvastatin (CRESTOR) 40 MG tablet Take 40 mg by mouth daily.   topiramate (TOPAMAX) 100 MG tablet Take 100 mg by mouth 2 (two) times daily.   traZODone (DESYREL) 150 MG tablet Take 150 mg by mouth at bedtime.     Allergies:   Carbidopa-levodopa, Prednisone, and Venlafaxine   Social History   Socioeconomic History   Marital status: Married    Spouse name: Not on file   Number of children: Not on file   Years of education: Not on file   Highest education level: Not on file  Occupational History   Not on file  Tobacco Use   Smoking status: Former    Pack years: 0.00   Smokeless tobacco: Never  Vaping Use   Vaping Use: Every day   Substances: Nicotine  Substance and Sexual Activity   Alcohol use: No   Drug use: No   Sexual activity: Not on file    Comment:  e-cig  Other Topics Concern   Not on file  Social History Narrative   Not on file   Social Determinants of Health   Financial Resource Strain: Not on file  Food Insecurity: Not on file  Transportation Needs: Not on file  Physical Activity: Not on file  Stress: Not on file  Social Connections: Not on file     Family History: The patient's family history includes Asthma in her mother; COPD in her mother; Congestive Heart Failure in her father; Diabetes in her mother; Macular degeneration in her mother.  ROS:   Please see the history of present illness.    All other systems reviewed and are negative.  EKGs/Labs/Other Studies Reviewed:    The following studies were reviewed today: I discussed my findings with the patient at length.   Recent Labs: 01/01/2021: Magnesium 1.9 03/28/2021: ALT 10; BUN 19; Creatinine, Ser 1.88; Hemoglobin 11.6; Platelets 254; Potassium 4.3; Sodium 138; TSH 1.650  Recent Lipid Panel    Component Value Date/Time   CHOL 147 03/28/2021 0831   TRIG 147 03/28/2021 0831   HDL 71 03/28/2021 0831   CHOLHDL 2.1 03/28/2021 0831   CHOLHDL 3.3 09/18/2019 0355   VLDL 24 09/18/2019 0355   LDLCALC 51 03/28/2021 0831    Physical Exam:    VS:  BP 130/68   Pulse 83   Ht '5\' 3"'$  (1.6 m)   Wt 172 lb 3.2 oz (78.1 kg)   SpO2 98%   BMI 30.50 kg/m     Wt Readings from Last 3 Encounters:  04/01/21 172 lb 3.2 oz (78.1 kg)  03/06/21 170 lb 12.8 oz (77.5 kg)  01/01/21 162 lb 6.4 oz (73.7 kg)     GEN: Patient is in no acute distress HEENT: Normal NECK: No JVD; No carotid bruits LYMPHATICS: No lymphadenopathy CARDIAC: Hear sounds regular, 2/6 systolic murmur at the apex. RESPIRATORY:  Clear to auscultation without rales, wheezing or rhonchi  ABDOMEN: Soft, non-tender, non-distended MUSCULOSKELETAL: Pedal edema left greater than right  No deformity  SKIN: Warm and dry NEUROLOGIC:  Alert and oriented x 3 PSYCHIATRIC:  Normal affect   Signed, Jenean Lindau,  MD  04/01/2021 1:27 PM    [facility]

## 2021-04-02 ENCOUNTER — Other Ambulatory Visit (HOSPITAL_BASED_OUTPATIENT_CLINIC_OR_DEPARTMENT_OTHER): Payer: Self-pay

## 2021-04-02 ENCOUNTER — Ambulatory Visit: Payer: 59 | Attending: Internal Medicine

## 2021-04-02 DIAGNOSIS — Z23 Encounter for immunization: Secondary | ICD-10-CM

## 2021-04-02 DIAGNOSIS — R600 Localized edema: Secondary | ICD-10-CM

## 2021-04-02 MED ORDER — METOLAZONE 2.5 MG PO TABS
2.5000 mg | ORAL_TABLET | Freq: Every day | ORAL | 3 refills | Status: DC
  Filled 2021-04-02: qty 30, 30d supply, fill #0
  Filled 2021-05-05: qty 5, 5d supply, fill #1
  Filled 2021-05-07: qty 25, 25d supply, fill #1
  Filled 2021-06-17: qty 30, 30d supply, fill #2

## 2021-04-02 NOTE — Progress Notes (Signed)
   Covid-19 Vaccination Clinic  Name:  Meghan Terry    MRN: [id number] DOB: [birth year]  04/02/2021  Meghan Terry was observed post Covid-19 immunization for 15 minutes without incident. She was provided with Vaccine Information Sheet and instruction to access the V-Safe system.   Meghan Terry was instructed to call 911 with any severe reactions post vaccine: Difficulty breathing  Swelling of face and throat  A fast heartbeat  A bad rash all over body  Dizziness and weakness   Immunizations Administered     Name Date Dose VIS Date Route   Moderna Covid-19 Booster Vaccine 04/02/2021 11:19 AM 0.25 mL 07/17/2020 Intramuscular   Manufacturer: Moderna   Lot: [id number]   [place][id number]

## 2021-04-03 ENCOUNTER — Other Ambulatory Visit: Payer: Self-pay | Admitting: Medical

## 2021-04-03 ENCOUNTER — Other Ambulatory Visit (HOSPITAL_BASED_OUTPATIENT_CLINIC_OR_DEPARTMENT_OTHER): Payer: Self-pay

## 2021-04-03 MED ORDER — ROSUVASTATIN CALCIUM 40 MG PO TABS
40.0000 mg | ORAL_TABLET | Freq: Every day | ORAL | 3 refills | Status: DC
  Filled 2021-04-03: qty 90, 90d supply, fill #0

## 2021-04-04 ENCOUNTER — Other Ambulatory Visit (HOSPITAL_BASED_OUTPATIENT_CLINIC_OR_DEPARTMENT_OTHER): Payer: Self-pay

## 2021-04-04 MED ORDER — COVID-19 MRNA VACC (MODERNA) 100 MCG/0.5ML IM SUSP
INTRAMUSCULAR | 0 refills | Status: DC
  Filled 2021-04-04: qty 0.25, 1d supply, fill #0

## 2021-04-08 LAB — BASIC METABOLIC PANEL
BUN/Creatinine Ratio: 14 (ref 12–28)
BUN: 28 mg/dL — ABNORMAL HIGH (ref 8–27)
CO2: 21 mmol/L (ref 20–29)
Calcium: 8.8 mg/dL (ref 8.7–10.3)
Chloride: 98 mmol/L (ref 96–106)
Creatinine, Ser: 1.95 mg/dL — ABNORMAL HIGH (ref 0.57–1.00)
Glucose: 247 mg/dL — ABNORMAL HIGH (ref 65–99)
Potassium: 3.5 mmol/L (ref 3.5–5.2)
Sodium: 135 mmol/L (ref 134–144)
eGFR: 29 mL/min/{1.73_m2} — ABNORMAL LOW (ref >59)

## 2021-04-16 ENCOUNTER — Other Ambulatory Visit (HOSPITAL_BASED_OUTPATIENT_CLINIC_OR_DEPARTMENT_OTHER): Payer: Self-pay

## 2021-04-17 ENCOUNTER — Other Ambulatory Visit (HOSPITAL_BASED_OUTPATIENT_CLINIC_OR_DEPARTMENT_OTHER): Payer: Self-pay

## 2021-04-17 ENCOUNTER — Other Ambulatory Visit: Payer: Self-pay | Admitting: Cardiology

## 2021-04-18 ENCOUNTER — Other Ambulatory Visit (HOSPITAL_BASED_OUTPATIENT_CLINIC_OR_DEPARTMENT_OTHER): Payer: Self-pay

## 2021-04-21 ENCOUNTER — Other Ambulatory Visit (HOSPITAL_BASED_OUTPATIENT_CLINIC_OR_DEPARTMENT_OTHER): Payer: Self-pay

## 2021-04-21 MED ORDER — ROPINIROLE HCL 1 MG PO TABS
ORAL_TABLET | ORAL | 2 refills | Status: DC
  Filled 2021-04-21: qty 270, 90d supply, fill #0
  Filled 2021-07-17: qty 270, 90d supply, fill #1
  Filled 2021-10-12: qty 270, 90d supply, fill #2

## 2021-04-23 ENCOUNTER — Other Ambulatory Visit (HOSPITAL_BASED_OUTPATIENT_CLINIC_OR_DEPARTMENT_OTHER): Payer: Self-pay

## 2021-04-24 ENCOUNTER — Other Ambulatory Visit (HOSPITAL_BASED_OUTPATIENT_CLINIC_OR_DEPARTMENT_OTHER): Payer: Self-pay

## 2021-04-24 MED ORDER — NITROFURANTOIN MONOHYD MACRO 100 MG PO CAPS
ORAL_CAPSULE | ORAL | 0 refills | Status: DC
  Filled 2021-04-24 – 2021-04-25 (×2): qty 14, 7d supply, fill #0

## 2021-04-25 ENCOUNTER — Other Ambulatory Visit: Payer: Self-pay | Admitting: Cardiology

## 2021-04-25 ENCOUNTER — Other Ambulatory Visit (HOSPITAL_BASED_OUTPATIENT_CLINIC_OR_DEPARTMENT_OTHER): Payer: Self-pay

## 2021-04-25 MED ORDER — SULFAMETHOXAZOLE-TRIMETHOPRIM 800-160 MG PO TABS
ORAL_TABLET | ORAL | 0 refills | Status: DC
  Filled 2021-04-25: qty 14, 7d supply, fill #0

## 2021-04-25 MED ORDER — RANOLAZINE ER 1000 MG PO TB12
1000.0000 mg | ORAL_TABLET | Freq: Two times a day (BID) | ORAL | 0 refills | Status: DC
  Filled 2021-04-25 (×2): qty 180, 90d supply, fill #0

## 2021-04-28 ENCOUNTER — Other Ambulatory Visit (HOSPITAL_BASED_OUTPATIENT_CLINIC_OR_DEPARTMENT_OTHER): Payer: Self-pay

## 2021-04-30 ENCOUNTER — Other Ambulatory Visit (HOSPITAL_BASED_OUTPATIENT_CLINIC_OR_DEPARTMENT_OTHER): Payer: Self-pay

## 2021-05-05 ENCOUNTER — Other Ambulatory Visit: Payer: Self-pay | Admitting: Cardiology

## 2021-05-05 ENCOUNTER — Other Ambulatory Visit: Payer: Self-pay

## 2021-05-05 ENCOUNTER — Other Ambulatory Visit (HOSPITAL_BASED_OUTPATIENT_CLINIC_OR_DEPARTMENT_OTHER): Payer: Self-pay

## 2021-05-05 MED ORDER — BUPROPION HCL ER (XL) 300 MG PO TB24
300.0000 mg | ORAL_TABLET | Freq: Every day | ORAL | 0 refills | Status: DC
  Filled 2021-05-05: qty 90, 90d supply, fill #0

## 2021-05-06 ENCOUNTER — Other Ambulatory Visit (HOSPITAL_BASED_OUTPATIENT_CLINIC_OR_DEPARTMENT_OTHER): Payer: Self-pay

## 2021-05-06 MED ORDER — ONDANSETRON HCL 4 MG PO TABS
ORAL_TABLET | ORAL | 1 refills | Status: DC
  Filled 2021-05-06: qty 18, 21d supply, fill #0
  Filled 2021-05-07: qty 162, 81d supply, fill #1
  Filled 2021-05-07: qty 10, 5d supply, fill #1
  Filled 2021-09-11: qty 18, 21d supply, fill #1
  Filled 2021-09-25 – 2021-09-26 (×3): qty 18, 21d supply, fill #2
  Filled 2021-11-02: qty 18, 21d supply, fill #3
  Filled 2021-12-15: qty 18, 21d supply, fill #4
  Filled 2022-01-06: qty 18, 21d supply, fill #5
  Filled 2022-01-31: qty 18, 21d supply, fill #6
  Filled 2022-03-23: qty 18, 21d supply, fill #7
  Filled 2022-04-23: qty 18, 21d supply, fill #8

## 2021-05-06 MED ORDER — LEVOTHYROXINE SODIUM 50 MCG PO TABS
ORAL_TABLET | ORAL | 2 refills | Status: DC
  Filled 2021-05-06: qty 24, 90d supply, fill #0
  Filled 2021-07-22: qty 24, 90d supply, fill #1

## 2021-05-07 ENCOUNTER — Other Ambulatory Visit: Payer: Self-pay | Admitting: Cardiology

## 2021-05-07 ENCOUNTER — Other Ambulatory Visit (HOSPITAL_BASED_OUTPATIENT_CLINIC_OR_DEPARTMENT_OTHER): Payer: Self-pay

## 2021-05-07 MED ORDER — CLOPIDOGREL BISULFATE 75 MG PO TABS
ORAL_TABLET | ORAL | 2 refills | Status: DC
  Filled 2021-05-07: qty 90, 90d supply, fill #0
  Filled 2021-08-25: qty 90, 90d supply, fill #1
  Filled 2021-11-19: qty 90, 90d supply, fill #2

## 2021-05-07 NOTE — Telephone Encounter (Signed)
This medication has been refused twice, I do not see where it was discontinued by the provider. Is this patient still taking this medication? Please advise thank you.

## 2021-05-08 ENCOUNTER — Other Ambulatory Visit (HOSPITAL_BASED_OUTPATIENT_CLINIC_OR_DEPARTMENT_OTHER): Payer: Self-pay

## 2021-05-08 ENCOUNTER — Other Ambulatory Visit: Payer: Self-pay | Admitting: Cardiology

## 2021-05-08 MED ORDER — ISOSORBIDE MONONITRATE ER 60 MG PO TB24
60.0000 mg | ORAL_TABLET | Freq: Every day | ORAL | 2 refills | Status: DC
  Filled 2021-05-08: qty 90, 90d supply, fill #0
  Filled 2021-08-28: qty 90, 90d supply, fill #1

## 2021-05-08 MED ORDER — PROMETHAZINE HCL 25 MG PO TABS
ORAL_TABLET | ORAL | 3 refills | Status: DC
  Filled 2021-05-08: qty 60, 15d supply, fill #0

## 2021-05-08 MED ORDER — METOPROLOL SUCCINATE ER 50 MG PO TB24
50.0000 mg | ORAL_TABLET | Freq: Every day | ORAL | 2 refills | Status: DC
  Filled 2021-05-08: qty 90, 90d supply, fill #0
  Filled 2021-08-07: qty 90, 90d supply, fill #1
  Filled 2021-11-02: qty 90, 90d supply, fill #2

## 2021-05-09 ENCOUNTER — Other Ambulatory Visit (HOSPITAL_BASED_OUTPATIENT_CLINIC_OR_DEPARTMENT_OTHER): Payer: Self-pay

## 2021-05-14 ENCOUNTER — Other Ambulatory Visit: Payer: Self-pay | Admitting: Cardiology

## 2021-05-14 ENCOUNTER — Other Ambulatory Visit (HOSPITAL_BASED_OUTPATIENT_CLINIC_OR_DEPARTMENT_OTHER): Payer: Self-pay

## 2021-05-14 MED ORDER — POTASSIUM CHLORIDE ER 10 MEQ PO TBCR
10.0000 meq | EXTENDED_RELEASE_TABLET | Freq: Every day | ORAL | 3 refills | Status: DC
  Filled 2021-05-14: qty 30, 30d supply, fill #0
  Filled 2021-06-17: qty 30, 30d supply, fill #1

## 2021-05-14 MED ORDER — AMLODIPINE BESYLATE 2.5 MG PO TABS
2.5000 mg | ORAL_TABLET | Freq: Every day | ORAL | 2 refills | Status: DC
  Filled 2021-05-14: qty 30, 30d supply, fill #0
  Filled 2021-06-17: qty 30, 30d supply, fill #1

## 2021-05-19 ENCOUNTER — Other Ambulatory Visit (HOSPITAL_BASED_OUTPATIENT_CLINIC_OR_DEPARTMENT_OTHER): Payer: Self-pay

## 2021-05-23 ENCOUNTER — Other Ambulatory Visit (HOSPITAL_BASED_OUTPATIENT_CLINIC_OR_DEPARTMENT_OTHER): Payer: Self-pay

## 2021-05-23 MED ORDER — FUROSEMIDE 40 MG PO TABS
ORAL_TABLET | ORAL | 6 refills | Status: DC
  Filled 2021-05-23: qty 30, 30d supply, fill #0

## 2021-05-26 ENCOUNTER — Other Ambulatory Visit: Payer: Self-pay | Admitting: Internal Medicine

## 2021-05-26 ENCOUNTER — Other Ambulatory Visit (HOSPITAL_BASED_OUTPATIENT_CLINIC_OR_DEPARTMENT_OTHER): Payer: Self-pay

## 2021-05-26 DIAGNOSIS — N1832 Chronic kidney disease, stage 3b: Secondary | ICD-10-CM

## 2021-05-26 MED ORDER — BUPROPION HCL ER (XL) 300 MG PO TB24
ORAL_TABLET | ORAL | 3 refills | Status: DC
  Filled 2021-08-09: qty 90, 90d supply, fill #0
  Filled 2021-11-02: qty 90, 90d supply, fill #1
  Filled 2022-02-16: qty 90, 90d supply, fill #2
  Filled 2022-05-12: qty 90, 90d supply, fill #3

## 2021-05-26 MED ORDER — TRAZODONE HCL 150 MG PO TABS
ORAL_TABLET | ORAL | 3 refills | Status: DC
  Filled 2021-05-26: qty 90, 90d supply, fill #0
  Filled 2021-08-25: qty 90, 90d supply, fill #1
  Filled 2021-11-19: qty 90, 90d supply, fill #2
  Filled 2022-02-16: qty 90, 90d supply, fill #3

## 2021-05-26 MED ORDER — CLONAZEPAM 0.5 MG PO TABS
ORAL_TABLET | ORAL | 0 refills | Status: DC
  Filled 2021-05-26: qty 30, 30d supply, fill #0

## 2021-05-27 ENCOUNTER — Other Ambulatory Visit (HOSPITAL_BASED_OUTPATIENT_CLINIC_OR_DEPARTMENT_OTHER): Payer: Self-pay

## 2021-05-27 MED ORDER — GLIMEPIRIDE 1 MG PO TABS
ORAL_TABLET | ORAL | 3 refills | Status: DC
  Filled 2021-05-27: qty 30, 30d supply, fill #0
  Filled 2021-06-26: qty 30, 30d supply, fill #1
  Filled 2021-07-22: qty 30, 30d supply, fill #2
  Filled 2021-08-28: qty 30, 30d supply, fill #3

## 2021-05-27 MED ORDER — VITAMIN D3 25 MCG (1000 UNIT) PO TABS
ORAL_TABLET | ORAL | 3 refills | Status: DC
  Filled 2021-05-27: qty 200, 100d supply, fill #0
  Filled 2021-09-25: qty 200, 100d supply, fill #1
  Filled 2022-02-02: qty 200, 100d supply, fill #2
  Filled 2022-05-12: qty 200, 100d supply, fill #3

## 2021-05-28 ENCOUNTER — Other Ambulatory Visit: Payer: Self-pay | Admitting: Student

## 2021-05-28 DIAGNOSIS — Z1231 Encounter for screening mammogram for malignant neoplasm of breast: Secondary | ICD-10-CM

## 2021-06-04 ENCOUNTER — Ambulatory Visit
Admission: RE | Admit: 2021-06-04 | Discharge: 2021-06-04 | Disposition: A | Discharge status: Home / Self Care | Payer: 59 | Source: Ambulatory Visit | Attending: Internal Medicine | Admitting: Internal Medicine

## 2021-06-04 ENCOUNTER — Other Ambulatory Visit: Payer: Self-pay

## 2021-06-04 DIAGNOSIS — N1832 Chronic kidney disease, stage 3b: Secondary | ICD-10-CM

## 2021-06-08 ENCOUNTER — Emergency Department (HOSPITAL_COMMUNITY): Payer: 59

## 2021-06-08 ENCOUNTER — Encounter (HOSPITAL_COMMUNITY): Payer: Self-pay | Admitting: Emergency Medicine

## 2021-06-08 ENCOUNTER — Inpatient Hospital Stay (HOSPITAL_COMMUNITY)
Admission: EM | Admit: 2021-06-08 | Discharge: 2021-06-11 | DRG: 291 | Disposition: A | Discharge status: Home Health | Payer: 59 | Attending: Family Medicine | Admitting: Family Medicine

## 2021-06-08 ENCOUNTER — Other Ambulatory Visit: Payer: Self-pay

## 2021-06-08 DIAGNOSIS — Z8249 Family history of ischemic heart disease and other diseases of the circulatory system: Secondary | ICD-10-CM | POA: Diagnosis not present

## 2021-06-08 DIAGNOSIS — E039 Hypothyroidism, unspecified: Secondary | ICD-10-CM | POA: Diagnosis present

## 2021-06-08 DIAGNOSIS — I130 Hypertensive heart and chronic kidney disease with heart failure and stage 1 through stage 4 chronic kidney disease, or unspecified chronic kidney disease: Principal | ICD-10-CM | POA: Diagnosis present

## 2021-06-08 DIAGNOSIS — I509 Heart failure, unspecified: Secondary | ICD-10-CM

## 2021-06-08 DIAGNOSIS — Z7984 Long term (current) use of oral hypoglycemic drugs: Secondary | ICD-10-CM | POA: Diagnosis not present

## 2021-06-08 DIAGNOSIS — R0602 Shortness of breath: Secondary | ICD-10-CM

## 2021-06-08 DIAGNOSIS — R609 Edema, unspecified: Secondary | ICD-10-CM | POA: Diagnosis not present

## 2021-06-08 DIAGNOSIS — Z9111 Patient's noncompliance with dietary regimen: Secondary | ICD-10-CM | POA: Diagnosis not present

## 2021-06-08 DIAGNOSIS — Z79899 Other long term (current) drug therapy: Secondary | ICD-10-CM

## 2021-06-08 DIAGNOSIS — Z20822 Contact with and (suspected) exposure to covid-19: Secondary | ICD-10-CM | POA: Diagnosis present

## 2021-06-08 DIAGNOSIS — Z833 Family history of diabetes mellitus: Secondary | ICD-10-CM | POA: Diagnosis not present

## 2021-06-08 DIAGNOSIS — I5033 Acute on chronic diastolic (congestive) heart failure: Secondary | ICD-10-CM | POA: Diagnosis present

## 2021-06-08 DIAGNOSIS — Z87891 Personal history of nicotine dependence: Secondary | ICD-10-CM

## 2021-06-08 DIAGNOSIS — J810 Acute pulmonary edema: Secondary | ICD-10-CM | POA: Insufficient documentation

## 2021-06-08 DIAGNOSIS — I5031 Acute diastolic (congestive) heart failure: Secondary | ICD-10-CM

## 2021-06-08 DIAGNOSIS — I25118 Atherosclerotic heart disease of native coronary artery with other forms of angina pectoris: Secondary | ICD-10-CM | POA: Diagnosis present

## 2021-06-08 DIAGNOSIS — Z955 Presence of coronary angioplasty implant and graft: Secondary | ICD-10-CM | POA: Diagnosis not present

## 2021-06-08 DIAGNOSIS — Z7902 Long term (current) use of antithrombotics/antiplatelets: Secondary | ICD-10-CM

## 2021-06-08 DIAGNOSIS — G2581 Restless legs syndrome: Secondary | ICD-10-CM | POA: Diagnosis present

## 2021-06-08 DIAGNOSIS — E785 Hyperlipidemia, unspecified: Secondary | ICD-10-CM | POA: Diagnosis present

## 2021-06-08 DIAGNOSIS — E1122 Type 2 diabetes mellitus with diabetic chronic kidney disease: Secondary | ICD-10-CM | POA: Diagnosis present

## 2021-06-08 DIAGNOSIS — F419 Anxiety disorder, unspecified: Secondary | ICD-10-CM | POA: Diagnosis present

## 2021-06-08 DIAGNOSIS — Z888 Allergy status to other drugs, medicaments and biological substances status: Secondary | ICD-10-CM

## 2021-06-08 DIAGNOSIS — Z7982 Long term (current) use of aspirin: Secondary | ICD-10-CM | POA: Diagnosis not present

## 2021-06-08 DIAGNOSIS — F32A Depression, unspecified: Secondary | ICD-10-CM | POA: Diagnosis present

## 2021-06-08 DIAGNOSIS — N1832 Chronic kidney disease, stage 3b: Secondary | ICD-10-CM | POA: Diagnosis present

## 2021-06-08 DIAGNOSIS — Z79890 Hormone replacement therapy (postmenopausal): Secondary | ICD-10-CM

## 2021-06-08 DIAGNOSIS — Z951 Presence of aortocoronary bypass graft: Secondary | ICD-10-CM

## 2021-06-08 HISTORY — DX: Acute diastolic (congestive) heart failure: I50.31

## 2021-06-08 HISTORY — DX: Heart failure, unspecified: I50.9

## 2021-06-08 LAB — TSH: TSH: 4.261 u[IU]/mL (ref 0.350–4.500)

## 2021-06-08 LAB — BASIC METABOLIC PANEL
Anion gap: 6 (ref 5–15)
BUN: 24 mg/dL — ABNORMAL HIGH (ref 8–23)
CO2: 25 mmol/L (ref 22–32)
Calcium: 8.8 mg/dL — ABNORMAL LOW (ref 8.9–10.3)
Chloride: 108 mmol/L (ref 98–111)
Creatinine, Ser: 1.95 mg/dL — ABNORMAL HIGH (ref 0.44–1.00)
GFR, Estimated: 29 mL/min — ABNORMAL LOW (ref >60)
Glucose, Bld: 131 mg/dL — ABNORMAL HIGH (ref 70–99)
Potassium: 4.1 mmol/L (ref 3.5–5.1)
Sodium: 139 mmol/L (ref 135–145)

## 2021-06-08 LAB — CBC
HCT: 33.4 % — ABNORMAL LOW (ref 36.0–46.0)
Hemoglobin: 10.5 g/dL — ABNORMAL LOW (ref 12.0–15.0)
MCH: 29.8 pg (ref 26.0–34.0)
MCHC: 31.4 g/dL (ref 30.0–36.0)
MCV: 94.9 fL (ref 80.0–100.0)
Platelets: 241 10*3/uL (ref 150–400)
RBC: 3.52 MIL/uL — ABNORMAL LOW (ref 3.87–5.11)
RDW: 14.1 % (ref 11.5–15.5)
WBC: 8.5 10*3/uL (ref 4.0–10.5)
nRBC: 0 % (ref 0.0–0.2)

## 2021-06-08 LAB — RESP PANEL BY RT-PCR (FLU A&B, COVID) ARPGX2
Influenza A by PCR: NEGATIVE
Influenza B by PCR: NEGATIVE
SARS Coronavirus 2 by RT PCR: NEGATIVE

## 2021-06-08 LAB — TROPONIN I (HIGH SENSITIVITY)
Troponin I (High Sensitivity): 5 ng/L (ref <18)
Troponin I (High Sensitivity): 5 ng/L (ref <18)

## 2021-06-08 LAB — HEMOGLOBIN A1C
Hgb A1c MFr Bld: 6.7 % — ABNORMAL HIGH (ref 4.8–5.6)
Mean Plasma Glucose: 145.59 mg/dL

## 2021-06-08 LAB — BRAIN NATRIURETIC PEPTIDE: B Natriuretic Peptide: 126.2 pg/mL — ABNORMAL HIGH (ref 0.0–100.0)

## 2021-06-08 LAB — CBG MONITORING, ED: Glucose-Capillary: 173 mg/dL — ABNORMAL HIGH (ref 70–99)

## 2021-06-08 MED ORDER — INSULIN ASPART 100 UNIT/ML IJ SOLN
0.0000 [IU] | Freq: Three times a day (TID) | INTRAMUSCULAR | Status: DC
  Administered 2021-06-09: 2 [IU] via SUBCUTANEOUS
  Administered 2021-06-09: 1 [IU] via SUBCUTANEOUS
  Administered 2021-06-10: 2 [IU] via SUBCUTANEOUS
  Administered 2021-06-10 – 2021-06-11 (×2): 1 [IU] via SUBCUTANEOUS

## 2021-06-08 MED ORDER — METOPROLOL SUCCINATE ER 50 MG PO TB24
50.0000 mg | ORAL_TABLET | Freq: Every day | ORAL | Status: DC
  Administered 2021-06-08 – 2021-06-10 (×3): 50 mg via ORAL
  Filled 2021-06-08 (×2): qty 1
  Filled 2021-06-08: qty 2

## 2021-06-08 MED ORDER — HEPARIN SODIUM (PORCINE) 5000 UNIT/ML IJ SOLN
5000.0000 [IU] | Freq: Three times a day (TID) | INTRAMUSCULAR | Status: DC
  Administered 2021-06-08 – 2021-06-10 (×5): 5000 [IU] via SUBCUTANEOUS
  Filled 2021-06-08 (×7): qty 1

## 2021-06-08 MED ORDER — RANOLAZINE ER 500 MG PO TB12
1000.0000 mg | ORAL_TABLET | Freq: Two times a day (BID) | ORAL | Status: DC
  Administered 2021-06-09 – 2021-06-10 (×4): 1000 mg via ORAL
  Filled 2021-06-08 (×6): qty 2

## 2021-06-08 MED ORDER — GLIMEPIRIDE 1 MG PO TABS
1.0000 mg | ORAL_TABLET | Freq: Every day | ORAL | Status: DC
  Administered 2021-06-09 – 2021-06-11 (×3): 1 mg via ORAL
  Filled 2021-06-08 (×3): qty 1

## 2021-06-08 MED ORDER — CLONAZEPAM 0.5 MG PO TABS
0.5000 mg | ORAL_TABLET | Freq: Every day | ORAL | Status: DC
  Administered 2021-06-08 – 2021-06-10 (×3): 0.5 mg via ORAL
  Filled 2021-06-08 (×3): qty 1

## 2021-06-08 MED ORDER — TRAZODONE HCL 50 MG PO TABS
150.0000 mg | ORAL_TABLET | Freq: Every day | ORAL | Status: DC
  Administered 2021-06-08 – 2021-06-10 (×3): 150 mg via ORAL
  Filled 2021-06-08: qty 1
  Filled 2021-06-08: qty 3
  Filled 2021-06-08: qty 1

## 2021-06-08 MED ORDER — FUROSEMIDE 10 MG/ML IJ SOLN
40.0000 mg | Freq: Two times a day (BID) | INTRAMUSCULAR | Status: DC
  Administered 2021-06-09 – 2021-06-11 (×5): 40 mg via INTRAVENOUS
  Filled 2021-06-08 (×5): qty 4

## 2021-06-08 MED ORDER — CLOPIDOGREL BISULFATE 75 MG PO TABS
75.0000 mg | ORAL_TABLET | Freq: Every day | ORAL | Status: DC
  Administered 2021-06-09 – 2021-06-11 (×3): 75 mg via ORAL
  Filled 2021-06-08 (×3): qty 1

## 2021-06-08 MED ORDER — ROSUVASTATIN CALCIUM 20 MG PO TABS
40.0000 mg | ORAL_TABLET | Freq: Every day | ORAL | Status: DC
  Administered 2021-06-09 – 2021-06-11 (×3): 40 mg via ORAL
  Filled 2021-06-08 (×4): qty 2

## 2021-06-08 MED ORDER — ROPINIROLE HCL 1 MG PO TABS
1.0000 mg | ORAL_TABLET | Freq: Three times a day (TID) | ORAL | Status: DC
  Administered 2021-06-09 – 2021-06-11 (×6): 1 mg via ORAL
  Filled 2021-06-08 (×9): qty 1

## 2021-06-08 MED ORDER — POTASSIUM CHLORIDE CRYS ER 10 MEQ PO TBCR
10.0000 meq | EXTENDED_RELEASE_TABLET | Freq: Every day | ORAL | Status: DC
  Administered 2021-06-09 – 2021-06-11 (×3): 10 meq via ORAL
  Filled 2021-06-08 (×5): qty 1

## 2021-06-08 MED ORDER — LACTULOSE 10 GM/15ML PO SOLN
10.0000 g | Freq: Every day | ORAL | Status: DC | PRN
  Filled 2021-06-08: qty 15

## 2021-06-08 MED ORDER — BUPROPION HCL ER (XL) 150 MG PO TB24
300.0000 mg | ORAL_TABLET | Freq: Every day | ORAL | Status: DC
  Administered 2021-06-09 – 2021-06-11 (×3): 300 mg via ORAL
  Filled 2021-06-08: qty 1
  Filled 2021-06-08 (×2): qty 2
  Filled 2021-06-08: qty 1

## 2021-06-08 MED ORDER — ROPINIROLE HCL 1 MG PO TABS
4.0000 mg | ORAL_TABLET | Freq: Every day | ORAL | Status: DC
  Administered 2021-06-08 – 2021-06-10 (×3): 4 mg via ORAL
  Filled 2021-06-08 (×4): qty 4

## 2021-06-08 MED ORDER — ISOSORBIDE MONONITRATE ER 60 MG PO TB24
60.0000 mg | ORAL_TABLET | Freq: Every day | ORAL | Status: DC
  Administered 2021-06-09 – 2021-06-11 (×3): 60 mg via ORAL
  Filled 2021-06-08 (×2): qty 1
  Filled 2021-06-08: qty 2

## 2021-06-08 MED ORDER — METOLAZONE 2.5 MG PO TABS: 2.5000 mg | ORAL_TABLET | Freq: Every day | ORAL | Status: DC

## 2021-06-08 MED ORDER — LEVOTHYROXINE SODIUM 50 MCG PO TABS
50.0000 ug | ORAL_TABLET | Freq: Every day | ORAL | Status: DC
  Administered 2021-06-09 – 2021-06-11 (×3): 50 ug via ORAL
  Filled 2021-06-08 (×2): qty 1
  Filled 2021-06-08: qty 2

## 2021-06-08 MED ORDER — ASPIRIN EC 81 MG PO TBEC
81.0000 mg | DELAYED_RELEASE_TABLET | Freq: Every day | ORAL | Status: DC
  Administered 2021-06-09 – 2021-06-11 (×3): 81 mg via ORAL
  Filled 2021-06-08 (×3): qty 1

## 2021-06-08 MED ORDER — ONDANSETRON HCL 4 MG PO TABS
4.0000 mg | ORAL_TABLET | Freq: Two times a day (BID) | ORAL | Status: DC | PRN
  Administered 2021-06-11: 4 mg via ORAL
  Filled 2021-06-08: qty 1

## 2021-06-08 MED ORDER — TOPIRAMATE 25 MG PO TABS
100.0000 mg | ORAL_TABLET | Freq: Two times a day (BID) | ORAL | Status: DC
  Administered 2021-06-09 – 2021-06-11 (×6): 100 mg via ORAL
  Filled 2021-06-08 (×6): qty 4

## 2021-06-08 MED ORDER — ACETAMINOPHEN 500 MG PO TABS
500.0000 mg | ORAL_TABLET | Freq: Four times a day (QID) | ORAL | Status: DC | PRN
  Administered 2021-06-09: 1000 mg via ORAL
  Administered 2021-06-10: 500 mg via ORAL
  Filled 2021-06-08: qty 2
  Filled 2021-06-08: qty 1

## 2021-06-08 MED ORDER — FUROSEMIDE 10 MG/ML IJ SOLN
40.0000 mg | Freq: Once | INTRAMUSCULAR | Status: AC
  Administered 2021-06-08: 40 mg via INTRAVENOUS
  Filled 2021-06-08: qty 4

## 2021-06-08 NOTE — H&P (Signed)
History and Physical    Meghan Terry [id number] DOB: Aug 23, 1961 DOA: 06/08/2021  PCP: Garwin Brothers, MD (Confirm with patient/family/NH records and if not entered, this has to be entered at [facility] point of entry) Patient coming from: Home  I have personally briefly reviewed patient's old medical records in [place]  Chief Complaint: Swelled up.  HPI: Meghan Terry is a 60 y.o. female with medical history significant of chronic diastolic CHF, CKD stage IIIb, CAD s/p CABG and stenting, HTN, HLD, IIDM, hypothyroidism, anxiety/depression, came with fluid overload.  Patient has HISTORY of chronic diastolic CHF, LVEF> [id number] and hyperdynamic left ventricle cardiac cath 12/30/20, she also has a history of refractory hypertension on multiple BP meds.  Started 3 months ago she started to have increasing leg swelling, her cardiology started her on p.o. Lasix 20 mg daily about 3 months ago, and she remains rather stable, she weighs herself every day and at baseline weight is 160 pounds. Last 2 weeks, she with next gross 20+ lbs  weight gaining, she contacted her cardiology, will increase Lasix from 20 to 40 mg daily.  Despite patient gained >7 lbs in the last few days. She also experienced exertional dyspnea but no chest pain, cough, no fever.  At baseline, patient her blood pressure is stable, SBP 120s to 130s, she drinks 16 Oz cups x4 of water or tea  every day, which she reported as a measure "to help my kidney", she does not use any salt to cook.  ED Course: Overtly fluid overload, blood pressure stable, chest x-ray showed mild pulmonary edema.  Creatinine 1.9 which is similar to her baseline.  Review of Systems: As per HPI otherwise 14 point review of systems negative.    Past Medical History:  Diagnosis Date   Angina pectoris ([place]) 11/17/2019   Anxiety    Atypical chest pain 12/30/2020   CKD (chronic kidney disease) stage 3b, GFR 30-59 ml/min 12/03/2019   Coronary artery disease    Coronary  artery disease involving native coronary artery of native heart with angina pectoris ([place]) 10/03/2019   Depression 04/25/2017   Diabetes mellitus due to underlying condition with unspecified complications ([place]) [id number]   Essential hypertension 09/05/2019   Ex-smoker 09/05/2019   Family history of coronary artery disease 09/05/2019   History of depression 04/15/2020   Hx of diabetes mellitus 04/15/2020   Hyperlipidemia with target LDL less than 70 11/24/2019   Hypertension    Hypothyroid    Migraine 04/25/2017   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   Thyroid disease     Past Surgical History:  Procedure Laterality Date   ABDOMINAL HYSTERECTOMY     APPENDECTOMY     CARDIAC CATHETERIZATION  11/22/2019   CHOLECYSTECTOMY     COLONOSCOPY     CORONARY ARTERY BYPASS GRAFT N/A 09/15/2019   Procedure: CORONARY ARTERY BYPASS GRAFTING (CABG) times three on pump using left internal mammary artery and right and left greater saphenous veins harvested endoscopically;  Surgeon: Gaye Pollack, MD;  Location: [place];  Service: Open Heart Surgery;  Laterality: N/A;   CORONARY STENT INTERVENTION N/A 11/23/2019   Procedure: CORONARY STENT INTERVENTION;  Surgeon: [name], MD;  Location: [place] [facility];  Service: Cardiovascular;  Laterality: N/A;   KNEE ARTHROSCOPY     LEFT HEART CATH AND CORONARY ANGIOGRAPHY N/A 09/14/2019   Procedure: LEFT HEART CATH AND CORONARY ANGIOGRAPHY;  Surgeon: [name], MD;  Location: [place] [facility];  Service: Cardiovascular;  Laterality: N/A;   LEFT HEART CATH AND CORS/GRAFTS ANGIOGRAPHY N/A 11/22/2019   Procedure: LEFT HEART CATH AND CORS/GRAFTS ANGIOGRAPHY;  Surgeon: [name],  MD;  Location: [place] [facility];  Service: Cardiovascular;  Laterality: N/A;   LEFT HEART CATH AND CORS/GRAFTS ANGIOGRAPHY N/A 03/13/2020   Procedure: LEFT HEART CATH AND CORS/GRAFTS ANGIOGRAPHY;  Surgeon: Lorretta Harp, MD;  Location: [place] [facility];  Service: Cardiovascular;  Laterality: N/A;   LEFT HEART CATH AND CORS/GRAFTS ANGIOGRAPHY N/A 12/31/2020   Procedure: LEFT HEART CATH AND CORS/GRAFTS ANGIOGRAPHY;  Surgeon: Belva Crome, MD;  Location: [place] [facility];  Service: Cardiovascular;  Laterality: N/A;   OSTEOCHONDRAL DEFECT REPAIR/RECONSTRUCTION Left 11/29/2014   Procedure: LEFT ANKLE MEDIAL MALLEOLUS OSTEOTOMY,AUTO GRAFT FROM CALCANEOUS;  OS TIBIA DENORO GRAFTING TALUS;  Surgeon: Wylene Simmer, MD;  Location: [place];  Service: Orthopedics;  Laterality: Left;   TEE WITHOUT CARDIOVERSION N/A 09/15/2019   Procedure: TRANSESOPHAGEAL ECHOCARDIOGRAM (TEE);  Surgeon: Gaye Pollack, MD;  Location: [place];  Service: Open Heart Surgery;  Laterality: N/A;   TUBAL LIGATION       reports that she has quit smoking. She has never used smokeless tobacco. She reports that she does not drink alcohol and does not use drugs.  Allergies  Allergen Reactions   Carbidopa-Levodopa Other (See Comments)    Developed tics while taking    Prednisone Other (See Comments)    Turns red all over    Venlafaxine Other (See Comments)    Developed tics while taking     Family History  Problem Relation Age of Onset   Asthma Mother    COPD Mother    Diabetes Mother    Macular degeneration Mother    Congestive Heart Failure Father      Prior to Admission medications   Medication Sig Start Date End Date Taking? Authorizing Provider  acetaminophen (TYLENOL) 500 MG tablet Take 500-1,000 mg by mouth every 6 (six) hours as needed for pain (for pain.).    [provider]  amLODipine (NORVASC) 2.5 MG tablet Take 1 tablet (2.5 mg total) by mouth daily. 05/14/21   Revankar, Reita Cliche, MD  aspirin EC 81 MG tablet Take 1 tablet (81 mg total) by mouth daily. 09/05/19   Revankar, Reita Cliche, MD  buPROPion (WELLBUTRIN XL) 300 MG 24 hr tablet Take 300 mg by mouth daily.    [provider]  buPROPion (WELLBUTRIN XL) 300 MG 24 hr tablet TAKE 1 TABLET BY MOUTH DAILY 05/05/21     buPROPion (WELLBUTRIN XL) 300 MG 24 hr tablet Take 1 tablet by mouth daily 08/02/21     cholecalciferol (VITAMIN D) 25 MCG (1000 UNIT) tablet Take 2 tablets by mouth every day. 05/27/21     clonazePAM (KLONOPIN) 0.5 MG tablet Take 1 tablet by mouth daily at bedtime 05/26/21     clopidogrel (PLAVIX) 75 MG tablet TAKE 1 TABLET BY MOUTH DAILY 05/07/21   Revankar, Reita Cliche, MD  COVID-19 mRNA vaccine, Moderna, 100 MCG/0.5ML injection Inject into the muscle. 04/02/21   Carlyle Basques, MD  furosemide (LASIX) 40 MG tablet Take 1 tablet by mouth daily as needed for edema *  stop the metolazone* 05/23/21     glimepiride (AMARYL) 1 MG tablet take 1 tablet (1 mg) by mouth once daily for 30 days 05/27/21     isosorbide mononitrate (IMDUR) 60 MG 24 hr tablet Take 1 tablet (60 mg total) by mouth daily. 05/08/21   Revankar, Reita Cliche, MD  lactulose, encephalopathy, (CHRONULAC) 10 GM/15ML SOLN Take 30 ML by mouth three times a week as needed for constipation 02/20/21     levothyroxine (SYNTHROID) 50 MCG tablet Take 50 mcg by mouth daily before breakfast. On Saturday and Sunday    [provider]  levothyroxine (SYNTHROID) 50 MCG tablet TAKE 1 TABLET BY MOUTH ONCE DAILY ON AN EMPTY STOMACH 30 MINUTES BEFORE BREAKFAST ON SAT AND SUNDAY 05/06/21     levothyroxine (SYNTHROID) 75 MCG tablet Take 75 mcg by mouth daily at 12 noon. Monday through Friday    [provider]  metolazone (ZAROXOLYN) 2.5 MG tablet Take 1 tablet (2.5 mg total) by mouth daily. 04/02/21 07/01/21  Revankar, Reita Cliche, MD  metoprolol succinate (TOPROL-XL) 50 MG 24 hr tablet Take 1 tablet (50 mg total) by mouth at bedtime. Take with or immediately following a  meal. 05/08/21   Revankar, Reita Cliche, MD  nitroGLYCERIN (NITROSTAT) 0.4 MG SL tablet Place 1 tablet (0.4 mg total) under the tongue every 5 (five) minutes as needed for chest pain. 02/05/21   Revankar, Reita Cliche, MD  ondansetron (ZOFRAN) 4 MG tablet Take 4 mg by mouth 2 (two) times daily as needed for nausea/vomiting.    [provider]  ondansetron (ZOFRAN) 4 MG tablet take 1 tablet (4 mg) by mouth 2 times per day as needed for nausea 05/06/21     potassium chloride (KLOR-CON) 10 MEQ tablet Take 1 tablet (10 mEq total) by mouth daily. 05/14/21 08/12/21  Tobb, Kardie, DO  promethazine (PHENERGAN) 25 MG tablet take 1 tablet (25 mg) by oral route every 6 hours as needed for 30 days 05/08/21     ranolazine (RANEXA) 1000 MG SR tablet Take 1 tablet (1,000 mg total) by mouth 2 (two) times daily. 04/25/21 08/05/21  Revankar, Reita Cliche, MD  rOPINIRole (REQUIP) 1 MG tablet Take 1 mg by mouth 3 (three) times daily.    [provider]  rOPINIRole (REQUIP) 1 MG tablet Take 1 tablet by mouth 3 times daily 04/21/21     rOPINIRole (REQUIP) 4 MG tablet Take 4 mg by mouth at bedtime.    [provider]  rosuvastatin (CRESTOR) 40 MG tablet Take 40 mg by mouth daily.    [provider]  rosuvastatin (CRESTOR) 40 MG tablet Take 1 tablet (40 mg total) by mouth at bedtime. 04/03/21   Revankar, Reita Cliche, MD  sulfamethoxazole-trimethoprim (BACTRIM DS) 800-160 MG tablet Take 1 tablet by mouth every 12 hours for 7 days 04/25/21     topiramate (TOPAMAX) 100 MG tablet Take 100 mg by mouth 2 (two) times daily.    [provider]  traZODone (DESYREL) 150 MG tablet Take 150 mg by mouth at bedtime.    [provider]  traZODone (DESYREL) 150 MG tablet TAKE 1 TABLET BY MOUTH ONCE DAILY 05/26/21       Physical Exam: Vitals:   06/08/21 1528 06/08/21 1706  BP: (!) 130/59 120/63  Pulse: 92 84  Resp: 18 12  Temp: 98.1 F (36.7 C)   TempSrc: Oral   SpO2: 99% 100%    Constitutional: NAD,  calm, comfortable Vitals:   06/08/21 1528 06/08/21 1706  BP: (!) 130/59 120/63  Pulse: 92 84  Resp: 18 12  Temp: 98.1 F (36.7 C)   TempSrc: Oral   SpO2: 99% 100%   Eyes: PERRL, lids and conjunctivae normal ENMT: Mucous membranes are moist. Posterior pharynx clear of any exudate or lesions.Normal dentition.  Neck: normal, supple, no masses, no thyromegaly Respiratory: clear to auscultation bilaterally, no wheezing, fine crackles on B/L bases. Normal respiratory effort. No accessory muscle use.  Cardiovascular: Regular rate and rhythm, no murmurs / rubs / gallops. Anasarca. 2+ pedal pulses. No carotid bruits.  Abdomen: no tenderness, no masses palpated. No hepatosplenomegaly. Bowel sounds positive.  Musculoskeletal: no clubbing / cyanosis. No joint deformity upper and lower extremities. Good ROM, no contractures. Normal muscle tone.  Skin: no rashes, lesions, ulcers. No induration Neurologic: CN 2-12 grossly intact. Sensation intact, DTR normal. Strength 5/5 in all 4.  Psychiatric: Normal judgment and insight. Alert and oriented x 3. Normal mood.     Labs on Admission: I have personally reviewed following labs and imaging studies  CBC: Recent Labs  Lab 06/08/21 1620  WBC 8.5  HGB 10.5*  HCT 33.4*  MCV 94.9  PLT [id number]   Basic Metabolic Panel: Recent Labs  Lab 06/08/21 1620  NA 139  K 4.1  CL 108  CO2 25  GLUCOSE 131*  BUN 24*  CREATININE 1.95*  CALCIUM 8.8*   GFR: CrCl cannot be calculated (Unknown ideal weight.). Liver Function Tests: No results for input(s): AST, ALT, ALKPHOS, BILITOT, PROT, ALBUMIN in the last 168 hours. No results for input(s): LIPASE, AMYLASE in the last 168 hours. No results for input(s): AMMONIA in the last 168 hours. Coagulation Profile: No results for input(s): INR, PROTIME in the last 168 hours. Cardiac Enzymes: No results for input(s): CKTOTAL, CKMB, CKMBINDEX, TROPONINI in the last 168 hours. BNP (last 3 results) No results for  input(s): PROBNP in the last 8760 hours. HbA1C: No results for input(s): HGBA1C in the last 72 hours. CBG: No results for input(s): GLUCAP in the last 168 hours. Lipid Profile: No results for input(s): CHOL, HDL, LDLCALC, TRIG, CHOLHDL, LDLDIRECT in the last 72 hours. Thyroid Function Tests: No results for input(s): TSH, T4TOTAL, FREET4, T3FREE, THYROIDAB in the last 72 hours. Anemia Panel: No results for input(s): VITAMINB12, FOLATE, FERRITIN, TIBC, IRON, RETICCTPCT in the last 72 hours. Urine analysis:    Component Value Date/Time   COLORURINE STRAW (A) 09/14/2019 0005   APPEARANCEUR CLEAR 09/14/2019 0005   LABSPEC 1.008 09/14/2019 0005   PHURINE 7.0 09/14/2019 0005   GLUCOSEU NEGATIVE 09/14/2019 0005   HGBUR NEGATIVE 09/14/2019 0005   BILIRUBINUR NEGATIVE 09/14/2019 0005   KETONESUR NEGATIVE 09/14/2019 0005   PROTEINUR NEGATIVE 09/14/2019 0005   NITRITE NEGATIVE 09/14/2019 0005   LEUKOCYTESUR TRACE (A) 09/14/2019 0005    Radiological Exams on Admission: DG Chest 2 View  Result Date: 06/08/2021 CLINICAL DATA:  Bilateral lower extremity edema, short of breath, weight gain EXAM: CHEST - 2 VIEW COMPARISON:  12/29/2020 FINDINGS: Frontal and lateral views of the chest demonstrates stable postsurgical changes from CABG. The cardiac silhouette is unremarkable. There is central vascular congestion with diffuse interstitial prominence consistent with interstitial edema. No airspace disease, effusion, or pneumothorax. No acute bony abnormalities. IMPRESSION: 1. Mild volume overload and interstitial edema. Electronically Signed   By: [name] M.D.   On: 06/08/2021 17:06    EKG: Independently reviewed. Sinus, no acute ST-T changes.  Assessment/Plan Active Problems:   Acute diastolic CHF (congestive heart failure) ([place])  CHF (congestive heart failure) ([place])  (please populate well all problems here in Problem List. (For example, if patient is on BP meds at home and you resume or  decide to hold them, it is a problem that needs to be her. Same for CAD, COPD, HLD and so on)   Acute on chronic diastolic CHF decompensated -Discussed with patient family at bedside regarding importance of fluid restriction.  Patient's argument is that "need sufficient fluid to flush kidney", then I discussed with them about finding a balance at home estimated 1500-2000 ml/day for now as compared to her habit of >2000 ml intake, or if not able to, will have to be on a higher dosage of maintenance lasix -Consider Torsemide over Lasix to go home with. -Echo and cath done earlier this year, will not repeat at this time. -Daily weight, I/Os -DVT study -Check TSH level  CKD stage IIIb -Cre level stable, now has symptoms signs of fluid overload, diuresis as above. -Continue metolazone. -Renal ultrasound was done 3 days ago showed no obstruction.  HTN -Hold Norvasc given worsening of leg swelling -Continue metoprolol, Imdur, IV Lasix as above.  CAD with stable angina -Stable, no chest pain -Continue aspirin, Plavix, statin, and Ranexa  IIDM -Continue Amaryl -Sliding scale  Anxiety/depression -Continue SSRI  Hypothyroidism -Synthroid  DVT prophylaxis: Subcu heparin Code Status: Full code Family Communication: Daughter at bedside Disposition Plan: Expect 2 to 3 days hospital stay for aggressive IV diuresis Consults called: None Admission status: Telemetry admission  Lequita Halt MD [facility] Pager [phone]  06/08/2021, 7:04 PM

## 2021-06-08 NOTE — ED Triage Notes (Signed)
Pt c/o BLE extremity swelling, shortness of breath and a 7lb weight gain since yesterday. Pt reports chest pressure that started on arrival, took one NTG with some relief.

## 2021-06-08 NOTE — ED Provider Notes (Signed)
Emergency Department Provider Note   I have reviewed the triage vital signs and the nursing notes.   HISTORY  Chief Complaint Chest Pain   HPI Meghan Terry is a 60 y.o. female with past medical history reviewed below including CHF, CKD, CAD presents emergency department with increasing fluid retention.  She typically takes 20 mg of Lasix daily and is followed by the [facility] heart and vascular team.  She has noticed increased fluid retention up almost 20 pounds from her dry weight, including a 1 day weight gain of 7 pounds today.  She has had increased swelling in her legs and feels the swelling and distention is up into her thighs and lower abdomen at this point.  She reached out to her cardiologist last week who increased her Lasix to 40 mg daily which she is been taking for the past 7 days with no improvement.  She continues to make urine but not an increased amount.  With a 7 pound weight gain in 1 day today she presents to the emergency department with her family at bedside.  She is feeling significant dyspnea especially with exertion.  She did have some chest heaviness but states that its been constant over the past week and not particularly worse today. No fever or chills.    Past Medical History:  Diagnosis Date   Angina pectoris ([place]) 11/17/2019   Anxiety    Atypical chest pain 12/30/2020   CKD (chronic kidney disease) stage 3b, GFR 30-59 ml/min 12/03/2019   Coronary artery disease    Coronary artery disease involving native coronary artery of native heart with angina pectoris ([place]) 10/03/2019   Depression 04/25/2017   Diabetes mellitus due to underlying condition with unspecified complications ([place]) 99991111   Essential hypertension 09/05/2019   Ex-smoker 09/05/2019   Family history of coronary artery disease 09/05/2019   History of depression 04/15/2020   Hx of diabetes mellitus 04/15/2020   Hyperlipidemia with target LDL less than 70 11/24/2019   Hypertension    Hypothyroid     Migraine 04/25/2017   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   Thyroid disease     Patient Active Problem List   Diagnosis Date Noted   Acute diastolic CHF (congestive heart failure) ([place]) 06/08/2021   CHF (congestive heart failure) ([place]) 06/08/2021   Acute pulmonary edema (HCC)    RLS (restless legs syndrome)    Anxiety    Coronary artery disease    Hypertension    Thyroid disease    Atypical chest pain 12/30/2020   Restless legs syndrome 04/15/2020   History of depression 04/15/2020   Hx of diabetes mellitus 04/15/2020   CKD (chronic kidney disease) stage 3b, GFR 30-59 ml/min 12/03/2019   Hyperlipidemia with target LDL less than 70 11/24/2019   Presence of drug coated stent in right coronary artery 11/24/2019   Angina pectoris ([place]) 11/17/2019   Coronary artery disease involving native coronary artery of native heart with angina pectoris ([place]) 10/03/2019   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   Essential hypertension 09/05/2019   Diabetes mellitus due to underlying condition with unspecified complications ([place]) [id number]   Ex-smoker 09/05/2019   Family history of coronary artery disease 09/05/2019  Migraine 04/25/2017   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   Hypothyroid 04/25/2017   Depression 04/25/2017   Pyelonephritis 05/20/2016    Past Surgical History:  Procedure Laterality Date   ABDOMINAL HYSTERECTOMY     APPENDECTOMY     CARDIAC CATHETERIZATION  11/22/2019   CHOLECYSTECTOMY     COLONOSCOPY     CORONARY ARTERY BYPASS GRAFT N/A 09/15/2019   Procedure: CORONARY ARTERY BYPASS GRAFTING (CABG) times three on pump using left internal mammary artery and right and left greater saphenous veins harvested endoscopically;   Surgeon: Gaye Pollack, MD;  Location: [facility];  Service: Open Heart Surgery;  Laterality: N/A;   CORONARY STENT INTERVENTION N/A 11/23/2019   Procedure: CORONARY STENT INTERVENTION;  Surgeon: [name], MD;  Location: [place] [facility];  Service: Cardiovascular;  Laterality: N/A;   KNEE ARTHROSCOPY     LEFT HEART CATH AND CORONARY ANGIOGRAPHY N/A 09/14/2019   Procedure: LEFT HEART CATH AND CORONARY ANGIOGRAPHY;  Surgeon: [name], MD;  Location: [place] [facility];  Service: Cardiovascular;  Laterality: N/A;   LEFT HEART CATH AND CORS/GRAFTS ANGIOGRAPHY N/A 11/22/2019   Procedure: LEFT HEART CATH AND CORS/GRAFTS ANGIOGRAPHY;  Surgeon: [name], MD;  Location: [place] [facility];  Service: Cardiovascular;  Laterality: N/A;   LEFT HEART CATH AND CORS/GRAFTS ANGIOGRAPHY N/A 03/13/2020   Procedure: LEFT HEART CATH AND CORS/GRAFTS ANGIOGRAPHY;  Surgeon: Lorretta Harp, MD;  Location: [place] [facility];  Service: Cardiovascular;  Laterality: N/A;   LEFT HEART CATH AND CORS/GRAFTS ANGIOGRAPHY N/A 12/31/2020   Procedure: LEFT HEART CATH AND CORS/GRAFTS ANGIOGRAPHY;  Surgeon: Belva Crome, MD;  Location: [place] [facility];  Service: Cardiovascular;  Laterality: N/A;   OSTEOCHONDRAL DEFECT REPAIR/RECONSTRUCTION Left 11/29/2014   Procedure: LEFT ANKLE MEDIAL MALLEOLUS OSTEOTOMY,AUTO GRAFT FROM CALCANEOUS;  OS TIBIA DENORO GRAFTING TALUS;  Surgeon: Wylene Simmer, MD;  Location: [facility];  Service: Orthopedics;  Laterality: Left;   TEE WITHOUT CARDIOVERSION N/A 09/15/2019   Procedure: TRANSESOPHAGEAL ECHOCARDIOGRAM (TEE);  Surgeon: Gaye Pollack, MD;  Location: [facility];  Service: Open Heart Surgery;  Laterality: N/A;   TUBAL LIGATION      Allergies Carbidopa-levodopa, Prednisone, and Venlafaxine  Family History  Problem Relation Age of Onset   Asthma Mother    COPD Mother    Diabetes Mother    Macular degeneration Mother    Congestive Heart Failure Father      Social History Social History   Tobacco Use   Smoking status: Former    Packs/day: 1.00    Years: 47.00    Pack years: 47.00    Types: Cigarettes    Quit date: 09/15/2019    Years since quitting: 1.7   Smokeless tobacco: Never  Vaping Use   Vaping Use: Every day   Start date: 09/15/2019   Substances: Nicotine  Substance Use Topics   Alcohol use: No   Drug use: No    Review of Systems  Constitutional: No fever/chills Eyes: No visual changes. ENT: No sore throat. Cardiovascular: Positive chest pain and increased fluid retention.  Respiratory: Positive shortness of breath. Gastrointestinal: No abdominal pain.  No nausea, no vomiting.  No diarrhea.  No constipation. Genitourinary: Negative for dysuria. Musculoskeletal: Negative for back pain. Skin: Negative for rash. Neurological: Negative for headaches, focal weakness or numbness.  10-point ROS otherwise negative.  ____________________________________________   PHYSICAL EXAM:  VITAL SIGNS: ED Triage Vitals [06/08/21 1528]  Enc Vitals Group  BP (!) 130/59     Pulse Rate 92     Resp 18     Temp 98.1 F (36.7 C)     Temp Source Oral     SpO2 99 %   Constitutional: Alert and oriented. Well appearing and in no acute distress. Eyes: Conjunctivae are normal.  Head: Atraumatic. Nose: No congestion/rhinnorhea. Mouth/Throat: Mucous membranes are moist.  Neck: No stridor.  Cardiovascular: Normal rate, regular rhythm. Good peripheral circulation. Grossly normal heart sounds.   Respiratory: Normal respiratory effort.  No retractions. Lungs CTAB. Gastrointestinal: Soft and nontender. No distention.  Musculoskeletal: No lower extremity tenderness with 3+ pitting edema to the bilateral legs with some edema noted to the abdominal wall. No gross deformities of extremities. Neurologic:  Normal speech and language. No gross focal neurologic deficits are appreciated.  Skin:  Skin is warm, dry and intact. No rash  noted.  ____________________________________________   LABS (all labs ordered are listed, but only abnormal results are displayed)  Labs Reviewed  BASIC METABOLIC PANEL - Abnormal; Notable for the following components:      Result Value   Glucose, Bld 131 (*)    BUN 24 (*)    Creatinine, Ser 1.95 (*)    Calcium 8.8 (*)    GFR, Estimated 29 (*)    All other components within normal limits  CBC - Abnormal; Notable for the following components:   RBC 3.52 (*)    Hemoglobin 10.5 (*)    HCT 33.4 (*)    All other components within normal limits  BRAIN NATRIURETIC PEPTIDE - Abnormal; Notable for the following components:   B Natriuretic Peptide 126.2 (*)    All other components within normal limits  BASIC METABOLIC PANEL - Abnormal; Notable for the following components:   Glucose, Bld 132 (*)    BUN 25 (*)    Creatinine, Ser 1.94 (*)    Calcium 8.5 (*)    GFR, Estimated 29 (*)    All other components within normal limits  HEMOGLOBIN A1C - Abnormal; Notable for the following components:   Hgb A1c MFr Bld 6.7 (*)    All other components within normal limits  BASIC METABOLIC PANEL - Abnormal; Notable for the following components:   BUN 29 (*)    Creatinine, Ser 2.16 (*)    Calcium 8.8 (*)    GFR, Estimated 25 (*)    All other components within normal limits  GLUCOSE, CAPILLARY - Abnormal; Notable for the following components:   Glucose-Capillary 179 (*)    All other components within normal limits  GLUCOSE, CAPILLARY - Abnormal; Notable for the following components:   Glucose-Capillary 111 (*)    All other components within normal limits  GLUCOSE, CAPILLARY - Abnormal; Notable for the following components:   Glucose-Capillary 195 (*)    All other components within normal limits  GLUCOSE, CAPILLARY - Abnormal; Notable for the following components:   Glucose-Capillary 144 (*)    All other components within normal limits  BASIC METABOLIC PANEL - Abnormal; Notable for the  following components:   BUN 31 (*)    Creatinine, Ser 2.21 (*)    Calcium 8.5 (*)    GFR, Estimated 25 (*)    All other components within normal limits  GLUCOSE, CAPILLARY - Abnormal; Notable for the following components:   Glucose-Capillary 151 (*)    All other components within normal limits  GLUCOSE, CAPILLARY - Abnormal; Notable for the following components:   Glucose-Capillary 138 (*)  All other components within normal limits  CBG MONITORING, ED - Abnormal; Notable for the following components:   Glucose-Capillary 173 (*)    All other components within normal limits  CBG MONITORING, ED - Abnormal; Notable for the following components:   Glucose-Capillary 162 (*)    All other components within normal limits  CBG MONITORING, ED - Abnormal; Notable for the following components:   Glucose-Capillary 131 (*)    All other components within normal limits  RESP PANEL BY RT-PCR (FLU A&B, COVID) ARPGX2  TSH  HIV ANTIBODY (ROUTINE TESTING W REFLEX)  GLUCOSE, CAPILLARY  TROPONIN I (HIGH SENSITIVITY)  TROPONIN I (HIGH SENSITIVITY)   ____________________________________________  EKG   EKG Interpretation  Date/Time:  Sunday June 08 2021 15:24:50 EDT Ventricular Rate:  90 PR Interval:  140 QRS Duration: 82 QT Interval:  366 QTC Calculation: 447 R Axis:   80 Text Interpretation: Normal sinus rhythm Nonspecific ST abnormality Abnormal ECG Similar to prior tracings Confirmed by Nanda Quinton [phone]) on 06/08/2021 6:11:00 PM        ____________________________________________  RADIOLOGY  DG Chest 2 View  Result Date: 06/08/2021 CLINICAL DATA:  Bilateral lower extremity edema, short of breath, weight gain EXAM: CHEST - 2 VIEW COMPARISON:  12/29/2020 FINDINGS: Frontal and lateral views of the chest demonstrates stable postsurgical changes from CABG. The cardiac silhouette is unremarkable. There is central vascular congestion with diffuse interstitial prominence consistent  with interstitial edema. No airspace disease, effusion, or pneumothorax. No acute bony abnormalities. IMPRESSION: 1. Mild volume overload and interstitial edema. Electronically Signed   By: [name] M.D.   On: 06/08/2021 17:06    ____________________________________________   PROCEDURES  Procedure(s) performed:   Procedures  CRITICAL CARE Performed by: Margette Fast Total critical care time: 35 minutes Critical care time was exclusive of separately billable procedures and treating other patients. Critical care was necessary to treat or prevent imminent or life-threatening deterioration. Critical care was time spent personally by me on the following activities: development of treatment plan with patient and/or surrogate as well as nursing, discussions with consultants, evaluation of patient's response to treatment, examination of patient, obtaining history from patient or surrogate, ordering and performing treatments and interventions, ordering and review of laboratory studies, ordering and review of radiographic studies, pulse oximetry and re-evaluation of patient's condition.  Nanda Quinton, MD Emergency Medicine  ____________________________________________   INITIAL IMPRESSION / ASSESSMENT AND PLAN / ED COURSE  Pertinent labs & imaging results that were available during my care of the patient were reviewed by me and considered in my medical decision making (see chart for details).   Patient presents emergency department with increased leg swelling and shortness of breath.  She has some constant chest tightness but not particularly worse.  She is afebrile.  She is up almost 20 pounds from her dry weight despite increasing her Lasix over the past week as an outpatient.  Labs from the [facility] process reviewed showing CKD about at its baseline.  She does have pulmonary edema on chest x-ray and is dyspneic with even minimal exertion in the bed although not hypoxemic at rest.  Do feel she  would benefit from inpatient diuresis and close lab monitoring.  Labs reviewed. Plan for inpatient admit and diuresis.   Discussed patient's case with [facility] to request admission. Patient and family (if present) updated with plan. Care transferred to [facility] service.  I reviewed all nursing notes, vitals, pertinent old records, EKGs, labs, imaging (as available).  ____________________________________________  FINAL CLINICAL IMPRESSION(S) / ED DIAGNOSES  Final diagnoses:  Acute pulmonary edema (HCC)  SOB (shortness of breath)     MEDICATIONS GIVEN DURING THIS VISIT:  Medications  acetaminophen (TYLENOL) tablet 500-1,000 mg (500 mg Oral Given 06/10/21 1652)  aspirin EC tablet 81 mg (81 mg Oral Given 06/10/21 0928)  isosorbide mononitrate (IMDUR) 24 hr tablet 60 mg (60 mg Oral Given 06/10/21 0928)  metoprolol succinate (TOPROL-XL) 24 hr tablet 50 mg (50 mg Oral Given 06/10/21 2109)  rosuvastatin (CRESTOR) tablet 40 mg (40 mg Oral Given 06/10/21 0928)  buPROPion (WELLBUTRIN XL) 24 hr tablet 300 mg (300 mg Oral Given 06/10/21 0927)  traZODone (DESYREL) tablet 150 mg (150 mg Oral Given 06/10/21 2055)  glimepiride (AMARYL) tablet 1 mg (1 mg Oral Given 06/10/21 0933)  levothyroxine (SYNTHROID) tablet 50 mcg (50 mcg Oral Given 06/11/21 0613)  lactulose (CHRONULAC) 10 GM/15ML solution 10 g (has no administration in time range)  ondansetron (ZOFRAN) tablet 4 mg (4 mg Oral Given 06/11/21 0617)  clopidogrel (PLAVIX) tablet 75 mg (75 mg Oral Given 06/10/21 0928)  clonazePAM (KLONOPIN) tablet 0.5 mg (0.5 mg Oral Given 06/10/21 2054)  rOPINIRole (REQUIP) tablet 1 mg (1 mg Oral Given 06/10/21 1644)  rOPINIRole (REQUIP) tablet 4 mg (4 mg Oral Given 06/10/21 2054)  topiramate (TOPAMAX) tablet 100 mg (100 mg Oral Given 06/10/21 2055)  potassium chloride (KLOR-CON) CR tablet 10 mEq (10 mEq Oral Given 06/10/21 0928)  heparin injection 5,000 Units (5,000 Units Subcutaneous Patient Refused/Not Given 06/11/21 0619)   furosemide (LASIX) injection 40 mg (40 mg Intravenous Given 06/10/21 1644)  insulin aspart (novoLOG) injection 0-9 Units (1 Units Subcutaneous Given 06/11/21 [id number])  influenza vac split quadrivalent PF (FLUARIX) injection 0.5 mL (has no administration in time range)  ranolazine (RANEXA) 12 hr tablet 500 mg (500 mg Oral Given 06/10/21 2054)  furosemide (LASIX) injection 40 mg (40 mg Intravenous Given 06/08/21 1910)      Note:  This document was prepared using Dragon voice recognition software and may include unintentional dictation errors.  Nanda Quinton, MD, [facility] Emergency Medicine    Adrinne Sze, [facility], MD 06/11/21 [phone]

## 2021-06-08 NOTE — ED Notes (Signed)
Report given to RN

## 2021-06-09 ENCOUNTER — Inpatient Hospital Stay (HOSPITAL_COMMUNITY): Payer: 59

## 2021-06-09 ENCOUNTER — Encounter (HOSPITAL_COMMUNITY): Payer: Self-pay | Admitting: Internal Medicine

## 2021-06-09 DIAGNOSIS — R609 Edema, unspecified: Secondary | ICD-10-CM

## 2021-06-09 LAB — CBG MONITORING, ED
Glucose-Capillary: 162 mg/dL — ABNORMAL HIGH (ref 70–99)
Glucose-Capillary: 131 mg/dL — ABNORMAL HIGH (ref 70–99)

## 2021-06-09 LAB — HIV ANTIBODY (ROUTINE TESTING W REFLEX): HIV Screen 4th Generation wRfx: NONREACTIVE

## 2021-06-09 LAB — BASIC METABOLIC PANEL
Anion gap: 5 (ref 5–15)
BUN: 25 mg/dL — ABNORMAL HIGH (ref 8–23)
CO2: 25 mmol/L (ref 22–32)
Calcium: 8.5 mg/dL — ABNORMAL LOW (ref 8.9–10.3)
Chloride: 108 mmol/L (ref 98–111)
Creatinine, Ser: 1.94 mg/dL — ABNORMAL HIGH (ref 0.44–1.00)
GFR, Estimated: 29 mL/min — ABNORMAL LOW (ref >60)
Glucose, Bld: 132 mg/dL — ABNORMAL HIGH (ref 70–99)
Potassium: 3.8 mmol/L (ref 3.5–5.1)
Sodium: 138 mmol/L (ref 135–145)

## 2021-06-09 LAB — GLUCOSE, CAPILLARY
Glucose-Capillary: 95 mg/dL (ref 70–99)
Glucose-Capillary: 179 mg/dL — ABNORMAL HIGH (ref 70–99)

## 2021-06-09 MED ORDER — INFLUENZA VAC SPLIT QUAD 0.5 ML IM SUSY
0.5000 mL | PREFILLED_SYRINGE | INTRAMUSCULAR | Status: DC
  Filled 2021-06-09: qty 0.5

## 2021-06-09 NOTE — ED Notes (Signed)
Unhooked the monitor so patient could get up to the bedside toilet patient has call bell in reach

## 2021-06-09 NOTE — Progress Notes (Signed)
BLE venous duplex has been completed.  Results can be found under chart review under CV PROC. 06/09/2021 9:53 AM Cassadie Pankonin RVT, RDMS

## 2021-06-09 NOTE — Progress Notes (Signed)
[facility] PROGRESS NOTE    Progress Note  Meghan Terry  [id number] DOB: 10/18/1960 DOA: 06/08/2021 PCP: Garwin Brothers, MD     Brief Narrative:   Meghan Terry is an 60 y.o. female past medical history significant for chronic diastolic heart failure, refractory essential hypertension, chronic kidney disease stage IIIb, CAD status post CABG and stenting, insulin-dependent diabetes mellitus, hypothyroidism later started getting lower extremity swelling about 3 weeks ago her cardiologist started her on low-dose Lasix.  But just 2 weeks ago started gaining more weight about 20 pounds.    Assessment/Plan:   Acute on chronic diastolic heart failure: Likely due to noncompliance with her dietary restrictions. Showed an EF of 60% with grade 1 diastolic heart failure.  Her recent cardiac cath showed no change compared to the previous 1.  There was hyperdynamic left ventricular. She will need strict I's and O's and daily weights standing. Agree with IV diuretics, monitor electrolytes and replete as needed. Restrict her fluids to 2000 cc.  Estimated dry weight last time she was in the hospital was around 68-70 kg.  Now on admission she was 78 kg  Chronic kidney disease stage IIIb: Her creatinine appears to be at baseline renal ultrasound was done 3 days ago showed no obstructions.  Essential hypertension: Continue metoprolol, Imdur and IV diuretics.  Insulin-dependent diabetes mellitus type 2: At home she is on Amaryl, will continue this with sliding scale monitor CBGs before meals and at bedtime.  Hypothyroidism: Continue Synthroid.   DVT prophylaxis: lovenox Family Communication:none Status is: Inpatient  Remains inpatient appropriate because:Hemodynamically unstable  Dispo: The patient is from: Home              Anticipated d/c is to: Home              Patient currently is not medically stable to d/c.   Difficult to place patient No   Code Status:     Code Status  Orders  (From admission, onward)           Start     Ordered   06/08/21 1903  Full code  Continuous        06/08/21 1903           Code Status History     Date Active Date Inactive Code Status Order ID Comments User Context   12/30/2020 0312 01/01/2021 1747 Full Code [id number]  [name], MD ED   03/12/2020 1932 03/13/2020 2109 Full Code [id number]  [name], [place] Inpatient   11/22/2019 1448 11/24/2019 1803 Full Code [id number]  [name], MD Inpatient   09/14/2019 0950 09/19/2019 1610 Full Code [id number]  [name], MD Inpatient   04/25/2017 1826 04/26/2017 2030 Full Code [id number]  [name], MD ED         IV Access:   Peripheral IV   Procedures and diagnostic studies:   DG Chest 2 View  Result Date: 06/08/2021 CLINICAL DATA:  Bilateral lower extremity edema, short of breath, weight gain EXAM: CHEST - 2 VIEW COMPARISON:  12/29/2020 FINDINGS: Frontal and lateral views of the chest demonstrates stable postsurgical changes from CABG. The cardiac silhouette is unremarkable. There is central vascular congestion with diffuse interstitial prominence consistent with interstitial edema. No airspace disease, effusion, or pneumothorax. No acute bony abnormalities. IMPRESSION: 1. Mild volume overload and interstitial edema. Electronically Signed   By: [name] M.D.   On: 06/08/2021 17:06     Medical Consultants:   None.  Subjective:    Meghan Terry she relates her breathing is slightly better today.  Objective:    Vitals:   06/09/21 0200 06/09/21 0533 06/09/21 0600 06/09/21 0700  BP: (!) 124/55 109/60 (!) 110/59 (!) 110/98  Pulse: 79 74 72 75  Resp: 19   17  Temp:      TempSrc:      SpO2: 99% 100% 99% 100%   SpO2: 100 %  No intake or output data in the 24 hours ending 06/09/21 0749 There were no vitals filed for this visit.  Exam: General exam: In no acute distress. Respiratory system: Good air movement and clear to  auscultation. Cardiovascular system: S1 & S2 heard, RRR.  Positive hepatojugular reflex Gastrointestinal system: Abdomen is nondistended, soft and nontender.  Extremities: No pedal edema. Skin: No rashes, lesions or ulcers Psychiatry: Judgement and insight appear normal. Mood & affect appropriate.    Data Reviewed:    Labs: Basic Metabolic Panel: Recent Labs  Lab 06/08/21 1620 06/09/21 0530  NA 139 138  K 4.1 3.8  CL 108 108  CO2 25 25  GLUCOSE 131* 132*  BUN 24* 25*  CREATININE 1.95* 1.94*  CALCIUM 8.8* 8.5*   GFR CrCl cannot be calculated (Unknown ideal weight.). Liver Function Tests: No results for input(s): AST, ALT, ALKPHOS, BILITOT, PROT, ALBUMIN in the last 168 hours. No results for input(s): LIPASE, AMYLASE in the last 168 hours. No results for input(s): AMMONIA in the last 168 hours. Coagulation profile No results for input(s): INR, PROTIME in the last 168 hours. COVID-19 Labs  No results for input(s): DDIMER, FERRITIN, LDH, CRP in the last 72 hours.  Lab Results  Component Value Date   SARSCOV2NAA NEGATIVE 06/08/2021   SARSCOV2NAA NEGATIVE 12/30/2020   [place] NEGATIVE 03/12/2020   [place] NEGATIVE 11/20/2019    CBC: Recent Labs  Lab 06/08/21 1620  WBC 8.5  HGB 10.5*  HCT 33.4*  MCV 94.9  PLT 241   Cardiac Enzymes: No results for input(s): CKTOTAL, CKMB, CKMBINDEX, TROPONINI in the last 168 hours. BNP (last 3 results) No results for input(s): PROBNP in the last 8760 hours. CBG: Recent Labs  Lab 06/08/21 2219 06/09/21 0748  GLUCAP 173* 162*   D-Dimer: No results for input(s): DDIMER in the last 72 hours. Hgb A1c: Recent Labs    06/08/21 1908  HGBA1C 6.7*   Lipid Profile: No results for input(s): CHOL, HDL, LDLCALC, TRIG, CHOLHDL, LDLDIRECT in the last 72 hours. Thyroid function studies: Recent Labs    06/08/21 2245  TSH 4.261   Anemia work up: No results for input(s): VITAMINB12, FOLATE, FERRITIN, TIBC, IRON,  RETICCTPCT in the last 72 hours. Sepsis Labs: Recent Labs  Lab 06/08/21 1620  WBC 8.5   Microbiology Recent Results (from the past 240 hour(s))  Resp Panel by RT-PCR (Flu A&B, Covid) Nasopharyngeal Swab     Status: None   Collection Time: 06/08/21  6:19 PM   Specimen: Nasopharyngeal Swab; Nasopharyngeal(NP) swabs in vial transport medium  Result Value Ref Range Status   SARS Coronavirus 2 by RT PCR NEGATIVE NEGATIVE Final    Comment: (NOTE) SARS-CoV-2 target nucleic acids are NOT DETECTED.  The SARS-CoV-2 RNA is generally detectable in upper respiratory specimens during the acute phase of infection. The lowest concentration of SARS-CoV-2 viral copies this assay can detect is 138 copies/mL. A negative result does not preclude SARS-Cov-2 infection and should not be used as the sole basis for treatment or other patient management decisions. A negative  result may occur with  improper specimen collection/handling, submission of specimen other than nasopharyngeal swab, presence of viral mutation(s) within the areas targeted by this assay, and inadequate number of viral copies(<138 copies/mL). A negative result must be combined with clinical observations, patient history, and epidemiological information. The expected result is Negative.  Fact Sheet for Patients:  EntrepreneurPulse.com.au  Fact Sheet for Healthcare Providers:  IncredibleEmployment.be  This test is no t yet approved or cleared by the [place] FDA and  has been authorized for detection and/or diagnosis of SARS-CoV-2 by FDA under an Emergency Use Authorization (EUA). This EUA will remain  in effect (meaning this test can be used) for the duration of the COVID-19 declaration under Section 564(b)(1) of the Act, 21 U.S.C.section 360bbb-3(b)(1), unless the authorization is terminated  or revoked sooner.       Influenza A by PCR NEGATIVE NEGATIVE Final   Influenza B by PCR  NEGATIVE NEGATIVE Final    Comment: (NOTE) The Xpert Xpress SARS-CoV-2/FLU/RSV plus assay is intended as an aid in the diagnosis of influenza from Nasopharyngeal swab specimens and should not be used as a sole basis for treatment. Nasal washings and aspirates are unacceptable for Xpert Xpress SARS-CoV-2/FLU/RSV testing.  Fact Sheet for Patients: EntrepreneurPulse.com.au  Fact Sheet for Healthcare Providers: IncredibleEmployment.be  This test is not yet approved or cleared by the [place] FDA and has been authorized for detection and/or diagnosis of SARS-CoV-2 by FDA under an Emergency Use Authorization (EUA). This EUA will remain in effect (meaning this test can be used) for the duration of the COVID-19 declaration under Section 564(b)(1) of the Act, 21 U.S.C. section 360bbb-3(b)(1), unless the authorization is terminated or revoked.  Performed at [facility], [place] [address], [place], [place] [address]      Medications:    aspirin EC  81 mg Oral Daily   buPROPion  300 mg Oral Daily   clonazePAM  0.5 mg Oral QHS   clopidogrel  75 mg Oral Daily   furosemide  40 mg Intravenous BID   glimepiride  1 mg Oral Q breakfast   heparin  5,000 Units Subcutaneous Q8H   insulin aspart  0-9 Units Subcutaneous TID WC   isosorbide mononitrate  60 mg Oral Daily   levothyroxine  50 mcg Oral Q0600   metoprolol succinate  50 mg Oral QHS   potassium chloride  10 mEq Oral Daily   ranolazine  1,000 mg Oral BID   rOPINIRole  1 mg Oral TID   rOPINIRole  4 mg Oral QHS   rosuvastatin  40 mg Oral Daily   topiramate  100 mg Oral BID   traZODone  150 mg Oral QHS   Continuous Infusions:    LOS: 1 day   Charlynne Cousins  [facility]  06/09/2021, 7:49 AM

## 2021-06-09 NOTE — ED Notes (Signed)
Patient is back in bed on the monitor in the bed with call bell in reach

## 2021-06-09 NOTE — Plan of Care (Signed)

## 2021-06-10 ENCOUNTER — Encounter (HOSPITAL_COMMUNITY): Payer: Self-pay | Admitting: Internal Medicine

## 2021-06-10 LAB — GLUCOSE, CAPILLARY
Glucose-Capillary: 111 mg/dL — ABNORMAL HIGH (ref 70–99)
Glucose-Capillary: 195 mg/dL — ABNORMAL HIGH (ref 70–99)
Glucose-Capillary: 144 mg/dL — ABNORMAL HIGH (ref 70–99)
Glucose-Capillary: 151 mg/dL — ABNORMAL HIGH (ref 70–99)

## 2021-06-10 LAB — BASIC METABOLIC PANEL
Anion gap: 9 (ref 5–15)
BUN: 29 mg/dL — ABNORMAL HIGH (ref 8–23)
CO2: 27 mmol/L (ref 22–32)
Calcium: 8.8 mg/dL — ABNORMAL LOW (ref 8.9–10.3)
Chloride: 103 mmol/L (ref 98–111)
Creatinine, Ser: 2.16 mg/dL — ABNORMAL HIGH (ref 0.44–1.00)
GFR, Estimated: 25 mL/min — ABNORMAL LOW (ref >60)
Glucose, Bld: 86 mg/dL (ref 70–99)
Potassium: 3.9 mmol/L (ref 3.5–5.1)
Sodium: 139 mmol/L (ref 135–145)

## 2021-06-10 MED ORDER — RANOLAZINE ER 500 MG PO TB12
500.0000 mg | ORAL_TABLET | Freq: Two times a day (BID) | ORAL | Status: DC
  Administered 2021-06-10 – 2021-06-11 (×2): 500 mg via ORAL
  Filled 2021-06-10 (×2): qty 1

## 2021-06-10 NOTE — Progress Notes (Signed)
Heart Failure Nurse Navigator Progress Note  PCP: Garwin Brothers, MD PCP-Cardiologist: Reita Cliche., MD Admission Diagnosis: dHF Admitted from: home with spouse  Presentation:   Meghan Terry presented 9/11 with SOB. Noted to be 20# gained. Pt interactive in interview process. Pt states she eats processed food often including lunch meat, canned soups and canned vegetables. Educated on importance of medication regimen and dietary modifications. Pt states she quit smoking cigarettes after her heart surgery in 08/2019, but does vape 3-4x day with nicotine cartridges. Counseled on cessation. Pt lives with spouse who has MS and is caregiver for him. 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   ECHO/ LVEF: 60-65%  Clinical Course:  Past Medical History:  Diagnosis Date   Angina pectoris ([place]) 11/17/2019   Anxiety    Atypical chest pain 12/30/2020   CKD (chronic kidney disease) stage 3b, GFR 30-59 ml/min 12/03/2019   Coronary artery disease    Coronary artery disease involving native coronary artery of native heart with angina pectoris ([place]) 10/03/2019   Depression 04/25/2017   Diabetes mellitus due to underlying condition with unspecified complications ([place]) [id number]   Essential hypertension 09/05/2019   Ex-smoker 09/05/2019   Family history of coronary artery disease 09/05/2019   History of depression 04/15/2020   Hx of diabetes mellitus 04/15/2020   Hyperlipidemia with target LDL less than 70 11/24/2019   Hypertension    Hypothyroid    Migraine 04/25/2017   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   Thyroid disease      Social History   Socioeconomic History   Marital status: Married    Spouse name:  Meghan Terry   Number of children: 2   Years of education: Not on file   Highest education level: Associate degree: occupational, Hotel manager, or vocational program  Occupational History   Occupation: disabilty    Comment: EMT/CNA @ [employer]  Tobacco Use   Smoking status: Former    Packs/day: 1.00    Years: 47.00    Pack years: 47.00    Types: Cigarettes    Quit date: 09/15/2019    Years since quitting: 1.7   Smokeless tobacco: Never  Vaping Use   Vaping Use: Every day   Start date: 09/15/2019   Substances: Nicotine  Substance and Sexual Activity   Alcohol use: No   Drug use: No   Sexual activity: Not on file  Other Topics Concern   Not on file  Social History Narrative   Not on file   Social Determinants of Health   Financial Resource Strain: Low Risk    Difficulty of Paying Living Expenses: Not very hard  Food Insecurity: No Food Insecurity   Worried About Charity fundraiser in the Last Year: Never true   Ran Out of Food in the Last Year: Never true  Transportation Needs: No Transportation Needs   Lack of Transportation (Medical): No   Lack of Transportation (Non-Medical): No  Physical Activity: Not on file  Stress: Not on file  Social Connections: Not on file    High Risk Criteria for Readmission and/or Poor Patient Outcomes: Heart failure hospital admissions (last 6 months): 1  No Show  rate: 0% Difficult social situation: no Demonstrates medication adherence: yes Primary Language: English Literacy level: able to read/write and comprehend. Wears reading glasses.   Education Assessment and Provision:  Detailed education and instructions provided on heart failure disease management including the following:  Signs and symptoms of Heart Failure When to call the physician Importance of daily weights Low sodium diet Fluid restriction Medication management Anticipated future follow-up appointments  Patient education given on each of the above topics.   Patient acknowledges understanding via teach back method and acceptance of all instructions.  Education Materials:  "Living Better With Heart Failure" Booklet, HF zone tool, & Daily Weight Tracker Tool.  Patient has scale at home: yes Patient has pill box at home: yes   Barriers of Care:   -dietary indiscretion  Considerations/Referrals:   Referral made to Heart Failure Pharmacist Stewardship: no Referral made to Heart Failure CSW/NCM TOC: no Referral made to Heart & Vascular TOC clinic: yes, 9/22 @ 10AM  Items for Follow-up on DC/TOC: -dietary modifications -fluid modification -optimize   Pricilla Holm, MSN, RN Heart Failure Nurse Navigator [phone]

## 2021-06-10 NOTE — Progress Notes (Signed)
[facility] PROGRESS NOTE    Progress Note  [name]  [id number] DOB: 09-Jan-1961 DOA: 06/08/2021 PCP: Garwin Brothers, MD     Brief Narrative:   Meghan Terry is an 60 y.o. female past medical history significant for chronic diastolic heart failure, refractory essential hypertension, chronic kidney disease stage IIIb, CAD status post CABG and stenting, insulin-dependent diabetes mellitus, hypothyroidism later started getting lower extremity swelling about 3 weeks ago her cardiologist started her on low-dose Lasix.  But just 2 weeks ago started gaining more weight about 20 pounds.    Assessment/Plan:   Acute on chronic diastolic heart failure: Likely due to noncompliance with her dietary restrictions. I's and O's are poorly recorded, weight is slowly trending down on admission she was 80 kg today she is 79. Continue IV diuresis, strict I's and O's and daily weights. Check a basic metabolic panel in the morning. Lower extremity Doppler was negative for DVT. She is no longer on metolazone at home.  Chronic kidney disease stage IIIb: Creatinine appears to be at baseline continue IV diuresis.  Recheck a basic metabolic panel in the morning.  Essential hypertension: Continue metoprolol, Imdur and IV diuretics.  Insulin-dependent diabetes mellitus type 2: At home she is on Amaryl, her blood glucose seems to be relatively well controlled, her A1c was 6.7.  Hypothyroidism: Continue Synthroid.   DVT prophylaxis: lovenox Family Communication:none Status is: Inpatient  Remains inpatient appropriate because:Hemodynamically unstable  Dispo: The patient is from: Home              Anticipated d/c is to: Home              Patient currently is not medically stable to d/c.   Difficult to place patient No   Code Status:     Code Status Orders  (From admission, onward)           Start     Ordered   06/08/21 1903  Full code  Continuous        06/08/21 1903            Code Status History     Date Active Date Inactive Code Status Order ID Comments User Context   12/30/2020 0312 01/01/2021 1747 Full Code [id number]  [name], MD ED   03/12/2020 1932 03/13/2020 2109 Full Code [id number]  [name], [place] [facility]   11/22/2019 1448 11/24/2019 1803 Full Code [id number]  [name], MD Inpatient   09/14/2019 0950 09/19/2019 1610 Full Code [id number]  [name], MD Inpatient   04/25/2017 1826 04/26/2017 2030 Full Code [id number]  [name], MD ED         IV Access:   Peripheral IV   Procedures and diagnostic studies:   DG Chest 2 View  Result Date: 06/08/2021 CLINICAL DATA:  Bilateral lower extremity edema, short of breath, weight gain EXAM: CHEST - 2 VIEW COMPARISON:  12/29/2020 FINDINGS: Frontal and lateral views of the chest demonstrates stable postsurgical changes from CABG. The cardiac silhouette is unremarkable. There is central vascular congestion with diffuse interstitial prominence consistent with interstitial edema. No airspace disease, effusion, or pneumothorax. No acute bony abnormalities. IMPRESSION: 1. Mild volume overload and interstitial edema. Electronically Signed   By: [name] M.D.   On: 06/08/2021 17:06   VAS [place] LOWER EXTREMITY VENOUS (DVT)  Result Date: 06/09/2021  Lower Venous DVT Study Patient Name:  Meghan Terry  Date of Exam:   06/09/2021 Medical Rec #: [id number]  Accession #:    [id number] Date of Birth: 12-May-1961      Patient Gender: F Patient Age:   46 years Exam Location:  [facility] Procedure:      VAS [place] LOWER EXTREMITY VENOUS (DVT) Referring Phys: Wynetta Fines --------------------------------------------------------------------------------  Indications: Edema.  Risk Factors: Patient with HX of CKD & CHF on Lasix with recent increase in edema and weight gain. Limitations: Poor ultrasound/tissue interface. Comparison Study: No previous exams available Performing Technologist: Jody Hill  RVT, RDMS  Examination Guidelines: A complete evaluation includes B-mode imaging, spectral Doppler, color Doppler, and power Doppler as needed of all accessible portions of each vessel. Bilateral testing is considered an integral part of a complete examination. Limited examinations for reoccurring indications may be performed as noted. The reflux portion of the exam is performed with the patient in reverse Trendelenburg.  +---------+---------------+---------+-----------+----------+--------------+ RIGHT    CompressibilityPhasicitySpontaneityPropertiesThrombus Aging +---------+---------------+---------+-----------+----------+--------------+ CFV      Full           Yes      Yes                                 +---------+---------------+---------+-----------+----------+--------------+ SFJ      Full                                                        +---------+---------------+---------+-----------+----------+--------------+ FV Prox  Full           Yes      Yes                                 +---------+---------------+---------+-----------+----------+--------------+ FV Mid   Full           Yes      Yes                                 +---------+---------------+---------+-----------+----------+--------------+ FV DistalFull           Yes      Yes                                 +---------+---------------+---------+-----------+----------+--------------+ PFV      Full                                                        +---------+---------------+---------+-----------+----------+--------------+ POP      Full           Yes      Yes                                 +---------+---------------+---------+-----------+----------+--------------+ PTV      Full                                                        +---------+---------------+---------+-----------+----------+--------------+  PERO     Full                                                         +---------+---------------+---------+-----------+----------+--------------+   +---------+---------------+---------+-----------+----------+---------------+ LEFT     CompressibilityPhasicitySpontaneityPropertiesThrombus Aging  +---------+---------------+---------+-----------+----------+---------------+ CFV      Full           Yes      Yes                                  +---------+---------------+---------+-----------+----------+---------------+ SFJ      Full                                                         +---------+---------------+---------+-----------+----------+---------------+ FV Prox  Full           Yes      Yes                                  +---------+---------------+---------+-----------+----------+---------------+ FV Mid   Full           Yes      Yes                                  +---------+---------------+---------+-----------+----------+---------------+ FV DistalFull           Yes      Yes                                  +---------+---------------+---------+-----------+----------+---------------+ PFV      Full                                                         +---------+---------------+---------+-----------+----------+---------------+ POP      Full           Yes      Yes                                  +---------+---------------+---------+-----------+----------+---------------+ PTV                                                   patent by color +---------+---------------+---------+-----------+----------+---------------+ PERO     Full                                                         +---------+---------------+---------+-----------+----------+---------------+  Summary: BILATERAL: - No evidence of deep vein thrombosis seen in the lower extremities, bilaterally. - No evidence of superficial venous thrombosis in the lower extremities, bilaterally. - RIGHT: - No cystic structure found in the popliteal fossa.   LEFT: - A cystic structure is found in the popliteal fossa.  *See table(s) above for measurements and observations. Electronically signed by Servando Snare MD on 06/09/2021 at 11:06:24 PM.    Final      Medical Consultants:   None.   Subjective:    Meghan Terry relates her breathing is better today, relates her orthopnea has resolved.  Objective:    Vitals:   06/09/21 1708 06/09/21 1918 06/10/21 0016 06/10/21 0345  BP: (!) 142/69 (!) 109/57 119/61 (!) 104/54  Pulse: 83 85 77 74  Resp: '18 18 18 18  '$ Temp: 98 F (36.7 C) 97.8 F (36.6 C) 97.7 F (36.5 C) (!) 97.2 F (36.2 C)  TempSrc: Oral Oral Oral Oral  SpO2: 100% 99% 98% 99%  Weight: 80.1 kg   79 kg  Height: '5\' 3"'$  (1.6 m)      SpO2: 99 %  No intake or output data in the 24 hours ending 06/10/21 0752 Filed Weights   06/09/21 1708 06/10/21 0345  Weight: 80.1 kg 79 kg    Exam: General exam: In no acute distress. Respiratory system: Good air movement and clear to auscultation. Cardiovascular system: S1 & S2 heard, RRR. No JVD. Gastrointestinal system: Abdomen is nondistended, soft and nontender.  Extremities: No pedal edema. Skin: No rashes, lesions or ulcers Psychiatry: Judgement and insight appear normal. Mood & affect appropriate.   Data Reviewed:    Labs: Basic Metabolic Panel: Recent Labs  Lab 06/08/21 1620 06/09/21 0530 06/10/21 0327  NA 139 138 139  K 4.1 3.8 3.9  CL 108 108 103  CO2 '25 25 27  '$ GLUCOSE 131* 132* 86  BUN 24* 25* 29*  CREATININE 1.95* 1.94* 2.16*  CALCIUM 8.8* 8.5* 8.8*    GFR Estimated Creatinine Clearance: 26.9 mL/min (A) (by C-G formula based on SCr of 2.16 mg/dL (H)). Liver Function Tests: No results for input(s): AST, ALT, ALKPHOS, BILITOT, PROT, ALBUMIN in the last 168 hours. No results for input(s): LIPASE, AMYLASE in the last 168 hours. No results for input(s): AMMONIA in the last 168 hours. Coagulation profile No results for input(s): INR, PROTIME in the last 168  hours. COVID-19 Labs  No results for input(s): DDIMER, FERRITIN, LDH, CRP in the last 72 hours.  Lab Results  Component Value Date   SARSCOV2NAA NEGATIVE 06/08/2021   SARSCOV2NAA NEGATIVE 12/30/2020   [place] NEGATIVE 03/12/2020   [place] NEGATIVE 11/20/2019    CBC: Recent Labs  Lab 06/08/21 1620  WBC 8.5  HGB 10.5*  HCT 33.4*  MCV 94.9  PLT 241    Cardiac Enzymes: No results for input(s): CKTOTAL, CKMB, CKMBINDEX, TROPONINI in the last 168 hours. BNP (last 3 results) No results for input(s): PROBNP in the last 8760 hours. CBG: Recent Labs  Lab 06/09/21 0748 06/09/21 1126 06/09/21 1739 06/09/21 2048 06/10/21 0542  GLUCAP 162* 131* 95 179* 111*    D-Dimer: No results for input(s): DDIMER in the last 72 hours. Hgb A1c: Recent Labs    06/08/21 1908  HGBA1C 6.7*    Lipid Profile: No results for input(s): CHOL, HDL, LDLCALC, TRIG, CHOLHDL, LDLDIRECT in the last 72 hours. Thyroid function studies: Recent Labs    06/08/21 2245  TSH 4.261    Anemia work up: No  results for input(s): VITAMINB12, FOLATE, FERRITIN, TIBC, IRON, RETICCTPCT in the last 72 hours. Sepsis Labs: Recent Labs  Lab 06/08/21 1620  WBC 8.5    Microbiology Recent Results (from the past 240 hour(s))  Resp Panel by RT-PCR (Flu A&B, Covid) Nasopharyngeal Swab     Status: None   Collection Time: 06/08/21  6:19 PM   Specimen: Nasopharyngeal Swab; Nasopharyngeal(NP) swabs in vial transport medium  Result Value Ref Range Status   SARS Coronavirus 2 by RT PCR NEGATIVE NEGATIVE Final    Comment: (NOTE) SARS-CoV-2 target nucleic acids are NOT DETECTED.  The SARS-CoV-2 RNA is generally detectable in upper respiratory specimens during the acute phase of infection. The lowest concentration of SARS-CoV-2 viral copies this assay can detect is 138 copies/mL. A negative result does not preclude SARS-Cov-2 infection and should not be used as the sole basis for treatment or other patient  management decisions. A negative result may occur with  improper specimen collection/handling, submission of specimen other than nasopharyngeal swab, presence of viral mutation(s) within the areas targeted by this assay, and inadequate number of viral copies(<138 copies/mL). A negative result must be combined with clinical observations, patient history, and epidemiological information. The expected result is Negative.  Fact Sheet for Patients:  EntrepreneurPulse.com.au  Fact Sheet for Healthcare Providers:  IncredibleEmployment.be  This test is no t yet approved or cleared by the [place] FDA and  has been authorized for detection and/or diagnosis of SARS-CoV-2 by FDA under an Emergency Use Authorization (EUA). This EUA will remain  in effect (meaning this test can be used) for the duration of the COVID-19 declaration under Section 564(b)(1) of the Act, 21 U.S.C.section 360bbb-3(b)(1), unless the authorization is terminated  or revoked sooner.       Influenza A by PCR NEGATIVE NEGATIVE Final   Influenza B by PCR NEGATIVE NEGATIVE Final    Comment: (NOTE) The Xpert Xpress SARS-CoV-2/FLU/RSV plus assay is intended as an aid in the diagnosis of influenza from Nasopharyngeal swab specimens and should not be used as a sole basis for treatment. Nasal washings and aspirates are unacceptable for Xpert Xpress SARS-CoV-2/FLU/RSV testing.  Fact Sheet for Patients: EntrepreneurPulse.com.au  Fact Sheet for Healthcare Providers: IncredibleEmployment.be  This test is not yet approved or cleared by the [place] FDA and has been authorized for detection and/or diagnosis of SARS-CoV-2 by FDA under an Emergency Use Authorization (EUA). This EUA will remain in effect (meaning this test can be used) for the duration of the COVID-19 declaration under Section 564(b)(1) of the Act, 21 U.S.C. section 360bbb-3(b)(1),  unless the authorization is terminated or revoked.  Performed at [facility], [place] [address], [place], [place] [address]      Medications:    aspirin EC  81 mg Oral Daily   buPROPion  300 mg Oral Daily   clonazePAM  0.5 mg Oral QHS   clopidogrel  75 mg Oral Daily   furosemide  40 mg Intravenous BID   glimepiride  1 mg Oral Q breakfast   heparin  5,000 Units Subcutaneous Q8H   influenza vac split quadrivalent PF  0.5 mL Intramuscular Tomorrow-1000   insulin aspart  0-9 Units Subcutaneous TID WC   isosorbide mononitrate  60 mg Oral Daily   levothyroxine  50 mcg Oral Q0600   metoprolol succinate  50 mg Oral QHS   potassium chloride  10 mEq Oral Daily   ranolazine  1,000 mg Oral BID   rOPINIRole  1 mg Oral TID  rOPINIRole  4 mg Oral QHS   rosuvastatin  40 mg Oral Daily   topiramate  100 mg Oral BID   traZODone  150 mg Oral QHS   Continuous Infusions:    LOS: 2 days   Charlynne Cousins  [facility]  06/10/2021, 7:52 AM

## 2021-06-11 ENCOUNTER — Other Ambulatory Visit (HOSPITAL_BASED_OUTPATIENT_CLINIC_OR_DEPARTMENT_OTHER): Payer: Self-pay

## 2021-06-11 LAB — GLUCOSE, CAPILLARY: Glucose-Capillary: 138 mg/dL — ABNORMAL HIGH (ref 70–99)

## 2021-06-11 LAB — BASIC METABOLIC PANEL
Anion gap: 10 (ref 5–15)
BUN: 31 mg/dL — ABNORMAL HIGH (ref 8–23)
CO2: 25 mmol/L (ref 22–32)
Calcium: 8.5 mg/dL — ABNORMAL LOW (ref 8.9–10.3)
Chloride: 103 mmol/L (ref 98–111)
Creatinine, Ser: 2.21 mg/dL — ABNORMAL HIGH (ref 0.44–1.00)
GFR, Estimated: 25 mL/min — ABNORMAL LOW (ref >60)
Glucose, Bld: 97 mg/dL (ref 70–99)
Potassium: 3.7 mmol/L (ref 3.5–5.1)
Sodium: 138 mmol/L (ref 135–145)

## 2021-06-11 MED ORDER — FUROSEMIDE 40 MG PO TABS
40.0000 mg | ORAL_TABLET | Freq: Every day | ORAL | 6 refills | Status: DC
  Filled 2021-06-11: qty 30, 30d supply, fill #0

## 2021-06-11 NOTE — TOC Transition Note (Signed)
Transition of Care [facility]) - CM/SW Discharge Note   Patient Details  Name: PAHOLA KIENHOLZ MRN: [id number] Date of Birth: March 01, 1961  [facility]) CM/SW Contact:  Zenon Mayo, RN Phone Number: 06/11/2021, 10:09 AM   Clinical Narrative:    NCM spoke with patient, she states she has her transportation at dc, she does not need any DME. CHF Navigator screened patient.    Final next level of care: [place] Barriers to Discharge: No Barriers Identified   Patient Goals and CMS Choice Patient states their goals for this hospitalization and ongoing recovery are:: return home   Choice offered to / list presented to : NA  Discharge Placement                       Discharge Plan and Services                  DME Agency: NA       HH Arranged: NA          Social Determinants of Health (SDOH) Interventions Food Insecurity Interventions: Intervention Not Indicated Financial Strain Interventions: Intervention Not Indicated Housing Interventions: Intervention Not Indicated Transportation Interventions: Intervention Not Indicated   Readmission Risk Interventions No flowsheet data found.

## 2021-06-11 NOTE — Discharge Summary (Signed)
Physician Discharge Summary  Meghan Terry [id number] DOB: 1961-02-23 DOA: 06/08/2021  PCP: Garwin Brothers, MD  Admit date: 06/08/2021 Discharge date: 06/11/2021 30 Day Unplanned Readmission Risk Score    Flowsheet Row ED to Hosp-Admission (Current) from 06/08/2021 in [facility]  30 Day Unplanned Readmission Risk Score (%) 21.19 Filed at 06/11/2021 0801       This score is the patient's risk of an unplanned readmission within 30 days of being discharged (0 -100%). The score is based on dignosis, age, lab data, medications, orders, and past utilization.   Low:  0-14.9   Medium: 15-21.9   High: 22-29.9   Extreme: 30 and above          Admitted From: Home Disposition: Home  Recommendations for Outpatient Follow-up:  Follow up with PCP in 1-2 weeks Please obtain BMP/CBC in one week Follow-up with cardiology in 1 to 2 weeks Please follow up with your PCP on the following pending results: Unresulted Labs (From admission, onward)     Start     Ordered   06/09/21 [id number]  Basic metabolic panel  Daily,   R      06/08/21 [place]: None Equipment/Devices: None  Discharge Condition: Stable CODE STATUS: Full code Diet recommendation: Low-salt  Subjective: Seen and examined.  No complaints.  No shortness of breath.  No edema.  Ready to go home.  Brief/Interim Summary: Meghan Terry is an 60 y.o. female past medical history significant for chronic diastolic heart failure, refractory essential hypertension, chronic kidney disease stage IIIb, CAD status post CABG and stenting, insulin-dependent diabetes mellitus, hypothyroidism later started getting lower extremity swelling, and about 3 weeks ago her cardiologist started her on low-dose Lasix.  But just 2 weeks ago started gaining more weight about 20 pounds.  She was admitted to hospital service with acute on chronic diastolic congestive heart failure, started on IV diuresis.  Her CKD stage IIIb  remained stable.  Rest of the medical problems remained stable.  She has had significant improvement, she now has only trace pitting edema and she is not hypoxic and does not have any shortness of breath even with exertion and walking in the halls.  She is ready to go home.  Reportedly, she was prescribed Lasix 40 mg p.o. daily as needed, I have changed it to p.o. daily and have advised her to follow-up with her cardiologist within 1 to 2 weeks for further recommendations.  She is well aware that she is to check her weight daily and report to her PCP if she gains 2 pounds in 1 day or 5 pounds in 1 week.  Discharge Diagnoses:  Active Problems:   Acute diastolic CHF (congestive heart failure) (HCC)   CHF (congestive heart failure) ([place])    Discharge Instructions   Allergies as of 06/11/2021       Reactions   Carbidopa-levodopa Other (See Comments)   Developed tics while taking   Prednisone Other (See Comments)   Turns red all over   Venlafaxine Other (See Comments)   Developed tics while taking - reaction to Effexor        Medication List     TAKE these medications    acetaminophen 500 MG tablet Commonly known as: TYLENOL Take 500-1,000 mg by mouth daily as needed for pain or headache.   amLODipine 2.5 MG tablet Commonly known as: NORVASC Take 1 tablet (  2.5 mg total) by mouth daily.   aspirin EC 81 MG tablet Take 1 tablet (81 mg total) by mouth daily.   buPROPion 300 MG 24 hr tablet Commonly known as: WELLBUTRIN XL Take 1 tablet by mouth daily Start taking on: August 02, 2021 What changed:  how much to take when to take this   cholecalciferol 25 MCG (1000 UNIT) tablet Commonly known as: VITAMIN D Take 2 tablets by mouth every day.   clonazePAM 0.5 MG tablet Commonly known as: KLONOPIN Take 1 tablet by mouth daily at bedtime What changed:  how much to take how to take this when to take this   clopidogrel 75 MG tablet Commonly known as: PLAVIX TAKE 1 TABLET  BY MOUTH DAILY What changed:  how much to take how to take this when to take this   furosemide 40 MG tablet Commonly known as: LASIX Take 1 tablet (40 mg total) by mouth daily. What changed:  how much to take how to take this when to take this   Generlac 10 GM/15ML Soln Generic drug: lactulose (encephalopathy) Take 30 ML by mouth three times a week as needed for constipation What changed:  how much to take how to take this when to take this additional instructions   glimepiride 1 MG tablet Commonly known as: AMARYL take 1 tablet (1 mg) by mouth once daily for 30 days What changed:  how much to take how to take this when to take this   isosorbide mononitrate 60 MG 24 hr tablet Commonly known as: IMDUR Take 1 tablet (60 mg total) by mouth daily.   levothyroxine 75 MCG tablet Commonly known as: SYNTHROID Take 75 mcg by mouth See admin instructions. Take one tablet (75 mcg) by mouth Monday thru Friday before breakfast (take 50 mcg tablet on Saturday and Sunday) What changed: Another medication with the same name was changed. Make sure you understand how and when to take each.   levothyroxine 50 MCG tablet Commonly known as: SYNTHROID TAKE 1 TABLET BY MOUTH ONCE DAILY ON AN EMPTY STOMACH 30 MINUTES BEFORE BREAKFAST ON SAT AND SUNDAY What changed:  how much to take how to take this when to take this additional instructions   metolazone 2.5 MG tablet Commonly known as: ZAROXOLYN Take 1 tablet (2.5 mg total) by mouth daily.   metoprolol succinate 50 MG 24 hr tablet Commonly known as: TOPROL-XL Take 1 tablet (50 mg total) by mouth at bedtime. Take with or immediately following a meal.   nitroGLYCERIN 0.4 MG SL tablet Commonly known as: NITROSTAT Place 1 tablet (0.4 mg total) under the tongue every 5 (five) minutes as needed for chest pain.   ondansetron 4 MG tablet Commonly known as: ZOFRAN take 1 tablet (4 mg) by mouth 2 times per day as needed for nausea    potassium chloride 10 MEQ tablet Commonly known as: KLOR-CON Take 1 tablet (10 mEq total) by mouth daily.   promethazine 25 MG tablet Commonly known as: PHENERGAN take 1 tablet (25 mg) by oral route every 6 hours as needed for 30 days What changed:  how much to take how to take this when to take this reasons to take this   ranolazine 1000 MG SR tablet Commonly known as: RANEXA Take 1 tablet (1,000 mg total) by mouth 2 (two) times daily.   rOPINIRole 4 MG tablet Commonly known as: REQUIP Take 4 mg by mouth at bedtime. Also take 1 mg tablet morning, afternoon and evening What changed:  Another medication with the same name was changed. Make sure you understand how and when to take each.   rOPINIRole 1 MG tablet Commonly known as: REQUIP Take 1 tablet by mouth 3 times daily What changed:  how much to take how to take this when to take this additional instructions   rosuvastatin 40 MG tablet Commonly known as: CRESTOR Take 1 tablet (40 mg total) by mouth at bedtime.   topiramate 100 MG tablet Commonly known as: TOPAMAX Take 100 mg by mouth 2 (two) times daily.   traMADol 50 MG tablet Commonly known as: ULTRAM Take 50 mg by mouth at bedtime as needed (pain).   traZODone 150 MG tablet Commonly known as: DESYREL TAKE 1 TABLET BY MOUTH ONCE DAILY What changed:  how much to take how to take this when to take this        Follow-up Information     Garwin Brothers, MD Follow up in 1 week(s).   Specialty: Internal Medicine Contact information: [place] [address] [phone]         RevankarReita Cliche, MD .   Specialty: Cardiology Contact information: [address] [place] [address] [phone]                Allergies  Allergen Reactions   Carbidopa-Levodopa Other (See Comments)    Developed tics while taking    Prednisone Other (See Comments)    Turns red all over    Venlafaxine Other (See Comments)     Developed tics while taking - reaction to Effexor     Consultations: None   Procedures/Studies: DG Chest 2 View  Result Date: 06/08/2021 CLINICAL DATA:  Bilateral lower extremity edema, short of breath, weight gain EXAM: CHEST - 2 VIEW COMPARISON:  12/29/2020 FINDINGS: Frontal and lateral views of the chest demonstrates stable postsurgical changes from CABG. The cardiac silhouette is unremarkable. There is central vascular congestion with diffuse interstitial prominence consistent with interstitial edema. No airspace disease, effusion, or pneumothorax. No acute bony abnormalities. IMPRESSION: 1. Mild volume overload and interstitial edema. Electronically Signed   By: [name] M.D.   On: 06/08/2021 17:06   US RENAL  Result Date: 06/05/2021 CLINICAL DATA:  CKD EXAM: RENAL / URINARY TRACT ULTRASOUND COMPLETE COMPARISON:  01/23/2015 FINDINGS: Right Kidney: Renal measurements: 8.7 x 3.8 x 4.8 cm = volume: 82 mL. Echogenicity within normal limits. No mass or hydronephrosis visualized. Left Kidney: Renal measurements: 9.0 x 4.1 x 4.4 cm = volume: 84 mL. Echogenicity within normal limits. No mass or hydronephrosis visualized. 1.3 cm simple cyst seen in the lower pole. Bladder: Appears normal for degree of bladder distention. Other: Diffuse increased echogenicity of the visualized portions of the hepatic parenchyma are a nonspecific indicator of hepatocellular dysfunction, most commonly steatosis. IMPRESSION: No significant sonographic abnormality of the kidneys. Electronically Signed   By: Miachel Roux M.D.   On: 06/05/2021 15:54   VAS [place] LOWER EXTREMITY VENOUS (DVT)  Result Date: 06/09/2021  Lower Venous DVT Study Patient Name:  BRYTNEE FOREST  Date of Exam:   06/09/2021 Medical Rec #: [id number]     Accession #:    [id number] Date of Birth: [birth year]      Patient Gender: F Patient Age:   80 years Exam Location:  [facility] Procedure:      VAS [place] LOWER EXTREMITY VENOUS (DVT) Referring Phys: Wynetta Fines --------------------------------------------------------------------------------  Indications: Edema.  Risk Factors: Patient with HX  of CKD & CHF on Lasix with recent increase in edema and weight gain. Limitations: Poor ultrasound/tissue interface. Comparison Study: No previous exams available Performing Technologist: Jody Hill RVT, RDMS  Examination Guidelines: A complete evaluation includes B-mode imaging, spectral Doppler, color Doppler, and power Doppler as needed of all accessible portions of each vessel. Bilateral testing is considered an integral part of a complete examination. Limited examinations for reoccurring indications may be performed as noted. The reflux portion of the exam is performed with the patient in reverse Trendelenburg.  +---------+---------------+---------+-----------+----------+--------------+ RIGHT    CompressibilityPhasicitySpontaneityPropertiesThrombus Aging +---------+---------------+---------+-----------+----------+--------------+ CFV      Full           Yes      Yes                                 +---------+---------------+---------+-----------+----------+--------------+ SFJ      Full                                                        +---------+---------------+---------+-----------+----------+--------------+ FV Prox  Full           Yes      Yes                                 +---------+---------------+---------+-----------+----------+--------------+ FV Mid   Full           Yes      Yes                                 +---------+---------------+---------+-----------+----------+--------------+ FV DistalFull           Yes      Yes                                 +---------+---------------+---------+-----------+----------+--------------+ PFV      Full                                                        +---------+---------------+---------+-----------+----------+--------------+ POP      Full           Yes      Yes                                  +---------+---------------+---------+-----------+----------+--------------+ PTV      Full                                                        +---------+---------------+---------+-----------+----------+--------------+ PERO     Full                                                        +---------+---------------+---------+-----------+----------+--------------+   +---------+---------------+---------+-----------+----------+---------------+  LEFT     CompressibilityPhasicitySpontaneityPropertiesThrombus Aging  +---------+---------------+---------+-----------+----------+---------------+ CFV      Full           Yes      Yes                                  +---------+---------------+---------+-----------+----------+---------------+ SFJ      Full                                                         +---------+---------------+---------+-----------+----------+---------------+ FV Prox  Full           Yes      Yes                                  +---------+---------------+---------+-----------+----------+---------------+ FV Mid   Full           Yes      Yes                                  +---------+---------------+---------+-----------+----------+---------------+ FV DistalFull           Yes      Yes                                  +---------+---------------+---------+-----------+----------+---------------+ PFV      Full                                                         +---------+---------------+---------+-----------+----------+---------------+ POP      Full           Yes      Yes                                  +---------+---------------+---------+-----------+----------+---------------+ PTV                                                   patent by color +---------+---------------+---------+-----------+----------+---------------+ PERO     Full                                                          +---------+---------------+---------+-----------+----------+---------------+     Summary: BILATERAL: - No evidence of deep vein thrombosis seen in the lower extremities, bilaterally. - No evidence of superficial venous thrombosis in the lower extremities, bilaterally. - RIGHT: - No cystic structure found in the popliteal fossa.  LEFT: - A cystic structure is found in the popliteal fossa.  *See table(s) above for measurements and observations. Electronically signed  by Servando Snare MD on 06/09/2021 at 11:06:24 PM.    Final      Discharge Exam: Vitals:   06/11/21 0441 06/11/21 0858  BP: 118/64 (!) 103/51  Pulse: 81   Resp: 17   Temp: (!) 97.5 F (36.4 C)   SpO2: 100%    Vitals:   06/10/21 1137 06/10/21 2052 06/11/21 0441 06/11/21 0858  BP: 111/64 (!) 114/53 118/64 (!) 103/51  Pulse: 85 87 81   Resp: '18 18 17   '$ Temp: 97.8 F (36.6 C) 97.8 F (36.6 C) (!) 97.5 F (36.4 C)   TempSrc: Oral Oral Oral   SpO2: 100% 99% 100%   Weight:   79 kg   Height:        General: Pt is alert, awake, not in acute distress Cardiovascular: RRR, S1/S2 +, no rubs, no gallops Respiratory: CTA bilaterally, no wheezing, no rhonchi Abdominal: Soft, NT, ND, bowel sounds + Extremities: Trace pitting edema bilateral lower extremity, no cyanosis    The results of significant diagnostics from this hospitalization (including imaging, microbiology, ancillary and laboratory) are listed below for reference.     Microbiology: Recent Results (from the past 240 hour(s))  Resp Panel by RT-PCR (Flu A&B, Covid) Nasopharyngeal Swab     Status: None   Collection Time: 06/08/21  6:19 PM   Specimen: Nasopharyngeal Swab; Nasopharyngeal(NP) swabs in vial transport medium  Result Value Ref Range Status   SARS Coronavirus 2 by RT PCR NEGATIVE NEGATIVE Final    Comment: (NOTE) SARS-CoV-2 target nucleic acids are NOT DETECTED.  The SARS-CoV-2 RNA is generally detectable in upper respiratory specimens during the acute phase  of infection. The lowest concentration of SARS-CoV-2 viral copies this assay can detect is 138 copies/mL. A negative result does not preclude SARS-Cov-2 infection and should not be used as the sole basis for treatment or other patient management decisions. A negative result may occur with  improper specimen collection/handling, submission of specimen other than nasopharyngeal swab, presence of viral mutation(s) within the areas targeted by this assay, and inadequate number of viral copies(<138 copies/mL). A negative result must be combined with clinical observations, patient history, and epidemiological information. The expected result is Negative.  Fact Sheet for Patients:  EntrepreneurPulse.com.au  Fact Sheet for Healthcare Providers:  IncredibleEmployment.be  This test is no t yet approved or cleared by the [place] FDA and  has been authorized for detection and/or diagnosis of SARS-CoV-2 by FDA under an Emergency Use Authorization (EUA). This EUA will remain  in effect (meaning this test can be used) for the duration of the COVID-19 declaration under Section 564(b)(1) of the Act, 21 U.S.C.section 360bbb-3(b)(1), unless the authorization is terminated  or revoked sooner.       Influenza A by PCR NEGATIVE NEGATIVE Final   Influenza B by PCR NEGATIVE NEGATIVE Final    Comment: (NOTE) The Xpert Xpress SARS-CoV-2/FLU/RSV plus assay is intended as an aid in the diagnosis of influenza from Nasopharyngeal swab specimens and should not be used as a sole basis for treatment. Nasal washings and aspirates are unacceptable for Xpert Xpress SARS-CoV-2/FLU/RSV testing.  Fact Sheet for Patients: EntrepreneurPulse.com.au  Fact Sheet for Healthcare Providers: IncredibleEmployment.be  This test is not yet approved or cleared by the [place] FDA and has been authorized for detection and/or diagnosis of  SARS-CoV-2 by FDA under an Emergency Use Authorization (EUA). This EUA will remain in effect (meaning this test can be used) for the duration of the COVID-19 declaration under Section  564(b)(1) of the Act, 21 U.S.C. section 360bbb-3(b)(1), unless the authorization is terminated or revoked.  Performed at [facility], [place] [address] [place], [place] [address]      Labs: BNP (last 3 results) Recent Labs    06/08/21 1818  BNP [id number]   Basic Metabolic Panel: Recent Labs  Lab 06/08/21 1620 06/09/21 0530 06/10/21 0327 06/11/21 0153  NA 139 138 139 138  K 4.1 3.8 3.9 3.7  CL 108 108 103 103  CO2 '25 25 27 25  '$ GLUCOSE 131* 132* 86 97  BUN 24* 25* 29* 31*  CREATININE 1.95* 1.94* 2.16* 2.21*  CALCIUM 8.8* 8.5* 8.8* 8.5*   Liver Function Tests: No results for input(s): AST, ALT, ALKPHOS, BILITOT, PROT, ALBUMIN in the last 168 hours. No results for input(s): LIPASE, AMYLASE in the last 168 hours. No results for input(s): AMMONIA in the last 168 hours. CBC: Recent Labs  Lab 06/08/21 1620  WBC 8.5  HGB 10.5*  HCT 33.4*  MCV 94.9  PLT 241   Cardiac Enzymes: No results for input(s): CKTOTAL, CKMB, CKMBINDEX, TROPONINI in the last 168 hours. BNP: Invalid input(s): POCBNP CBG: Recent Labs  Lab 06/10/21 0542 06/10/21 1136 06/10/21 1619 06/10/21 2054 06/11/21 0545  GLUCAP 111* 195* 144* 151* 138*   D-Dimer No results for input(s): DDIMER in the last 72 hours. Hgb A1c Recent Labs    06/08/21 1908  HGBA1C 6.7*   Lipid Profile No results for input(s): CHOL, HDL, LDLCALC, TRIG, CHOLHDL, LDLDIRECT in the last 72 hours. Thyroid function studies Recent Labs    06/08/21 2245  TSH 4.261   Anemia work up No results for input(s): VITAMINB12, FOLATE, FERRITIN, TIBC, IRON, RETICCTPCT in the last 72 hours. Urinalysis    Component Value Date/Time   COLORURINE STRAW (A) 09/14/2019 0005   APPEARANCEUR CLEAR 09/14/2019 0005   LABSPEC 1.008 09/14/2019 0005    PHURINE 7.0 09/14/2019 0005   GLUCOSEU NEGATIVE 09/14/2019 0005   HGBUR NEGATIVE 09/14/2019 0005   BILIRUBINUR NEGATIVE 09/14/2019 0005   KETONESUR NEGATIVE 09/14/2019 0005   PROTEINUR NEGATIVE 09/14/2019 0005   NITRITE NEGATIVE 09/14/2019 0005   LEUKOCYTESUR TRACE (A) 09/14/2019 0005   Sepsis Labs Invalid input(s): PROCALCITONIN,  WBC,  LACTICIDVEN Microbiology Recent Results (from the past 240 hour(s))  Resp Panel by RT-PCR (Flu A&B, Covid) Nasopharyngeal Swab     Status: None   Collection Time: 06/08/21  6:19 PM   Specimen: Nasopharyngeal Swab; Nasopharyngeal(NP) swabs in vial transport medium  Result Value Ref Range Status   SARS Coronavirus 2 by RT PCR NEGATIVE NEGATIVE Final    Comment: (NOTE) SARS-CoV-2 target nucleic acids are NOT DETECTED.  The SARS-CoV-2 RNA is generally detectable in upper respiratory specimens during the acute phase of infection. The lowest concentration of SARS-CoV-2 viral copies this assay can detect is 138 copies/mL. A negative result does not preclude SARS-Cov-2 infection and should not be used as the sole basis for treatment or other patient management decisions. A negative result may occur with  improper specimen collection/handling, submission of specimen other than nasopharyngeal swab, presence of viral mutation(s) within the areas targeted by this assay, and inadequate number of viral copies(<138 copies/mL). A negative result must be combined with clinical observations, patient history, and epidemiological information. The expected result is Negative.  Fact Sheet for Patients:  EntrepreneurPulse.com.au  Fact Sheet for Healthcare Providers:  IncredibleEmployment.be  This test is no t yet approved or cleared by the [place] and  has been authorized  for detection and/or diagnosis of SARS-CoV-2 by FDA under an Emergency Use Authorization (EUA). This EUA will remain  in effect (meaning this test  can be used) for the duration of the COVID-19 declaration under Section 564(b)(1) of the Act, 21 U.S.C.section 360bbb-3(b)(1), unless the authorization is terminated  or revoked sooner.       Influenza A by PCR NEGATIVE NEGATIVE Final   Influenza B by PCR NEGATIVE NEGATIVE Final    Comment: (NOTE) The Xpert Xpress SARS-CoV-2/FLU/RSV plus assay is intended as an aid in the diagnosis of influenza from Nasopharyngeal swab specimens and should not be used as a sole basis for treatment. Nasal washings and aspirates are unacceptable for Xpert Xpress SARS-CoV-2/FLU/RSV testing.  Fact Sheet for Patients: EntrepreneurPulse.com.au  Fact Sheet for Healthcare Providers: IncredibleEmployment.be  This test is not yet approved or cleared by the [place] FDA and has been authorized for detection and/or diagnosis of SARS-CoV-2 by FDA under an Emergency Use Authorization (EUA). This EUA will remain in effect (meaning this test can be used) for the duration of the COVID-19 declaration under Section 564(b)(1) of the Act, 21 U.S.C. section 360bbb-3(b)(1), unless the authorization is terminated or revoked.  Performed at [facility], [place] [address] [place], [place] [address]      Time coordinating discharge: Over 30 minutes  SIGNED:   Darliss Cheney, MD  [facility] 06/11/2021, 9:48 AM  If 7PM-7AM, please contact night-coverage [email or web address]

## 2021-06-12 ENCOUNTER — Other Ambulatory Visit: Payer: Self-pay

## 2021-06-12 ENCOUNTER — Other Ambulatory Visit (HOSPITAL_BASED_OUTPATIENT_CLINIC_OR_DEPARTMENT_OTHER): Payer: Self-pay

## 2021-06-13 ENCOUNTER — Other Ambulatory Visit (HOSPITAL_BASED_OUTPATIENT_CLINIC_OR_DEPARTMENT_OTHER): Payer: Self-pay

## 2021-06-13 MED ORDER — LEVOTHYROXINE SODIUM 75 MCG PO TABS
ORAL_TABLET | ORAL | 3 refills | Status: DC
  Filled 2021-06-13: qty 64, 90d supply, fill #0
  Filled 2021-09-07: qty 64, 90d supply, fill #1
  Filled 2021-12-15: qty 64, 90d supply, fill #2
  Filled 2022-04-23 – 2022-04-25 (×2): qty 64, 90d supply, fill #3

## 2021-06-13 MED ORDER — ROPINIROLE HCL 4 MG PO TABS
ORAL_TABLET | ORAL | 3 refills | Status: DC
  Filled 2021-06-13: qty 90, 90d supply, fill #0

## 2021-06-17 ENCOUNTER — Other Ambulatory Visit (HOSPITAL_BASED_OUTPATIENT_CLINIC_OR_DEPARTMENT_OTHER): Payer: Self-pay

## 2021-06-18 ENCOUNTER — Telehealth (HOSPITAL_COMMUNITY): Payer: Self-pay

## 2021-06-18 NOTE — Telephone Encounter (Signed)
Call attempted to confirm HV TOC appt 9/22 @ 10AM. HIPPA appropriate VM left with callback number.   Pricilla Holm, MSN, RN Heart Failure Nurse Navigator [phone]

## 2021-06-18 NOTE — Progress Notes (Signed)
HEART & VASCULAR TRANSITION OF CARE CONSULT NOTE     Referring Physician: Dr Olevia Bowens  Primary Care: Dr Reesa Chew  Primary Cardiologist: Dr Lennox Pippins   HPI: Referred to clinic by Dr Estrella Myrtle  for heart failure consultation.   Ms Keylly is a 60 year old with history of chronic HFpEF, hypothyroidism,DMII, CAD, CABG, HTN, and depression.   Admitted in 12/2020 with chest pain. Had repeat [facility] with patent grafts and unchanged  coronary  disease. Continued with risk factor modification.   Admitted 06/08/21 with volume overload. CXR with mild pulmonary edema. Diuresed with IV lasix and transitioned to 40 mg po lasix daily and continue on metolazone daily. Had lower extremity edema. Lower extremity doppler was negative for DVT. Discharged on 06/11/21.  Presents with her daughter. Anxious about her multiple medications and taking care of her husband. Mild SOB with exertion. Denies /PND/Orthopnea. Occasionally having chest pain. Appetite ok. Tries to follow low salt diet and limit fluid intake.  No fever or chills. Weight at home  178-183 pounds. Taking all medications . She is disabled. Unable to afford cardiac rehab. Lives with her husband. Her husband is disabled and requires assisstance.   Cardiac Testing  Echo 12/2020 EF 60-65% RV normal   LHC 12/31/20: Overall, no significant change when compared to angiography performed in June 2021. Risk factor modification   Left heart catheterization 03/13/20: Ost RCA to Prox RCA lesion is 30% stenosed. Previously placed Prox RCA to Dist RCA stent (unknown type) is widely patent. Previously placed RPAV stent (unknown type) is widely patent. RPDA lesion is 100% stenosed. Ost Cx to Prox Cx lesion is 100% stenosed. 3rd Mrg lesion is 100% stenosed. Mid Cx to Dist Cx lesion is 100% stenosed. Origin to Prox Graft lesion is 75% stenosed. Mid LAD-1 lesion is 75% stenosed. Mid LAD-2 lesion is 95% stenosed.  Review of Systems: [y] = yes, '[ ]'$  = no   General: Weight  gain '[ ]'$ ; Weight loss '[ ]'$ ; Anorexia '[ ]'$ ; Fatigue [ Y]; Fever '[ ]'$ ; Chills '[ ]'$ ; Weakness '[ ]'$   Cardiac: Chest pain/pressure '[ ]'$ ; Resting SOB '[ ]'$ ; Exertional SOB [ Y]; Orthopnea '[ ]'$ ; Pedal Edema '[ ]'$ ; Palpitations '[ ]'$ ; Syncope '[ ]'$ ; Presyncope '[ ]'$ ; Paroxysmal nocturnal dyspnea'[ ]'$   Pulmonary: Cough '[ ]'$ ; Wheezing'[ ]'$ ; Hemoptysis'[ ]'$ ; Sputum '[ ]'$ ; Snoring '[ ]'$   GI: Vomiting'[ ]'$ ; Dysphagia'[ ]'$ ; Melena'[ ]'$ ; Hematochezia '[ ]'$ ; Heartburn'[ ]'$ ; Abdominal pain '[ ]'$ ; Constipation '[ ]'$ ; Diarrhea '[ ]'$ ; BRBPR '[ ]'$   GU: Hematuria'[ ]'$ ; Dysuria '[ ]'$ ; Nocturia'[ ]'$   Vascular: Pain in legs with walking '[ ]'$ ; Pain in feet with lying flat '[ ]'$ ; Non-healing sores '[ ]'$ ; Stroke '[ ]'$ ; TIA '[ ]'$ ; Slurred speech '[ ]'$ ;  Neuro: Headaches'[ ]'$ ; Vertigo'[ ]'$ ; Seizures'[ ]'$ ; Paresthesias'[ ]'$ ;Blurred vision '[ ]'$ ; Diplopia '[ ]'$ ; Vision changes '[ ]'$   Ortho/Skin: Arthritis '[ ]'$ ; Joint pain [ Y]; Muscle pain '[ ]'$ ; Joint swelling '[ ]'$ ; Back Pain '[ ]'$ ; Rash '[ ]'$   Psych: Depression'[ ]'$ ; Anxiety'[ ]'$   Heme: Bleeding problems '[ ]'$ ; Clotting disorders '[ ]'$ ; Anemia '[ ]'$   Endocrine: Diabetes [ Y]; Thyroid dysfunction[ Y]   Past Medical History:  Diagnosis Date   Angina pectoris ([place]) 11/17/2019   Anxiety    Atypical chest pain 12/30/2020   CKD (chronic kidney disease) stage 3b, GFR 30-59 ml/min 12/03/2019   Coronary artery disease    Coronary artery disease involving native coronary artery of native heart with angina pectoris ([place])  10/03/2019   Depression 04/25/2017   Diabetes mellitus due to underlying condition with unspecified complications ([place]) [id number]   Essential hypertension 09/05/2019   Ex-smoker 09/05/2019   Family history of coronary artery disease 09/05/2019   History of depression 04/15/2020   Hx of diabetes mellitus 04/15/2020   Hyperlipidemia with target LDL less than 70 11/24/2019   Hypertension    Hypothyroid    Migraine 04/25/2017   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   Presence of drug coated stent in right coronary artery 11/24/2019   Pyelonephritis 05/20/2016   Restless legs  syndrome 04/15/2020   Formatting of this note might be different from the original. 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     amLODipine (NORVASC) 2.5 MG tablet Take 1 tablet (2.5 mg total) by mouth daily. 30 tablet 2   aspirin EC 81 MG tablet Take 1 tablet (81 mg total) by mouth daily. 90 tablet 3   [START ON 08/02/2021] buPROPion (WELLBUTRIN XL) 300 MG 24 hr tablet Take 1 tablet by mouth daily 90 tablet 3   cholecalciferol (VITAMIN D) 25 MCG (1000 UNIT) tablet Take 2 tablets by mouth every day. 200 tablet 3   clonazePAM (KLONOPIN) 0.5 MG tablet Take 1 tablet by mouth daily at bedtime 30 tablet 0   clopidogrel (PLAVIX) 75 MG tablet TAKE 1 TABLET BY MOUTH DAILY 90 tablet 2   furosemide (LASIX) 40 MG tablet Take 1 tablet (40 mg total) by mouth daily. 30 tablet 6   glimepiride (AMARYL) 1 MG tablet take 1 tablet (1 mg) by mouth once daily for 30 days 30 tablet 3   isosorbide mononitrate (IMDUR) 60 MG 24 hr tablet Take 1 tablet (60 mg total) by mouth daily. 90 tablet 2   lactulose, encephalopathy, (CHRONULAC) 10 GM/15ML SOLN Take 30 ML by mouth three times a week as needed for constipation 360 mL 3   levothyroxine (SYNTHROID) 50 MCG tablet TAKE 1 TABLET BY MOUTH ONCE DAILY ON AN EMPTY STOMACH 30 MINUTES BEFORE BREAKFAST ON SAT AND SUNDAY (Patient taking differently: Take 50 mcg by mouth See admin instructions. Take one tablet (50 mcg) by mouth on Saturday and Sunday before breakfast (take 75 mcg tablet Monday thru Friday)) 24 tablet 2   levothyroxine (SYNTHROID) 75 MCG tablet TAKE 1 TABLET BY MOUTH ONCE DAILY MONDAY THRU FRIDAY 64 tablet 3   metolazone (ZAROXOLYN) 2.5 MG tablet Take 1 tablet (2.5 mg total) by mouth daily. 90 tablet 3   metoprolol succinate  (TOPROL-XL) 50 MG 24 hr tablet Take 1 tablet (50 mg total) by mouth at bedtime. Take with or immediately following a meal. 90 tablet 2   nitroGLYCERIN (NITROSTAT) 0.4 MG SL tablet Place 1 tablet (0.4 mg total) under the tongue every 5 (five) minutes as needed for chest pain. 25 tablet 2   ondansetron (ZOFRAN) 4 MG tablet take 1 tablet (4 mg) by mouth 2 times per day as needed for nausea 180 tablet 1   potassium chloride (KLOR-CON) 10 MEQ tablet Take 1 tablet (10 mEq total) by mouth daily. 30 tablet 3   promethazine (PHENERGAN) 25 MG tablet take 1 tablet (25 mg) by oral route every 6 hours as needed for 30 days 60 tablet 3  ranolazine (RANEXA) 1000 MG SR tablet Take 1 tablet (1,000 mg total) by mouth 2 (two) times daily. 180 tablet 0   rOPINIRole (REQUIP) 1 MG tablet Take 1 tablet by mouth 3 times daily (Patient taking differently: Take 1 mg by mouth See admin instructions. Take one tablet (1 mg) by mouth three times daily - morning, afternoon and evening (take 4 mg tablet at bedtime)) 270 tablet 2   rOPINIRole (REQUIP) 4 MG tablet Take 4 mg by mouth at bedtime. Also take 1 mg tablet morning, afternoon and evening     rosuvastatin (CRESTOR) 40 MG tablet Take 1 tablet (40 mg total) by mouth at bedtime. 90 tablet 3   topiramate (TOPAMAX) 100 MG tablet Take 100 mg by mouth 2 (two) times daily.     traMADol (ULTRAM) 50 MG tablet Take 50 mg by mouth at bedtime as needed (pain).     traZODone (DESYREL) 150 MG tablet TAKE 1 TABLET BY MOUTH ONCE DAILY 90 tablet 3   No current facility-administered medications for this encounter.    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[employer] + [employer]  Tobacco Use   Smoking status: Former    Packs/day: 1.00    Years: 47.00    Pack years: 47.00    Types: Cigarettes    Quit date: 09/15/2019    Years since quitting: 1.7   Smokeless tobacco: Never  Vaping Use   Vaping Use: Every day   Start date: 09/15/2019   Substances: Nicotine  Substance and Sexual Activity   Alcohol use: No   Drug use: No   Sexual activity: Not on file  Other Topics Concern   Not on file  Social History Narrative   Not on file   Social Determinants of Health   Financial Resource Strain: Low Risk    Difficulty of Paying Living Expenses: Not very hard  Food Insecurity: No Food Insecurity   Worried About Charity fundraiser in the Last Year: Never true   Ran Out of Food in the Last Year: Never true  Transportation Needs: No Transportation Needs   Lack of Transportation (Medical): No   Lack of Transportation (Non-Medical): No  Physical Activity: Not on file  Stress: Not on file  Social Connections: Not on file  Intimate Partner Violence: Not on file      Family History  Problem Relation Age of Onset   Asthma Mother    COPD Mother    Diabetes Mother    Macular degeneration Mother    Congestive Heart Failure Father     Vitals:   06/19/21 0942  BP: 130/78  Pulse: 84  SpO2: 100%  Weight: 79.5 kg (175 lb 3.2 oz)   Wt Readings from Last 3 Encounters:  06/19/21 79.5 kg (175 lb 3.2 oz)  06/11/21 79 kg (174 lb 1.6 oz)  04/01/21 78.1 kg (172 lb 3.2  oz)    Reds Clip 30%   PHYSICAL EXAM: General:   No respiratory difficulty HEENT: normal Neck: supple. no JVD. Carotids 2+ bilat; no bruits. No lymphadenopathy or thryomegaly appreciated. Cor: PMI nondisplaced. Regular rate & rhythm. No rubs, gallops or murmurs. Lungs:  clear Abdomen: soft, nontender, nondistended. No hepatosplenomegaly. No bruits or masses. Good bowel sounds. Extremities: no cyanosis, clubbing, rash, L>R 1-2+ edema Neuro: alert & oriented x 3, cranial nerves grossly intact. moves all 4 extremities w/o difficulty. Affect pleasant.   ASSESSMENT & PLAN: Chronic HFpEF  Echo EF 60-65% with normal RV -NYHA III. Volume status stable. Continue lasix 40 mg daily and can take extra 40 mg lasix for weight 185 or greater.  - Stop daily metolazone and change to as needed.  -Start farxiga 10 mg daily --> Pharmacy team started patients assistance.  - Discussed low salt food choices, limiting fluid intake, and daily weights.   2. CAD  H/O CABGx 3 . Had [facility] 12/2020 with unchanged multivessel disease when compared to [facility] in 2012.  Patent grafts, RCA stented and patient, LAD 50-70% ostial- mid LAD stenosis  -On statin,aspirin, and Toprol XL - start farxiga 10 mg daily - Unable to afford cardiac rehab.  - [facility] to [facility]P program at [facility]  3. HTN  Stable.    Check BMET  and BNP   Referred to HFSW for coping strategies. See note.   Referred to HFSW: Yes Refer to Pharmacy: Yes medication assistance.  Refer to Home Health:  No Refer to Advanced Heart Failure Clinic:  no  Refer to General Cardiology: Establised with Dr Lennox Pippins  See one more time in [facility] then back to Dr Lennox Pippins.   Juleen Sorrels NP-C  3:57 PM

## 2021-06-19 ENCOUNTER — Encounter (HOSPITAL_COMMUNITY): Payer: Self-pay

## 2021-06-19 ENCOUNTER — Other Ambulatory Visit (HOSPITAL_COMMUNITY): Payer: Self-pay

## 2021-06-19 ENCOUNTER — Other Ambulatory Visit (HOSPITAL_BASED_OUTPATIENT_CLINIC_OR_DEPARTMENT_OTHER): Payer: Self-pay

## 2021-06-19 ENCOUNTER — Other Ambulatory Visit: Payer: Self-pay

## 2021-06-19 ENCOUNTER — Ambulatory Visit (HOSPITAL_COMMUNITY)
Admission: RE | Admit: 2021-06-19 | Discharge: 2021-06-19 | Disposition: A | Discharge status: Home / Self Care | Payer: 59 | Source: Ambulatory Visit | Attending: Adult Health | Admitting: Adult Health

## 2021-06-19 VITALS — BP 130/78 | HR 84 | Wt 175.2 lb

## 2021-06-19 DIAGNOSIS — E039 Hypothyroidism, unspecified: Secondary | ICD-10-CM | POA: Diagnosis not present

## 2021-06-19 DIAGNOSIS — I110 Hypertensive heart disease with heart failure: Secondary | ICD-10-CM | POA: Insufficient documentation

## 2021-06-19 DIAGNOSIS — Z833 Family history of diabetes mellitus: Secondary | ICD-10-CM | POA: Insufficient documentation

## 2021-06-19 DIAGNOSIS — Z7984 Long term (current) use of oral hypoglycemic drugs: Secondary | ICD-10-CM | POA: Diagnosis not present

## 2021-06-19 DIAGNOSIS — F419 Anxiety disorder, unspecified: Secondary | ICD-10-CM

## 2021-06-19 DIAGNOSIS — I5032 Chronic diastolic (congestive) heart failure: Secondary | ICD-10-CM | POA: Diagnosis not present

## 2021-06-19 DIAGNOSIS — Z8659 Personal history of other mental and behavioral disorders: Secondary | ICD-10-CM

## 2021-06-19 DIAGNOSIS — Z79890 Hormone replacement therapy (postmenopausal): Secondary | ICD-10-CM | POA: Diagnosis not present

## 2021-06-19 DIAGNOSIS — Z955 Presence of coronary angioplasty implant and graft: Secondary | ICD-10-CM | POA: Insufficient documentation

## 2021-06-19 DIAGNOSIS — F32A Depression, unspecified: Secondary | ICD-10-CM | POA: Insufficient documentation

## 2021-06-19 DIAGNOSIS — Z951 Presence of aortocoronary bypass graft: Secondary | ICD-10-CM | POA: Insufficient documentation

## 2021-06-19 DIAGNOSIS — R600 Localized edema: Secondary | ICD-10-CM | POA: Diagnosis not present

## 2021-06-19 DIAGNOSIS — Z79899 Other long term (current) drug therapy: Secondary | ICD-10-CM | POA: Diagnosis not present

## 2021-06-19 DIAGNOSIS — Z888 Allergy status to other drugs, medicaments and biological substances status: Secondary | ICD-10-CM | POA: Diagnosis not present

## 2021-06-19 DIAGNOSIS — E119 Type 2 diabetes mellitus without complications: Secondary | ICD-10-CM | POA: Diagnosis not present

## 2021-06-19 DIAGNOSIS — Z7902 Long term (current) use of antithrombotics/antiplatelets: Secondary | ICD-10-CM | POA: Diagnosis not present

## 2021-06-19 DIAGNOSIS — Z7982 Long term (current) use of aspirin: Secondary | ICD-10-CM | POA: Diagnosis not present

## 2021-06-19 DIAGNOSIS — Z8249 Family history of ischemic heart disease and other diseases of the circulatory system: Secondary | ICD-10-CM | POA: Diagnosis not present

## 2021-06-19 DIAGNOSIS — F17290 Nicotine dependence, other tobacco product, uncomplicated: Secondary | ICD-10-CM | POA: Diagnosis not present

## 2021-06-19 DIAGNOSIS — I2510 Atherosclerotic heart disease of native coronary artery without angina pectoris: Secondary | ICD-10-CM | POA: Diagnosis not present

## 2021-06-19 LAB — BRAIN NATRIURETIC PEPTIDE: B Natriuretic Peptide: 27.4 pg/mL (ref 0.0–100.0)

## 2021-06-19 LAB — BASIC METABOLIC PANEL
Anion gap: 9 (ref 5–15)
BUN: 31 mg/dL — ABNORMAL HIGH (ref 8–23)
CO2: 26 mmol/L (ref 22–32)
Calcium: 9.2 mg/dL (ref 8.9–10.3)
Chloride: 102 mmol/L (ref 98–111)
Creatinine, Ser: 2.15 mg/dL — ABNORMAL HIGH (ref 0.44–1.00)
GFR, Estimated: 25 mL/min — ABNORMAL LOW (ref >60)
Glucose, Bld: 104 mg/dL — ABNORMAL HIGH (ref 70–99)
Potassium: 3.6 mmol/L (ref 3.5–5.1)
Sodium: 137 mmol/L (ref 135–145)

## 2021-06-19 MED ORDER — FUROSEMIDE 40 MG PO TABS: 40.0000 mg | ORAL_TABLET | Freq: Every day | ORAL | 6 refills | Status: DC

## 2021-06-19 MED ORDER — METOLAZONE 2.5 MG PO TABS: 2.5000 mg | ORAL_TABLET | Freq: Every day | ORAL | 3 refills | Status: DC | PRN

## 2021-06-19 MED ORDER — DAPAGLIFLOZIN PROPANEDIOL 10 MG PO TABS
10.0000 mg | ORAL_TABLET | Freq: Every day | ORAL | 0 refills | Status: DC
  Filled 2021-06-19: qty 30, 30d supply, fill #0

## 2021-06-19 NOTE — Progress Notes (Signed)
Heart and Vascular Care Navigation  06/19/2021  Meghan Terry 1960-10-11 [id number]  Reason for Referral: Patient seen in HF TOC.   Engaged with patient face to face for initial visit for Heart and Vascular Care Coordination.                                                                                                   Assessment:   Patient is a 60 yo female who has been married for 34 years. She reports she has two adult children and her husband has 2 adult children. Patient receives [place] but not yet eligible for Medicare.Patient shared that she has never been privy  to her husbands finances. She states that she pays some bills and he pays the others. She reports that the marriage has been strained for years and that "he is mean". She shared that he was diagnosed with MS and has declined and she has become "nothing but his caregiver". Patient was hospitalized and the day she came home after open heart surgery he asked "what's for dinner?" She has considered multiple times to "leave the marriage and I think I am ready now". Patient shared multiple stressors and concerns that are affecting her health and she feels it is time to move out. Patients daughter very supportive and encouraging her to make the move during the visit.                          HRT/VAS Care Coordination     Home Assistive Devices/Equipment None   DME Agency NA       Social History:                                                                             SDOH Screenings   Alcohol Screen: Low Risk    Last Alcohol Screening Score (AUDIT): 0  Depression (PHQ2-9): Not on file  Financial Resource Strain: High Risk   Difficulty of Paying Living Expenses: Very hard  Food Insecurity: No Food Insecurity   Worried About Running Out of Food in the Last Year: Never true   Ran Out of Food in the Last Year: Never true  Housing: Low Risk    Last Housing Risk Score: 0  Physical Activity: Not on file   Social Connections: Not on file  Stress: Not on file  Tobacco Use: Medium Risk   Smoking Tobacco Use: Former   Smokeless Tobacco Use: Never  Transportation Needs: No Transportation Needs   Lack of Transportation (Medical): No   Lack of Transportation (Non-Medical): No    SDOH Interventions: Financial Resources:  Financial Strain Interventions: Other (Comment) Social Security for Licensed conveyancer Insecurity:  Food Insecurity Interventions: Intervention Not Indicated  Housing Insecurity:  Housing  Interventions: Intervention Not Indicated  Transportation:       Other Care Navigation Interventions:     Inpatient/Outpatient Substance Abuse Counseling/Rehab Options N/a  Provided Pharmacy assistance resources  N/a  Patient expressed Mental Health concerns Yes, Referred to:  [place]  Patient Referred to: N/a   Follow-up plan:  Patient plans to explore her options for separation from her husband and agreeable to counseling with Meghan Terry from [facility]. CSW continues to follow on next [facility] appointment. Meghan Terry, [place], [place]

## 2021-06-19 NOTE — Progress Notes (Signed)
ReDS Vest / Clip - 06/19/21 0900       ReDS Vest / Clip   Station Marker B    Ruler Value 33.5    ReDS Value Range Low volume    ReDS Actual Value 30

## 2021-06-19 NOTE — Patient Instructions (Addendum)
Take Metolazone only AS NEEDED  Continue Furosemide 40 mg Daily, take an extra tab if your weight is 185 lbs or greater  Start Farxiga 10 mg Daily  Your provider has prescribed Wilder Glade for you. Please be aware the most common side effect of this medication is urinary tract infections and yeast infections. Please practice good hygiene and keep this area clean and dry to help prevent this. If you do begin to have symptoms of these infections, such as difficulty urinating or painful urination,  please let us know.  Labs done today, your results will be available in MyChart, we will contact you for abnormal readings.  You have been referred to the PREP Program at the [facility], they will call you to arrange this  Do the following things EVERYDAY: Weigh yourself in the morning before breakfast. Write it down and keep it in a log. Take your medicines as prescribed Eat low salt foods--Limit salt (sodium) to 2000 mg per day.  Stay as active as you can everyday Limit all fluids for the day to less than 2 liters  Thank you for allowing [place] to provider your heart failure care after your recent hospitalization. Please follow-up with:  [place] in 2 weeks, then [place] in [place] in 1-2 months, they will call you for this appointment   If you have any questions, issues, or concerns before your next appointment please call our office at [phone], opt. 2 and leave a message for the triage nurse.

## 2021-06-20 ENCOUNTER — Other Ambulatory Visit (HOSPITAL_BASED_OUTPATIENT_CLINIC_OR_DEPARTMENT_OTHER): Payer: Self-pay

## 2021-06-20 MED ORDER — TOPIRAMATE 100 MG PO TABS
100.0000 mg | ORAL_TABLET | Freq: Two times a day (BID) | ORAL | 3 refills | Status: DC
  Filled 2021-06-20: qty 180, 90d supply, fill #0
  Filled 2021-09-25: qty 180, 90d supply, fill #1
  Filled 2021-12-15: qty 180, 90d supply, fill #2
  Filled 2022-04-23: qty 180, 90d supply, fill #3

## 2021-06-23 ENCOUNTER — Other Ambulatory Visit (HOSPITAL_BASED_OUTPATIENT_CLINIC_OR_DEPARTMENT_OTHER): Payer: Self-pay

## 2021-06-24 ENCOUNTER — Telehealth: Payer: Self-pay

## 2021-06-24 ENCOUNTER — Other Ambulatory Visit (HOSPITAL_BASED_OUTPATIENT_CLINIC_OR_DEPARTMENT_OTHER): Payer: Self-pay

## 2021-06-24 NOTE — Telephone Encounter (Signed)
Pt returned call Explained PREP program to pt, wants to participate in class  Lives in [place] mind driving to [place] however spends time in [place] Can do T/TH in [place] 2pm-315p starting Oct 25th.  Will call her back to complete intake closer to start date

## 2021-06-24 NOTE — Telephone Encounter (Signed)
LVMT pt requesting call back to discuss PREP

## 2021-06-26 ENCOUNTER — Other Ambulatory Visit (HOSPITAL_BASED_OUTPATIENT_CLINIC_OR_DEPARTMENT_OTHER): Payer: Self-pay

## 2021-06-27 ENCOUNTER — Other Ambulatory Visit (HOSPITAL_BASED_OUTPATIENT_CLINIC_OR_DEPARTMENT_OTHER): Payer: Self-pay

## 2021-07-03 ENCOUNTER — Ambulatory Visit (HOSPITAL_COMMUNITY)
Admission: RE | Admit: 2021-07-03 | Discharge: 2021-07-03 | Disposition: A | Discharge status: Home / Self Care | Payer: 59 | Source: Ambulatory Visit | Attending: Internal Medicine | Admitting: Internal Medicine

## 2021-07-03 ENCOUNTER — Other Ambulatory Visit (HOSPITAL_BASED_OUTPATIENT_CLINIC_OR_DEPARTMENT_OTHER): Payer: Self-pay

## 2021-07-03 ENCOUNTER — Other Ambulatory Visit (HOSPITAL_COMMUNITY): Payer: Self-pay

## 2021-07-03 ENCOUNTER — Other Ambulatory Visit: Payer: Self-pay

## 2021-07-03 VITALS — BP 160/90 | HR 87 | Wt 173.8 lb

## 2021-07-03 DIAGNOSIS — I25119 Atherosclerotic heart disease of native coronary artery with unspecified angina pectoris: Secondary | ICD-10-CM

## 2021-07-03 DIAGNOSIS — Z7982 Long term (current) use of aspirin: Secondary | ICD-10-CM | POA: Insufficient documentation

## 2021-07-03 DIAGNOSIS — Z833 Family history of diabetes mellitus: Secondary | ICD-10-CM | POA: Diagnosis not present

## 2021-07-03 DIAGNOSIS — Z955 Presence of coronary angioplasty implant and graft: Secondary | ICD-10-CM | POA: Insufficient documentation

## 2021-07-03 DIAGNOSIS — Z951 Presence of aortocoronary bypass graft: Secondary | ICD-10-CM | POA: Insufficient documentation

## 2021-07-03 DIAGNOSIS — I5032 Chronic diastolic (congestive) heart failure: Secondary | ICD-10-CM | POA: Insufficient documentation

## 2021-07-03 DIAGNOSIS — Z8249 Family history of ischemic heart disease and other diseases of the circulatory system: Secondary | ICD-10-CM | POA: Diagnosis not present

## 2021-07-03 DIAGNOSIS — Z87891 Personal history of nicotine dependence: Secondary | ICD-10-CM | POA: Insufficient documentation

## 2021-07-03 DIAGNOSIS — Z7984 Long term (current) use of oral hypoglycemic drugs: Secondary | ICD-10-CM | POA: Diagnosis not present

## 2021-07-03 DIAGNOSIS — I2510 Atherosclerotic heart disease of native coronary artery without angina pectoris: Secondary | ICD-10-CM | POA: Diagnosis not present

## 2021-07-03 DIAGNOSIS — E039 Hypothyroidism, unspecified: Secondary | ICD-10-CM | POA: Insufficient documentation

## 2021-07-03 DIAGNOSIS — I130 Hypertensive heart and chronic kidney disease with heart failure and stage 1 through stage 4 chronic kidney disease, or unspecified chronic kidney disease: Secondary | ICD-10-CM | POA: Diagnosis not present

## 2021-07-03 DIAGNOSIS — I50812 Chronic right heart failure: Secondary | ICD-10-CM

## 2021-07-03 DIAGNOSIS — E1122 Type 2 diabetes mellitus with diabetic chronic kidney disease: Secondary | ICD-10-CM | POA: Diagnosis not present

## 2021-07-03 DIAGNOSIS — Z79899 Other long term (current) drug therapy: Secondary | ICD-10-CM | POA: Insufficient documentation

## 2021-07-03 DIAGNOSIS — N1832 Chronic kidney disease, stage 3b: Secondary | ICD-10-CM | POA: Diagnosis not present

## 2021-07-03 LAB — BASIC METABOLIC PANEL
Anion gap: 9 (ref 5–15)
BUN: 33 mg/dL — ABNORMAL HIGH (ref 8–23)
CO2: 25 mmol/L (ref 22–32)
Calcium: 9.1 mg/dL (ref 8.9–10.3)
Chloride: 102 mmol/L (ref 98–111)
Creatinine, Ser: 2.35 mg/dL — ABNORMAL HIGH (ref 0.44–1.00)
GFR, Estimated: 23 mL/min — ABNORMAL LOW (ref >60)
Glucose, Bld: 103 mg/dL — ABNORMAL HIGH (ref 70–99)
Potassium: 3.3 mmol/L — ABNORMAL LOW (ref 3.5–5.1)
Sodium: 136 mmol/L (ref 135–145)

## 2021-07-03 LAB — BRAIN NATRIURETIC PEPTIDE: B Natriuretic Peptide: 36.8 pg/mL (ref 0.0–100.0)

## 2021-07-03 MED ORDER — CLONAZEPAM 0.5 MG PO TABS
0.5000 mg | ORAL_TABLET | Freq: Every day | ORAL | 1 refills | Status: DC
  Filled 2021-07-03: qty 30, 30d supply, fill #0
  Filled 2021-07-30: qty 30, 30d supply, fill #1

## 2021-07-03 MED ORDER — FUROSEMIDE 40 MG PO TABS
40.0000 mg | ORAL_TABLET | Freq: Every day | ORAL | 6 refills | Status: DC | PRN
  Filled 2021-07-03: qty 30, 30d supply, fill #0
  Filled 2021-07-31: qty 30, 30d supply, fill #1
  Filled 2021-09-07: qty 30, 30d supply, fill #2
  Filled 2021-11-02: qty 30, 30d supply, fill #3
  Filled 2021-11-19: qty 30, 30d supply, fill #4
  Filled 2021-12-15: qty 30, 30d supply, fill #5

## 2021-07-03 NOTE — Progress Notes (Signed)
CSW met with patient to follow up on previous visit. Patient reports she and her husband are doing much better. She states that "it's like a switch was turned recently". Patient went on to share that her daughter is having some marital issues and that her 61 yo grandson has moved in with her due to the issues in her daughters home. CSW provided information on [facility] [employer] to assist her daughter in discussing her options for assistance. CSW also gave patient some gift cards from [employer] to assist with gas needs related to the care of her grandson. Patient was grateful for the support and assistance during the clinic visit. CSW available as needed. Meghan Terry, [place], [place]

## 2021-07-03 NOTE — Progress Notes (Signed)
[facility]  PCP: Garwin Brothers, MD  Primary Cardiologist: Jenean Lindau, MD   HPI:  Meghan Terry is a 60 y.o.  female  with a PMH significant for chronic HFpEF, hypothyroidism,DMII, CAD, CABG, HTN, and depression.    Admitted in 12/2020 with chest pain. Had repeat [facility] with patent grafts and unchanged  coronary  disease. Continued with risk factor modification.    Admitted 06/08/21 with volume overload. CXR with mild pulmonary edema. Diuresed with IV lasix and transitioned to 40 mg po lasix daily and continue on metolazone daily. Had lower extremity edema. Lower extremity doppler was negative for DVT. Discharged on 06/11/21.   Presents with her daughter. Anxious about her multiple medications and taking care of her husband. Mild SOB with exertion. Denies /PND/Orthopnea. Occasionally having chest pain. Appetite ok. Tries to follow low salt diet and limit fluid intake.  No fever or chills. Weight at home  178-183 pounds. Taking all medications . She is disabled. Unable to afford cardiac rehab. Lives with her husband. Her husband is disabled and requires assisstance.   Out of diuretics since Saturday.  Weight has been stable the last several days.  Did get as low as 169lbs. she brought a log around 172-174lbs by home scale.  Feeling better since last visit here.  Improvement in chest tightness and shortness of breath since last visit.  Denies orthopnea.  Still having trouble with going up inclines. Does okay on one set of stairs 14 steps.  About a month ago she couldn't go up stairs at all.  Having some domestic issues at home she is visibly upset today.  Has had some dizziness and light headedness.     Cardiac Testing  Echo 12/2020 EF 60-65% RV normal    LHC 12/31/20: Overall, no significant change when compared to angiography performed in June 2021. Risk factor modification    Left heart catheterization 03/13/20: Ost RCA to Prox RCA lesion is 30%  stenosed. Previously placed Prox RCA to Dist RCA stent (unknown type) is widely patent. Previously placed RPAV stent (unknown type) is widely patent. RPDA lesion is 100% stenosed. Ost Cx to Prox Cx lesion is 100% stenosed. 3rd Mrg lesion is 100% stenosed. Mid Cx to Dist Cx lesion is 100% stenosed. Origin to Prox Graft lesion is 75% stenosed. Mid LAD-1 lesion is 75% stenosed. Mid LAD-2 lesion is 95% stenosed.  ROS: All systems negative except as listed in HPI, PMH and Problem List.  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  Tobacco Use   Smoking status: Former    Packs/day: 1.00    Years: 47.00    Pack years: 47.00    Types: Cigarettes    Quit date: 09/15/2019    Years since quitting: 1.8   Smokeless tobacco: Never  Vaping Use   Vaping Use: Every day   Start date: 09/15/2019   Substances: Nicotine  Substance and Sexual Activity   Alcohol use: No   Drug use: No   Sexual activity: Not on file  Other Topics Concern   Not on file  Social History Narrative   Not on file   Social Determinants  of Health   Financial Resource Strain: High Risk   Difficulty of Paying Living Expenses: Very hard  Food Insecurity: No Food Insecurity   Worried About Running Out of Food in the Last Year: Never true   Ran Out of Food in the Last Year: Never true  Transportation Needs: No Transportation Needs   Lack of Transportation (Medical): No   Lack of Transportation (Non-Medical): No  Physical Activity: Not on file  Stress: Not on file  Social Connections: Not on file  Intimate Partner Violence: Not on file    FH:  Family History  Problem Relation Age of Onset   Asthma Mother    COPD Mother     Diabetes Mother    Macular degeneration Mother    Congestive Heart Failure Father     Past Medical History:  Diagnosis Date   Angina pectoris ([place]) 11/17/2019   Anxiety    Atypical chest pain 12/30/2020   CKD (chronic kidney disease) stage 3b, GFR 30-59 ml/min 12/03/2019   Coronary artery disease    Coronary artery disease involving native coronary artery of native heart with angina pectoris ([place]) 10/03/2019   Depression 04/25/2017   Diabetes mellitus due to underlying condition with unspecified complications ([place]) 99991111   Essential hypertension 09/05/2019   Ex-smoker 09/05/2019   Family history of coronary artery disease 09/05/2019   History of depression 04/15/2020   Hx of diabetes mellitus 04/15/2020   Hyperlipidemia with target LDL less than 70 11/24/2019   Hypertension    Hypothyroid    Migraine 04/25/2017   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     amLODipine (NORVASC) 2.5 MG tablet Take 1 tablet (2.5 mg total) by mouth daily. 30 tablet 2   aspirin EC 81 MG tablet Take 1 tablet (81 mg total) by mouth daily. 90 tablet 3   [START ON 08/02/2021] buPROPion (WELLBUTRIN XL) 300 MG 24 hr tablet Take 1 tablet by mouth daily 90 tablet 3   cholecalciferol (VITAMIN D) 25 MCG (1000 UNIT) tablet Take 2 tablets by mouth every day. 200 tablet 3   clonazePAM (KLONOPIN) 0.5 MG tablet Take 1 tablet by mouth daily at bedtime 30 tablet 0   clonazePAM (KLONOPIN) 0.5 MG tablet Take 1 tablet (0.5 mg total) by mouth at bedtime. 30 tablet 1    clopidogrel (PLAVIX) 75 MG tablet TAKE 1 TABLET BY MOUTH DAILY 90 tablet 2   dapagliflozin propanediol (FARXIGA) 10 MG TABS tablet Take 1 tablet (10 mg total) by mouth daily before breakfast. 30 tablet 0   glimepiride (AMARYL) 1 MG tablet take 1 tablet (1 mg) by mouth once daily for 30 days 30 tablet 3   isosorbide mononitrate (IMDUR) 60 MG 24 hr tablet Take 1 tablet (60 mg total) by mouth daily. 90 tablet 2   lactulose, encephalopathy, (CHRONULAC) 10 GM/15ML SOLN Take 30 ML by mouth three times a week as needed for constipation 360 mL 3   levothyroxine (SYNTHROID) 50 MCG tablet TAKE 1 TABLET BY MOUTH  ONCE DAILY ON AN EMPTY STOMACH 30 MINUTES BEFORE BREAKFAST ON SAT AND SUNDAY (Patient taking differently: Take 50 mcg by mouth See admin instructions. Take one tablet (50 mcg) by mouth on Saturday and Sunday before breakfast (take 75 mcg tablet Monday thru Friday)) 24 tablet 2   levothyroxine (SYNTHROID) 75 MCG tablet TAKE 1 TABLET BY MOUTH ONCE DAILY MONDAY THRU FRIDAY 64 tablet 3   metolazone (ZAROXOLYN) 2.5 MG tablet Take 1 tablet (2.5 mg total) by mouth daily as needed (only for weight gain and swelling). 90 tablet 3   metoprolol succinate (TOPROL-XL) 50 MG 24 hr tablet Take 1 tablet (50 mg total) by mouth at bedtime. Take with or immediately following a meal. 90 tablet 2   nitroGLYCERIN (NITROSTAT) 0.4 MG SL tablet Place 1 tablet (0.4 mg total) under the tongue every 5 (five) minutes as needed for chest pain. 25 tablet 2   ondansetron (ZOFRAN) 4 MG tablet take 1 tablet (4 mg) by mouth 2 times per day as needed for nausea 180 tablet 1   potassium chloride (KLOR-CON) 10 MEQ tablet Take 1 tablet (10 mEq total) by mouth daily. 30 tablet 3   promethazine (PHENERGAN) 25 MG tablet take 1 tablet (25 mg) by oral route every 6 hours as needed for 30 days 60 tablet 3   ranolazine (RANEXA) 1000 MG SR tablet Take 1 tablet (1,000 mg total) by mouth 2 (two) times daily. 180 tablet 0   rOPINIRole (REQUIP) 1 MG  tablet Take 1 tablet by mouth 3 times daily (Patient taking differently: Take 1 mg by mouth See admin instructions. Take one tablet (1 mg) by mouth three times daily - morning, afternoon and evening (take 4 mg tablet at bedtime)) 270 tablet 2   rosuvastatin (CRESTOR) 40 MG tablet Take 1 tablet (40 mg total) by mouth at bedtime. 90 tablet 3   topiramate (TOPAMAX) 100 MG tablet TAKE 1 TABLET BY MOUTH TWICE DAILY. 180 tablet 3   traMADol (ULTRAM) 50 MG tablet Take 50 mg by mouth at bedtime as needed (pain).     traZODone (DESYREL) 150 MG tablet TAKE 1 TABLET BY MOUTH ONCE DAILY 90 tablet 3   furosemide (LASIX) 40 MG tablet Take 1 tablet (40 mg total) by mouth daily as needed for fluid or edema. Take extra tab if weight is 185 lbs or greater 30 tablet 6   No current facility-administered medications for this encounter.    Vitals:   07/03/21 1053  BP: (!) 160/90  Pulse: 87  SpO2: 100%  Weight: 78.8 kg (173 lb 12.8 oz)    PHYSICAL EXAM: Cardiac: JVD flat, normal rate and rhythm, clear s1 and s2, no murmurs, rubs or gallops, compression stockings bilaterally Pulmonary: CTAB, not in distress Abdominal: non distended abdomen, soft and nontender Psych: Alert, conversant, in good spirits    ASSESSMENT & PLAN: Chronic HFpEF  -Echo 05/2021 EF 60-65% with normal RV -Last visit metolazone switched to PRN, lasix '40mg'$  continued -NYHA Class II-III improved symptoms, weight down 2 addtl lbs since last visit -Will adjust lasix to PRN starting with '40mg'$  every other day. Will refill her lasix today.  She will continue to watch her volume status closely and will adjust as needed.  We discussed she may need every other day vs 1-2  times per week.  Hopefully light headedness will continue to improve -continue toprol XL 50 daily, farxiga 10, amlodipine 2.'5mg'$  -she will begin to keep a log of her bp at home, already has a  cuff will not change antihypertensive regimen given she is upset and only have one value  available.     CAD -H/O CABGx 3 . Had [facility] 12/2020 with unchanged multivessel disease when compared to [facility] in 2012.  Patent grafts, RCA stented and patient, LAD 50-70% ostial- mid LAD stenosis -Continue DAPT, statin, GDMT above   Seen by social work in follow up today   Follow up w/ Dr. Geraldo Pitter

## 2021-07-03 NOTE — Patient Instructions (Addendum)
Labs done today. We will contact you only if your labs are abnormal.  Take lasix '40mg'$  (1 tablet) by mouth today.  DECREASE Lasix to '40mg'$  (1 tablet) by mouth only as needed.   No other medication changes were made. Please continue all current medications as prescribed.

## 2021-07-07 ENCOUNTER — Other Ambulatory Visit (HOSPITAL_BASED_OUTPATIENT_CLINIC_OR_DEPARTMENT_OTHER): Payer: Self-pay

## 2021-07-08 ENCOUNTER — Ambulatory Visit: Payer: Self-pay | Admitting: Psychologist

## 2021-07-08 ENCOUNTER — Ambulatory Visit: Payer: 59 | Admitting: Psychologist

## 2021-07-09 ENCOUNTER — Other Ambulatory Visit (HOSPITAL_BASED_OUTPATIENT_CLINIC_OR_DEPARTMENT_OTHER): Payer: Self-pay

## 2021-07-10 ENCOUNTER — Other Ambulatory Visit (HOSPITAL_BASED_OUTPATIENT_CLINIC_OR_DEPARTMENT_OTHER): Payer: Self-pay

## 2021-07-10 ENCOUNTER — Telehealth: Payer: Self-pay

## 2021-07-10 NOTE — Telephone Encounter (Signed)
VMT pt reference PREP class starting at [facility] on 07/22/21 Requesting call back to schedule intake for pt

## 2021-07-11 ENCOUNTER — Other Ambulatory Visit (HOSPITAL_BASED_OUTPATIENT_CLINIC_OR_DEPARTMENT_OTHER): Payer: Self-pay

## 2021-07-14 ENCOUNTER — Telehealth: Payer: Self-pay

## 2021-07-14 NOTE — Telephone Encounter (Signed)
Recalled pt today to check interest in PREP Able to connect today Class starts [place] on 10/25 T/TH 2p-315pm. Confirmed interest and availability Initial assessment scheduled for 10/20 at 3pm at [facility]

## 2021-07-17 ENCOUNTER — Other Ambulatory Visit (HOSPITAL_BASED_OUTPATIENT_CLINIC_OR_DEPARTMENT_OTHER): Payer: Self-pay

## 2021-07-17 NOTE — Progress Notes (Signed)
[facility] PREP Evaluation  Patient Details  Name: Meghan Terry MRN: [id number] Date of Birth: 1960-12-17 Age: 60 y.o. PCP: Garwin Brothers, MD  Vitals:   07/17/21 1200  BP: 128/68  Pulse: 93  Weight: 184 lb 6.4 oz (83.6 kg)     [facility] Eval - 07/17/21 1600       Referral    Referring Provider CHF clinic    Reason for referral Diabetes;Heart Failure;Hypertension;Inactivity    Program Start Date 07/22/21   T/TH 2pm-315pm x 12 wks     Measurement   Waist Circumference 44.5 inches    Hip Circumference 46.5 inches    Body fat 40.1 percent      Information for Trainer   Goals Lose wt (50 lbs goal), have more energy, improve sleep    Current Exercise caregiver for husband    Orthopedic Concerns none    Pertinent Medical History CABG x 3, stents (3), CKD4, CHF, DM, HTN    Medications that affect exercise Medication causing dizziness/drowsiness;Beta blocker      Timed Up and Go (TUGS)   Timed Up and Go Moderate risk 10-12 seconds      Mobility and Daily Activities   I find it easy to walk up or down two or more flights of stairs. 1    I have no trouble taking out the trash. 4    I do housework such as vacuuming and dusting on my own without difficulty. 4    I can easily lift a gallon of milk (8lbs). 4    I can easily walk a mile. 2    I have no trouble reaching into high cupboards or reaching down to pick up something from the floor. 4    I do not have trouble doing out-door work such as Armed forces logistics/support/administrative officer, raking leaves, or gardening. 4      Mobility and Daily Activities   I feel younger than my age. 3    I feel independent. 4    I feel energetic. 1    I live an active life.  2    I feel strong. 1    I feel healthy. 2    I feel active as other people my age. 2      How fit and strong are you.   Fit and Strong Total Score 38            Past Medical History:  Diagnosis Date   Angina pectoris ([place]) 11/17/2019   Anxiety    Atypical chest pain 12/30/2020   CKD (chronic kidney  disease) stage 3b, GFR 30-59 ml/min 12/03/2019   Coronary artery disease    Coronary artery disease involving native coronary artery of native heart with angina pectoris ([place]) 10/03/2019   Depression 04/25/2017   Diabetes mellitus due to underlying condition with unspecified complications ([place]) 99991111   Essential hypertension 09/05/2019   Ex-smoker 09/05/2019   Family history of coronary artery disease 09/05/2019   History of depression 04/15/2020   Hx of diabetes mellitus 04/15/2020   Hyperlipidemia with target LDL less than 70 11/24/2019   Hypertension    Hypothyroid    Migraine 04/25/2017   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 30 years  Controlled with requip   RLS (restless legs syndrome)    S/P CABG x  3 09/18/2019   Thyroid disease    Past Surgical History:  Procedure Laterality Date   ABDOMINAL HYSTERECTOMY     APPENDECTOMY     CARDIAC CATHETERIZATION  11/22/2019   CHOLECYSTECTOMY     COLONOSCOPY     CORONARY ARTERY BYPASS GRAFT N/A 09/15/2019   Procedure: CORONARY ARTERY BYPASS GRAFTING (CABG) times three on pump using left internal mammary artery and right and left greater saphenous veins harvested endoscopically;  Surgeon: Gaye Pollack, MD;  Location: [place];  Service: Open Heart Surgery;  Laterality: N/A;   CORONARY STENT INTERVENTION N/A 11/23/2019   Procedure: CORONARY STENT INTERVENTION;  Surgeon: [name], MD;  Location: [place] [facility];  Service: Cardiovascular;  Laterality: N/A;   KNEE ARTHROSCOPY     LEFT HEART CATH AND CORONARY ANGIOGRAPHY N/A 09/14/2019   Procedure: LEFT HEART CATH AND CORONARY ANGIOGRAPHY;  Surgeon: [name], MD;  Location: [place] [facility];  Service: Cardiovascular;  Laterality: N/A;   LEFT HEART CATH AND CORS/GRAFTS ANGIOGRAPHY N/A 11/22/2019    Procedure: LEFT HEART CATH AND CORS/GRAFTS ANGIOGRAPHY;  Surgeon: [name], MD;  Location: [place] [facility];  Service: Cardiovascular;  Laterality: N/A;   LEFT HEART CATH AND CORS/GRAFTS ANGIOGRAPHY N/A 03/13/2020   Procedure: LEFT HEART CATH AND CORS/GRAFTS ANGIOGRAPHY;  Surgeon: Lorretta Harp, MD;  Location: [place] [facility];  Service: Cardiovascular;  Laterality: N/A;   LEFT HEART CATH AND CORS/GRAFTS ANGIOGRAPHY N/A 12/31/2020   Procedure: LEFT HEART CATH AND CORS/GRAFTS ANGIOGRAPHY;  Surgeon: Belva Crome, MD;  Location: [place] [facility];  Service: Cardiovascular;  Laterality: N/A;   OSTEOCHONDRAL DEFECT REPAIR/RECONSTRUCTION Left 11/29/2014   Procedure: LEFT ANKLE MEDIAL MALLEOLUS OSTEOTOMY,AUTO GRAFT FROM CALCANEOUS;  OS TIBIA DENORO GRAFTING TALUS;  Surgeon: Wylene Simmer, MD;  Location: [place];  Service: Orthopedics;  Laterality: Left;   TEE WITHOUT CARDIOVERSION N/A 09/15/2019   Procedure: TRANSESOPHAGEAL ECHOCARDIOGRAM (TEE);  Surgeon: Gaye Pollack, MD;  Location: [place];  Service: Open Heart Surgery;  Laterality: N/A;   TUBAL LIGATION     Social History   Tobacco Use  Smoking Status Former   Packs/day: 1.00   Years: 47.00   Pack years: 47.00   Types: Cigarettes   Quit date: 09/15/2019   Years since quitting: 1.8  Smokeless Tobacco Never    Barnett Hatter 07/17/2021, 4:41 PM

## 2021-07-18 ENCOUNTER — Ambulatory Visit: Payer: 59

## 2021-07-21 ENCOUNTER — Other Ambulatory Visit (HOSPITAL_BASED_OUTPATIENT_CLINIC_OR_DEPARTMENT_OTHER): Payer: Self-pay

## 2021-07-21 ENCOUNTER — Telehealth: Payer: Self-pay

## 2021-07-21 MED ORDER — TRAMADOL HCL 50 MG PO TABS
ORAL_TABLET | ORAL | 3 refills | Status: DC
  Filled 2021-07-21: qty 14, 7d supply, fill #0
  Filled 2021-09-25: qty 14, 7d supply, fill #1
  Filled 2021-12-01: qty 14, 7d supply, fill #2
  Filled 2021-12-15: qty 14, 7d supply, fill #3
  Filled 2021-12-28: qty 14, 7d supply, fill #4

## 2021-07-21 MED ORDER — ROSUVASTATIN CALCIUM 40 MG PO TABS
ORAL_TABLET | ORAL | 3 refills | Status: DC
  Filled 2021-07-21: qty 90, 90d supply, fill #0
  Filled 2021-10-19: qty 90, 90d supply, fill #1
  Filled 2022-01-31: qty 90, 90d supply, fill #2
  Filled 2022-05-12: qty 90, 90d supply, fill #3

## 2021-07-21 MED ORDER — AMLODIPINE BESYLATE 2.5 MG PO TABS
ORAL_TABLET | ORAL | 11 refills | Status: DC
  Filled 2021-07-21: qty 30, 30d supply, fill #0
  Filled 2021-08-28: qty 30, 30d supply, fill #1

## 2021-07-21 MED ORDER — PEG 3350-KCL-NA BICARB-NACL 420 G PO SOLR
ORAL | 0 refills | Status: DC
  Filled 2021-07-21: qty 4000, 1d supply, fill #0

## 2021-07-21 MED ORDER — METOLAZONE 2.5 MG PO TABS
ORAL_TABLET | ORAL | 3 refills | Status: DC
  Filled 2021-07-21: qty 25, 25d supply, fill #0
  Filled 2021-08-25: qty 25, 25d supply, fill #1
  Filled 2021-09-25: qty 25, 25d supply, fill #2
  Filled 2021-10-19: qty 25, 25d supply, fill #3

## 2021-07-21 MED ORDER — LEVOTHYROXINE SODIUM 50 MCG PO TABS
ORAL_TABLET | ORAL | 3 refills | Status: DC
  Filled 2021-11-19: qty 24, 90d supply, fill #0
  Filled 2022-02-16: qty 24, 90d supply, fill #1

## 2021-07-21 MED ORDER — NITROGLYCERIN 0.4 MG SL SUBL
SUBLINGUAL_TABLET | SUBLINGUAL | 11 refills | Status: DC
  Filled 2021-07-21: qty 25, 7d supply, fill #0

## 2021-07-21 MED ORDER — PROMETHAZINE HCL 25 MG PO TABS
ORAL_TABLET | ORAL | 11 refills | Status: DC
  Filled 2021-07-21: qty 60, 15d supply, fill #0
  Filled 2021-08-28: qty 60, 15d supply, fill #1
  Filled 2021-09-25: qty 60, 15d supply, fill #2
  Filled 2021-11-02: qty 60, 15d supply, fill #3
  Filled 2021-11-19: qty 60, 15d supply, fill #4
  Filled 2022-01-06: qty 60, 15d supply, fill #5
  Filled 2022-05-12: qty 60, 15d supply, fill #6
  Filled 2022-05-23: qty 60, 15d supply, fill #7
  Filled 2022-06-16: qty 60, 15d supply, fill #8
  Filled 2022-06-28: qty 60, 15d supply, fill #9
  Filled 2022-07-14: qty 60, 15d supply, fill #10

## 2021-07-21 MED ORDER — POTASSIUM CHLORIDE ER 10 MEQ PO TBCR
EXTENDED_RELEASE_TABLET | ORAL | 3 refills | Status: DC
  Filled 2021-07-21: qty 90, 90d supply, fill #0

## 2021-07-21 NOTE — Telephone Encounter (Signed)
   [facility] Pre-operative Risk Assessment    Patient Name: Meghan Terry  DOB: 1961/09/15 MRN: [id number]  HEARTCARE STAFF:  - IMPORTANT!!!!!! Under Visit Info/Reason for Call, type in Other and utilize the format Clearance MM/DD/YY or Clearance TBD. Do not use dashes or single digits. - Please review there is not already an duplicate clearance open for this procedure. - If request is for dental extraction, please clarify the # of teeth to be extracted. - If the patient is currently at the dentist's office, call Pre-Op Callback Staff (MA/nurse) to input urgent request.  - If the patient is not currently in the dentist office, please route to the Pre-Op pool.  Request for surgical clearance:  What type of surgery is being performed? Colonoscopy  When is this surgery scheduled? 08/15/21  What type of clearance is required (medical clearance vs. Pharmacy clearance to hold med vs. Both)? Both  Are there any medications that need to be held prior to surgery and how long? Aspirin stop on 08/10/21 and Plavix stop on 08/10/21  Practice name and name of physician performing surgery? [facility]- Dr. Christia Reading Misenheimer  What is the office phone number? [phone]   7.   What is the office fax number? [phone]  8.   Anesthesia type (None, local, MAC, general) ?    Lowella Grip 07/21/2021, 4:28 PM  _________________________________________________________________   (provider comments below)

## 2021-07-22 ENCOUNTER — Ambulatory Visit: Payer: Self-pay | Admitting: Psychologist

## 2021-07-22 ENCOUNTER — Other Ambulatory Visit (HOSPITAL_BASED_OUTPATIENT_CLINIC_OR_DEPARTMENT_OTHER): Payer: Self-pay

## 2021-07-22 NOTE — Telephone Encounter (Addendum)
   Patient Name: Meghan Terry  DOB: 02/15/1961 MRN: [id number]  Primary Cardiologist: Jenean Lindau, MD  Chart reviewed as part of pre-operative protocol coverage. Patient was recently hospitalized in 05/2021 and had TOC follow-up with [facility] in September / October 2022. They were deemed to require a follow-up with primary cardiologist Dr. Geraldo Pitter which is scheduled 11/9/222. Fortunately this is several days before her colonoscopy or required medication hold is due to begin (requested 06/10/21), so clearance can be addressed at that visit. I added pre-op FYI to appt notes so that provider is aware.   Per office protocol, Dr. Geraldo Pitter should assess clearance at time of office visit and should forward their finalized clearance decision to requesting party below. I will route this message as FYI to requesting party and remove this message from the pre-op box as separate preop APP input not needed at this time.  Charlie Pitter, PA-C 07/22/2021, 10:20 AM

## 2021-07-24 NOTE — Progress Notes (Signed)
[facility] PREP Weekly Session  Patient Details  Name: Meghan Terry MRN: [id number] Date of Birth: 1961-04-27 Age: 60 y.o. PCP: Garwin Brothers, MD  There were no vitals filed for this visit.   [facility] Weekly seesion - 07/24/21 1600       [facility] "PREP" Location   [facility] "PREP" Location [facility]      Weekly Session   Topic Discussed Goal setting and welcome to the program   Scale of perceived exertion, fit testing done   Classes attended to date 2             Barnett Hatter 07/24/2021, 4:36 PM

## 2021-07-29 NOTE — Progress Notes (Signed)
[facility] PREP Weekly Session  Patient Details  Name: TALAYAH PICARDI MRN: [id number] Date of Birth: [birth year] Age: 60 y.o. PCP: Garwin Brothers, MD  Vitals:   07/29/21 1658  Weight: 173 lb 12.8 oz (78.8 kg)     [facility] Weekly seesion - 07/29/21 1600       [facility] "PREP" Location   [facility] "PREP" Location [facility]      Weekly Session   Topic Discussed Importance of resistance training;Other ways to be active    Minutes exercised this week 55 minutes    Classes attended to date [place] 07/29/2021, 4:59 PM

## 2021-07-30 ENCOUNTER — Other Ambulatory Visit (HOSPITAL_BASED_OUTPATIENT_CLINIC_OR_DEPARTMENT_OTHER): Payer: Self-pay

## 2021-07-31 ENCOUNTER — Other Ambulatory Visit: Payer: Self-pay | Admitting: Cardiology

## 2021-07-31 ENCOUNTER — Other Ambulatory Visit (HOSPITAL_BASED_OUTPATIENT_CLINIC_OR_DEPARTMENT_OTHER): Payer: Self-pay

## 2021-07-31 ENCOUNTER — Other Ambulatory Visit: Payer: Self-pay

## 2021-07-31 ENCOUNTER — Other Ambulatory Visit (HOSPITAL_COMMUNITY): Payer: Self-pay | Admitting: Cardiology

## 2021-07-31 MED ORDER — RANOLAZINE ER 1000 MG PO TB12
1000.0000 mg | ORAL_TABLET | Freq: Two times a day (BID) | ORAL | 0 refills | Status: DC
  Filled 2021-07-31: qty 180, 90d supply, fill #0
  Filled 2021-08-12: qty 60, 30d supply, fill #0

## 2021-07-31 MED ORDER — DAPAGLIFLOZIN PROPANEDIOL 10 MG PO TABS
10.0000 mg | ORAL_TABLET | Freq: Every day | ORAL | 0 refills | Status: DC
  Filled 2021-07-31: qty 30, 30d supply, fill #0

## 2021-08-01 ENCOUNTER — Other Ambulatory Visit (HOSPITAL_BASED_OUTPATIENT_CLINIC_OR_DEPARTMENT_OTHER): Payer: Self-pay

## 2021-08-04 ENCOUNTER — Other Ambulatory Visit (HOSPITAL_BASED_OUTPATIENT_CLINIC_OR_DEPARTMENT_OTHER): Payer: Self-pay

## 2021-08-05 NOTE — Progress Notes (Signed)
[facility] PREP Weekly Session  Patient Details  Name: Meghan Terry MRN: [id number] Date of Birth: 02-17-1961 Age: 60 y.o. PCP: Garwin Brothers, MD  Vitals:   08/05/21 1400  Weight: 172 lb 9.6 oz (78.3 kg)     [facility] Weekly seesion - 08/05/21 1600       [facility] "PREP" Location   [facility] "PREP" Location [facility]      Weekly Session   Topic Discussed Healthy eating tips    Minutes exercised this week 70 minutes    Classes attended to date [place] 08/05/2021, 4:41 PM

## 2021-08-06 ENCOUNTER — Ambulatory Visit (INDEPENDENT_AMBULATORY_CARE_PROVIDER_SITE_OTHER): Payer: 59 | Admitting: Cardiology

## 2021-08-06 ENCOUNTER — Encounter: Payer: Self-pay | Admitting: Cardiology

## 2021-08-06 ENCOUNTER — Other Ambulatory Visit: Payer: Self-pay

## 2021-08-06 VITALS — BP 118/58 | HR 85 | Ht 63.0 in | Wt 174.2 lb

## 2021-08-06 DIAGNOSIS — I10 Essential (primary) hypertension: Secondary | ICD-10-CM

## 2021-08-06 DIAGNOSIS — E088 Diabetes mellitus due to underlying condition with unspecified complications: Secondary | ICD-10-CM

## 2021-08-06 DIAGNOSIS — E785 Hyperlipidemia, unspecified: Secondary | ICD-10-CM

## 2021-08-06 DIAGNOSIS — I25119 Atherosclerotic heart disease of native coronary artery with unspecified angina pectoris: Secondary | ICD-10-CM

## 2021-08-06 DIAGNOSIS — Z951 Presence of aortocoronary bypass graft: Secondary | ICD-10-CM | POA: Diagnosis not present

## 2021-08-06 DIAGNOSIS — N183 Chronic kidney disease, stage 3 unspecified: Secondary | ICD-10-CM

## 2021-08-06 NOTE — Progress Notes (Signed)
Cardiology Office Note:    Date:  08/06/2021   ID:  Meghan Terry, DOB 1961/09/20, MRN [id number]  PCP:  Garwin Brothers, MD  Cardiologist:  Jenean Lindau, MD   Referring MD: Garwin Brothers, MD    ASSESSMENT:    1. Coronary artery disease involving native coronary artery of native heart with angina pectoris ([place])   2. Essential hypertension   3. Hyperlipidemia with target LDL less than 70   4. S/P CABG x 3   5. Stage 3 chronic kidney disease, unspecified whether stage 3a or 3b CKD ([place])   6. Diabetes mellitus due to underlying condition with unspecified complications ([place])    PLAN:    In order of problems listed above:  Coronary artery disease: Secondary prevention stressed with the patient.  Importance of compliance with diet medication stressed and she vocalized understanding.  She was advised to walk at least half an hour a day 5 days a week and she promises to do so. Essential hypertension: Blood pressure stable and diet was emphasized.  Lifestyle modification urged. Mixed dyslipidemia: Lipids were reviewed and diet was emphasized.  Lipids checked last time are fine. Diabetes mellitus and renal insufficiency: Significant.  She is monitored by nephrologist very closely.  I told her about diet and over-the-counter medications and to be cautious with the and she promises to do so. Patient will be seen in follow-up appointment in 6 months or earlier if the patient has any concerns    Medication Adjustments/Labs and Tests Ordered: Current medicines are reviewed at length with the patient today.  Concerns regarding medicines are outlined above.  No orders of the defined types were placed in this encounter.  No orders of the defined types were placed in this encounter.    No chief complaint on file.    History of Present Illness:    Meghan Terry is a 60 y.o. female.  Patient has past medical history of coronary artery disease, essential hypertension, dyslipidemia, diabetes mellitus  and advanced renal insufficiency.  She denies any problems at this time and takes care of activities of daily living.  No chest pain orthopnea or PND.  At the time of my evaluation, the patient is alert awake oriented and in no distress.  She has joined an exercise program and is meticulously following her protocol.  She gets chest pain at times probably 2 or 3 times a month which is relieved with nitroglycerin.  She has stable angina pectoris.  Past Medical History:  Diagnosis Date   Acute diastolic CHF (congestive heart failure) ([place]) 06/08/2021   Acute pulmonary edema ([facility])    Angina pectoris ([place]) 11/17/2019   Anxiety    Atypical chest pain 12/30/2020   CHF (congestive heart failure) ([place]) 06/08/2021   CKD (chronic kidney disease) stage 3b, GFR 30-59 ml/min 12/03/2019   Coronary artery disease    Coronary artery disease involving native coronary artery of native heart with angina pectoris ([place]) 10/03/2019   Depression 04/25/2017   Diabetes mellitus due to underlying condition with unspecified complications ([place]) 83/10/9189   Essential hypertension 09/05/2019   Ex-smoker 09/05/2019   Family history of coronary artery disease 09/05/2019   History of depression 04/15/2020   Hx of diabetes mellitus 04/15/2020   Hyperlipidemia with target LDL less than 70 11/24/2019   Hypertension    Hypothyroid    Migraine 04/25/2017   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   Thyroid disease     Past Surgical History:  Procedure Laterality Date   ABDOMINAL HYSTERECTOMY     APPENDECTOMY     CARDIAC CATHETERIZATION  11/22/2019   CHOLECYSTECTOMY     COLONOSCOPY     CORONARY ARTERY BYPASS GRAFT N/A 09/15/2019   Procedure: CORONARY ARTERY BYPASS  GRAFTING (CABG) times three on pump using left internal mammary artery and right and left greater saphenous veins harvested endoscopically;  Surgeon: Gaye Pollack, MD;  Location: [place];  Service: Open Heart Surgery;  Laterality: N/A;   CORONARY STENT INTERVENTION N/A 11/23/2019   Procedure: CORONARY STENT INTERVENTION;  Surgeon: [name], MD;  Location: [facility] [facility];  Service: Cardiovascular;  Laterality: N/A;   KNEE ARTHROSCOPY     LEFT HEART CATH AND CORONARY ANGIOGRAPHY N/A 09/14/2019   Procedure: LEFT HEART CATH AND CORONARY ANGIOGRAPHY;  Surgeon: [name], MD;  Location: [facility] [facility];  Service: Cardiovascular;  Laterality: N/A;   LEFT HEART CATH AND CORS/GRAFTS ANGIOGRAPHY N/A 11/22/2019   Procedure: LEFT HEART CATH AND CORS/GRAFTS ANGIOGRAPHY;  Surgeon: [name], MD;  Location: [place] [facility];  Service: Cardiovascular;  Laterality: N/A;   LEFT HEART CATH AND CORS/GRAFTS ANGIOGRAPHY N/A 03/13/2020   Procedure: LEFT HEART CATH AND CORS/GRAFTS ANGIOGRAPHY;  Surgeon: Lorretta Harp, MD;  Location: [place] [facility];  Service: Cardiovascular;  Laterality: N/A;   LEFT HEART CATH AND CORS/GRAFTS ANGIOGRAPHY N/A 12/31/2020   Procedure: LEFT HEART CATH AND CORS/GRAFTS ANGIOGRAPHY;  Surgeon: Belva Crome, MD;  Location: [place] [facility];  Service: Cardiovascular;  Laterality: N/A;   OSTEOCHONDRAL DEFECT REPAIR/RECONSTRUCTION Left 11/29/2014   Procedure: LEFT ANKLE MEDIAL MALLEOLUS OSTEOTOMY,AUTO GRAFT FROM CALCANEOUS;  OS TIBIA DENORO GRAFTING TALUS;  Surgeon: Wylene Simmer, MD;  Location: [place];  Service: Orthopedics;  Laterality: Left;   TEE WITHOUT CARDIOVERSION N/A 09/15/2019   Procedure: TRANSESOPHAGEAL ECHOCARDIOGRAM (TEE);  Surgeon: Gaye Pollack, MD;  Location: [place];  Service: Open Heart Surgery;  Laterality: N/A;   TUBAL LIGATION      Current Medications: Current Meds  Medication Sig   acetaminophen (TYLENOL) 500 MG tablet  Take 500-1,000 mg by mouth daily as needed for pain or headache.   amLODipine (NORVASC) 2.5 MG tablet take 1 tablet (2.5 mg) by mouth once daily for 30 days   aspirin EC 81 MG tablet Take 1 tablet (81 mg total) by mouth daily.   buPROPion (WELLBUTRIN XL) 300 MG 24 hr tablet Take 1 tablet by mouth daily   cholecalciferol (VITAMIN D) 25 MCG (1000 UNIT) tablet Take 2 tablets by mouth every day.   clonazePAM (KLONOPIN) 0.5 MG tablet Take 1 tablet (0.5 mg total) by mouth at bedtime.   clopidogrel (PLAVIX) 75 MG tablet TAKE 1 TABLET BY MOUTH DAILY   dapagliflozin propanediol (FARXIGA) 10 MG TABS tablet Take 1 tablet (10 mg total) by mouth daily before breakfast.   furosemide (LASIX) 40 MG tablet Take 1 tablet (40 mg total) by mouth daily as needed for fluid or edema. Take extra tab if weight is 185 lbs or greater   glimepiride (AMARYL) 1 MG tablet take 1 tablet (1 mg) by mouth once daily for 30 days   isosorbide mononitrate (IMDUR) 60 MG  24 hr tablet Take 1 tablet (60 mg total) by mouth daily.   lactulose, encephalopathy, (CHRONULAC) 10 GM/15ML SOLN Take 30 ML by mouth three times a week as needed for constipation   levothyroxine (SYNTHROID) 50 MCG tablet TAKE 1 TABLET BY MOUTH ONCE DAILY ON AN EMPTY STOMACH 30 MINUTES BEFORE BREAKFAST ON SAT AND SUNDAY   levothyroxine (SYNTHROID) 75 MCG tablet TAKE 1 TABLET BY MOUTH ONCE DAILY MONDAY THRU FRIDAY   metolazone (ZAROXOLYN) 2.5 MG tablet take 1 tablet (2.5 mg) by mouth once daily as needed for edema   metoprolol succinate (TOPROL-XL) 50 MG 24 hr tablet Take 1 tablet (50 mg total) by mouth at bedtime. Take with or immediately following a meal.   nitroGLYCERIN (NITROSTAT) 0.4 MG SL tablet Place 1 tablet (0.4 mg total) under the tongue every 5 (five) minutes as needed for chest pain.   ondansetron (ZOFRAN) 4 MG tablet take 1 tablet (4 mg) by mouth 2 times per day as needed for nausea   potassium chloride (KLOR-CON) 10 MEQ tablet Take 1 tablet (10 mEq total) by  mouth daily.   promethazine (PHENERGAN) 25 MG tablet take 1 tablet (25 mg) by mouth every 6 hours as needed for 30 days   ranolazine (RANEXA) 1000 MG SR tablet Take 1 tablet (1,000 mg total) by mouth 2 (two) times daily.   rOPINIRole (REQUIP) 1 MG tablet Take 1 tablet by mouth 3 times daily (Patient taking differently: Take 1 mg by mouth See admin instructions. Take one tablet (1 mg) by mouth three times daily - morning, afternoon and evening (take 4 mg tablet at bedtime))   rosuvastatin (CRESTOR) 40 MG tablet take 1 tablet (40 mg) by mouth once daily for 90 days   topiramate (TOPAMAX) 100 MG tablet TAKE 1 TABLET BY MOUTH TWICE DAILY.   traMADol (ULTRAM) 50 MG tablet take 1 tablet (50 mg) by mouth every 12 hours as needed for pain   traZODone (DESYREL) 150 MG tablet TAKE 1 TABLET BY MOUTH ONCE DAILY     Allergies:   Carbidopa-levodopa, Prednisone, and Venlafaxine   Social History   Socioeconomic History   Marital status: Married    Spouse name: Nyja Westbrook   Number of children: 2   Years of education: Not on file   Highest education level: Associate degree: occupational, Hotel manager, or vocational program  Occupational History   Occupation: disabilty    Comment: EMT/CNA @ [employer]  Tobacco Use   Smoking status: Former    Packs/day: 1.00    Years: 47.00    Pack years: 47.00    Types: Cigarettes    Quit date: 09/15/2019    Years since quitting: 1.8   Smokeless tobacco: Never  Vaping Use   Vaping Use: Every day   Start date: 09/15/2019   Substances: Nicotine  Substance and Sexual Activity   Alcohol use: No   Drug use: No   Sexual activity: Not on file  Other Topics Concern   Not on file  Social History Narrative   Not on file   Social Determinants of Health   Financial Resource Strain: High Risk   Difficulty of Paying Living Expenses: Very hard  Food Insecurity: No Food Insecurity   Worried About Charity fundraiser in the Last Year: Never true   Ran Out of Food  in the Last Year: Never true  Transportation Needs: No Transportation Needs   Lack of Transportation (Medical): No   Lack of Transportation (Non-Medical): No  Physical Activity: Not on file  Stress: Not on file  Social Connections: Not on file     Family History: The patient's family history includes Asthma in her mother; COPD in her mother; Congestive Heart Failure in her father; Diabetes in her mother; Macular degeneration in her mother.  ROS:   Please see the history of present illness.    All other systems reviewed and are negative.  EKGs/Labs/Other Studies Reviewed:    The following studies were reviewed today: EKG reveals sinus rhythm and nonspecific ST-T changes   Recent Labs: 01/01/2021: Magnesium 1.9 03/28/2021: ALT 10 06/08/2021: Hemoglobin 10.5; Platelets 241; TSH 4.261 07/03/2021: B Natriuretic Peptide 36.8; BUN 33; Creatinine, Ser 2.35; Potassium 3.3; Sodium 136  Recent Lipid Panel    Component Value Date/Time   CHOL 147 03/28/2021 0831   TRIG 147 03/28/2021 0831   HDL 71 03/28/2021 0831   CHOLHDL 2.1 03/28/2021 0831   CHOLHDL 3.3 09/18/2019 0355   VLDL 24 09/18/2019 0355   LDLCALC 51 03/28/2021 0831    Physical Exam:    VS:  BP (!) 118/58   Pulse 85   Ht 5\' 3"  (1.6 m)   Wt 174 lb 3.2 oz (79 kg)   SpO2 97%   BMI 30.86 kg/m     Wt Readings from Last 3 Encounters:  08/06/21 174 lb 3.2 oz (79 kg)  08/05/21 172 lb 9.6 oz (78.3 kg)  07/29/21 173 lb 12.8 oz (78.8 kg)     GEN: Patient is in no acute distress HEENT: Normal NECK: No JVD; No carotid bruits LYMPHATICS: No lymphadenopathy CARDIAC: Hear sounds regular, 2/6 systolic murmur at the apex. RESPIRATORY:  Clear to auscultation without rales, wheezing or rhonchi  ABDOMEN: Soft, non-tender, non-distended MUSCULOSKELETAL:  No edema; No deformity  SKIN: Warm and dry NEUROLOGIC:  Alert and oriented x 3 PSYCHIATRIC:  Normal affect   Signed, Jenean Lindau, MD  08/06/2021 2:06 PM    [place]

## 2021-08-06 NOTE — Patient Instructions (Signed)

## 2021-08-07 ENCOUNTER — Other Ambulatory Visit (HOSPITAL_BASED_OUTPATIENT_CLINIC_OR_DEPARTMENT_OTHER): Payer: Self-pay

## 2021-08-07 ENCOUNTER — Telehealth: Payer: Self-pay

## 2021-08-07 NOTE — Telephone Encounter (Signed)
Dr. Salley Scarlet, you saw this patient yesterday. Please comment on clearance and DAPT as requested.   Please forward your response to [facility].   Thank you

## 2021-08-07 NOTE — Telephone Encounter (Signed)
   [facility] Pre-operative Risk Assessment    Patient Name: Meghan Terry  DOB: [birth year] MRN: [id number]  HEARTCARE STAFF:  - IMPORTANT!!!!!! Under Visit Info/Reason for Call, type in Other and utilize the format Clearance MM/DD/YY or Clearance TBD. Do not use dashes or single digits. - Please review there is not already an duplicate clearance open for this procedure. - If request is for dental extraction, please clarify the # of teeth to be extracted. - If the patient is currently at the dentist's office, call Pre-Op Callback Staff (MA/nurse) to input urgent request.  - If the patient is not currently in the dentist office, please route to the Pre-Op pool.  Request for surgical clearance:  What type of surgery is being performed? colonoscopy  When is this surgery scheduled? 08/15/21   What type of clearance is required (medical clearance vs. Pharmacy clearance to hold med vs. Both)? Pharmacy  Are there any medications that need to be held prior to surgery and how long? Aspirin and Plavix start hold 08/10/21  Practice name and name of physician performing surgery? [facility] Dr. Lyda Jester  What is the office phone number? [phone]   7.   What is the office fax number? [phone]  8.   Anesthesia type (None, local, MAC, general) ? None listed   Truddie Hidden 08/07/2021, 7:52 AM  _________________________________________________________________   (provider comments below)

## 2021-08-07 NOTE — Telephone Encounter (Signed)
[facility] Dr. Crisoforo [place] office called this morning. They called and said they are closed on Fridays and need an answer from the office today

## 2021-08-08 ENCOUNTER — Other Ambulatory Visit (HOSPITAL_BASED_OUTPATIENT_CLINIC_OR_DEPARTMENT_OTHER): Payer: Self-pay

## 2021-08-11 ENCOUNTER — Other Ambulatory Visit (HOSPITAL_BASED_OUTPATIENT_CLINIC_OR_DEPARTMENT_OTHER): Payer: Self-pay

## 2021-08-12 ENCOUNTER — Other Ambulatory Visit (HOSPITAL_BASED_OUTPATIENT_CLINIC_OR_DEPARTMENT_OTHER): Payer: Self-pay

## 2021-08-12 NOTE — Progress Notes (Signed)
[facility] PREP Weekly Session  Patient Details  Name: Meghan Terry MRN: [id number] Date of Birth: 1961-03-24 Age: 60 y.o. PCP: Garwin Brothers, MD  Vitals:   08/12/21 1430  Weight: 172 lb 12.8 oz (78.4 kg)     [facility] Weekly seesion - 08/12/21 1600       [facility] "PREP" Location   [facility] "PREP" Location [place]      Weekly Session   Topic Discussed Health habits    Minutes exercised this week 195 minutes    Classes attended to date [place] 08/12/2021, 4:35 PM

## 2021-08-19 NOTE — Progress Notes (Signed)
[facility] PREP Weekly Session  Patient Details  Name: Meghan Terry MRN: [id number] Date of Birth: 06-15-1961 Age: 60 y.o. PCP: Garwin Brothers, MD  Vitals:   08/19/21 1603  Weight: 170 lb 12.8 oz (77.5 kg)     [facility] Weekly seesion - 08/19/21 1600       [facility] "PREP" Location   [facility]PRE[facility] Location [facility]      Weekly Session   Topic Discussed Restaurant Eating    Minutes exercised this week 330 minutes    Classes attended to date [place] 08/19/2021, 4:04 PM

## 2021-08-20 ENCOUNTER — Other Ambulatory Visit (HOSPITAL_BASED_OUTPATIENT_CLINIC_OR_DEPARTMENT_OTHER): Payer: Self-pay

## 2021-08-20 MED ORDER — POTASSIUM CHLORIDE CRYS ER 20 MEQ PO TBCR
EXTENDED_RELEASE_TABLET | ORAL | 3 refills | Status: DC
  Filled 2021-08-20: qty 40, 35d supply, fill #0
  Filled 2021-09-25: qty 40, 35d supply, fill #1
  Filled 2021-11-19: qty 40, 35d supply, fill #2
  Filled 2021-12-28: qty 40, 35d supply, fill #3

## 2021-08-25 ENCOUNTER — Other Ambulatory Visit (HOSPITAL_BASED_OUTPATIENT_CLINIC_OR_DEPARTMENT_OTHER): Payer: Self-pay

## 2021-08-26 NOTE — Progress Notes (Unsigned)
See flow sheet

## 2021-08-28 ENCOUNTER — Other Ambulatory Visit (HOSPITAL_COMMUNITY): Payer: Self-pay | Admitting: Cardiology

## 2021-08-28 ENCOUNTER — Other Ambulatory Visit (HOSPITAL_BASED_OUTPATIENT_CLINIC_OR_DEPARTMENT_OTHER): Payer: Self-pay

## 2021-08-28 MED ORDER — DAPAGLIFLOZIN PROPANEDIOL 10 MG PO TABS
10.0000 mg | ORAL_TABLET | Freq: Every day | ORAL | 0 refills | Status: DC
Start: 2021-08-28 — End: 2021-09-25
  Filled 2021-08-28: qty 30, 30d supply, fill #0

## 2021-08-29 ENCOUNTER — Emergency Department (HOSPITAL_COMMUNITY): Payer: 59

## 2021-08-29 ENCOUNTER — Emergency Department (HOSPITAL_COMMUNITY)
Admission: EM | Admit: 2021-08-29 | Discharge: 2021-08-29 | Disposition: A | Discharge status: Home / Self Care | Payer: 59 | Attending: Emergency Medicine | Admitting: Emergency Medicine

## 2021-08-29 ENCOUNTER — Encounter (HOSPITAL_COMMUNITY): Payer: Self-pay | Admitting: Emergency Medicine

## 2021-08-29 ENCOUNTER — Telehealth: Payer: Self-pay | Admitting: Cardiology

## 2021-08-29 DIAGNOSIS — R0602 Shortness of breath: Secondary | ICD-10-CM | POA: Insufficient documentation

## 2021-08-29 DIAGNOSIS — Z5321 Procedure and treatment not carried out due to patient leaving prior to being seen by health care provider: Secondary | ICD-10-CM | POA: Diagnosis not present

## 2021-08-29 DIAGNOSIS — R079 Chest pain, unspecified: Secondary | ICD-10-CM | POA: Insufficient documentation

## 2021-08-29 DIAGNOSIS — I509 Heart failure, unspecified: Secondary | ICD-10-CM | POA: Insufficient documentation

## 2021-08-29 DIAGNOSIS — R5383 Other fatigue: Secondary | ICD-10-CM | POA: Insufficient documentation

## 2021-08-29 LAB — TROPONIN I (HIGH SENSITIVITY): Troponin I (High Sensitivity): 5 ng/L (ref <18)

## 2021-08-29 LAB — CBC WITH DIFFERENTIAL/PLATELET
Abs Immature Granulocytes: 0.08 10*3/uL — ABNORMAL HIGH (ref 0.00–0.07)
Basophils Absolute: 0.1 10*3/uL (ref 0.0–0.1)
Basophils Relative: 1 %
Eosinophils Absolute: 0.2 10*3/uL (ref 0.0–0.5)
Eosinophils Relative: 3 %
HCT: 35.8 % — ABNORMAL LOW (ref 36.0–46.0)
Hemoglobin: 11.2 g/dL — ABNORMAL LOW (ref 12.0–15.0)
Immature Granulocytes: 1 %
Lymphocytes Relative: 19 %
Lymphs Abs: 1.6 10*3/uL (ref 0.7–4.0)
MCH: 29.2 pg (ref 26.0–34.0)
MCHC: 31.3 g/dL (ref 30.0–36.0)
MCV: 93.2 fL (ref 80.0–100.0)
Monocytes Absolute: 0.5 10*3/uL (ref 0.1–1.0)
Monocytes Relative: 6 %
Neutro Abs: 6 10*3/uL (ref 1.7–7.7)
Neutrophils Relative %: 70 %
Platelets: 302 10*3/uL (ref 150–400)
RBC: 3.84 MIL/uL — ABNORMAL LOW (ref 3.87–5.11)
RDW: 14.7 % (ref 11.5–15.5)
WBC: 8.4 10*3/uL (ref 4.0–10.5)
nRBC: 0 % (ref 0.0–0.2)

## 2021-08-29 LAB — BASIC METABOLIC PANEL
Anion gap: 8 (ref 5–15)
BUN: 37 mg/dL — ABNORMAL HIGH (ref 8–23)
CO2: 23 mmol/L (ref 22–32)
Calcium: 8.9 mg/dL (ref 8.9–10.3)
Chloride: 107 mmol/L (ref 98–111)
Creatinine, Ser: 2.37 mg/dL — ABNORMAL HIGH (ref 0.44–1.00)
GFR, Estimated: 23 mL/min — ABNORMAL LOW (ref >60)
Glucose, Bld: 133 mg/dL — ABNORMAL HIGH (ref 70–99)
Potassium: 4.3 mmol/L (ref 3.5–5.1)
Sodium: 138 mmol/L (ref 135–145)

## 2021-08-29 LAB — BRAIN NATRIURETIC PEPTIDE: B Natriuretic Peptide: 132.5 pg/mL — ABNORMAL HIGH (ref 0.0–100.0)

## 2021-08-29 NOTE — Telephone Encounter (Signed)
Spoke to the patient just now and she let me know that she has gained 10 lbs in the last 3 days. She reports that her swelling is everywhere but mostly her lower extremities. She is short of breath all of the time. She reports that this started last night and has been continuous due to the fluid increase. She is also having chest pain. She states that this comes and goes and is not a sharp pain but is concerning to her.   She has taken her metolazone daily for the last week as well as her furosemide.   She is unable to take her vitals at this time.   I advised that the patient proceed to the ED at this time as she is audibly SOB while I am on the phone with her, having chest pain, and the weight gain is concerning.   She states that she will do so and I will route this to Dr. Geraldo Pitter to make him aware.    Encouraged patient to call back with any questions or concerns.

## 2021-08-29 NOTE — ED Triage Notes (Signed)
Patient complains of central sharp chest pain with radiation into back that started a few days ago, history of CHF, also reports 10 pound weight gain over the last two days and new shortness of breath and fatigue. Patient alert, oriented, speaking in complete sentences, and in no apparent distress at this time.

## 2021-08-29 NOTE — ED Provider Notes (Signed)
Emergency Medicine Provider Triage Evaluation Note  Meghan Terry , a 60 y.o. female  was evaluated in triage.  Pt complains of central, sharp, chest pain onset 2 days ago.  She reports her chest pain radiates to her back.  Patient has a history of CHF and notes a 10 pound weight gain over the last 2 days.  Patient has associated shortness of breath and fatigue.  Patient spoke with her cardiologist this morning who recommended she come to the ED for further evaluation.  Patient has taken 2 nitros today with no relief of her symptoms.  Patient denies abdominal pain, nausea, vomiting, fever, chills.  Review of Systems  Positive: Chest pain, shortness of breath Negative: Abdominal pain, nausea, vomiting  Physical Exam  BP 116/68 (BP Location: Right Arm)   Pulse 87   Temp 98.2 F (36.8 C)   Resp 18   SpO2 100%  Gen:   Awake, no distress   Resp:  Normal effort, decreased breath sounds throughout MSK:   Moves extremities without difficulty  Other:  No abdominal tenderness to palpation  Medical Decision Making  Medically screening exam initiated at 10:49 AM.  Appropriate orders placed.  Meghan Terry was informed that the remainder of the evaluation will be completed by another provider, this initial triage assessment does not replace that evaluation, and the importance of remaining in the ED until their evaluation is complete.    Meghan Terry A, PA-C 08/29/21 1051    Meghan Terry, [place] 08/29/21 1645

## 2021-08-29 NOTE — ED Notes (Signed)
Patient decided to leave.   

## 2021-08-29 NOTE — Telephone Encounter (Signed)
Pt c/o swelling: STAT is pt has developed SOB within 24 hours  If swelling, where is the swelling located? Mainly legs and ankles, she states everywhere  How much weight have you gained and in what time span? 10 in 3 days  Have you gained 3 pounds in a day or 5 pounds in a week? Yes to both  Do you have a log of your daily weights (if so, list)?   11/23  170  11/29 170  11/30 173  12/1 178  12/2 183  Are you currently taking a fluid pill? yes  Are you currently SOB? Yes, started last night  Have you traveled recently? no

## 2021-08-30 DIAGNOSIS — E785 Hyperlipidemia, unspecified: Secondary | ICD-10-CM

## 2021-08-30 DIAGNOSIS — R600 Localized edema: Secondary | ICD-10-CM | POA: Diagnosis not present

## 2021-08-30 DIAGNOSIS — N289 Disorder of kidney and ureter, unspecified: Secondary | ICD-10-CM | POA: Diagnosis not present

## 2021-08-30 DIAGNOSIS — I2510 Atherosclerotic heart disease of native coronary artery without angina pectoris: Secondary | ICD-10-CM

## 2021-08-31 DIAGNOSIS — I2510 Atherosclerotic heart disease of native coronary artery without angina pectoris: Secondary | ICD-10-CM | POA: Diagnosis not present

## 2021-08-31 DIAGNOSIS — R600 Localized edema: Secondary | ICD-10-CM | POA: Diagnosis not present

## 2021-08-31 DIAGNOSIS — E785 Hyperlipidemia, unspecified: Secondary | ICD-10-CM | POA: Diagnosis not present

## 2021-08-31 DIAGNOSIS — N289 Disorder of kidney and ureter, unspecified: Secondary | ICD-10-CM | POA: Diagnosis not present

## 2021-09-01 ENCOUNTER — Other Ambulatory Visit (HOSPITAL_BASED_OUTPATIENT_CLINIC_OR_DEPARTMENT_OTHER): Payer: Self-pay

## 2021-09-01 MED ORDER — RANOLAZINE ER 500 MG PO TB12
ORAL_TABLET | ORAL | 0 refills | Status: DC
  Filled 2021-09-01 – 2021-09-25 (×2): qty 60, 30d supply, fill #0

## 2021-09-03 ENCOUNTER — Telehealth: Payer: Self-pay | Admitting: Cardiology

## 2021-09-03 ENCOUNTER — Encounter: Payer: Self-pay | Admitting: Cardiology

## 2021-09-03 NOTE — Telephone Encounter (Signed)
Pt is reaching out to follow up on my chart message she sent this morning:   " Sir,  Since I left the hospital my blood pressure has been rising.  09/01/21.  126/75  82 p 09/02/21.   131/88  102 p in the morning  09/02/21.    152/89. 114 p at night  09/03/21.    145/86  106 p this morning    I am not sure what to do? I really don't feel very well at this time. My head hurts and I am tried. Can you please advise me on what to do about my blood pressure.  Thank you  Meghan Terry "  Pt c/o BP issue: STAT if pt c/o blurred vision, one-sided weakness or slurred speech  1. What are your last 5 BP readings? Listed above  2. Are you having any other symptoms (ex. Dizziness, headache, blurred vision, passed out)? Headache & Fatigue   3. What is your BP issue? Blood pressure is elevated

## 2021-09-05 ENCOUNTER — Telehealth: Payer: Self-pay

## 2021-09-05 NOTE — Telephone Encounter (Signed)
Text sent to patient after no show at PREP class. Awaiting response.

## 2021-09-07 ENCOUNTER — Other Ambulatory Visit (HOSPITAL_BASED_OUTPATIENT_CLINIC_OR_DEPARTMENT_OTHER): Payer: Self-pay

## 2021-09-08 ENCOUNTER — Other Ambulatory Visit (HOSPITAL_BASED_OUTPATIENT_CLINIC_OR_DEPARTMENT_OTHER): Payer: Self-pay

## 2021-09-08 MED ORDER — CLONAZEPAM 0.5 MG PO TABS
0.5000 mg | ORAL_TABLET | Freq: Every day | ORAL | 2 refills | Status: DC
  Filled 2021-09-08: qty 30, 30d supply, fill #0
  Filled 2021-10-12: qty 30, 30d supply, fill #1
  Filled 2021-10-21 – 2021-11-19 (×2): qty 30, 30d supply, fill #2

## 2021-09-09 ENCOUNTER — Other Ambulatory Visit (HOSPITAL_BASED_OUTPATIENT_CLINIC_OR_DEPARTMENT_OTHER): Payer: Self-pay

## 2021-09-09 NOTE — Progress Notes (Signed)
[facility] PREP Weekly Session  Patient Details  Name: Meghan Terry MRN: [id number] Date of Birth: May 26, 1961 Age: 60 y.o. PCP: Garwin Brothers, MD  Vitals:   09/09/21 1632  Weight: 170 lb 12.8 oz (77.5 kg)     [facility] Weekly seesion - 09/09/21 1600       [facility] "PREP" Location   [facility] Location [facility]      Weekly Session   Topic Discussed Other   Portion size   Minutes exercised this week --   Had recent hosp stay   Classes attended to date 83           Had a recent hospital after having CP and gaining 10 lbs. She was not able to figure out why she put on fluid. Adjust meds.  Feels better but tired. Was able to do about 10 min on treadmill today with 1 set of leg presses. HR appeared elevated on treadmill but pulse ox-pulse was 50-62. Plans on coming on Thursday.    Barnett Hatter 09/09/2021, 4:34 PM

## 2021-09-11 ENCOUNTER — Other Ambulatory Visit (HOSPITAL_BASED_OUTPATIENT_CLINIC_OR_DEPARTMENT_OTHER): Payer: Self-pay

## 2021-09-15 ENCOUNTER — Other Ambulatory Visit (HOSPITAL_BASED_OUTPATIENT_CLINIC_OR_DEPARTMENT_OTHER): Payer: Self-pay

## 2021-09-15 MED ORDER — ONDANSETRON 4 MG PO TBDP
ORAL_TABLET | ORAL | 0 refills | Status: DC
  Filled 2021-09-15: qty 10, 3d supply, fill #0

## 2021-09-15 MED ORDER — OXYCODONE HCL 5 MG PO TABS
ORAL_TABLET | ORAL | 0 refills | Status: DC
  Filled 2021-09-15: qty 6, 1d supply, fill #0

## 2021-09-16 ENCOUNTER — Other Ambulatory Visit (HOSPITAL_BASED_OUTPATIENT_CLINIC_OR_DEPARTMENT_OTHER): Payer: Self-pay

## 2021-09-16 MED ORDER — ROPINIROLE HCL 4 MG PO TABS
ORAL_TABLET | ORAL | 3 refills | Status: DC
  Filled 2021-09-16: qty 90, 90d supply, fill #0
  Filled 2021-12-15: qty 90, 90d supply, fill #1

## 2021-09-23 NOTE — Progress Notes (Signed)
[facility] PREP Weekly Session  Patient Details  Name: Meghan Terry MRN: [id number] Date of Birth: Nov 22, 1960 Age: 60 y.o. PCP: Garwin Brothers, MD  Vitals:   09/23/21 1616  Weight: 165 lb 6.4 oz (75 kg)     [facility] Weekly seesion - 09/23/21 1600       [facility] "PREP" Location   [facility]PREP[facility] Location [facility]      Weekly Session   Topic Discussed Calorie breakdown    Minutes exercised this week 70 minutes    Classes attended to date 52           Started work on Chief of Staff of goals for end of program   Barnett Hatter 09/23/2021, 4:16 PM

## 2021-09-24 ENCOUNTER — Ambulatory Visit (INDEPENDENT_AMBULATORY_CARE_PROVIDER_SITE_OTHER): Payer: 59 | Admitting: Cardiology

## 2021-09-24 ENCOUNTER — Other Ambulatory Visit: Payer: Self-pay

## 2021-09-24 ENCOUNTER — Encounter: Payer: Self-pay | Admitting: Cardiology

## 2021-09-24 VITALS — BP 104/60 | HR 70 | Ht 63.0 in | Wt 165.4 lb

## 2021-09-24 DIAGNOSIS — I2510 Atherosclerotic heart disease of native coronary artery without angina pectoris: Secondary | ICD-10-CM

## 2021-09-24 DIAGNOSIS — E785 Hyperlipidemia, unspecified: Secondary | ICD-10-CM

## 2021-09-24 DIAGNOSIS — N183 Chronic kidney disease, stage 3 unspecified: Secondary | ICD-10-CM

## 2021-09-24 DIAGNOSIS — I10 Essential (primary) hypertension: Secondary | ICD-10-CM

## 2021-09-24 DIAGNOSIS — I5031 Acute diastolic (congestive) heart failure: Secondary | ICD-10-CM | POA: Diagnosis not present

## 2021-09-24 DIAGNOSIS — Z951 Presence of aortocoronary bypass graft: Secondary | ICD-10-CM

## 2021-09-24 NOTE — Patient Instructions (Signed)
Medication Instructions:  Your physician has recommended you make the following change in your medication:   Stop Norvasc  *If you need a refill on your cardiac medications before your next appointment, please call your pharmacy*   Lab Work: None ordered If you have labs (blood work) drawn today and your tests are completely normal, you will receive your results only by: [place] (if you have MyChart) OR A paper copy in the mail If you have any lab test that is abnormal or we need to change your treatment, we will call you to review the results.   Testing/Procedures: None ordered   Follow-Up: At [facility], you and your health needs are our priority.  As part of our continuing mission to provide you with exceptional heart care, we have created designated Provider Care Teams.  These Care Teams include your primary Cardiologist (physician) and Advanced Practice Providers (APPs -  Physician Assistants and Nurse Practitioners) who all work together to provide you with the care you need, when you need it.  We recommend signing up for the patient portal called "MyChart".  Sign up information is provided on this After Visit Summary.  MyChart is used to connect with patients for Virtual Visits (Telemedicine).  Patients are able to view lab/test results, encounter notes, upcoming appointments, etc.  Non-urgent messages can be sent to your provider as well.   To learn more about what you can do with MyChart, go to [email or web address].    Your next appointment:   1 month(s)  The format for your next appointment:   In Person  Provider:   Jyl Heinz, MD   Other Instructions NA

## 2021-09-24 NOTE — Progress Notes (Signed)
Cardiology Office Note:    Date:  09/24/2021   ID:  Meghan Terry, DOB Jul 30, 1961, MRN [id number]  PCP:  Garwin Brothers, MD  Cardiologist:  Jenean Lindau, MD   Referring MD: Garwin Brothers, MD    ASSESSMENT:    1. Acute diastolic CHF (congestive heart failure) ([name])   2. Coronary artery disease involving native coronary artery of native heart without angina pectoris   3. Essential hypertension   4. Hyperlipidemia with target LDL less than 70   5. Stage 3 chronic kidney disease, unspecified whether stage 3a or 3b CKD ([name])   6. S/P CABG x 3    PLAN:    In order of problems listed above:  Congestive heart failure: Congestive heart failure education was given to the patient.  Diet and weight check on a regular basis was emphasized.  Salt intake issues were discussed.  Patient understands and agrees.  Hospital records were reviewed extensively.  Questions were answered to her satisfaction.  Her renal insufficiency issues make her management challenging. Dizziness: Her blood pressure is borderline so have discontinued amlodipine 2.5 mg daily.  She will keep a track of her blood pressures at home. Coronary artery disease: Stable at this time and asymptomatic.  She takes good care of herself and exercises on a regular basis at the gym. Mixed dyslipidemia diabetes mellitus: On statin therapy.  Diet was emphasized.  She is doing well with diet and exercise.  Compliance urged. Patient will be seen in follow-up appointment in 4 weeks or earlier if the patient has any concerns    Medication Adjustments/Labs and Tests Ordered: Current medicines are reviewed at length with the patient today.  Concerns regarding medicines are outlined above.  No orders of the defined types were placed in this encounter.  No orders of the defined types were placed in this encounter.    No chief complaint on file.    History of Present Illness:    Meghan Terry is a 60 y.o. female.  Patient has past medical  history of coronary artery disease post CABG surgery, essential hypertension, dyslipidemia and kidney insufficiency.  She was recently admitted to [facility] and significant diuresis has made the patient feel much better.  Subsequently patient denies any issues at this time.  No chest pain orthopnea or PND.  She was put back on her blood pressure medications and her blood pressure stable.  She tells me post hospital discharge her blood pressure was markedly elevated.  However she gives some history of mild hypotension and postural hypotension kind of symptoms.  At the time of my evaluation, the patient is alert awake oriented and in no distress.  Past Medical History:  Diagnosis Date   Acute diastolic CHF (congestive heart failure) ([place]) 06/08/2021   Acute pulmonary edema (HCC)    Angina pectoris ([place]) 11/17/2019   Anxiety    Atypical chest pain 12/30/2020   CHF (congestive heart failure) ([place]) 06/08/2021   CKD (chronic kidney disease) stage 3b, GFR 30-59 ml/min 12/03/2019   Coronary artery disease    Coronary artery disease involving native coronary artery of native heart with angina pectoris ([place]) 10/03/2019   Depression 04/25/2017   Diabetes mellitus due to underlying condition with unspecified complications ([place]) 02/27/3761   Essential hypertension 09/05/2019   Ex-smoker 09/05/2019   Family history of coronary artery disease 09/05/2019   History of depression 04/15/2020   Hx of diabetes mellitus 04/15/2020   Hyperlipidemia with target LDL less than 70 11/24/2019  Hypertension    Hypothyroid    Migraine 04/25/2017   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   Thyroid disease     Past Surgical History:  Procedure Laterality  Date   ABDOMINAL HYSTERECTOMY     APPENDECTOMY     CARDIAC CATHETERIZATION  11/22/2019   CHOLECYSTECTOMY     COLONOSCOPY     CORONARY ARTERY BYPASS GRAFT N/A 09/15/2019   Procedure: CORONARY ARTERY BYPASS GRAFTING (CABG) times three on pump using left internal mammary artery and right and left greater saphenous veins harvested endoscopically;  Surgeon: Gaye Pollack, MD;  Location: [facility];  Service: Open Heart Surgery;  Laterality: N/A;   CORONARY STENT INTERVENTION N/A 11/23/2019   Procedure: CORONARY STENT INTERVENTION;  Surgeon: [name], MD;  Location: [facility];  Service: Cardiovascular;  Laterality: N/A;   KNEE ARTHROSCOPY     LEFT HEART CATH AND CORONARY ANGIOGRAPHY N/A 09/14/2019   Procedure: LEFT HEART CATH AND CORONARY ANGIOGRAPHY;  Surgeon: [name], MD;  Location: [facility];  Service: Cardiovascular;  Laterality: N/A;   LEFT HEART CATH AND CORS/GRAFTS ANGIOGRAPHY N/A 11/22/2019   Procedure: LEFT HEART CATH AND CORS/GRAFTS ANGIOGRAPHY;  Surgeon: [name], MD;  Location: [facility];  Service: Cardiovascular;  Laterality: N/A;   LEFT HEART CATH AND CORS/GRAFTS ANGIOGRAPHY N/A 03/13/2020   Procedure: LEFT HEART CATH AND CORS/GRAFTS ANGIOGRAPHY;  Surgeon: Lorretta Harp, MD;  Location: [facility];  Service: Cardiovascular;  Laterality: N/A;   LEFT HEART CATH AND CORS/GRAFTS ANGIOGRAPHY N/A 12/31/2020   Procedure: LEFT HEART CATH AND CORS/GRAFTS ANGIOGRAPHY;  Surgeon: Belva Crome, MD;  Location: [facility];  Service: Cardiovascular;  Laterality: N/A;   OSTEOCHONDRAL DEFECT REPAIR/RECONSTRUCTION Left 11/29/2014   Procedure: LEFT ANKLE MEDIAL MALLEOLUS OSTEOTOMY,AUTO GRAFT FROM CALCANEOUS;  OS TIBIA DENORO GRAFTING TALUS;  Surgeon: Wylene Simmer, MD;  Location: [facility];  Service: Orthopedics;  Laterality: Left;   TEE WITHOUT CARDIOVERSION N/A 09/15/2019   Procedure: TRANSESOPHAGEAL ECHOCARDIOGRAM (TEE);   Surgeon: Gaye Pollack, MD;  Location: [facility];  Service: Open Heart Surgery;  Laterality: N/A;   TUBAL LIGATION      Current Medications: Current Meds  Medication Sig   acetaminophen (TYLENOL) 500 MG tablet Take 500-1,000 mg by mouth daily as needed for pain or headache.   aspirin EC 81 MG tablet Take 1 tablet (81 mg total) by mouth daily.   buPROPion (WELLBUTRIN XL) 300 MG 24 hr tablet Take 1 tablet by mouth daily   cholecalciferol (VITAMIN D) 25 MCG (1000 UNIT) tablet Take 2 tablets by mouth every day.   clonazePAM (KLONOPIN) 0.5 MG tablet Take 1 tablet (0.5 mg total) by mouth at bedtime.   clopidogrel (PLAVIX) 75 MG tablet TAKE 1 TABLET BY MOUTH DAILY   dapagliflozin propanediol (FARXIGA) 10 MG TABS tablet Take 1 tablet (10 mg total) by mouth daily before breakfast.   furosemide (LASIX) 40 MG tablet Take 1 tablet (40 mg total) by mouth daily as needed for fluid or edema. Take extra tab if weight is 185 lbs or greater   glimepiride (AMARYL) 1 MG tablet take 1 tablet (1 mg)  by mouth once daily for 30 days   lactulose, encephalopathy, (CHRONULAC) 10 GM/15ML SOLN Take 30 ML by mouth three times a week as needed for constipation   levothyroxine (SYNTHROID) 50 MCG tablet TAKE 1 TABLET BY MOUTH ONCE DAILY ON AN EMPTY STOMACH 30 MINUTES BEFORE BREAKFAST ON SAT AND SUNDAY   levothyroxine (SYNTHROID) 75 MCG tablet TAKE 1 TABLET BY MOUTH ONCE DAILY MONDAY THRU FRIDAY   metolazone (ZAROXOLYN) 2.5 MG tablet take 1 tablet (2.5 mg) by mouth once daily as needed for edema   metoprolol succinate (TOPROL-XL) 50 MG 24 hr tablet Take 1 tablet (50 mg total) by mouth at bedtime. Take with or immediately following a meal.   nitroGLYCERIN (NITROSTAT) 0.4 MG SL tablet Place 1 tablet (0.4 mg total) under the tongue every 5 (five) minutes as needed for chest pain.   ondansetron (ZOFRAN) 4 MG tablet take 1 tablet (4 mg) by mouth 2 times per day as needed for nausea   ondansetron (ZOFRAN-ODT) 4 MG disintegrating  tablet Dissolve 1 tablet under the tongue every 6 hours as needed for nausea & vomiting   oxyCODONE (OXY IR/ROXICODONE) 5 MG immediate release tablet Take 1 tablet by mouth every 4 hours as needed for pain   potassium chloride (KLOR-CON) 10 MEQ tablet Take 1 tablet (10 mEq total) by mouth daily.   potassium chloride SA (KLOR-CON M20) 20 MEQ tablet Take 1 tablet by mouth twice daily for 5 days then decrease to 1 tablet daily   promethazine (PHENERGAN) 25 MG tablet take 1 tablet (25 mg) by mouth every 6 hours as needed for 30 days   ranolazine (RANEXA) 1000 MG SR tablet Take 1 tablet (1,000 mg total) by mouth 2 (two) times daily.   ranolazine (RANEXA) 500 MG 12 hr tablet Take 1 tablet by mouth 2 times daily   rOPINIRole (REQUIP) 1 MG tablet Take 1 tablet by mouth 3 times daily (Patient taking differently: Take 1 mg by mouth See admin instructions. Take one tablet (1 mg) by mouth three times daily - morning, afternoon and evening (take 4 mg tablet at bedtime))   rOPINIRole (REQUIP) 4 MG tablet TAKE 1 TABLET BY MOUTH 1-3 HOURS BEFORE BEDTIME   rosuvastatin (CRESTOR) 40 MG tablet take 1 tablet (40 mg) by mouth once daily for 90 days   topiramate (TOPAMAX) 100 MG tablet TAKE 1 TABLET BY MOUTH TWICE DAILY.   traMADol (ULTRAM) 50 MG tablet take 1 tablet (50 mg) by mouth every 12 hours as needed for pain   traZODone (DESYREL) 150 MG tablet TAKE 1 TABLET BY MOUTH ONCE DAILY   [DISCONTINUED] amLODipine (NORVASC) 2.5 MG tablet take 1 tablet (2.5 mg) by mouth once daily for 30 days     Allergies:   Carbidopa-levodopa, Prednisone, and Venlafaxine   Social History   Socioeconomic History   Marital status: Married    Spouse name: Abbrielle Batts   Number of children: 2   Years of education: Not on file   Highest education level: Associate degree: occupational, Hotel manager, or vocational program  Occupational History   Occupation: disabilty    Comment: EMT/CNA @ [employer]  Tobacco Use   Smoking status:  Former    Packs/day: 1.00    Years: 47.00    Pack years: 47.00    Types: Cigarettes    Quit date: 09/15/2019    Years since quitting: 2.0    Passive exposure: Past   Smokeless tobacco: Never  Vaping Use   Vaping Use: Every  day   Start date: 09/15/2019   Substances: Nicotine  Substance and Sexual Activity   Alcohol use: No   Drug use: No   Sexual activity: Not on file  Other Topics Concern   Not on file  Social History Narrative   Not on file   Social Determinants of Health   Financial Resource Strain: High Risk   Difficulty of Paying Living Expenses: Very hard  Food Insecurity: No Food Insecurity   Worried About Running Out of Food in the Last Year: Never true   Ran Out of Food in the Last Year: Never true  Transportation Needs: No Transportation Needs   Lack of Transportation (Medical): No   Lack of Transportation (Non-Medical): No  Physical Activity: Not on file  Stress: Not on file  Social Connections: Not on file     Family History: The patient's family history includes Asthma in her mother; COPD in her mother; Congestive Heart Failure in her father; Diabetes in her mother; Macular degeneration in her mother.  ROS:   Please see the history of present illness.    All other systems reviewed and are negative.  EKGs/Labs/Other Studies Reviewed:    The following studies were reviewed today: I discussed my findings with the patient at length.   Recent Labs: 01/01/2021: Magnesium 1.9 03/28/2021: ALT 10 06/08/2021: TSH 4.261 08/29/2021: B Natriuretic Peptide 132.5; BUN 37; Creatinine, Ser 2.37; Hemoglobin 11.2; Platelets 302; Potassium 4.3; Sodium 138  Recent Lipid Panel    Component Value Date/Time   CHOL 147 03/28/2021 0831   TRIG 147 03/28/2021 0831   HDL 71 03/28/2021 0831   CHOLHDL 2.1 03/28/2021 0831   CHOLHDL 3.3 09/18/2019 0355   VLDL 24 09/18/2019 0355   LDLCALC 51 03/28/2021 0831    Physical Exam:    VS:  BP 104/60    Pulse 70    Ht 5\' 3"  (1.6 m)     Wt 165 lb 6.4 oz (75 kg)    SpO2 96%    BMI 29.30 kg/m     Wt Readings from Last 3 Encounters:  09/24/21 165 lb 6.4 oz (75 kg)  09/23/21 165 lb 6.4 oz (75 kg)  09/09/21 170 lb 12.8 oz (77.5 kg)     GEN: Patient is in no acute distress HEENT: Normal NECK: No JVD; No carotid bruits LYMPHATICS: No lymphadenopathy CARDIAC: Hear sounds regular, 2/6 systolic murmur at the apex. RESPIRATORY:  Clear to auscultation without rales, wheezing or rhonchi  ABDOMEN: Soft, non-tender, non-distended MUSCULOSKELETAL:  No edema; No deformity  SKIN: Warm and dry NEUROLOGIC:  Alert and oriented x 3 PSYCHIATRIC:  Normal affect   Signed, Jenean Lindau, MD  09/24/2021 3:07 PM    [facility]

## 2021-09-25 ENCOUNTER — Other Ambulatory Visit (HOSPITAL_COMMUNITY): Payer: Self-pay | Admitting: Cardiology

## 2021-09-25 ENCOUNTER — Other Ambulatory Visit (HOSPITAL_BASED_OUTPATIENT_CLINIC_OR_DEPARTMENT_OTHER): Payer: Self-pay

## 2021-09-25 MED ORDER — DAPAGLIFLOZIN PROPANEDIOL 10 MG PO TABS
10.0000 mg | ORAL_TABLET | Freq: Every day | ORAL | 6 refills | Status: DC
  Filled 2021-09-25: qty 30, 30d supply, fill #0
  Filled 2021-11-02: qty 30, 30d supply, fill #1
  Filled 2021-12-15: qty 90, 90d supply, fill #2
  Filled 2022-04-23: qty 60, 60d supply, fill #3

## 2021-09-25 MED ORDER — GLIMEPIRIDE 1 MG PO TABS
ORAL_TABLET | ORAL | 3 refills | Status: DC
  Filled 2021-09-25: qty 30, 30d supply, fill #0
  Filled 2021-11-02: qty 30, 30d supply, fill #1
  Filled 2021-12-15: qty 30, 30d supply, fill #2
  Filled 2022-01-31: qty 30, 30d supply, fill #3

## 2021-09-25 NOTE — Telephone Encounter (Signed)
Farxiga 10 mg tablets # 30 x 6 refills sent to  [facility]

## 2021-09-26 ENCOUNTER — Other Ambulatory Visit (HOSPITAL_BASED_OUTPATIENT_CLINIC_OR_DEPARTMENT_OTHER): Payer: Self-pay

## 2021-09-30 ENCOUNTER — Other Ambulatory Visit (HOSPITAL_BASED_OUTPATIENT_CLINIC_OR_DEPARTMENT_OTHER): Payer: Self-pay

## 2021-09-30 NOTE — Progress Notes (Signed)
[facility] PREP Weekly Session  Patient Details  Name: Meghan Terry MRN: [id number] Date of Birth: 03/21/1961 Age: 61 y.o. PCP: Garwin Brothers, MD  Vitals:   09/29/21 1400  Weight: 166 lb 3.2 oz (75.4 kg)     [facility] Weekly seesion - 09/30/21 1700       [facility] "PREP" Location   [facility]PREP[facility]      Weekly Session   Topic Discussed Hitting roadblocks    Minutes exercised this week 300 minutes    Classes attended to date 58             [place] 09/30/2021, 5:17 PM

## 2021-10-02 ENCOUNTER — Other Ambulatory Visit (HOSPITAL_BASED_OUTPATIENT_CLINIC_OR_DEPARTMENT_OTHER): Payer: Self-pay

## 2021-10-02 MED ORDER — CIPROFLOXACIN HCL 500 MG PO TABS
ORAL_TABLET | ORAL | 0 refills | Status: DC
  Filled 2021-10-02: qty 14, 7d supply, fill #0

## 2021-10-07 ENCOUNTER — Other Ambulatory Visit (HOSPITAL_BASED_OUTPATIENT_CLINIC_OR_DEPARTMENT_OTHER): Payer: Self-pay

## 2021-10-07 MED ORDER — NITROFURANTOIN MONOHYD MACRO 100 MG PO CAPS
ORAL_CAPSULE | ORAL | 0 refills | Status: DC
  Filled 2021-10-07: qty 14, 7d supply, fill #0

## 2021-10-07 NOTE — Progress Notes (Signed)
[facility] PREP Weekly Session  Patient Details  Name: Meghan Terry MRN: [id number] Date of Birth: 1961/03/03 Age: 61 y.o. PCP: Garwin Brothers, MD  Vitals:   10/07/21 1400  Weight: 168 lb (76.2 kg)     [facility] Weekly seesion - 10/07/21 1700       [facility] "PREP" Location   [facility] "PREP" Location [facility]      Weekly Session   Topic Discussed --   Goals and surveys   Minutes exercised this week 415 minutes    Classes attended to date 14             [place] 10/07/2021, 5:06 PM

## 2021-10-13 ENCOUNTER — Other Ambulatory Visit (HOSPITAL_BASED_OUTPATIENT_CLINIC_OR_DEPARTMENT_OTHER): Payer: Self-pay

## 2021-10-15 NOTE — Progress Notes (Signed)
[facility] PREP Evaluation  Patient Details  Name: Meghan Terry MRN: [id number] Date of Birth: [birth year] Age: 61 y.o. PCP: Garwin Brothers, MD  Vitals:   10/14/21 1330  BP: 102/64  Pulse: 67  SpO2: 100%  Weight: 161 lb 9.6 oz (73.3 kg)     [facility] Eval - 10/15/21 0900       [facility] "PREP" Location   [facility] "PREP" Location [facility]      Referral    Program Start Date --   PREP final date 10/14/21     Measurement   Waist Circumference 40 inches    Hip Circumference 41 inches    Body fat 39.6 percent      Information for Trainer   Goals Reset goals for cont to exercise      Mobility and Daily Activities   I find it easy to walk up or down two or more flights of stairs. 3    I have no trouble taking out the trash. 4    I do housework such as vacuuming and dusting on my own without difficulty. 4    I can easily lift a gallon of milk (8lbs). 4    I can easily walk a mile. 2    I have no trouble reaching into high cupboards or reaching down to pick up something from the floor. 4    I do not have trouble doing out-door work such as Armed forces logistics/support/administrative officer, raking leaves, or gardening. 4      Mobility and Daily Activities   I feel younger than my age. 2    I feel independent. 4    I feel energetic. 3    I live an active life.  3    I feel strong. 3    I feel healthy. 2    I feel active as other people my age. 3      How fit and strong are you.   Fit and Strong Total Score 45            Past Medical History:  Diagnosis Date   Acute diastolic CHF (congestive heart failure) ([place]) 06/08/2021   Acute pulmonary edema (HCC)    Angina pectoris ([place]) 11/17/2019   Anxiety    Atypical chest pain 12/30/2020   CHF (congestive heart failure) ([place]) 06/08/2021   CKD (chronic kidney disease) stage 3b, GFR 30-59 ml/min 12/03/2019   Coronary artery disease    Coronary artery disease involving native coronary artery of native heart with angina pectoris ([place]) 10/03/2019   Depression 04/25/2017    Diabetes mellitus due to underlying condition with unspecified complications ([place]) 02/28/6947   Essential hypertension 09/05/2019   Ex-smoker 09/05/2019   Family history of coronary artery disease 09/05/2019   History of depression 04/15/2020   Hx of diabetes mellitus 04/15/2020   Hyperlipidemia with target LDL less than 70 11/24/2019   Hypertension    Hypothyroid    Migraine 04/25/2017   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   Thyroid disease    Past Surgical History:  Procedure Laterality Date   ABDOMINAL HYSTERECTOMY     APPENDECTOMY     CARDIAC CATHETERIZATION  11/22/2019   CHOLECYSTECTOMY  COLONOSCOPY     CORONARY ARTERY BYPASS GRAFT N/A 09/15/2019   Procedure: CORONARY ARTERY BYPASS GRAFTING (CABG) times three on pump using left internal mammary artery and right and left greater saphenous veins harvested endoscopically;  Surgeon: Gaye Pollack, MD;  Location: [facility];  Service: Open Heart Surgery;  Laterality: N/A;   CORONARY STENT INTERVENTION N/A 11/23/2019   Procedure: CORONARY STENT INTERVENTION;  Surgeon: [name], MD;  Location: [facility];  Service: Cardiovascular;  Laterality: N/A;   KNEE ARTHROSCOPY     LEFT HEART CATH AND CORONARY ANGIOGRAPHY N/A 09/14/2019   Procedure: LEFT HEART CATH AND CORONARY ANGIOGRAPHY;  Surgeon: [name], MD;  Location: [facility];  Service: Cardiovascular;  Laterality: N/A;   LEFT HEART CATH AND CORS/GRAFTS ANGIOGRAPHY N/A 11/22/2019   Procedure: LEFT HEART CATH AND CORS/GRAFTS ANGIOGRAPHY;  Surgeon: [name], MD;  Location: [facility];  Service: Cardiovascular;  Laterality: N/A;   LEFT HEART CATH AND CORS/GRAFTS ANGIOGRAPHY N/A  03/13/2020   Procedure: LEFT HEART CATH AND CORS/GRAFTS ANGIOGRAPHY;  Surgeon: Lorretta Harp, MD;  Location: [facility];  Service: Cardiovascular;  Laterality: N/A;   LEFT HEART CATH AND CORS/GRAFTS ANGIOGRAPHY N/A 12/31/2020   Procedure: LEFT HEART CATH AND CORS/GRAFTS ANGIOGRAPHY;  Surgeon: Belva Crome, MD;  Location: [facility];  Service: Cardiovascular;  Laterality: N/A;   OSTEOCHONDRAL DEFECT REPAIR/RECONSTRUCTION Left 11/29/2014   Procedure: LEFT ANKLE MEDIAL MALLEOLUS OSTEOTOMY,AUTO GRAFT FROM CALCANEOUS;  OS TIBIA DENORO GRAFTING TALUS;  Surgeon: Wylene Simmer, MD;  Location: [facility];  Service: Orthopedics;  Laterality: Left;   TEE WITHOUT CARDIOVERSION N/A 09/15/2019   Procedure: TRANSESOPHAGEAL ECHOCARDIOGRAM (TEE);  Surgeon: Gaye Pollack, MD;  Location: [facility];  Service: Open Heart Surgery;  Laterality: N/A;   TUBAL LIGATION     Social History   Tobacco Use  Smoking Status Former   Packs/day: 1.00   Years: 47.00   Pack years: 47.00   Types: Cigarettes   Quit date: 09/15/2019   Years since quitting: 2.0   Passive exposure: Past  Smokeless Tobacco Never   Still struggling with some dizziness at night-reviewed meds with her that might cause dizziness-shared concern for falling again. Encouraged her to speak with her MD about dizziness Fit testing: Cardio march: 216 to 233 Sit to stand: 11 to 10 Right bicep curl -5 lbs: 18 Struggled with single leg stand-sts may have been the shoes she had on.  Encouraged to keep up with exercise and [facility] exercises both [facility] and at [facility].  Barnett Hatter 10/15/2021, 9:44 AM

## 2021-10-19 ENCOUNTER — Other Ambulatory Visit (HOSPITAL_BASED_OUTPATIENT_CLINIC_OR_DEPARTMENT_OTHER): Payer: Self-pay

## 2021-10-20 ENCOUNTER — Other Ambulatory Visit (HOSPITAL_BASED_OUTPATIENT_CLINIC_OR_DEPARTMENT_OTHER): Payer: Self-pay

## 2021-10-21 ENCOUNTER — Other Ambulatory Visit (HOSPITAL_BASED_OUTPATIENT_CLINIC_OR_DEPARTMENT_OTHER): Payer: Self-pay

## 2021-10-22 ENCOUNTER — Encounter: Payer: Self-pay | Admitting: Cardiology

## 2021-10-23 ENCOUNTER — Other Ambulatory Visit (HOSPITAL_BASED_OUTPATIENT_CLINIC_OR_DEPARTMENT_OTHER): Payer: Self-pay

## 2021-10-23 MED ORDER — RANOLAZINE ER 500 MG PO TB12
ORAL_TABLET | ORAL | 11 refills | Status: DC
  Filled 2021-10-23: qty 60, 30d supply, fill #0
  Filled 2021-11-20: qty 60, 30d supply, fill #1
  Filled 2021-12-25: qty 60, 30d supply, fill #2

## 2021-10-24 ENCOUNTER — Other Ambulatory Visit (HOSPITAL_BASED_OUTPATIENT_CLINIC_OR_DEPARTMENT_OTHER): Payer: Self-pay

## 2021-10-27 ENCOUNTER — Other Ambulatory Visit (HOSPITAL_BASED_OUTPATIENT_CLINIC_OR_DEPARTMENT_OTHER): Payer: Self-pay

## 2021-11-03 ENCOUNTER — Other Ambulatory Visit (HOSPITAL_BASED_OUTPATIENT_CLINIC_OR_DEPARTMENT_OTHER): Payer: Self-pay

## 2021-11-19 ENCOUNTER — Other Ambulatory Visit (HOSPITAL_BASED_OUTPATIENT_CLINIC_OR_DEPARTMENT_OTHER): Payer: Self-pay

## 2021-11-19 MED ORDER — CEPHALEXIN 250 MG PO CAPS
ORAL_CAPSULE | ORAL | 0 refills | Status: DC
  Filled 2021-11-19: qty 6, 3d supply, fill #0

## 2021-11-20 ENCOUNTER — Other Ambulatory Visit (HOSPITAL_BASED_OUTPATIENT_CLINIC_OR_DEPARTMENT_OTHER): Payer: Self-pay

## 2021-11-20 MED ORDER — POTASSIUM CHLORIDE CRYS ER 20 MEQ PO TBCR
EXTENDED_RELEASE_TABLET | ORAL | 3 refills | Status: DC
  Filled 2021-11-20: qty 40, 20d supply, fill #0

## 2021-11-21 ENCOUNTER — Other Ambulatory Visit (HOSPITAL_BASED_OUTPATIENT_CLINIC_OR_DEPARTMENT_OTHER): Payer: Self-pay

## 2021-11-24 ENCOUNTER — Other Ambulatory Visit (HOSPITAL_BASED_OUTPATIENT_CLINIC_OR_DEPARTMENT_OTHER): Payer: Self-pay

## 2021-11-24 MED ORDER — FLUCONAZOLE 150 MG PO TABS
ORAL_TABLET | ORAL | 1 refills | Status: DC
  Filled 2021-11-24: qty 1, 1d supply, fill #0

## 2021-11-24 MED ORDER — DOXYCYCLINE HYCLATE 100 MG PO CAPS
100.0000 mg | ORAL_CAPSULE | Freq: Two times a day (BID) | ORAL | 0 refills | Status: DC
  Filled 2021-11-24 (×2): qty 14, 7d supply, fill #0

## 2021-11-25 ENCOUNTER — Other Ambulatory Visit (HOSPITAL_BASED_OUTPATIENT_CLINIC_OR_DEPARTMENT_OTHER): Payer: Self-pay

## 2021-11-28 ENCOUNTER — Encounter: Payer: Self-pay | Admitting: Cardiology

## 2021-12-01 ENCOUNTER — Other Ambulatory Visit (HOSPITAL_BASED_OUTPATIENT_CLINIC_OR_DEPARTMENT_OTHER): Payer: Self-pay

## 2021-12-01 ENCOUNTER — Encounter: Payer: Self-pay | Admitting: Cardiology

## 2021-12-08 ENCOUNTER — Other Ambulatory Visit (HOSPITAL_BASED_OUTPATIENT_CLINIC_OR_DEPARTMENT_OTHER): Payer: Self-pay

## 2021-12-08 MED ORDER — HYDROCODONE-ACETAMINOPHEN 5-325 MG PO TABS
ORAL_TABLET | ORAL | 0 refills | Status: DC
  Filled 2021-12-08: qty 12, 4d supply, fill #0

## 2021-12-08 MED ORDER — ACETAMINOPHEN 500 MG PO TABS
ORAL_TABLET | ORAL | 0 refills | Status: AC
  Filled 2021-12-08 – 2022-05-23 (×2): qty 100, 17d supply, fill #0

## 2021-12-08 MED ORDER — IBUPROFEN 600 MG PO TABS
ORAL_TABLET | ORAL | 0 refills | Status: DC
  Filled 2021-12-08: qty 30, 8d supply, fill #0

## 2021-12-08 MED ORDER — METHOCARBAMOL 500 MG PO TABS
ORAL_TABLET | ORAL | 0 refills | Status: DC
  Filled 2021-12-08: qty 15, 8d supply, fill #0

## 2021-12-12 ENCOUNTER — Encounter (HOSPITAL_COMMUNITY): Payer: Self-pay | Admitting: *Deleted

## 2021-12-12 ENCOUNTER — Emergency Department (HOSPITAL_COMMUNITY): Payer: Commercial Managed Care - HMO

## 2021-12-12 ENCOUNTER — Emergency Department (HOSPITAL_COMMUNITY)
Admission: EM | Admit: 2021-12-12 | Discharge: 2021-12-12 | Disposition: A | Discharge status: Home / Self Care | Payer: Commercial Managed Care - HMO | Attending: Emergency Medicine | Admitting: Emergency Medicine

## 2021-12-12 DIAGNOSIS — I5020 Unspecified systolic (congestive) heart failure: Secondary | ICD-10-CM | POA: Diagnosis not present

## 2021-12-12 DIAGNOSIS — R531 Weakness: Secondary | ICD-10-CM | POA: Diagnosis present

## 2021-12-12 DIAGNOSIS — Z7902 Long term (current) use of antithrombotics/antiplatelets: Secondary | ICD-10-CM | POA: Insufficient documentation

## 2021-12-12 DIAGNOSIS — D649 Anemia, unspecified: Secondary | ICD-10-CM | POA: Insufficient documentation

## 2021-12-12 DIAGNOSIS — Z7982 Long term (current) use of aspirin: Secondary | ICD-10-CM | POA: Diagnosis not present

## 2021-12-12 LAB — CBC
HCT: 34.9 % — ABNORMAL LOW (ref 36.0–46.0)
Hemoglobin: 10.5 g/dL — ABNORMAL LOW (ref 12.0–15.0)
MCH: 27.8 pg (ref 26.0–34.0)
MCHC: 30.1 g/dL (ref 30.0–36.0)
MCV: 92.3 fL (ref 80.0–100.0)
Platelets: 289 10*3/uL (ref 150–400)
RBC: 3.78 MIL/uL — ABNORMAL LOW (ref 3.87–5.11)
RDW: 16 % — ABNORMAL HIGH (ref 11.5–15.5)
WBC: 11.9 10*3/uL — ABNORMAL HIGH (ref 4.0–10.5)
nRBC: 0 % (ref 0.0–0.2)

## 2021-12-12 LAB — BASIC METABOLIC PANEL
Anion gap: 11 (ref 5–15)
BUN: 44 mg/dL — ABNORMAL HIGH (ref 8–23)
CO2: 26 mmol/L (ref 22–32)
Calcium: 8.8 mg/dL — ABNORMAL LOW (ref 8.9–10.3)
Chloride: 103 mmol/L (ref 98–111)
Creatinine, Ser: 2.73 mg/dL — ABNORMAL HIGH (ref 0.44–1.00)
GFR, Estimated: 19 mL/min — ABNORMAL LOW (ref >60)
Glucose, Bld: 126 mg/dL — ABNORMAL HIGH (ref 70–99)
Potassium: 3.6 mmol/L (ref 3.5–5.1)
Sodium: 140 mmol/L (ref 135–145)

## 2021-12-12 LAB — TROPONIN I (HIGH SENSITIVITY)
Troponin I (High Sensitivity): 9 ng/L (ref <18)
Troponin I (High Sensitivity): 9 ng/L (ref <18)

## 2021-12-12 LAB — BRAIN NATRIURETIC PEPTIDE: B Natriuretic Peptide: 57 pg/mL (ref 0.0–100.0)

## 2021-12-12 MED ORDER — FUROSEMIDE 10 MG/ML IJ SOLN
80.0000 mg | Freq: Once | INTRAMUSCULAR | Status: AC
  Administered 2021-12-12: 80 mg via INTRAVENOUS
  Filled 2021-12-12: qty 8

## 2021-12-12 MED ORDER — ROPINIROLE HCL 1 MG PO TABS
4.0000 mg | ORAL_TABLET | Freq: Once | ORAL | Status: AC
  Administered 2021-12-12: 4 mg via ORAL
  Filled 2021-12-12: qty 4

## 2021-12-12 NOTE — Discharge Instructions (Signed)
Increase your Lasix so you are taking 60 mg twice a day and follow-up with your doctors at the beginning of the week.  Return if any problem ?

## 2021-12-12 NOTE — ED Triage Notes (Signed)
Swelling of both legs for the past 2-3 days, states she has a history of CHF . ?

## 2021-12-12 NOTE — ED Provider Notes (Signed)
?[facility] ?Provider Note ? ? ?CSN: [id number] ?Arrival date & time: 12/12/21  1711 ? ?  ? ?History ? ?Chief Complaint  ?Patient presents with  ? Shortness of Breath  ? ? ?Meghan Terry is a 61 y.o. female. ? ?Patient complains of swelling to her legs.  He has a history of congestive heart failure.  No real shortness of breath ? ?The history is provided by the patient and medical records. No language interpreter was used.  ?Weakness ?Severity:  Mild ?Onset quality:  Gradual ?Timing:  Intermittent ?Progression:  Waxing and waning ?Chronicity:  Recurrent ?Context: not alcohol use   ?Relieved by:  Nothing ?Worsened by:  Nothing ?Ineffective treatments:  None tried ?Associated symptoms: no abdominal pain, no chest pain, no cough, no diarrhea, no frequency, no headaches and no seizures   ? ?  ? ?Home Medications ?Prior to Admission medications   ?Medication Sig Start Date End Date Taking? Authorizing Provider  ?aspirin EC 81 MG tablet Take 1 tablet (81 mg total) by mouth daily. 09/05/19  Yes Revankar, Reita Cliche, MD  ?buPROPion (WELLBUTRIN XL) 300 MG 24 hr tablet Take 1 tablet by mouth daily 08/02/21  Yes   ?cholecalciferol (VITAMIN D) 25 MCG (1000 UNIT) tablet Take 2 tablets by mouth every day. ?Patient taking differently: Take 1,000 Units by mouth 2 (two) times daily. 05/27/21  Yes   ?clonazePAM (KLONOPIN) 0.5 MG tablet Take 1 tablet (0.5 mg total) by mouth at bedtime. 09/08/21  Yes   ?clopidogrel (PLAVIX) 75 MG tablet TAKE 1 TABLET BY MOUTH DAILY 05/07/21  Yes Revankar, Reita Cliche, MD  ?dapagliflozin propanediol (FARXIGA) 10 MG TABS tablet Take 1 tablet (10 mg total) by mouth daily before breakfast. 09/25/21  Yes Revankar, Reita Cliche, MD  ?furosemide (LASIX) 40 MG tablet Take 1 tablet (40 mg total) by mouth daily as needed for fluid or edema. Take extra tab if weight is 185 lbs or greater 07/03/21  Yes Revankar, Reita Cliche, MD  ?glimepiride (AMARYL) 1 MG tablet Take 1 tablet (1 mg) by mouth once daily 09/25/21  Yes    ?HYDROcodone-acetaminophen (NORCO/VICODIN) 5-325 MG tablet Take 1 tablet by mouth every 8 hours as needed for pain 12/08/21  Yes   ?lactulose, encephalopathy, (CHRONULAC) 10 GM/15ML SOLN Take 30 ML by mouth three times a week as needed for constipation 02/20/21  Yes   ?levothyroxine (SYNTHROID) 50 MCG tablet TAKE 1 TABLET BY MOUTH ONCE DAILY ON AN EMPTY STOMACH 30 MINUTES BEFORE BREAKFAST ON SAT AND SUNDAY 07/21/21  Yes   ?levothyroxine (SYNTHROID) 75 MCG tablet TAKE 1 TABLET BY MOUTH ONCE DAILY MONDAY THRU FRIDAY 06/13/21  Yes   ?metoprolol succinate (TOPROL-XL) 50 MG 24 hr tablet Take 1 tablet (50 mg total) by mouth at bedtime. Take with or immediately following a meal. 05/08/21  Yes Revankar, Reita Cliche, MD  ?nitroGLYCERIN (NITROSTAT) 0.4 MG SL tablet Place 1 tablet (0.4 mg total) under the tongue every 5 (five) minutes as needed for chest pain. 02/05/21  Yes Revankar, Reita Cliche, MD  ?ondansetron (ZOFRAN) 4 MG tablet take 1 tablet (4 mg) by mouth 2 times per day as needed for nausea 05/06/21  Yes   ?ondansetron (ZOFRAN-ODT) 4 MG disintegrating tablet Dissolve 1 tablet under the tongue every 6 hours as needed for nausea & vomiting 09/15/21  Yes   ?potassium chloride SA (KLOR-CON M) 20 MEQ tablet Take 1 tablet by mouth 2 times daily for 5 days then decrease to 2 tablets daily ?Patient taking  differently: Take 20 mEq by mouth daily. 11/20/21  Yes   ?promethazine (PHENERGAN) 25 MG tablet take 1 tablet (25 mg) by mouth every 6 hours as needed for 30 days 07/21/21  Yes   ?ranolazine (RANEXA) 500 MG 12 hr tablet Take 1 tablet by mouth 2 times daily 10/23/21  Yes Revankar, Reita Cliche, MD  ?rOPINIRole (REQUIP) 1 MG tablet Take 1 tablet by mouth 3 times daily ?Patient taking differently: Take 1 mg by mouth See admin instructions. Take one tablet (1 mg) by mouth three times daily - morning, afternoon and evening (take 4 mg tablet at bedtime) 04/21/21  Yes   ?rOPINIRole (REQUIP) 4 MG tablet TAKE 1 TABLET BY MOUTH 1-3 HOURS BEFORE BEDTIME  09/16/21  Yes   ?rosuvastatin (CRESTOR) 40 MG tablet take 1 tablet (40 mg) by mouth once daily for 90 days 07/21/21  Yes   ?topiramate (TOPAMAX) 100 MG tablet TAKE 1 TABLET BY MOUTH TWICE DAILY. 06/20/21  Yes   ?traMADol (ULTRAM) 50 MG tablet take 1 tablet (50 mg) by mouth every 12 hours as needed for pain 07/21/21  Yes   ?traZODone (DESYREL) 150 MG tablet TAKE 1 TABLET BY MOUTH ONCE DAILY 05/26/21  Yes   ?acetaminophen (TYLENOL) 500 MG tablet Take 2 tablets by mouth 3 times daily as needed for pain 12/08/21     ?cephALEXin (KEFLEX) 250 MG capsule Take 1 capsule by mouth twice a day ?Patient not taking: Reported on 12/12/2021 11/19/21     ?doxycycline (VIBRAMYCIN) 100 MG capsule Take 1 capsule (100 mg total) by mouth 2 (two) times daily. ?Patient not taking: Reported on 12/12/2021 11/24/21     ?fluconazole (DIFLUCAN) 150 MG tablet Take 1 (one) tablet by mouth as a 1 time dose ?Patient not taking: Reported on 12/12/2021 11/24/21     ?ibuprofen (ADVIL) 600 MG tablet Take 1 tablet by mouth every 6 hours as needed for pain ?Patient not taking: Reported on 12/12/2021 12/08/21     ?isosorbide mononitrate (IMDUR) 60 MG 24 hr tablet Take 1 tablet (60 mg total) by mouth daily. ?Patient not taking: Reported on 09/24/2021 05/08/21   Revankar, Reita Cliche, MD  ?methocarbamol (ROBAXIN) 500 MG tablet Take 1 tablet by mouth 2 times daily as needed for muscle spasms ?Patient not taking: Reported on 12/12/2021 12/08/21     ?metolazone (ZAROXOLYN) 2.5 MG tablet take 1 tablet (2.5 mg) by mouth once daily as needed for edema ?Patient not taking: Reported on 12/12/2021 07/21/21     ?nitrofurantoin, macrocrystal-monohydrate, (MACROBID) 100 MG capsule take 1 capsule (100 mg) by mouth every 12 hours with food for 7 days ?Patient not taking: Reported on 12/12/2021 10/07/21     ?oxyCODONE (OXY IR/ROXICODONE) 5 MG immediate release tablet Take 1 tablet by mouth every 4 hours as needed for pain ?Patient not taking: Reported on 12/12/2021 09/15/21     ?potassium  chloride (KLOR-CON) 10 MEQ tablet Take 1 tablet (10 mEq total) by mouth daily. 05/14/21 09/24/21  Tobb, Godfrey Pick, DO  ?potassium chloride SA (KLOR-CON M20) 20 MEQ tablet Take 1 tablet by mouth twice daily for 5 days then decrease to 1 tablet daily ?Patient not taking: Reported on 12/12/2021 08/20/21     ?   ? ?Allergies    ?Carbidopa-levodopa, Prednisone, and Venlafaxine   ? ?Review of Systems   ?Review of Systems  ?Constitutional:  Negative for appetite change and fatigue.  ?HENT:  Negative for congestion, ear discharge and sinus pressure.   ?Eyes:  Negative for discharge.  ?  Respiratory:  Negative for cough.   ?Cardiovascular:  Negative for chest pain.  ?Gastrointestinal:  Negative for abdominal pain and diarrhea.  ?Genitourinary:  Negative for frequency and hematuria.  ?Musculoskeletal:  Negative for back pain.  ?     Swelling in leg  ?Skin:  Negative for rash.  ?Neurological:  Positive for weakness. Negative for seizures and headaches.  ?Psychiatric/Behavioral:  Negative for hallucinations.   ? ?Physical Exam ?Updated Vital Signs ?BP (!) 98/53   Pulse 84   Temp 98.1 ?F (36.7 ?C) (Oral)   Resp 17   Ht 5\' 3"  (1.6 m)   Wt 80.3 kg   SpO2 97%   BMI 31.35 kg/m?  ?Physical Exam ?Vitals and nursing note reviewed.  ?Constitutional:   ?   Appearance: She is well-developed.  ?HENT:  ?   Head: Normocephalic.  ?   Mouth/Throat:  ?   Mouth: Mucous membranes are moist.  ?Eyes:  ?   General: No scleral icterus. ?   Conjunctiva/sclera: Conjunctivae normal.  ?Neck:  ?   Thyroid: No thyromegaly.  ?Cardiovascular:  ?   Rate and Rhythm: Normal rate and regular rhythm.  ?   Heart sounds: No murmur heard. ?  No friction rub. No gallop.  ?Pulmonary:  ?   Breath sounds: No stridor. No wheezing or rales.  ?Chest:  ?   Chest wall: No tenderness.  ?Abdominal:  ?   General: There is no distension.  ?   Tenderness: There is no abdominal tenderness. There is no rebound.  ?Musculoskeletal:     ?   General: Normal range of motion.  ?    Cervical back: Neck supple.  ?   Comments: 2+ edema lower extremity  ?Lymphadenopathy:  ?   Cervical: No cervical adenopathy.  ?Skin: ?   Findings: No erythema or rash.  ?Neurological:  ?   Mental Status: She is alert

## 2021-12-15 ENCOUNTER — Other Ambulatory Visit (HOSPITAL_BASED_OUTPATIENT_CLINIC_OR_DEPARTMENT_OTHER): Payer: Self-pay

## 2021-12-15 MED ORDER — TORSEMIDE 20 MG PO TABS
40.0000 mg | ORAL_TABLET | Freq: Every day | ORAL | 0 refills | Status: DC
  Filled 2021-12-15: qty 14, 7d supply, fill #0

## 2021-12-23 ENCOUNTER — Other Ambulatory Visit (HOSPITAL_BASED_OUTPATIENT_CLINIC_OR_DEPARTMENT_OTHER): Payer: Self-pay

## 2021-12-23 MED ORDER — TORSEMIDE 100 MG PO TABS
ORAL_TABLET | ORAL | 0 refills | Status: DC
  Filled 2021-12-23: qty 7, 7d supply, fill #0

## 2021-12-24 ENCOUNTER — Other Ambulatory Visit (HOSPITAL_BASED_OUTPATIENT_CLINIC_OR_DEPARTMENT_OTHER): Payer: Self-pay

## 2021-12-25 ENCOUNTER — Other Ambulatory Visit (HOSPITAL_BASED_OUTPATIENT_CLINIC_OR_DEPARTMENT_OTHER): Payer: Self-pay

## 2021-12-25 ENCOUNTER — Other Ambulatory Visit: Payer: Self-pay

## 2021-12-25 ENCOUNTER — Telehealth: Payer: Self-pay | Admitting: Cardiology

## 2021-12-25 MED ORDER — RANOLAZINE ER 500 MG PO TB12
ORAL_TABLET | ORAL | 3 refills | Status: DC
  Filled 2021-12-25: qty 180, 90d supply, fill #0
  Filled 2022-04-23: qty 180, 90d supply, fill #1
  Filled 2022-05-12 – 2022-07-06 (×3): qty 180, 90d supply, fill #2
  Filled 2022-07-14 – 2022-10-14 (×2): qty 180, 90d supply, fill #3

## 2021-12-25 NOTE — Telephone Encounter (Signed)
Pt c/o medication issue: ? ?1. Name of Medication: ranolazine (RANEXA) 500 MG 12 hr tablet ? ?2. How are you currently taking this medication (dosage and times per day)? As written ? ?3. Are you having a reaction (difficulty breathing--STAT)? No  ? ?4. What is your medication issue? Needs new 90 day prescription sent to [facility] ?

## 2021-12-25 NOTE — Telephone Encounter (Signed)
RX sent

## 2021-12-26 ENCOUNTER — Other Ambulatory Visit (HOSPITAL_BASED_OUTPATIENT_CLINIC_OR_DEPARTMENT_OTHER): Payer: Self-pay

## 2021-12-28 ENCOUNTER — Other Ambulatory Visit: Payer: Self-pay | Admitting: Cardiology

## 2021-12-28 ENCOUNTER — Other Ambulatory Visit (HOSPITAL_BASED_OUTPATIENT_CLINIC_OR_DEPARTMENT_OTHER): Payer: Self-pay

## 2021-12-28 DIAGNOSIS — I517 Cardiomegaly: Secondary | ICD-10-CM

## 2021-12-29 ENCOUNTER — Other Ambulatory Visit (HOSPITAL_BASED_OUTPATIENT_CLINIC_OR_DEPARTMENT_OTHER): Payer: Self-pay

## 2021-12-29 DIAGNOSIS — I2510 Atherosclerotic heart disease of native coronary artery without angina pectoris: Secondary | ICD-10-CM

## 2021-12-29 DIAGNOSIS — E785 Hyperlipidemia, unspecified: Secondary | ICD-10-CM

## 2021-12-29 DIAGNOSIS — E139 Other specified diabetes mellitus without complications: Secondary | ICD-10-CM

## 2021-12-29 DIAGNOSIS — I130 Hypertensive heart and chronic kidney disease with heart failure and stage 1 through stage 4 chronic kidney disease, or unspecified chronic kidney disease: Secondary | ICD-10-CM

## 2021-12-29 DIAGNOSIS — R600 Localized edema: Secondary | ICD-10-CM

## 2021-12-29 MED ORDER — NITROGLYCERIN 0.4 MG SL SUBL
0.4000 mg | SUBLINGUAL_TABLET | SUBLINGUAL | 7 refills | Status: DC | PRN
  Filled 2021-12-29: qty 25, 7d supply, fill #0
  Filled 2022-07-06: qty 25, 7d supply, fill #1
  Filled 2022-08-27: qty 25, 7d supply, fill #2
  Filled 2022-09-17: qty 25, 7d supply, fill #3

## 2021-12-29 MED ORDER — CLONAZEPAM 0.5 MG PO TABS
ORAL_TABLET | ORAL | 2 refills | Status: DC
  Filled 2021-12-29: qty 30, 30d supply, fill #0
  Filled 2022-01-31: qty 30, 30d supply, fill #1

## 2021-12-30 ENCOUNTER — Other Ambulatory Visit (HOSPITAL_BASED_OUTPATIENT_CLINIC_OR_DEPARTMENT_OTHER): Payer: Self-pay

## 2021-12-30 DIAGNOSIS — I2510 Atherosclerotic heart disease of native coronary artery without angina pectoris: Secondary | ICD-10-CM | POA: Diagnosis not present

## 2021-12-30 DIAGNOSIS — I130 Hypertensive heart and chronic kidney disease with heart failure and stage 1 through stage 4 chronic kidney disease, or unspecified chronic kidney disease: Secondary | ICD-10-CM | POA: Diagnosis not present

## 2021-12-30 DIAGNOSIS — R600 Localized edema: Secondary | ICD-10-CM | POA: Diagnosis not present

## 2021-12-30 DIAGNOSIS — E139 Other specified diabetes mellitus without complications: Secondary | ICD-10-CM | POA: Diagnosis not present

## 2021-12-30 MED ORDER — GABAPENTIN 100 MG PO CAPS
100.0000 mg | ORAL_CAPSULE | Freq: Every day | ORAL | 0 refills | Status: DC
  Filled 2021-12-30: qty 90, 90d supply, fill #0

## 2022-01-01 ENCOUNTER — Other Ambulatory Visit (HOSPITAL_BASED_OUTPATIENT_CLINIC_OR_DEPARTMENT_OTHER): Payer: Self-pay

## 2022-01-01 MED ORDER — TRAMADOL HCL 50 MG PO TABS
ORAL_TABLET | ORAL | 3 refills | Status: DC
  Filled 2022-02-02: qty 30, 15d supply, fill #0
  Filled 2022-03-23: qty 30, 15d supply, fill #1
  Filled 2022-04-23: qty 30, 15d supply, fill #2

## 2022-01-06 ENCOUNTER — Other Ambulatory Visit (HOSPITAL_BASED_OUTPATIENT_CLINIC_OR_DEPARTMENT_OTHER): Payer: Self-pay

## 2022-01-07 ENCOUNTER — Other Ambulatory Visit (HOSPITAL_BASED_OUTPATIENT_CLINIC_OR_DEPARTMENT_OTHER): Payer: Self-pay

## 2022-01-07 MED ORDER — CLONAZEPAM 0.5 MG PO TABS
0.5000 mg | ORAL_TABLET | Freq: Every day | ORAL | 2 refills | Status: DC
  Filled 2022-05-12 – 2022-05-23 (×2): qty 30, 30d supply, fill #0
  Filled 2022-06-28: qty 30, 30d supply, fill #1
  Filled 2022-07-06: qty 30, 30d supply, fill #2

## 2022-01-12 ENCOUNTER — Other Ambulatory Visit (HOSPITAL_BASED_OUTPATIENT_CLINIC_OR_DEPARTMENT_OTHER): Payer: Self-pay

## 2022-01-12 MED ORDER — GABAPENTIN 100 MG PO CAPS
ORAL_CAPSULE | ORAL | 0 refills | Status: DC
  Filled 2022-01-12: qty 180, 90d supply, fill #0
  Filled 2022-01-12: qty 180, fill #0
  Filled 2022-01-12 – 2022-04-20 (×2): qty 180, 90d supply, fill #0

## 2022-01-12 MED ORDER — FUROSEMIDE 40 MG PO TABS
ORAL_TABLET | ORAL | 0 refills | Status: DC
  Filled 2022-01-12: qty 135, 90d supply, fill #0

## 2022-01-15 ENCOUNTER — Other Ambulatory Visit (HOSPITAL_BASED_OUTPATIENT_CLINIC_OR_DEPARTMENT_OTHER): Payer: Self-pay

## 2022-01-15 MED ORDER — CETIRIZINE HCL 10 MG PO TABS
ORAL_TABLET | ORAL | 0 refills | Status: DC
  Filled 2022-01-15: qty 100, 100d supply, fill #0

## 2022-01-23 ENCOUNTER — Other Ambulatory Visit (HOSPITAL_BASED_OUTPATIENT_CLINIC_OR_DEPARTMENT_OTHER): Payer: Self-pay

## 2022-01-31 ENCOUNTER — Other Ambulatory Visit (HOSPITAL_BASED_OUTPATIENT_CLINIC_OR_DEPARTMENT_OTHER): Payer: Self-pay

## 2022-02-02 ENCOUNTER — Other Ambulatory Visit (HOSPITAL_BASED_OUTPATIENT_CLINIC_OR_DEPARTMENT_OTHER): Payer: Self-pay

## 2022-02-02 MED ORDER — GABAPENTIN 800 MG PO TABS
ORAL_TABLET | ORAL | 0 refills | Status: DC
  Filled 2022-02-02: qty 90, 90d supply, fill #0

## 2022-02-03 ENCOUNTER — Ambulatory Visit: Payer: 59 | Admitting: Cardiology

## 2022-02-11 ENCOUNTER — Encounter: Payer: Self-pay | Admitting: Cardiology

## 2022-02-11 ENCOUNTER — Ambulatory Visit (INDEPENDENT_AMBULATORY_CARE_PROVIDER_SITE_OTHER): Payer: Commercial Managed Care - HMO | Admitting: Cardiology

## 2022-02-11 VITALS — BP 128/58 | HR 82 | Ht 63.0 in | Wt 185.8 lb

## 2022-02-11 DIAGNOSIS — Z951 Presence of aortocoronary bypass graft: Secondary | ICD-10-CM

## 2022-02-11 DIAGNOSIS — E785 Hyperlipidemia, unspecified: Secondary | ICD-10-CM | POA: Diagnosis not present

## 2022-02-11 DIAGNOSIS — I25119 Atherosclerotic heart disease of native coronary artery with unspecified angina pectoris: Secondary | ICD-10-CM | POA: Diagnosis not present

## 2022-02-11 DIAGNOSIS — N183 Chronic kidney disease, stage 3 unspecified: Secondary | ICD-10-CM

## 2022-02-11 DIAGNOSIS — I10 Essential (primary) hypertension: Secondary | ICD-10-CM

## 2022-02-11 DIAGNOSIS — E088 Diabetes mellitus due to underlying condition with unspecified complications: Secondary | ICD-10-CM

## 2022-02-11 NOTE — Progress Notes (Signed)
?Cardiology Office Note:   ? ?Date:  02/11/2022  ? ?ID:  Meghan Terry, DOB Dec 16, 1960, MRN [id number] ? ?PCP:  Garwin Brothers, MD  ?Cardiologist:  Jenean Lindau, MD  ? ?Referring MD: Garwin Brothers, MD  ? ? ?ASSESSMENT:   ? ?1. Coronary artery disease involving native coronary artery of native heart with angina pectoris ([facility])   ?2. Essential hypertension   ?3. Hyperlipidemia with target LDL less than 70   ?4. Stage 3 chronic kidney disease, unspecified whether stage 3a or 3b CKD ([facility])   ?5. Diabetes mellitus due to underlying condition with unspecified complications ([place])   ?6. S/P CABG x 3   ? ?PLAN:   ? ?In order of problems listed above: ? ?Coronary artery disease post CABG surgery: Secondary prevention stressed with the patient.  Importance of compliance with diet medication stressed and she vocalized understanding.  She was advised to walk at least half an hour a day 5 days a week and she promises to do so. ?Essential hypertension: Blood pressure stable and diet was emphasized.  Lifestyle modification urged. ?Mixed dyslipidemia: Lipids followed by primary care and I reviewed them from [facility] sheet.  Diet emphasized.  I told him to exercise on a regular basis and he promises to do so.  She is not at goal and she is going to do better with diet and exercise and follow-up with primary care for follow-up lab works. ?Diabetes mellitus and obesity: Weight reduction stressed diet emphasized. ?Renal insufficiency: Stable and followed by primary care.  This limits the options we have for evaluation and most certainly in the future she will have to be managed aggressively with medical therapy so that we preferably not use radiocontrast dye as much as possible to the best of her ability for renal protection. ?Patient will be seen in follow-up appointment in 6 months or earlier if the patient has any concerns ? ? ? ?Medication Adjustments/Labs and Tests Ordered: ?Current medicines are reviewed at length with the patient today.   Concerns regarding medicines are outlined above.  ?No orders of the defined types were placed in this encounter. ? ?No orders of the defined types were placed in this encounter. ? ? ? ?No chief complaint on file. ?  ? ?History of Present Illness:   ? ?Meghan Terry is a 61 y.o. female.  Patient has past medical history of coronary artery disease post CABG surgery, essential hypertension, dyslipidemia, diabetes mellitus and obesity.  She denies any problems at this time and takes care of activities of daily living.  No chest pain orthopnea or PND.  At the time of my evaluation, the patient is alert awake oriented and in no distress. ? ?Past Medical History:  ?Diagnosis Date  ? Acute diastolic CHF (congestive heart failure) ([place]) 06/08/2021  ? Acute pulmonary edema ([facility])   ? Angina pectoris ([place]) 11/17/2019  ? Anxiety   ? Atypical chest pain 12/30/2020  ? CHF (congestive heart failure) ([place]) 06/08/2021  ? CKD (chronic kidney disease) stage 3b, GFR 30-59 ml/min 12/03/2019  ? Coronary artery disease   ? Coronary artery disease involving native coronary artery of native heart with angina pectoris ([place]) 10/03/2019  ? Depression 04/25/2017  ? Diabetes mellitus due to underlying condition with unspecified complications ([place]) 62/05/5283  ? Essential hypertension 09/05/2019  ? Ex-smoker 09/05/2019  ? Family history of coronary artery disease 09/05/2019  ? History of depression 04/15/2020  ? Hx of diabetes mellitus 04/15/2020  ? Hyperlipidemia with target LDL  less than 70 11/24/2019  ? Hypertension   ? Hypothyroid   ? Migraine 04/25/2017  ? 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 Presence of drug coated stent in right coronary artery 11/24/2019  ? Pyelonephritis 05/20/2016  ? Restless legs syndrome 04/15/2020  ? Formatting of this note might be different from the original. 30 years  Controlled with requip  ? RLS (restless legs syndrome)   ? 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  ? Thyroid disease   ? ? ?Past Surgical History:  ?Procedure Laterality Date  ? ABDOMINAL  HYSTERECTOMY    ? APPENDECTOMY    ? CARDIAC CATHETERIZATION  11/22/2019  ? CHOLECYSTECTOMY    ? COLONOSCOPY    ? CORONARY ARTERY BYPASS GRAFT N/A 09/15/2019  ? Procedure: CORONARY ARTERY BYPASS GRAFTING (CABG) times three on pump using left internal mammary artery and right and left greater saphenous veins harvested endoscopically;  Surgeon: Gaye Pollack, MD;  Location: [facility];  Service: Open Heart Surgery;  Laterality: N/A;  ? CORONARY STENT INTERVENTION N/A 11/23/2019  ? Procedure: CORONARY STENT INTERVENTION;  Surgeon: [name], MD;  Location: [facility];  Service: Cardiovascular;  Laterality: N/A;  ? KNEE ARTHROSCOPY    ? LEFT HEART CATH AND CORONARY ANGIOGRAPHY N/A 09/14/2019  ? Procedure: LEFT HEART CATH AND CORONARY ANGIOGRAPHY;  Surgeon: [name], MD;  Location: [facility];  Service: Cardiovascular;  Laterality: N/A;  ? LEFT HEART CATH AND CORS/GRAFTS ANGIOGRAPHY N/A 11/22/2019  ? Procedure: LEFT HEART CATH AND CORS/GRAFTS ANGIOGRAPHY;  Surgeon: [name], MD;  Location: [facility];  Service: Cardiovascular;  Laterality: N/A;  ? LEFT HEART CATH AND CORS/GRAFTS ANGIOGRAPHY N/A 03/13/2020  ? Procedure: LEFT HEART CATH AND CORS/GRAFTS ANGIOGRAPHY;  Surgeon: Lorretta Harp, MD;  Location: [facility];  Service: Cardiovascular;  Laterality: N/A;  ? LEFT HEART CATH AND CORS/GRAFTS ANGIOGRAPHY N/A 12/31/2020  ? Procedure: LEFT HEART CATH AND CORS/GRAFTS ANGIOGRAPHY;  Surgeon: Belva Crome, MD;  Location: [facility];  Service: Cardiovascular;  Laterality: N/A;  ? OSTEOCHONDRAL DEFECT REPAIR/RECONSTRUCTION Left 11/29/2014  ? Procedure: LEFT ANKLE MEDIAL MALLEOLUS OSTEOTOMY,AUTO GRAFT FROM CALCANEOUS;  OS TIBIA DENORO GRAFTING TALUS;  Surgeon: Wylene Simmer, MD;  Location: [facility];  Service: Orthopedics;  Laterality: Left;  ? TEE WITHOUT CARDIOVERSION N/A 09/15/2019  ? Procedure: TRANSESOPHAGEAL ECHOCARDIOGRAM (TEE);  Surgeon: Gaye Pollack, MD;  Location: [facility];  Service: Open Heart Surgery;  Laterality: N/A;  ? TUBAL LIGATION    ? ? ?Current Medications: ?Current Meds  ?Medication Sig  ? acetaminophen (TYLENOL) 500 MG tablet Take 2 tablets by mouth 3 times daily as needed for pain  ? aspirin EC 81 MG tablet Take 1 tablet (81 mg total) by mouth daily.  ? buPROPion (WELLBUTRIN XL) 300 MG 24 hr tablet Take 1 tablet by mouth daily  ? cholecalciferol (VITAMIN D) 25 MCG (1000 UNIT) tablet Take 2 tablets by mouth every day. (Patient taking differently: Take 1,000 Units by mouth 2 (two) times daily.)  ? clonazePAM (KLONOPIN) 0.5 MG tablet Take 1 tablet (0.5 mg total) by mouth at bedtime.  ? clonazePAM (KLONOPIN) 0.5 MG tablet Take 1 tablet (0.5 mg) by mouth at bedtime  ? clopidogrel (PLAVIX) 75 MG tablet TAKE 1 TABLET BY MOUTH DAILY  ? dapagliflozin propanediol (FARXIGA) 10 MG TABS tablet Take 1 tablet (10 mg total) by mouth daily before breakfast.  ? doxycycline (VIBRAMYCIN) 100 MG capsule Take 1 capsule (  100 mg total) by mouth 2 (two) times daily.  ? fluconazole (DIFLUCAN) 150 MG tablet Take 1 (one) tablet by mouth as a 1 time dose  ? furosemide (LASIX) 40 MG tablet take 1&1/2 tablets (60 mg) by mouth once daily. May take an extra dose if weight gain on 3lbs overnight  ? gabapentin (NEURONTIN) 100 MG capsule Take 2 capsules by mouth at bedtime  ? gabapentin (NEURONTIN) 800 MG tablet Take 1 tablet by mouth every night at bedtime for Restless Leg Syndrome  ? glimepiride (AMARYL) 1 MG tablet Take 1 tablet (1 mg) by mouth once daily  ? ibuprofen (ADVIL) 600 MG tablet Take 1 tablet by mouth every 6 hours as needed for pain  ? levothyroxine (SYNTHROID) 50 MCG tablet TAKE 1 TABLET BY MOUTH ONCE DAILY ON AN EMPTY STOMACH 30 MINUTES BEFORE BREAKFAST ON SAT AND SUNDAY  ? levothyroxine (SYNTHROID) 75 MCG tablet TAKE 1 TABLET BY MOUTH ONCE DAILY MONDAY THRU FRIDAY  ? metoprolol succinate (TOPROL-XL) 50 MG 24 hr tablet Take 1 tablet (50 mg total) by mouth at  bedtime. Take with or immediately following a meal.  ? nitroGLYCERIN (NITROSTAT) 0.4 MG SL tablet Place 1 tablet (0.4 mg total) under the tongue every 5 (five) minutes as needed for chest pain.  ? ondansetron (ZOFRA

## 2022-02-11 NOTE — Patient Instructions (Signed)

## 2022-02-16 ENCOUNTER — Other Ambulatory Visit (HOSPITAL_BASED_OUTPATIENT_CLINIC_OR_DEPARTMENT_OTHER): Payer: Self-pay

## 2022-02-16 ENCOUNTER — Other Ambulatory Visit: Payer: Self-pay | Admitting: Cardiology

## 2022-02-16 MED ORDER — METOPROLOL SUCCINATE ER 50 MG PO TB24
50.0000 mg | ORAL_TABLET | Freq: Every day | ORAL | 3 refills | Status: DC
  Filled 2022-02-16: qty 90, 90d supply, fill #0
  Filled 2022-05-12: qty 90, 90d supply, fill #1
  Filled 2022-07-06 – 2022-08-10 (×3): qty 90, 90d supply, fill #2

## 2022-02-16 MED ORDER — FUROSEMIDE 40 MG PO TABS
40.0000 mg | ORAL_TABLET | Freq: Two times a day (BID) | ORAL | 6 refills | Status: DC
  Filled 2022-02-16: qty 60, 30d supply, fill #0

## 2022-03-20 ENCOUNTER — Other Ambulatory Visit (HOSPITAL_BASED_OUTPATIENT_CLINIC_OR_DEPARTMENT_OTHER): Payer: Self-pay

## 2022-03-23 ENCOUNTER — Other Ambulatory Visit (HOSPITAL_BASED_OUTPATIENT_CLINIC_OR_DEPARTMENT_OTHER): Payer: Self-pay

## 2022-03-23 MED ORDER — CLONAZEPAM 0.5 MG PO TABS
ORAL_TABLET | ORAL | 2 refills | Status: DC
  Filled 2022-03-23: qty 30, 30d supply, fill #0
  Filled 2022-04-23: qty 30, 30d supply, fill #1

## 2022-03-23 MED ORDER — OZEMPIC (0.25 OR 0.5 MG/DOSE) 2 MG/3ML ~~LOC~~ SOPN
PEN_INJECTOR | SUBCUTANEOUS | 0 refills | Status: DC
  Filled 2022-03-23: qty 3, 42d supply, fill #0

## 2022-03-23 MED ORDER — POTASSIUM CHLORIDE ER 20 MEQ PO TBCR
EXTENDED_RELEASE_TABLET | ORAL | 3 refills | Status: DC
  Filled 2022-03-23: qty 90, 90d supply, fill #0

## 2022-03-23 MED ORDER — GLIMEPIRIDE 1 MG PO TABS
ORAL_TABLET | ORAL | 3 refills | Status: DC
  Filled 2022-03-23: qty 30, 30d supply, fill #0
  Filled 2022-05-12: qty 30, 30d supply, fill #1
  Filled 2022-06-16: qty 30, 30d supply, fill #2
  Filled 2022-07-06: qty 30, 30d supply, fill #3

## 2022-03-30 ENCOUNTER — Other Ambulatory Visit: Payer: Self-pay | Admitting: Cardiology

## 2022-03-30 ENCOUNTER — Other Ambulatory Visit (HOSPITAL_BASED_OUTPATIENT_CLINIC_OR_DEPARTMENT_OTHER): Payer: Self-pay

## 2022-03-30 MED ORDER — CLOPIDOGREL BISULFATE 75 MG PO TABS
ORAL_TABLET | ORAL | 2 refills | Status: AC
  Filled 2022-03-30: qty 90, 90d supply, fill #0
  Filled 2022-05-12 – 2022-06-28 (×2): qty 90, 90d supply, fill #1
  Filled 2022-07-06 – 2022-09-24 (×3): qty 90, 90d supply, fill #2

## 2022-03-30 MED ORDER — TRAMADOL HCL 50 MG PO TABS
ORAL_TABLET | ORAL | 3 refills | Status: DC
  Filled 2022-05-12: qty 30, 15d supply, fill #0
  Filled 2022-05-23: qty 30, 15d supply, fill #1
  Filled 2022-06-16: qty 30, 15d supply, fill #2
  Filled 2022-06-28: qty 30, 15d supply, fill #3

## 2022-04-09 ENCOUNTER — Encounter (HOSPITAL_COMMUNITY): Payer: Self-pay | Admitting: Emergency Medicine

## 2022-04-09 ENCOUNTER — Emergency Department (HOSPITAL_COMMUNITY)
Admission: EM | Admit: 2022-04-09 | Discharge: 2022-04-09 | Disposition: A | Discharge status: Home / Self Care | Payer: Commercial Managed Care - HMO | Attending: Emergency Medicine | Admitting: Emergency Medicine

## 2022-04-09 ENCOUNTER — Emergency Department (HOSPITAL_COMMUNITY): Payer: Commercial Managed Care - HMO

## 2022-04-09 DIAGNOSIS — Z7902 Long term (current) use of antithrombotics/antiplatelets: Secondary | ICD-10-CM | POA: Insufficient documentation

## 2022-04-09 DIAGNOSIS — R0602 Shortness of breath: Secondary | ICD-10-CM | POA: Diagnosis present

## 2022-04-09 DIAGNOSIS — I509 Heart failure, unspecified: Secondary | ICD-10-CM

## 2022-04-09 DIAGNOSIS — E119 Type 2 diabetes mellitus without complications: Secondary | ICD-10-CM | POA: Diagnosis not present

## 2022-04-09 DIAGNOSIS — I5033 Acute on chronic diastolic (congestive) heart failure: Secondary | ICD-10-CM | POA: Insufficient documentation

## 2022-04-09 DIAGNOSIS — Z7982 Long term (current) use of aspirin: Secondary | ICD-10-CM | POA: Insufficient documentation

## 2022-04-09 DIAGNOSIS — Z7984 Long term (current) use of oral hypoglycemic drugs: Secondary | ICD-10-CM | POA: Insufficient documentation

## 2022-04-09 LAB — CBC WITH DIFFERENTIAL/PLATELET
Abs Immature Granulocytes: 0.09 10*3/uL — ABNORMAL HIGH (ref 0.00–0.07)
Basophils Absolute: 0.1 10*3/uL (ref 0.0–0.1)
Basophils Relative: 1 %
Eosinophils Absolute: 0.5 10*3/uL (ref 0.0–0.5)
Eosinophils Relative: 6 %
HCT: 32.7 % — ABNORMAL LOW (ref 36.0–46.0)
Hemoglobin: 10 g/dL — ABNORMAL LOW (ref 12.0–15.0)
Immature Granulocytes: 1 %
Lymphocytes Relative: 18 %
Lymphs Abs: 1.6 10*3/uL (ref 0.7–4.0)
MCH: 25.7 pg — ABNORMAL LOW (ref 26.0–34.0)
MCHC: 30.6 g/dL (ref 30.0–36.0)
MCV: 84.1 fL (ref 80.0–100.0)
Monocytes Absolute: 0.6 10*3/uL (ref 0.1–1.0)
Monocytes Relative: 7 %
Neutro Abs: 6 10*3/uL (ref 1.7–7.7)
Neutrophils Relative %: 67 %
Platelets: 286 10*3/uL (ref 150–400)
RBC: 3.89 MIL/uL (ref 3.87–5.11)
RDW: 17.5 % — ABNORMAL HIGH (ref 11.5–15.5)
WBC: 8.9 10*3/uL (ref 4.0–10.5)
nRBC: 0 % (ref 0.0–0.2)

## 2022-04-09 LAB — BASIC METABOLIC PANEL
Anion gap: 11 (ref 5–15)
BUN: 21 mg/dL (ref 8–23)
CO2: 24 mmol/L (ref 22–32)
Calcium: 8.9 mg/dL (ref 8.9–10.3)
Chloride: 106 mmol/L (ref 98–111)
Creatinine, Ser: 2.45 mg/dL — ABNORMAL HIGH (ref 0.44–1.00)
GFR, Estimated: 22 mL/min — ABNORMAL LOW (ref >60)
Glucose, Bld: 174 mg/dL — ABNORMAL HIGH (ref 70–99)
Potassium: 4.2 mmol/L (ref 3.5–5.1)
Sodium: 141 mmol/L (ref 135–145)

## 2022-04-09 LAB — BRAIN NATRIURETIC PEPTIDE: B Natriuretic Peptide: 156.4 pg/mL — ABNORMAL HIGH (ref 0.0–100.0)

## 2022-04-09 LAB — TROPONIN I (HIGH SENSITIVITY): Troponin I (High Sensitivity): 5 ng/L (ref <18)

## 2022-04-09 MED ORDER — POTASSIUM CHLORIDE ER 20 MEQ PO TBCR
20.0000 mg | EXTENDED_RELEASE_TABLET | Freq: Two times a day (BID) | ORAL | 0 refills | Status: DC
  Filled 2022-04-09: qty 60, fill #0
  Filled 2022-05-12: qty 60, 30d supply, fill #0
  Filled 2022-05-23: qty 48, 24d supply, fill #0
  Filled 2022-05-25: qty 12, 6d supply, fill #0

## 2022-04-09 MED ORDER — FUROSEMIDE 20 MG PO TABS
80.0000 mg | ORAL_TABLET | Freq: Once | ORAL | Status: AC
  Administered 2022-04-09: 80 mg via ORAL
  Filled 2022-04-09: qty 4

## 2022-04-09 MED ORDER — FUROSEMIDE 10 MG/ML IJ SOLN
40.0000 mg | Freq: Once | INTRAMUSCULAR | Status: AC
  Administered 2022-04-09: 40 mg via INTRAVENOUS
  Filled 2022-04-09: qty 4

## 2022-04-09 MED ORDER — FUROSEMIDE 40 MG PO TABS
80.0000 mg | ORAL_TABLET | Freq: Two times a day (BID) | ORAL | 0 refills | Status: DC
  Filled 2022-04-09: qty 30, 8d supply, fill #0

## 2022-04-09 MED ORDER — FUROSEMIDE 10 MG/ML IJ SOLN
80.0000 mg | Freq: Once | INTRAMUSCULAR | Status: DC
  Filled 2022-04-09: qty 8

## 2022-04-09 NOTE — ED Provider Notes (Addendum)
[facility] EMERGENCY DEPARTMENT Provider Note   CSN: [id number] Arrival date & time: 04/09/22  1324     History  Chief Complaint  Patient presents with   Shortness of Breath    Meghan Terry is a 61 y.o. female.  HPI Patient is here for evaluation of generalized swelling, from her face to her toes.  She is taking her usual Lasix medication as prescribed but feels that she is not urinating enough.  She has shortness of breath and has been feeling worse over the last 2 days.  She is able to sleep flat at night, has trouble sleeping but attributes it to "stopping breathing," and does not really have an exacerbation of dyspnea with supine position.  She has not been coughing.  She has nausea and decreased appetite but has not been vomiting.  She denies other changes in urine, and no problems with her bowels.  She is not having any fever.  She has had chest pain on and off yesterday and constant today for 4 hours.  She took a nitroglycerin while she was waiting to be seen, in the waiting room.  She has a history of coronary bypass grafting and cardiac stenting as well.  She sees her cardiologist regularly.  Wt Readings from Last 3 Encounters:  04/09/22 87 kg  02/11/22 84.3 kg  12/12/21 80.3 kg     Wt Readings from Last 3 Encounters:  04/09/22 87 kg  02/11/22 84.3 kg  12/12/21 80.3 kg       Home Medications Prior to Admission medications   Medication Sig Start Date End Date Taking? Authorizing Provider  furosemide (LASIX) 40 MG tablet Take 2 tablets (80 mg total) by mouth 2 (two) times daily. 04/09/22  Yes Daleen Bo, MD  acetaminophen (TYLENOL) 500 MG tablet Take 2 tablets by mouth 3 times daily as needed for pain 12/08/21     aspirin EC 81 MG tablet Take 1 tablet (81 mg total) by mouth daily. 09/05/19   Revankar, Reita Cliche, MD  buPROPion (WELLBUTRIN XL) 300 MG 24 hr tablet Take 1 tablet by mouth daily 08/02/21     cholecalciferol (VITAMIN D) 25 MCG (1000 UNIT)  tablet Take 2 tablets by mouth every day. Patient taking differently: Take 1,000 Units by mouth 2 (two) times daily. 05/27/21     clonazePAM (KLONOPIN) 0.5 MG tablet Take 1 tablet (0.5 mg total) by mouth at bedtime. 01/07/22     clonazePAM (KLONOPIN) 0.5 MG tablet Take 1 tablet (0.5 mg) by mouth at bedtime 12/29/21     clonazePAM (KLONOPIN) 0.5 MG tablet Take 1 tablet (0.5 mg) by mouth at bedtime 03/23/22     clopidogrel (PLAVIX) 75 MG tablet TAKE 1 TABLET BY MOUTH DAILY 03/30/22   Revankar, Reita Cliche, MD  dapagliflozin propanediol (FARXIGA) 10 MG TABS tablet Take 1 tablet (10 mg total) by mouth daily before breakfast. 09/25/21   Revankar, Reita Cliche, MD  doxycycline (VIBRAMYCIN) 100 MG capsule Take 1 capsule (100 mg total) by mouth 2 (two) times daily. 11/24/21     fluconazole (DIFLUCAN) 150 MG tablet Take 1 (one) tablet by mouth as a 1 time dose 11/24/21     gabapentin (NEURONTIN) 100 MG capsule Take 2 capsules by mouth at bedtime 01/12/22     gabapentin (NEURONTIN) 800 MG tablet Take 1 tablet by mouth every night at bedtime for Restless Leg Syndrome 02/02/22     glimepiride (AMARYL) 1 MG tablet Take 1 tablet (1 mg) by mouth  once daily 03/23/22     ibuprofen (ADVIL) 600 MG tablet Take 1 tablet by mouth every 6 hours as needed for pain 12/08/21     levothyroxine (SYNTHROID) 50 MCG tablet TAKE 1 TABLET BY MOUTH ONCE DAILY ON AN EMPTY STOMACH 30 MINUTES BEFORE BREAKFAST ON SAT AND SUNDAY 07/21/21     levothyroxine (SYNTHROID) 75 MCG tablet TAKE 1 TABLET BY MOUTH ONCE DAILY MONDAY THRU FRIDAY 06/13/21     metoprolol succinate (TOPROL-XL) 50 MG 24 hr tablet Take 1 tablet (50 mg total) by mouth at bedtime. Take with or immediately following a meal. 02/16/22   Revankar, Reita Cliche, MD  nitroGLYCERIN (NITROSTAT) 0.4 MG SL tablet Place 1 tablet (0.4 mg total) under the tongue every 5 (five) minutes as needed for chest pain. 12/29/21   Revankar, Reita Cliche, MD  ondansetron (ZOFRAN) 4 MG tablet take 1 tablet (4 mg) by mouth 2 times per  day as needed for nausea 05/06/21     Potassium Chloride ER 20 MEQ TBCR Take 20 mg by mouth 2 (two) times daily. 04/09/22   Daleen Bo, MD  potassium chloride SA (KLOR-CON M) 20 MEQ tablet Take 1 tablet by mouth 2 times daily for 5 days then decrease to 2 tablets daily 11/20/21     promethazine (PHENERGAN) 25 MG tablet take 1 tablet (25 mg) by mouth every 6 hours as needed for 30 days 07/21/21     ranolazine (RANEXA) 500 MG 12 hr tablet Take 1 tablet by mouth 2 times daily 12/25/21   Revankar, Reita Cliche, MD  rosuvastatin (CRESTOR) 40 MG tablet take 1 tablet (40 mg) by mouth once daily for 90 days 07/21/21     Semaglutide,0.25 or 0.5MG /DOS, (OZEMPIC, 0.25 OR 0.5 MG/DOSE,) 2 MG/3ML SOPN Inject 0.25 mg under the skin once weekly on the same day of each week for 28 days 03/23/22     topiramate (TOPAMAX) 100 MG tablet TAKE 1 TABLET BY MOUTH TWICE DAILY. 06/20/21     traMADol (ULTRAM) 50 MG tablet Take 1 tablet by mouth once every 12 hours as needed for pain 01/17/22     traMADol (ULTRAM) 50 MG tablet Take 1 tablet by mouth every 12 hours as needed for pain 03/23/22     traZODone (DESYREL) 150 MG tablet TAKE 1 TABLET BY MOUTH ONCE DAILY 05/26/21         Allergies    Carbidopa-levodopa, Prednisone, and Venlafaxine    Review of Systems   Review of Systems  Physical Exam Updated Vital Signs BP 132/68   Pulse 90   Temp 98.2 F (36.8 C) (Oral)   Resp 18   Wt 87 kg   SpO2 100%   BMI 33.98 kg/m  Physical Exam Vitals and nursing note reviewed.  Constitutional:      General: She is not in acute distress.    Appearance: She is well-developed. She is not ill-appearing or diaphoretic.  HENT:     Head: Normocephalic and atraumatic.     Right Ear: External ear normal.     Left Ear: External ear normal.  Eyes:     Conjunctiva/sclera: Conjunctivae normal.     Pupils: Pupils are equal, round, and reactive to light.  Neck:     Trachea: Phonation normal.  Cardiovascular:     Rate and Rhythm: Normal rate  and regular rhythm.     Heart sounds: Normal heart sounds.  Pulmonary:     Effort: Pulmonary effort is normal. No respiratory distress.  Breath sounds: Normal breath sounds. No stridor.  Abdominal:     General: There is no distension.     Palpations: Abdomen is soft. There is no mass.     Tenderness: There is no abdominal tenderness.  Musculoskeletal:        General: Normal range of motion.     Cervical back: Normal range of motion and neck supple.     Right lower leg: Edema present.     Left lower leg: Edema present.     Comments: 2-3+ pitting edema of the lower legs bilaterally.  Skin:    General: Skin is warm and dry.  Neurological:     Mental Status: She is alert and oriented to person, place, and time.     Cranial Nerves: No cranial nerve deficit.     Sensory: No sensory deficit.     Motor: No abnormal muscle tone.     Coordination: Coordination normal.  Psychiatric:        Mood and Affect: Mood normal.        Behavior: Behavior normal.        Thought Content: Thought content normal.        Judgment: Judgment normal.     ED Results / Procedures / Treatments   Labs (all labs ordered are listed, but only abnormal results are displayed) Labs Reviewed  BASIC METABOLIC PANEL - Abnormal; Notable for the following components:      Result Value   Glucose, Bld 174 (*)    Creatinine, Ser 2.45 (*)    GFR, Estimated 22 (*)    All other components within normal limits  CBC WITH DIFFERENTIAL/PLATELET - Abnormal; Notable for the following components:   Hemoglobin 10.0 (*)    HCT 32.7 (*)    MCH 25.7 (*)    RDW 17.5 (*)    Abs Immature Granulocytes 0.09 (*)    All other components within normal limits  BRAIN NATRIURETIC PEPTIDE - Abnormal; Notable for the following components:   B Natriuretic Peptide 156.4 (*)    All other components within normal limits  TROPONIN I (HIGH SENSITIVITY)  TROPONIN I (HIGH SENSITIVITY)    EKG EKG Interpretation  Date/Time:  Thursday April 09 2022 13:49:19 EDT Ventricular Rate:  84 PR Interval:  150 QRS Duration: 82 QT Interval:  352 QTC Calculation: 415 R Axis:   74 Text Interpretation: Normal sinus rhythm Normal ECG When compared with ECG of 12-Dec-2021 18:03, PREVIOUS ECG IS PRESENT since last tracing no significant change Confirmed by Daleen Bo [phone]) on 04/09/2022 4:36:10 PM  Radiology DG Chest 2 View  Result Date: 04/09/2022 CLINICAL DATA:  Prior median sternotomy. Diabetes. Shortness of breath. EXAM: CHEST - 2 VIEW COMPARISON:  12/12/2021 FINDINGS: Mild hyperinflation. Median sternotomy for CABG. Cholecystectomy. Midline trachea. Normal heart size and mediastinal contours. No pleural effusion or pneumothorax. Peripheral and basilar interstitial prominence. No lobar consolidation. IMPRESSION: Peripheral and basilar interstitial prominence, favored to represent septal thickening in the setting of mild interstitial edema. Electronically Signed   By: Abigail Miyamoto M.D.   On: 04/09/2022 14:56    Procedures Procedures    Medications Ordered in ED Medications  furosemide (LASIX) injection 40 mg (40 mg Intravenous Given 04/09/22 1744)  furosemide (LASIX) tablet 80 mg (80 mg Oral Given 04/09/22 2040)    ED Course/ Medical Decision Making/ A&P  Medical Decision Making Patient presenting with swelling.  She has a history of diastolic heart failure.  Last cardiac echo in April 2022 had left ventricular ejection fraction 60 to 65%.  Also had cardiac catheterization that month.  At that time there was no change in chronic coronary disease and status of bypass grafts.  She has multivessel disease.  Amount and/or Complexity of Data Reviewed External Data Reviewed: notes.    Details: Patient had cardiology evaluation 02/11/2022 and at that time was stable.  Plan 58-month follow-up.  Also saw a nephrologist for initial consult on 02/16/2022 to transfer care from [place] [facility].  At that time her Lasix  was changed to 40 mg twice daily with recommendation to continue potassium 20 mEq a day. Labs: ordered.    Details: CBC, metabolic panel, troponin, and BNP-normal except glucose high, creatinine GFR low, hemoglobin low Radiology: ordered and independent interpretation performed.    Details: Chest x-ray-mild pulmonary vascular congestion ECG/medicine tests: ordered and independent interpretation performed.    Details: Cardiac monitor-normal sinus rhythm  Risk Prescription drug management. Decision regarding hospitalization. Risk Details: Patient presenting with sensation of swelling.  No respiratory or cardiac symptoms.  Weight is about 3 kg over her baseline several weeks ago.  She is taking Lasix 60 mg in the morning 40 mg in the evening.  She is stable chronic renal insufficiency.  She was treated with Lasix in the ED.  Plan to increase Lasix and potassium as follows Lasix 80 mg twice a day with potassium 20 mEq twice a day.  Recommend PCP follow-up for repeat lab testing and reassessment in 1 week ; return here if needed.  Critical Care Total time providing critical care: 40 minutes           Final Clinical Impression(s) / ED Diagnoses Final diagnoses:  Acute on chronic congestive heart failure, unspecified heart failure type ([place])    Rx / DC Orders ED Discharge Orders          Ordered    furosemide (LASIX) 40 MG tablet  2 times daily        04/09/22 1956    Potassium Chloride ER 20 MEQ TBCR  2 times daily        04/09/22 1959              Daleen Bo, MD 04/09/22 2259    Daleen Bo, MD 04/28/22 1150

## 2022-04-09 NOTE — Discharge Instructions (Addendum)
Your weight has increased to more than 6 pounds.  Stay on a low-salt diet.  We are increasing her Lasix dose to 80 mg twice a day, and your potassium to 20 mEq twice a day.  See your doctor next week for a checkup and repeat blood testing.  Return here, if needed.

## 2022-04-09 NOTE — ED Notes (Signed)
Patient given discharge instructions, all questions answered. Patient in possession of all belongings, directed to the discharge area  

## 2022-04-09 NOTE — ED Triage Notes (Signed)
Patient complains of shortness of breath and chest pain that started yesterday but got worse this morning. Patient is alert, oriented, speaking in complete sentences, and is no apparent distress at this time. Patient states she took at SL NTG at home and that improved her pain.Marland Kitchen

## 2022-04-09 NOTE — ED Provider Triage Note (Signed)
Emergency Medicine Provider Triage Evaluation Note  DAYJAH SELMAN , a 61 y.o. female  was evaluated in triage.  Pt complains of shortness of breath onset 2-3 days ago.  Patient is she has a history of CHF and typically takes Lasix 60 mg and then 40 mg daily.  Notes her shortness of breath is exacerbated with ambulating.  Also notes sternal chest pain onset today.  Denies nausea.  Review of Systems  Positive: As per HPI Negative:   Physical Exam  BP (!) 131/56 (BP Location: Right Arm)   Pulse 82   Temp 98.8 F (37.1 C) (Oral)   Resp (!) 22   SpO2 100%  Gen:   Awake, no distress   Resp:  Normal effort MSK:   Moves extremities without difficulty  Other:  1+ pitting edema noted bilaterally  Medical Decision Making  Medically screening exam initiated at 2:14 PM.  Appropriate orders placed.  [name] was informed that the remainder of the evaluation will be completed by another provider, this initial triage assessment does not replace that evaluation, and the importance of remaining in the ED until their evaluation is complete.  Work-up initiated   Kyreese Chio A, PA-C 04/09/22 1420

## 2022-04-10 ENCOUNTER — Other Ambulatory Visit (HOSPITAL_BASED_OUTPATIENT_CLINIC_OR_DEPARTMENT_OTHER): Payer: Self-pay

## 2022-04-13 ENCOUNTER — Other Ambulatory Visit (HOSPITAL_BASED_OUTPATIENT_CLINIC_OR_DEPARTMENT_OTHER): Payer: Self-pay

## 2022-04-16 ENCOUNTER — Other Ambulatory Visit (HOSPITAL_BASED_OUTPATIENT_CLINIC_OR_DEPARTMENT_OTHER): Payer: Self-pay

## 2022-04-20 ENCOUNTER — Other Ambulatory Visit (HOSPITAL_BASED_OUTPATIENT_CLINIC_OR_DEPARTMENT_OTHER): Payer: Self-pay

## 2022-04-21 ENCOUNTER — Other Ambulatory Visit (HOSPITAL_BASED_OUTPATIENT_CLINIC_OR_DEPARTMENT_OTHER): Payer: Self-pay

## 2022-04-23 ENCOUNTER — Other Ambulatory Visit (HOSPITAL_BASED_OUTPATIENT_CLINIC_OR_DEPARTMENT_OTHER): Payer: Self-pay

## 2022-04-24 ENCOUNTER — Other Ambulatory Visit (HOSPITAL_BASED_OUTPATIENT_CLINIC_OR_DEPARTMENT_OTHER): Payer: Self-pay

## 2022-04-24 MED ORDER — GABAPENTIN 800 MG PO TABS
ORAL_TABLET | ORAL | 0 refills | Status: DC
  Filled 2022-04-24: qty 90, 90d supply, fill #0

## 2022-04-27 ENCOUNTER — Other Ambulatory Visit (HOSPITAL_BASED_OUTPATIENT_CLINIC_OR_DEPARTMENT_OTHER): Payer: Self-pay

## 2022-04-28 ENCOUNTER — Other Ambulatory Visit (HOSPITAL_BASED_OUTPATIENT_CLINIC_OR_DEPARTMENT_OTHER): Payer: Self-pay

## 2022-04-29 ENCOUNTER — Emergency Department (HOSPITAL_COMMUNITY): Payer: Commercial Managed Care - HMO

## 2022-04-29 ENCOUNTER — Inpatient Hospital Stay (HOSPITAL_COMMUNITY)
Admission: EM | Admit: 2022-04-29 | Discharge: 2022-05-02 | DRG: 683 | Disposition: A | Discharge status: Home Health | Payer: Commercial Managed Care - HMO | Attending: Family Medicine | Admitting: Family Medicine

## 2022-04-29 ENCOUNTER — Encounter (HOSPITAL_COMMUNITY): Payer: Self-pay

## 2022-04-29 ENCOUNTER — Other Ambulatory Visit: Payer: Self-pay

## 2022-04-29 DIAGNOSIS — Z833 Family history of diabetes mellitus: Secondary | ICD-10-CM

## 2022-04-29 DIAGNOSIS — I5032 Chronic diastolic (congestive) heart failure: Secondary | ICD-10-CM | POA: Diagnosis present

## 2022-04-29 DIAGNOSIS — Z888 Allergy status to other drugs, medicaments and biological substances status: Secondary | ICD-10-CM

## 2022-04-29 DIAGNOSIS — N179 Acute kidney failure, unspecified: Secondary | ICD-10-CM | POA: Diagnosis not present

## 2022-04-29 DIAGNOSIS — Z7982 Long term (current) use of aspirin: Secondary | ICD-10-CM

## 2022-04-29 DIAGNOSIS — I10 Essential (primary) hypertension: Secondary | ICD-10-CM | POA: Diagnosis present

## 2022-04-29 DIAGNOSIS — Z8639 Personal history of other endocrine, nutritional and metabolic disease: Secondary | ICD-10-CM

## 2022-04-29 DIAGNOSIS — N184 Chronic kidney disease, stage 4 (severe): Secondary | ICD-10-CM | POA: Diagnosis present

## 2022-04-29 DIAGNOSIS — E1122 Type 2 diabetes mellitus with diabetic chronic kidney disease: Secondary | ICD-10-CM | POA: Diagnosis present

## 2022-04-29 DIAGNOSIS — Z825 Family history of asthma and other chronic lower respiratory diseases: Secondary | ICD-10-CM

## 2022-04-29 DIAGNOSIS — E785 Hyperlipidemia, unspecified: Secondary | ICD-10-CM | POA: Diagnosis present

## 2022-04-29 DIAGNOSIS — Z951 Presence of aortocoronary bypass graft: Secondary | ICD-10-CM

## 2022-04-29 DIAGNOSIS — R55 Syncope and collapse: Principal | ICD-10-CM

## 2022-04-29 DIAGNOSIS — Z79899 Other long term (current) drug therapy: Secondary | ICD-10-CM

## 2022-04-29 DIAGNOSIS — Z66 Do not resuscitate: Secondary | ICD-10-CM | POA: Diagnosis present

## 2022-04-29 DIAGNOSIS — N390 Urinary tract infection, site not specified: Secondary | ICD-10-CM | POA: Diagnosis present

## 2022-04-29 DIAGNOSIS — Z955 Presence of coronary angioplasty implant and graft: Secondary | ICD-10-CM

## 2022-04-29 DIAGNOSIS — I2510 Atherosclerotic heart disease of native coronary artery without angina pectoris: Secondary | ICD-10-CM | POA: Diagnosis present

## 2022-04-29 DIAGNOSIS — Z87891 Personal history of nicotine dependence: Secondary | ICD-10-CM

## 2022-04-29 DIAGNOSIS — I130 Hypertensive heart and chronic kidney disease with heart failure and stage 1 through stage 4 chronic kidney disease, or unspecified chronic kidney disease: Secondary | ICD-10-CM | POA: Diagnosis present

## 2022-04-29 DIAGNOSIS — G2581 Restless legs syndrome: Secondary | ICD-10-CM | POA: Diagnosis present

## 2022-04-29 DIAGNOSIS — E039 Hypothyroidism, unspecified: Secondary | ICD-10-CM | POA: Diagnosis present

## 2022-04-29 DIAGNOSIS — Z7902 Long term (current) use of antithrombotics/antiplatelets: Secondary | ICD-10-CM

## 2022-04-29 DIAGNOSIS — I509 Heart failure, unspecified: Secondary | ICD-10-CM

## 2022-04-29 DIAGNOSIS — Z7984 Long term (current) use of oral hypoglycemic drugs: Secondary | ICD-10-CM

## 2022-04-29 DIAGNOSIS — E860 Dehydration: Secondary | ICD-10-CM | POA: Diagnosis present

## 2022-04-29 DIAGNOSIS — E876 Hypokalemia: Secondary | ICD-10-CM | POA: Diagnosis present

## 2022-04-29 DIAGNOSIS — Z8249 Family history of ischemic heart disease and other diseases of the circulatory system: Secondary | ICD-10-CM

## 2022-04-29 DIAGNOSIS — Z79890 Hormone replacement therapy (postmenopausal): Secondary | ICD-10-CM

## 2022-04-29 HISTORY — DX: Syncope and collapse: R55

## 2022-04-29 LAB — CBC WITH DIFFERENTIAL/PLATELET
Abs Immature Granulocytes: 0.08 10*3/uL — ABNORMAL HIGH (ref 0.00–0.07)
Basophils Absolute: 0 10*3/uL (ref 0.0–0.1)
Basophils Relative: 0 %
Eosinophils Absolute: 0.3 10*3/uL (ref 0.0–0.5)
Eosinophils Relative: 3 %
HCT: 32.1 % — ABNORMAL LOW (ref 36.0–46.0)
Hemoglobin: 9.9 g/dL — ABNORMAL LOW (ref 12.0–15.0)
Immature Granulocytes: 1 %
Lymphocytes Relative: 15 %
Lymphs Abs: 1.7 10*3/uL (ref 0.7–4.0)
MCH: 25.7 pg — ABNORMAL LOW (ref 26.0–34.0)
MCHC: 30.8 g/dL (ref 30.0–36.0)
MCV: 83.4 fL (ref 80.0–100.0)
Monocytes Absolute: 0.6 10*3/uL (ref 0.1–1.0)
Monocytes Relative: 6 %
Neutro Abs: 8.2 10*3/uL — ABNORMAL HIGH (ref 1.7–7.7)
Neutrophils Relative %: 75 %
Platelets: 295 10*3/uL (ref 150–400)
RBC: 3.85 MIL/uL — ABNORMAL LOW (ref 3.87–5.11)
RDW: 17.1 % — ABNORMAL HIGH (ref 11.5–15.5)
WBC: 10.9 10*3/uL — ABNORMAL HIGH (ref 4.0–10.5)
nRBC: 0 % (ref 0.0–0.2)

## 2022-04-29 LAB — URINALYSIS, ROUTINE W REFLEX MICROSCOPIC
Bilirubin Urine: NEGATIVE
Glucose, UA: >=500 mg/dL — AB
Ketones, ur: NEGATIVE mg/dL
Nitrite: NEGATIVE
Protein, ur: 100 mg/dL — AB
Specific Gravity, Urine: 1.007 (ref 1.005–1.030)
pH: 6 (ref 5.0–8.0)

## 2022-04-29 LAB — BASIC METABOLIC PANEL
Anion gap: 7 (ref 5–15)
BUN: 40 mg/dL — ABNORMAL HIGH (ref 8–23)
CO2: 26 mmol/L (ref 22–32)
Calcium: 8.3 mg/dL — ABNORMAL LOW (ref 8.9–10.3)
Chloride: 104 mmol/L (ref 98–111)
Creatinine, Ser: 3.42 mg/dL — ABNORMAL HIGH (ref 0.44–1.00)
GFR, Estimated: 14 mL/min — ABNORMAL LOW (ref >60)
Glucose, Bld: 90 mg/dL (ref 70–99)
Potassium: 3 mmol/L — ABNORMAL LOW (ref 3.5–5.1)
Sodium: 137 mmol/L (ref 135–145)

## 2022-04-29 LAB — TROPONIN I (HIGH SENSITIVITY)
Troponin I (High Sensitivity): 5 ng/L (ref <18)
Troponin I (High Sensitivity): 5 ng/L (ref <18)

## 2022-04-29 LAB — CBG MONITORING, ED: Glucose-Capillary: 88 mg/dL (ref 70–99)

## 2022-04-29 MED ORDER — SODIUM CHLORIDE 0.9 % IV SOLN
1.0000 g | Freq: Once | INTRAVENOUS | Status: AC
  Administered 2022-04-29: 1 g via INTRAVENOUS
  Filled 2022-04-29: qty 10

## 2022-04-29 MED ORDER — ACETAMINOPHEN 325 MG PO TABS
650.0000 mg | ORAL_TABLET | Freq: Once | ORAL | Status: AC
  Administered 2022-04-29: 650 mg via ORAL
  Filled 2022-04-29: qty 2

## 2022-04-29 MED ORDER — HYDROCODONE-ACETAMINOPHEN 5-325 MG PO TABS
1.0000 | ORAL_TABLET | Freq: Once | ORAL | Status: AC
  Administered 2022-04-29: 1 via ORAL
  Filled 2022-04-29: qty 1

## 2022-04-29 NOTE — ED Triage Notes (Signed)
Patient brought in by [facility] for syncope.  Reports patient was at a store and felt dizzy and passed out.  Patient hit her head on the floor.  Patient does take blood thinner for cardiac hx.  Patient states that she felt dizzy again when EMS stood her up to get on the stretcher.  Complaints of bilateral hip and knee pain.

## 2022-04-29 NOTE — ED Notes (Signed)
Patient transported to CT 

## 2022-04-29 NOTE — ED Provider Notes (Signed)
[facility] Provider Note   CSN: [id number] Arrival date & time: 04/29/22  1600     History  Chief Complaint  Patient presents with   Loss of Consciousness    Meghan Terry is a 61 y.o. female. 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  She states she was at the gas station walking inside when she suddenly felt dizzy.  She states that she felt herself losing her balance and grabbed onto something in the convenience store.  She states after this she just remembers waking up with somebody above her asking if she was okay.  She is unsure if she hit her head.  She denies having any chest pain or shortness of breath or nausea prior to or after the episode.  She is currently not feeling dizzy.  States that she has some mild pain in both of her knees as well as her left hip.  She is also having neck pain which she thinks is from the c-collar in place.  She states that she has been eating and drinking appropriately and has had no recent illnesses.  She is anticoagulated.   Loss of Consciousness Associated symptoms: dizziness   Associated symptoms: no chest pain, no fever, no nausea, no shortness of breath and no vomiting        Home Medications Prior to Admission medications   Medication Sig Start Date End Date Taking? Authorizing Provider  acetaminophen (TYLENOL) 500 MG tablet Take 2 tablets by mouth 3 times daily as needed for pain 12/08/21     aspirin EC 81 MG tablet Take 1 tablet (81 mg total) by mouth daily. 09/05/19   Revankar, Reita Cliche, MD  buPROPion (WELLBUTRIN XL) 300 MG 24 hr tablet Take 1 tablet by mouth daily 08/02/21     cholecalciferol (VITAMIN D) 25 MCG (1000 UNIT) tablet Take 2 tablets by mouth every day. Patient taking differently: Take 1,000 Units by mouth 2 (two) times daily. 05/27/21     clonazePAM (KLONOPIN) 0.5 MG tablet  Take 1 tablet (0.5 mg total) by mouth at bedtime. 01/07/22     clonazePAM (KLONOPIN) 0.5 MG tablet Take 1 tablet (0.5 mg) by mouth at bedtime 12/29/21     clonazePAM (KLONOPIN) 0.5 MG tablet Take 1 tablet (0.5 mg) by mouth at bedtime 03/23/22     clopidogrel (PLAVIX) 75 MG tablet TAKE 1 TABLET BY MOUTH DAILY 03/30/22   Revankar, Reita Cliche, MD  dapagliflozin propanediol (FARXIGA) 10 MG TABS tablet Take 1 tablet (10 mg total) by mouth daily before breakfast. 09/25/21   Revankar, Reita Cliche, MD  doxycycline (VIBRAMYCIN) 100 MG capsule Take 1 capsule (100 mg total) by mouth 2 (two) times daily. 11/24/21     fluconazole (DIFLUCAN) 150 MG tablet Take 1 (one) tablet by mouth as a 1 time dose 11/24/21     furosemide (LASIX) 40 MG tablet Take 2 tablets (80 mg total) by mouth 2 (two) times daily. 04/09/22   Daleen Bo, MD  gabapentin (NEURONTIN) 100 MG capsule Take 2 capsules by mouth at bedtime 01/12/22     gabapentin (NEURONTIN) 800 MG tablet Take 1 tablet (800 mg) by mouth at bedtime for RLS 04/24/22     glimepiride (AMARYL) 1 MG tablet Take 1 tablet (1 mg) by mouth once daily 03/23/22     ibuprofen (ADVIL) 600 MG tablet Take 1 tablet by mouth every  6 hours as needed for pain 12/08/21     levothyroxine (SYNTHROID) 50 MCG tablet TAKE 1 TABLET BY MOUTH ONCE DAILY ON AN EMPTY STOMACH 30 MINUTES BEFORE BREAKFAST ON SAT AND SUNDAY 07/21/21     levothyroxine (SYNTHROID) 75 MCG tablet TAKE 1 TABLET BY MOUTH ONCE DAILY MONDAY THRU FRIDAY 06/13/21     metoprolol succinate (TOPROL-XL) 50 MG 24 hr tablet Take 1 tablet (50 mg total) by mouth at bedtime. Take with or immediately following a meal. 02/16/22   Revankar, Reita Cliche, MD  nitroGLYCERIN (NITROSTAT) 0.4 MG SL tablet Place 1 tablet (0.4 mg total) under the tongue every 5 (five) minutes as needed for chest pain. 12/29/21   Revankar, Reita Cliche, MD  ondansetron (ZOFRAN) 4 MG tablet take 1 tablet (4 mg) by mouth 2 times per day as needed for nausea 05/06/21     Potassium Chloride ER 20 MEQ  TBCR Take 1 tablet by mouth 2 (two) times daily. 04/09/22   Daleen Bo, MD  potassium chloride SA (KLOR-CON M) 20 MEQ tablet Take 1 tablet by mouth 2 times daily for 5 days then decrease to 2 tablets daily 11/20/21     promethazine (PHENERGAN) 25 MG tablet take 1 tablet (25 mg) by mouth every 6 hours as needed for 30 days 07/21/21     ranolazine (RANEXA) 500 MG 12 hr tablet Take 1 tablet by mouth 2 times daily 12/25/21   Revankar, Reita Cliche, MD  rosuvastatin (CRESTOR) 40 MG tablet take 1 tablet (40 mg) by mouth once daily for 90 days 07/21/21     Semaglutide,0.25 or 0.5MG /DOS, (OZEMPIC, 0.25 OR 0.5 MG/DOSE,) 2 MG/3ML SOPN Inject 0.25 mg under the skin once weekly on the same day of each week for 28 days 03/23/22     topiramate (TOPAMAX) 100 MG tablet TAKE 1 TABLET BY MOUTH TWICE DAILY. 06/20/21     traMADol (ULTRAM) 50 MG tablet Take 1 tablet by mouth once every 12 hours as needed for pain 01/17/22     traMADol (ULTRAM) 50 MG tablet Take 1 tablet by mouth every 12 hours as needed for pain 03/23/22     traZODone (DESYREL) 150 MG tablet TAKE 1 TABLET BY MOUTH ONCE DAILY 05/26/21         Allergies    Carbidopa-levodopa, Prednisone, and Venlafaxine    Review of Systems   Review of Systems  Constitutional:  Negative for fever.  Respiratory:  Negative for shortness of breath.   Cardiovascular:  Positive for syncope. Negative for chest pain.  Gastrointestinal:  Negative for abdominal pain, nausea and vomiting.  Musculoskeletal:  Positive for arthralgias and neck pain.  Neurological:  Positive for dizziness and syncope.  All other systems reviewed and are negative.   Physical Exam Updated Vital Signs BP (!) 120/55   Pulse 80   Temp 98.6 F (37 C) (Oral)   Resp 17   Ht 5\' 3"  (1.6 m)   Wt 82.6 kg   SpO2 98%   BMI 32.24 kg/m  Physical Exam Vitals and nursing note reviewed.  Constitutional:      General: She is not in acute distress.    Appearance: Normal appearance. She is ill-appearing. She  is not toxic-appearing.  HENT:     Head: Normocephalic.     Nose: Nose normal.     Mouth/Throat:     Mouth: Mucous membranes are moist.     Pharynx: Oropharynx is clear.  Eyes:     General: No scleral icterus.  Extraocular Movements: Extraocular movements intact.     Pupils: Pupils are equal, round, and reactive to light.  Cardiovascular:     Rate and Rhythm: Normal rate and regular rhythm.     Pulses: Normal pulses.     Heart sounds: No murmur heard. Pulmonary:     Effort: Pulmonary effort is normal. No respiratory distress.     Breath sounds: Normal breath sounds.  Abdominal:     General: Bowel sounds are normal. There is no distension.     Palpations: Abdomen is soft.     Tenderness: There is no abdominal tenderness.  Musculoskeletal:        General: Tenderness present. No swelling, deformity or signs of injury. Normal range of motion.     Cervical back: Tenderness present.  Skin:    General: Skin is warm and dry.     Capillary Refill: Capillary refill takes less than 2 seconds.  Neurological:     General: No focal deficit present.     Mental Status: She is alert and oriented to person, place, and time. Mental status is at baseline.     Cranial Nerves: No cranial nerve deficit.     Sensory: No sensory deficit.     Motor: No weakness.  Psychiatric:        Mood and Affect: Mood normal.        Behavior: Behavior normal.        Thought Content: Thought content normal.        Judgment: Judgment normal.    ED Results / Procedures / Treatments   Labs (all labs ordered are listed, but only abnormal results are displayed) Labs Reviewed  CBC WITH DIFFERENTIAL/PLATELET - Abnormal; Notable for the following components:      Result Value   WBC 10.9 (*)    RBC 3.85 (*)    Hemoglobin 9.9 (*)    HCT 32.1 (*)    MCH 25.7 (*)    RDW 17.1 (*)    Neutro Abs 8.2 (*)    Abs Immature Granulocytes 0.08 (*)    All other components within normal limits  BASIC METABOLIC PANEL   URINALYSIS, ROUTINE W REFLEX MICROSCOPIC  CBG MONITORING, ED  TROPONIN I (HIGH SENSITIVITY)  TROPONIN I (HIGH SENSITIVITY)   EKG EKG Interpretation  Date/Time:  Wednesday April 29 2022 17:59:47 EDT Ventricular Rate:  77 PR Interval:  156 QRS Duration: 108 QT Interval:  404 QTC Calculation: 458 R Axis:   64 Text Interpretation: Sinus rhythm Borderline T abnormalities, anterior leads since last tracing no significant change Confirmed by Daleen Bo [phone]) on 04/29/2022 6:15:43 PM  Radiology CT Head Wo Contrast  Result Date: 04/29/2022 CLINICAL DATA:  Syncope EXAM: CT HEAD WITHOUT CONTRAST CT CERVICAL SPINE WITHOUT CONTRAST TECHNIQUE: Multidetector CT imaging of the head and cervical spine was performed following the standard protocol without intravenous contrast. Multiplanar CT image reconstructions of the cervical spine were also generated. RADIATION DOSE REDUCTION: This exam was performed according to the departmental dose-optimization program which includes automated exposure control, adjustment of the mA and/or kV according to patient size and/or use of iterative reconstruction technique. COMPARISON:  MRI head 04/26/2017 FINDINGS: CT HEAD FINDINGS Brain: Mild atrophy. Mild white matter hypodensity bilaterally unchanged. Negative for acute infarct, hemorrhage, mass. Empty sella unchanged from the prior study Vascular: Negative for hyperdense vessel Skull: Negative for skull fracture. Mild scalp contusion right occipital parietal region. Sinuses/Orbits: Mild bony thickening of the right maxillary sinus. No significant mucosal edema in the  sinuses. Mastoid clear. Negative orbit Other: None CT CERVICAL SPINE FINDINGS Alignment: Normal Skull base and vertebrae: Negative for fracture Soft tissues and spinal canal: Negative for soft tissue mass or adenopathy. Atherosclerotic calcification in the carotid and mild bilaterally. Disc levels: Disc degeneration and spurring most prominent at C4-5 and  C5-6 mild spinal stenosis and moderate foraminal stenosis bilaterally at C5-6. Mild left foraminal narrowing at C4-5. Upper chest: Lung apices clear bilaterally. Other: None IMPRESSION: 1. No acute intracranial abnormality. Mild right occipital parietal scalp contusion. 2. Cervical spondylosis.  Negative for cervical fracture. Electronically Signed   By: Franchot Gallo M.D.   On: 04/29/2022 17:50   CT Cervical Spine Wo Contrast  Result Date: 04/29/2022 CLINICAL DATA:  Syncope EXAM: CT HEAD WITHOUT CONTRAST CT CERVICAL SPINE WITHOUT CONTRAST TECHNIQUE: Multidetector CT imaging of the head and cervical spine was performed following the standard protocol without intravenous contrast. Multiplanar CT image reconstructions of the cervical spine were also generated. RADIATION DOSE REDUCTION: This exam was performed according to the departmental dose-optimization program which includes automated exposure control, adjustment of the mA and/or kV according to patient size and/or use of iterative reconstruction technique. COMPARISON:  MRI head 04/26/2017 FINDINGS: CT HEAD FINDINGS Brain: Mild atrophy. Mild white matter hypodensity bilaterally unchanged. Negative for acute infarct, hemorrhage, mass. Empty sella unchanged from the prior study Vascular: Negative for hyperdense vessel Skull: Negative for skull fracture. Mild scalp contusion right occipital parietal region. Sinuses/Orbits: Mild bony thickening of the right maxillary sinus. No significant mucosal edema in the sinuses. Mastoid clear. Negative orbit Other: None CT CERVICAL SPINE FINDINGS Alignment: Normal Skull base and vertebrae: Negative for fracture Soft tissues and spinal canal: Negative for soft tissue mass or adenopathy. Atherosclerotic calcification in the carotid and mild bilaterally. Disc levels: Disc degeneration and spurring most prominent at C4-5 and C5-6 mild spinal stenosis and moderate foraminal stenosis bilaterally at C5-6. Mild left foraminal  narrowing at C4-5. Upper chest: Lung apices clear bilaterally. Other: None IMPRESSION: 1. No acute intracranial abnormality. Mild right occipital parietal scalp contusion. 2. Cervical spondylosis.  Negative for cervical fracture. Electronically Signed   By: Franchot Gallo M.D.   On: 04/29/2022 17:50   DG Hip Unilat With Pelvis 2-3 Views Left  Result Date: 04/29/2022 CLINICAL DATA:  Fall due to dizziness. EXAM: DG HIP (WITH OR WITHOUT PELVIS) 2-3V LEFT COMPARISON:  06/02/2018 FINDINGS: Pelvis and left hip appear intact. No acute displaced fractures identified. No focal bone lesion or bone destruction. Mild degenerative changes in the lower lumbar spine and hips. IMPRESSION: Mild degenerative changes.  No acute displaced fractures identified. Electronically Signed   By: Lucienne Capers M.D.   On: 04/29/2022 17:42    Procedures Procedures   Medications Ordered in ED Medications - No data to display  ED Course/ Medical Decision Making/ A&P                           Medical Decision Making Amount and/or Complexity of Data Reviewed Labs: ordered. Radiology: ordered.  Risk OTC drugs.   61 year old female who presents to the emergency department with syncope.  Differential would include vasovagal syncope, ICH, hemorrhage, stroke, PE, dissection, arrhythmia, seizure, hypoglycemia. No reported seizure-like activity.  There was no postictal period, tongue biting, bladder incontinence.  Doubt this was a seizure. She has no focal deficits on my exam so less likely stroke and her CT head here is negative. No arrhythmia on her EKG.  She is  pending her troponins as she has a significant cardiac history.  Given this I have not ruled out ACS yet.  But her symptoms are inconsistent with a dissection. No evidence of bleed and her hemoglobin is stable. Patient is pending completed work-up.  If her troponins are negative and rest of her labs are normal including electrolytes, urine, orthostatics she can  likely be discharged home with close PCP follow-up and strict return precautions.  She is pending a BMP, UA, troponins, orthostatics at this time.  Patient care is being handed off to [employer], PA-C.  Please see her note for final disposition. Final Clinical Impression(s) / ED Diagnoses Final diagnoses:  None    Rx / DC Orders ED Discharge Orders     None         Mickie Hillier, PA-C 04/29/22 1849    Daleen Bo, MD 04/30/22 1123

## 2022-04-29 NOTE — ED Provider Notes (Signed)
   Patient signed out to me by Meghan Blaze, PA-C pending completion of work-up.   Patient here in the emergency department for evaluation after a syncopal episode that occurred earlier today.  She was in a store when she suddenly felt dizzy and "passed out.  She struck her head on the floor.  She takes clopidogrel daily  She complains of left knee pain, hip pain and headache.  See previous provider note for complete H&P  She has a large hematoma of the left parietal scalp.  She had CT of the head and C-spine without acute intracranial abnormality or acute cervical fracture.  Plain film imaging of the left hip and knee without evidence of a fracture  Labs without significant leukocytosis, hemoglobin near baseline.  Chemistries show worsening kidney function.  Serum creatinine was 2.4 on 04/09/2022 is now increased to 3.4 and GFR decreased from 22-14.  Of note, patient has CHF and her Lasix dose was recently increased due to CHF exacerbation from mid July.  Urinalysis shows possible urinary tract infection, culture pending.  IV Rocephin given  Patient given pain medication here, continues to have significant pain of her left knee  I feel that she would benefit from hospital admission for her syncope.  Agreeable to plan.      Kem Parkinson, PA-C 04/29/22 2336    Daleen Bo, MD 04/30/22 [phone]

## 2022-04-29 NOTE — ED Notes (Signed)
While taking orthostatic vitals the patient stated that she was very dizzy and leaned over.  Sat the patient up on the side of the bed. Assisted the patient in standing. Patient states that the room was spinning and had to close her eyes.  Assisted the patient back to the bed afterwards.

## 2022-04-30 ENCOUNTER — Observation Stay (HOSPITAL_COMMUNITY): Payer: Commercial Managed Care - HMO

## 2022-04-30 DIAGNOSIS — N3000 Acute cystitis without hematuria: Secondary | ICD-10-CM | POA: Diagnosis not present

## 2022-04-30 DIAGNOSIS — E785 Hyperlipidemia, unspecified: Secondary | ICD-10-CM | POA: Diagnosis present

## 2022-04-30 DIAGNOSIS — E039 Hypothyroidism, unspecified: Secondary | ICD-10-CM

## 2022-04-30 DIAGNOSIS — Z7982 Long term (current) use of aspirin: Secondary | ICD-10-CM | POA: Diagnosis not present

## 2022-04-30 DIAGNOSIS — E860 Dehydration: Secondary | ICD-10-CM | POA: Diagnosis present

## 2022-04-30 DIAGNOSIS — Z79890 Hormone replacement therapy (postmenopausal): Secondary | ICD-10-CM | POA: Diagnosis not present

## 2022-04-30 DIAGNOSIS — Z87891 Personal history of nicotine dependence: Secondary | ICD-10-CM | POA: Diagnosis not present

## 2022-04-30 DIAGNOSIS — N390 Urinary tract infection, site not specified: Secondary | ICD-10-CM

## 2022-04-30 DIAGNOSIS — R55 Syncope and collapse: Secondary | ICD-10-CM

## 2022-04-30 DIAGNOSIS — N179 Acute kidney failure, unspecified: Secondary | ICD-10-CM | POA: Diagnosis present

## 2022-04-30 DIAGNOSIS — I130 Hypertensive heart and chronic kidney disease with heart failure and stage 1 through stage 4 chronic kidney disease, or unspecified chronic kidney disease: Secondary | ICD-10-CM | POA: Diagnosis present

## 2022-04-30 DIAGNOSIS — Z7902 Long term (current) use of antithrombotics/antiplatelets: Secondary | ICD-10-CM | POA: Diagnosis not present

## 2022-04-30 DIAGNOSIS — E876 Hypokalemia: Secondary | ICD-10-CM

## 2022-04-30 DIAGNOSIS — I10 Essential (primary) hypertension: Secondary | ICD-10-CM

## 2022-04-30 DIAGNOSIS — I2510 Atherosclerotic heart disease of native coronary artery without angina pectoris: Secondary | ICD-10-CM | POA: Diagnosis present

## 2022-04-30 DIAGNOSIS — Z7984 Long term (current) use of oral hypoglycemic drugs: Secondary | ICD-10-CM | POA: Diagnosis not present

## 2022-04-30 DIAGNOSIS — Z825 Family history of asthma and other chronic lower respiratory diseases: Secondary | ICD-10-CM | POA: Diagnosis not present

## 2022-04-30 DIAGNOSIS — Z951 Presence of aortocoronary bypass graft: Secondary | ICD-10-CM | POA: Diagnosis not present

## 2022-04-30 DIAGNOSIS — N184 Chronic kidney disease, stage 4 (severe): Secondary | ICD-10-CM | POA: Diagnosis present

## 2022-04-30 DIAGNOSIS — Z79899 Other long term (current) drug therapy: Secondary | ICD-10-CM | POA: Diagnosis not present

## 2022-04-30 DIAGNOSIS — E1122 Type 2 diabetes mellitus with diabetic chronic kidney disease: Secondary | ICD-10-CM | POA: Diagnosis present

## 2022-04-30 DIAGNOSIS — Z66 Do not resuscitate: Secondary | ICD-10-CM | POA: Diagnosis present

## 2022-04-30 DIAGNOSIS — Z955 Presence of coronary angioplasty implant and graft: Secondary | ICD-10-CM | POA: Diagnosis not present

## 2022-04-30 DIAGNOSIS — I5032 Chronic diastolic (congestive) heart failure: Secondary | ICD-10-CM

## 2022-04-30 DIAGNOSIS — Z8249 Family history of ischemic heart disease and other diseases of the circulatory system: Secondary | ICD-10-CM | POA: Diagnosis not present

## 2022-04-30 DIAGNOSIS — Z888 Allergy status to other drugs, medicaments and biological substances status: Secondary | ICD-10-CM | POA: Diagnosis not present

## 2022-04-30 DIAGNOSIS — G2581 Restless legs syndrome: Secondary | ICD-10-CM | POA: Diagnosis present

## 2022-04-30 DIAGNOSIS — Z8639 Personal history of other endocrine, nutritional and metabolic disease: Secondary | ICD-10-CM

## 2022-04-30 HISTORY — DX: Hypokalemia: E87.6

## 2022-04-30 HISTORY — DX: Chronic kidney disease, stage 4 (severe): N17.9

## 2022-04-30 HISTORY — DX: Urinary tract infection, site not specified: N39.0

## 2022-04-30 LAB — GLUCOSE, CAPILLARY
Glucose-Capillary: 184 mg/dL — ABNORMAL HIGH (ref 70–99)
Glucose-Capillary: 136 mg/dL — ABNORMAL HIGH (ref 70–99)
Glucose-Capillary: 185 mg/dL — ABNORMAL HIGH (ref 70–99)
Glucose-Capillary: 163 mg/dL — ABNORMAL HIGH (ref 70–99)
Glucose-Capillary: 149 mg/dL — ABNORMAL HIGH (ref 70–99)

## 2022-04-30 LAB — COMPREHENSIVE METABOLIC PANEL
ALT: 11 U/L (ref 0–44)
AST: 15 U/L (ref 15–41)
Albumin: 3.4 g/dL — ABNORMAL LOW (ref 3.5–5.0)
Alkaline Phosphatase: 66 U/L (ref 38–126)
Anion gap: 8 (ref 5–15)
BUN: 41 mg/dL — ABNORMAL HIGH (ref 8–23)
CO2: 26 mmol/L (ref 22–32)
Calcium: 8.4 mg/dL — ABNORMAL LOW (ref 8.9–10.3)
Chloride: 105 mmol/L (ref 98–111)
Creatinine, Ser: 3.31 mg/dL — ABNORMAL HIGH (ref 0.44–1.00)
GFR, Estimated: 15 mL/min — ABNORMAL LOW (ref >60)
Glucose, Bld: 150 mg/dL — ABNORMAL HIGH (ref 70–99)
Potassium: 2.9 mmol/L — ABNORMAL LOW (ref 3.5–5.1)
Sodium: 139 mmol/L (ref 135–145)
Total Bilirubin: 0.3 mg/dL (ref 0.3–1.2)
Total Protein: 6.6 g/dL (ref 6.5–8.1)

## 2022-04-30 LAB — CBC WITH DIFFERENTIAL/PLATELET
Abs Immature Granulocytes: 0.05 10*3/uL (ref 0.00–0.07)
Basophils Absolute: 0.1 10*3/uL (ref 0.0–0.1)
Basophils Relative: 1 %
Eosinophils Absolute: 0.3 10*3/uL (ref 0.0–0.5)
Eosinophils Relative: 4 %
HCT: 32.4 % — ABNORMAL LOW (ref 36.0–46.0)
Hemoglobin: 9.7 g/dL — ABNORMAL LOW (ref 12.0–15.0)
Immature Granulocytes: 1 %
Lymphocytes Relative: 21 %
Lymphs Abs: 1.7 10*3/uL (ref 0.7–4.0)
MCH: 25.6 pg — ABNORMAL LOW (ref 26.0–34.0)
MCHC: 29.9 g/dL — ABNORMAL LOW (ref 30.0–36.0)
MCV: 85.5 fL (ref 80.0–100.0)
Monocytes Absolute: 0.6 10*3/uL (ref 0.1–1.0)
Monocytes Relative: 7 %
Neutro Abs: 5.4 10*3/uL (ref 1.7–7.7)
Neutrophils Relative %: 66 %
Platelets: 280 10*3/uL (ref 150–400)
RBC: 3.79 MIL/uL — ABNORMAL LOW (ref 3.87–5.11)
RDW: 17 % — ABNORMAL HIGH (ref 11.5–15.5)
WBC: 8.1 10*3/uL (ref 4.0–10.5)
nRBC: 0 % (ref 0.0–0.2)

## 2022-04-30 LAB — ECHOCARDIOGRAM COMPLETE
AR max vel: 2.04 cm2
AV Area VTI: 2.2 cm2
AV Area mean vel: 2.03 cm2
AV Mean grad: 3 mmHg
AV Peak grad: 6.3 mmHg
Ao pk vel: 1.25 m/s
Area-P 1/2: 3.6 cm2
Calc EF: 54.7 %
Height: 63 in
MV VTI: 2.03 cm2
S' Lateral: 3.1 cm
Single Plane A2C EF: 53 %
Single Plane A4C EF: 56.4 %
Weight: 2959.46 oz

## 2022-04-30 LAB — TSH: TSH: 1.701 u[IU]/mL (ref 0.350–4.500)

## 2022-04-30 LAB — HEMOGLOBIN A1C
Hgb A1c MFr Bld: 7 % — ABNORMAL HIGH (ref 4.8–5.6)
Mean Plasma Glucose: 154.2 mg/dL

## 2022-04-30 LAB — MAGNESIUM: Magnesium: 2.5 mg/dL — ABNORMAL HIGH (ref 1.7–2.4)

## 2022-04-30 IMAGING — DX DG CHEST 2V
2 series · 2 of 2 positions shown · non-contrast
Comparison: 06/08/2021

CLINICAL DATA: Chest pain and shortness of breath

EXAM:
CHEST - 2 VIEW

[w chest pa]
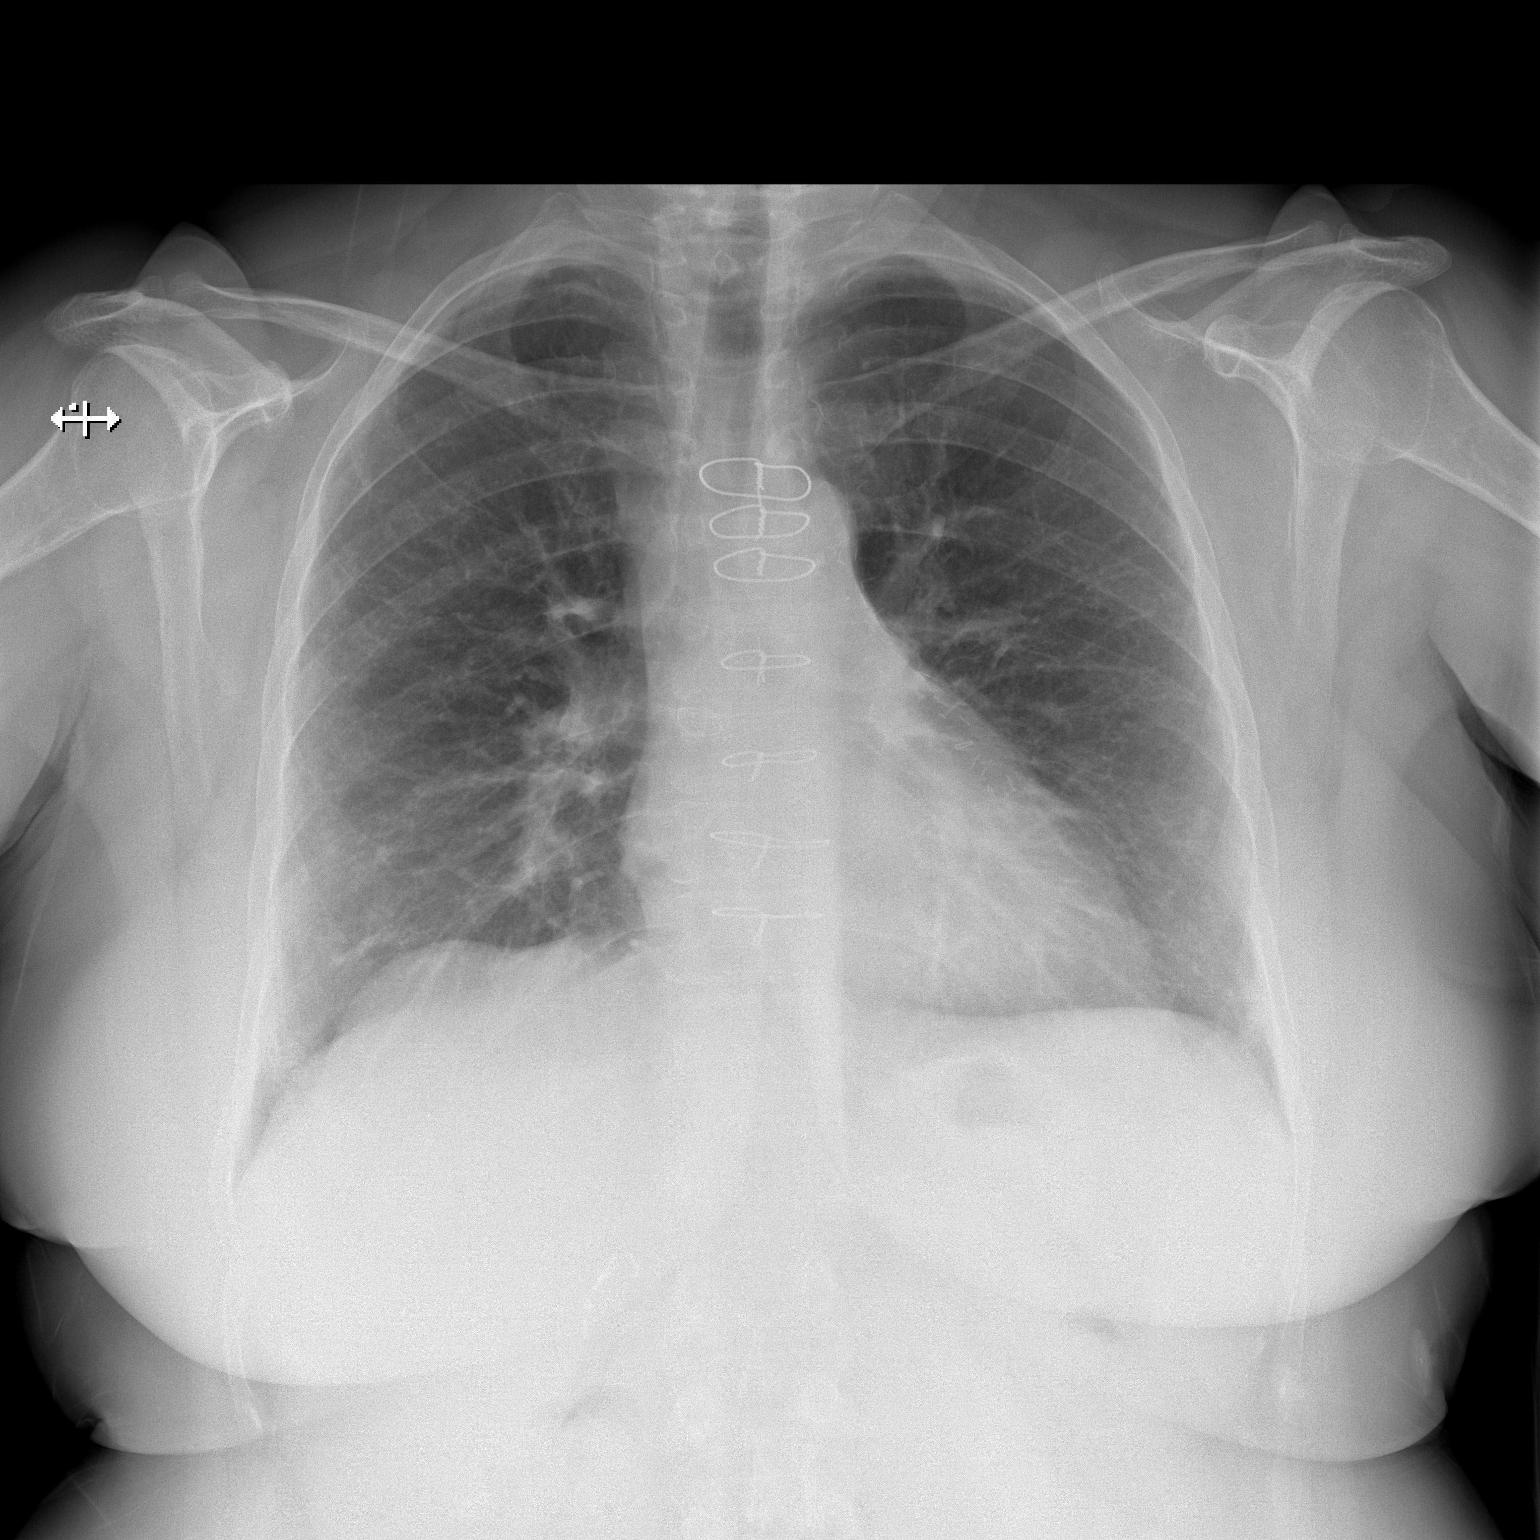

[w chest lat]
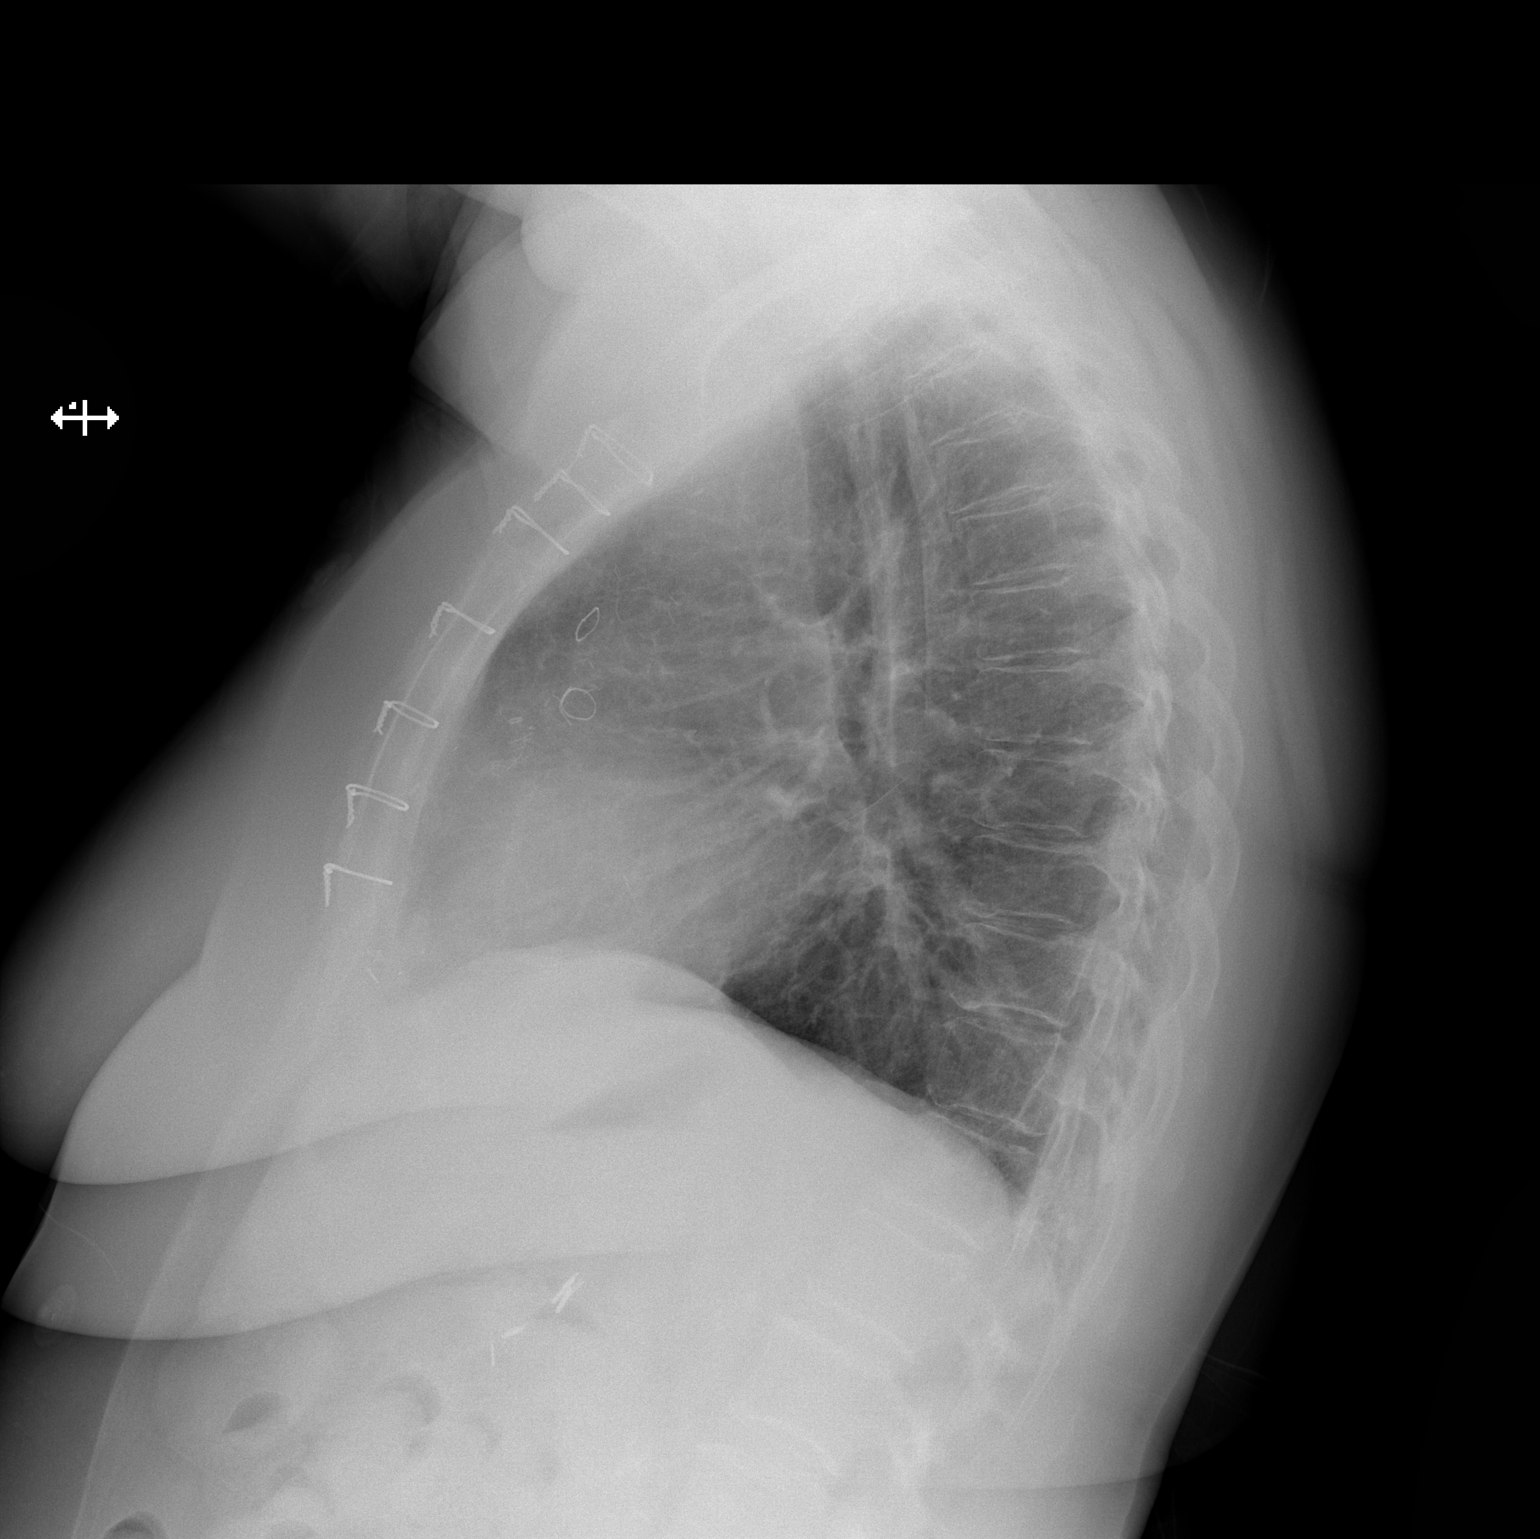

[2 of 2 positions shown; findings below may reference images not displayed]

FINDINGS: Status post median sternotomy and CABG. Mild, diffuse interstitial
opacity. The visualized skeletal structures are unremarkable.
IMPRESSION: Mild, diffuse interstitial opacity, consistent with edema. No focal
airspace opacity.

## 2022-04-30 MED ORDER — ROSUVASTATIN CALCIUM 20 MG PO TABS
40.0000 mg | ORAL_TABLET | Freq: Every day | ORAL | Status: DC
  Administered 2022-04-30 – 2022-05-02 (×3): 40 mg via ORAL
  Filled 2022-04-30 (×4): qty 2

## 2022-04-30 MED ORDER — OXYCODONE HCL 5 MG PO TABS
5.0000 mg | ORAL_TABLET | ORAL | Status: DC | PRN
  Administered 2022-04-30 – 2022-05-02 (×9): 5 mg via ORAL
  Filled 2022-04-30 (×9): qty 1

## 2022-04-30 MED ORDER — LEVOTHYROXINE SODIUM 75 MCG PO TABS
75.0000 ug | ORAL_TABLET | Freq: Every day | ORAL | Status: DC
  Administered 2022-05-01 – 2022-05-02 (×2): 75 ug via ORAL
  Filled 2022-04-30 (×3): qty 1

## 2022-04-30 MED ORDER — METOPROLOL SUCCINATE ER 50 MG PO TB24
50.0000 mg | ORAL_TABLET | Freq: Every day | ORAL | Status: DC
  Administered 2022-04-30 – 2022-05-01 (×3): 50 mg via ORAL
  Filled 2022-04-30 (×3): qty 1

## 2022-04-30 MED ORDER — SODIUM CHLORIDE 0.9 % IV SOLN
INTRAVENOUS | Status: DC
Start: 2022-04-30 — End: 2022-05-01

## 2022-04-30 MED ORDER — TOPIRAMATE 100 MG PO TABS
100.0000 mg | ORAL_TABLET | Freq: Two times a day (BID) | ORAL | Status: DC
  Administered 2022-04-30 – 2022-05-02 (×5): 100 mg via ORAL
  Filled 2022-04-30 (×6): qty 1

## 2022-04-30 MED ORDER — SODIUM CHLORIDE 0.9 % IV SOLN
1.0000 g | INTRAVENOUS | Status: DC
  Administered 2022-04-30 – 2022-05-01 (×2): 1 g via INTRAVENOUS
  Filled 2022-04-30 (×2): qty 10

## 2022-04-30 MED ORDER — GABAPENTIN 400 MG PO CAPS
800.0000 mg | ORAL_CAPSULE | Freq: Every day | ORAL | Status: DC
  Administered 2022-04-30 (×2): 800 mg via ORAL
  Filled 2022-04-30 (×2): qty 2

## 2022-04-30 MED ORDER — LIDOCAINE 5 % EX PTCH
1.0000 | MEDICATED_PATCH | CUTANEOUS | Status: DC
  Administered 2022-04-30 – 2022-05-02 (×3): 1 via TRANSDERMAL
  Filled 2022-04-30 (×3): qty 1

## 2022-04-30 MED ORDER — POTASSIUM CHLORIDE 20 MEQ PO PACK
60.0000 meq | PACK | Freq: Once | ORAL | Status: AC
Start: 2022-04-30 — End: 2022-04-30
  Administered 2022-04-30: 60 meq via ORAL
  Filled 2022-04-30: qty 3

## 2022-04-30 MED ORDER — ASPIRIN 81 MG PO TBEC
81.0000 mg | DELAYED_RELEASE_TABLET | Freq: Every day | ORAL | Status: DC
  Administered 2022-04-30 – 2022-05-02 (×3): 81 mg via ORAL
  Filled 2022-04-30 (×3): qty 1

## 2022-04-30 MED ORDER — POTASSIUM CHLORIDE 20 MEQ PO PACK
40.0000 meq | PACK | Freq: Once | ORAL | Status: AC
  Administered 2022-04-30: 40 meq via ORAL
  Filled 2022-04-30: qty 2

## 2022-04-30 MED ORDER — CLONAZEPAM 0.5 MG PO TABS
0.5000 mg | ORAL_TABLET | Freq: Every day | ORAL | Status: DC
  Administered 2022-04-30 – 2022-05-01 (×3): 0.5 mg via ORAL
  Filled 2022-04-30 (×3): qty 1

## 2022-04-30 MED ORDER — BUPROPION HCL ER (XL) 300 MG PO TB24
300.0000 mg | ORAL_TABLET | Freq: Every day | ORAL | Status: DC
  Administered 2022-04-30 – 2022-05-02 (×3): 300 mg via ORAL
  Filled 2022-04-30 (×3): qty 1

## 2022-04-30 MED ORDER — ACETAMINOPHEN 325 MG PO TABS
650.0000 mg | ORAL_TABLET | Freq: Four times a day (QID) | ORAL | Status: DC | PRN
  Administered 2022-04-30 – 2022-05-01 (×3): 650 mg via ORAL
  Filled 2022-04-30 (×3): qty 2

## 2022-04-30 MED ORDER — RANOLAZINE ER 500 MG PO TB12
500.0000 mg | ORAL_TABLET | Freq: Two times a day (BID) | ORAL | Status: DC
  Administered 2022-04-30 – 2022-05-02 (×6): 500 mg via ORAL
  Filled 2022-04-30 (×6): qty 1

## 2022-04-30 MED ORDER — ONDANSETRON HCL 4 MG/2ML IJ SOLN
4.0000 mg | Freq: Four times a day (QID) | INTRAMUSCULAR | Status: DC | PRN
  Administered 2022-04-30: 4 mg via INTRAVENOUS
  Filled 2022-04-30: qty 2

## 2022-04-30 MED ORDER — INSULIN ASPART 100 UNIT/ML IJ SOLN: 0.0000 [IU] | Freq: Every day | INTRAMUSCULAR | Status: DC

## 2022-04-30 MED ORDER — HEPARIN SODIUM (PORCINE) 5000 UNIT/ML IJ SOLN
5000.0000 [IU] | Freq: Three times a day (TID) | INTRAMUSCULAR | Status: DC
  Administered 2022-04-30 – 2022-05-02 (×7): 5000 [IU] via SUBCUTANEOUS
  Filled 2022-04-30 (×7): qty 1

## 2022-04-30 MED ORDER — ACETAMINOPHEN 650 MG RE SUPP: 650.0000 mg | Freq: Four times a day (QID) | RECTAL | Status: DC | PRN

## 2022-04-30 MED ORDER — ONDANSETRON HCL 4 MG PO TABS: 4.0000 mg | ORAL_TABLET | Freq: Four times a day (QID) | ORAL | Status: DC | PRN

## 2022-04-30 MED ORDER — INSULIN ASPART 100 UNIT/ML IJ SOLN
0.0000 [IU] | Freq: Three times a day (TID) | INTRAMUSCULAR | Status: DC
  Administered 2022-04-30 (×2): 3 [IU] via SUBCUTANEOUS
  Administered 2022-04-30 – 2022-05-01 (×3): 2 [IU] via SUBCUTANEOUS
  Administered 2022-05-01: 5 [IU] via SUBCUTANEOUS
  Administered 2022-05-02: 3 [IU] via SUBCUTANEOUS

## 2022-04-30 MED ORDER — CLOPIDOGREL BISULFATE 75 MG PO TABS
75.0000 mg | ORAL_TABLET | Freq: Every day | ORAL | Status: DC
  Administered 2022-04-30 – 2022-05-02 (×3): 75 mg via ORAL
  Filled 2022-04-30 (×3): qty 1

## 2022-04-30 NOTE — Assessment & Plan Note (Signed)
Continue Synthroid °

## 2022-04-30 NOTE — Evaluation (Addendum)
Physical Therapy Evaluation Patient Details Name: Meghan Terry MRN: [id number] DOB: 1961/07/22 Today's Date: 04/30/2022  History of Present Illness  Meghan Terry is a 61 y.o. female with medical history significant of diastolic CHF, angina pectoris, anxiety, CKD stage IIIb, coronary artery disease, depression, diabetes mellitus, essential hypertension, ex-smoker, hypothyroidism, hypertension, restless leg syndrome, and more presents ED with a chief complaint of passing out.  Patient initially says she fell, and then reports that she does not remember any event and she thinks she passed out.  Patient reports last thing she remembers was trying to hold on to something as she was going down to the ground.  Then she remembers waking up disoriented, anxious, with the person standing over her telling her it would be okay.  Patient does not think she felt dizzy or flushed.  She denies chest pain or palpitations.  She denies shortness of breath.  She reports this changes in vision that she describes as the world around her expanding and then coming back towards her.  She reports it was a very strange feeling.  She has never had anything like this before.  Patient reports that the amount of time she was disoriented after she came to was a couple of minutes.  Patient does report increased stress levels in her day-to-day life.  She recently found out her 78-year-old granddaughter is diagnosed with a new illness.  She has been dealing with some chaos from moving.  She denies any panic attacks, fevers, dysuria, hematuria.  She does report increased urinary frequency.  This could be related to the increase in Lasix, but her UA is also suspicious for UTI.  She had a decrease in appetite, and reports that she has not been trying to counteract that as she wanted to lose weight anyways.  She has been nauseous for couple of months.  She takes Phenergan and Zofran at home and they both help.  Patient denies any other new meds.    Clinical Impression  Patient demonstrates good return for sitting up at bedside, poor tolerance for lying with left knee fully extended due to pain, unsteady on LLE with antalgic gait requiring use of RW for safety and tolerated walking in hallway without loss of balance before having to return to room due to increasing left knee pain.  Patient tolerated sitting up in chair after therapy.  Patient will benefit from continued skilled physical therapy in hospital and recommended venue below to increase strength, balance, endurance for safe ADLs and gait. Orthostatic BP's as follows lying 111/54, sitting 114/57 and standing 105/58, not symptomatic - nurse notified.         Recommendations for follow up therapy are one component of a multi-disciplinary discharge planning process, led by the attending physician.  Recommendations may be updated based on patient status, additional functional criteria and insurance authorization.  Follow Up Recommendations Home health PT      Assistance Recommended at Discharge Set up Supervision/Assistance  Patient can return home with the following  A little help with walking and/or transfers;A little help with bathing/dressing/bathroom;Assistance with cooking/housework;Help with stairs or ramp for entrance    Equipment Recommendations None recommended by PT  Recommendations for Other Services       Functional Status Assessment Patient has had a recent decline in their functional status and demonstrates the ability to make significant improvements in function in a reasonable and predictable amount of time.     Precautions / Restrictions Precautions Precautions: Fall Restrictions Weight Bearing  Restrictions: No      Mobility  Bed Mobility Overal bed mobility: Modified Independent                  Transfers Overall transfer level: Modified independent                      Ambulation/Gait Ambulation/Gait assistance: Supervision Gait  Distance (Feet): 40 Feet Assistive device: Rolling walker (2 wheels) Gait Pattern/deviations: Decreased step length - right, Decreased step length - left, Decreased stance time - left, Decreased stride length, Ataxic Gait velocity: decreased     General Gait Details: slow labord cadence with poor tolerance for weightbearing on LLE due to knee pain, required use of RW for safety demonstrating good return using RW without loss of balance, limited mostly due to increasing left knee pain  Stairs            Wheelchair Mobility    Modified Rankin (Stroke Patients Only)       Balance Overall balance assessment: Needs assistance Sitting-balance support: Feet supported, No upper extremity supported Sitting balance-Leahy Scale: Good Sitting balance - Comments: seated at EOB   Standing balance support: No upper extremity supported, During functional activity Standing balance-Leahy Scale: Fair Standing balance comment: without AD, fair/good using RW                             Pertinent Vitals/Pain Pain Assessment Pain Assessment: 0-10 Pain Score: 9  Pain Location: left knee Pain Descriptors / Indicators: Sore, Grimacing, Guarding Pain Intervention(s): Limited activity within patient's tolerance, Monitored during session, Premedicated before session, Repositioned    Home Living Family/patient expects to be discharged to:: Private residence Living Arrangements: Spouse/significant other;Other (Comment) (Patient takes care of spouse) Available Help at Discharge: Family;Available PRN/intermittently Type of Home: Apartment Home Access: Level entry       Home Layout: One level Home Equipment: None      Prior Function Prior Level of Function : History of Falls (last six months);Independent/Modified Independent             Mobility Comments: Hydrographic surveyor, drives, 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 ADLs Comments: Independent     Hand Dominance         Extremity/Trunk Assessment   Upper Extremity Assessment Upper Extremity Assessment: Overall WFL for tasks assessed    Lower Extremity Assessment Lower Extremity Assessment: Generalized weakness;LLE deficits/detail LLE Deficits / Details: grossly -4/5 LLE: Unable to fully assess due to pain LLE Sensation: WNL LLE Coordination: WNL    Cervical / Trunk Assessment Cervical / Trunk Assessment: Normal  Communication   Communication: No difficulties  Cognition Arousal/Alertness: Awake/alert Behavior During Therapy: WFL for tasks assessed/performed Overall Cognitive Status: Within Functional Limits for tasks assessed                                          General Comments      Exercises     Assessment/Plan    PT Assessment Patient needs continued PT services  PT Problem List Decreased strength;Decreased activity tolerance;Decreased balance;Decreased mobility;Pain       PT Treatment Interventions DME instruction;Gait training;Stair training;Functional mobility training;Therapeutic exercise;Therapeutic activities;Patient/family education;Balance training    PT Goals (Current goals can be found in the Care Plan section)  Acute Rehab PT Goals Patient Stated Goal: return  home with family to assist PT Goal Formulation: With patient Time For Goal Achievement: 05/03/22 Potential to Achieve Goals: Good    Frequency Min 3X/week     Co-evaluation               AM-PAC PT "6 Clicks" Mobility  Outcome Measure Help needed turning from your back to your side while in a flat bed without using bedrails?: None Help needed moving from lying on your back to sitting on the side of a flat bed without using bedrails?: None Help needed moving to and from a bed to a chair (including a wheelchair)?: A Little Help needed standing up from a chair using your arms (e.g., wheelchair or bedside chair)?: A Little Help needed to walk in hospital room?: A Little Help needed  climbing 3-5 steps with a railing? : A Little 6 Click Score: 20    End of Session   Activity Tolerance: Patient tolerated treatment well;Patient limited by pain Patient left: in chair;with call bell/phone within reach Nurse Communication: Mobility status PT Visit Diagnosis: Unsteadiness on feet (R26.81);Other abnormalities of gait and mobility (R26.89);Muscle weakness (generalized) (M62.81)    Time: 6433-2951 PT Time Calculation (min) (ACUTE ONLY): 31 min   Charges:   PT Evaluation $PT Eval Moderate Complexity: 1 Mod PT Treatments $Therapeutic Activity: 23-37 mins        11:27 AM, 04/30/22 Lonell Grandchild, MPT Physical Therapist with [facility] [phone] office [phone] mobile phone

## 2022-04-30 NOTE — Progress Notes (Signed)
*  PRELIMINARY RESULTS* Echocardiogram 2D Echocardiogram has been performed.  Meghan Terry 04/30/2022, 12:28 PM

## 2022-04-30 NOTE — Assessment & Plan Note (Addendum)
-   No signs of volume overload or acute CHF - briefly held lasix but resumed after she received IV fluids - - gave IV lasix on 8/4 with good results.  She diuresed well and back on home regimen of lasix now.  - Continue aspirin, metoprolol, Ranexa, Crestor - Monitor on telemetry

## 2022-04-30 NOTE — Plan of Care (Signed)
  Problem: Acute Rehab PT Goals(only PT should resolve) Goal: Pt Will Go Supine/Side To Sit Outcome: Progressing Flowsheets (Taken 04/30/2022 1130) Pt will go Supine/Side to Sit:  Independently  with modified independence Goal: Patient Will Transfer Sit To/From Stand Outcome: Progressing Flowsheets (Taken 04/30/2022 1130) Patient will transfer sit to/from stand: with modified independence Goal: Pt Will Transfer Bed To Chair/Chair To Bed Outcome: Progressing Flowsheets (Taken 04/30/2022 1130) Pt will Transfer Bed to Chair/Chair to Bed: with modified independence Goal: Pt Will Ambulate Outcome: Progressing Flowsheets (Taken 04/30/2022 1130) Pt will Ambulate:  100 feet  with modified independence  with rolling walker   11:30 AM, 04/30/22 Lonell Grandchild, MPT Physical Therapist with [facility] [phone] office [phone] mobile phone

## 2022-04-30 NOTE — Assessment & Plan Note (Addendum)
-   Likely secondary to dehydration and UTI which has been treated - Echo essentially unchanged, EF 60-65%  - monitored on telemetry which was stable  - Completed gentle IV hydration - Orthostatic vitals stable - PT evaluated and HH PT ordered / CXR stable

## 2022-04-30 NOTE — H&P (Signed)
History and Physical    Patient: Meghan Terry IDP:[id number] DOB: 1960/10/12 DOA: 04/29/2022 DOS: the patient was seen and examined on 04/30/2022 PCP: Garwin Brothers, MD  Patient coming from: Home  Chief Complaint:  Chief Complaint  Patient presents with   Loss of Consciousness   HPI: TYREE VANDRUFF is a 61 y.o. female with medical history significant of diastolic CHF, angina pectoris, anxiety, CKD stage IIIb, coronary artery disease, depression, diabetes mellitus, essential hypertension, ex-smoker, hypothyroidism, hypertension, restless leg syndrome, and more presents ED with a chief complaint of passing out.  Patient initially says she fell, and then reports that she does not remember any event and she thinks she passed out.  Patient reports last thing she remembers was trying to hold on to something as she was going down to the ground.  Then she remembers waking up disoriented, anxious, with the person standing over her telling her it would be okay.  Patient does not think she felt dizzy or flushed.  She denies chest pain or palpitations.  She denies shortness of breath.  She reports this changes in vision that she describes as the world around her expanding and then coming back towards her.  She reports it was a very strange feeling.  She has never had anything like this before.  Patient reports that the amount of time she was disoriented after she came to was a couple of minutes.  Patient does report increased stress levels in her day-to-day life.  She recently found out her 12-year-old granddaughter is diagnosed with a new illness.  She has been dealing with some chaos from moving.  She denies any panic attacks, fevers, dysuria, hematuria.  She does report increased urinary frequency.  This could be related to the increase in Lasix, but her UA is also suspicious for UTI.  She had a decrease in appetite, and reports that she has not been trying to counteract that as she wanted to lose weight anyways.  She has  been nauseous for couple of months.  She takes Phenergan and Zofran at home and they both help.  Patient denies any other new meds.  Patient currently uses vape, which she reports is less than 1 cigarette/day and the amount of nicotine that she uses.  She does not drink, does not use illicit drugs.  She is vaccinated for COVID.  Patient is DNR. Review of Systems: As mentioned in the history of present illness. All other systems reviewed and are negative. Past Medical History:  Diagnosis Date   Acute diastolic CHF (congestive heart failure) ([place]) 06/08/2021   Acute pulmonary edema (HCC)    Angina pectoris ([place]) 11/17/2019   Anxiety    Atypical chest pain 12/30/2020   CHF (congestive heart failure) ([place]) 06/08/2021   CKD (chronic kidney disease) stage 3b, GFR 30-59 ml/min 12/03/2019   Coronary artery disease    Coronary artery disease involving native coronary artery of native heart with angina pectoris ([place]) 10/03/2019   Depression 04/25/2017   Diabetes mellitus due to underlying condition with unspecified complications ([place]) 44/11/1538   Essential hypertension 09/05/2019   Ex-smoker 09/05/2019   Family history of coronary artery disease 09/05/2019   History of depression 04/15/2020   Hx of diabetes mellitus 04/15/2020   Hyperlipidemia with target LDL less than 70 11/24/2019   Hypertension    Hypothyroid    Migraine 04/25/2017   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   Thyroid disease    Past Surgical History:  Procedure Laterality Date   ABDOMINAL HYSTERECTOMY     APPENDECTOMY     CARDIAC CATHETERIZATION  11/22/2019   CHOLECYSTECTOMY     COLONOSCOPY     CORONARY ARTERY BYPASS GRAFT N/A 09/15/2019   Procedure: CORONARY  ARTERY BYPASS GRAFTING (CABG) times three on pump using left internal mammary artery and right and left greater saphenous veins harvested endoscopically;  Surgeon: Gaye Pollack, MD;  Location: [facility];  Service: Open Heart Surgery;  Laterality: N/A;   CORONARY STENT INTERVENTION N/A 11/23/2019   Procedure: CORONARY STENT INTERVENTION;  Surgeon: [name], MD;  Location: [facility];  Service: Cardiovascular;  Laterality: N/A;   KNEE ARTHROSCOPY     LEFT HEART CATH AND CORONARY ANGIOGRAPHY N/A 09/14/2019   Procedure: LEFT HEART CATH AND CORONARY ANGIOGRAPHY;  Surgeon: [name], MD;  Location: [facility];  Service: Cardiovascular;  Laterality: N/A;   LEFT HEART CATH AND CORS/GRAFTS ANGIOGRAPHY N/A 11/22/2019   Procedure: LEFT HEART CATH AND CORS/GRAFTS ANGIOGRAPHY;  Surgeon: [name], MD;  Location: [facility];  Service: Cardiovascular;  Laterality: N/A;   LEFT HEART CATH AND CORS/GRAFTS ANGIOGRAPHY N/A 03/13/2020   Procedure: LEFT HEART CATH AND CORS/GRAFTS ANGIOGRAPHY;  Surgeon: Lorretta Harp, MD;  Location: [facility];  Service: Cardiovascular;  Laterality: N/A;   LEFT HEART CATH AND CORS/GRAFTS ANGIOGRAPHY N/A 12/31/2020   Procedure: LEFT HEART CATH AND CORS/GRAFTS ANGIOGRAPHY;  Surgeon: Belva Crome, MD;  Location: [facility];  Service: Cardiovascular;  Laterality: N/A;   OSTEOCHONDRAL DEFECT REPAIR/RECONSTRUCTION Left 11/29/2014   Procedure: LEFT ANKLE MEDIAL MALLEOLUS OSTEOTOMY,AUTO GRAFT FROM CALCANEOUS;  OS TIBIA DENORO GRAFTING TALUS;  Surgeon: Wylene Simmer, MD;  Location: [facility];  Service: Orthopedics;  Laterality: Left;   TEE WITHOUT CARDIOVERSION N/A 09/15/2019   Procedure: TRANSESOPHAGEAL ECHOCARDIOGRAM (TEE);  Surgeon: Gaye Pollack, MD;  Location: [facility];  Service: Open Heart Surgery;  Laterality: N/A;   TUBAL LIGATION     Social History:  reports that she quit smoking about 2 years ago. Her smoking use  included cigarettes. She has a 47.00 pack-year smoking history. She has been exposed to tobacco smoke. She has never used smokeless tobacco. She reports that she does not drink alcohol and does not use drugs.  Allergies  Allergen Reactions   Carbidopa-Levodopa Other (See Comments)    Developed tics while taking    Prednisone Other (See Comments)    Turns red all over    Venlafaxine Other (See Comments)    Developed tics while taking - reaction to Effexor     Family History  Problem Relation Age of Onset   Asthma Mother    COPD Mother    Diabetes Mother    Macular degeneration Mother    Congestive Heart Failure Father     Prior to Admission medications   Medication Sig Start Date End Date Taking? Authorizing Provider  acetaminophen (TYLENOL) 500 MG tablet Take 2 tablets by mouth 3 times daily as needed for pain 12/08/21  Yes   aspirin EC 81 MG tablet Take 1 tablet (81 mg total) by mouth daily. 09/05/19  Yes Revankar, Reita Cliche, MD  buPROPion (  WELLBUTRIN XL) 300 MG 24 hr tablet Take 1 tablet by mouth daily 08/02/21  Yes   buPROPion (WELLBUTRIN XL) 300 MG 24 hr tablet Take 1 tablet by mouth daily. 05/07/17  Yes [provider]  cholecalciferol (VITAMIN D) 25 MCG (1000 UNIT) tablet Take 2 tablets by mouth every day. Patient taking differently: Take 1,000 Units by mouth 2 (two) times daily. 05/27/21  Yes   clonazePAM (KLONOPIN) 0.5 MG tablet Take 1 tablet (0.5 mg total) by mouth at bedtime. 01/07/22  Yes   clopidogrel (PLAVIX) 75 MG tablet TAKE 1 TABLET BY MOUTH DAILY 03/30/22  Yes Revankar, Reita Cliche, MD  dapagliflozin propanediol (FARXIGA) 10 MG TABS tablet Take 1 tablet (10 mg total) by mouth daily before breakfast. 09/25/21  Yes Revankar, Reita Cliche, MD  furosemide (LASIX) 40 MG tablet Take 2 tablets (80 mg total) by mouth 2 (two) times daily. Patient taking differently: Take 40 mg by mouth See admin instructions. Take 60 mg in the morning and 40 mg mid-day 04/09/22  Yes Daleen Bo,  MD  gabapentin (NEURONTIN) 800 MG tablet Take 1 tablet (800 mg) by mouth at bedtime for RLS 04/24/22  Yes   glimepiride (AMARYL) 1 MG tablet Take 1 tablet (1 mg) by mouth once daily 03/23/22  Yes   levothyroxine (SYNTHROID) 50 MCG tablet TAKE 1 TABLET BY MOUTH ONCE DAILY ON AN EMPTY STOMACH 30 MINUTES BEFORE BREAKFAST ON SAT AND SUNDAY 07/21/21  Yes   levothyroxine (SYNTHROID) 75 MCG tablet TAKE 1 TABLET BY MOUTH ONCE DAILY MONDAY THRU FRIDAY 06/13/21  Yes   metoprolol succinate (TOPROL-XL) 50 MG 24 hr tablet Take 1 tablet (50 mg total) by mouth at bedtime. Take with or immediately following a meal. 02/16/22  Yes Revankar, Reita Cliche, MD  nitroGLYCERIN (NITROSTAT) 0.4 MG SL tablet Place 1 tablet (0.4 mg total) under the tongue every 5 (five) minutes as needed for chest pain. 12/29/21  Yes Revankar, Reita Cliche, MD  ondansetron (ZOFRAN) 4 MG tablet take 1 tablet (4 mg) by mouth 2 times per day as needed for nausea 05/06/21  Yes   Potassium Chloride ER 20 MEQ TBCR Take 1 tablet by mouth 2 (two) times daily. 04/09/22  Yes Daleen Bo, MD  promethazine (PHENERGAN) 25 MG tablet take 1 tablet (25 mg) by mouth every 6 hours as needed for 30 days 07/21/21  Yes   ranolazine (RANEXA) 500 MG 12 hr tablet Take 1 tablet by mouth 2 times daily 12/25/21  Yes Revankar, Reita Cliche, MD  rosuvastatin (CRESTOR) 40 MG tablet take 1 tablet (40 mg) by mouth once daily for 90 days 07/21/21  Yes   topiramate (TOPAMAX) 100 MG tablet TAKE 1 TABLET BY MOUTH TWICE DAILY. 06/20/21  Yes   traMADol (ULTRAM) 50 MG tablet Take 1 tablet by mouth every 12 hours as needed for pain 03/23/22  Yes   clonazePAM (KLONOPIN) 0.5 MG tablet Take 1 tablet (0.5 mg) by mouth at bedtime Patient not taking: Reported on 04/29/2022 12/29/21     clonazePAM (KLONOPIN) 0.5 MG tablet Take 1 tablet (0.5 mg) by mouth at bedtime Patient not taking: Reported on 04/29/2022 03/23/22     gabapentin (NEURONTIN) 100 MG capsule Take 2 capsules by mouth at bedtime Patient not taking:  Reported on 04/29/2022 01/12/22     gabapentin (NEURONTIN) 800 MG tablet Take by mouth. Patient not taking: Reported on 04/29/2022 02/02/22   [provider]  potassium chloride SA (KLOR-CON M) 20 MEQ tablet Take 1 tablet by mouth  2 times daily for 5 days then decrease to 2 tablets daily Patient not taking: Reported on 04/29/2022 11/20/21     Semaglutide,0.25 or 0.5MG /DOS, (OZEMPIC, 0.25 OR 0.5 MG/DOSE,) 2 MG/3ML SOPN Inject 0.25 mg under the skin once weekly on the same day of each week for 28 days Patient not taking: Reported on 04/29/2022 03/23/22     traMADol (ULTRAM) 50 MG tablet Take 1 tablet by mouth once every 12 hours as needed for pain Patient not taking: Reported on 04/29/2022 01/17/22     traZODone (DESYREL) 150 MG tablet TAKE 1 TABLET BY MOUTH ONCE DAILY Patient not taking: Reported on 04/29/2022 05/26/21       Physical Exam: Vitals:   04/29/22 2200 04/29/22 2330 04/30/22 0019 04/30/22 0458  BP: 101/79 (!) 107/47 121/62 (!) 88/47  Pulse: 75 71 77 69  Resp: 14 17 16 17   Temp:   98.2 F (36.8 C) (!) 97.5 F (36.4 C)  TempSrc:      SpO2: 98% 93% 99% 97%  Weight:   83.9 kg   Height:   5\' 3"  (1.6 m)    1.  General: Patient lying supine in bed,  no acute distress   2. Psychiatric: Alert and oriented x 3, mood and behavior normal for situation, pleasant and cooperative with exam   3. Neurologic: Speech and language are normal, face is symmetric, moves all 4 extremities voluntarily, at baseline without acute deficits on limited exam   4. HEENMT:  Head is atraumatic, normocephalic, pupils reactive to light, neck is supple, trachea is midline, mucous membranes are moist   5. Respiratory : Lungs are clear to auscultation bilaterally without wheezing, rhonchi, rales, no cyanosis, no increase in work of breathing or accessory muscle use   6. Cardiovascular : Heart rate normal, rhythm is regular, no murmurs, rubs or gallops, no peripheral edema, peripheral pulses palpated   7.  Gastrointestinal:  Abdomen is soft, nondistended, nontender to palpation bowel sounds active, no masses or organomegaly palpated   8. Skin:  Skin is warm, dry and intact without rashes, acute lesions, or ulcers on limited exam   9.Musculoskeletal:  No acute deformities or trauma, no asymmetry in tone, no peripheral edema, peripheral pulses palpated, no tenderness to palpation in the extremities  Data Reviewed: In the ED Temp 98.6, heart rate 73-80, respiratory rate 13-22, blood pressure 101/53-120/79 No leukocytosis at 10.9, hemoglobin 9.9, platelets 295 Chemistry feels a hypokalemia 3.0, elevated BUN of 40, elevated creatinine at 3.4 X-ray knee is negative for acute fracture X-ray hip is negative for acute fracture EKG shows a heart rate of 77, sinus rhythm, QTc 458 UA shows 21-50 urine white blood cells Admission requested for work-up of syncope Assessment and Plan: * Syncope and collapse - Likely secondary to dehydration - Echo in the a.m. - Monitor on telemetry - Continue gentle IV hydration - Orthostatic vitals - Continue to monitor  AKI (acute kidney injury) ([place]) - Most likely secondary to dehydration - Creatinine up to 3.4 from 2.4 - Recently had increase in Lasix in the last few weeks -Due to increased stress has not been eating or drinking as much - Hold nephrotoxic agents when possible - Continue gentle IV hydration with careful monitoring of intake and output/volume status - Trend creatinine in the a.m.  Hypokalemia - Potassium down to 3.0 - Replace and recheck in a.m.  UTI (urinary tract infection) - Continue Rocephin - Urine culture pending - No previous urine culture to compare  CHF (congestive  heart failure) (HCC) - No signs of volume overload or acute CHF - Holding Lasix at this time given dehydration - Continue aspirin, metoprolol, Ranexa, Crestor - Monitor on telemetry  Hx of diabetes mellitus - Holding oral hypoglycemics - Monitor CBGs - Use  sliding scale correction insulin  Essential hypertension - Continue metoprolol - Continue to monitor  Hypothyroid - Continue Synthroid      Advance Care Planning:   Code Status: DNR   Consults: None  Family Communication: No family at bedside  Severity of Illness: The appropriate patient status for this patient is OBSERVATION. Observation status is judged to be reasonable and necessary in order to provide the required intensity of service to ensure the patient's safety. The patient's presenting symptoms, physical exam findings, and initial radiographic and laboratory data in the context of their medical condition is felt to place them at decreased risk for further clinical deterioration. Furthermore, it is anticipated that the patient will be medically stable for discharge from the hospital within 2 midnights of admission.   Author: Rolla Plate, DO 04/30/2022 5:52 AM  For on call review [email or web address].

## 2022-04-30 NOTE — Assessment & Plan Note (Addendum)
-   Most likely secondary to dehydration - Recently had increase in Lasix in the last few weeks due to heart failure symptoms.  -- Pt responded well to IV lasix given 8/4 and feeling better -Due to increased stress has not been eating or drinking as much - Hold nephrotoxic agents when possible - completed gentle IV hydration - creatinine 3.4 which is at baseline now for stage 4 CKD .

## 2022-04-30 NOTE — Assessment & Plan Note (Addendum)
-   planning to complete 3 full doses of IV ceftriaxone - Urine culture no growth

## 2022-04-30 NOTE — Assessment & Plan Note (Signed)
-   Continue metoprolol - Continue to monitor 

## 2022-04-30 NOTE — Assessment & Plan Note (Addendum)
-   resume home oral hypoglycemics - Monitor CBGs - Use sliding scale correction insulin CBG (last 3)  Recent Labs    04/30/22 2112 05/01/22 0718 05/01/22 1104  GLUCAP 149* 146* 140*

## 2022-04-30 NOTE — Progress Notes (Signed)
ASSUMPTION OF CARE NOTE   04/30/2022 1:16 PM  Meghan Terry was seen and examined.  The H&P by the admitting provider, orders, imaging was reviewed.  Please see new orders.  Will continue to follow.   Pt remains dehydrated and still needs IV fluid. Recheck labs in AM.  Possible home tomorrow.    Vitals:   04/30/22 0458 04/30/22 0757  BP: (!) 88/47 (!) 96/53  Pulse: 69 68  Resp: 17   Temp: (!) 97.5 F (36.4 C)   SpO2: 97% 97%    Results for orders placed or performed during the hospital encounter of 84/66/59  Basic metabolic panel  Result Value Ref Range   Sodium 137 135 - 145 mmol/L   Potassium 3.0 (L) 3.5 - 5.1 mmol/L   Chloride 104 98 - 111 mmol/L   CO2 26 22 - 32 mmol/L   Glucose, Bld 90 70 - 99 mg/dL   BUN 40 (H) 8 - 23 mg/dL   Creatinine, Ser 3.42 (H) 0.44 - 1.00 mg/dL   Calcium 8.3 (L) 8.9 - 10.3 mg/dL   GFR, Estimated 14 (L) >60 mL/min   Anion gap 7 5 - 15  CBC with Differential  Result Value Ref Range   WBC 10.9 (H) 4.0 - 10.5 K/uL   RBC 3.85 (L) 3.87 - 5.11 MIL/uL   Hemoglobin 9.9 (L) 12.0 - 15.0 g/dL   HCT 32.1 (L) 36.0 - 46.0 %   MCV 83.4 80.0 - 100.0 fL   MCH 25.7 (L) 26.0 - 34.0 pg   MCHC 30.8 30.0 - 36.0 g/dL   RDW 17.1 (H) 11.5 - 15.5 %   Platelets 295 150 - 400 K/uL   nRBC 0.0 0.0 - 0.2 %   Neutrophils Relative % 75 %   Neutro Abs 8.2 (H) 1.7 - 7.7 K/uL   Lymphocytes Relative 15 %   Lymphs Abs 1.7 0.7 - 4.0 K/uL   Monocytes Relative 6 %   Monocytes Absolute 0.6 0.1 - 1.0 K/uL   Eosinophils Relative 3 %   Eosinophils Absolute 0.3 0.0 - 0.5 K/uL   Basophils Relative 0 %   Basophils Absolute 0.0 0.0 - 0.1 K/uL   Immature Granulocytes 1 %   Abs Immature Granulocytes 0.08 (H) 0.00 - 0.07 K/uL  Urinalysis, Routine w reflex microscopic Urine, Clean Catch  Result Value Ref Range   Color, Urine YELLOW YELLOW   APPearance HAZY (A) CLEAR   Specific Gravity, Urine 1.007 1.005 - 1.030   pH 6.0 5.0 - 8.0   Glucose, UA >=500 (A) NEGATIVE mg/dL   Hgb urine  dipstick MODERATE (A) NEGATIVE   Bilirubin Urine NEGATIVE NEGATIVE   Ketones, ur NEGATIVE NEGATIVE mg/dL   Protein, ur 100 (A) NEGATIVE mg/dL   Nitrite NEGATIVE NEGATIVE   Leukocytes,Ua LARGE (A) NEGATIVE   RBC / HPF 0-5 0 - 5 RBC/hpf   WBC, UA 21-50 0 - 5 WBC/hpf   Bacteria, UA RARE (A) NONE SEEN   Squamous Epithelial / LPF 0-5 0 - 5   Hyaline Casts, UA PRESENT    Amorphous Crystal PRESENT   Hemoglobin A1c  Result Value Ref Range   Hgb A1c MFr Bld 7.0 (H) 4.8 - 5.6 %   Mean Plasma Glucose 154.2 mg/dL  Comprehensive metabolic panel  Result Value Ref Range   Sodium 139 135 - 145 mmol/L   Potassium 2.9 (L) 3.5 - 5.1 mmol/L   Chloride 105 98 - 111 mmol/L   CO2 26 22 - 32  mmol/L   Glucose, Bld 150 (H) 70 - 99 mg/dL   BUN 41 (H) 8 - 23 mg/dL   Creatinine, Ser 3.31 (H) 0.44 - 1.00 mg/dL   Calcium 8.4 (L) 8.9 - 10.3 mg/dL   Total Protein 6.6 6.5 - 8.1 g/dL   Albumin 3.4 (L) 3.5 - 5.0 g/dL   AST 15 15 - 41 U/L   ALT 11 0 - 44 U/L   Alkaline Phosphatase 66 38 - 126 U/L   Total Bilirubin 0.3 0.3 - 1.2 mg/dL   GFR, Estimated 15 (L) >60 mL/min   Anion gap 8 5 - 15  Magnesium  Result Value Ref Range   Magnesium 2.5 (H) 1.7 - 2.4 mg/dL  CBC with Differential/Platelet  Result Value Ref Range   WBC 8.1 4.0 - 10.5 K/uL   RBC 3.79 (L) 3.87 - 5.11 MIL/uL   Hemoglobin 9.7 (L) 12.0 - 15.0 g/dL   HCT 32.4 (L) 36.0 - 46.0 %   MCV 85.5 80.0 - 100.0 fL   MCH 25.6 (L) 26.0 - 34.0 pg   MCHC 29.9 (L) 30.0 - 36.0 g/dL   RDW 17.0 (H) 11.5 - 15.5 %   Platelets 280 150 - 400 K/uL   nRBC 0.0 0.0 - 0.2 %   Neutrophils Relative % 66 %   Neutro Abs 5.4 1.7 - 7.7 K/uL   Lymphocytes Relative 21 %   Lymphs Abs 1.7 0.7 - 4.0 K/uL   Monocytes Relative 7 %   Monocytes Absolute 0.6 0.1 - 1.0 K/uL   Eosinophils Relative 4 %   Eosinophils Absolute 0.3 0.0 - 0.5 K/uL   Basophils Relative 1 %   Basophils Absolute 0.1 0.0 - 0.1 K/uL   Immature Granulocytes 1 %   Abs Immature Granulocytes 0.05 0.00 - 0.07  K/uL  TSH  Result Value Ref Range   TSH 1.701 0.350 - 4.500 uIU/mL  Glucose, capillary  Result Value Ref Range   Glucose-Capillary 184 (H) 70 - 99 mg/dL  Glucose, capillary  Result Value Ref Range   Glucose-Capillary 136 (H) 70 - 99 mg/dL  Glucose, capillary  Result Value Ref Range   Glucose-Capillary 185 (H) 70 - 99 mg/dL  CBG monitoring, ED  Result Value Ref Range   Glucose-Capillary 88 70 - 99 mg/dL  ECHOCARDIOGRAM COMPLETE  Result Value Ref Range   Weight 2,959.46 oz   Height 63 in   BP 96/53 mmHg  Troponin I (High Sensitivity)  Result Value Ref Range   Troponin I (High Sensitivity) 5 <18 ng/L  Troponin I (High Sensitivity)  Result Value Ref Range   Troponin I (High Sensitivity) 5 <18 ng/L    C. Wynetta Emery, MD [facility]   04/29/2022  4:09 PM How to contact the [facility] Attending or Consulting provider [place] or covering provider during after hours [place], for this patient?  Check the care team in [facility] and look for a) attending/consulting [facility] provider listed and b) the [facility] team listed Log into [email or web address] and use [facility]'s universal password to access. If you do not have the password, please contact the hospital operator. Locate the [facility] provider you are looking for under [facility] and page to a number that you can be directly reached. If you still have difficulty reaching the provider, please page the [facility] (Director on Call) for the Hospitalists listed on amion for assistance.

## 2022-04-30 NOTE — Progress Notes (Signed)
Patient has only urinated once this shift- bladder scan shows 20mls.

## 2022-04-30 NOTE — TOC Progression Note (Signed)
  Transition of Care [facility]) Screening Note   Patient Details  Name: Meghan Terry Date of Birth: 09/01/1961   Transition of Care [facility]) CM/SW Contact:    Shade Flood, LCSW Phone Number: 04/30/2022, 12:51 PM    PT recommended HHPT for pt. Pt has [employer] fast streamer and there are no in-network [facility] providers in this area. Spoke with pt to review same and offered referral for outpatient PT. Pt states that she thinks she can manage on her own at home. She is aware that if she changes her mind, outpatient referral can be made prior to dc. Pt does not think she will have any TOC needs for dc.  Transition of Care Department [facility]) has reviewed patient and no TOC needs have been identified at this time. We will continue to monitor patient advancement through interdisciplinary progression rounds. If new patient transition needs arise, please place a TOC consult.

## 2022-04-30 NOTE — Assessment & Plan Note (Addendum)
-   This was repleted

## 2022-04-30 NOTE — Plan of Care (Signed)
  Problem: Education: Goal: Knowledge of condition and prescribed therapy will improve Outcome: Progressing   Problem: Cardiac: Goal: Will achieve and/or maintain adequate cardiac output Outcome: Progressing   Problem: Physical Regulation: Goal: Complications related to the disease process, condition or treatment will be avoided or minimized Outcome: Progressing   

## 2022-05-01 ENCOUNTER — Inpatient Hospital Stay (HOSPITAL_COMMUNITY): Payer: Commercial Managed Care - HMO

## 2022-05-01 ENCOUNTER — Other Ambulatory Visit (HOSPITAL_BASED_OUTPATIENT_CLINIC_OR_DEPARTMENT_OTHER): Payer: Self-pay

## 2022-05-01 DIAGNOSIS — R55 Syncope and collapse: Secondary | ICD-10-CM | POA: Diagnosis not present

## 2022-05-01 DIAGNOSIS — N184 Chronic kidney disease, stage 4 (severe): Secondary | ICD-10-CM | POA: Diagnosis not present

## 2022-05-01 DIAGNOSIS — I5032 Chronic diastolic (congestive) heart failure: Secondary | ICD-10-CM | POA: Diagnosis not present

## 2022-05-01 DIAGNOSIS — N179 Acute kidney failure, unspecified: Secondary | ICD-10-CM | POA: Diagnosis not present

## 2022-05-01 LAB — PROCALCITONIN: Procalcitonin: <0.1 ng/mL

## 2022-05-01 LAB — GLUCOSE, CAPILLARY
Glucose-Capillary: 146 mg/dL — ABNORMAL HIGH (ref 70–99)
Glucose-Capillary: 140 mg/dL — ABNORMAL HIGH (ref 70–99)
Glucose-Capillary: 203 mg/dL — ABNORMAL HIGH (ref 70–99)
Glucose-Capillary: 120 mg/dL — ABNORMAL HIGH (ref 70–99)

## 2022-05-01 LAB — BASIC METABOLIC PANEL
Anion gap: 5 (ref 5–15)
BUN: 39 mg/dL — ABNORMAL HIGH (ref 8–23)
CO2: 23 mmol/L (ref 22–32)
Calcium: 8.5 mg/dL — ABNORMAL LOW (ref 8.9–10.3)
Chloride: 111 mmol/L (ref 98–111)
Creatinine, Ser: 3.13 mg/dL — ABNORMAL HIGH (ref 0.44–1.00)
GFR, Estimated: 16 mL/min — ABNORMAL LOW (ref >60)
Glucose, Bld: 140 mg/dL — ABNORMAL HIGH (ref 70–99)
Potassium: 3.9 mmol/L (ref 3.5–5.1)
Sodium: 139 mmol/L (ref 135–145)

## 2022-05-01 LAB — URINE CULTURE: Culture: NO GROWTH

## 2022-05-01 LAB — MAGNESIUM: Magnesium: 2.5 mg/dL — ABNORMAL HIGH (ref 1.7–2.4)

## 2022-05-01 MED ORDER — IPRATROPIUM-ALBUTEROL 0.5-2.5 (3) MG/3ML IN SOLN
3.0000 mL | Freq: Two times a day (BID) | RESPIRATORY_TRACT | Status: DC
  Filled 2022-05-01: qty 3

## 2022-05-01 MED ORDER — IPRATROPIUM-ALBUTEROL 0.5-2.5 (3) MG/3ML IN SOLN
3.0000 mL | RESPIRATORY_TRACT | Status: DC
  Administered 2022-05-01 (×2): 3 mL via RESPIRATORY_TRACT
  Filled 2022-05-01 (×3): qty 3

## 2022-05-01 MED ORDER — FUROSEMIDE 20 MG PO TABS
60.0000 mg | ORAL_TABLET | Freq: Every morning | ORAL | Status: DC
  Administered 2022-05-02: 60 mg via ORAL
  Filled 2022-05-01: qty 1

## 2022-05-01 MED ORDER — GABAPENTIN 300 MG PO CAPS
600.0000 mg | ORAL_CAPSULE | Freq: Every day | ORAL | Status: DC
  Administered 2022-05-01: 600 mg via ORAL
  Filled 2022-05-01: qty 2

## 2022-05-01 MED ORDER — FUROSEMIDE 40 MG PO TABS
40.0000 mg | ORAL_TABLET | Freq: Every evening | ORAL | Status: DC
Start: 2022-05-02 — End: 2022-05-02

## 2022-05-01 MED ORDER — IPRATROPIUM-ALBUTEROL 0.5-2.5 (3) MG/3ML IN SOLN
3.0000 mL | Freq: Four times a day (QID) | RESPIRATORY_TRACT | Status: DC
  Administered 2022-05-01: 3 mL via RESPIRATORY_TRACT
  Filled 2022-05-01: qty 3

## 2022-05-01 MED ORDER — FUROSEMIDE 10 MG/ML IJ SOLN
40.0000 mg | Freq: Once | INTRAMUSCULAR | Status: AC
  Administered 2022-05-01: 40 mg via INTRAVENOUS
  Filled 2022-05-01: qty 4

## 2022-05-01 MED ORDER — IPRATROPIUM-ALBUTEROL 0.5-2.5 (3) MG/3ML IN SOLN: 3.0000 mL | Freq: Four times a day (QID) | RESPIRATORY_TRACT | Status: DC

## 2022-05-01 MED ORDER — ALBUTEROL SULFATE (2.5 MG/3ML) 0.083% IN NEBU
2.5000 mg | INHALATION_SOLUTION | Freq: Four times a day (QID) | RESPIRATORY_TRACT | Status: DC | PRN
Start: 2022-05-01 — End: 2022-05-02

## 2022-05-01 NOTE — Assessment & Plan Note (Addendum)
--  pt seems to be entering into stage IV CKD with GFR around 18-19.   --will need outpatient follow up with nephrology

## 2022-05-01 NOTE — Progress Notes (Signed)
Ok to reduce gabapentin to 600mg  qday due to renal function per Dr Tery Sanfilippo.  Onnie Boer, PharmD, BCIDP, AAHIVP, CPP Infectious Disease Pharmacist 05/01/2022 5:23 PM

## 2022-05-01 NOTE — Progress Notes (Signed)
PROGRESS NOTE   Meghan Terry  RWE:[id number] DOB: 1960-10-30 DOA: 04/29/2022 PCP: Garwin Brothers, MD   Chief Complaint  Patient presents with   Loss of Consciousness   Level of care: Telemetry  Brief Admission History:  No notes on file   Assessment and Plan: * Syncope and collapse - Likely secondary to dehydration and UTI - Echo essentially unchanged, EF 60-65%  - DC telemetry as this has been stable.  - Completed gentle IV hydration - Orthostatic vitals stable - PT evaluated and HH PT ordered / CXR stable   Chronic kidney disease (CKD), stage IV (severe) (HCC) --pt seems to be entering into stage IV CKD with GFR around 18-19.   --will need outpatient follow up with nephrology   AKI (acute kidney injury) ([place]) - Most likely secondary to dehydration - Creatinine up to 3.4 from 2.4 - Recently had increase in Lasix in the last few weeks due to heart failure -Due to increased stress has not been eating or drinking as much - Hold nephrotoxic agents when possible - completed gentle IV hydration - creatinine 3.4 which is at baseline now for stage 4 CKD .  Hypokalemia - This was repleted   UTI (urinary tract infection) - planning to complete 3 full doses of IV ceftriaxone - Urine culture no growth   CHF (congestive heart failure) (HCC) - No signs of volume overload or acute CHF - Holding Lasix at this time given dehydration - Continue aspirin, metoprolol, Ranexa, Crestor - Monitor on telemetry  Hx of diabetes mellitus - Holding oral hypoglycemics - Monitor CBGs - Use sliding scale correction insulin CBG (last 3)  Recent Labs    04/30/22 2112 05/01/22 0718 05/01/22 1104  GLUCAP 149* 146* 140*    Essential hypertension - Continue metoprolol - Continue to monitor  Hypothyroid - Continue Synthroid  DVT prophylaxis: SQ heparin Code Status: DNR  Family Communication:  Disposition: Status is: Inpatient Remains inpatient appropriate because: IV lasix given 8/4    Consultants:   Procedures:   Antimicrobials:    Subjective: Pt reporting that she is having a dry cough, no chest pain, no peripheral edema noted.  Having RLS symptoms at times.    Objective: Vitals:   04/30/22 1416 04/30/22 2053 05/01/22 0508 05/01/22 0934  BP: (!) 96/56 (!) 109/56 117/61   Pulse: 70 73 73   Resp: 18 20 14    Temp: 97.7 F (36.5 C) 98 F (36.7 C) 97.7 F (36.5 C)   TempSrc: Oral Oral Oral   SpO2: 96% 100% 97% 97%  Weight:      Height:        Intake/Output Summary (Last 24 hours) at 05/01/2022 1213 Last data filed at 05/01/2022 0900 Gross per 24 hour  Intake 2628.33 ml  Output 200 ml  Net 2428.33 ml   Filed Weights   04/29/22 1622 04/30/22 0019  Weight: 82.6 kg 83.9 kg   Examination:  General exam: lying supine in bed, Appears calm and comfortable  Respiratory system: Clear to auscultation. No crackles heard.  Respiratory effort normal. Cardiovascular system: normal S1 & S2 heard. No JVD, murmurs, rubs, gallops or clicks. No pedal edema. Gastrointestinal system: Abdomen is nondistended, soft and nontender. No organomegaly or masses felt. Normal bowel sounds heard. Central nervous system: Alert and oriented. No focal neurological deficits. Extremities: Symmetric 5 x 5 power. Skin: No rashes, lesions or ulcers. Psychiatry: Judgement and insight appear normal. Mood & affect appropriate.   Data Reviewed: I have personally reviewed following  labs and imaging studies  CBC: Recent Labs  Lab 04/29/22 1800 04/30/22 0512  WBC 10.9* 8.1  NEUTROABS 8.2* 5.4  HGB 9.9* 9.7*  HCT 32.1* 32.4*  MCV 83.4 85.5  PLT 295 938    Basic Metabolic Panel: Recent Labs  Lab 04/29/22 1800 04/30/22 0512 05/01/22 0525  NA 137 139 139  K 3.0* 2.9* 3.9  CL 104 105 111  CO2 26 26 23   GLUCOSE 90 150* 140*  BUN 40* 41* 39*  CREATININE 3.42* 3.31* 3.13*  CALCIUM 8.3* 8.4* 8.5*  MG  --  2.5* 2.5*    CBG: Recent Labs  Lab 04/30/22 1203 04/30/22 1628  04/30/22 2112 05/01/22 0718 05/01/22 1104  GLUCAP 185* 163* 149* 146* 140*    Recent Results (from the past 240 hour(s))  Urine Culture     Status: None   Collection Time: 04/29/22 10:04 PM   Specimen: Urine, Clean Catch  Result Value Ref Range Status   Specimen Description   Final    URINE, CLEAN CATCH Performed at [facility], [address], [place], [place] [address]    Special Requests   Final    NONE Performed at [facility], [address] [place], [place] [address]    Culture   Final    NO GROWTH Performed at [facility], [place] [address], [place], [place] [address]    Report Status 05/01/2022 FINAL  Final     Radiology Studies: DG CHEST PORT 1 VIEW  Result Date: 05/01/2022 CLINICAL DATA:  Syncope, dizziness, congestive heart failure. History of CABG. EXAM: PORTABLE CHEST 1 VIEW COMPARISON:  Chest two views 04/09/2022 and 12/12/2021; CT chest 11/08/2019 FINDINGS: Status post median sternotomy and CABG. Cardiac silhouette is mildly enlarged, unchanged from prior. Mild bilateral chronic interstitial thickening is unchanged likely mild interstitial lung disease. Bilateral lateral costophrenic angle increased density is unchanged from prior and relates to scarring seen on prior CT. No new focal airspace opacity. No pleural effusion or pneumothorax. No acute skeletal abnormality. IMPRESSION: No significant change from prior. Mild chronic bilateral interstitial thickening greatest within the peripheral lung bases. This may be chronic and may be secondary to very early chronic interstitial lung disease. Electronically Signed   By: Yvonne Kendall M.D.   On: 05/01/2022 08:13   ECHOCARDIOGRAM COMPLETE  Result Date: 04/30/2022    ECHOCARDIOGRAM REPORT   Patient Name:   Meghan Terry Date of Exam: 04/30/2022 Medical Rec #:  [id number]    Height:       63.0 in Accession #:    [id number]   Weight:       185.0 lb Date of Birth:  1961-09-20     BSA:          1.870 m Patient Age:    61 years      BP:           96/53 mmHg Patient Gender: F            HR:           68 bpm. Exam Location:  [facility] Procedure: 2D Echo, Color Doppler and Cardiac Doppler Indications:    Syncope  History:        Patient has prior history of Echocardiogram examinations, most                 recent 12/28/2021. CHF, CAD, Prior CABG, Signs/Symptoms:Chest Pain  and Syncope; Risk Factors:Hypertension, Diabetes, Dyslipidemia                 and Former Smoker.  Sonographer:    Wenda Low Referring Phys: [id number] ASIA B Matthews  1. Left ventricular ejection fraction, by estimation, is 60 to 65%. The left ventricle has normal function. The left ventricle has no regional wall motion abnormalities. Left ventricular diastolic parameters were normal.  2. Right ventricular systolic function is mildly reduced. The right ventricular size is mildly enlarged. There is normal pulmonary artery systolic pressure. The estimated right ventricular systolic pressure is 83.4 mmHg.  3. The mitral valve is grossly normal. Trivial mitral valve regurgitation.  4. The aortic valve is tricuspid. Aortic valve regurgitation is not visualized. Aortic valve sclerosis is present, with no evidence of aortic valve stenosis.  5. The inferior vena cava is normal in size with greater than 50% respiratory variability, suggesting right atrial pressure of 3 mmHg. Comparison(s): Compared to prior TTE in 12/2020, there is no significant change. FINDINGS  Left Ventricle: Left ventricular ejection fraction, by estimation, is 60 to 65%. The left ventricle has normal function. The left ventricle has no regional wall motion abnormalities. The left ventricular internal cavity size was normal in size. There is  no left ventricular hypertrophy. Abnormal (paradoxical) septal motion consistent with post-operative status. Left ventricular diastolic parameters were normal. Right Ventricle: The right ventricular size is mildly enlarged. Right vetricular  wall thickness was not well visualized. Right ventricular systolic function is mildly reduced. There is normal pulmonary artery systolic pressure. The tricuspid regurgitant velocity is 2.29 m/s, and with an assumed right atrial pressure of 3 mmHg, the estimated right ventricular systolic pressure is 19.6 mmHg. Left Atrium: Left atrial size was normal in size. Right Atrium: Right atrial size was normal in size. Pericardium: There is no evidence of pericardial effusion. Mitral Valve: The mitral valve is grossly normal. There is mild thickening of the mitral valve leaflet(s). There is mild calcification of the mitral valve leaflet(s). Mild mitral annular calcification. Trivial mitral valve regurgitation. MV peak gradient, 4.8 mmHg. The mean mitral valve gradient is 1.0 mmHg. Tricuspid Valve: The tricuspid valve is normal in structure. Tricuspid valve regurgitation is trivial. Aortic Valve: The aortic valve is tricuspid. Aortic valve regurgitation is not visualized. Aortic valve sclerosis is present, with no evidence of aortic valve stenosis. Aortic valve mean gradient measures 3.0 mmHg. Aortic valve peak gradient measures 6.2  mmHg. Aortic valve area, by VTI measures 2.20 cm. Pulmonic Valve: The pulmonic valve was normal in structure. Pulmonic valve regurgitation is trivial. Aorta: The aortic root is normal in size and structure. Venous: The inferior vena cava is normal in size with greater than 50% respiratory variability, suggesting right atrial pressure of 3 mmHg. IAS/Shunts: The atrial septum is grossly normal.  LEFT VENTRICLE PLAX 2D LVIDd:         5.10 cm     Diastology LVIDs:         3.10 cm     LV e' medial:    7.40 cm/s LV PW:         1.00 cm     LV E/e' medial:  11.5 LV IVS:        1.00 cm     LV e' lateral:   8.16 cm/s LVOT diam:     1.90 cm     LV E/e' lateral: 10.5 LV SV:         62 LV SV Index:  33 LVOT Area:     2.84 cm  LV Volumes (MOD) LV vol d, MOD A2C: 43.8 ml LV vol d, MOD A4C: 41.3 ml LV vol s,  MOD A2C: 20.6 ml LV vol s, MOD A4C: 18.0 ml LV SV MOD A2C:     23.2 ml LV SV MOD A4C:     41.3 ml LV SV MOD BP:      23.3 ml RIGHT VENTRICLE RV Basal diam:  3.30 cm RV Mid diam:    3.60 cm RV S prime:     7.51 cm/s TAPSE (M-mode): 1.9 cm LEFT ATRIUM             Index        RIGHT ATRIUM           Index LA diam:        3.80 cm 2.03 cm/m   RA Area:     15.80 cm LA Vol (A2C):   47.2 ml 25.23 ml/m  RA Volume:   46.40 ml  24.81 ml/m LA Vol (A4C):   41.0 ml 21.92 ml/m LA Biplane Vol: 47.9 ml 25.61 ml/m  AORTIC VALVE                    PULMONIC VALVE AV Area (Vmax):    2.04 cm     PV Vmax:       0.63 m/s AV Area (Vmean):   2.03 cm     PV Peak grad:  1.6 mmHg AV Area (VTI):     2.20 cm AV Vmax:           125.00 cm/s AV Vmean:          80.700 cm/s AV VTI:            0.280 m AV Peak Grad:      6.2 mmHg AV Mean Grad:      3.0 mmHg LVOT Vmax:         90.10 cm/s LVOT Vmean:        57.900 cm/s LVOT VTI:          0.217 m LVOT/AV VTI ratio: 0.77  AORTA Ao Root diam: 2.90 cm Ao Asc diam:  2.80 cm MITRAL VALVE               TRICUSPID VALVE MV Area (PHT): 3.60 cm    TR Peak grad:   21.0 mmHg MV Area VTI:   2.03 cm    TR Vmax:        229.00 cm/s MV Peak grad:  4.8 mmHg MV Mean grad:  1.0 mmHg    SHUNTS MV Vmax:       1.10 m/s    Systemic VTI:  0.22 m MV Vmean:      53.5 cm/s   Systemic Diam: 1.90 cm MV Decel Time: 211 msec MV E velocity: 85.30 cm/s MV A velocity: 62.60 cm/s MV E/A ratio:  1.36 Gwyndolyn Kaufman MD Electronically signed by Gwyndolyn Kaufman MD Signature Date/Time: 04/30/2022/1:29:11 PM    Final    US Carotid Bilateral  Result Date: 04/30/2022 CLINICAL DATA:  Syncope with collapse. EXAM: BILATERAL CAROTID DUPLEX ULTRASOUND TECHNIQUE: Pearline Cables scale imaging, color Doppler and duplex ultrasound were performed of bilateral carotid and vertebral arteries in the neck. COMPARISON:  None Available. FINDINGS: Criteria: Quantification of carotid stenosis is based on velocity parameters that correlate the residual internal  carotid diameter with NASCET-based stenosis levels, using the diameter of the distal internal carotid lumen as  the denominator for stenosis measurement. The following velocity measurements were obtained: RIGHT ICA: 112/33 cm/sec CCA: 29/92 cm/sec SYSTOLIC ICA/CCA RATIO:  1.3 ECA: 89 cm/sec LEFT ICA: 145/34 cm/sec CCA: 42/68 cm/sec SYSTOLIC ICA/CCA RATIO:  1.8 ECA: 119 cm/sec RIGHT CAROTID ARTERY: Echogenic plaque at the right carotid bulb. External carotid artery is patent with normal waveform. Echogenic plaque at the proximal internal carotid artery. Normal waveforms and velocities in the internal carotid artery. RIGHT VERTEBRAL ARTERY: Antegrade flow and normal waveform in the right vertebral artery. LEFT CAROTID ARTERY: Echogenic plaque at the left carotid bulb and internal carotid artery. External carotid artery is patent with normal waveform. Peak systolic velocity in the proximal internal carotid artery is slightly elevated measuring 145 cm/sec. LEFT VERTEBRAL ARTERY:  Not visualized IMPRESSION: 1. Atherosclerotic plaque at the carotid bulbs and proximal internal carotid arteries bilaterally. 2. Estimated degree of stenosis in the left internal carotid artery is 50-69%. 3. Estimated degree of stenosis in the right internal carotid artery is less than 50%. 4. Left vertebral artery is not visualized. Cannot exclude occlusion. 5. Right vertebral artery is patent with antegrade flow. Electronically Signed   By: Markus Daft M.D.   On: 04/30/2022 10:44   DG Knee Complete 4 Views Left  Result Date: 04/29/2022 CLINICAL DATA:  Left knee pain after fall. EXAM: LEFT KNEE - COMPLETE 4+ VIEW COMPARISON:  None Available. FINDINGS: No evidence of fracture, dislocation, or joint effusion. No evidence of arthropathy or other focal bone abnormality. Soft tissues are unremarkable. IMPRESSION: Negative. Electronically Signed   By: Titus Dubin M.D.   On: 04/29/2022 21:29   CT Head Wo Contrast  Result Date:  04/29/2022 CLINICAL DATA:  Syncope EXAM: CT HEAD WITHOUT CONTRAST CT CERVICAL SPINE WITHOUT CONTRAST TECHNIQUE: Multidetector CT imaging of the head and cervical spine was performed following the standard protocol without intravenous contrast. Multiplanar CT image reconstructions of the cervical spine were also generated. RADIATION DOSE REDUCTION: This exam was performed according to the departmental dose-optimization program which includes automated exposure control, adjustment of the mA and/or kV according to patient size and/or use of iterative reconstruction technique. COMPARISON:  MRI head 04/26/2017 FINDINGS: CT HEAD FINDINGS Brain: Mild atrophy. Mild white matter hypodensity bilaterally unchanged. Negative for acute infarct, hemorrhage, mass. Empty sella unchanged from the prior study Vascular: Negative for hyperdense vessel Skull: Negative for skull fracture. Mild scalp contusion right occipital parietal region. Sinuses/Orbits: Mild bony thickening of the right maxillary sinus. No significant mucosal edema in the sinuses. Mastoid clear. Negative orbit Other: None CT CERVICAL SPINE FINDINGS Alignment: Normal Skull base and vertebrae: Negative for fracture Soft tissues and spinal canal: Negative for soft tissue mass or adenopathy. Atherosclerotic calcification in the carotid and mild bilaterally. Disc levels: Disc degeneration and spurring most prominent at C4-5 and C5-6 mild spinal stenosis and moderate foraminal stenosis bilaterally at C5-6. Mild left foraminal narrowing at C4-5. Upper chest: Lung apices clear bilaterally. Other: None IMPRESSION: 1. No acute intracranial abnormality. Mild right occipital parietal scalp contusion. 2. Cervical spondylosis.  Negative for cervical fracture. Electronically Signed   By: Franchot Gallo M.D.   On: 04/29/2022 17:50   CT Cervical Spine Wo Contrast  Result Date: 04/29/2022 CLINICAL DATA:  Syncope EXAM: CT HEAD WITHOUT CONTRAST CT CERVICAL SPINE WITHOUT CONTRAST  TECHNIQUE: Multidetector CT imaging of the head and cervical spine was performed following the standard protocol without intravenous contrast. Multiplanar CT image reconstructions of the cervical spine were also generated. RADIATION DOSE REDUCTION: This exam was  performed according to the departmental dose-optimization program which includes automated exposure control, adjustment of the mA and/or kV according to patient size and/or use of iterative reconstruction technique. COMPARISON:  MRI head 04/26/2017 FINDINGS: CT HEAD FINDINGS Brain: Mild atrophy. Mild white matter hypodensity bilaterally unchanged. Negative for acute infarct, hemorrhage, mass. Empty sella unchanged from the prior study Vascular: Negative for hyperdense vessel Skull: Negative for skull fracture. Mild scalp contusion right occipital parietal region. Sinuses/Orbits: Mild bony thickening of the right maxillary sinus. No significant mucosal edema in the sinuses. Mastoid clear. Negative orbit Other: None CT CERVICAL SPINE FINDINGS Alignment: Normal Skull base and vertebrae: Negative for fracture Soft tissues and spinal canal: Negative for soft tissue mass or adenopathy. Atherosclerotic calcification in the carotid and mild bilaterally. Disc levels: Disc degeneration and spurring most prominent at C4-5 and C5-6 mild spinal stenosis and moderate foraminal stenosis bilaterally at C5-6. Mild left foraminal narrowing at C4-5. Upper chest: Lung apices clear bilaterally. Other: None IMPRESSION: 1. No acute intracranial abnormality. Mild right occipital parietal scalp contusion. 2. Cervical spondylosis.  Negative for cervical fracture. Electronically Signed   By: Franchot Gallo M.D.   On: 04/29/2022 17:50   DG Hip Unilat With Pelvis 2-3 Views Left  Result Date: 04/29/2022 CLINICAL DATA:  Fall due to dizziness. EXAM: DG HIP (WITH OR WITHOUT PELVIS) 2-3V LEFT COMPARISON:  06/02/2018 FINDINGS: Pelvis and left hip appear intact. No acute displaced fractures  identified. No focal bone lesion or bone destruction. Mild degenerative changes in the lower lumbar spine and hips. IMPRESSION: Mild degenerative changes.  No acute displaced fractures identified. Electronically Signed   By: Lucienne Capers M.D.   On: 04/29/2022 17:42    Scheduled Meds:  aspirin EC  81 mg Oral Daily   buPROPion  300 mg Oral Daily   clonazePAM  0.5 mg Oral QHS   clopidogrel  75 mg Oral Daily   [START ON 05/02/2022] furosemide  40 mg Oral QPM   [START ON 05/02/2022] furosemide  60 mg Oral q AM   gabapentin  800 mg Oral QHS   heparin  5,000 Units Subcutaneous Q8H   insulin aspart  0-15 Units Subcutaneous TID WC   insulin aspart  0-5 Units Subcutaneous QHS   ipratropium-albuterol  3 mL Nebulization Q4H   levothyroxine  75 mcg Oral Q0600   lidocaine  1 patch Transdermal Q24H   metoprolol succinate  50 mg Oral QHS   ranolazine  500 mg Oral BID   rosuvastatin  40 mg Oral Daily   topiramate  100 mg Oral BID   Continuous Infusions:     LOS: 1 day   Time spent: 38 mins  Amedeo Detweiler Wynetta Emery, MD How to contact the [facility] Attending or Consulting provider [place] or covering provider during after hours [place], for this patient?  Check the care team in [facility] and look for a) attending/consulting [facility] provider listed and b) the [facility] team listed Log into [email or web address] and use [place]'s universal password to access. If you do not have the password, please contact the hospital operator. Locate the [facility] provider you are looking for under [facility] and page to a number that you can be directly reached. If you still have difficulty reaching the provider, please page the [facility] (Director on Call) for the Hospitalists listed on amion for assistance.  05/01/2022, 12:13 PM

## 2022-05-01 NOTE — Plan of Care (Signed)
  Problem: Education: Goal: Knowledge of General Education information will improve Description: Including pain rating scale, medication(s)/side effects and non-pharmacologic comfort measures Outcome: Progressing   Problem: Health Behavior/Discharge Planning: Goal: Ability to manage health-related needs will improve Outcome: Progressing   Problem: Clinical Measurements: Goal: Ability to maintain clinical measurements within normal limits will improve Outcome: Progressing Goal: Will remain free from infection Outcome: Progressing Goal: Diagnostic test results will improve Outcome: Progressing Goal: Respiratory complications will improve Outcome: Progressing Goal: Cardiovascular complication will be avoided Outcome: Progressing   Problem: Activity: Goal: Risk for activity intolerance will decrease Outcome: Progressing   Problem: Nutrition: Goal: Adequate nutrition will be maintained Outcome: Progressing   Problem: Coping: Goal: Level of anxiety will decrease Outcome: Progressing   Problem: Elimination: Goal: Will not experience complications related to bowel motility Outcome: Progressing Goal: Will not experience complications related to urinary retention Outcome: Progressing   Problem: Pain Managment: Goal: General experience of comfort will improve Outcome: Progressing   Problem: Safety: Goal: Ability to remain free from injury will improve Outcome: Progressing   Problem: Skin Integrity: Goal: Risk for impaired skin integrity will decrease Outcome: Progressing   Problem: Education: Goal: Ability to describe self-care measures that may prevent or decrease complications (Diabetes Survival Skills Education) will improve Outcome: Progressing Goal: Individualized Educational Video(s) Outcome: Progressing   Problem: Coping: Goal: Ability to adjust to condition or change in health will improve Outcome: Progressing   Problem: Fluid Volume: Goal: Ability to  maintain a balanced intake and output will improve Outcome: Progressing   Problem: Health Behavior/Discharge Planning: Goal: Ability to identify and utilize available resources and services will improve Outcome: Progressing Goal: Ability to manage health-related needs will improve Outcome: Progressing   Problem: Metabolic: Goal: Ability to maintain appropriate glucose levels will improve Outcome: Progressing   Problem: Nutritional: Goal: Maintenance of adequate nutrition will improve Outcome: Progressing Goal: Progress toward achieving an optimal weight will improve Outcome: Progressing   Problem: Skin Integrity: Goal: Risk for impaired skin integrity will decrease Outcome: Progressing   Problem: Tissue Perfusion: Goal: Adequacy of tissue perfusion will improve Outcome: Progressing   Problem: Education: Goal: Knowledge of condition and prescribed therapy will improve Outcome: Progressing   Problem: Cardiac: Goal: Will achieve and/or maintain adequate cardiac output Outcome: Progressing   Problem: Physical Regulation: Goal: Complications related to the disease process, condition or treatment will be avoided or minimized Outcome: Progressing

## 2022-05-02 ENCOUNTER — Inpatient Hospital Stay (HOSPITAL_COMMUNITY): Payer: Commercial Managed Care - HMO

## 2022-05-02 DIAGNOSIS — I5032 Chronic diastolic (congestive) heart failure: Secondary | ICD-10-CM | POA: Diagnosis not present

## 2022-05-02 DIAGNOSIS — N184 Chronic kidney disease, stage 4 (severe): Secondary | ICD-10-CM | POA: Diagnosis not present

## 2022-05-02 DIAGNOSIS — N179 Acute kidney failure, unspecified: Secondary | ICD-10-CM | POA: Diagnosis not present

## 2022-05-02 DIAGNOSIS — R55 Syncope and collapse: Secondary | ICD-10-CM | POA: Diagnosis not present

## 2022-05-02 LAB — GLUCOSE, CAPILLARY: Glucose-Capillary: 156 mg/dL — ABNORMAL HIGH (ref 70–99)

## 2022-05-02 MED ORDER — IPRATROPIUM-ALBUTEROL 0.5-2.5 (3) MG/3ML IN SOLN: 3.0000 mL | RESPIRATORY_TRACT | Status: DC | PRN

## 2022-05-02 MED ORDER — GABAPENTIN 300 MG PO CAPS
600.0000 mg | ORAL_CAPSULE | Freq: Every day | ORAL | 0 refills | Status: DC
  Filled 2022-05-02: qty 60, 30d supply, fill #0

## 2022-05-02 MED ORDER — FUROSEMIDE 40 MG PO TABS: 40.0000 mg | ORAL_TABLET | ORAL | Status: DC

## 2022-05-02 MED ORDER — PREDNISONE 20 MG PO TABS
20.0000 mg | ORAL_TABLET | Freq: Once | ORAL | Status: DC
  Filled 2022-05-02: qty 1

## 2022-05-02 NOTE — Plan of Care (Signed)
  Problem: Education: Goal: Knowledge of General Education information will improve Description: Including pain rating scale, medication(s)/side effects and non-pharmacologic comfort measures Outcome: Adequate for Discharge   Problem: Health Behavior/Discharge Planning: Goal: Ability to manage health-related needs will improve Outcome: Adequate for Discharge   Problem: Clinical Measurements: Goal: Ability to maintain clinical measurements within normal limits will improve Outcome: Adequate for Discharge Goal: Will remain free from infection Outcome: Adequate for Discharge Goal: Diagnostic test results will improve Outcome: Adequate for Discharge Goal: Respiratory complications will improve Outcome: Adequate for Discharge Goal: Cardiovascular complication will be avoided Outcome: Adequate for Discharge   Problem: Activity: Goal: Risk for activity intolerance will decrease Outcome: Adequate for Discharge   Problem: Nutrition: Goal: Adequate nutrition will be maintained Outcome: Adequate for Discharge   Problem: Coping: Goal: Level of anxiety will decrease Outcome: Adequate for Discharge   Problem: Elimination: Goal: Will not experience complications related to bowel motility Outcome: Adequate for Discharge Goal: Will not experience complications related to urinary retention Outcome: Adequate for Discharge   Problem: Pain Managment: Goal: General experience of comfort will improve Outcome: Adequate for Discharge   Problem: Safety: Goal: Ability to remain free from injury will improve Outcome: Adequate for Discharge   Problem: Skin Integrity: Goal: Risk for impaired skin integrity will decrease Outcome: Adequate for Discharge   Problem: Education: Goal: Ability to describe self-care measures that may prevent or decrease complications (Diabetes Survival Skills Education) will improve Outcome: Adequate for Discharge Goal: Individualized Educational Video(s) Outcome:  Adequate for Discharge   Problem: Coping: Goal: Ability to adjust to condition or change in health will improve Outcome: Adequate for Discharge   Problem: Fluid Volume: Goal: Ability to maintain a balanced intake and output will improve Outcome: Adequate for Discharge   Problem: Health Behavior/Discharge Planning: Goal: Ability to identify and utilize available resources and services will improve Outcome: Adequate for Discharge Goal: Ability to manage health-related needs will improve Outcome: Adequate for Discharge   Problem: Metabolic: Goal: Ability to maintain appropriate glucose levels will improve Outcome: Adequate for Discharge   Problem: Nutritional: Goal: Maintenance of adequate nutrition will improve Outcome: Adequate for Discharge Goal: Progress toward achieving an optimal weight will improve Outcome: Adequate for Discharge   Problem: Skin Integrity: Goal: Risk for impaired skin integrity will decrease Outcome: Adequate for Discharge   Problem: Tissue Perfusion: Goal: Adequacy of tissue perfusion will improve Outcome: Adequate for Discharge   Problem: Education: Goal: Knowledge of condition and prescribed therapy will improve Outcome: Adequate for Discharge   Problem: Cardiac: Goal: Will achieve and/or maintain adequate cardiac output Outcome: Adequate for Discharge   Problem: Physical Regulation: Goal: Complications related to the disease process, condition or treatment will be avoided or minimized Outcome: Adequate for Discharge   

## 2022-05-02 NOTE — Discharge Summary (Signed)
Physician Discharge Summary  TALECIA SHERLIN RUE:[id number] DOB: August 25, 1961 DOA: 04/29/2022  PCP: Garwin Brothers, MD Cardiologist: Jenean Lindau, MD  Admit date: 04/29/2022 Discharge date: 05/02/2022  Admitted From: Home  Disposition: Home   Recommendations for Outpatient Follow-up:  Follow up with PCP in 1 weeks Follow up with cardiologist in 1-2 weeks Please check BMP in 1 week to follow up renal function, electrolytes   Home Health: PT   Discharge Condition: STABLE   CODE STATUS: DNR  DIET: heart healthy/carb modified/renal    Brief Hospitalization Summary: Please see all hospital notes, images, labs for full details of the hospitalization. ADMISSION HPI:   61 y.o. female with medical history significant of diastolic CHF, angina pectoris, anxiety, CKD stage IIIb, coronary artery disease, depression, diabetes mellitus, essential hypertension, ex-smoker, hypothyroidism, hypertension, restless leg syndrome, and more presents ED with a chief complaint of passing out.  Patient initially says she fell, and then reports that she does not remember any event and she thinks she passed out.  Patient reports last thing she remembers was trying to hold on to something as she was going down to the ground.  Then she remembers waking up disoriented, anxious, with the person standing over her telling her it would be okay.  Patient does not think she felt dizzy or flushed.  She denies chest pain or palpitations.  She denies shortness of breath.  She reports this changes in vision that she describes as the world around her expanding and then coming back towards her.  She reports it was a very strange feeling.  She has never had anything like this before.  Patient reports that the amount of time she was disoriented after she came to was a couple of minutes.  Patient does report increased stress levels in her day-to-day life.  She recently found out her 103-year-old granddaughter is diagnosed with a new illness.  She has  been dealing with some chaos from moving.  She denies any panic attacks, fevers, dysuria, hematuria.  She does report increased urinary frequency.  This could be related to the increase in Lasix, but her UA is also suspicious for UTI.  She had a decrease in appetite, and reports that she has not been trying to counteract that as she wanted to lose weight anyways.  She has been nauseous for couple of months.  She takes Phenergan and Zofran at home and they both help.  Patient denies any other new meds.   Patient currently uses vape, which she reports is less than 1 cigarette/day and the amount of nicotine that she uses.  She does not drink, does not use illicit drugs.  She is vaccinated for COVID.  Patient is DNR.  HOSPITAL COURSE BY PROBLEM   Assessment and Plan: * Syncope and collapse - Likely secondary to dehydration and UTI which has been treated - Echo essentially unchanged, EF 60-65%  - monitored on telemetry which was stable  - Completed gentle IV hydration - Orthostatic vitals stable - PT evaluated and HH PT ordered / CXR stable   Chronic kidney disease (CKD), stage IV (severe) (HCC) --pt seems to be entering into stage IV CKD with GFR around 18-19.   --will need outpatient follow up with nephrology   AKI on CKD IV - Most likely secondary to dehydration - Recently had increase in Lasix in the last few weeks due to heart failure symptoms.  -- Pt responded well to IV lasix given 8/4 and feeling better -Due to increased  stress has not been eating or drinking as much - Hold nephrotoxic agents when possible - completed gentle IV hydration - creatinine 3.4 which is at baseline now for stage 4 CKD .  Hypokalemia - This was repleted   UTI (urinary tract infection) - completed 3 full doses of IV ceftriaxone - Urine culture no growth   HFpEF - No signs of volume overload or acute CHF - briefly held lasix but resumed after she received IV fluids - - gave IV lasix on 8/4 with good  results.  She diuresed well and back on home regimen of lasix now.  - Continue aspirin, metoprolol, Ranexa, Crestor - Monitor on telemetry  Hx of diabetes mellitus - resume home oral hypoglycemics - Monitor CBGs - Use sliding scale correction insulin CBG (last 3)  Recent Labs    04/30/22 2112 05/01/22 0718 05/01/22 1104  GLUCAP 149* 146* 140*    Essential hypertension - Continue metoprolol - Continue to monitor  Hypothyroid - Continue Synthroid  Discharge Diagnoses:  Principal Problem:   Syncope and collapse Active Problems:   Hypothyroid   Essential hypertension   Hx of diabetes mellitus   CHF (congestive heart failure) (HCC)   UTI (urinary tract infection)   Hypokalemia   AKI (acute kidney injury) ([place])   Chronic kidney disease (CKD), stage IV (severe) ([place])   Discharge Instructions:  Allergies as of 05/02/2022       Reactions   Carbidopa-levodopa Other (See Comments)   Developed tics while taking   Prednisone Other (See Comments)   Turns red all over   Venlafaxine Other (See Comments)   Developed tics while taking - reaction to Effexor        Medication List     STOP taking these medications    gabapentin 100 MG capsule Commonly known as: NEURONTIN   gabapentin 800 MG tablet Commonly known as: NEURONTIN Replaced by: gabapentin 300 MG capsule   Ozempic (0.25 or 0.5 MG/DOSE) 2 MG/3ML Sopn Generic drug: Semaglutide(0.25 or 0.5MG /DOS)   potassium chloride SA 20 MEQ tablet Commonly known as: KLOR-CON M   traZODone 150 MG tablet Commonly known as: DESYREL       TAKE these medications    acetaminophen 500 MG tablet Commonly known as: TYLENOL Take 2 tablets by mouth 3 times daily as needed for pain   aspirin EC 81 MG tablet Take 1 tablet (81 mg total) by mouth daily.   buPROPion 300 MG 24 hr tablet Commonly known as: WELLBUTRIN XL Take 1 tablet by mouth daily What changed: Another medication with the same name was removed. Continue  taking this medication, and follow the directions you see here.   cholecalciferol 25 MCG (1000 UNIT) tablet Commonly known as: VITAMIN D3 Take 2 tablets by mouth every day. What changed:  how much to take when to take this   clonazePAM 0.5 MG tablet Commonly known as: KLONOPIN Take 1 tablet (0.5 mg total) by mouth at bedtime. What changed: Another medication with the same name was removed. Continue taking this medication, and follow the directions you see here.   clopidogrel 75 MG tablet Commonly known as: PLAVIX TAKE 1 TABLET BY MOUTH DAILY   Farxiga 10 MG Tabs tablet Generic drug: dapagliflozin propanediol Take 1 tablet (10 mg total) by mouth daily before breakfast.   furosemide 40 MG tablet Commonly known as: LASIX Take 1 tablet (40 mg total) by mouth See admin instructions. Take 60 mg in the morning and 40 mg mid-day  gabapentin 300 MG capsule Commonly known as: NEURONTIN Take 2 capsules (600 mg total) by mouth at bedtime. Replaces: gabapentin 800 MG tablet   glimepiride 1 MG tablet Commonly known as: AMARYL Take 1 tablet (1 mg) by mouth once daily   levothyroxine 75 MCG tablet Commonly known as: SYNTHROID TAKE 1 TABLET BY MOUTH ONCE DAILY MONDAY THRU FRIDAY What changed: Another medication with the same name was removed. Continue taking this medication, and follow the directions you see here.   metoprolol succinate 50 MG 24 hr tablet Commonly known as: TOPROL-XL Take 1 tablet (50 mg total) by mouth at bedtime. Take with or immediately following a meal.   nitroGLYCERIN 0.4 MG SL tablet Commonly known as: NITROSTAT Place 1 tablet (0.4 mg total) under the tongue every 5 (five) minutes as needed for chest pain.   ondansetron 4 MG tablet Commonly known as: ZOFRAN take 1 tablet (4 mg) by mouth 2 times per day as needed for nausea   Potassium Chloride ER 20 MEQ Tbcr Take 1 tablet by mouth 2 (two) times daily.   promethazine 25 MG tablet Commonly known as:  PHENERGAN take 1 tablet (25 mg) by mouth every 6 hours as needed for 30 days   ranolazine 500 MG 12 hr tablet Commonly known as: RANEXA Take 1 tablet by mouth 2 times daily   rosuvastatin 40 MG tablet Commonly known as: CRESTOR take 1 tablet (40 mg) by mouth once daily for 90 days   topiramate 100 MG tablet Commonly known as: TOPAMAX TAKE 1 TABLET BY MOUTH TWICE DAILY.   traMADol 50 MG tablet Commonly known as: ULTRAM Take 1 tablet by mouth every 12 hours as needed for pain What changed: Another medication with the same name was removed. Continue taking this medication, and follow the directions you see here.        Follow-up Information     Garwin Brothers, MD. Schedule an appointment as soon as possible for a visit in 1 week(s).   Specialty: Internal Medicine Why: Hospital Follow Up Contact information: [address] [place] [address] [phone]         RevankarReita Cliche, MD. Schedule an appointment as soon as possible for a visit in 1 week(s).   Specialty: Cardiology Why: Hospital Follow Up Contact information: [address] [place] [address] [phone]                Allergies  Allergen Reactions   Carbidopa-Levodopa Other (See Comments)    Developed tics while taking    Prednisone Other (See Comments)    Turns red all over    Venlafaxine Other (See Comments)    Developed tics while taking - reaction to Effexor    Allergies as of 05/02/2022       Reactions   Carbidopa-levodopa Other (See Comments)   Developed tics while taking   Prednisone Other (See Comments)   Turns red all over   Venlafaxine Other (See Comments)   Developed tics while taking - reaction to Effexor        Medication List     STOP taking these medications    gabapentin 100 MG capsule Commonly known as: NEURONTIN   gabapentin 800 MG tablet Commonly known as: NEURONTIN Replaced by: gabapentin 300 MG capsule   Ozempic (0.25 or 0.5 MG/DOSE) 2  MG/3ML Sopn Generic drug: Semaglutide(0.25 or 0.5MG /DOS)   potassium chloride SA 20 MEQ tablet Commonly known as: KLOR-CON M   traZODone 150  MG tablet Commonly known as: DESYREL       TAKE these medications    acetaminophen 500 MG tablet Commonly known as: TYLENOL Take 2 tablets by mouth 3 times daily as needed for pain   aspirin EC 81 MG tablet Take 1 tablet (81 mg total) by mouth daily.   buPROPion 300 MG 24 hr tablet Commonly known as: WELLBUTRIN XL Take 1 tablet by mouth daily What changed: Another medication with the same name was removed. Continue taking this medication, and follow the directions you see here.   cholecalciferol 25 MCG (1000 UNIT) tablet Commonly known as: VITAMIN D3 Take 2 tablets by mouth every day. What changed:  how much to take when to take this   clonazePAM 0.5 MG tablet Commonly known as: KLONOPIN Take 1 tablet (0.5 mg total) by mouth at bedtime. What changed: Another medication with the same name was removed. Continue taking this medication, and follow the directions you see here.   clopidogrel 75 MG tablet Commonly known as: PLAVIX TAKE 1 TABLET BY MOUTH DAILY   Farxiga 10 MG Tabs tablet Generic drug: dapagliflozin propanediol Take 1 tablet (10 mg total) by mouth daily before breakfast.   furosemide 40 MG tablet Commonly known as: LASIX Take 1 tablet (40 mg total) by mouth See admin instructions. Take 60 mg in the morning and 40 mg mid-day   gabapentin 300 MG capsule Commonly known as: NEURONTIN Take 2 capsules (600 mg total) by mouth at bedtime. Replaces: gabapentin 800 MG tablet   glimepiride 1 MG tablet Commonly known as: AMARYL Take 1 tablet (1 mg) by mouth once daily   levothyroxine 75 MCG tablet Commonly known as: SYNTHROID TAKE 1 TABLET BY MOUTH ONCE DAILY MONDAY THRU FRIDAY What changed: Another medication with the same name was removed. Continue taking this medication, and follow the directions you see here.    metoprolol succinate 50 MG 24 hr tablet Commonly known as: TOPROL-XL Take 1 tablet (50 mg total) by mouth at bedtime. Take with or immediately following a meal.   nitroGLYCERIN 0.4 MG SL tablet Commonly known as: NITROSTAT Place 1 tablet (0.4 mg total) under the tongue every 5 (five) minutes as needed for chest pain.   ondansetron 4 MG tablet Commonly known as: ZOFRAN take 1 tablet (4 mg) by mouth 2 times per day as needed for nausea   Potassium Chloride ER 20 MEQ Tbcr Take 1 tablet by mouth 2 (two) times daily.   promethazine 25 MG tablet Commonly known as: PHENERGAN take 1 tablet (25 mg) by mouth every 6 hours as needed for 30 days   ranolazine 500 MG 12 hr tablet Commonly known as: RANEXA Take 1 tablet by mouth 2 times daily   rosuvastatin 40 MG tablet Commonly known as: CRESTOR take 1 tablet (40 mg) by mouth once daily for 90 days   topiramate 100 MG tablet Commonly known as: TOPAMAX TAKE 1 TABLET BY MOUTH TWICE DAILY.   traMADol 50 MG tablet Commonly known as: ULTRAM Take 1 tablet by mouth every 12 hours as needed for pain What changed: Another medication with the same name was removed. Continue taking this medication, and follow the directions you see here.        Procedures/Studies: DG CHEST PORT 1 VIEW  Result Date: 05/02/2022 CLINICAL DATA:  Cough. EXAM: PORTABLE CHEST 1 VIEW COMPARISON:  05/01/2022 and older studies.  CT, 11/08/2019. FINDINGS: Chronic appearing interstitial thickening is stable. No lung consolidation. No convincing pleural effusion and no  pneumothorax. Cardiac silhouette normal in size. Stable changes from a prior median sternotomy. IMPRESSION: 1. No significant change from the previous day's study. No acute cardiopulmonary disease. Electronically Signed   By: Lajean Manes M.D.   On: 05/02/2022 09:17   DG CHEST PORT 1 VIEW  Result Date: 05/01/2022 CLINICAL DATA:  Syncope, dizziness, congestive heart failure. History of CABG. EXAM: PORTABLE  CHEST 1 VIEW COMPARISON:  Chest two views 04/09/2022 and 12/12/2021; CT chest 11/08/2019 FINDINGS: Status post median sternotomy and CABG. Cardiac silhouette is mildly enlarged, unchanged from prior. Mild bilateral chronic interstitial thickening is unchanged likely mild interstitial lung disease. Bilateral lateral costophrenic angle increased density is unchanged from prior and relates to scarring seen on prior CT. No new focal airspace opacity. No pleural effusion or pneumothorax. No acute skeletal abnormality. IMPRESSION: No significant change from prior. Mild chronic bilateral interstitial thickening greatest within the peripheral lung bases. This may be chronic and may be secondary to very early chronic interstitial lung disease. Electronically Signed   By: Yvonne Kendall M.D.   On: 05/01/2022 08:13   ECHOCARDIOGRAM COMPLETE  Result Date: 04/30/2022    ECHOCARDIOGRAM REPORT   Patient Name:   [name] Date of Exam: 04/30/2022 Medical Rec #:  [id number]    Height:       63.0 in Accession #:    [id number]   Weight:       185.0 lb Date of Birth:  14-Apr-1961     BSA:          1.870 m Patient Age:    61 years     BP:           96/53 mmHg Patient Gender: F            HR:           68 bpm. Exam Location:  [facility] Procedure: 2D Echo, Color Doppler and Cardiac Doppler Indications:    Syncope  History:        Patient has prior history of Echocardiogram examinations, most                 recent 12/28/2021. CHF, CAD, Prior CABG, Signs/Symptoms:Chest Pain                 and Syncope; Risk Factors:Hypertension, Diabetes, Dyslipidemia                 and Former Smoker.  Sonographer:    Wenda Low Referring Phys: [id number] ASIA B [place]  1. Left ventricular ejection fraction, by estimation, is 60 to 65%. The left ventricle has normal function. The left ventricle has no regional wall motion abnormalities. Left ventricular diastolic parameters were normal.  2. Right ventricular systolic function is mildly  reduced. The right ventricular size is mildly enlarged. There is normal pulmonary artery systolic pressure. The estimated right ventricular systolic pressure is 97.6 mmHg.  3. The mitral valve is grossly normal. Trivial mitral valve regurgitation.  4. The aortic valve is tricuspid. Aortic valve regurgitation is not visualized. Aortic valve sclerosis is present, with no evidence of aortic valve stenosis.  5. The inferior vena cava is normal in size with greater than 50% respiratory variability, suggesting right atrial pressure of 3 mmHg. Comparison(s): Compared to prior TTE in 12/2020, there is no significant change. FINDINGS  Left Ventricle: Left ventricular ejection fraction, by estimation, is 60 to 65%. The left ventricle has normal function. The left ventricle has no regional wall motion abnormalities. The left  ventricular internal cavity size was normal in size. There is  no left ventricular hypertrophy. Abnormal (paradoxical) septal motion consistent with post-operative status. Left ventricular diastolic parameters were normal. Right Ventricle: The right ventricular size is mildly enlarged. Right vetricular wall thickness was not well visualized. Right ventricular systolic function is mildly reduced. There is normal pulmonary artery systolic pressure. The tricuspid regurgitant velocity is 2.29 m/s, and with an assumed right atrial pressure of 3 mmHg, the estimated right ventricular systolic pressure is 29.9 mmHg. Left Atrium: Left atrial size was normal in size. Right Atrium: Right atrial size was normal in size. Pericardium: There is no evidence of pericardial effusion. Mitral Valve: The mitral valve is grossly normal. There is mild thickening of the mitral valve leaflet(s). There is mild calcification of the mitral valve leaflet(s). Mild mitral annular calcification. Trivial mitral valve regurgitation. MV peak gradient, 4.8 mmHg. The mean mitral valve gradient is 1.0 mmHg. Tricuspid Valve: The tricuspid valve  is normal in structure. Tricuspid valve regurgitation is trivial. Aortic Valve: The aortic valve is tricuspid. Aortic valve regurgitation is not visualized. Aortic valve sclerosis is present, with no evidence of aortic valve stenosis. Aortic valve mean gradient measures 3.0 mmHg. Aortic valve peak gradient measures 6.2  mmHg. Aortic valve area, by VTI measures 2.20 cm. Pulmonic Valve: The pulmonic valve was normal in structure. Pulmonic valve regurgitation is trivial. Aorta: The aortic root is normal in size and structure. Venous: The inferior vena cava is normal in size with greater than 50% respiratory variability, suggesting right atrial pressure of 3 mmHg. IAS/Shunts: The atrial septum is grossly normal.  LEFT VENTRICLE PLAX 2D LVIDd:         5.10 cm     Diastology LVIDs:         3.10 cm     LV e' medial:    7.40 cm/s LV PW:         1.00 cm     LV E/e' medial:  11.5 LV IVS:        1.00 cm     LV e' lateral:   8.16 cm/s LVOT diam:     1.90 cm     LV E/e' lateral: 10.5 LV SV:         62 LV SV Index:   33 LVOT Area:     2.84 cm  LV Volumes (MOD) LV vol d, MOD A2C: 43.8 ml LV vol d, MOD A4C: 41.3 ml LV vol s, MOD A2C: 20.6 ml LV vol s, MOD A4C: 18.0 ml LV SV MOD A2C:     23.2 ml LV SV MOD A4C:     41.3 ml LV SV MOD BP:      23.3 ml RIGHT VENTRICLE RV Basal diam:  3.30 cm RV Mid diam:    3.60 cm RV S prime:     7.51 cm/s TAPSE (M-mode): 1.9 cm LEFT ATRIUM             Index        RIGHT ATRIUM           Index LA diam:        3.80 cm 2.03 cm/m   RA Area:     15.80 cm LA Vol (A2C):   47.2 ml 25.23 ml/m  RA Volume:   46.40 ml  24.81 ml/m LA Vol (A4C):   41.0 ml 21.92 ml/m LA Biplane Vol: 47.9 ml 25.61 ml/m  AORTIC VALVE  PULMONIC VALVE AV Area (Vmax):    2.04 cm     PV Vmax:       0.63 m/s AV Area (Vmean):   2.03 cm     PV Peak grad:  1.6 mmHg AV Area (VTI):     2.20 cm AV Vmax:           125.00 cm/s AV Vmean:          80.700 cm/s AV VTI:            0.280 m AV Peak Grad:      6.2 mmHg AV Mean  Grad:      3.0 mmHg LVOT Vmax:         90.10 cm/s LVOT Vmean:        57.900 cm/s LVOT VTI:          0.217 m LVOT/AV VTI ratio: 0.77  AORTA Ao Root diam: 2.90 cm Ao Asc diam:  2.80 cm MITRAL VALVE               TRICUSPID VALVE MV Area (PHT): 3.60 cm    TR Peak grad:   21.0 mmHg MV Area VTI:   2.03 cm    TR Vmax:        229.00 cm/s MV Peak grad:  4.8 mmHg MV Mean grad:  1.0 mmHg    SHUNTS MV Vmax:       1.10 m/s    Systemic VTI:  0.22 m MV Vmean:      53.5 cm/s   Systemic Diam: 1.90 cm MV Decel Time: 211 msec MV E velocity: 85.30 cm/s MV A velocity: 62.60 cm/s MV E/A ratio:  1.36 Gwyndolyn Kaufman MD Electronically signed by Gwyndolyn Kaufman MD Signature Date/Time: 04/30/2022/1:29:11 PM    Final    US Carotid Bilateral  Result Date: 04/30/2022 CLINICAL DATA:  Syncope with collapse. EXAM: BILATERAL CAROTID DUPLEX ULTRASOUND TECHNIQUE: Pearline Cables scale imaging, color Doppler and duplex ultrasound were performed of bilateral carotid and vertebral arteries in the neck. COMPARISON:  None Available. FINDINGS: Criteria: Quantification of carotid stenosis is based on velocity parameters that correlate the residual internal carotid diameter with NASCET-based stenosis levels, using the diameter of the distal internal carotid lumen as the denominator for stenosis measurement. The following velocity measurements were obtained: RIGHT ICA: 112/33 cm/sec CCA: 40/34 cm/sec SYSTOLIC ICA/CCA RATIO:  1.3 ECA: 89 cm/sec LEFT ICA: 145/34 cm/sec CCA: 74/25 cm/sec SYSTOLIC ICA/CCA RATIO:  1.8 ECA: 119 cm/sec RIGHT CAROTID ARTERY: Echogenic plaque at the right carotid bulb. External carotid artery is patent with normal waveform. Echogenic plaque at the proximal internal carotid artery. Normal waveforms and velocities in the internal carotid artery. RIGHT VERTEBRAL ARTERY: Antegrade flow and normal waveform in the right vertebral artery. LEFT CAROTID ARTERY: Echogenic plaque at the left carotid bulb and internal carotid artery. External carotid  artery is patent with normal waveform. Peak systolic velocity in the proximal internal carotid artery is slightly elevated measuring 145 cm/sec. LEFT VERTEBRAL ARTERY:  Not visualized IMPRESSION: 1. Atherosclerotic plaque at the carotid bulbs and proximal internal carotid arteries bilaterally. 2. Estimated degree of stenosis in the left internal carotid artery is 50-69%. 3. Estimated degree of stenosis in the right internal carotid artery is less than 50%. 4. Left vertebral artery is not visualized. Cannot exclude occlusion. 5. Right vertebral artery is patent with antegrade flow. Electronically Signed   By: Markus Daft M.D.   On: 04/30/2022 10:44   DG Knee Complete 4 Views Left  Result Date: 04/29/2022 CLINICAL DATA:  Left knee pain after fall. EXAM: LEFT KNEE - COMPLETE 4+ VIEW COMPARISON:  None Available. FINDINGS: No evidence of fracture, dislocation, or joint effusion. No evidence of arthropathy or other focal bone abnormality. Soft tissues are unremarkable. IMPRESSION: Negative. Electronically Signed   By: Titus Dubin M.D.   On: 04/29/2022 21:29   CT Head Wo Contrast  Result Date: 04/29/2022 CLINICAL DATA:  Syncope EXAM: CT HEAD WITHOUT CONTRAST CT CERVICAL SPINE WITHOUT CONTRAST TECHNIQUE: Multidetector CT imaging of the head and cervical spine was performed following the standard protocol without intravenous contrast. Multiplanar CT image reconstructions of the cervical spine were also generated. RADIATION DOSE REDUCTION: This exam was performed according to the departmental dose-optimization program which includes automated exposure control, adjustment of the mA and/or kV according to patient size and/or use of iterative reconstruction technique. COMPARISON:  MRI head 04/26/2017 FINDINGS: CT HEAD FINDINGS Brain: Mild atrophy. Mild white matter hypodensity bilaterally unchanged. Negative for acute infarct, hemorrhage, mass. Empty sella unchanged from the prior study Vascular: Negative for hyperdense  vessel Skull: Negative for skull fracture. Mild scalp contusion right occipital parietal region. Sinuses/Orbits: Mild bony thickening of the right maxillary sinus. No significant mucosal edema in the sinuses. Mastoid clear. Negative orbit Other: None CT CERVICAL SPINE FINDINGS Alignment: Normal Skull base and vertebrae: Negative for fracture Soft tissues and spinal canal: Negative for soft tissue mass or adenopathy. Atherosclerotic calcification in the carotid and mild bilaterally. Disc levels: Disc degeneration and spurring most prominent at C4-5 and C5-6 mild spinal stenosis and moderate foraminal stenosis bilaterally at C5-6. Mild left foraminal narrowing at C4-5. Upper chest: Lung apices clear bilaterally. Other: None IMPRESSION: 1. No acute intracranial abnormality. Mild right occipital parietal scalp contusion. 2. Cervical spondylosis.  Negative for cervical fracture. Electronically Signed   By: Franchot Gallo M.D.   On: 04/29/2022 17:50   CT Cervical Spine Wo Contrast  Result Date: 04/29/2022 CLINICAL DATA:  Syncope EXAM: CT HEAD WITHOUT CONTRAST CT CERVICAL SPINE WITHOUT CONTRAST TECHNIQUE: Multidetector CT imaging of the head and cervical spine was performed following the standard protocol without intravenous contrast. Multiplanar CT image reconstructions of the cervical spine were also generated. RADIATION DOSE REDUCTION: This exam was performed according to the departmental dose-optimization program which includes automated exposure control, adjustment of the mA and/or kV according to patient size and/or use of iterative reconstruction technique. COMPARISON:  MRI head 04/26/2017 FINDINGS: CT HEAD FINDINGS Brain: Mild atrophy. Mild white matter hypodensity bilaterally unchanged. Negative for acute infarct, hemorrhage, mass. Empty sella unchanged from the prior study Vascular: Negative for hyperdense vessel Skull: Negative for skull fracture. Mild scalp contusion right occipital parietal region.  Sinuses/Orbits: Mild bony thickening of the right maxillary sinus. No significant mucosal edema in the sinuses. Mastoid clear. Negative orbit Other: None CT CERVICAL SPINE FINDINGS Alignment: Normal Skull base and vertebrae: Negative for fracture Soft tissues and spinal canal: Negative for soft tissue mass or adenopathy. Atherosclerotic calcification in the carotid and mild bilaterally. Disc levels: Disc degeneration and spurring most prominent at C4-5 and C5-6 mild spinal stenosis and moderate foraminal stenosis bilaterally at C5-6. Mild left foraminal narrowing at C4-5. Upper chest: Lung apices clear bilaterally. Other: None IMPRESSION: 1. No acute intracranial abnormality. Mild right occipital parietal scalp contusion. 2. Cervical spondylosis.  Negative for cervical fracture. Electronically Signed   By: Franchot Gallo M.D.   On: 04/29/2022 17:50   DG Hip Unilat With Pelvis 2-3 Views Left  Result Date: 04/29/2022 CLINICAL  DATA:  Fall due to dizziness. EXAM: DG HIP (WITH OR WITHOUT PELVIS) 2-3V LEFT COMPARISON:  06/02/2018 FINDINGS: Pelvis and left hip appear intact. No acute displaced fractures identified. No focal bone lesion or bone destruction. Mild degenerative changes in the lower lumbar spine and hips. IMPRESSION: Mild degenerative changes.  No acute displaced fractures identified. Electronically Signed   By: Lucienne Capers M.D.   On: 04/29/2022 17:42   DG Chest 2 View  Result Date: 04/09/2022 CLINICAL DATA:  Prior median sternotomy. Diabetes. Shortness of breath. EXAM: CHEST - 2 VIEW COMPARISON:  12/12/2021 FINDINGS: Mild hyperinflation. Median sternotomy for CABG. Cholecystectomy. Midline trachea. Normal heart size and mediastinal contours. No pleural effusion or pneumothorax. Peripheral and basilar interstitial prominence. No lobar consolidation. IMPRESSION: Peripheral and basilar interstitial prominence, favored to represent septal thickening in the setting of mild interstitial edema.  Electronically Signed   By: Abigail Miyamoto M.D.   On: 04/09/2022 14:56     Subjective: Pt having some knee pain from fall but not willing to take prednisone.  Will follow up with PCP.   Discharge Exam: Vitals:   05/01/22 2041 05/02/22 0409  BP: (!) 106/51 (!) 104/51  Pulse: 82 77  Resp: 14 14  Temp: 98.5 F (36.9 C) 98.9 F (37.2 C)  SpO2: 98% 97%   Vitals:   05/01/22 1352 05/01/22 2004 05/01/22 2041 05/02/22 0409  BP:   (!) 106/51 (!) 104/51  Pulse:   82 77  Resp:   14 14  Temp:   98.5 F (36.9 C) 98.9 F (37.2 C)  TempSrc:      SpO2: 100% 97% 98% 97%  Weight:      Height:        General: Pt is alert, awake, not in acute distress Cardiovascular: RRR, S1/S2 +, no rubs, no gallops Respiratory: CTA bilaterally, no wheezing, no rhonchi Abdominal: Soft, NT, ND, bowel sounds + Extremities: no edema, no cyanosis   The results of significant diagnostics from this hospitalization (including imaging, microbiology, ancillary and laboratory) are listed below for reference.     Microbiology: Recent Results (from the past 240 hour(s))  Urine Culture     Status: None   Collection Time: 04/29/22 10:04 PM   Specimen: Urine, Clean Catch  Result Value Ref Range Status   Specimen Description   Final    URINE, CLEAN CATCH Performed at [facility], [address] [place], [place] [address]    Special Requests   Final    NONE Performed at [facility], [address], [place], [place] [address]    Culture   Final    NO GROWTH Performed at [facility], [place] [address] [place], [place] [address]    Report Status 05/01/2022 FINAL  Final     Labs: BNP (last 3 results) Recent Labs    08/29/21 1050 12/12/21 1828 04/09/22 1430  BNP 132.5* 57.0 235.3*   Basic Metabolic Panel: Recent Labs  Lab 04/29/22 1800 04/30/22 0512 05/01/22 0525  NA 137 139 139  K 3.0* 2.9* 3.9  CL 104 105 111  CO2 26 26 23   GLUCOSE 90 150* 140*  BUN 40* 41* 39*  CREATININE 3.42* 3.31*  3.13*  CALCIUM 8.3* 8.4* 8.5*  MG  --  2.5* 2.5*   Liver Function Tests: Recent Labs  Lab 04/30/22 0512  AST 15  ALT 11  ALKPHOS 66  BILITOT 0.3  PROT 6.6  ALBUMIN 3.4*   No results for input(s): "LIPASE", "AMYLASE" in  the last 168 hours. No results for input(s): "AMMONIA" in the last 168 hours. CBC: Recent Labs  Lab 04/29/22 1800 04/30/22 0512  WBC 10.9* 8.1  NEUTROABS 8.2* 5.4  HGB 9.9* 9.7*  HCT 32.1* 32.4*  MCV 83.4 85.5  PLT 295 280   Cardiac Enzymes: No results for input(s): "CKTOTAL", "CKMB", "CKMBINDEX", "TROPONINI" in the last 168 hours. BNP: Invalid input(s): "POCBNP" CBG: Recent Labs  Lab 05/01/22 0718 05/01/22 1104 05/01/22 1611 05/01/22 2042 05/02/22 0802  GLUCAP 146* 140* 203* 120* 156*   D-Dimer No results for input(s): "DDIMER" in the last 72 hours. Hgb A1c Recent Labs    04/30/22 0512  HGBA1C 7.0*   Lipid Profile No results for input(s): "CHOL", "HDL", "LDLCALC", "TRIG", "CHOLHDL", "LDLDIRECT" in the last 72 hours. Thyroid function studies Recent Labs    04/30/22 0512  TSH 1.701   Anemia work up No results for input(s): "VITAMINB12", "FOLATE", "FERRITIN", "TIBC", "IRON", "RETICCTPCT" in the last 72 hours. Urinalysis    Component Value Date/Time   COLORURINE YELLOW 04/29/2022 1707   APPEARANCEUR HAZY (A) 04/29/2022 1707   LABSPEC 1.007 04/29/2022 1707   PHURINE 6.0 04/29/2022 1707   GLUCOSEU >=500 (A) 04/29/2022 1707   HGBUR MODERATE (A) 04/29/2022 1707   BILIRUBINUR NEGATIVE 04/29/2022 1707   KETONESUR NEGATIVE 04/29/2022 1707   PROTEINUR 100 (A) 04/29/2022 1707   NITRITE NEGATIVE 04/29/2022 1707   LEUKOCYTESUR LARGE (A) 04/29/2022 1707   Sepsis Labs Recent Labs  Lab 04/29/22 1800 04/30/22 0512  WBC 10.9* 8.1   Microbiology Recent Results (from the past 240 hour(s))  Urine Culture     Status: None   Collection Time: 04/29/22 10:04 PM   Specimen: Urine, Clean Catch  Result Value Ref Range Status   Specimen  Description   Final    URINE, CLEAN CATCH Performed at [facility], [address] [place], [place] [address]    Special Requests   Final    NONE Performed at [facility], [address], [place], [place] [address]    Culture   Final    NO GROWTH Performed at [facility], [place] [address] [place], [place] [address]    Report Status 05/01/2022 FINAL  Final    Time coordinating discharge: 36 mins   SIGNED:  Irwin Brakeman, MD  [facility] 05/02/2022, 11:20 AM How to contact the [facility] Attending or Consulting provider [place] or covering provider during after hours [place], for this patient?  Check the care team in [facility] and look for a) attending/consulting [facility] provider listed and b) the [facility] team listed Log into [email or web address] and use [place]'s universal password to access. If you do not have the password, please contact the hospital operator. Locate the [facility] provider you are looking for under [facility] and page to a number that you can be directly reached. If you still have difficulty reaching the provider, please page the [facility] (Director on Call) for the Hospitalists listed on amion for assistance.

## 2022-05-02 NOTE — Plan of Care (Signed)
  Problem: Pain Managment: Goal: General experience of comfort will improve Outcome: Progressing   Problem: Safety: Goal: Ability to remain free from injury will improve Outcome: Progressing   Problem: Skin Integrity: Goal: Risk for impaired skin integrity will decrease Outcome: Progressing   

## 2022-05-02 NOTE — Progress Notes (Signed)
Patient very concerned about new order for prednisone. States she has an allergic reaction of facial swelling and a generalized red rash all over her body. Irwin Brakeman, MD aware and okay to not give medication.

## 2022-05-02 NOTE — Discharge Instructions (Signed)
IMPORTANT INFORMATION: PAY CLOSE ATTENTION   PHYSICIAN DISCHARGE INSTRUCTIONS  Follow with Primary care provider  Garwin Brothers, MD  and other consultants as instructed by your Hospitalist Physician  [place] IF SYMPTOMS COME BACK, WORSEN OR NEW PROBLEM DEVELOPS   Please note: You were cared for by a hospitalist during your hospital stay. Every effort will be made to forward records to your primary care provider.  You can request that your primary care provider send for your hospital records if they have not received them.  Once you are discharged, your primary care physician will handle any further medical issues. Please note that NO REFILLS for any discharge medications will be authorized once you are discharged, as it is imperative that you return to your primary care physician (or establish a relationship with a primary care physician if you do not have one) for your post hospital discharge needs so that they can reassess your need for medications and monitor your lab values.  Please get a complete blood count and chemistry panel checked by your Primary MD at your next visit, and again as instructed by your Primary MD.  Get Medicines reviewed and adjusted: Please take all your medications with you for your next visit with your Primary MD  Laboratory/radiological data: Please request your Primary MD to go over all hospital tests and procedure/radiological results at the follow up, please ask your primary care provider to get all Hospital records sent to his/her office.  In some cases, they will be blood work, cultures and biopsy results pending at the time of your discharge. Please request that your primary care provider follow up on these results.  If you are diabetic, please bring your blood sugar readings with you to your follow up appointment with primary care.    Please call and make your follow up appointments as soon as possible.    Also Note the  following: If you experience worsening of your admission symptoms, develop shortness of breath, life threatening emergency, suicidal or homicidal thoughts you must seek medical attention immediately by calling 911 or calling your MD immediately  if symptoms less severe.  You must read complete instructions/literature along with all the possible adverse reactions/side effects for all the Medicines you take and that have been prescribed to you. Take any new Medicines after you have completely understood and accpet all the possible adverse reactions/side effects.   Do not drive when taking Pain medications or sleeping medications (Benzodiazepines)  Do not take more than prescribed Pain, Sleep and Anxiety Medications. It is not advisable to combine anxiety,sleep and pain medications without talking with your primary care practitioner  Special Instructions: If you have smoked or chewed Tobacco  in the last 2 yrs please stop smoking, stop any regular Alcohol  and or any Recreational drug use.  Wear Seat belts while driving.  Do not drive if taking any narcotic, mind altering or controlled substances or recreational drugs or alcohol.

## 2022-05-04 ENCOUNTER — Other Ambulatory Visit (HOSPITAL_BASED_OUTPATIENT_CLINIC_OR_DEPARTMENT_OTHER): Payer: Self-pay

## 2022-05-12 ENCOUNTER — Other Ambulatory Visit (HOSPITAL_COMMUNITY): Payer: Self-pay | Admitting: Cardiology

## 2022-05-12 ENCOUNTER — Other Ambulatory Visit (HOSPITAL_BASED_OUTPATIENT_CLINIC_OR_DEPARTMENT_OTHER): Payer: Self-pay

## 2022-05-12 MED ORDER — TOPIRAMATE 100 MG PO TABS
100.0000 mg | ORAL_TABLET | Freq: Two times a day (BID) | ORAL | 3 refills | Status: AC
  Filled 2022-05-12 – 2022-07-06 (×2): qty 180, 90d supply, fill #0
  Filled 2022-07-14 – 2022-10-14 (×2): qty 180, 90d supply, fill #1

## 2022-05-12 MED ORDER — ONDANSETRON HCL 4 MG PO TABS
ORAL_TABLET | ORAL | 1 refills | Status: AC
  Filled 2022-05-12: qty 180, 90d supply, fill #0
  Filled 2022-06-28 – 2022-08-07 (×4): qty 180, 90d supply, fill #1

## 2022-05-12 MED ORDER — DAPAGLIFLOZIN PROPANEDIOL 10 MG PO TABS
10.0000 mg | ORAL_TABLET | Freq: Every day | ORAL | 8 refills | Status: DC
  Filled 2022-05-12 – 2022-06-16 (×2): qty 30, 30d supply, fill #0
  Filled 2022-07-06: qty 30, 30d supply, fill #1
  Filled 2022-07-14 – 2022-08-16 (×2): qty 30, 30d supply, fill #2
  Filled 2022-09-11: qty 30, 30d supply, fill #3
  Filled 2022-10-09: qty 30, 30d supply, fill #4
  Filled 2022-11-12: qty 30, 30d supply, fill #5

## 2022-05-12 NOTE — Telephone Encounter (Signed)
Rx refill sent to pharmacy. 

## 2022-05-22 ENCOUNTER — Other Ambulatory Visit: Payer: Self-pay | Admitting: Specialist

## 2022-05-22 ENCOUNTER — Other Ambulatory Visit: Payer: Self-pay | Admitting: Hospitalist

## 2022-05-22 DIAGNOSIS — Z87891 Personal history of nicotine dependence: Secondary | ICD-10-CM

## 2022-05-25 ENCOUNTER — Other Ambulatory Visit (HOSPITAL_BASED_OUTPATIENT_CLINIC_OR_DEPARTMENT_OTHER): Payer: Self-pay

## 2022-05-27 ENCOUNTER — Other Ambulatory Visit (HOSPITAL_BASED_OUTPATIENT_CLINIC_OR_DEPARTMENT_OTHER): Payer: Self-pay

## 2022-05-28 ENCOUNTER — Other Ambulatory Visit (HOSPITAL_BASED_OUTPATIENT_CLINIC_OR_DEPARTMENT_OTHER): Payer: Self-pay

## 2022-05-28 MED ORDER — TRAZODONE HCL 150 MG PO TABS
150.0000 mg | ORAL_TABLET | Freq: Every day | ORAL | 3 refills | Status: DC
  Filled 2022-05-28: qty 90, 90d supply, fill #0
  Filled 2022-06-28 – 2022-08-15 (×4): qty 90, 90d supply, fill #1

## 2022-05-28 MED ORDER — LEVOTHYROXINE SODIUM 50 MCG PO TABS
ORAL_TABLET | ORAL | 3 refills | Status: DC
  Filled 2022-05-28: qty 24, 90d supply, fill #0
  Filled 2022-06-28: qty 24, 84d supply, fill #0
  Filled 2022-07-14 – 2022-09-17 (×2): qty 24, 84d supply, fill #1

## 2022-06-02 ENCOUNTER — Other Ambulatory Visit (HOSPITAL_BASED_OUTPATIENT_CLINIC_OR_DEPARTMENT_OTHER): Payer: Self-pay

## 2022-06-03 ENCOUNTER — Other Ambulatory Visit (HOSPITAL_BASED_OUTPATIENT_CLINIC_OR_DEPARTMENT_OTHER): Payer: Self-pay

## 2022-06-05 ENCOUNTER — Other Ambulatory Visit (HOSPITAL_BASED_OUTPATIENT_CLINIC_OR_DEPARTMENT_OTHER): Payer: Self-pay

## 2022-06-05 MED ORDER — TRAZODONE HCL 50 MG PO TABS
50.0000 mg | ORAL_TABLET | Freq: Every day | ORAL | 1 refills | Status: DC
  Filled 2022-06-05: qty 90, 90d supply, fill #0
  Filled 2022-06-28 – 2022-08-18 (×5): qty 90, 90d supply, fill #1

## 2022-06-05 MED ORDER — ROPINIROLE HCL 4 MG PO TABS
4.0000 mg | ORAL_TABLET | Freq: Every day | ORAL | 0 refills | Status: DC
  Filled 2022-06-05: qty 90, 90d supply, fill #0

## 2022-06-05 MED ORDER — ROPINIROLE HCL 1 MG PO TABS
1.0000 mg | ORAL_TABLET | Freq: Three times a day (TID) | ORAL | 1 refills | Status: DC
  Filled 2022-06-05: qty 270, 90d supply, fill #0
  Filled 2022-06-28 – 2022-08-18 (×6): qty 270, 90d supply, fill #1

## 2022-06-08 ENCOUNTER — Other Ambulatory Visit (HOSPITAL_BASED_OUTPATIENT_CLINIC_OR_DEPARTMENT_OTHER): Payer: Self-pay

## 2022-06-08 MED ORDER — FUROSEMIDE 40 MG PO TABS
60.0000 mg | ORAL_TABLET | Freq: Two times a day (BID) | ORAL | 0 refills | Status: DC
  Filled 2022-06-08: qty 270, 90d supply, fill #0

## 2022-06-08 MED ORDER — LEVOTHYROXINE SODIUM 75 MCG PO TABS
75.0000 ug | ORAL_TABLET | Freq: Every day | ORAL | 3 refills | Status: AC
  Filled 2022-06-08: qty 60, 84d supply, fill #0
  Filled 2022-06-16: qty 60, 60d supply, fill #0
  Filled 2022-06-28 – 2022-07-06 (×2): qty 60, 84d supply, fill #0
  Filled 2022-07-07: qty 60, 60d supply, fill #0
  Filled 2022-07-14 – 2022-09-08 (×5): qty 60, 60d supply, fill #1
  Filled 2022-11-02: qty 60, 60d supply, fill #2

## 2022-06-08 MED ORDER — OZEMPIC (0.25 OR 0.5 MG/DOSE) 2 MG/3ML ~~LOC~~ SOPN
0.5000 mg | PEN_INJECTOR | SUBCUTANEOUS | 6 refills | Status: DC
  Filled 2022-06-08: qty 3, 28d supply, fill #0

## 2022-06-09 ENCOUNTER — Other Ambulatory Visit (HOSPITAL_BASED_OUTPATIENT_CLINIC_OR_DEPARTMENT_OTHER): Payer: Self-pay

## 2022-06-11 ENCOUNTER — Other Ambulatory Visit (HOSPITAL_BASED_OUTPATIENT_CLINIC_OR_DEPARTMENT_OTHER): Payer: Self-pay

## 2022-06-12 ENCOUNTER — Other Ambulatory Visit (HOSPITAL_BASED_OUTPATIENT_CLINIC_OR_DEPARTMENT_OTHER): Payer: Self-pay

## 2022-06-16 ENCOUNTER — Ambulatory Visit
Admission: RE | Admit: 2022-06-16 | Discharge: 2022-06-16 | Disposition: A | Discharge status: Home / Self Care | Payer: Commercial Managed Care - HMO | Source: Ambulatory Visit | Attending: Specialist | Admitting: Specialist

## 2022-06-16 ENCOUNTER — Other Ambulatory Visit (HOSPITAL_BASED_OUTPATIENT_CLINIC_OR_DEPARTMENT_OTHER): Payer: Self-pay

## 2022-06-16 DIAGNOSIS — Z87891 Personal history of nicotine dependence: Secondary | ICD-10-CM

## 2022-06-18 ENCOUNTER — Other Ambulatory Visit (HOSPITAL_BASED_OUTPATIENT_CLINIC_OR_DEPARTMENT_OTHER): Payer: Self-pay

## 2022-06-19 ENCOUNTER — Other Ambulatory Visit (HOSPITAL_BASED_OUTPATIENT_CLINIC_OR_DEPARTMENT_OTHER): Payer: Self-pay

## 2022-06-22 ENCOUNTER — Other Ambulatory Visit (HOSPITAL_BASED_OUTPATIENT_CLINIC_OR_DEPARTMENT_OTHER): Payer: Self-pay

## 2022-06-23 ENCOUNTER — Other Ambulatory Visit (HOSPITAL_BASED_OUTPATIENT_CLINIC_OR_DEPARTMENT_OTHER): Payer: Self-pay

## 2022-06-24 ENCOUNTER — Other Ambulatory Visit (HOSPITAL_BASED_OUTPATIENT_CLINIC_OR_DEPARTMENT_OTHER): Payer: Self-pay

## 2022-06-25 ENCOUNTER — Other Ambulatory Visit (HOSPITAL_BASED_OUTPATIENT_CLINIC_OR_DEPARTMENT_OTHER): Payer: Self-pay

## 2022-06-28 ENCOUNTER — Other Ambulatory Visit (HOSPITAL_BASED_OUTPATIENT_CLINIC_OR_DEPARTMENT_OTHER): Payer: Self-pay | Admitting: Emergency Medicine

## 2022-06-28 ENCOUNTER — Other Ambulatory Visit (HOSPITAL_BASED_OUTPATIENT_CLINIC_OR_DEPARTMENT_OTHER): Payer: Self-pay

## 2022-06-29 ENCOUNTER — Other Ambulatory Visit (HOSPITAL_BASED_OUTPATIENT_CLINIC_OR_DEPARTMENT_OTHER): Payer: Self-pay

## 2022-06-29 MED ORDER — ROPINIROLE HCL 4 MG PO TABS
4.0000 mg | ORAL_TABLET | Freq: Every day | ORAL | 0 refills | Status: AC
  Filled 2022-06-29 – 2022-08-27 (×5): qty 90, 90d supply, fill #0

## 2022-06-30 ENCOUNTER — Other Ambulatory Visit (HOSPITAL_BASED_OUTPATIENT_CLINIC_OR_DEPARTMENT_OTHER): Payer: Self-pay

## 2022-07-01 ENCOUNTER — Other Ambulatory Visit (HOSPITAL_BASED_OUTPATIENT_CLINIC_OR_DEPARTMENT_OTHER): Payer: Self-pay

## 2022-07-02 ENCOUNTER — Other Ambulatory Visit (HOSPITAL_BASED_OUTPATIENT_CLINIC_OR_DEPARTMENT_OTHER): Payer: Self-pay

## 2022-07-06 ENCOUNTER — Other Ambulatory Visit (HOSPITAL_BASED_OUTPATIENT_CLINIC_OR_DEPARTMENT_OTHER): Payer: Self-pay

## 2022-07-06 MED ORDER — FUROSEMIDE 40 MG PO TABS
60.0000 mg | ORAL_TABLET | Freq: Two times a day (BID) | ORAL | 0 refills | Status: DC
  Filled 2022-07-06 – 2022-09-17 (×4): qty 270, 90d supply, fill #0

## 2022-07-06 MED ORDER — BUPROPION HCL ER (XL) 300 MG PO TB24
300.0000 mg | ORAL_TABLET | Freq: Every day | ORAL | 2 refills | Status: AC
Start: 2022-07-06 — End: ?
  Filled 2022-07-06 – 2022-08-10 (×7): qty 90, 90d supply, fill #0
  Filled 2022-11-02: qty 90, 90d supply, fill #1

## 2022-07-06 MED ORDER — TRULICITY 1.5 MG/0.5ML ~~LOC~~ SOAJ
1.5000 mg | SUBCUTANEOUS | 3 refills | Status: DC
  Filled 2022-07-06: qty 2, 28d supply, fill #0
  Filled 2022-07-30: qty 2, 28d supply, fill #1
  Filled 2022-08-27: qty 2, 28d supply, fill #2
  Filled 2022-09-24: qty 2, 28d supply, fill #3

## 2022-07-07 ENCOUNTER — Other Ambulatory Visit (HOSPITAL_BASED_OUTPATIENT_CLINIC_OR_DEPARTMENT_OTHER): Payer: Self-pay

## 2022-07-08 ENCOUNTER — Other Ambulatory Visit (HOSPITAL_BASED_OUTPATIENT_CLINIC_OR_DEPARTMENT_OTHER): Payer: Self-pay

## 2022-07-08 MED ORDER — TRAMADOL HCL 50 MG PO TABS
50.0000 mg | ORAL_TABLET | Freq: Two times a day (BID) | ORAL | 3 refills | Status: DC | PRN
  Filled 2022-07-30: qty 30, 15d supply, fill #0

## 2022-07-14 ENCOUNTER — Other Ambulatory Visit (HOSPITAL_BASED_OUTPATIENT_CLINIC_OR_DEPARTMENT_OTHER): Payer: Self-pay | Admitting: Emergency Medicine

## 2022-07-14 ENCOUNTER — Other Ambulatory Visit (HOSPITAL_BASED_OUTPATIENT_CLINIC_OR_DEPARTMENT_OTHER): Payer: Self-pay

## 2022-07-15 ENCOUNTER — Other Ambulatory Visit (HOSPITAL_BASED_OUTPATIENT_CLINIC_OR_DEPARTMENT_OTHER): Payer: Self-pay

## 2022-07-16 ENCOUNTER — Other Ambulatory Visit (HOSPITAL_BASED_OUTPATIENT_CLINIC_OR_DEPARTMENT_OTHER): Payer: Self-pay

## 2022-07-16 MED ORDER — GLIMEPIRIDE 1 MG PO TABS
1.0000 mg | ORAL_TABLET | Freq: Every day | ORAL | 3 refills | Status: DC
  Filled 2022-07-16: qty 30, fill #0
  Filled 2022-07-30 – 2022-08-07 (×2): qty 30, 30d supply, fill #0

## 2022-07-16 MED ORDER — CLONAZEPAM 0.5 MG PO TABS
0.5000 mg | ORAL_TABLET | Freq: Every day | ORAL | 2 refills | Status: DC
  Filled 2022-07-16 – 2022-07-25 (×2): qty 30, 30d supply, fill #0
  Filled 2022-08-27: qty 30, 30d supply, fill #1
  Filled 2022-09-11: qty 30, 30d supply, fill #2

## 2022-07-17 ENCOUNTER — Other Ambulatory Visit (HOSPITAL_BASED_OUTPATIENT_CLINIC_OR_DEPARTMENT_OTHER): Payer: Self-pay

## 2022-07-20 ENCOUNTER — Other Ambulatory Visit (HOSPITAL_BASED_OUTPATIENT_CLINIC_OR_DEPARTMENT_OTHER): Payer: Self-pay

## 2022-07-20 DIAGNOSIS — I129 Hypertensive chronic kidney disease with stage 1 through stage 4 chronic kidney disease, or unspecified chronic kidney disease: Secondary | ICD-10-CM

## 2022-07-20 DIAGNOSIS — R7989 Other specified abnormal findings of blood chemistry: Secondary | ICD-10-CM

## 2022-07-20 DIAGNOSIS — E1122 Type 2 diabetes mellitus with diabetic chronic kidney disease: Secondary | ICD-10-CM

## 2022-07-20 DIAGNOSIS — D631 Anemia in chronic kidney disease: Secondary | ICD-10-CM

## 2022-07-20 HISTORY — DX: Other specified abnormal findings of blood chemistry: R79.89

## 2022-07-20 HISTORY — DX: Hypertensive chronic kidney disease with stage 1 through stage 4 chronic kidney disease, or unspecified chronic kidney disease: I12.9

## 2022-07-20 HISTORY — DX: Other disorders of phosphorus metabolism: E83.39

## 2022-07-20 HISTORY — DX: Type 2 diabetes mellitus with diabetic chronic kidney disease: E11.22

## 2022-07-20 HISTORY — DX: Anemia in chronic kidney disease: D63.1

## 2022-07-20 MED ORDER — FUROSEMIDE 40 MG PO TABS
40.0000 mg | ORAL_TABLET | Freq: Every day | ORAL | 3 refills | Status: DC
Start: 2022-07-20 — End: 2022-09-17
  Filled 2022-07-20 – 2022-08-27 (×8): qty 90, 90d supply, fill #0

## 2022-07-22 ENCOUNTER — Other Ambulatory Visit (HOSPITAL_BASED_OUTPATIENT_CLINIC_OR_DEPARTMENT_OTHER): Payer: Self-pay

## 2022-07-25 ENCOUNTER — Other Ambulatory Visit (HOSPITAL_BASED_OUTPATIENT_CLINIC_OR_DEPARTMENT_OTHER): Payer: Self-pay

## 2022-07-27 ENCOUNTER — Other Ambulatory Visit (HOSPITAL_BASED_OUTPATIENT_CLINIC_OR_DEPARTMENT_OTHER): Payer: Self-pay

## 2022-07-27 MED ORDER — ROSUVASTATIN CALCIUM 40 MG PO TABS
40.0000 mg | ORAL_TABLET | Freq: Every day | ORAL | 3 refills | Status: DC
  Filled 2022-07-27: qty 90, fill #0
  Filled 2022-07-30 – 2022-08-10 (×4): qty 90, 90d supply, fill #0
  Filled 2022-09-11 – 2022-11-02 (×2): qty 90, 90d supply, fill #1

## 2022-07-30 ENCOUNTER — Other Ambulatory Visit (HOSPITAL_BASED_OUTPATIENT_CLINIC_OR_DEPARTMENT_OTHER): Payer: Self-pay

## 2022-07-30 MED ORDER — PROMETHAZINE HCL 25 MG PO TABS
ORAL_TABLET | ORAL | 11 refills | Status: DC
  Filled 2022-07-30: qty 60, 15d supply, fill #0
  Filled 2022-08-13: qty 60, 15d supply, fill #1
  Filled 2022-08-27: qty 60, 15d supply, fill #2
  Filled 2022-09-11: qty 60, 15d supply, fill #3
  Filled 2022-09-24: qty 60, 15d supply, fill #4
  Filled 2022-10-09: qty 60, 15d supply, fill #5
  Filled 2022-10-25: qty 60, 15d supply, fill #6
  Filled 2022-11-12: qty 60, 15d supply, fill #7

## 2022-08-07 ENCOUNTER — Other Ambulatory Visit (HOSPITAL_BASED_OUTPATIENT_CLINIC_OR_DEPARTMENT_OTHER): Payer: Self-pay | Admitting: Emergency Medicine

## 2022-08-07 ENCOUNTER — Other Ambulatory Visit (HOSPITAL_BASED_OUTPATIENT_CLINIC_OR_DEPARTMENT_OTHER): Payer: Self-pay

## 2022-08-10 ENCOUNTER — Other Ambulatory Visit (HOSPITAL_BASED_OUTPATIENT_CLINIC_OR_DEPARTMENT_OTHER): Payer: Self-pay

## 2022-08-10 ENCOUNTER — Encounter: Payer: Self-pay | Admitting: Internal Medicine

## 2022-08-10 ENCOUNTER — Ambulatory Visit (INDEPENDENT_AMBULATORY_CARE_PROVIDER_SITE_OTHER): Payer: Commercial Managed Care - HMO | Admitting: Internal Medicine

## 2022-08-10 VITALS — BP 128/78 | HR 90 | Temp 97.3°F | Resp 16 | Ht 63.0 in | Wt 172.0 lb

## 2022-08-10 DIAGNOSIS — E1121 Type 2 diabetes mellitus with diabetic nephropathy: Secondary | ICD-10-CM | POA: Diagnosis not present

## 2022-08-10 DIAGNOSIS — N184 Chronic kidney disease, stage 4 (severe): Secondary | ICD-10-CM | POA: Diagnosis not present

## 2022-08-10 HISTORY — DX: Type 2 diabetes mellitus with diabetic nephropathy: E11.21

## 2022-08-10 NOTE — Progress Notes (Signed)
Office Visit  Subjective   Patient ID: Meghan Terry   DOB: [birth year]   Age: 61 y.o.   MRN: [id number]   Chief Complaint Chief Complaint  Patient presents with   Hypoglycemia     History of Present Illness  The patient is a 61 yo female who returns for a follow-up visit for his T2 diabetes. The patient was diagnosed with T2 diabetes about 10 years ago. She is coming in today with complaints of hypoglycemia that started 2 days ago. She woke up with dizziness and weakness on Saturday morning. She checked her FSBS at that time 44 with one more FSBS in the 60's that evening. The next day she had some hypoglycemia with lowest count down to FSBS 55. She states she ate a peanut butter and jelly sandwich at that time. Since the last visit, there have been no problems. She remains on trulicity 1.5mg  subcut weekly, glimepiride 1mg  daily and farxiga 10mg  daily. He is not walking as much as they would like.Marland Kitchen He specifically denies unexplained abdominal pain, nausea or vomiting. He does not routinely check blood sugars. He came in fasting today in anticipation of lab work. His last HgbA1c was done on 06/05/2022 and was 7.9%.  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  She also has complications of CAD in association with her diabetes.  She denies any diabetic neuopathy or retinopathy but she has not seen an eye doctor in years.  Past Medical History Past Medical History:  Diagnosis Date   Acute diastolic CHF (congestive heart failure) ([place]) 06/08/2021   Acute pulmonary edema ([facility])    Angina pectoris ([place]) 11/17/2019   Anxiety    Atypical chest pain 12/30/2020   CHF (congestive heart failure) ([place]) 06/08/2021   CKD (chronic kidney disease) stage 3b, GFR 30-59 ml/min 12/03/2019   Coronary artery disease    Coronary artery disease involving native coronary artery of native heart with angina pectoris ([place]) 10/03/2019   Depression 04/25/2017   Diabetes  mellitus due to underlying condition with unspecified complications ([place]) 83/11/8248   Essential hypertension 09/05/2019   Ex-smoker 09/05/2019   Family history of coronary artery disease 09/05/2019   History of depression 04/15/2020   Hx of diabetes mellitus 04/15/2020   Hyperlipidemia with target LDL less than 70 11/24/2019   Hypertension    Hypothyroid    Migraine 04/25/2017   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  Eyes:  Negative for blurred vision and double vision.  Respiratory:  Negative for cough, sputum production, shortness of breath and wheezing.   Cardiovascular:  Negative for chest pain, palpitations and leg swelling.  Gastrointestinal:  Positive for nausea. Negative for abdominal pain and vomiting.  Genitourinary:  Negative for dysuria, frequency and hematuria.  Musculoskeletal:  Negative for myalgias.  Skin:  Negative for itching and rash.  Neurological:  Negative for weakness and headaches.       Objective:    Vitals BP 128/78 (BP Location: Right Arm, Patient Position: Sitting, Cuff Size: Normal)   Pulse 90   Temp (!) 97.3 F (36.3 C)   Resp 16   Ht 5\' 3"  (1.6 m)   Wt 172 lb (78 kg)   SpO2 99%   BMI 30.47 kg/m    Physical Examination Physical  Exam Constitutional:      General: She is not in acute distress.    Appearance: Normal appearance. She is not ill-appearing.  Cardiovascular:     Rate and Rhythm: Normal rate and regular rhythm.     Pulses: Normal pulses.     Heart sounds: Normal heart sounds. No murmur heard.    No friction rub. No gallop.  Pulmonary:     Effort: Pulmonary effort is normal. No respiratory distress.     Breath sounds: Normal breath sounds. No wheezing, rhonchi or rales.  Abdominal:     General: Abdomen is flat. Bowel sounds are normal.     Palpations: Abdomen is soft.     Tenderness: There is no abdominal tenderness.  Musculoskeletal:     Right lower leg: No edema.     Left lower leg: No edema.  Skin:    General: Skin is warm and dry.     Findings: No rash.  Neurological:     Mental Status: She is alert.        Assessment & Plan:   Diabetic nephropathy associated with type 2 diabetes mellitus ([place]) She is having hypoglycemia due to her amaryl with her Stage IV CKD.  I want her to continue her trulicity but stop the amaryl and continue on her farxiga.  I see no evidence of infection that could lead to hypoglycemia.  We also discussed eating regularly.  I will see her back in Feb for a diabetic check. We will refer her to optometry.    No follow-ups on file.    Townsend Roger, MD

## 2022-08-10 NOTE — Assessment & Plan Note (Signed)
She is having hypoglycemia due to her amaryl with her Stage IV CKD.  I want her to continue her trulicity but stop the amaryl and continue on her farxiga.  I see no evidence of infection that could lead to hypoglycemia.  We also discussed eating regularly.  I will see her back in Feb for a diabetic check. We will refer her to optometry.

## 2022-08-12 ENCOUNTER — Other Ambulatory Visit (HOSPITAL_BASED_OUTPATIENT_CLINIC_OR_DEPARTMENT_OTHER): Payer: Self-pay

## 2022-08-12 MED ORDER — VITAMIN D3 25 MCG (1000 UNIT) PO TABS
50.0000 ug | ORAL_TABLET | Freq: Every day | ORAL | 3 refills | Status: DC
  Filled 2022-08-12: qty 200, 100d supply, fill #0
  Filled 2022-09-11: qty 200, 100d supply, fill #1
  Filled 2022-10-14: qty 100, 50d supply, fill #2

## 2022-08-13 ENCOUNTER — Other Ambulatory Visit (HOSPITAL_BASED_OUTPATIENT_CLINIC_OR_DEPARTMENT_OTHER): Payer: Self-pay

## 2022-08-13 IMAGING — DX DG CHEST 2V
2 series · 2 of 2 positions shown · non-contrast
Comparison: Radiograph 08/29/2021

CLINICAL DATA: Shortness of breath.  Lower extremity swelling.

EXAM:
CHEST - 2 VIEW

[chest pa]
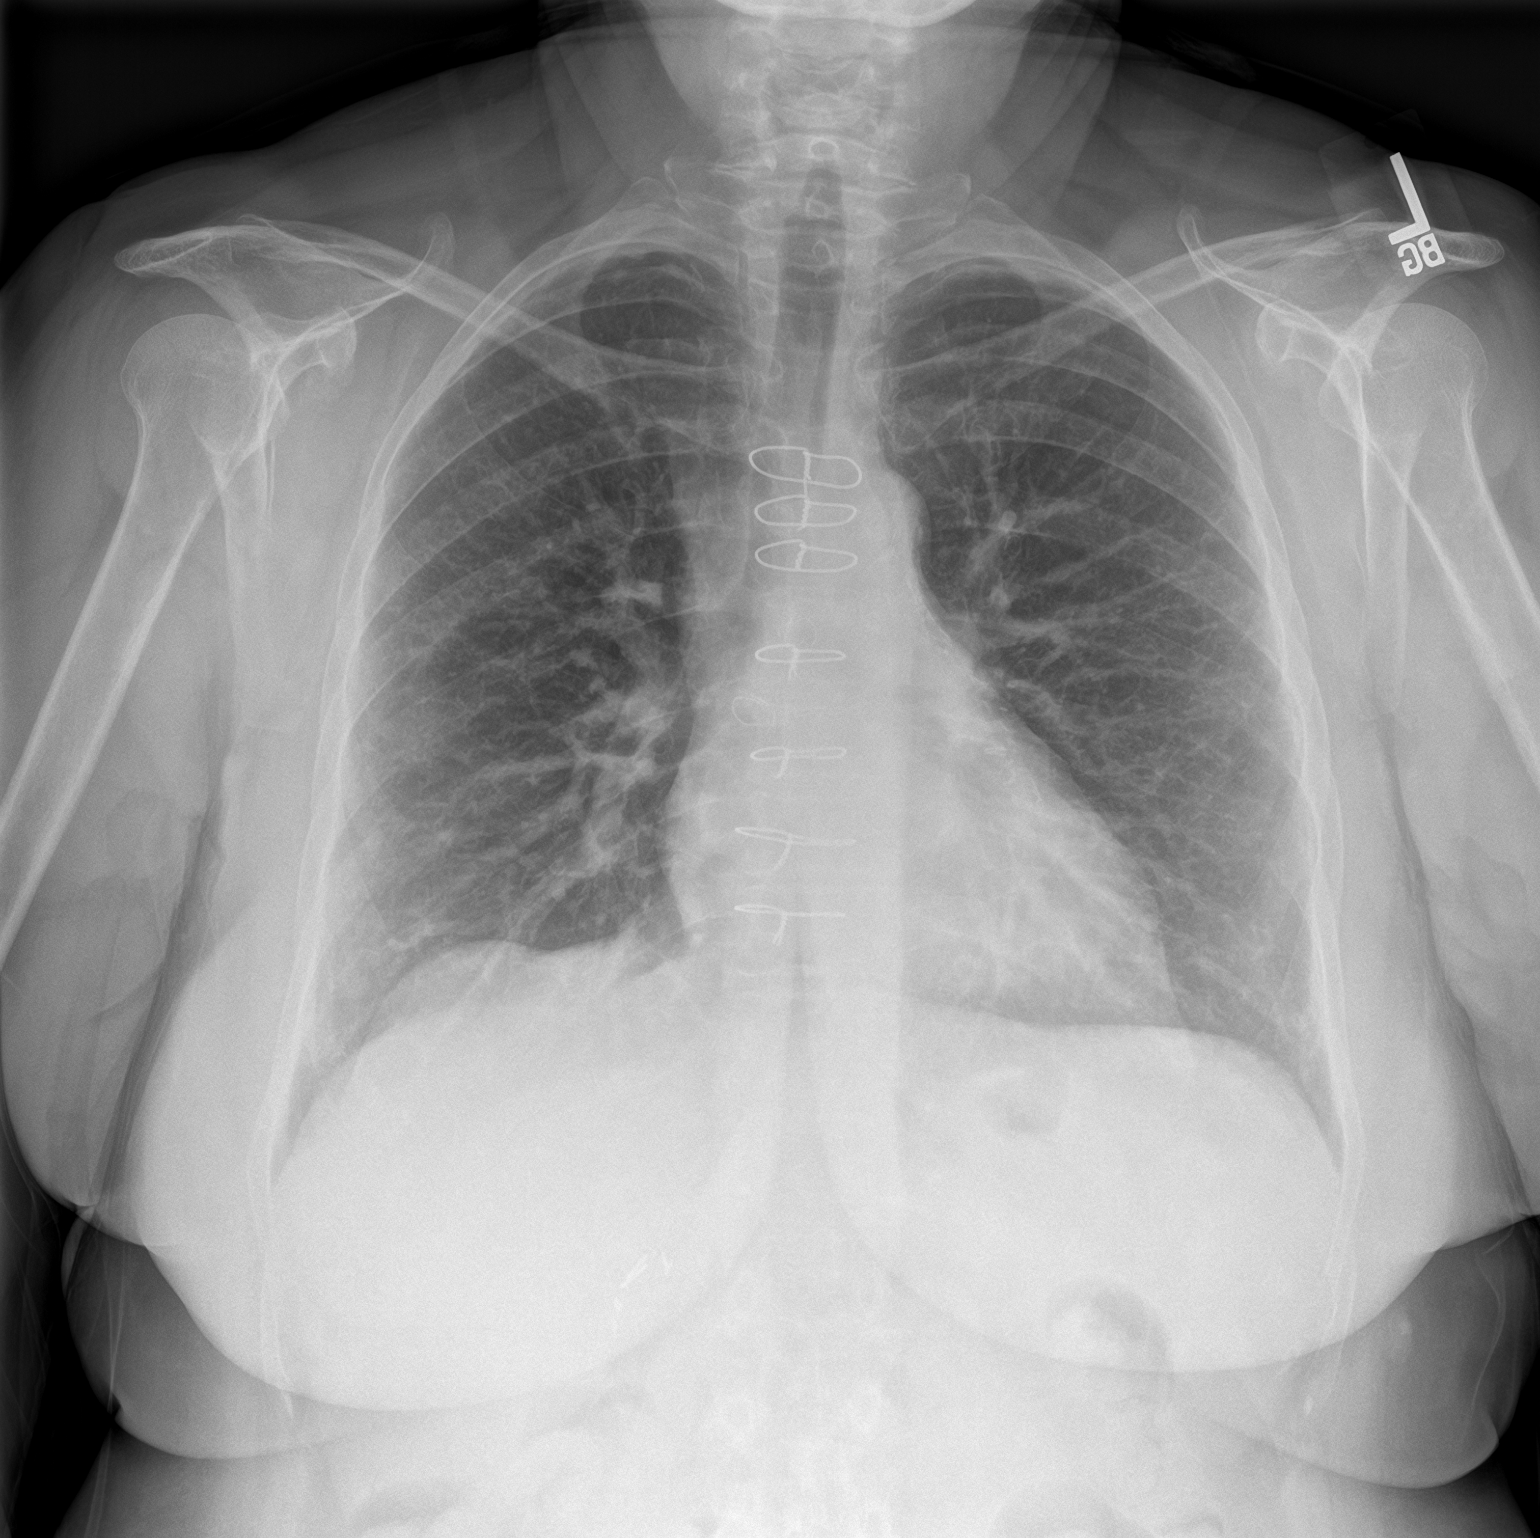

[chest lat]
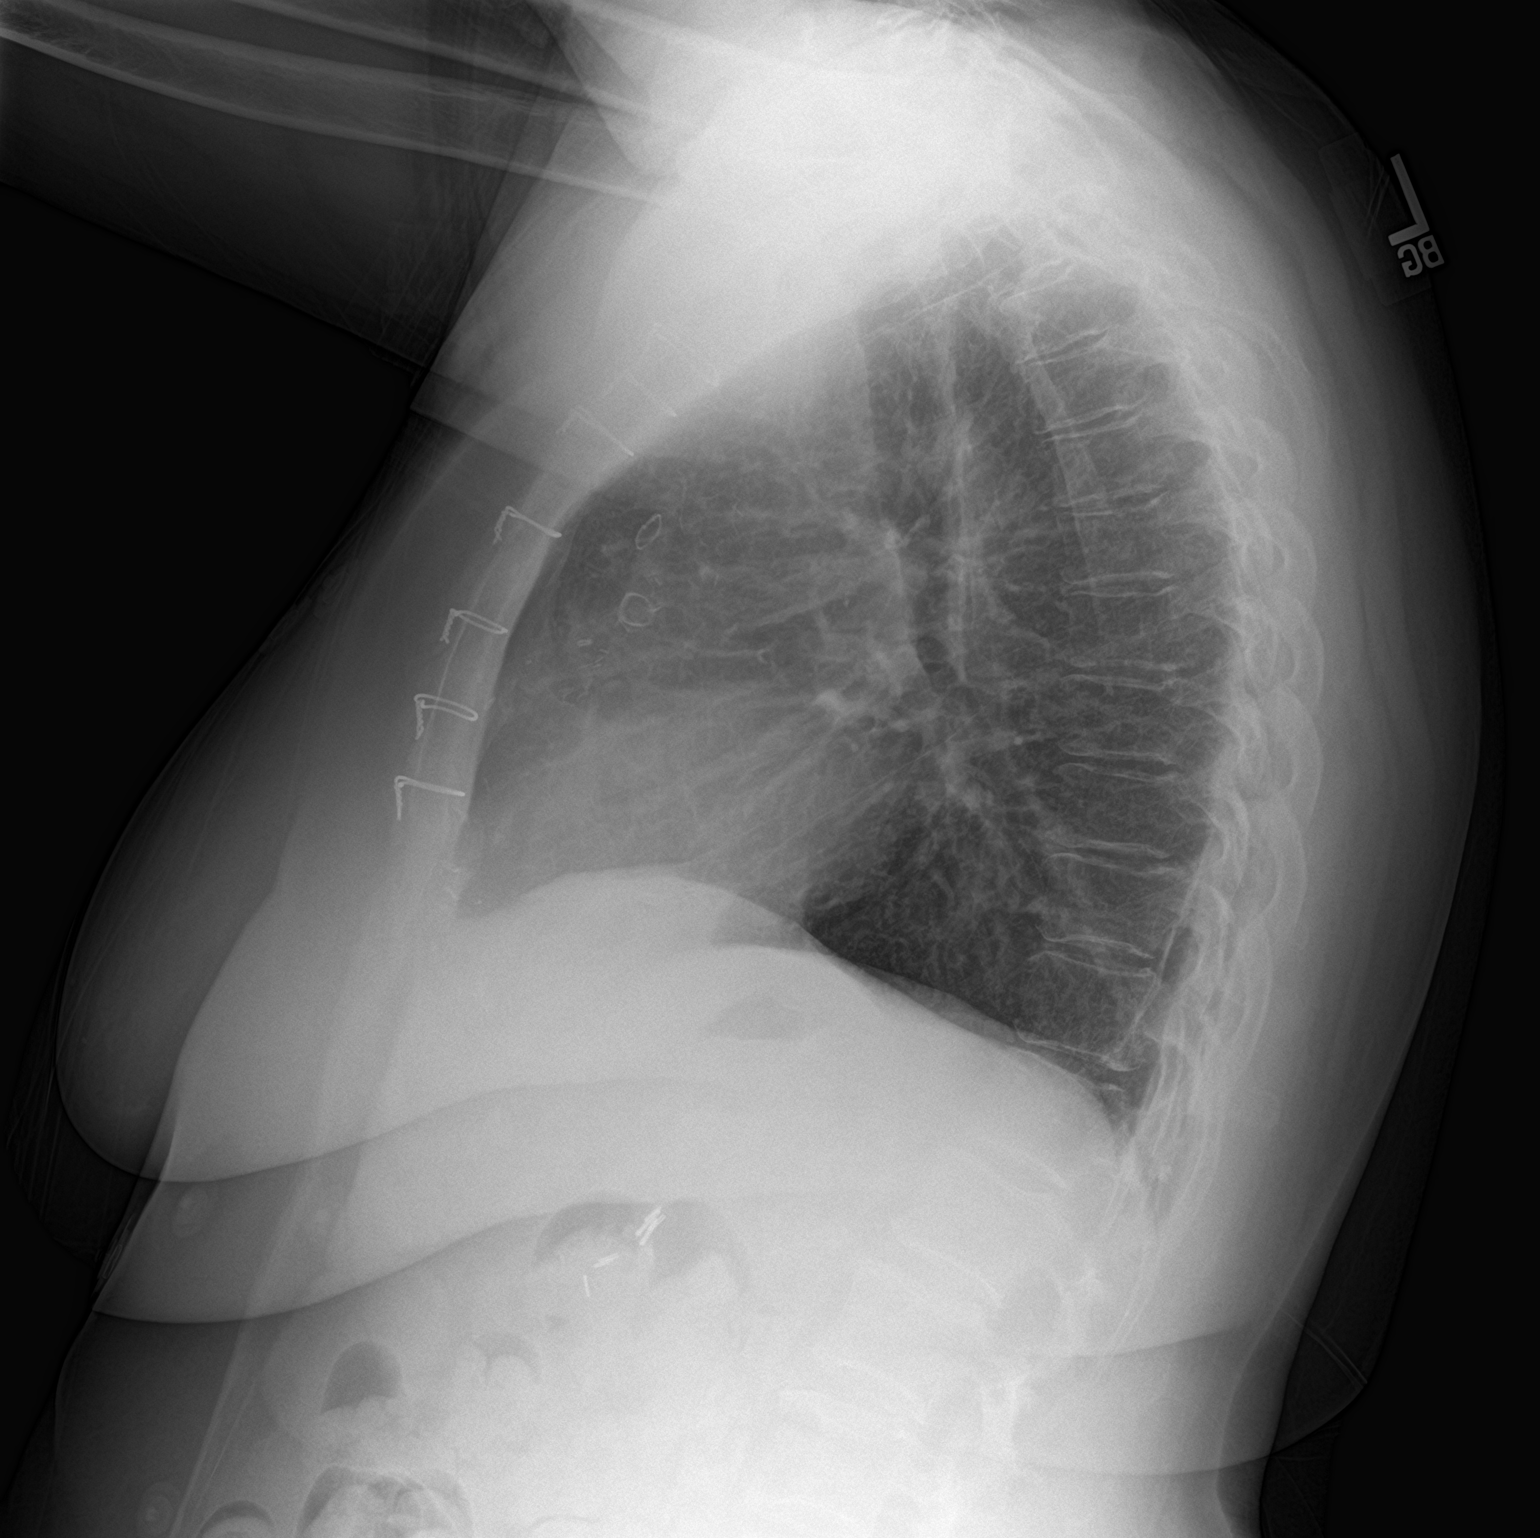

[2 of 2 positions shown; findings below may reference images not displayed]

FINDINGS: Patient is post median sternotomy. The heart is normal in size.
There is mild perivascular haziness. Occasional streaky atelectasis.
No pleural effusion or pneumothorax. Mild thoracic spondylosis.
IMPRESSION: Mild perivascular haziness which may reflect mild pulmonary edema.

## 2022-08-14 ENCOUNTER — Other Ambulatory Visit (HOSPITAL_BASED_OUTPATIENT_CLINIC_OR_DEPARTMENT_OTHER): Payer: Self-pay

## 2022-08-17 ENCOUNTER — Other Ambulatory Visit (HOSPITAL_BASED_OUTPATIENT_CLINIC_OR_DEPARTMENT_OTHER): Payer: Self-pay | Admitting: Emergency Medicine

## 2022-08-17 ENCOUNTER — Other Ambulatory Visit (HOSPITAL_BASED_OUTPATIENT_CLINIC_OR_DEPARTMENT_OTHER): Payer: Self-pay

## 2022-08-18 ENCOUNTER — Other Ambulatory Visit (HOSPITAL_BASED_OUTPATIENT_CLINIC_OR_DEPARTMENT_OTHER): Payer: Self-pay

## 2022-08-21 ENCOUNTER — Other Ambulatory Visit (HOSPITAL_BASED_OUTPATIENT_CLINIC_OR_DEPARTMENT_OTHER): Payer: Self-pay

## 2022-08-27 ENCOUNTER — Other Ambulatory Visit (HOSPITAL_BASED_OUTPATIENT_CLINIC_OR_DEPARTMENT_OTHER): Payer: Self-pay

## 2022-09-04 ENCOUNTER — Other Ambulatory Visit (HOSPITAL_BASED_OUTPATIENT_CLINIC_OR_DEPARTMENT_OTHER): Payer: Self-pay

## 2022-09-06 ENCOUNTER — Other Ambulatory Visit (HOSPITAL_BASED_OUTPATIENT_CLINIC_OR_DEPARTMENT_OTHER): Payer: Self-pay

## 2022-09-06 ENCOUNTER — Other Ambulatory Visit (HOSPITAL_BASED_OUTPATIENT_CLINIC_OR_DEPARTMENT_OTHER): Payer: Self-pay | Admitting: Emergency Medicine

## 2022-09-07 ENCOUNTER — Other Ambulatory Visit: Payer: Self-pay

## 2022-09-07 ENCOUNTER — Other Ambulatory Visit (HOSPITAL_COMMUNITY): Payer: Self-pay

## 2022-09-08 ENCOUNTER — Other Ambulatory Visit (HOSPITAL_COMMUNITY): Payer: Self-pay

## 2022-09-08 ENCOUNTER — Other Ambulatory Visit: Payer: Self-pay

## 2022-09-08 ENCOUNTER — Other Ambulatory Visit (HOSPITAL_BASED_OUTPATIENT_CLINIC_OR_DEPARTMENT_OTHER): Payer: Self-pay

## 2022-09-11 ENCOUNTER — Other Ambulatory Visit: Payer: Self-pay

## 2022-09-11 ENCOUNTER — Other Ambulatory Visit (HOSPITAL_BASED_OUTPATIENT_CLINIC_OR_DEPARTMENT_OTHER): Payer: Self-pay

## 2022-09-17 ENCOUNTER — Ambulatory Visit: Payer: Commercial Managed Care - HMO | Admitting: Cardiology

## 2022-09-17 ENCOUNTER — Ambulatory Visit: Payer: Commercial Managed Care - HMO | Attending: Cardiology | Admitting: Cardiology

## 2022-09-17 ENCOUNTER — Ambulatory Visit: Payer: Commercial Managed Care - HMO | Attending: Cardiology

## 2022-09-17 ENCOUNTER — Other Ambulatory Visit (HOSPITAL_BASED_OUTPATIENT_CLINIC_OR_DEPARTMENT_OTHER): Payer: Self-pay

## 2022-09-17 ENCOUNTER — Encounter: Payer: Self-pay | Admitting: Cardiology

## 2022-09-17 VITALS — BP 118/66 | HR 92 | Ht 63.0 in | Wt 160.0 lb

## 2022-09-17 DIAGNOSIS — I25119 Atherosclerotic heart disease of native coronary artery with unspecified angina pectoris: Secondary | ICD-10-CM | POA: Diagnosis not present

## 2022-09-17 DIAGNOSIS — R002 Palpitations: Secondary | ICD-10-CM

## 2022-09-17 DIAGNOSIS — I129 Hypertensive chronic kidney disease with stage 1 through stage 4 chronic kidney disease, or unspecified chronic kidney disease: Secondary | ICD-10-CM | POA: Diagnosis not present

## 2022-09-17 DIAGNOSIS — E088 Diabetes mellitus due to underlying condition with unspecified complications: Secondary | ICD-10-CM | POA: Diagnosis not present

## 2022-09-17 DIAGNOSIS — Z951 Presence of aortocoronary bypass graft: Secondary | ICD-10-CM

## 2022-09-17 DIAGNOSIS — I10 Essential (primary) hypertension: Secondary | ICD-10-CM

## 2022-09-17 DIAGNOSIS — N184 Chronic kidney disease, stage 4 (severe): Secondary | ICD-10-CM

## 2022-09-17 MED ORDER — METOPROLOL SUCCINATE ER 25 MG PO TB24
25.0000 mg | ORAL_TABLET | Freq: Every day | ORAL | 3 refills | Status: DC
  Filled 2022-09-17: qty 90, 90d supply, fill #0
  Filled 2022-10-09 – 2022-11-02 (×4): qty 90, 90d supply, fill #1

## 2022-09-17 NOTE — Progress Notes (Signed)
Cardiology Office Note:    Date:  09/17/2022   ID:  Meghan Terry, DOB November 12, 1960, MRN [id number]  PCP:  Garwin Brothers, MD  Cardiologist:  Jenean Lindau, MD   Referring MD: Garwin Brothers, MD    ASSESSMENT:    1. Benign hypertension with CKD (chronic kidney disease) stage IV ([place])   2. Coronary artery disease involving native coronary artery of native heart with angina pectoris ([place])   3. Essential hypertension   4. Diabetes mellitus due to underlying condition with unspecified complications ([place])   5. S/P CABG x 3    PLAN:    In order of problems listed above:  Coronary artery disease: Secondary prevention stressed with the patient.  Importance of compliance with diet medication stressed any vocalized understanding.  She was advised to ambulate to the best of her ability. Syncope: I wonder whether she has low blood pressure.  I will cut down her Toprol to half dose.  I will do a monitor to see if there is any issue with the rhythm.  She has palpitations at times. Diabetes mellitus: Managed by primary care.  Diet emphasized. Mixed dyslipidemia" renal insufficiency: On appropriate therapy.  Managed by primary care. In view of syncope she was advised not to drive until cleared by primary care.  Recent echocardiogram was unremarkable.  Also she has good effort tolerance and I cannot see any reason for ischemia evaluation. Patient will be seen in follow-up appointment in 6 months or earlier if the patient has any concerns    Medication Adjustments/Labs and Tests Ordered: Current medicines are reviewed at length with the patient today.  Concerns regarding medicines are outlined above.  No orders of the defined types were placed in this encounter.  No orders of the defined types were placed in this encounter.    No chief complaint on file.    History of Present Illness:    Meghan Terry is a 61 y.o. female.  Patient has past medical history of coronary artery disease, essential  hypertension, diabetes mellitus and advanced renal insufficiency.  She has had CABG surgery in the past.  She tells me that she passed out several months ago.  Subsequently she has had no problems.  No chest pain orthopnea or PND.  At the time of my evaluation, the patient is alert awake oriented and in no distress.  Past Medical History:  Diagnosis Date   Acute diastolic CHF (congestive heart failure) ([place]) 06/08/2021   Acute pulmonary edema ([facility])    Anemia due to stage 4 chronic kidney disease ([place]) 07/20/2022   Angina pectoris ([place]) 11/17/2019   Anxiety    Atypical chest pain 12/30/2020   Benign hypertension with CKD (chronic kidney disease) stage IV ([place]) 07/20/2022   CHF (congestive heart failure) ([place]) 06/08/2021   CKD (chronic kidney disease) stage 3b, GFR 30-59 ml/min 12/03/2019   Coronary artery disease    Coronary artery disease involving native coronary artery of native heart with angina pectoris ([place]) 10/03/2019   Depression 04/25/2017   Diabetes mellitus due to underlying condition with unspecified complications ([place]) 29/92/4268   Diabetes mellitus with stage 4 chronic kidney disease GFR 15-29 ([place]) 07/20/2022   Essential hypertension 09/05/2019   Ex-smoker 09/05/2019   Family history of coronary artery disease 09/05/2019   History of depression 04/15/2020   Hx of diabetes mellitus 04/15/2020   Hyperlipidemia with target LDL less than 70 11/24/2019   Hyperphosphatemia 07/20/2022   Hypertension    Hypokalemia 04/30/2022  Hypothyroid    Migraine 04/25/2017   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   Syncope and collapse 04/29/2022   Thyroid disease    UTI (urinary  tract infection) 04/30/2022    Past Surgical History:  Procedure Laterality Date   ABDOMINAL HYSTERECTOMY     APPENDECTOMY     CARDIAC CATHETERIZATION  11/22/2019   CHOLECYSTECTOMY     COLONOSCOPY     CORONARY ARTERY BYPASS GRAFT N/A 09/15/2019   Procedure: CORONARY ARTERY BYPASS GRAFTING (CABG) times three on pump using left internal mammary artery and right and left greater saphenous veins harvested endoscopically;  Surgeon: Gaye Pollack, MD;  Location: [facility];  Service: Open Heart Surgery;  Laterality: N/A;   CORONARY STENT INTERVENTION N/A 11/23/2019   Procedure: CORONARY STENT INTERVENTION;  Surgeon: [name], MD;  Location: [facility];  Service: Cardiovascular;  Laterality: N/A;   KNEE ARTHROSCOPY     LEFT HEART CATH AND CORONARY ANGIOGRAPHY N/A 09/14/2019   Procedure: LEFT HEART CATH AND CORONARY ANGIOGRAPHY;  Surgeon: [name], MD;  Location: [facility];  Service: Cardiovascular;  Laterality: N/A;   LEFT HEART CATH AND CORS/GRAFTS ANGIOGRAPHY N/A 11/22/2019   Procedure: LEFT HEART CATH AND CORS/GRAFTS ANGIOGRAPHY;  Surgeon: [name], MD;  Location: [facility];  Service: Cardiovascular;  Laterality: N/A;   LEFT HEART CATH AND CORS/GRAFTS ANGIOGRAPHY N/A 03/13/2020   Procedure: LEFT HEART CATH AND CORS/GRAFTS ANGIOGRAPHY;  Surgeon: Lorretta Harp, MD;  Location: [facility];  Service: Cardiovascular;  Laterality: N/A;   LEFT HEART CATH AND CORS/GRAFTS ANGIOGRAPHY N/A 12/31/2020   Procedure: LEFT HEART CATH AND CORS/GRAFTS ANGIOGRAPHY;  Surgeon: Belva Crome, MD;  Location: [facility];  Service: Cardiovascular;  Laterality: N/A;   OSTEOCHONDRAL DEFECT REPAIR/RECONSTRUCTION Left 11/29/2014   Procedure: LEFT ANKLE MEDIAL MALLEOLUS OSTEOTOMY,AUTO GRAFT FROM CALCANEOUS;  OS TIBIA DENORO GRAFTING TALUS;  Surgeon: Wylene Simmer, MD;  Location: [facility];  Service: Orthopedics;  Laterality: Left;   TEE WITHOUT  CARDIOVERSION N/A 09/15/2019   Procedure: TRANSESOPHAGEAL ECHOCARDIOGRAM (TEE);  Surgeon: Gaye Pollack, MD;  Location: [facility];  Service: Open Heart Surgery;  Laterality: N/A;   TUBAL LIGATION      Current Medications: Current Meds  Medication Sig   acetaminophen (TYLENOL) 500 MG tablet Take 2 tablets by mouth 3 times daily as needed for pain   aspirin EC 81 MG tablet Take 1 tablet (81 mg total) by mouth daily.   buPROPion (WELLBUTRIN XL) 300 MG 24 hr tablet Take 1 tablet (300 mg total) by mouth daily.   cholecalciferol (VITAMIN D3) 25 MCG (1000 UNIT) tablet Take 2 tablets (50 mcg total) by mouth daily.   clonazePAM (KLONOPIN) 0.5 MG tablet Take 1 tablet (0.5 mg total) by mouth at bedtime.   clopidogrel (PLAVIX) 75 MG tablet TAKE 1 TABLET BY MOUTH DAILY   dapagliflozin propanediol (FARXIGA) 10 MG TABS tablet Take 1 tablet (10 mg total) by mouth daily before breakfast.   Dulaglutide (TRULICITY) 1.5 QV/9.5GL SOPN Inject 1.5 mg into the skin every 7 (seven) days.   furosemide (LASIX) 40 MG tablet Take  1.5 tablets (60 mg total) by mouth 2 (two) times daily.   levothyroxine (SYNTHROID) 50 MCG tablet TAKE 1 TABLET BY MOUTH ONCE DAILY ON AN EMPTY STOMACH 30 MINUTES BEFORE BREAKFAST ON SAT AND SUNDAY   levothyroxine (SYNTHROID) 75 MCG tablet Take 1 tablet (75 mcg total) by mouth daily Monday thru Friday.   metoprolol succinate (TOPROL-XL) 50 MG 24 hr tablet Take 1 tablet (50 mg total) by mouth at bedtime. Take with or immediately following a meal.   nitroGLYCERIN (NITROSTAT) 0.4 MG SL tablet Place 1 tablet (0.4 mg total) under the tongue every 5 (five) minutes as needed for chest pain.   ondansetron (ZOFRAN) 4 MG tablet take 1 tablet (4 mg) by mouth 2 times per day as needed for nausea   promethazine (PHENERGAN) 25 MG tablet take 1 tablet (25 mg) by mouth every 6 hours as needed for 30 days   ranolazine (RANEXA) 500 MG 12 hr tablet Take 1 tablet by mouth 2 times daily   rOPINIRole (REQUIP) 1 MG  tablet Take 1 tablet (1 mg total) by mouth 3 (three) times daily.   rOPINIRole (REQUIP) 4 MG tablet Take 1 tablet (4 mg total) by mouth 1 to 3 hours before bedtime.   rosuvastatin (CRESTOR) 40 MG tablet Take 1 tablet (40 mg total) by mouth daily.   topiramate (TOPAMAX) 100 MG tablet TAKE 1 TABLET BY MOUTH TWICE DAILY.   traMADol (ULTRAM) 50 MG tablet Take 1 tablet (50 mg total) by mouth every 12 (twelve) hours as needed for pain.   traZODone (DESYREL) 150 MG tablet Take 1 tablet (150 mg total) by mouth daily.   traZODone (DESYREL) 50 MG tablet Take 1 tablet (50 mg total) by mouth at bedtime. Take with 150 mg tablet (200 mg total).     Allergies:   Abilify [aripiprazole], Ciprofloxacin, Carbidopa-levodopa, Prednisone, and Venlafaxine   Social History   Socioeconomic History   Marital status: Married    Spouse name: Yazmyne Sara   Number of children: 2   Years of education: Not on file   Highest education level: Associate degree: occupational, Hotel manager, or vocational program  Occupational History   Occupation: disabilty    Comment: EMT/CNA @ [employer]  Tobacco Use   Smoking status: Former    Packs/day: 1.00    Years: 47.00    Total pack years: 47.00    Types: Cigarettes    Quit date: 09/15/2019    Years since quitting: 3.0    Passive exposure: Past   Smokeless tobacco: Never  Vaping Use   Vaping Use: Every day   Start date: 09/15/2019   Substances: Nicotine  Substance and Sexual Activity   Alcohol use: No   Drug use: No   Sexual activity: Not on file  Other Topics Concern   Not on file  Social History Narrative   Not on file   Social Determinants of Health   Financial Resource Strain: High Risk (06/19/2021)   Overall Financial Resource Strain (CARDIA)    Difficulty of Paying Living Expenses: Very hard  Food Insecurity: No Food Insecurity (06/19/2021)   Hunger Vital Sign    Worried About Running Out of Food in the Last Year: Never true    Ran Out of Food in the  Last Year: Never true  Transportation Needs: No Transportation Needs (06/10/2021)   PRAPARE - Hydrologist (Medical): No    Lack of Transportation (Non-Medical): No  Physical Activity: Not on file  Stress: Not on file  Social Connections: Not on file     Family History: The patient's family history includes Asthma in her mother; COPD in her mother; Congestive Heart Failure in her father; Diabetes in her mother; Macular degeneration in her mother.  ROS:   Please see the history of present illness.    All other systems reviewed and are negative.  EKGs/Labs/Other Studies Reviewed:    The following studies were reviewed today: EKG reveals sinus rhythm and nonspecific ST-T changes   Recent Labs: 04/09/2022: B Natriuretic Peptide 156.4 04/30/2022: ALT 11; Hemoglobin 9.7; Platelets 280; TSH 1.701 05/01/2022: BUN 39; Creatinine, Ser 3.13; Magnesium 2.5; Potassium 3.9; Sodium 139  Recent Lipid Panel    Component Value Date/Time   CHOL 147 03/28/2021 0831   TRIG 147 03/28/2021 0831   HDL 71 03/28/2021 0831   CHOLHDL 2.1 03/28/2021 0831   CHOLHDL 3.3 09/18/2019 0355   VLDL 24 09/18/2019 0355   LDLCALC 51 03/28/2021 0831    Physical Exam:    VS:  BP 118/66   Pulse 92   Ht 5\' 3"  (1.6 m)   Wt 160 lb (72.6 kg)   SpO2 96%   BMI 28.34 kg/m     Wt Readings from Last 3 Encounters:  09/17/22 160 lb (72.6 kg)  08/10/22 172 lb (78 kg)  04/30/22 184 lb 15.5 oz (83.9 kg)     GEN: Patient is in no acute distress HEENT: Normal NECK: No JVD; No carotid bruits LYMPHATICS: No lymphadenopathy CARDIAC: Hear sounds regular, 2/6 systolic murmur at the apex. RESPIRATORY:  Clear to auscultation without rales, wheezing or rhonchi  ABDOMEN: Soft, non-tender, non-distended MUSCULOSKELETAL:  No edema; No deformity  SKIN: Warm and dry NEUROLOGIC:  Alert and oriented x 3 PSYCHIATRIC:  Normal affect   Signed, Jenean Lindau, MD  09/17/2022 4:05 PM    [facility]

## 2022-09-17 NOTE — Patient Instructions (Signed)
Medication Instructions:  Your physician has recommended you make the following change in your medication:   Decrease your Toprol XL to 25 mg daily.  *If you need a refill on your cardiac medications before your next appointment, please call your pharmacy*   Lab Work: None ordered If you have labs (blood work) drawn today and your tests are completely normal, you will receive your results only by: [place] (if you have MyChart) OR A paper copy in the mail If you have any lab test that is abnormal or we need to change your treatment, we will call you to review the results.   Testing/Procedures:  WHY IS MY DOCTOR PRESCRIBING ZIO? The Zio system is proven and trusted by physicians to detect and diagnose irregular heart rhythms -- and has been prescribed to hundreds of thousands of patients.  The FDA has cleared the Zio system to monitor for many different kinds of irregular heart rhythms. In a study, physicians were able to reach a diagnosis 90% of the time with the Zio system1.  You can wear the Zio monitor -- a small, discreet, comfortable patch -- during your normal day-to-day activity, including while you sleep, shower, and exercise, while it records every single heartbeat for analysis.  1Barrett, P., et al. Comparison of 24 Hour Holter Monitoring Versus 14 Day Novel Adhesive Patch Electrocardiographic Monitoring. [place], 2014.  ZIO VS. HOLTER MONITORING The Zio monitor can be comfortably worn for up to 14 days. Holter monitors can be worn for 24 to 48 hours, limiting the time to record any irregular heart rhythms you may have. Zio is able to capture data for the 51% of patients who have their first symptom-triggered arrhythmia after 48 hours.1  LIVE WITHOUT RESTRICTIONS The Zio ambulatory cardiac monitor is a small, unobtrusive, and water-resistant patch--you might even forget you're wearing it. The Zio monitor records and stores every beat of your heart,  whether you're sleeping, working out, or showering. Wear the monitor for 14 days, remove 10/01/22.   Follow-Up: At [facility], you and your health needs are our priority.  As part of our continuing mission to provide you with exceptional heart care, we have created designated Provider Care Teams.  These Care Teams include your primary Cardiologist (physician) and Advanced Practice Providers (APPs -  Physician Assistants and Nurse Practitioners) who all work together to provide you with the care you need, when you need it.  We recommend signing up for the patient portal called "MyChart".  Sign up information is provided on this After Visit Summary.  MyChart is used to connect with patients for Virtual Visits (Telemedicine).  Patients are able to view lab/test results, encounter notes, upcoming appointments, etc.  Non-urgent messages can be sent to your provider as well.   To learn more about what you can do with MyChart, go to [email or web address].    Your next appointment:   6 month(s)  The format for your next appointment:   In Person  Provider:   Jyl Heinz, MD   Other Instructions NA

## 2022-09-30 ENCOUNTER — Other Ambulatory Visit: Payer: Self-pay | Admitting: Internal Medicine

## 2022-09-30 ENCOUNTER — Other Ambulatory Visit (HOSPITAL_BASED_OUTPATIENT_CLINIC_OR_DEPARTMENT_OTHER): Payer: Self-pay

## 2022-09-30 ENCOUNTER — Encounter (HOSPITAL_BASED_OUTPATIENT_CLINIC_OR_DEPARTMENT_OTHER): Payer: Self-pay

## 2022-09-30 ENCOUNTER — Other Ambulatory Visit (HOSPITAL_BASED_OUTPATIENT_CLINIC_OR_DEPARTMENT_OTHER): Payer: Self-pay | Admitting: Emergency Medicine

## 2022-10-07 ENCOUNTER — Encounter: Payer: Self-pay | Admitting: Cardiology

## 2022-10-08 DIAGNOSIS — R002 Palpitations: Secondary | ICD-10-CM | POA: Diagnosis not present

## 2022-10-09 ENCOUNTER — Other Ambulatory Visit (HOSPITAL_BASED_OUTPATIENT_CLINIC_OR_DEPARTMENT_OTHER): Payer: Self-pay

## 2022-10-09 ENCOUNTER — Other Ambulatory Visit: Payer: Self-pay

## 2022-10-14 ENCOUNTER — Other Ambulatory Visit (HOSPITAL_BASED_OUTPATIENT_CLINIC_OR_DEPARTMENT_OTHER): Payer: Self-pay

## 2022-10-14 ENCOUNTER — Other Ambulatory Visit: Payer: Self-pay

## 2022-10-14 ENCOUNTER — Encounter: Payer: Self-pay | Admitting: Cardiology

## 2022-10-16 ENCOUNTER — Other Ambulatory Visit (HOSPITAL_BASED_OUTPATIENT_CLINIC_OR_DEPARTMENT_OTHER): Payer: Self-pay | Admitting: Emergency Medicine

## 2022-10-16 ENCOUNTER — Other Ambulatory Visit (HOSPITAL_BASED_OUTPATIENT_CLINIC_OR_DEPARTMENT_OTHER): Payer: Self-pay

## 2022-10-22 DIAGNOSIS — F418 Other specified anxiety disorders: Secondary | ICD-10-CM | POA: Diagnosis not present

## 2022-10-22 DIAGNOSIS — G2581 Restless legs syndrome: Secondary | ICD-10-CM | POA: Diagnosis not present

## 2022-10-22 DIAGNOSIS — E1110 Type 2 diabetes mellitus with ketoacidosis without coma: Secondary | ICD-10-CM | POA: Diagnosis not present

## 2022-10-22 DIAGNOSIS — I129 Hypertensive chronic kidney disease with stage 1 through stage 4 chronic kidney disease, or unspecified chronic kidney disease: Secondary | ICD-10-CM | POA: Diagnosis not present

## 2022-10-22 DIAGNOSIS — G43009 Migraine without aura, not intractable, without status migrainosus: Secondary | ICD-10-CM | POA: Diagnosis not present

## 2022-10-22 DIAGNOSIS — E782 Mixed hyperlipidemia: Secondary | ICD-10-CM | POA: Diagnosis not present

## 2022-10-22 DIAGNOSIS — G4700 Insomnia, unspecified: Secondary | ICD-10-CM | POA: Diagnosis not present

## 2022-10-22 DIAGNOSIS — E559 Vitamin D deficiency, unspecified: Secondary | ICD-10-CM | POA: Insufficient documentation

## 2022-10-22 DIAGNOSIS — R110 Nausea: Secondary | ICD-10-CM | POA: Insufficient documentation

## 2022-10-22 DIAGNOSIS — M545 Low back pain, unspecified: Secondary | ICD-10-CM | POA: Insufficient documentation

## 2022-10-22 DIAGNOSIS — I700 Atherosclerosis of aorta: Secondary | ICD-10-CM | POA: Diagnosis not present

## 2022-10-22 DIAGNOSIS — R600 Localized edema: Secondary | ICD-10-CM | POA: Insufficient documentation

## 2022-10-22 DIAGNOSIS — Z0189 Encounter for other specified special examinations: Secondary | ICD-10-CM | POA: Diagnosis not present

## 2022-10-22 DIAGNOSIS — E1122 Type 2 diabetes mellitus with diabetic chronic kidney disease: Secondary | ICD-10-CM | POA: Diagnosis not present

## 2022-10-22 DIAGNOSIS — I25810 Atherosclerosis of coronary artery bypass graft(s) without angina pectoris: Secondary | ICD-10-CM | POA: Diagnosis not present

## 2022-10-22 DIAGNOSIS — N184 Chronic kidney disease, stage 4 (severe): Secondary | ICD-10-CM | POA: Diagnosis not present

## 2022-10-25 ENCOUNTER — Other Ambulatory Visit (HOSPITAL_BASED_OUTPATIENT_CLINIC_OR_DEPARTMENT_OTHER): Payer: Self-pay

## 2022-10-26 ENCOUNTER — Other Ambulatory Visit (HOSPITAL_BASED_OUTPATIENT_CLINIC_OR_DEPARTMENT_OTHER): Payer: Self-pay

## 2022-10-26 ENCOUNTER — Other Ambulatory Visit: Payer: Self-pay

## 2022-10-27 ENCOUNTER — Other Ambulatory Visit (HOSPITAL_BASED_OUTPATIENT_CLINIC_OR_DEPARTMENT_OTHER): Payer: Self-pay

## 2022-10-30 ENCOUNTER — Ambulatory Visit: Payer: Commercial Managed Care - HMO | Admitting: Internal Medicine

## 2022-10-30 ENCOUNTER — Other Ambulatory Visit (HOSPITAL_BASED_OUTPATIENT_CLINIC_OR_DEPARTMENT_OTHER): Payer: Self-pay

## 2022-10-30 ENCOUNTER — Other Ambulatory Visit (HOSPITAL_BASED_OUTPATIENT_CLINIC_OR_DEPARTMENT_OTHER): Payer: Self-pay | Admitting: Emergency Medicine

## 2022-11-02 ENCOUNTER — Other Ambulatory Visit (HOSPITAL_BASED_OUTPATIENT_CLINIC_OR_DEPARTMENT_OTHER): Payer: Self-pay

## 2022-11-02 ENCOUNTER — Other Ambulatory Visit: Payer: Self-pay

## 2022-11-03 ENCOUNTER — Other Ambulatory Visit (HOSPITAL_BASED_OUTPATIENT_CLINIC_OR_DEPARTMENT_OTHER): Payer: Self-pay

## 2022-11-03 DIAGNOSIS — H43813 Vitreous degeneration, bilateral: Secondary | ICD-10-CM | POA: Diagnosis not present

## 2022-11-03 DIAGNOSIS — H43823 Vitreomacular adhesion, bilateral: Secondary | ICD-10-CM | POA: Diagnosis not present

## 2022-11-03 DIAGNOSIS — H2513 Age-related nuclear cataract, bilateral: Secondary | ICD-10-CM | POA: Diagnosis not present

## 2022-11-03 DIAGNOSIS — H35363 Drusen (degenerative) of macula, bilateral: Secondary | ICD-10-CM | POA: Diagnosis not present

## 2022-11-03 DIAGNOSIS — H35033 Hypertensive retinopathy, bilateral: Secondary | ICD-10-CM | POA: Diagnosis not present

## 2022-11-03 MED ORDER — CLONAZEPAM 0.5 MG PO TABS
0.5000 mg | ORAL_TABLET | Freq: Three times a day (TID) | ORAL | 0 refills | Status: DC
  Filled 2022-11-03: qty 30, 10d supply, fill #0

## 2022-11-03 MED ORDER — TRAMADOL HCL 50 MG PO TABS
50.0000 mg | ORAL_TABLET | Freq: Four times a day (QID) | ORAL | 2 refills | Status: DC
  Filled 2022-11-03: qty 30, 8d supply, fill #0
  Filled 2022-11-12: qty 30, 8d supply, fill #1

## 2022-11-03 MED ORDER — TRULICITY 1.5 MG/0.5ML ~~LOC~~ SOAJ
1.5000 mg | SUBCUTANEOUS | 0 refills | Status: DC
  Filled 2022-11-03: qty 2, 28d supply, fill #0

## 2022-11-03 MED ORDER — ROPINIROLE HCL 1 MG PO TABS
1.0000 mg | ORAL_TABLET | Freq: Three times a day (TID) | ORAL | 0 refills | Status: DC
  Filled 2022-11-03 – 2022-11-12 (×5): qty 90, 30d supply, fill #0

## 2022-11-03 MED ORDER — TRAZODONE HCL 150 MG PO TABS
150.0000 mg | ORAL_TABLET | Freq: Two times a day (BID) | ORAL | 0 refills | Status: DC
  Filled 2022-11-03: qty 30, 15d supply, fill #0

## 2022-11-03 MED ORDER — TRAZODONE HCL 50 MG PO TABS
50.0000 mg | ORAL_TABLET | Freq: Every day | ORAL | 0 refills | Status: DC
  Filled 2022-11-03 – 2022-11-12 (×2): qty 30, 30d supply, fill #0
  Filled ????-??-??: fill #0

## 2022-11-05 ENCOUNTER — Other Ambulatory Visit (HOSPITAL_BASED_OUTPATIENT_CLINIC_OR_DEPARTMENT_OTHER): Payer: Self-pay

## 2022-11-11 ENCOUNTER — Other Ambulatory Visit (HOSPITAL_BASED_OUTPATIENT_CLINIC_OR_DEPARTMENT_OTHER): Payer: Self-pay

## 2022-11-12 ENCOUNTER — Other Ambulatory Visit: Payer: Self-pay

## 2022-11-12 ENCOUNTER — Other Ambulatory Visit (HOSPITAL_BASED_OUTPATIENT_CLINIC_OR_DEPARTMENT_OTHER): Payer: Self-pay

## 2022-11-12 DIAGNOSIS — E1122 Type 2 diabetes mellitus with diabetic chronic kidney disease: Secondary | ICD-10-CM | POA: Diagnosis not present

## 2022-11-12 DIAGNOSIS — D509 Iron deficiency anemia, unspecified: Secondary | ICD-10-CM | POA: Diagnosis not present

## 2022-11-12 DIAGNOSIS — M545 Low back pain, unspecified: Secondary | ICD-10-CM | POA: Diagnosis not present

## 2022-11-12 DIAGNOSIS — K5909 Other constipation: Secondary | ICD-10-CM

## 2022-11-12 DIAGNOSIS — I25810 Atherosclerosis of coronary artery bypass graft(s) without angina pectoris: Secondary | ICD-10-CM | POA: Diagnosis not present

## 2022-11-12 DIAGNOSIS — I700 Atherosclerosis of aorta: Secondary | ICD-10-CM | POA: Diagnosis not present

## 2022-11-12 DIAGNOSIS — Z0001 Encounter for general adult medical examination with abnormal findings: Secondary | ICD-10-CM | POA: Diagnosis not present

## 2022-11-12 DIAGNOSIS — E876 Hypokalemia: Secondary | ICD-10-CM | POA: Diagnosis not present

## 2022-11-12 DIAGNOSIS — F331 Major depressive disorder, recurrent, moderate: Secondary | ICD-10-CM

## 2022-11-12 DIAGNOSIS — L853 Xerosis cutis: Secondary | ICD-10-CM | POA: Insufficient documentation

## 2022-11-12 DIAGNOSIS — I5033 Acute on chronic diastolic (congestive) heart failure: Secondary | ICD-10-CM | POA: Diagnosis not present

## 2022-11-12 DIAGNOSIS — N184 Chronic kidney disease, stage 4 (severe): Secondary | ICD-10-CM | POA: Diagnosis not present

## 2022-11-12 DIAGNOSIS — I130 Hypertensive heart and chronic kidney disease with heart failure and stage 1 through stage 4 chronic kidney disease, or unspecified chronic kidney disease: Secondary | ICD-10-CM | POA: Diagnosis not present

## 2022-11-12 HISTORY — DX: Major depressive disorder, recurrent, moderate: F33.1

## 2022-11-12 HISTORY — DX: Other constipation: K59.09

## 2022-11-12 MED ORDER — ROPINIROLE HCL 1 MG PO TABS
1.0000 mg | ORAL_TABLET | Freq: Three times a day (TID) | ORAL | 0 refills | Status: DC
  Filled 2022-11-12: qty 90, 30d supply, fill #0
  Filled ????-??-??: fill #0

## 2022-11-12 MED ORDER — POTASSIUM CHLORIDE ER 20 MEQ PO TBCR
20.0000 meq | EXTENDED_RELEASE_TABLET | Freq: Every day | ORAL | 0 refills | Status: DC
  Filled 2022-11-12: qty 30, 30d supply, fill #0

## 2022-11-13 ENCOUNTER — Other Ambulatory Visit (HOSPITAL_BASED_OUTPATIENT_CLINIC_OR_DEPARTMENT_OTHER): Payer: Self-pay

## 2022-11-17 ENCOUNTER — Other Ambulatory Visit (HOSPITAL_BASED_OUTPATIENT_CLINIC_OR_DEPARTMENT_OTHER): Payer: Self-pay

## 2022-11-17 MED ORDER — TRAZODONE HCL 150 MG PO TABS: 150.0000 mg | ORAL_TABLET | Freq: Two times a day (BID) | ORAL | 0 refills | Status: DC

## 2022-12-21 ENCOUNTER — Other Ambulatory Visit: Payer: Self-pay

## 2022-12-21 ENCOUNTER — Encounter (HOSPITAL_COMMUNITY): Payer: Self-pay

## 2022-12-21 ENCOUNTER — Emergency Department (HOSPITAL_COMMUNITY): Payer: Medicare Other

## 2022-12-21 ENCOUNTER — Inpatient Hospital Stay (HOSPITAL_COMMUNITY)
Admission: EM | Admit: 2022-12-21 | Discharge: 2022-12-25 | DRG: 812 | Disposition: A | Discharge status: Home / Self Care | Payer: Medicare Other | Attending: Family Medicine | Admitting: Family Medicine

## 2022-12-21 DIAGNOSIS — Z87440 Personal history of urinary (tract) infections: Secondary | ICD-10-CM

## 2022-12-21 DIAGNOSIS — Z79890 Hormone replacement therapy (postmenopausal): Secondary | ICD-10-CM

## 2022-12-21 DIAGNOSIS — Z8249 Family history of ischemic heart disease and other diseases of the circulatory system: Secondary | ICD-10-CM

## 2022-12-21 DIAGNOSIS — Z90710 Acquired absence of both cervix and uterus: Secondary | ICD-10-CM

## 2022-12-21 DIAGNOSIS — Z7982 Long term (current) use of aspirin: Secondary | ICD-10-CM

## 2022-12-21 DIAGNOSIS — E538 Deficiency of other specified B group vitamins: Secondary | ICD-10-CM | POA: Diagnosis present

## 2022-12-21 DIAGNOSIS — K5900 Constipation, unspecified: Secondary | ICD-10-CM | POA: Diagnosis present

## 2022-12-21 DIAGNOSIS — F32A Depression, unspecified: Secondary | ICD-10-CM | POA: Diagnosis present

## 2022-12-21 DIAGNOSIS — D649 Anemia, unspecified: Secondary | ICD-10-CM | POA: Diagnosis not present

## 2022-12-21 DIAGNOSIS — Z79899 Other long term (current) drug therapy: Secondary | ICD-10-CM

## 2022-12-21 DIAGNOSIS — R8281 Pyuria: Secondary | ICD-10-CM | POA: Diagnosis present

## 2022-12-21 DIAGNOSIS — Z881 Allergy status to other antibiotic agents status: Secondary | ICD-10-CM

## 2022-12-21 DIAGNOSIS — K9162 Intraoperative hemorrhage and hematoma of a digestive system organ or structure complicating other procedure: Secondary | ICD-10-CM | POA: Diagnosis not present

## 2022-12-21 DIAGNOSIS — K259 Gastric ulcer, unspecified as acute or chronic, without hemorrhage or perforation: Secondary | ICD-10-CM | POA: Diagnosis present

## 2022-12-21 DIAGNOSIS — I2510 Atherosclerotic heart disease of native coronary artery without angina pectoris: Secondary | ICD-10-CM | POA: Diagnosis present

## 2022-12-21 DIAGNOSIS — R1310 Dysphagia, unspecified: Secondary | ICD-10-CM | POA: Diagnosis present

## 2022-12-21 DIAGNOSIS — Z888 Allergy status to other drugs, medicaments and biological substances status: Secondary | ICD-10-CM

## 2022-12-21 DIAGNOSIS — Z833 Family history of diabetes mellitus: Secondary | ICD-10-CM

## 2022-12-21 DIAGNOSIS — I5032 Chronic diastolic (congestive) heart failure: Secondary | ICD-10-CM | POA: Diagnosis present

## 2022-12-21 DIAGNOSIS — D124 Benign neoplasm of descending colon: Secondary | ICD-10-CM | POA: Diagnosis present

## 2022-12-21 DIAGNOSIS — E876 Hypokalemia: Secondary | ICD-10-CM | POA: Diagnosis not present

## 2022-12-21 DIAGNOSIS — Z79891 Long term (current) use of opiate analgesic: Secondary | ICD-10-CM

## 2022-12-21 DIAGNOSIS — Z951 Presence of aortocoronary bypass graft: Secondary | ICD-10-CM

## 2022-12-21 DIAGNOSIS — E785 Hyperlipidemia, unspecified: Secondary | ICD-10-CM | POA: Diagnosis present

## 2022-12-21 DIAGNOSIS — I10 Essential (primary) hypertension: Secondary | ICD-10-CM | POA: Diagnosis not present

## 2022-12-21 DIAGNOSIS — E1122 Type 2 diabetes mellitus with diabetic chronic kidney disease: Secondary | ICD-10-CM | POA: Diagnosis present

## 2022-12-21 DIAGNOSIS — Z955 Presence of coronary angioplasty implant and graft: Secondary | ICD-10-CM

## 2022-12-21 DIAGNOSIS — G253 Myoclonus: Secondary | ICD-10-CM

## 2022-12-21 DIAGNOSIS — Z5986 Financial insecurity: Secondary | ICD-10-CM

## 2022-12-21 DIAGNOSIS — R55 Syncope and collapse: Secondary | ICD-10-CM | POA: Diagnosis present

## 2022-12-21 DIAGNOSIS — N184 Chronic kidney disease, stage 4 (severe): Secondary | ICD-10-CM | POA: Diagnosis present

## 2022-12-21 DIAGNOSIS — K209 Esophagitis, unspecified without bleeding: Secondary | ICD-10-CM | POA: Diagnosis present

## 2022-12-21 DIAGNOSIS — E1142 Type 2 diabetes mellitus with diabetic polyneuropathy: Secondary | ICD-10-CM | POA: Diagnosis present

## 2022-12-21 DIAGNOSIS — D7589 Other specified diseases of blood and blood-forming organs: Secondary | ICD-10-CM | POA: Diagnosis present

## 2022-12-21 DIAGNOSIS — Z9851 Tubal ligation status: Secondary | ICD-10-CM

## 2022-12-21 DIAGNOSIS — R410 Disorientation, unspecified: Secondary | ICD-10-CM

## 2022-12-21 DIAGNOSIS — I252 Old myocardial infarction: Secondary | ICD-10-CM

## 2022-12-21 DIAGNOSIS — Z7985 Long-term (current) use of injectable non-insulin antidiabetic drugs: Secondary | ICD-10-CM

## 2022-12-21 DIAGNOSIS — F17290 Nicotine dependence, other tobacco product, uncomplicated: Secondary | ICD-10-CM | POA: Diagnosis present

## 2022-12-21 DIAGNOSIS — G2581 Restless legs syndrome: Secondary | ICD-10-CM | POA: Diagnosis present

## 2022-12-21 DIAGNOSIS — Z7902 Long term (current) use of antithrombotics/antiplatelets: Secondary | ICD-10-CM

## 2022-12-21 DIAGNOSIS — Z9181 History of falling: Secondary | ICD-10-CM

## 2022-12-21 DIAGNOSIS — D509 Iron deficiency anemia, unspecified: Principal | ICD-10-CM | POA: Diagnosis present

## 2022-12-21 DIAGNOSIS — J8410 Pulmonary fibrosis, unspecified: Secondary | ICD-10-CM | POA: Diagnosis present

## 2022-12-21 DIAGNOSIS — D631 Anemia in chronic kidney disease: Secondary | ICD-10-CM | POA: Diagnosis present

## 2022-12-21 DIAGNOSIS — I130 Hypertensive heart and chronic kidney disease with heart failure and stage 1 through stage 4 chronic kidney disease, or unspecified chronic kidney disease: Secondary | ICD-10-CM | POA: Diagnosis present

## 2022-12-21 DIAGNOSIS — D638 Anemia in other chronic diseases classified elsewhere: Secondary | ICD-10-CM | POA: Diagnosis present

## 2022-12-21 DIAGNOSIS — G43909 Migraine, unspecified, not intractable, without status migrainosus: Secondary | ICD-10-CM | POA: Diagnosis present

## 2022-12-21 DIAGNOSIS — S0031XA Abrasion of nose, initial encounter: Secondary | ICD-10-CM | POA: Diagnosis present

## 2022-12-21 DIAGNOSIS — W19XXXA Unspecified fall, initial encounter: Secondary | ICD-10-CM | POA: Diagnosis present

## 2022-12-21 DIAGNOSIS — F419 Anxiety disorder, unspecified: Secondary | ICD-10-CM | POA: Diagnosis present

## 2022-12-21 DIAGNOSIS — Z7984 Long term (current) use of oral hypoglycemic drugs: Secondary | ICD-10-CM

## 2022-12-21 DIAGNOSIS — Z9049 Acquired absence of other specified parts of digestive tract: Secondary | ICD-10-CM

## 2022-12-21 DIAGNOSIS — E039 Hypothyroidism, unspecified: Secondary | ICD-10-CM | POA: Diagnosis present

## 2022-12-21 LAB — URINALYSIS, ROUTINE W REFLEX MICROSCOPIC
Bilirubin Urine: NEGATIVE
Glucose, UA: >=500 mg/dL — AB
Ketones, ur: NEGATIVE mg/dL
Nitrite: POSITIVE — AB
Protein, ur: 30 mg/dL — AB
Specific Gravity, Urine: 1.005 (ref 1.005–1.030)
WBC, UA: >50 WBC/hpf (ref 0–5)
pH: 6 (ref 5.0–8.0)

## 2022-12-21 LAB — LACTIC ACID, PLASMA
Lactic Acid, Venous: 0.9 mmol/L (ref 0.5–1.9)
Lactic Acid, Venous: 0.6 mmol/L (ref 0.5–1.9)

## 2022-12-21 LAB — CBC
HCT: 18.6 % — ABNORMAL LOW (ref 36.0–46.0)
Hemoglobin: 5.5 g/dL — CL (ref 12.0–15.0)
MCH: 20.9 pg — ABNORMAL LOW (ref 26.0–34.0)
MCHC: 29.6 g/dL — ABNORMAL LOW (ref 30.0–36.0)
MCV: 70.7 fL — ABNORMAL LOW (ref 80.0–100.0)
Platelets: 328 10*3/uL (ref 150–400)
RBC: 2.63 MIL/uL — ABNORMAL LOW (ref 3.87–5.11)
RDW: 18.8 % — ABNORMAL HIGH (ref 11.5–15.5)
WBC: 7 10*3/uL (ref 4.0–10.5)
nRBC: 0 % (ref 0.0–0.2)

## 2022-12-21 LAB — VITAMIN B12: Vitamin B-12: 277 pg/mL (ref 180–914)

## 2022-12-21 LAB — TROPONIN I (HIGH SENSITIVITY)
Troponin I (High Sensitivity): 5 ng/L (ref <18)
Troponin I (High Sensitivity): 5 ng/L (ref <18)

## 2022-12-21 LAB — IRON AND TIBC
Iron: 13 ug/dL — ABNORMAL LOW (ref 28–170)
Saturation Ratios: 4 % — ABNORMAL LOW (ref 10.4–31.8)
TIBC: 367 ug/dL (ref 250–450)
UIBC: 354 ug/dL

## 2022-12-21 LAB — BRAIN NATRIURETIC PEPTIDE: B Natriuretic Peptide: 46 pg/mL (ref 0.0–100.0)

## 2022-12-21 LAB — FOLATE: Folate: 5.5 ng/mL — ABNORMAL LOW (ref >5.9)

## 2022-12-21 LAB — GLUCOSE, CAPILLARY: Glucose-Capillary: 74 mg/dL (ref 70–99)

## 2022-12-21 LAB — COMPREHENSIVE METABOLIC PANEL
ALT: 14 U/L (ref 0–44)
AST: 15 U/L (ref 15–41)
Albumin: 3.6 g/dL (ref 3.5–5.0)
Alkaline Phosphatase: 67 U/L (ref 38–126)
Anion gap: 7 (ref 5–15)
BUN: 29 mg/dL — ABNORMAL HIGH (ref 8–23)
CO2: 19 mmol/L — ABNORMAL LOW (ref 22–32)
Calcium: 8.5 mg/dL — ABNORMAL LOW (ref 8.9–10.3)
Chloride: 106 mmol/L (ref 98–111)
Creatinine, Ser: 2.9 mg/dL — ABNORMAL HIGH (ref 0.44–1.00)
GFR, Estimated: 18 mL/min — ABNORMAL LOW (ref >60)
Glucose, Bld: 109 mg/dL — ABNORMAL HIGH (ref 70–99)
Potassium: 3.2 mmol/L — ABNORMAL LOW (ref 3.5–5.1)
Sodium: 132 mmol/L — ABNORMAL LOW (ref 135–145)
Total Bilirubin: 0.4 mg/dL (ref 0.3–1.2)
Total Protein: 6 g/dL — ABNORMAL LOW (ref 6.5–8.1)

## 2022-12-21 LAB — PREPARE RBC (CROSSMATCH)

## 2022-12-21 LAB — FERRITIN: Ferritin: 5 ng/mL — ABNORMAL LOW (ref 11–307)

## 2022-12-21 LAB — LIPASE, BLOOD: Lipase: 37 U/L (ref 11–51)

## 2022-12-21 LAB — MAGNESIUM: Magnesium: 2.3 mg/dL (ref 1.7–2.4)

## 2022-12-21 MED ORDER — ROPINIROLE HCL 1 MG PO TABS
4.0000 mg | ORAL_TABLET | Freq: Every day | ORAL | Status: DC
  Administered 2022-12-21 – 2022-12-24 (×4): 4 mg via ORAL
  Filled 2022-12-21 (×4): qty 4

## 2022-12-21 MED ORDER — ONDANSETRON HCL 4 MG/2ML IJ SOLN
4.0000 mg | Freq: Four times a day (QID) | INTRAMUSCULAR | Status: DC | PRN
  Administered 2022-12-21: 4 mg via INTRAVENOUS
  Filled 2022-12-21: qty 2

## 2022-12-21 MED ORDER — INSULIN ASPART 100 UNIT/ML IJ SOLN: 0.0000 [IU] | Freq: Three times a day (TID) | INTRAMUSCULAR | Status: DC

## 2022-12-21 MED ORDER — ONDANSETRON HCL 4 MG PO TABS: 4.0000 mg | ORAL_TABLET | Freq: Four times a day (QID) | ORAL | Status: DC | PRN

## 2022-12-21 MED ORDER — LEVOTHYROXINE SODIUM 75 MCG PO TABS
75.0000 ug | ORAL_TABLET | Freq: Every day | ORAL | Status: DC
  Administered 2022-12-22 – 2022-12-25 (×4): 75 ug via ORAL
  Filled 2022-12-21 (×4): qty 1

## 2022-12-21 MED ORDER — INSULIN ASPART 100 UNIT/ML IJ SOLN: 0.0000 [IU] | Freq: Every day | INTRAMUSCULAR | Status: DC

## 2022-12-21 MED ORDER — VITAMIN D 25 MCG (1000 UNIT) PO TABS
1000.0000 [IU] | ORAL_TABLET | Freq: Every day | ORAL | Status: DC
  Administered 2022-12-21 – 2022-12-24 (×3): 1000 [IU] via ORAL
  Filled 2022-12-21 (×4): qty 1

## 2022-12-21 MED ORDER — POTASSIUM CHLORIDE CRYS ER 20 MEQ PO TBCR
20.0000 meq | EXTENDED_RELEASE_TABLET | Freq: Every day | ORAL | Status: DC
  Administered 2022-12-21 – 2022-12-24 (×3): 20 meq via ORAL
  Filled 2022-12-21 (×4): qty 1

## 2022-12-21 MED ORDER — LACTATED RINGERS IV BOLUS
500.0000 mL | Freq: Once | INTRAVENOUS | Status: AC
  Administered 2022-12-21: 500 mL via INTRAVENOUS

## 2022-12-21 MED ORDER — CLONAZEPAM 0.5 MG PO TABS
0.5000 mg | ORAL_TABLET | Freq: Every day | ORAL | Status: DC
  Administered 2022-12-21 – 2022-12-24 (×4): 0.5 mg via ORAL
  Filled 2022-12-21 (×4): qty 1

## 2022-12-21 MED ORDER — POTASSIUM CHLORIDE CRYS ER 20 MEQ PO TBCR
40.0000 meq | EXTENDED_RELEASE_TABLET | Freq: Once | ORAL | Status: AC
  Administered 2022-12-21: 40 meq via ORAL
  Filled 2022-12-21: qty 2

## 2022-12-21 MED ORDER — BUPROPION HCL ER (XL) 300 MG PO TB24
300.0000 mg | ORAL_TABLET | Freq: Every day | ORAL | Status: DC
  Administered 2022-12-21 – 2022-12-24 (×3): 300 mg via ORAL
  Filled 2022-12-21 (×4): qty 1

## 2022-12-21 MED ORDER — SODIUM CHLORIDE 0.9% IV SOLUTION: Freq: Once | INTRAVENOUS | Status: DC

## 2022-12-21 MED ORDER — TOPIRAMATE 100 MG PO TABS
100.0000 mg | ORAL_TABLET | Freq: Two times a day (BID) | ORAL | Status: DC
  Administered 2022-12-21 – 2022-12-24 (×6): 100 mg via ORAL
  Filled 2022-12-21 (×7): qty 1

## 2022-12-21 MED ORDER — ROSUVASTATIN CALCIUM 20 MG PO TABS
40.0000 mg | ORAL_TABLET | Freq: Every day | ORAL | Status: DC
  Administered 2022-12-21 – 2022-12-24 (×3): 40 mg via ORAL
  Filled 2022-12-21 (×4): qty 2

## 2022-12-21 MED ORDER — ACETAMINOPHEN 650 MG RE SUPP: 650.0000 mg | Freq: Four times a day (QID) | RECTAL | Status: DC | PRN

## 2022-12-21 MED ORDER — ROPINIROLE HCL 1 MG PO TABS
1.0000 mg | ORAL_TABLET | Freq: Three times a day (TID) | ORAL | Status: DC
  Administered 2022-12-22 – 2022-12-25 (×9): 1 mg via ORAL
  Filled 2022-12-21 (×10): qty 1

## 2022-12-21 MED ORDER — TRAMADOL HCL 50 MG PO TABS
50.0000 mg | ORAL_TABLET | Freq: Two times a day (BID) | ORAL | Status: DC | PRN
  Administered 2022-12-21 – 2022-12-23 (×2): 50 mg via ORAL
  Filled 2022-12-21 (×2): qty 1

## 2022-12-21 MED ORDER — ACETAMINOPHEN 325 MG PO TABS: 650.0000 mg | ORAL_TABLET | Freq: Four times a day (QID) | ORAL | Status: DC | PRN

## 2022-12-21 MED ORDER — METOPROLOL SUCCINATE ER 25 MG PO TB24
25.0000 mg | ORAL_TABLET | Freq: Every day | ORAL | Status: DC
  Administered 2022-12-22: 25 mg via ORAL
  Filled 2022-12-21 (×2): qty 1

## 2022-12-21 MED ORDER — RANOLAZINE ER 500 MG PO TB12
500.0000 mg | ORAL_TABLET | Freq: Two times a day (BID) | ORAL | Status: DC
  Administered 2022-12-21 – 2022-12-24 (×6): 500 mg via ORAL
  Filled 2022-12-21 (×7): qty 1

## 2022-12-21 MED ORDER — TRAZODONE HCL 50 MG PO TABS
150.0000 mg | ORAL_TABLET | Freq: Every day | ORAL | Status: DC
  Administered 2022-12-21 – 2022-12-24 (×4): 150 mg via ORAL
  Filled 2022-12-21 (×4): qty 3

## 2022-12-21 NOTE — ED Provider Notes (Signed)
[facility] Provider Note   CSN: [id number] Arrival date & time: 12/21/22  1246     History  Chief Complaint  Patient presents with   Dizziness   Chest Pain    Meghan Terry is a 62 y.o. female.  HPI Patient presents for dizziness and generalized weakness over the past week.  Medical history includes HTN, CAD, HLD, CKD, CHF, migraine headaches, hypothyroidism, DM, RLS, depression, neuropathy.  Due to her ongoing symptoms, EMS was called to her home today.  When they arrived on scene, they noted low blood pressures that worsened with standing.  Although patient is prescribed blood pressure medications, she states that she has not taken them today.  At time of EMS arrival at her home, she did endorse some central chest pain.  She was given 324 of ASA and 2 SL NTG's.  She was given 500 cc of IVF.  She reports that the NTG's did help her chest pain.  Currently, she is chest pain-free.  Because of her weakness and dizziness, she has had falls.  This morning, she did have a fall.  She denies loss of consciousness.  When she fell, she was wearing glasses and glasses struck the bridge of her nose.  She denies any other areas of pain or suspected injury.  Patient currently endorses ongoing dizziness even at rest.  She has had some recent nosebleeds.  She denies any other sources of blood loss.  She denies any vomiting or diarrhea.  She states that she drinks lots of fluids but has a very poor appetite due to the food not tasting good.    Home Medications Prior to Admission medications   Medication Sig Start Date End Date Taking? Authorizing Provider  acetaminophen (TYLENOL) 500 MG tablet Take 2 tablets by mouth 3 times daily as needed for pain 12/08/21     aspirin EC 81 MG tablet Take 1 tablet (81 mg total) by mouth daily. 09/05/19   Revankar, Reita Cliche, MD  buPROPion (WELLBUTRIN XL) 300 MG 24 hr tablet Take 1 tablet (300 mg total) by mouth daily. 07/06/22      cholecalciferol (VITAMIN D3) 25 MCG (1000 UNIT) tablet Take 2 tablets (50 mcg total) by mouth daily. 08/12/22     clonazePAM (KLONOPIN) 0.5 MG tablet Take 1 tablet (0.5 mg total) by mouth at bedtime. 07/16/22     clonazePAM (KLONOPIN) 0.5 MG tablet Take 1 tablet (0.5 mg total) by mouth 3 (three) times daily. 11/03/22     clopidogrel (PLAVIX) 75 MG tablet TAKE 1 TABLET BY MOUTH DAILY 03/30/22   Revankar, Reita Cliche, MD  dapagliflozin propanediol (FARXIGA) 10 MG TABS tablet Take 1 tablet (10 mg total) by mouth daily before breakfast. 05/12/22   Revankar, Reita Cliche, MD  Dulaglutide (TRULICITY) 1.5 0000000 SOPN Inject 1.5 mg into the skin every 7 (seven) days. 07/06/22     Dulaglutide (TRULICITY) 1.5 0000000 SOPN Inject 1.5 mg into the skin once a week. 11/03/22     furosemide (LASIX) 40 MG tablet Take 1.5 tablets (60 mg total) by mouth 2 (two) times daily. 07/06/22     levothyroxine (SYNTHROID) 50 MCG tablet TAKE 1 TABLET BY MOUTH ONCE DAILY ON AN EMPTY STOMACH 30 MINUTES BEFORE BREAKFAST ON SAT AND SUNDAY 05/28/22     levothyroxine (SYNTHROID) 75 MCG tablet Take 1 tablet (75 mcg total) by mouth daily Monday thru Friday. 06/08/22     metoprolol succinate (TOPROL-XL) 25 MG 24 hr tablet  Take 1 tablet (25 mg total) by mouth at bedtime. Take with or immediately following a meal. 09/17/22   Revankar, Reita Cliche, MD  nitroGLYCERIN (NITROSTAT) 0.4 MG SL tablet Place 1 tablet (0.4 mg total) under the tongue every 5 (five) minutes as needed for chest pain. 12/29/21   Revankar, Reita Cliche, MD  ondansetron (ZOFRAN) 4 MG tablet take 1 tablet (4 mg) by mouth 2 times per day as needed for nausea 05/12/22     Potassium Chloride ER 20 MEQ TBCR Take 1 tablet (20 mEq total) by mouth daily. 11/12/22     promethazine (PHENERGAN) 25 MG tablet take 1 tablet (25 mg) by mouth every 6 hours as needed for 30 days 07/30/22     ranolazine (RANEXA) 500 MG 12 hr tablet Take 1 tablet by mouth 2 times daily 12/25/21   Revankar, Reita Cliche, MD  rOPINIRole  (REQUIP) 1 MG tablet Take 1 tablet (1 mg total) by mouth in the morning, at noon, and at bedtime. 11/03/22     rOPINIRole (REQUIP) 1 MG tablet Take 1 tablet (1 mg total) by mouth 3 (three) times daily. 11/12/22     rOPINIRole (REQUIP) 4 MG tablet Take 1 tablet (4 mg total) by mouth 1 to 3 hours before bedtime. 06/29/22     rosuvastatin (CRESTOR) 40 MG tablet Take 1 tablet (40 mg total) by mouth daily. 07/27/22     topiramate (TOPAMAX) 100 MG tablet TAKE 1 TABLET BY MOUTH TWICE DAILY. 05/12/22     traMADol (ULTRAM) 50 MG tablet Take 1 tablet by mouth every 12 hours as needed for pain Patient not taking: Reported on 09/17/2022 03/23/22     traMADol (ULTRAM) 50 MG tablet Take 1 tablet (50 mg total) by mouth every 12 (twelve) hours as needed for pain. 07/29/22     traMADol (ULTRAM) 50 MG tablet Take 1 tablet (50 mg total) by mouth every 6 (six) hours. 11/03/22     traZODone (DESYREL) 150 MG tablet Take 1 tablet (150 mg total) by mouth daily. 05/28/22     traZODone (DESYREL) 150 MG tablet Take 1 tablet (150 mg total) by mouth in the morning and at bedtime. 11/16/22     traZODone (DESYREL) 50 MG tablet Take 1 tablet (50 mg total) by mouth daily. 11/03/22         Allergies    Abilify [aripiprazole], Ciprofloxacin, Carbidopa-levodopa, Prednisone, and Venlafaxine    Review of Systems   Review of Systems  Constitutional:  Positive for fatigue.  HENT:  Positive for nosebleeds.   Cardiovascular:  Positive for chest pain.  Neurological:  Positive for dizziness, weakness (Generalized) and light-headedness.  All other systems reviewed and are negative.   Physical Exam Updated Vital Signs BP (!) 117/97   Pulse 86   Resp 18   Wt 59.9 kg   SpO2 100%   BMI 23.38 kg/m  Physical Exam Vitals and nursing note reviewed.  Constitutional:      General: She is not in acute distress.    Appearance: Normal appearance. She is well-developed. She is not ill-appearing, toxic-appearing or diaphoretic.  HENT:     Head:  Normocephalic and atraumatic.     Right Ear: External ear normal.     Left Ear: External ear normal.     Nose: Nose normal.     Mouth/Throat:     Mouth: Mucous membranes are moist.  Eyes:     Extraocular Movements: Extraocular movements intact.     Conjunctiva/sclera: Conjunctivae normal.  Cardiovascular:     Rate and Rhythm: Normal rate and regular rhythm.     Heart sounds: No murmur heard. Pulmonary:     Effort: Pulmonary effort is normal. No respiratory distress.     Breath sounds: Normal breath sounds. No wheezing or rales.  Abdominal:     General: There is no distension.     Palpations: Abdomen is soft.     Tenderness: There is no abdominal tenderness.  Musculoskeletal:        General: No swelling. Normal range of motion.     Cervical back: Normal range of motion and neck supple.     Right lower leg: No edema.     Left lower leg: No edema.  Skin:    General: Skin is warm and dry.     Capillary Refill: Capillary refill takes less than 2 seconds.     Coloration: Skin is not jaundiced or pale.  Neurological:     General: No focal deficit present.     Mental Status: She is alert and oriented to person, place, and time.     Cranial Nerves: No cranial nerve deficit.     Sensory: No sensory deficit.     Motor: No weakness.     Coordination: Coordination normal.     Comments: Intermittent choreic movements in her legs which she states is chronic and attributes to her RLS.  Psychiatric:        Mood and Affect: Mood normal.        Behavior: Behavior normal.        Thought Content: Thought content normal.        Judgment: Judgment normal.     ED Results / Procedures / Treatments   Labs (all labs ordered are listed, but only abnormal results are displayed) Labs Reviewed  CBC - Abnormal; Notable for the following components:      Result Value   RBC 2.63 (*)    Hemoglobin 5.5 (*)    HCT 18.6 (*)    MCV 70.7 (*)    MCH 20.9 (*)    MCHC 29.6 (*)    RDW 18.8 (*)    All  other components within normal limits  COMPREHENSIVE METABOLIC PANEL - Abnormal; Notable for the following components:   Sodium 132 (*)    Potassium 3.2 (*)    CO2 19 (*)    Glucose, Bld 109 (*)    BUN 29 (*)    Creatinine, Ser 2.90 (*)    Calcium 8.5 (*)    Total Protein 6.0 (*)    GFR, Estimated 18 (*)    All other components within normal limits  MAGNESIUM  LIPASE, BLOOD  BRAIN NATRIURETIC PEPTIDE  LACTIC ACID, PLASMA  URINALYSIS, ROUTINE W REFLEX MICROSCOPIC  LACTIC ACID, PLASMA  TYPE AND SCREEN  PREPARE RBC (CROSSMATCH)  TROPONIN I (HIGH SENSITIVITY)  TROPONIN I (HIGH SENSITIVITY)    EKG EKG Interpretation  Date/Time:  Monday December 21 2022 12:59:07 EDT Ventricular Rate:  92 PR Interval:  138 QRS Duration: 92 QT Interval:  353 QTC Calculation: 437 R Axis:   80 Text Interpretation: Sinus rhythm Nonspecific repol abnormality, diffuse leads Confirmed by Godfrey Pick ([id number]) on 12/21/2022 2:14:22 PM  Radiology CT Head Wo Contrast  Result Date: 12/21/2022 CLINICAL DATA:  Syncope, multiple falls EXAM: CT HEAD WITHOUT CONTRAST CT CERVICAL SPINE WITHOUT CONTRAST TECHNIQUE: Multidetector CT imaging of the head and cervical spine was performed following the standard protocol without intravenous contrast. Multiplanar CT image  reconstructions of the cervical spine were also generated. RADIATION DOSE REDUCTION: This exam was performed according to the departmental dose-optimization program which includes automated exposure control, adjustment of the mA and/or kV according to patient size and/or use of iterative reconstruction technique. COMPARISON:  04/29/2022 FINDINGS: CT HEAD FINDINGS Brain: No evidence of acute infarction, hemorrhage, hydrocephalus, extra-axial collection or mass lesion/mass effect. Mild periventricular white matter hypodensity. Vascular: No hyperdense vessel or unexpected calcification. Skull: Normal. Negative for fracture or focal lesion. Sinuses/Orbits: No acute  finding. Other: None. CT CERVICAL SPINE FINDINGS Alignment: Normal. Skull base and vertebrae: No acute fracture. No primary bone lesion or focal pathologic process. Soft tissues and spinal canal: No prevertebral fluid or swelling. No visible canal hematoma. Disc levels: Generally mild multilevel disc space height loss and osteophytosis, focally moderate at C5-C6. Upper chest: Negative. Other: None. IMPRESSION: 1. No acute intracranial pathology. Small-vessel white matter disease. 2. No fracture or static subluxation of the cervical spine. 3. Generally mild multilevel disc space height loss and osteophytosis, focally moderate at C5-C6. Electronically Signed   By: Delanna Ahmadi M.D.   On: 12/21/2022 14:28   CT Cervical Spine Wo Contrast  Result Date: 12/21/2022 CLINICAL DATA:  Syncope, multiple falls EXAM: CT HEAD WITHOUT CONTRAST CT CERVICAL SPINE WITHOUT CONTRAST TECHNIQUE: Multidetector CT imaging of the head and cervical spine was performed following the standard protocol without intravenous contrast. Multiplanar CT image reconstructions of the cervical spine were also generated. RADIATION DOSE REDUCTION: This exam was performed according to the departmental dose-optimization program which includes automated exposure control, adjustment of the mA and/or kV according to patient size and/or use of iterative reconstruction technique. COMPARISON:  04/29/2022 FINDINGS: CT HEAD FINDINGS Brain: No evidence of acute infarction, hemorrhage, hydrocephalus, extra-axial collection or mass lesion/mass effect. Mild periventricular white matter hypodensity. Vascular: No hyperdense vessel or unexpected calcification. Skull: Normal. Negative for fracture or focal lesion. Sinuses/Orbits: No acute finding. Other: None. CT CERVICAL SPINE FINDINGS Alignment: Normal. Skull base and vertebrae: No acute fracture. No primary bone lesion or focal pathologic process. Soft tissues and spinal canal: No prevertebral fluid or swelling. No  visible canal hematoma. Disc levels: Generally mild multilevel disc space height loss and osteophytosis, focally moderate at C5-C6. Upper chest: Negative. Other: None. IMPRESSION: 1. No acute intracranial pathology. Small-vessel white matter disease. 2. No fracture or static subluxation of the cervical spine. 3. Generally mild multilevel disc space height loss and osteophytosis, focally moderate at C5-C6. Electronically Signed   By: Delanna Ahmadi M.D.   On: 12/21/2022 14:28   CT CHEST ABDOMEN PELVIS WO CONTRAST  Result Date: 12/21/2022 CLINICAL DATA:  Multiple falls, chest pain EXAM: CT CHEST, ABDOMEN AND PELVIS WITHOUT CONTRAST TECHNIQUE: Multidetector CT imaging of the chest, abdomen and pelvis was performed following the standard protocol without IV contrast. RADIATION DOSE REDUCTION: This exam was performed according to the departmental dose-optimization program which includes automated exposure control, adjustment of the mA and/or kV according to patient size and/or use of iterative reconstruction technique. COMPARISON:  CT chest, 06/16/2022 FINDINGS: CT CHEST FINDINGS Cardiovascular: Aortic atherosclerosis. Normal heart size. Extensive three-vessel coronary artery calcifications and or stents status post median sternotomy and CABG. Mediastinum/Nodes: No enlarged mediastinal, hilar, or axillary lymph nodes. Thyroid gland, trachea, and esophagus demonstrate no significant findings. Lungs/Pleura: Mild pulmonary fibrosis in a pattern with apical to basal gradient featuring irregular peripheral interstitial opacity and ground-glass, with some evidence of subpleural sparing. No pleural effusion or pneumothorax. Musculoskeletal: No chest wall abnormality. No acute osseous  findings. CT ABDOMEN PELVIS FINDINGS Hepatobiliary: No focal liver abnormality is seen. Status post cholecystectomy. No biliary dilatation. Pancreas: Unremarkable. No pancreatic ductal dilatation or surrounding inflammatory changes. Spleen:  Normal in size without significant abnormality. Adrenals/Urinary Tract: Adrenal glands are unremarkable. Kidneys are normal, without renal calculi, solid lesion, or hydronephrosis. Bladder is unremarkable. Stomach/Bowel: Stomach is within normal limits. Appendix is not clearly visualized. No evidence of bowel wall thickening, distention, or inflammatory changes. Moderate burden of stool and stool balls throughout the colon and rectum. Vascular/Lymphatic: Aortic atherosclerosis. No enlarged abdominal or pelvic lymph nodes. Reproductive: Status post hysterectomy. Other: No abdominal wall hernia or abnormality. No ascites. Musculoskeletal: No acute osseous findings. IMPRESSION: 1. No noncontrast CT evidence of acute traumatic injury to the chest, abdomen, or pelvis. 2. Mild pulmonary fibrosis in a pattern with apical to basal gradient featuring irregular peripheral interstitial opacity and ground-glass, with some evidence of subpleural sparing. 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 3. Coronary artery disease. 4. Status post cholecystectomy, hysterectomy, and CABG. 5. Moderate burden of stool and stool balls throughout the colon and rectum. Aortic Atherosclerosis (ICD10-I70.0). Electronically Signed   By: Delanna Ahmadi M.D.   On: 12/21/2022 14:25   DG Chest Portable 1 View  Result Date: 12/21/2022 CLINICAL DATA:  Dizziness with several falls and central chest pain EXAM: PORTABLE CHEST 1 VIEW COMPARISON:  Chest radiograph dated 05/02/2022 FINDINGS: Normal lung volumes. Similar peripheral reticulations. No pleural effusion or pneumothorax. Similar postsurgical cardiomediastinal silhouette. Median sternotomy wires are nondisplaced. No radiographic finding of acute displaced fracture. Right upper quadrant surgical clips. IMPRESSION: 1. No  active disease. 2.  No radiographic finding of acute displaced fracture. Electronically Signed   By: Darrin Nipper M.D.   On: 12/21/2022 13:38    Procedures Procedures    Medications Ordered in ED Medications  0.9 %  sodium chloride infusion (Manually program via Guardrails IV Fluids) (has no administration in time range)  potassium chloride SA (KLOR-CON M) CR tablet 40 mEq (has no administration in time range)  lactated ringers bolus 500 mL (500 mLs Intravenous New Bag/Given 12/21/22 1343)    ED Course/ Medical Decision Making/ A&P                             Medical Decision Making Amount and/or Complexity of Data Reviewed Labs: ordered. Radiology: ordered.  Risk Prescription drug management.   This patient presents to the ED for concern of dizziness and generalized weakness, this involves an extensive number of treatment options, and is a complaint that carries with it a high risk of complications and morbidity.  The differential diagnosis includes polypharmacy, dehydration, anemia, arrhythmia, CHF, infection, metabolic derangements   Co morbidities that complicate the patient evaluation  HTN, CAD, HLD, CKD, CHF, migraine headaches, hypothyroidism, DM, RLS, depression, neuropathy   Additional history obtained:  Additional history obtained from N/A External records from outside source obtained and reviewed including EMR   Lab Tests:  I Ordered, and personally interpreted labs.  The pertinent results include: Acute on chronic anemia with hemoglobin today of 5.5.  There is a new microcytosis when compared to lab work from last year.  Creatinine is baseline.  Mild hypokalemia is present.  A non-anion gap metabolic acidosis is present.  Imaging Studies ordered:  I  ordered imaging studies including CT of head, cervical spine, chest, abdomen, pelvis I independently visualized and interpreted imaging which showed no evidence of traumatic injuries.  Pulmonary fibrosis is present.   Moderate stool burden is present. I agree with the radiologist interpretation   Cardiac Monitoring: / EKG:  The patient was maintained on a cardiac monitor.  I personally viewed and interpreted the cardiac monitored which showed an underlying rhythm of: Sinus rhythm  Problem List / ED Course / Critical interventions / Medication management  Patient presents for ongoing dizziness and generalized weakness over the past week, present at rest but worse with standing.  Additionally, she did have some new onset chest pain this morning and did receive ASA and NTG prior to arrival.  EMS did note hypotension on scene, however, patient's low blood pressure seems to have resolved on arrival.  She denies any recent fluid losses.  She has had some nosebleeds.  Currently, she is alert and oriented.  No focal neurologic deficits are appreciated on exam.  She has a small abrasion to the bridge of her nose from her fall today.  There is no underlying swelling.  She has no other areas of deformity or tenderness.  Patient was placed on bedside cardiac monitor.  EKG does not show concerning ST segment changes.  Broad diagnostic workup was initiated.  CBC shows acute on chronic anemia with current hemoglobin of 5.5.  There is a new microcytosis when compared to lab work from last year.  Because of the symptomatic anemia is, at least in part, due to recent nosebleeds.  Patient describes nosebleeds as persistent and frequent 2 nights ago.  She denies any nosebleeds since that time.  Patient has not noticed any change in the color of her stools but does state that she has not had a bowel movement in the past week.  She has been taking a bowel regimen to help with her constipation.  At baseline, she does not go every day but typically goes several times per week.  On DRE, there is no impacted stool.  Fecal occult testing was negative.  In addition to recent blood loss, patient does have advanced CKD and new macrocytosis suggest  some chronicity to her worsened anemia.  I do suspect that her recent symptoms are secondary to this anemia.  It is notable that her near syncopal symptoms started prior to her recent nosebleeds.  2 units of PRBCs was ordered.  CT imaging showed moderate stool burden without other acute findings.  Mild pulmonary fibrosis was noted.  Patient's other lab work is notable for hypokalemia.  Replacement potassium was ordered.  Initial troponin is normal.  Given her symptomatic anemia, patient to be admitted to medicine for further management. I ordered medication including IV fluids for duration; potassium chloride for hypokalemia; PRBCs for symptomatic anemia Reevaluation of the patient after these medicines showed that the patient improved I have reviewed the patients home medicines and have made adjustments as needed   Social Determinants of Health:  Has access to outpatient care  CRITICAL CARE Performed by: Godfrey Pick   Total critical care time: 35 minutes  Critical care time was exclusive of separately billable procedures and treating other patients.  Critical care was necessary to treat or prevent imminent or life-threatening deterioration.  Critical care was time spent personally by me on the following activities: development of treatment plan with patient and/or surrogate as well as nursing, discussions with consultants, evaluation of patient's response to treatment, examination of  patient, obtaining history from patient or surrogate, ordering and performing treatments and interventions, ordering and review of laboratory studies, ordering and review of radiographic studies, pulse oximetry and re-evaluation of patient's condition.         Final Clinical Impression(s) / ED Diagnoses Final diagnoses:  Symptomatic anemia    Rx / DC Orders ED Discharge Orders     None         Godfrey Pick, MD 12/21/22 1451

## 2022-12-21 NOTE — ED Notes (Signed)
Pt unable to urinate at this time.  

## 2022-12-21 NOTE — ED Triage Notes (Signed)
Per EMS, pt, from home, c/o dizziness, several falls, and nosebleeds x over a week and central chest pain starting this afternoon.  Currently, denies pain.  Pt reports hitting head/face during fall this morning.  Pt reports taking Plavix.    EMS reports Pt was orthostatic.  324mg  Aspirin, Nitro x2, and 517mL NS given prior to arrival.

## 2022-12-21 NOTE — Progress Notes (Signed)
Walked with patient to the bathroom. Tolerated well. Plan of care ongoing.

## 2022-12-21 NOTE — H&P (Signed)
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  The patient states that every time she stands up from a recumbent position she has dizziness and has near syncopal episodes.  She does have some chest discomfort and increasing dyspnea on exertion.  She states that her shortness of breath has gradually worsened with minimal exertion.  She denies any fevers, chills, cough, hemoptysis, nausea, vomiting, diarrhea, hematochezia, melena.  There is no hematuria.  The patient states that she did have a few episodes of epistaxis on 12/19/2022.  However, she has had episodes of presyncope and dizziness even prior to that.  She denies any new medications.  She states she had a colonoscopy about 2 years ago in [facility].  Apparently was normal.  She continues on aspirin and Plavix for coronary artery disease. In the ED, the patient was afebrile and hemodynamically stable with oxygen saturation 100% room air.  WBC 7.0, hemoglobin 5.5, platelets 320,000.  Sodium 132, potassium 3.2, bicarbonate 19, serum creatinine 2.90.  LFTs were unremarkable.  CT brain was negative for any acute findings.  CT cervical spine was negative for fracture or subluxation.  Chest x-ray was negative for any infiltrates or edema.  2 units PRBC were ordered.  FOBT was negative.  Review of Systems: As mentioned in the history of present illness. All other systems reviewed and are negative. Past  Medical History:  Diagnosis Date   Acute diastolic CHF (congestive heart failure) ([place]) 06/08/2021   Acute pulmonary edema (H[facility])    Anemia due to stage 4 chronic kidney disease ([place]) 07/20/2022   Angina pectoris ([place]) 11/17/2019   Anxiety    Atypical chest pain 12/30/2020   Benign hypertension with CKD (chronic kidney disease) stage IV ([place]) 07/20/2022   CHF (congestive heart failure) ([place]) 06/08/2021   CKD (chronic kidney disease) stage 3b, GFR 30-59 ml/min 12/03/2019   Coronary artery disease    Coronary artery disease involving native coronary artery of native heart with angina pectoris ([place]) 10/03/2019   Depression 04/25/2017   Diabetes mellitus due to underlying condition with unspecified complications ([place]) [id number]   Diabetes mellitus with stage 4 chronic kidney disease GFR 15-29 ([place]) 07/20/2022   Essential hypertension 09/05/2019   Ex-smoker 09/05/2019   Family history of coronary artery disease 09/05/2019   History of depression 04/15/2020   Hx of diabetes mellitus 04/15/2020   Hyperlipidemia with target LDL less than 70 11/24/2019   Hyperphosphatemia 07/20/2022   Hypertension    Hypokalemia 04/30/2022   Hypothyroid    Migraine 04/25/2017   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   Syncope and collapse 04/29/2022   Thyroid  disease    UTI (urinary tract infection) 04/30/2022   Past Surgical History:  Procedure Laterality Date   ABDOMINAL HYSTERECTOMY     APPENDECTOMY     CARDIAC CATHETERIZATION  11/22/2019   CHOLECYSTECTOMY     COLONOSCOPY     CORONARY ARTERY BYPASS GRAFT N/A 09/15/2019   Procedure: CORONARY ARTERY BYPASS GRAFTING (CABG) times three on pump  using left internal mammary artery and right and left greater saphenous veins harvested endoscopically;  Surgeon: Gaye Pollack, MD;  Location: [place];  Service: Open Heart Surgery;  Laterality: N/A;   CORONARY STENT INTERVENTION N/A 11/23/2019   Procedure: CORONARY STENT INTERVENTION;  Surgeon: [name], MD;  Location: [place] [facility];  Service: Cardiovascular;  Laterality: N/A;   KNEE ARTHROSCOPY     LEFT HEART CATH AND CORONARY ANGIOGRAPHY N/A 09/14/2019   Procedure: LEFT HEART CATH AND CORONARY ANGIOGRAPHY;  Surgeon: [name], MD;  Location: [place] [facility];  Service: Cardiovascular;  Laterality: N/A;   LEFT HEART CATH AND CORS/GRAFTS ANGIOGRAPHY N/A 11/22/2019   Procedure: LEFT HEART CATH AND CORS/GRAFTS ANGIOGRAPHY;  Surgeon: [name], MD;  Location: [place] [facility];  Service: Cardiovascular;  Laterality: N/A;   LEFT HEART CATH AND CORS/GRAFTS ANGIOGRAPHY N/A 03/13/2020   Procedure: LEFT HEART CATH AND CORS/GRAFTS ANGIOGRAPHY;  Surgeon: Lorretta Harp, MD;  Location: [place] [facility];  Service: Cardiovascular;  Laterality: N/A;   LEFT HEART CATH AND CORS/GRAFTS ANGIOGRAPHY N/A 12/31/2020   Procedure: LEFT HEART CATH AND CORS/GRAFTS ANGIOGRAPHY;  Surgeon: Belva Crome, MD;  Location: [place] [facility];  Service: Cardiovascular;  Laterality: N/A;   OSTEOCHONDRAL DEFECT REPAIR/RECONSTRUCTION Left 11/29/2014   Procedure: LEFT ANKLE MEDIAL MALLEOLUS OSTEOTOMY,AUTO GRAFT FROM CALCANEOUS;  OS TIBIA DENORO GRAFTING TALUS;  Surgeon: Wylene Simmer, MD;  Location: [place];  Service: Orthopedics;  Laterality: Left;   TEE WITHOUT CARDIOVERSION N/A 09/15/2019   Procedure: TRANSESOPHAGEAL ECHOCARDIOGRAM (TEE);  Surgeon: Gaye Pollack, MD;  Location: [place];  Service: Open Heart Surgery;  Laterality: N/A;   TUBAL LIGATION     Social History:  reports that she quit smoking about 3 years ago. Her smoking use included cigarettes. She has a 47.00 pack-year  smoking history. She has been exposed to tobacco smoke. She has never used smokeless tobacco. She reports that she does not drink alcohol and does not use drugs.  Allergies  Allergen Reactions   Abilify [Aripiprazole]    Ciprofloxacin Nausea And Vomiting   Carbidopa-Levodopa Other (See Comments)    Developed tics while taking    Prednisone Other (See Comments)    Turns red all over    Venlafaxine Other (See Comments)    Developed tics while taking - reaction to Effexor     Family History  Problem Relation Age of Onset   Asthma Mother    COPD Mother    Diabetes Mother    Macular degeneration Mother    Congestive Heart Failure Father     Prior to Admission medications   Medication Sig Start Date End Date Taking? Authorizing Provider  acetaminophen (TYLENOL) 500 MG tablet Take 2 tablets by mouth 3 times daily as needed for pain 12/08/21     aspirin EC 81 MG tablet Take 1 tablet (81 mg total) by mouth daily. 09/05/19   Revankar, Reita Cliche, MD  buPROPion (WELLBUTRIN XL) 300 MG 24 hr tablet Take 1 tablet (300 mg total) by mouth daily. 07/06/22  cholecalciferol (VITAMIN D3) 25 MCG (1000 UNIT) tablet Take 2 tablets (50 mcg total) by mouth daily. 08/12/22     clonazePAM (KLONOPIN) 0.5 MG tablet Take 1 tablet (0.5 mg total) by mouth at bedtime. 07/16/22     clonazePAM (KLONOPIN) 0.5 MG tablet Take 1 tablet (0.5 mg total) by mouth 3 (three) times daily. 11/03/22     clopidogrel (PLAVIX) 75 MG tablet TAKE 1 TABLET BY MOUTH DAILY 03/30/22   Revankar, Reita Cliche, MD  dapagliflozin propanediol (FARXIGA) 10 MG TABS tablet Take 1 tablet (10 mg total) by mouth daily before breakfast. 05/12/22   Revankar, Reita Cliche, MD  Dulaglutide (TRULICITY) 1.5 0000000 SOPN Inject 1.5 mg into the skin every 7 (seven) days. 07/06/22     Dulaglutide (TRULICITY) 1.5 0000000 SOPN Inject 1.5 mg into the skin once a week. 11/03/22     furosemide (LASIX) 40 MG tablet Take 1.5 tablets (60 mg total) by mouth 2 (two) times daily.  07/06/22     levothyroxine (SYNTHROID) 50 MCG tablet TAKE 1 TABLET BY MOUTH ONCE DAILY ON AN EMPTY STOMACH 30 MINUTES BEFORE BREAKFAST ON SAT AND SUNDAY 05/28/22     levothyroxine (SYNTHROID) 75 MCG tablet Take 1 tablet (75 mcg total) by mouth daily Monday thru Friday. 06/08/22     metoprolol succinate (TOPROL-XL) 25 MG 24 hr tablet Take 1 tablet (25 mg total) by mouth at bedtime. Take with or immediately following a meal. 09/17/22   Revankar, Reita Cliche, MD  nitroGLYCERIN (NITROSTAT) 0.4 MG SL tablet Place 1 tablet (0.4 mg total) under the tongue every 5 (five) minutes as needed for chest pain. 12/29/21   Revankar, Reita Cliche, MD  ondansetron (ZOFRAN) 4 MG tablet take 1 tablet (4 mg) by mouth 2 times per day as needed for nausea 05/12/22     Potassium Chloride ER 20 MEQ TBCR Take 1 tablet (20 mEq total) by mouth daily. 11/12/22     promethazine (PHENERGAN) 25 MG tablet take 1 tablet (25 mg) by mouth every 6 hours as needed for 30 days 07/30/22     ranolazine (RANEXA) 500 MG 12 hr tablet Take 1 tablet by mouth 2 times daily 12/25/21   Revankar, Reita Cliche, MD  rOPINIRole (REQUIP) 1 MG tablet Take 1 tablet (1 mg total) by mouth in the morning, at noon, and at bedtime. 11/03/22     rOPINIRole (REQUIP) 1 MG tablet Take 1 tablet (1 mg total) by mouth 3 (three) times daily. 11/12/22     rOPINIRole (REQUIP) 4 MG tablet Take 1 tablet (4 mg total) by mouth 1 to 3 hours before bedtime. 06/29/22     rosuvastatin (CRESTOR) 40 MG tablet Take 1 tablet (40 mg total) by mouth daily. 07/27/22     topiramate (TOPAMAX) 100 MG tablet TAKE 1 TABLET BY MOUTH TWICE DAILY. 05/12/22     traMADol (ULTRAM) 50 MG tablet Take 1 tablet by mouth every 12 hours as needed for pain Patient not taking: Reported on 09/17/2022 03/23/22     traMADol (ULTRAM) 50 MG tablet Take 1 tablet (50 mg total) by mouth every 12 (twelve) hours as needed for pain. 07/29/22     traMADol (ULTRAM) 50 MG tablet Take 1 tablet (50 mg total) by mouth every 6 (six) hours. 11/03/22      traZODone (DESYREL) 150 MG tablet Take 1 tablet (150 mg total) by mouth daily. 05/28/22     traZODone (DESYREL) 150 MG tablet Take 1 tablet (150 mg total) by mouth in the morning  and at bedtime. 11/16/22     traZODone (DESYREL) 50 MG tablet Take 1 tablet (50 mg total) by mouth daily. 11/03/22       Physical Exam: Vitals:   12/21/22 1306 12/21/22 1311 12/21/22 1330 12/21/22 1400  BP:  131/62 (!) 89/64 (!) 117/97  Pulse:  90 86   Resp:  17 16 18   TempSrc:  Oral    SpO2: 99% 100% 100%   Weight:  59.9 kg     GENERAL:  A&O x 3, NAD, well developed, cooperative, follows commands HEENT: [place]/AT, No thrush, No icterus, No oral ulcers Neck:  No neck mass, No meningismus, soft, supple CV: RRR, no S3, no S4, no rub, no JVD Lungs:  diminished BS but CTA, no wheeze, no rhonchi, good air movement Abd: soft/NT +BS, nondistended Ext: No edema, no lymphangitis, no cyanosis, no rashes Neuro:  CN II-XII intact, strength 4/5 in RUE, RLE, strength 4/5 LUE, LLE; sensation intact bilateral; no dysmetria; babinski equivocal  Data Reviewed: Data reviewed in history above  Assessment and Plan: Symptomatic anemia -Check iron studies -Transfusing 2 units PRBC -Suspect iron deficiency -GI consult -Baseline hemoglobin ~9 -Presented with hemoglobin 5.5 -GI consult  CKD stage IV -Baseline creatinine 2.9-3.1 -Monitor BMP  Coronary artery disease -Status post CABG times 11/2018 -troponin 5>> -Holding aspirin and Plavix temporarily -Continue metoprolol succinate  Chronic diastolic CHF -Clinically euvolemic -04/30/2022 echo EF 60-65%, no WMA, trivial MR, mild decrease RVF -Holding furosemide temporarily  Microcytic anemia -Check iron studies  Hyperlipidemia -Continue Crestor  Anxiety/depression -Continue home dose of Klonopin and Wellbutrin  Essential hypertension -Continue metoprolol succinate  Diabetes mellitus type 2, controlled -04/30/2022 hemoglobin A1c 7.0 -Repeat hemoglobin [id number] -Holding  Trulicity and Farxiga temporarily -NovoLog sliding scale  Hypothyroidism -Continue Synthroid  Restless leg syndrome -Continue Requip    Advance Care Planning: FULL  Consults: GI  Family Communication: none  Severity of Illness: The appropriate patient status for this patient is OBSERVATION. Observation status is judged to be reasonable and necessary in order to provide the required intensity of service to ensure the patient's safety. The patient's presenting symptoms, physical exam findings, and initial radiographic and laboratory data in the context of their medical condition is felt to place them at decreased risk for further clinical deterioration. Furthermore, it is anticipated that the patient will be medically stable for discharge from the hospital within 2 midnights of admission.   Author: Orson Eva, MD 12/21/2022 3:30 PM  For on call review [email or web address].

## 2022-12-21 NOTE — Hospital Course (Addendum)
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  The patient states that every time she stands up from a recumbent position she has dizziness and has near syncopal episodes.  She does have some chest discomfort and increasing dyspnea on exertion.  She states that her shortness of breath has gradually worsened with minimal exertion.  She denies any fevers, chills, cough, hemoptysis, nausea, vomiting, diarrhea, hematochezia, melena.  There is no hematuria.  The patient states that she did have a few episodes of epistaxis on 12/19/2022.  However, she has had episodes of presyncope and dizziness even prior to that.  She denies any new medications.  She states she had a colonoscopy about 2 years ago in [facility].  Apparently was normal.  She continues on aspirin and Plavix for coronary artery disease. In the ED, the patient was afebrile and hemodynamically stable with oxygen saturation 100% room air.  WBC 7.0, hemoglobin 5.5, platelets 320,000.  Sodium 132, potassium 3.2, bicarbonate 19, serum creatinine 2.90.  LFTs were unremarkable.  CT brain was negative for any acute findings.  CT cervical spine was negative for fracture or subluxation.  Chest x-ray was negative for any infiltrates or edema.  2 units PRBC were ordered.  FOBT was negative.

## 2022-12-21 NOTE — ED Notes (Signed)
Patient transported to CT 

## 2022-12-22 ENCOUNTER — Observation Stay (HOSPITAL_COMMUNITY): Payer: Medicare Other

## 2022-12-22 DIAGNOSIS — D649 Anemia, unspecified: Secondary | ICD-10-CM | POA: Diagnosis not present

## 2022-12-22 DIAGNOSIS — R079 Chest pain, unspecified: Secondary | ICD-10-CM

## 2022-12-22 DIAGNOSIS — I10 Essential (primary) hypertension: Secondary | ICD-10-CM | POA: Diagnosis not present

## 2022-12-22 DIAGNOSIS — E876 Hypokalemia: Secondary | ICD-10-CM | POA: Diagnosis not present

## 2022-12-22 DIAGNOSIS — E538 Deficiency of other specified B group vitamins: Secondary | ICD-10-CM

## 2022-12-22 DIAGNOSIS — K5904 Chronic idiopathic constipation: Secondary | ICD-10-CM | POA: Diagnosis not present

## 2022-12-22 DIAGNOSIS — D500 Iron deficiency anemia secondary to blood loss (chronic): Secondary | ICD-10-CM

## 2022-12-22 LAB — BASIC METABOLIC PANEL WITH GFR
Anion gap: 6 (ref 5–15)
BUN: 25 mg/dL — ABNORMAL HIGH (ref 8–23)
CO2: 20 mmol/L — ABNORMAL LOW (ref 22–32)
Calcium: 8.6 mg/dL — ABNORMAL LOW (ref 8.9–10.3)
Chloride: 111 mmol/L (ref 98–111)
Creatinine, Ser: 2.77 mg/dL — ABNORMAL HIGH (ref 0.44–1.00)
GFR, Estimated: 19 mL/min — ABNORMAL LOW
Glucose, Bld: 78 mg/dL (ref 70–99)
Potassium: 4 mmol/L (ref 3.5–5.1)
Sodium: 137 mmol/L (ref 135–145)

## 2022-12-22 LAB — ECHOCARDIOGRAM COMPLETE
Area-P 1/2: 4.29 cm2
Height: 63 in
MV M vel: 4.06 m/s
MV Peak grad: 65.9 mmHg
S' Lateral: 2.6 cm
Weight: 2109.36 [oz_av]

## 2022-12-22 LAB — GLUCOSE, CAPILLARY
Glucose-Capillary: 75 mg/dL (ref 70–99)
Glucose-Capillary: 77 mg/dL (ref 70–99)
Glucose-Capillary: 86 mg/dL (ref 70–99)
Glucose-Capillary: 87 mg/dL (ref 70–99)

## 2022-12-22 LAB — CBC
HCT: 18 % — ABNORMAL LOW (ref 36.0–46.0)
Hemoglobin: 5.1 g/dL — CL (ref 12.0–15.0)
MCH: 20.8 pg — ABNORMAL LOW (ref 26.0–34.0)
MCHC: 28.3 g/dL — ABNORMAL LOW (ref 30.0–36.0)
MCV: 73.5 fL — ABNORMAL LOW (ref 80.0–100.0)
Platelets: 281 10*3/uL (ref 150–400)
RBC: 2.45 MIL/uL — ABNORMAL LOW (ref 3.87–5.11)
RDW: 18.7 % — ABNORMAL HIGH (ref 11.5–15.5)
WBC: 6.3 10*3/uL (ref 4.0–10.5)
nRBC: 0 % (ref 0.0–0.2)

## 2022-12-22 LAB — HEMOGLOBIN AND HEMATOCRIT, BLOOD
HCT: 26.9 % — ABNORMAL LOW (ref 36.0–46.0)
Hemoglobin: 8.1 g/dL — ABNORMAL LOW (ref 12.0–15.0)

## 2022-12-22 LAB — HIV ANTIBODY (ROUTINE TESTING W REFLEX): HIV Screen 4th Generation wRfx: NONREACTIVE

## 2022-12-22 MED ORDER — SODIUM CHLORIDE 0.9 % IV BOLUS
500.0000 mL | Freq: Once | INTRAVENOUS | Status: AC
  Administered 2022-12-22: 500 mL via INTRAVENOUS

## 2022-12-22 MED ORDER — LINACLOTIDE 145 MCG PO CAPS
290.0000 ug | ORAL_CAPSULE | Freq: Every day | ORAL | Status: DC
  Administered 2022-12-22 – 2022-12-24 (×3): 290 ug via ORAL
  Filled 2022-12-22 (×4): qty 2

## 2022-12-22 MED ORDER — FOLIC ACID 1 MG PO TABS
1.0000 mg | ORAL_TABLET | Freq: Every day | ORAL | Status: DC
  Administered 2022-12-22 – 2022-12-24 (×2): 1 mg via ORAL
  Filled 2022-12-22 (×3): qty 1

## 2022-12-22 MED ORDER — BISACODYL 10 MG RE SUPP
10.0000 mg | Freq: Once | RECTAL | Status: AC
  Administered 2022-12-22: 10 mg via RECTAL
  Filled 2022-12-22: qty 1

## 2022-12-22 NOTE — Progress Notes (Signed)
Blood pressure 98/50 manually. MD notified.

## 2022-12-22 NOTE — Progress Notes (Signed)
New order received for MD. 550ml NS bolus given. Plan of care ongoing.

## 2022-12-22 NOTE — Consult Note (Addendum)
@LOGO @   Referring Provider: [facility] hospitalist Primary Care Physician:  Patient, [name] Per Primary Gastroenterologist:  Dr. Jenetta Downer, previously unassigned  Date of Admission: 12/21/2022 Date of Consultation: 12/22/2022  Reason for Consultation: Symptomatic acute on chronic iron deficiency anemia  HPI:  Meghan Terry is a 62 y.o. 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  2 units PRBCs ordered, admitted to the hospital for further evaluation and management, and GI consulted.  Additional labs in the emergency room showed ferritin 5, iron 13, saturation 4%, folate 5.5, B12 277.  Sodium 132, potassium 3.2, creatinine 2.9.  She also had CT chest abdomen pelvis without contrast in the emergency room with no acute findings.  Mild pulmonary fibrosis and pattern with apical basal gradient featuring irregular peripheral interstitial opacity with groundglass, some evidence of subpleural sparing.  Also with moderate burden of stool and stool balls throughout the colon and rectum.  CT cervical spine without contrast with no acute findings.  Consult:  Dizzy spells for the last couple of weeks and fell yesterday and came to the hospital. Had been falling a couple times a day. Had some chest pain off and on and shortness of breath. SOB has improved.   No brbpr or melena. Has constipation. Last BM was Monday a week ago. This is chronic. Takes Linzess 145 mcg as needed. Will take ever 3 days or so. Helps some. Took Thursday, but no BM. Feels like she needs to go, but can't. No abdominal pain.  Has nausea/vomiting  intermittently. Occurs with dizziness. Not associated with meals. Doesn't have a great appetite. Food doesn't taste good.Started a couple weeks ago. Feels like something burt the inside of her mouth. No heartburn. Last couple days hasn't been able to swallow pills, only able to take it in applesauce or baby bananas or pills will get stuck and she has to regurgitate. No trouble with foods or liquids.   Granddaughter who is 5 eats more than she does.   Started Trulicity 1.5 month ago and started losing weight. Lost about 30 lbs.     NSAID: None aside from 81 mg aspirin.  Last colonoscopy: Last year in [place]. Can't remember where, but across the street from hospital. No polyps.  Last EGD: Never.   Last dose of Plavix: Sunday 3/24  Past Medical History:  Diagnosis Date   Acute diastolic CHF (congestive heart failure) ([place]) 06/08/2021   Acute pulmonary edema ([facility])    Anemia due to stage 4 chronic kidney disease ([place]) 07/20/2022   Angina pectoris ([place]) 11/17/2019   Anxiety    Atypical chest pain 12/30/2020   Benign hypertension with CKD (chronic kidney disease) stage IV ([place]) 07/20/2022   CHF (congestive heart failure) ([place]) 06/08/2021   CKD (chronic kidney disease) stage 3b, GFR 30-59 ml/min 12/03/2019   Coronary artery disease    Coronary artery disease involving native coronary artery of native heart with angina pectoris ([place]) 10/03/2019   Depression 04/25/2017   Diabetes mellitus due to underlying condition with unspecified complications ([place]) [id number]   Diabetes mellitus with stage 4 chronic kidney disease GFR 15-29 ([place]) 07/20/2022   Essential hypertension 09/05/2019  Ex-smoker 09/05/2019   Family history of coronary artery disease 09/05/2019   History of depression 04/15/2020   Hx of diabetes mellitus 04/15/2020   Hyperlipidemia with target LDL less than 70 11/24/2019   Hyperphosphatemia 07/20/2022   Hypertension    Hypokalemia 04/30/2022   Hypothyroid    Migraine  04/25/2017   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   Syncope and collapse 04/29/2022   Thyroid disease    UTI (urinary tract infection) 04/30/2022    Past Surgical History:  Procedure Laterality Date   ABDOMINAL HYSTERECTOMY     APPENDECTOMY     CARDIAC CATHETERIZATION  11/22/2019   CHOLECYSTECTOMY     COLONOSCOPY     CORONARY ARTERY BYPASS GRAFT N/A 09/15/2019   Procedure: CORONARY ARTERY BYPASS GRAFTING (CABG) times three on pump using left internal mammary artery and right and left greater saphenous veins harvested endoscopically;  Surgeon: Gaye Pollack, MD;  Location: [facility];  Service: Open Heart Surgery;  Laterality: N/A;   CORONARY STENT INTERVENTION N/A 11/23/2019   Procedure: CORONARY STENT INTERVENTION;  Surgeon: [name], MD;  Location: [facility];  Service: Cardiovascular;  Laterality: N/A;   KNEE ARTHROSCOPY     LEFT HEART CATH AND CORONARY ANGIOGRAPHY N/A 09/14/2019   Procedure: LEFT HEART CATH AND CORONARY ANGIOGRAPHY;  Surgeon: [name], MD;  Location: [facility];  Service: Cardiovascular;  Laterality: N/A;   LEFT HEART CATH AND CORS/GRAFTS ANGIOGRAPHY N/A 11/22/2019   Procedure: LEFT HEART CATH AND CORS/GRAFTS ANGIOGRAPHY;  Surgeon: [name], MD;  Location: [facility];  Service: Cardiovascular;  Laterality: N/A;   LEFT HEART CATH AND CORS/GRAFTS ANGIOGRAPHY N/A 03/13/2020   Procedure: LEFT HEART CATH AND CORS/GRAFTS ANGIOGRAPHY;  Surgeon: Lorretta Harp, MD;  Location: [facility];  Service: Cardiovascular;  Laterality: N/A;   LEFT HEART CATH AND CORS/GRAFTS ANGIOGRAPHY N/A 12/31/2020   Procedure: LEFT HEART  CATH AND CORS/GRAFTS ANGIOGRAPHY;  Surgeon: Belva Crome, MD;  Location: [facility];  Service: Cardiovascular;  Laterality: N/A;   OSTEOCHONDRAL DEFECT REPAIR/RECONSTRUCTION Left 11/29/2014   Procedure: LEFT ANKLE MEDIAL MALLEOLUS OSTEOTOMY,AUTO GRAFT FROM CALCANEOUS;  OS TIBIA DENORO GRAFTING TALUS;  Surgeon: Wylene Simmer, MD;  Location: [facility];  Service: Orthopedics;  Laterality: Left;   TEE WITHOUT CARDIOVERSION N/A 09/15/2019   Procedure: TRANSESOPHAGEAL ECHOCARDIOGRAM (TEE);  Surgeon: Gaye Pollack, MD;  Location: [facility];  Service: Open Heart Surgery;  Laterality: N/A;   TUBAL LIGATION      Prior to Admission medications   Medication Sig Start Date End Date Taking? Authorizing Provider  acetaminophen (TYLENOL) 500 MG tablet Take 2 tablets by mouth 3 times daily as needed for pain 12/08/21  Yes   aspirin EC 81 MG tablet Take 1 tablet (81 mg total) by mouth daily. 09/05/19  Yes Revankar, Reita Cliche, MD  buPROPion (WELLBUTRIN XL) 300 MG 24 hr tablet Take 1 tablet (300 mg total) by mouth daily. 07/06/22  Yes   cholecalciferol (VITAMIN D3) 25 MCG (1000 UNIT) tablet Take 2 tablets (50 mcg total) by mouth daily. Patient taking differently: Take 1,000 Units by mouth  in the morning and at bedtime. 08/12/22  Yes   clonazePAM (KLONOPIN) 0.5 MG tablet Take 1 tablet (0.5 mg total) by mouth 3 (three) times daily. Patient taking differently: Take 0.5 mg by mouth at bedtime. 11/03/22  Yes   clopidogrel (PLAVIX) 75 MG tablet TAKE 1 TABLET BY MOUTH DAILY Patient taking differently: Take 75 mg by mouth daily. 03/30/22  Yes Revankar, Reita Cliche, MD  dapagliflozin propanediol (FARXIGA) 10 MG TABS tablet Take 1 tablet (10 mg total) by mouth daily before breakfast. 05/12/22  Yes Revankar, Reita Cliche, MD  Dulaglutide (TRULICITY) 1.5 0000000 SOPN Inject 1.5 mg into the skin once a week. 11/03/22  Yes   furosemide (LASIX) 40 MG tablet Take 1.5 tablets (60 mg total) by mouth 2 (two) times daily. Patient  taking differently: Take 60 mg by mouth daily. 07/06/22  Yes   levothyroxine (SYNTHROID) 50 MCG tablet TAKE 1 TABLET BY MOUTH ONCE DAILY ON AN EMPTY STOMACH 30 MINUTES BEFORE BREAKFAST ON SAT AND SUNDAY Patient taking differently: Take 50 mcg by mouth See admin instructions. Take 1 tablet in the morning with breakfast on SATURDAY AND SUNDAY. 05/28/22  Yes   levothyroxine (SYNTHROID) 75 MCG tablet Take 1 tablet (75 mcg total) by mouth daily Monday thru Friday. 06/08/22  Yes   metoprolol succinate (TOPROL-XL) 25 MG 24 hr tablet Take 1 tablet (25 mg total) by mouth at bedtime. Take with or immediately following a meal. 09/17/22  Yes Revankar, Reita Cliche, MD  nitroGLYCERIN (NITROSTAT) 0.4 MG SL tablet Place 1 tablet (0.4 mg total) under the tongue every 5 (five) minutes as needed for chest pain. 12/29/21  Yes Revankar, Reita Cliche, MD  ondansetron (ZOFRAN) 4 MG tablet take 1 tablet (4 mg) by mouth 2 times per day as needed for nausea Patient taking differently: Take 4 mg by mouth 2 (two) times daily as needed for nausea or vomiting. 05/12/22  Yes   Potassium Chloride ER 20 MEQ TBCR Take 1 tablet (20 mEq total) by mouth daily. 11/12/22  Yes   promethazine (PHENERGAN) 25 MG tablet take 1 tablet (25 mg) by mouth every 6 hours as needed for 30 days Patient taking differently: Take 25 mg by mouth every 6 (six) hours as needed for nausea or vomiting. 07/30/22  Yes   ranolazine (RANEXA) 500 MG 12 hr tablet Take 1 tablet by mouth 2 times daily 12/25/21  Yes Revankar, Reita Cliche, MD  rOPINIRole (REQUIP) 1 MG tablet Take 1 tablet (1 mg total) by mouth 3 (three) times daily. 11/12/22  Yes   rOPINIRole (REQUIP) 4 MG tablet Take 1 tablet (4 mg total) by mouth 1 to 3 hours before bedtime. 06/29/22  Yes   rosuvastatin (CRESTOR) 40 MG tablet Take 1 tablet (40 mg total) by mouth daily. Patient taking differently: Take 40 mg by mouth at bedtime. 07/27/22  Yes   topiramate (TOPAMAX) 100 MG tablet TAKE 1 TABLET BY MOUTH TWICE DAILY. 05/12/22   Yes   traMADol (ULTRAM) 50 MG tablet Take 1 tablet (50 mg total) by mouth every 6 (six) hours. Patient taking differently: Take 50 mg by mouth daily as needed for moderate pain. 11/03/22  Yes   traZODone (DESYREL) 150 MG tablet Take 1 tablet (150 mg total) by mouth in the morning and at bedtime. 11/16/22  Yes   traZODone (DESYREL) 50 MG tablet Take 1 tablet (50 mg total) by mouth daily. 11/03/22  Yes     Current Facility-Administered Medications  Medication Dose Route Frequency Provider Last  Rate Last Admin   0.9 %  sodium chloride infusion (Manually program via Guardrails IV Fluids)   Intravenous Once Godfrey Pick, MD       acetaminophen (TYLENOL) tablet 650 mg  650 mg Oral Q6H PRN Tat, Shanon Brow, MD       Or   acetaminophen (TYLENOL) suppository 650 mg  650 mg Rectal Q6H PRN Tat, Shanon Brow, MD       buPROPion (WELLBUTRIN XL) 24 hr tablet 300 mg  300 mg Oral Daily Tat, David, MD   300 mg at 12/21/22 2020   cholecalciferol (VITAMIN D3) 25 MCG (1000 UNIT) tablet 1,000 Units  1,000 Units Oral Daily Tat, David, MD   1,000 Units at 12/21/22 2020   clonazePAM (KLONOPIN) tablet 0.5 mg  0.5 mg Oral QHS Orson Eva, MD   0.5 mg at 12/21/22 2028   insulin aspart (novoLOG) injection 0-5 Units  0-5 Units Subcutaneous QHS Tat, Shanon Brow, MD       insulin aspart (novoLOG) injection 0-9 Units  0-9 Units Subcutaneous TID WC Tat, Shanon Brow, MD       levothyroxine (SYNTHROID) tablet 75 mcg  75 mcg Oral Daily Tat, David, MD   75 mcg at 12/22/22 0500   metoprolol succinate (TOPROL-XL) 24 hr tablet 25 mg  25 mg Oral QHS Tat, Shanon Brow, MD       ondansetron [facility]) tablet 4 mg  4 mg Oral Q6H PRN Tat, David, MD       Or   ondansetron [facility]) injection 4 mg  4 mg Intravenous Q6H PRN Tat, Shanon Brow, MD   4 mg at 12/21/22 1902   potassium chloride SA (KLOR-CON M) CR tablet 20 mEq  20 mEq Oral Daily Tat, David, MD   20 mEq at 12/21/22 2020   ranolazine (RANEXA) 12 hr tablet 500 mg  500 mg Oral BID Tat, Shanon Brow, MD   500 mg at 12/21/22 2027    rOPINIRole (REQUIP) tablet 1 mg  1 mg Oral TID WC Tat, Shanon Brow, MD       rOPINIRole (REQUIP) tablet 4 mg  4 mg Oral Benay Pike, MD   4 mg at 12/21/22 2027   rosuvastatin (CRESTOR) tablet 40 mg  40 mg Oral Daily Tat, Shanon Brow, MD   40 mg at 12/21/22 1901   topiramate (TOPAMAX) tablet 100 mg  100 mg Oral BID Orson Eva, MD   100 mg at 12/21/22 2028   traMADol (ULTRAM) tablet 50 mg  50 mg Oral Q12H PRN Orson Eva, MD   50 mg at 12/21/22 1901   traZODone (DESYREL) tablet 150 mg  150 mg Oral Benay Pike, MD   150 mg at 12/21/22 2028    Allergies as of 12/21/2022 - Review Complete 12/21/2022  Allergen Reaction Noted   Abilify [aripiprazole]  08/10/2022   Ciprofloxacin Nausea And Vomiting 08/10/2022   Carbidopa-levodopa Other (See Comments) 05/14/2017   Prednisone Other (See Comments) 09/07/2019   Venlafaxine Other (See Comments) 05/14/2017    Family History  Problem Relation Age of Onset   Asthma Mother    COPD Mother    Diabetes Mother    Macular degeneration Mother    Congestive Heart Failure Father     Social History   Socioeconomic History   Marital status: Married    Spouse name: Shadiamon Osmon   Number of children: 2   Years of education: Not on file   Highest education level: Associate degree: occupational, Hotel manager, or vocational program  Occupational History   Occupation: disabilty  Comment: EMT/CNA @ [employer]  Tobacco Use   Smoking status: Former    Packs/day: 1.00    Years: 47.00    Additional pack years: 0.00    Total pack years: 47.00    Types: Cigarettes    Quit date: 09/15/2019    Years since quitting: 3.2    Passive exposure: Past   Smokeless tobacco: Never  Vaping Use   Vaping Use: Every day   Start date: 09/15/2019   Substances: Nicotine  Substance and Sexual Activity   Alcohol use: No   Drug use: No   Sexual activity: Not on file  Other Topics Concern   Not on file  Social History Narrative   Not on file   Social Determinants of  Health   Financial Resource Strain: High Risk (06/19/2021)   Overall Financial Resource Strain (CARDIA)    Difficulty of Paying Living Expenses: Very hard  Food Insecurity: No Food Insecurity (12/21/2022)   Hunger Vital Sign    Worried About Running Out of Food in the Last Year: Never true    Ran Out of Food in the Last Year: Never true  Transportation Needs: No Transportation Needs (12/21/2022)   PRAPARE - Hydrologist (Medical): No    Lack of Transportation (Non-Medical): No  Physical Activity: Not on file  Stress: Not on file  Social Connections: Not on file  Intimate Partner Violence: Not At Risk (12/21/2022)   Humiliation, Afraid, Rape, and Kick questionnaire    Fear of Current or Ex-Partner: No    Emotionally Abused: No    Physically Abused: No    Sexually Abused: No    Review of Systems: Gen: Denies fever, chills, cold or flu like symptoms, pre-syncope, or syncope.  CV: See HPI Resp: See HPI GI: See HPI GU : Denies urinary burning, urinary frequency, urinary incontinence.  MS: Denies joint pain. Derm: Denies rash. Psych: Denies depression, anxiety. Heme: See HPI  Physical Exam: Vital signs in last 24 hours: Temp:  [97.8 F (36.6 C)-98.3 F (36.8 C)] 97.8 F (36.6 C) (03/26 0635) Pulse Rate:  [83-90] 87 (03/26 0635) Resp:  [16-20] 17 (03/26 0635) BP: (89-131)/(50-97) 114/58 (03/26 0635) SpO2:  [98 %-100 %] 100 % (03/26 [id number]) Weight:  [59.8 kg-59.9 kg] 59.8 kg (03/25 1648) Last BM Date : 12/16/22 (per patient) General:   Alert,  Well-developed, well-nourished, pleasant and cooperative in NAD Head:  Normocephalic and atraumatic. Eyes:  Sclera clear, no icterus.   Conjunctiva pink. Ears:  Normal auditory acuity. Nose:  No deformity. Mouth:  No deformity or lesions. Atrophic glossitis.  Neck:  Supple; no masses or thyromegaly. Lungs:  Clear throughout to auscultation.   No wheezes, crackles, or rhonchi. No acute distress. Heart:   Regular rate and rhythm; no murmurs, clicks, rubs,  or gallops. Abdomen:  Soft, nontender and nondistended. No masses, hepatosplenomegaly or hernias noted. Normal bowel sounds, without guarding, and without rebound.   Rectal:  Deferred  Msk:  Symmetrical without gross deformities. Normal posture. Extremities:  Without edema. Neurologic:  Alert and  oriented x4;  grossly normal neurologically. Skin:  Intact without significant lesions or rashes. Psych: Normal mood and affect.  Intake/Output from previous day: 03/25 0701 - 03/26 0700 In: 1850 [P.O.:600; I.V.:750; IV Piggyback:500] Out: -  Intake/Output this shift: No intake/output data recorded.  Lab Results: Recent Labs    12/21/22 1313 12/22/22 0417  WBC 7.0 6.3  HGB 5.5* 5.1*  HCT 18.6* 18.0*  PLT  328 281   BMET Recent Labs    12/21/22 1313 12/22/22 0417  NA 132* 137  K 3.2* 4.0  CL 106 111  CO2 19* 20*  GLUCOSE 109* 78  BUN 29* 25*  CREATININE 2.90* 2.77*  CALCIUM 8.5* 8.6*   LFT Recent Labs    12/21/22 1313  PROT 6.0*  ALBUMIN 3.6  AST 15  ALT 14  ALKPHOS 67  BILITOT 0.4    Studies/Results: CT Head Wo Contrast  Result Date: 12/21/2022 CLINICAL DATA:  Syncope, multiple falls EXAM: CT HEAD WITHOUT CONTRAST CT CERVICAL SPINE WITHOUT CONTRAST TECHNIQUE: Multidetector CT imaging of the head and cervical spine was performed following the standard protocol without intravenous contrast. Multiplanar CT image reconstructions of the cervical spine were also generated. RADIATION DOSE REDUCTION: This exam was performed according to the departmental dose-optimization program which includes automated exposure control, adjustment of the mA and/or kV according to patient size and/or use of iterative reconstruction technique. COMPARISON:  04/29/2022 FINDINGS: CT HEAD FINDINGS Brain: No evidence of acute infarction, hemorrhage, hydrocephalus, extra-axial collection or mass lesion/mass effect. Mild periventricular white matter  hypodensity. Vascular: No hyperdense vessel or unexpected calcification. Skull: Normal. Negative for fracture or focal lesion. Sinuses/Orbits: No acute finding. Other: None. CT CERVICAL SPINE FINDINGS Alignment: Normal. Skull base and vertebrae: No acute fracture. No primary bone lesion or focal pathologic process. Soft tissues and spinal canal: No prevertebral fluid or swelling. No visible canal hematoma. Disc levels: Generally mild multilevel disc space height loss and osteophytosis, focally moderate at C5-C6. Upper chest: Negative. Other: None. IMPRESSION: 1. No acute intracranial pathology. Small-vessel white matter disease. 2. No fracture or static subluxation of the cervical spine. 3. Generally mild multilevel disc space height loss and osteophytosis, focally moderate at C5-C6. Electronically Signed   By: Delanna Ahmadi M.D.   On: 12/21/2022 14:28   CT Cervical Spine Wo Contrast  Result Date: 12/21/2022 CLINICAL DATA:  Syncope, multiple falls EXAM: CT HEAD WITHOUT CONTRAST CT CERVICAL SPINE WITHOUT CONTRAST TECHNIQUE: Multidetector CT imaging of the head and cervical spine was performed following the standard protocol without intravenous contrast. Multiplanar CT image reconstructions of the cervical spine were also generated. RADIATION DOSE REDUCTION: This exam was performed according to the departmental dose-optimization program which includes automated exposure control, adjustment of the mA and/or kV according to patient size and/or use of iterative reconstruction technique. COMPARISON:  04/29/2022 FINDINGS: CT HEAD FINDINGS Brain: No evidence of acute infarction, hemorrhage, hydrocephalus, extra-axial collection or mass lesion/mass effect. Mild periventricular white matter hypodensity. Vascular: No hyperdense vessel or unexpected calcification. Skull: Normal. Negative for fracture or focal lesion. Sinuses/Orbits: No acute finding. Other: None. CT CERVICAL SPINE FINDINGS Alignment: Normal. Skull base and  vertebrae: No acute fracture. No primary bone lesion or focal pathologic process. Soft tissues and spinal canal: No prevertebral fluid or swelling. No visible canal hematoma. Disc levels: Generally mild multilevel disc space height loss and osteophytosis, focally moderate at C5-C6. Upper chest: Negative. Other: None. IMPRESSION: 1. No acute intracranial pathology. Small-vessel white matter disease. 2. No fracture or static subluxation of the cervical spine. 3. Generally mild multilevel disc space height loss and osteophytosis, focally moderate at C5-C6. Electronically Signed   By: Delanna Ahmadi M.D.   On: 12/21/2022 14:28   CT CHEST ABDOMEN PELVIS WO CONTRAST  Result Date: 12/21/2022 CLINICAL DATA:  Multiple falls, chest pain EXAM: CT CHEST, ABDOMEN AND PELVIS WITHOUT CONTRAST TECHNIQUE: Multidetector CT imaging of the chest, abdomen and pelvis was performed following the  standard protocol without IV contrast. RADIATION DOSE REDUCTION: This exam was performed according to the departmental dose-optimization program which includes automated exposure control, adjustment of the mA and/or kV according to patient size and/or use of iterative reconstruction technique. COMPARISON:  CT chest, 06/16/2022 FINDINGS: CT CHEST FINDINGS Cardiovascular: Aortic atherosclerosis. Normal heart size. Extensive three-vessel coronary artery calcifications and or stents status post median sternotomy and CABG. Mediastinum/Nodes: No enlarged mediastinal, hilar, or axillary lymph nodes. Thyroid gland, trachea, and esophagus demonstrate no significant findings. Lungs/Pleura: Mild pulmonary fibrosis in a pattern with apical to basal gradient featuring irregular peripheral interstitial opacity and ground-glass, with some evidence of subpleural sparing. No pleural effusion or pneumothorax. Musculoskeletal: No chest wall abnormality. No acute osseous findings. CT ABDOMEN PELVIS FINDINGS Hepatobiliary: No focal liver abnormality is seen. Status  post cholecystectomy. No biliary dilatation. Pancreas: Unremarkable. No pancreatic ductal dilatation or surrounding inflammatory changes. Spleen: Normal in size without significant abnormality. Adrenals/Urinary Tract: Adrenal glands are unremarkable. Kidneys are normal, without renal calculi, solid lesion, or hydronephrosis. Bladder is unremarkable. Stomach/Bowel: Stomach is within normal limits. Appendix is not clearly visualized. No evidence of bowel wall thickening, distention, or inflammatory changes. Moderate burden of stool and stool balls throughout the colon and rectum. Vascular/Lymphatic: Aortic atherosclerosis. No enlarged abdominal or pelvic lymph nodes. Reproductive: Status post hysterectomy. Other: No abdominal wall hernia or abnormality. No ascites. Musculoskeletal: No acute osseous findings. IMPRESSION: 1. No noncontrast CT evidence of acute traumatic injury to the chest, abdomen, or pelvis. 2. Mild pulmonary fibrosis in a pattern with apical to basal gradient featuring irregular peripheral interstitial opacity and ground-glass, with some evidence of subpleural sparing. 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 3. Coronary artery disease. 4. Status post cholecystectomy, hysterectomy, and CABG. 5. Moderate burden of stool and stool balls throughout the colon and rectum. Aortic Atherosclerosis (ICD10-I70.0). Electronically Signed   By: Delanna Ahmadi M.D.   On: 12/21/2022 14:25   DG Chest Portable 1 View  Result Date: 12/21/2022 CLINICAL DATA:  Dizziness with several falls and central chest pain EXAM: PORTABLE CHEST 1 VIEW COMPARISON:  Chest radiograph dated 05/02/2022 FINDINGS: Normal lung volumes. Similar peripheral reticulations. No pleural effusion or pneumothorax. Similar postsurgical cardiomediastinal silhouette.  Median sternotomy wires are nondisplaced. No radiographic finding of acute displaced fracture. Right upper quadrant surgical clips. IMPRESSION: 1. No active disease. 2.  No radiographic finding of acute displaced fracture. Electronically Signed   By: Darrin Nipper M.D.   On: 12/21/2022 13:38    Impression:  62 y.o. 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  She is admitted for further evaluation and GI consulted for assistance.  Acute on chronic symptomatic anemia: Recent baseline hemoglobin in the 9-10 range.  Hemoglobin 5.5 on admission, 5.1 this morning, in process of receiving 2 units PRBCs today.  FOBT negative.  She has evidence of iron, folate deficiency.  B12 low normal at 277.  Denies overt GI bleeding.  Admits to decreased appetite, change  in taste, intermittent nausea/vomiting occurring with dizziness and not associated with meals.  She has noted some new onset pill dysphagia in the last couple of days.  No food or liquid dysphagia.  She has had significant unintentional weight loss since December, total of 29 pounds.  States weight loss started after starting Trulicity about 1.5 months ago.  Reports colonoscopy within the last year in [place] without polyps. No prior EGD. Last dose of Plavix Sunday, 3/24. No NSAIDs aside from a 1 mg aspirin.  CT A/P without contrast this admission with no findings.  Etiology of IDA is not clear. Differentials include PUD, AVMs, malignancy, polyps. Recommend starting with EGD tomorrow. Will add on possible dilation as appropriate. Consider  colonoscopy if EGD is unrevealing. Will also empirically start PPI BID for now. Can discontinue if EGD is unrevealing.   Chronic constipation: Not adequately managed.  Taking Linzess 145 mcg as needed outpatient with inadequate results.  CT A/P this admission with moderate burden of stool and stool balls throughout the colon and rectum.  We will start her on Linzess 290 mcg daily and give suppository today.   Plan:  EGD +/- dilation with propofol with Dr. Gala Romney tomorrow.  The risks, benefits, and alternatives have been discussed with the patient in detail. The patient states understanding and desires to proceed.  Continue to hold Plavix and aspirin for now. Last dose of Plavix 3/24.  NPO at midnight.  Agree with transfusing 2 units PRBCs.  Continue to monitor H/H and transfuse as needed.  Start folic acid 1 mg daily. Recommend B12 supplementation as well.  She will need iron supplementation. Could receive IV iron while inpatient.  Linzess 290 mcg daily.  Dulcolax suppository 10 mg today.  Zofran PRN.    LOS: 0 days    12/22/2022, 8:02 AM   Aliene Altes, [facility]

## 2022-12-22 NOTE — Progress Notes (Signed)
  Echocardiogram 2D Echocardiogram has been performed.  Meghan Terry 12/22/2022, 9:39 AM

## 2022-12-22 NOTE — TOC Progression Note (Signed)
  Transition of Care [facility]) Screening Note   Patient Details  Name: Meghan Terry Date of Birth: 27-Sep-1961   Transition of Care [facility]) CM/SW Contact:    Boneta Lucks, RN Phone Number: 12/22/2022, 11:28 AM  IN OBS, TOC following.  Transition of Care Department [facility]) has reviewed patient and no TOC needs have been identified at this time. We will continue to monitor patient advancement through interdisciplinary progression rounds. If new patient transition needs arise, please place a TOC consult.     Expected Discharge Plan: Home/Self Care Barriers to Discharge: Continued Medical Work up  Expected Discharge Plan and Services       Living arrangements for the past 2 months: Single Family Home                    Social Determinants of Health (SDOH) Interventions SDOH Screenings   Food Insecurity: No Food Insecurity (12/21/2022)  Housing: Low Risk  (12/21/2022)  Transportation Needs: No Transportation Needs (12/21/2022)  Utilities: Not At Risk (12/21/2022)  Alcohol Screen: Low Risk  (06/10/2021)  Financial Resource Strain: High Risk (06/19/2021)  Tobacco Use: Medium Risk (12/21/2022)

## 2022-12-22 NOTE — Care Management Obs Status (Signed)
[facility] NOTIFICATION   Patient Details  Name: Meghan Terry MRN: [id number] Date of Birth: 1961-08-30   Medicare Observation Status Notification Given:  Yes    Tommy Medal 12/22/2022, 2:52 PM

## 2022-12-22 NOTE — Progress Notes (Signed)
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  The patient states that every time she stands up from a recumbent position she has dizziness and has near syncopal episodes.  She does have some chest discomfort and increasing dyspnea on exertion.  She states that her shortness of breath has gradually worsened with minimal exertion.  She denies any fevers, chills, cough, hemoptysis, nausea, vomiting, diarrhea, hematochezia, melena.  There is no hematuria.  The patient states that she did have a few episodes of epistaxis on 12/19/2022.  However, she has had episodes of presyncope and dizziness even prior to that.  She denies any new medications.  She states she had a colonoscopy about 2 years ago in [facility].  Apparently was normal.  She continues on aspirin and Plavix for coronary artery disease. In the ED, the patient was afebrile and hemodynamically stable with oxygen saturation 100% room air.  WBC 7.0, hemoglobin 5.5, platelets 320,000.  Sodium 132, potassium 3.2, bicarbonate 19, serum creatinine 2.90.  LFTs were unremarkable.  CT brain was negative for any acute findings.  CT cervical spine was negative for fracture or subluxation.  Chest x-ray was negative for any infiltrates or edema.  2 units PRBC were ordered.  FOBT was negative.   Assessment/Plan: Symptomatic anemia -Check iron studies -Transfused 2 units PRBC --iron saturation 4, ferritin 5 -GI consult appreciated>>EGD 3/27 plans noted -Baseline hemoglobin  ~9 -Presented with hemoglobin 5.5 -folate 5.5>>replete -B12--277   CKD stage IV -Baseline creatinine 2.8-3.1 -Monitor BMP  Pyuria -UA>50 WBC -urine culture was not sent despite this>>will reorder   Coronary artery disease -Status post CABG times 11/2018 -troponin 5>>5 -Holding aspirin and Plavix temporarily -Continue metoprolol succinate   Chronic diastolic CHF -Clinically euvolemic -04/30/2022 echo EF 60-65%, no WMA, trivial MR, mild decrease RVF -Holding furosemide temporarily   Microcytic anemia/Iron deficiency -iron saturation 4, ferritin 5   Hyperlipidemia -Continue Crestor   Anxiety/depression -Continue home dose of Klonopin and Wellbutrin   Essential hypertension -Continue metoprolol succinate   Diabetes mellitus type 2, controlled -04/30/2022 hemoglobin A1c 7.0 -Repeat hemoglobin 123456 -Holding Trulicity and Farxiga temporarily -NovoLog sliding scale   Hypothyroidism -Continue Synthroid   Restless leg syndrome -Continue Requip     Family Communication:  no Family at bedside  Consultants:  GI  Code Status:  FULL  DVT Prophylaxis:  SCDs   Procedures: As Listed in Progress Note Above  Antibiotics: None        Subjective:  Patient denies fevers, chills, headache, chest pain, dyspnea, nausea, vomiting, diarrhea, abdominal pain, dysuria, hematuria, hematochezia, and melena.  Objective: Vitals:   12/22/22 0841 12/22/22 0856 12/22/22 1117 12/22/22 1344  BP: (!) 93/39 (!) 104/51 (!) 108/54 (!) 118/59  Pulse: 80 81 79 88  Resp: 20 15 20 18   Temp: 97.9 F (36.6 C) 98 F (36.7 C) 98 F (36.7 C) 97.8 F (36.6 C)  TempSrc: Oral Oral Oral Oral  SpO2: 100% 100% 100% 100%  Weight:      Height:  Intake/Output Summary (Last 24 hours) at 12/22/2022 1649 Last data filed at 12/22/2022 1157 Gross per 24 hour  Intake 2410 ml  Output --  Net 2410 ml   Weight change:  Exam:  General:  Pt is alert, follows commands appropriately, not in  acute distress HEENT: No icterus, No thrush, No neck mass, [place]/AT Cardiovascular: RRR, S1/S2, no rubs, no gallops Respiratory: CTA bilaterally, no wheezing, no crackles, no rhonchi Abdomen: Soft/+BS, non tender, non distended, no guarding Extremities: No edema, No lymphangitis, No petechiae, No rashes, no synovitis   Data Reviewed: I have personally reviewed following labs and imaging studies Basic Metabolic Panel: Recent Labs  Lab 12/21/22 1313 12/22/22 0417  NA 132* 137  K 3.2* 4.0  CL 106 111  CO2 19* 20*  GLUCOSE 109* 78  BUN 29* 25*  CREATININE 2.90* 2.77*  CALCIUM 8.5* 8.6*  MG 2.3  --    Liver Function Tests: Recent Labs  Lab 12/21/22 1313  AST 15  ALT 14  ALKPHOS 67  BILITOT 0.4  PROT 6.0*  ALBUMIN 3.6   Recent Labs  Lab 12/21/22 1313  LIPASE 37   No results for input(s): "AMMONIA" in the last 168 hours. Coagulation Profile: No results for input(s): "INR", "PROTIME" in the last 168 hours. CBC: Recent Labs  Lab 12/21/22 1313 12/22/22 0417 12/22/22 1300  WBC 7.0 6.3  --   HGB 5.5* 5.1* 8.1*  HCT 18.6* 18.0* 26.9*  MCV 70.7* 73.5*  --   PLT 328 281  --    Cardiac Enzymes: No results for input(s): "CKTOTAL", "CKMB", "CKMBINDEX", "TROPONINI" in the last 168 hours. BNP: Invalid input(s): "POCBNP" CBG: Recent Labs  Lab 12/21/22 2019 12/22/22 0730 12/22/22 1120 12/22/22 1636  GLUCAP 74 75 77 86   HbA1C: No results for input(s): "HGBA1C" in the last 72 hours. Urine analysis:    Component Value Date/Time   COLORURINE YELLOW 12/21/2022 1740   APPEARANCEUR CLOUDY (A) 12/21/2022 1740   LABSPEC 1.005 12/21/2022 1740   PHURINE 6.0 12/21/2022 1740   GLUCOSEU >=500 (A) 12/21/2022 1740   HGBUR MODERATE (A) 12/21/2022 1740   BILIRUBINUR NEGATIVE 12/21/2022 1740   KETONESUR NEGATIVE 12/21/2022 1740   PROTEINUR 30 (A) 12/21/2022 1740   NITRITE POSITIVE (A) 12/21/2022 1740   LEUKOCYTESUR LARGE (A) 12/21/2022 1740   Sepsis  Labs: @LABRCNTIP (procalcitonin:4,lacticidven:4) )No results found for this or any previous visit (from the past 240 hour(s)).   Scheduled Meds:  sodium chloride   Intravenous Once   buPROPion  300 mg Oral Daily   cholecalciferol  1,000 Units Oral Daily   clonazePAM  0.5 mg Oral QHS   folic acid  1 mg Oral Daily   insulin aspart  0-5 Units Subcutaneous QHS   insulin aspart  0-9 Units Subcutaneous TID WC   levothyroxine  75 mcg Oral Daily   linaclotide  290 mcg Oral QAC breakfast   metoprolol succinate  25 mg Oral QHS   potassium chloride SA  20 mEq Oral Daily   ranolazine  500 mg Oral BID   rOPINIRole  1 mg Oral TID WC   rOPINIRole  4 mg Oral QHS   rosuvastatin  40 mg Oral Daily   topiramate  100 mg Oral BID   traZODone  150 mg Oral QHS   Continuous Infusions:  Procedures/Studies: ECHOCARDIOGRAM COMPLETE  Result Date: 12/22/2022    ECHOCARDIOGRAM REPORT   Patient Name:   [name] Date of Exam: 12/22/2022 Medical Rec #:  [id number]  Height:       63.0 in Accession #:    [id number]   Weight:       131.8 lb Date of Birth:  [birth year]     BSA:          1.620 m Patient Age:    43 years     BP:           93/39 mmHg Patient Gender: F            HR:           81 bpm. Exam Location:  [facility] Procedure: 2D Echo, Cardiac Doppler and Color Doppler Indications:    Chest Pain R07.9  History:        Patient has prior history of Echocardiogram examinations, most                 recent 04/30/2022. CHF, CAD, Prior CABG, Signs/Symptoms:Chest                 Pain; Risk Factors:Former Smoker, Hypertension and Diabetes.  Sonographer:    Greer Pickerel Referring Phys: [phone] Jamie Belger  Sonographer Comments: Image acquisition challenging due to patient body habitus and Image acquisition challenging due to respiratory motion. IMPRESSIONS  1. Left ventricular ejection fraction, by estimation, is 60 to 65%. The left ventricle has normal function. The left ventricle has no regional wall motion abnormalities. Left  ventricular diastolic parameters are consistent with Grade II diastolic dysfunction (pseudonormalization).  2. Right ventricular systolic function is normal. The right ventricular size is normal. There is normal pulmonary artery systolic pressure. The estimated right ventricular systolic pressure is Q000111Q mmHg.  3. Left atrial size was mildly dilated.  4. The mitral valve is grossly normal. Mild mitral valve regurgitation.  5. The aortic valve is tricuspid. There is mild calcification of the aortic valve. Aortic valve regurgitation is not visualized. Aortic valve sclerosis is present, with no evidence of aortic valve stenosis.  6. The inferior vena cava is normal in size with greater than 50% respiratory variability, suggesting right atrial pressure of 3 mmHg. Comparison(s): Prior images reviewed side by side. LVEF remains normal range at 60-65%. RV contraction is normal. FINDINGS  Left Ventricle: Left ventricular ejection fraction, by estimation, is 60 to 65%. The left ventricle has normal function. The left ventricle has no regional wall motion abnormalities. The left ventricular internal cavity size was normal in size. There is  no left ventricular hypertrophy. Left ventricular diastolic parameters are consistent with Grade II diastolic dysfunction (pseudonormalization). Right Ventricle: The right ventricular size is normal. No increase in right ventricular wall thickness. Right ventricular systolic function is normal. There is normal pulmonary artery systolic pressure. The tricuspid regurgitant velocity is 2.00 m/s, and  with an assumed right atrial pressure of 3 mmHg, the estimated right ventricular systolic pressure is Q000111Q mmHg. Left Atrium: Left atrial size was mildly dilated. Right Atrium: Right atrial size was normal in size. Pericardium: There is no evidence of pericardial effusion. Mitral Valve: The mitral valve is grossly normal. Mild mitral valve regurgitation. Tricuspid Valve: The tricuspid valve is  grossly normal. Tricuspid valve regurgitation is trivial. Aortic Valve: The aortic valve is tricuspid. There is mild calcification of the aortic valve. Aortic valve regurgitation is not visualized. Aortic valve sclerosis is present, with no evidence of aortic valve stenosis. Pulmonic Valve: The pulmonic valve was grossly normal. Pulmonic valve regurgitation is not visualized. Aorta: The aortic root is normal in size and structure. Venous: The inferior  vena cava is normal in size with greater than 50% respiratory variability, suggesting right atrial pressure of 3 mmHg. IAS/Shunts: No atrial level shunt detected by color flow Doppler.  LEFT VENTRICLE PLAX 2D LVIDd:         4.00 cm   Diastology LVIDs:         2.60 cm   LV e' medial:    5.66 cm/s LV PW:         0.90 cm   LV E/e' medial:  18.2 LV IVS:        0.80 cm   LV e' lateral:   8.59 cm/s LVOT diam:     1.90 cm   LV E/e' lateral: 12.0 LV SV:         56 LV SV Index:   35 LVOT Area:     2.84 cm  RIGHT VENTRICLE RV S prime:     7.42 cm/s TAPSE (M-mode): 1.1 cm LEFT ATRIUM             Index        RIGHT ATRIUM           Index LA diam:        3.30 cm 2.04 cm/m   RA Area:     14.90 cm LA Vol (A2C):   53.9 ml 33.28 ml/m  RA Volume:   36.10 ml  22.29 ml/m LA Vol (A4C):   64.0 ml 39.51 ml/m LA Biplane Vol: 60.6 ml 37.41 ml/m  AORTIC VALVE LVOT Vmax:   85.30 cm/s LVOT Vmean:  58.500 cm/s LVOT VTI:    0.198 m  AORTA Ao Root diam: 3.40 cm Ao Asc diam:  3.10 cm MITRAL VALVE                TRICUSPID VALVE MV Area (PHT): 4.29 cm     TR Peak grad:   16.0 mmHg MV Decel Time: 177 msec     TR Vmax:        200.00 cm/s MR Peak grad: 65.9 mmHg MR Vmax:      406.00 cm/s   SHUNTS MV E velocity: 103.00 cm/s  Systemic VTI:  0.20 m MV A velocity: 83.70 cm/s   Systemic Diam: 1.90 cm MV E/A ratio:  1.23 Rozann Lesches MD Electronically signed by Rozann Lesches MD Signature Date/Time: 12/22/2022/10:56:29 AM    Final    CT Head Wo Contrast  Result Date: 12/21/2022 CLINICAL DATA:   Syncope, multiple falls EXAM: CT HEAD WITHOUT CONTRAST CT CERVICAL SPINE WITHOUT CONTRAST TECHNIQUE: Multidetector CT imaging of the head and cervical spine was performed following the standard protocol without intravenous contrast. Multiplanar CT image reconstructions of the cervical spine were also generated. RADIATION DOSE REDUCTION: This exam was performed according to the departmental dose-optimization program which includes automated exposure control, adjustment of the mA and/or kV according to patient size and/or use of iterative reconstruction technique. COMPARISON:  04/29/2022 FINDINGS: CT HEAD FINDINGS Brain: No evidence of acute infarction, hemorrhage, hydrocephalus, extra-axial collection or mass lesion/mass effect. Mild periventricular white matter hypodensity. Vascular: No hyperdense vessel or unexpected calcification. Skull: Normal. Negative for fracture or focal lesion. Sinuses/Orbits: No acute finding. Other: None. CT CERVICAL SPINE FINDINGS Alignment: Normal. Skull base and vertebrae: No acute fracture. No primary bone lesion or focal pathologic process. Soft tissues and spinal canal: No prevertebral fluid or swelling. No visible canal hematoma. Disc levels: Generally mild multilevel disc space height loss and osteophytosis, focally moderate at C5-C6. Upper chest: Negative. Other: None.  IMPRESSION: 1. No acute intracranial pathology. Small-vessel white matter disease. 2. No fracture or static subluxation of the cervical spine. 3. Generally mild multilevel disc space height loss and osteophytosis, focally moderate at C5-C6. Electronically Signed   By: Delanna Ahmadi M.D.   On: 12/21/2022 14:28   CT Cervical Spine Wo Contrast  Result Date: 12/21/2022 CLINICAL DATA:  Syncope, multiple falls EXAM: CT HEAD WITHOUT CONTRAST CT CERVICAL SPINE WITHOUT CONTRAST TECHNIQUE: Multidetector CT imaging of the head and cervical spine was performed following the standard protocol without intravenous contrast.  Multiplanar CT image reconstructions of the cervical spine were also generated. RADIATION DOSE REDUCTION: This exam was performed according to the departmental dose-optimization program which includes automated exposure control, adjustment of the mA and/or kV according to patient size and/or use of iterative reconstruction technique. COMPARISON:  04/29/2022 FINDINGS: CT HEAD FINDINGS Brain: No evidence of acute infarction, hemorrhage, hydrocephalus, extra-axial collection or mass lesion/mass effect. Mild periventricular white matter hypodensity. Vascular: No hyperdense vessel or unexpected calcification. Skull: Normal. Negative for fracture or focal lesion. Sinuses/Orbits: No acute finding. Other: None. CT CERVICAL SPINE FINDINGS Alignment: Normal. Skull base and vertebrae: No acute fracture. No primary bone lesion or focal pathologic process. Soft tissues and spinal canal: No prevertebral fluid or swelling. No visible canal hematoma. Disc levels: Generally mild multilevel disc space height loss and osteophytosis, focally moderate at C5-C6. Upper chest: Negative. Other: None. IMPRESSION: 1. No acute intracranial pathology. Small-vessel white matter disease. 2. No fracture or static subluxation of the cervical spine. 3. Generally mild multilevel disc space height loss and osteophytosis, focally moderate at C5-C6. Electronically Signed   By: Delanna Ahmadi M.D.   On: 12/21/2022 14:28   CT CHEST ABDOMEN PELVIS WO CONTRAST  Result Date: 12/21/2022 CLINICAL DATA:  Multiple falls, chest pain EXAM: CT CHEST, ABDOMEN AND PELVIS WITHOUT CONTRAST TECHNIQUE: Multidetector CT imaging of the chest, abdomen and pelvis was performed following the standard protocol without IV contrast. RADIATION DOSE REDUCTION: This exam was performed according to the departmental dose-optimization program which includes automated exposure control, adjustment of the mA and/or kV according to patient size and/or use of iterative reconstruction  technique. COMPARISON:  CT chest, 06/16/2022 FINDINGS: CT CHEST FINDINGS Cardiovascular: Aortic atherosclerosis. Normal heart size. Extensive three-vessel coronary artery calcifications and or stents status post median sternotomy and CABG. Mediastinum/Nodes: No enlarged mediastinal, hilar, or axillary lymph nodes. Thyroid gland, trachea, and esophagus demonstrate no significant findings. Lungs/Pleura: Mild pulmonary fibrosis in a pattern with apical to basal gradient featuring irregular peripheral interstitial opacity and ground-glass, with some evidence of subpleural sparing. No pleural effusion or pneumothorax. Musculoskeletal: No chest wall abnormality. No acute osseous findings. CT ABDOMEN PELVIS FINDINGS Hepatobiliary: No focal liver abnormality is seen. Status post cholecystectomy. No biliary dilatation. Pancreas: Unremarkable. No pancreatic ductal dilatation or surrounding inflammatory changes. Spleen: Normal in size without significant abnormality. Adrenals/Urinary Tract: Adrenal glands are unremarkable. Kidneys are normal, without renal calculi, solid lesion, or hydronephrosis. Bladder is unremarkable. Stomach/Bowel: Stomach is within normal limits. Appendix is not clearly visualized. No evidence of bowel wall thickening, distention, or inflammatory changes. Moderate burden of stool and stool balls throughout the colon and rectum. Vascular/Lymphatic: Aortic atherosclerosis. No enlarged abdominal or pelvic lymph nodes. Reproductive: Status post hysterectomy. Other: No abdominal wall hernia or abnormality. No ascites. Musculoskeletal: No acute osseous findings. IMPRESSION: 1. No noncontrast CT evidence of acute traumatic injury to the chest, abdomen, or pelvis. 2. Mild pulmonary fibrosis in a pattern with apical to basal gradient featuring  irregular peripheral interstitial opacity and ground-glass, with some evidence of subpleural sparing. This appearance suggests NSIP or chronic sequelae of prior COVID  airspace disease and may be further assessed by pulmonary referral and dedicated ILD protocol CT of the chest on a nonemergent, outpatient basis if desired. 3. Coronary artery disease. 4. Status post cholecystectomy, hysterectomy, and CABG. 5. Moderate burden of stool and stool balls throughout the colon and rectum. Aortic Atherosclerosis (ICD10-I70.0). Electronically Signed   By: Delanna Ahmadi M.D.   On: 12/21/2022 14:25   DG Chest Portable 1 View  Result Date: 12/21/2022 CLINICAL DATA:  Dizziness with several falls and central chest pain EXAM: PORTABLE CHEST 1 VIEW COMPARISON:  Chest radiograph dated 05/02/2022 FINDINGS: Normal lung volumes. Similar peripheral reticulations. No pleural effusion or pneumothorax. Similar postsurgical cardiomediastinal silhouette. Median sternotomy wires are nondisplaced. No radiographic finding of acute displaced fracture. Right upper quadrant surgical clips. IMPRESSION: 1. No active disease. 2.  No radiographic finding of acute displaced fracture. Electronically Signed   By: Darrin Nipper M.D.   On: 12/21/2022 13:38    Orson Eva, DO  [facility]  If 7PM-7AM, please contact night-coverage [email or web address] Password [email or web address] 12/22/2022, 4:49 PM   LOS: 0 days

## 2022-12-22 NOTE — H&P (View-Only) (Signed)
@LOGO @   Referring Provider: [facility] hospitalist Primary Care Physician:  Patient, [name] Per Primary Gastroenterologist:  Dr. Jenetta Downer, previously unassigned  Date of Admission: 12/21/2022 Date of Consultation: 12/22/2022  Reason for Consultation: Symptomatic acute on chronic iron deficiency anemia  HPI:  Meghan Terry is a 62 y.o. 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  2 units PRBCs ordered, admitted to the hospital for further evaluation and management, and GI consulted.  Additional labs in the emergency room showed ferritin 5, iron 13, saturation 4%, folate 5.5, B12 277.  Sodium 132, potassium 3.2, creatinine 2.9.  She also had CT chest abdomen pelvis without contrast in the emergency room with no acute findings.  Mild pulmonary fibrosis and pattern with apical basal gradient featuring irregular peripheral interstitial opacity with groundglass, some evidence of subpleural sparing.  Also with moderate burden of stool and stool balls throughout the colon and rectum.  CT cervical spine without contrast with no acute findings.  Consult:  Dizzy spells for the last couple of weeks and fell yesterday and came to the hospital. Had been falling a couple times a day. Had some chest pain off and on and shortness of breath. SOB has improved.   No brbpr or melena. Has constipation. Last BM was Monday a week ago. This is chronic. Takes Linzess 145 mcg as needed. Will take ever 3 days or so. Helps some. Took Thursday, but no BM. Feels like she needs to go, but can't. No abdominal pain.  Has nausea/vomiting  intermittently. Occurs with dizziness. Not associated with meals. Doesn't have a great appetite. Food doesn't taste good.Started a couple weeks ago. Feels like something burt the inside of her mouth. No heartburn. Last couple days hasn't been able to swallow pills, only able to take it in applesauce or baby bananas or pills will get stuck and she has to regurgitate. No trouble with foods or liquids.   Granddaughter who is 5 eats more than she does.   Started Trulicity 1.5 month ago and started losing weight. Lost about 30 lbs.     NSAID: None aside from 81 mg aspirin.  Last colonoscopy: Last year in [place]. Can't remember where, but across the street from hospital. No polyps.  Last EGD: Never.   Last dose of Plavix: Sunday 3/24  Past Medical History:  Diagnosis Date   Acute diastolic CHF (congestive heart failure) ([place]) 06/08/2021   Acute pulmonary edema ([facility])    Anemia due to stage 4 chronic kidney disease ([place]) 07/20/2022   Angina pectoris ([place]) 11/17/2019   Anxiety    Atypical chest pain 12/30/2020   Benign hypertension with CKD (chronic kidney disease) stage IV ([place]) 07/20/2022   CHF (congestive heart failure) ([place]) 06/08/2021   CKD (chronic kidney disease) stage 3b, GFR 30-59 ml/min 12/03/2019   Coronary artery disease    Coronary artery disease involving native coronary artery of native heart with angina pectoris ([place]) 10/03/2019   Depression 04/25/2017   Diabetes mellitus due to underlying condition with unspecified complications ([place]) [id number]   Diabetes mellitus with stage 4 chronic kidney disease GFR 15-29 ([place]) 07/20/2022   Essential hypertension 09/05/2019  Ex-smoker 09/05/2019   Family history of coronary artery disease 09/05/2019   History of depression 04/15/2020   Hx of diabetes mellitus 04/15/2020   Hyperlipidemia with target LDL less than 70 11/24/2019   Hyperphosphatemia 07/20/2022   Hypertension    Hypokalemia 04/30/2022   Hypothyroid    Migraine  04/25/2017   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   Syncope and collapse 04/29/2022   Thyroid disease    UTI (urinary tract infection) 04/30/2022    Past Surgical History:  Procedure Laterality Date   ABDOMINAL HYSTERECTOMY     APPENDECTOMY     CARDIAC CATHETERIZATION  11/22/2019   CHOLECYSTECTOMY     COLONOSCOPY     CORONARY ARTERY BYPASS GRAFT N/A 09/15/2019   Procedure: CORONARY ARTERY BYPASS GRAFTING (CABG) times three on pump using left internal mammary artery and right and left greater saphenous veins harvested endoscopically;  Surgeon: Gaye Pollack, MD;  Location: [place] [facility];  Service: Open Heart Surgery;  Laterality: N/A;   CORONARY STENT INTERVENTION N/A 11/23/2019   Procedure: CORONARY STENT INTERVENTION;  Surgeon: [name], MD;  Location: [place] [facility] [facility];  Service: Cardiovascular;  Laterality: N/A;   KNEE ARTHROSCOPY     LEFT HEART CATH AND CORONARY ANGIOGRAPHY N/A 09/14/2019   Procedure: LEFT HEART CATH AND CORONARY ANGIOGRAPHY;  Surgeon: [name], MD;  Location: [place] [facility];  Service: Cardiovascular;  Laterality: N/A;   LEFT HEART CATH AND CORS/GRAFTS ANGIOGRAPHY N/A 11/22/2019   Procedure: LEFT HEART CATH AND CORS/GRAFTS ANGIOGRAPHY;  Surgeon: [name], MD;  Location: [place] [facility] [facility];  Service: Cardiovascular;  Laterality: N/A;   LEFT HEART CATH AND CORS/GRAFTS ANGIOGRAPHY N/A 03/13/2020   Procedure: LEFT HEART CATH AND CORS/GRAFTS ANGIOGRAPHY;  Surgeon: Lorretta Harp, MD;  Location: [place] [facility];  Service: Cardiovascular;  Laterality: N/A;   LEFT HEART CATH AND CORS/GRAFTS ANGIOGRAPHY N/A 12/31/2020   Procedure: LEFT HEART  CATH AND CORS/GRAFTS ANGIOGRAPHY;  Surgeon: Belva Crome, MD;  Location: [place];  Service: Cardiovascular;  Laterality: N/A;   OSTEOCHONDRAL DEFECT REPAIR/RECONSTRUCTION Left 11/29/2014   Procedure: LEFT ANKLE MEDIAL MALLEOLUS OSTEOTOMY,AUTO GRAFT FROM CALCANEOUS;  OS TIBIA DENORO GRAFTING TALUS;  Surgeon: Wylene Simmer, MD;  Location: [facility];  Service: Orthopedics;  Laterality: Left;   TEE WITHOUT CARDIOVERSION N/A 09/15/2019   Procedure: TRANSESOPHAGEAL ECHOCARDIOGRAM (TEE);  Surgeon: Gaye Pollack, MD;  Location: [place] [facility];  Service: Open Heart Surgery;  Laterality: N/A;   TUBAL LIGATION      Prior to Admission medications   Medication Sig Start Date End Date Taking? Authorizing Provider  acetaminophen (TYLENOL) 500 MG tablet Take 2 tablets by mouth 3 times daily as needed for pain 12/08/21  Yes   aspirin EC 81 MG tablet Take 1 tablet (81 mg total) by mouth daily. 09/05/19  Yes Revankar, Reita Cliche, MD  buPROPion (WELLBUTRIN XL) 300 MG 24 hr tablet Take 1 tablet (300 mg total) by mouth daily. 07/06/22  Yes   cholecalciferol (VITAMIN D3) 25 MCG (1000 UNIT) tablet Take 2 tablets (50 mcg total) by mouth daily. Patient taking differently: Take 1,000 Units by mouth  in the morning and at bedtime. 08/12/22  Yes   clonazePAM (KLONOPIN) 0.5 MG tablet Take 1 tablet (0.5 mg total) by mouth 3 (three) times daily. Patient taking differently: Take 0.5 mg by mouth at bedtime. 11/03/22  Yes   clopidogrel (PLAVIX) 75 MG tablet TAKE 1 TABLET BY MOUTH DAILY Patient taking differently: Take 75 mg by mouth daily. 03/30/22  Yes Revankar, Reita Cliche, MD  dapagliflozin propanediol (FARXIGA) 10 MG TABS tablet Take 1 tablet (10 mg total) by mouth daily before breakfast. 05/12/22  Yes Revankar, Reita Cliche, MD  Dulaglutide (TRULICITY) 1.5 0000000 SOPN Inject 1.5 mg into the skin once a week. 11/03/22  Yes   furosemide (LASIX) 40 MG tablet Take 1.5 tablets (60 mg total) by mouth 2 (two) times daily. Patient  taking differently: Take 60 mg by mouth daily. 07/06/22  Yes   levothyroxine (SYNTHROID) 50 MCG tablet TAKE 1 TABLET BY MOUTH ONCE DAILY ON AN EMPTY STOMACH 30 MINUTES BEFORE BREAKFAST ON SAT AND SUNDAY Patient taking differently: Take 50 mcg by mouth See admin instructions. Take 1 tablet in the morning with breakfast on SATURDAY AND SUNDAY. 05/28/22  Yes   levothyroxine (SYNTHROID) 75 MCG tablet Take 1 tablet (75 mcg total) by mouth daily Monday thru Friday. 06/08/22  Yes   metoprolol succinate (TOPROL-XL) 25 MG 24 hr tablet Take 1 tablet (25 mg total) by mouth at bedtime. Take with or immediately following a meal. 09/17/22  Yes Revankar, Reita Cliche, MD  nitroGLYCERIN (NITROSTAT) 0.4 MG SL tablet Place 1 tablet (0.4 mg total) under the tongue every 5 (five) minutes as needed for chest pain. 12/29/21  Yes Revankar, Reita Cliche, MD  ondansetron (ZOFRAN) 4 MG tablet take 1 tablet (4 mg) by mouth 2 times per day as needed for nausea Patient taking differently: Take 4 mg by mouth 2 (two) times daily as needed for nausea or vomiting. 05/12/22  Yes   Potassium Chloride ER 20 MEQ TBCR Take 1 tablet (20 mEq total) by mouth daily. 11/12/22  Yes   promethazine (PHENERGAN) 25 MG tablet take 1 tablet (25 mg) by mouth every 6 hours as needed for 30 days Patient taking differently: Take 25 mg by mouth every 6 (six) hours as needed for nausea or vomiting. 07/30/22  Yes   ranolazine (RANEXA) 500 MG 12 hr tablet Take 1 tablet by mouth 2 times daily 12/25/21  Yes Revankar, Reita Cliche, MD  rOPINIRole (REQUIP) 1 MG tablet Take 1 tablet (1 mg total) by mouth 3 (three) times daily. 11/12/22  Yes   rOPINIRole (REQUIP) 4 MG tablet Take 1 tablet (4 mg total) by mouth 1 to 3 hours before bedtime. 06/29/22  Yes   rosuvastatin (CRESTOR) 40 MG tablet Take 1 tablet (40 mg total) by mouth daily. Patient taking differently: Take 40 mg by mouth at bedtime. 07/27/22  Yes   topiramate (TOPAMAX) 100 MG tablet TAKE 1 TABLET BY MOUTH TWICE DAILY. 05/12/22   Yes   traMADol (ULTRAM) 50 MG tablet Take 1 tablet (50 mg total) by mouth every 6 (six) hours. Patient taking differently: Take 50 mg by mouth daily as needed for moderate pain. 11/03/22  Yes   traZODone (DESYREL) 150 MG tablet Take 1 tablet (150 mg total) by mouth in the morning and at bedtime. 11/16/22  Yes   traZODone (DESYREL) 50 MG tablet Take 1 tablet (50 mg total) by mouth daily. 11/03/22  Yes     Current Facility-Administered Medications  Medication Dose Route Frequency Provider Last  Rate Last Admin   0.9 %  sodium chloride infusion (Manually program via Guardrails IV Fluids)   Intravenous Once Godfrey Pick, MD       acetaminophen (TYLENOL) tablet 650 mg  650 mg Oral Q6H PRN Tat, Shanon Brow, MD       Or   acetaminophen (TYLENOL) suppository 650 mg  650 mg Rectal Q6H PRN Tat, Shanon Brow, MD       buPROPion (WELLBUTRIN XL) 24 hr tablet 300 mg  300 mg Oral Daily Tat, David, MD   300 mg at 12/21/22 2020   cholecalciferol (VITAMIN D3) 25 MCG (1000 UNIT) tablet 1,000 Units  1,000 Units Oral Daily Tat, David, MD   1,000 Units at 12/21/22 2020   clonazePAM (KLONOPIN) tablet 0.5 mg  0.5 mg Oral QHS Orson Eva, MD   0.5 mg at 12/21/22 2028   insulin aspart (novoLOG) injection 0-5 Units  0-5 Units Subcutaneous QHS Tat, Shanon Brow, MD       insulin aspart (novoLOG) injection 0-9 Units  0-9 Units Subcutaneous TID WC Tat, Shanon Brow, MD       levothyroxine (SYNTHROID) tablet 75 mcg  75 mcg Oral Daily Tat, David, MD   75 mcg at 12/22/22 0500   metoprolol succinate (TOPROL-XL) 24 hr tablet 25 mg  25 mg Oral QHS Tat, Shanon Brow, MD       ondansetron [facility]) tablet 4 mg  4 mg Oral Q6H PRN Tat, David, MD       Or   ondansetron [facility]) injection 4 mg  4 mg Intravenous Q6H PRN Tat, Shanon Brow, MD   4 mg at 12/21/22 1902   potassium chloride SA (KLOR-CON M) CR tablet 20 mEq  20 mEq Oral Daily Tat, David, MD   20 mEq at 12/21/22 2020   ranolazine (RANEXA) 12 hr tablet 500 mg  500 mg Oral BID Tat, Shanon Brow, MD   500 mg at 12/21/22 2027    rOPINIRole (REQUIP) tablet 1 mg  1 mg Oral TID WC Tat, Shanon Brow, MD       rOPINIRole (REQUIP) tablet 4 mg  4 mg Oral Benay Pike, MD   4 mg at 12/21/22 2027   rosuvastatin (CRESTOR) tablet 40 mg  40 mg Oral Daily Tat, Shanon Brow, MD   40 mg at 12/21/22 1901   topiramate (TOPAMAX) tablet 100 mg  100 mg Oral BID Orson Eva, MD   100 mg at 12/21/22 2028   traMADol (ULTRAM) tablet 50 mg  50 mg Oral Q12H PRN Orson Eva, MD   50 mg at 12/21/22 1901   traZODone (DESYREL) tablet 150 mg  150 mg Oral Benay Pike, MD   150 mg at 12/21/22 2028    Allergies as of 12/21/2022 - Review Complete 12/21/2022  Allergen Reaction Noted   Abilify [aripiprazole]  08/10/2022   Ciprofloxacin Nausea And Vomiting 08/10/2022   Carbidopa-levodopa Other (See Comments) 05/14/2017   Prednisone Other (See Comments) 09/07/2019   Venlafaxine Other (See Comments) 05/14/2017    Family History  Problem Relation Age of Onset   Asthma Mother    COPD Mother    Diabetes Mother    Macular degeneration Mother    Congestive Heart Failure Father     Social History   Socioeconomic History   Marital status: Married    Spouse name: Ajada Ghali   Number of children: 2   Years of education: Not on file   Highest education level: Associate degree: occupational, Hotel manager, or vocational program  Occupational History   Occupation: disabilty  Comment: EMT/CNA @ [employer]  Tobacco Use   Smoking status: Former    Packs/day: 1.00    Years: 47.00    Additional pack years: 0.00    Total pack years: 47.00    Types: Cigarettes    Quit date: 09/15/2019    Years since quitting: 3.2    Passive exposure: Past   Smokeless tobacco: Never  Vaping Use   Vaping Use: Every day   Start date: 09/15/2019   Substances: Nicotine  Substance and Sexual Activity   Alcohol use: No   Drug use: No   Sexual activity: Not on file  Other Topics Concern   Not on file  Social History Narrative   Not on file   Social Determinants of  Health   Financial Resource Strain: High Risk (06/19/2021)   Overall Financial Resource Strain (CARDIA)    Difficulty of Paying Living Expenses: Very hard  Food Insecurity: No Food Insecurity (12/21/2022)   Hunger Vital Sign    Worried About Running Out of Food in the Last Year: Never true    Ran Out of Food in the Last Year: Never true  Transportation Needs: No Transportation Needs (12/21/2022)   PRAPARE - Hydrologist (Medical): No    Lack of Transportation (Non-Medical): No  Physical Activity: Not on file  Stress: Not on file  Social Connections: Not on file  Intimate Partner Violence: Not At Risk (12/21/2022)   Humiliation, Afraid, Rape, and Kick questionnaire    Fear of Current or Ex-Partner: No    Emotionally Abused: No    Physically Abused: No    Sexually Abused: No    Review of Systems: Gen: Denies fever, chills, cold or flu like symptoms, pre-syncope, or syncope.  CV: See HPI Resp: See HPI GI: See HPI GU : Denies urinary burning, urinary frequency, urinary incontinence.  MS: Denies joint pain. Derm: Denies rash. Psych: Denies depression, anxiety. Heme: See HPI  Physical Exam: Vital signs in last 24 hours: Temp:  [97.8 F (36.6 C)-98.3 F (36.8 C)] 97.8 F (36.6 C) (03/26 0635) Pulse Rate:  [83-90] 87 (03/26 0635) Resp:  [16-20] 17 (03/26 0635) BP: (89-131)/(50-97) 114/58 (03/26 0635) SpO2:  [98 %-100 %] 100 % (03/26 [id number]) Weight:  [59.8 kg-59.9 kg] 59.8 kg (03/25 1648) Last BM Date : 12/16/22 (per patient) General:   Alert,  Well-developed, well-nourished, pleasant and cooperative in NAD Head:  Normocephalic and atraumatic. Eyes:  Sclera clear, no icterus.   Conjunctiva pink. Ears:  Normal auditory acuity. Nose:  No deformity. Mouth:  No deformity or lesions. Atrophic glossitis.  Neck:  Supple; no masses or thyromegaly. Lungs:  Clear throughout to auscultation.   No wheezes, crackles, or rhonchi. No acute distress. Heart:   Regular rate and rhythm; no murmurs, clicks, rubs,  or gallops. Abdomen:  Soft, nontender and nondistended. No masses, hepatosplenomegaly or hernias noted. Normal bowel sounds, without guarding, and without rebound.   Rectal:  Deferred  Msk:  Symmetrical without gross deformities. Normal posture. Extremities:  Without edema. Neurologic:  Alert and  oriented x4;  grossly normal neurologically. Skin:  Intact without significant lesions or rashes. Psych: Normal mood and affect.  Intake/Output from previous day: 03/25 0701 - 03/26 0700 In: 1850 [P.O.:600; I.V.:750; IV Piggyback:500] Out: -  Intake/Output this shift: No intake/output data recorded.  Lab Results: Recent Labs    12/21/22 1313 12/22/22 0417  WBC 7.0 6.3  HGB 5.5* 5.1*  HCT 18.6* 18.0*  PLT  328 281   BMET Recent Labs    12/21/22 1313 12/22/22 0417  NA 132* 137  K 3.2* 4.0  CL 106 111  CO2 19* 20*  GLUCOSE 109* 78  BUN 29* 25*  CREATININE 2.90* 2.77*  CALCIUM 8.5* 8.6*   LFT Recent Labs    12/21/22 1313  PROT 6.0*  ALBUMIN 3.6  AST 15  ALT 14  ALKPHOS 67  BILITOT 0.4    Studies/Results: CT Head Wo Contrast  Result Date: 12/21/2022 CLINICAL DATA:  Syncope, multiple falls EXAM: CT HEAD WITHOUT CONTRAST CT CERVICAL SPINE WITHOUT CONTRAST TECHNIQUE: Multidetector CT imaging of the head and cervical spine was performed following the standard protocol without intravenous contrast. Multiplanar CT image reconstructions of the cervical spine were also generated. RADIATION DOSE REDUCTION: This exam was performed according to the departmental dose-optimization program which includes automated exposure control, adjustment of the mA and/or kV according to patient size and/or use of iterative reconstruction technique. COMPARISON:  04/29/2022 FINDINGS: CT HEAD FINDINGS Brain: No evidence of acute infarction, hemorrhage, hydrocephalus, extra-axial collection or mass lesion/mass effect. Mild periventricular white matter  hypodensity. Vascular: No hyperdense vessel or unexpected calcification. Skull: Normal. Negative for fracture or focal lesion. Sinuses/Orbits: No acute finding. Other: None. CT CERVICAL SPINE FINDINGS Alignment: Normal. Skull base and vertebrae: No acute fracture. No primary bone lesion or focal pathologic process. Soft tissues and spinal canal: No prevertebral fluid or swelling. No visible canal hematoma. Disc levels: Generally mild multilevel disc space height loss and osteophytosis, focally moderate at C5-C6. Upper chest: Negative. Other: None. IMPRESSION: 1. No acute intracranial pathology. Small-vessel white matter disease. 2. No fracture or static subluxation of the cervical spine. 3. Generally mild multilevel disc space height loss and osteophytosis, focally moderate at C5-C6. Electronically Signed   By: Delanna Ahmadi M.D.   On: 12/21/2022 14:28   CT Cervical Spine Wo Contrast  Result Date: 12/21/2022 CLINICAL DATA:  Syncope, multiple falls EXAM: CT HEAD WITHOUT CONTRAST CT CERVICAL SPINE WITHOUT CONTRAST TECHNIQUE: Multidetector CT imaging of the head and cervical spine was performed following the standard protocol without intravenous contrast. Multiplanar CT image reconstructions of the cervical spine were also generated. RADIATION DOSE REDUCTION: This exam was performed according to the departmental dose-optimization program which includes automated exposure control, adjustment of the mA and/or kV according to patient size and/or use of iterative reconstruction technique. COMPARISON:  04/29/2022 FINDINGS: CT HEAD FINDINGS Brain: No evidence of acute infarction, hemorrhage, hydrocephalus, extra-axial collection or mass lesion/mass effect. Mild periventricular white matter hypodensity. Vascular: No hyperdense vessel or unexpected calcification. Skull: Normal. Negative for fracture or focal lesion. Sinuses/Orbits: No acute finding. Other: None. CT CERVICAL SPINE FINDINGS Alignment: Normal. Skull base and  vertebrae: No acute fracture. No primary bone lesion or focal pathologic process. Soft tissues and spinal canal: No prevertebral fluid or swelling. No visible canal hematoma. Disc levels: Generally mild multilevel disc space height loss and osteophytosis, focally moderate at C5-C6. Upper chest: Negative. Other: None. IMPRESSION: 1. No acute intracranial pathology. Small-vessel white matter disease. 2. No fracture or static subluxation of the cervical spine. 3. Generally mild multilevel disc space height loss and osteophytosis, focally moderate at C5-C6. Electronically Signed   By: Delanna Ahmadi M.D.   On: 12/21/2022 14:28   CT CHEST ABDOMEN PELVIS WO CONTRAST  Result Date: 12/21/2022 CLINICAL DATA:  Multiple falls, chest pain EXAM: CT CHEST, ABDOMEN AND PELVIS WITHOUT CONTRAST TECHNIQUE: Multidetector CT imaging of the chest, abdomen and pelvis was performed following the  standard protocol without IV contrast. RADIATION DOSE REDUCTION: This exam was performed according to the departmental dose-optimization program which includes automated exposure control, adjustment of the mA and/or kV according to patient size and/or use of iterative reconstruction technique. COMPARISON:  CT chest, 06/16/2022 FINDINGS: CT CHEST FINDINGS Cardiovascular: Aortic atherosclerosis. Normal heart size. Extensive three-vessel coronary artery calcifications and or stents status post median sternotomy and CABG. Mediastinum/Nodes: No enlarged mediastinal, hilar, or axillary lymph nodes. Thyroid gland, trachea, and esophagus demonstrate no significant findings. Lungs/Pleura: Mild pulmonary fibrosis in a pattern with apical to basal gradient featuring irregular peripheral interstitial opacity and ground-glass, with some evidence of subpleural sparing. No pleural effusion or pneumothorax. Musculoskeletal: No chest wall abnormality. No acute osseous findings. CT ABDOMEN PELVIS FINDINGS Hepatobiliary: No focal liver abnormality is seen. Status  post cholecystectomy. No biliary dilatation. Pancreas: Unremarkable. No pancreatic ductal dilatation or surrounding inflammatory changes. Spleen: Normal in size without significant abnormality. Adrenals/Urinary Tract: Adrenal glands are unremarkable. Kidneys are normal, without renal calculi, solid lesion, or hydronephrosis. Bladder is unremarkable. Stomach/Bowel: Stomach is within normal limits. Appendix is not clearly visualized. No evidence of bowel wall thickening, distention, or inflammatory changes. Moderate burden of stool and stool balls throughout the colon and rectum. Vascular/Lymphatic: Aortic atherosclerosis. No enlarged abdominal or pelvic lymph nodes. Reproductive: Status post hysterectomy. Other: No abdominal wall hernia or abnormality. No ascites. Musculoskeletal: No acute osseous findings. IMPRESSION: 1. No noncontrast CT evidence of acute traumatic injury to the chest, abdomen, or pelvis. 2. Mild pulmonary fibrosis in a pattern with apical to basal gradient featuring irregular peripheral interstitial opacity and ground-glass, with some evidence of subpleural sparing. 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 3. Coronary artery disease. 4. Status post cholecystectomy, hysterectomy, and CABG. 5. Moderate burden of stool and stool balls throughout the colon and rectum. Aortic Atherosclerosis (ICD10-I70.0). Electronically Signed   By: Delanna Ahmadi M.D.   On: 12/21/2022 14:25   DG Chest Portable 1 View  Result Date: 12/21/2022 CLINICAL DATA:  Dizziness with several falls and central chest pain EXAM: PORTABLE CHEST 1 VIEW COMPARISON:  Chest radiograph dated 05/02/2022 FINDINGS: Normal lung volumes. Similar peripheral reticulations. No pleural effusion or pneumothorax. Similar postsurgical cardiomediastinal silhouette.  Median sternotomy wires are nondisplaced. No radiographic finding of acute displaced fracture. Right upper quadrant surgical clips. IMPRESSION: 1. No active disease. 2.  No radiographic finding of acute displaced fracture. Electronically Signed   By: Darrin Nipper M.D.   On: 12/21/2022 13:38    Impression:  62 y.o. 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  She is admitted for further evaluation and GI consulted for assistance.  Acute on chronic symptomatic anemia: Recent baseline hemoglobin in the 9-10 range.  Hemoglobin 5.5 on admission, 5.1 this morning, in process of receiving 2 units PRBCs today.  FOBT negative.  She has evidence of iron, folate deficiency.  B12 low normal at 277.  Denies overt GI bleeding.  Admits to decreased appetite, change  in taste, intermittent nausea/vomiting occurring with dizziness and not associated with meals.  She has noted some new onset pill dysphagia in the last couple of days.  No food or liquid dysphagia.  She has had significant unintentional weight loss since December, total of 29 pounds.  States weight loss started after starting Trulicity about 1.5 months ago.  Reports colonoscopy within the last year in [place] without polyps. No prior EGD. Last dose of Plavix Sunday, 3/24. No NSAIDs aside from a 1 mg aspirin.  CT A/P without contrast this admission with no findings.  Etiology of IDA is not clear. Differentials include PUD, AVMs, malignancy, polyps. Recommend starting with EGD tomorrow. Will add on possible dilation as appropriate. Consider  colonoscopy if EGD is unrevealing. Will also empirically start PPI BID for now. Can discontinue if EGD is unrevealing.   Chronic constipation: Not adequately managed.  Taking Linzess 145 mcg as needed outpatient with inadequate results.  CT A/P this admission with moderate burden of stool and stool balls throughout the colon and rectum.  We will start her on Linzess 290 mcg daily and give suppository today.   Plan:  EGD +/- dilation with propofol with Dr. Gala Romney tomorrow.  The risks, benefits, and alternatives have been discussed with the patient in detail. The patient states understanding and desires to proceed.  Continue to hold Plavix and aspirin for now. Last dose of Plavix 3/24.  NPO at midnight.  Agree with transfusing 2 units PRBCs.  Continue to monitor H/H and transfuse as needed.  Start folic acid 1 mg daily. Recommend B12 supplementation as well.  She will need iron supplementation. Could receive IV iron while inpatient.  Linzess 290 mcg daily.  Dulcolax suppository 10 mg today.  Zofran PRN.    LOS: 0 days    12/22/2022, 8:02 AM   Aliene Altes, [facility]

## 2022-12-22 NOTE — Progress Notes (Signed)
Critical result. MD notified   12/22/22 0631  Provider Notification  Provider Name/Title Dr. Josephine Cables  Date Provider Notified 12/22/22  Time Provider Notified [phone]  Method of Notification Page  Notification Reason Critical Result  Test performed and critical result hbg 5.5  Date Critical Result Received 12/22/22  Time Critical Result Received 0630  Provider response No new orders

## 2022-12-22 NOTE — Progress Notes (Signed)
Spoke with Jasmine in bloodbank/lab about patients blood that is coming from [facility] with special antibodies. The blood has not arrived yet. Charge RN aware. Plan of care ongoing.

## 2022-12-22 NOTE — Progress Notes (Signed)
Mobility Specialist Progress Note:    12/22/22 0845  Mobility  Activity Ambulated with assistance in hallway  Level of Assistance Contact guard assist, steadying assist  Assistive Device None  Distance Ambulated (ft) 150 ft  Activity Response Tolerated well  Mobility Referral Yes  $Mobility charge 1 Mobility   Pt agreeable to mobility session. Tolerated well, c/o "blurry vision" took 1 standing rest break to recover. SpO2 99% on RA throughout. Pt stated "blurry eyed" got worse with standing still. Returned pt to room, pt stated dizziness had gotten better. Left pt in care of nurse, all needs met.   Royetta Crochet Mobility Specialist Please contact via Solicitor or  Rehab office at [phone]

## 2022-12-22 NOTE — Progress Notes (Signed)
Pt receiving blood 

## 2022-12-23 ENCOUNTER — Encounter (HOSPITAL_COMMUNITY)
Admission: EM | Disposition: A | Discharge status: Home / Self Care | Payer: Self-pay | Source: Home / Self Care | Attending: Family Medicine

## 2022-12-23 ENCOUNTER — Encounter (HOSPITAL_COMMUNITY): Payer: Self-pay | Admitting: Internal Medicine

## 2022-12-23 ENCOUNTER — Observation Stay (HOSPITAL_COMMUNITY): Payer: Medicare Other | Admitting: Anesthesiology

## 2022-12-23 DIAGNOSIS — K209 Esophagitis, unspecified without bleeding: Secondary | ICD-10-CM | POA: Diagnosis present

## 2022-12-23 DIAGNOSIS — D509 Iron deficiency anemia, unspecified: Secondary | ICD-10-CM

## 2022-12-23 DIAGNOSIS — Z87891 Personal history of nicotine dependence: Secondary | ICD-10-CM

## 2022-12-23 DIAGNOSIS — K259 Gastric ulcer, unspecified as acute or chronic, without hemorrhage or perforation: Secondary | ICD-10-CM | POA: Diagnosis present

## 2022-12-23 DIAGNOSIS — I110 Hypertensive heart disease with heart failure: Secondary | ICD-10-CM

## 2022-12-23 DIAGNOSIS — I5032 Chronic diastolic (congestive) heart failure: Secondary | ICD-10-CM | POA: Diagnosis present

## 2022-12-23 DIAGNOSIS — K2950 Unspecified chronic gastritis without bleeding: Secondary | ICD-10-CM | POA: Diagnosis not present

## 2022-12-23 DIAGNOSIS — Z955 Presence of coronary angioplasty implant and graft: Secondary | ICD-10-CM

## 2022-12-23 DIAGNOSIS — E039 Hypothyroidism, unspecified: Secondary | ICD-10-CM | POA: Diagnosis present

## 2022-12-23 DIAGNOSIS — D649 Anemia, unspecified: Secondary | ICD-10-CM | POA: Diagnosis present

## 2022-12-23 DIAGNOSIS — D124 Benign neoplasm of descending colon: Secondary | ICD-10-CM | POA: Diagnosis present

## 2022-12-23 DIAGNOSIS — E1142 Type 2 diabetes mellitus with diabetic polyneuropathy: Secondary | ICD-10-CM | POA: Diagnosis present

## 2022-12-23 DIAGNOSIS — D7589 Other specified diseases of blood and blood-forming organs: Secondary | ICD-10-CM | POA: Diagnosis present

## 2022-12-23 DIAGNOSIS — D631 Anemia in chronic kidney disease: Secondary | ICD-10-CM | POA: Diagnosis present

## 2022-12-23 DIAGNOSIS — I252 Old myocardial infarction: Secondary | ICD-10-CM | POA: Diagnosis not present

## 2022-12-23 DIAGNOSIS — F419 Anxiety disorder, unspecified: Secondary | ICD-10-CM | POA: Diagnosis present

## 2022-12-23 DIAGNOSIS — I25119 Atherosclerotic heart disease of native coronary artery with unspecified angina pectoris: Secondary | ICD-10-CM | POA: Diagnosis not present

## 2022-12-23 DIAGNOSIS — I130 Hypertensive heart and chronic kidney disease with heart failure and stage 1 through stage 4 chronic kidney disease, or unspecified chronic kidney disease: Secondary | ICD-10-CM | POA: Diagnosis present

## 2022-12-23 DIAGNOSIS — I509 Heart failure, unspecified: Secondary | ICD-10-CM | POA: Diagnosis not present

## 2022-12-23 DIAGNOSIS — E876 Hypokalemia: Secondary | ICD-10-CM | POA: Diagnosis present

## 2022-12-23 DIAGNOSIS — N184 Chronic kidney disease, stage 4 (severe): Secondary | ICD-10-CM | POA: Diagnosis present

## 2022-12-23 DIAGNOSIS — E1122 Type 2 diabetes mellitus with diabetic chronic kidney disease: Secondary | ICD-10-CM | POA: Diagnosis present

## 2022-12-23 DIAGNOSIS — G2581 Restless legs syndrome: Secondary | ICD-10-CM | POA: Diagnosis present

## 2022-12-23 DIAGNOSIS — Z79899 Other long term (current) drug therapy: Secondary | ICD-10-CM | POA: Diagnosis not present

## 2022-12-23 DIAGNOSIS — F32A Depression, unspecified: Secondary | ICD-10-CM | POA: Diagnosis present

## 2022-12-23 DIAGNOSIS — I2510 Atherosclerotic heart disease of native coronary artery without angina pectoris: Secondary | ICD-10-CM | POA: Diagnosis present

## 2022-12-23 DIAGNOSIS — W19XXXA Unspecified fall, initial encounter: Secondary | ICD-10-CM | POA: Diagnosis present

## 2022-12-23 DIAGNOSIS — J8410 Pulmonary fibrosis, unspecified: Secondary | ICD-10-CM | POA: Diagnosis present

## 2022-12-23 DIAGNOSIS — I10 Essential (primary) hypertension: Secondary | ICD-10-CM | POA: Diagnosis not present

## 2022-12-23 DIAGNOSIS — Z951 Presence of aortocoronary bypass graft: Secondary | ICD-10-CM | POA: Diagnosis not present

## 2022-12-23 DIAGNOSIS — E538 Deficiency of other specified B group vitamins: Secondary | ICD-10-CM | POA: Diagnosis present

## 2022-12-23 DIAGNOSIS — D638 Anemia in other chronic diseases classified elsewhere: Secondary | ICD-10-CM

## 2022-12-23 DIAGNOSIS — E785 Hyperlipidemia, unspecified: Secondary | ICD-10-CM | POA: Diagnosis present

## 2022-12-23 DIAGNOSIS — Z8249 Family history of ischemic heart disease and other diseases of the circulatory system: Secondary | ICD-10-CM | POA: Diagnosis not present

## 2022-12-23 HISTORY — PX: ESOPHAGOGASTRODUODENOSCOPY (EGD) WITH PROPOFOL: SHX5813

## 2022-12-23 HISTORY — PX: ESOPHAGEAL DILATION: SHX303

## 2022-12-23 HISTORY — PX: BIOPSY: SHX5522

## 2022-12-23 LAB — HEMOGLOBIN A1C
Hgb A1c MFr Bld: 5.7 % — ABNORMAL HIGH (ref 4.8–5.6)
Mean Plasma Glucose: 117 mg/dL

## 2022-12-23 LAB — BASIC METABOLIC PANEL
Anion gap: 5 (ref 5–15)
BUN: 19 mg/dL (ref 8–23)
CO2: 19 mmol/L — ABNORMAL LOW (ref 22–32)
Calcium: 8.6 mg/dL — ABNORMAL LOW (ref 8.9–10.3)
Chloride: 112 mmol/L — ABNORMAL HIGH (ref 98–111)
Creatinine, Ser: 2.45 mg/dL — ABNORMAL HIGH (ref 0.44–1.00)
GFR, Estimated: 22 mL/min — ABNORMAL LOW (ref >60)
Glucose, Bld: 86 mg/dL (ref 70–99)
Potassium: 3.7 mmol/L (ref 3.5–5.1)
Sodium: 136 mmol/L (ref 135–145)

## 2022-12-23 LAB — TYPE AND SCREEN
ABO/RH(D): A POS
Antibody Screen: POSITIVE
Donor AG Type: NEGATIVE
Donor AG Type: NEGATIVE
PT AG Type: NEGATIVE
Unit division: 0
Unit division: 0

## 2022-12-23 LAB — GLUCOSE, CAPILLARY
Glucose-Capillary: 79 mg/dL (ref 70–99)
Glucose-Capillary: 67 mg/dL — ABNORMAL LOW (ref 70–99)
Glucose-Capillary: 62 mg/dL — ABNORMAL LOW (ref 70–99)
Glucose-Capillary: 73 mg/dL (ref 70–99)
Glucose-Capillary: 86 mg/dL (ref 70–99)
Glucose-Capillary: 84 mg/dL (ref 70–99)
Glucose-Capillary: 150 mg/dL — ABNORMAL HIGH (ref 70–99)

## 2022-12-23 LAB — BPAM RBC
Blood Product Expiration Date: 202404172359
Blood Product Expiration Date: 202404302359
ISSUE DATE / TIME: 202403260608
ISSUE DATE / TIME: 202403260836
Unit Type and Rh: 6200
Unit Type and Rh: 6200

## 2022-12-23 LAB — CBC
HCT: 29.9 % — ABNORMAL LOW (ref 36.0–46.0)
Hemoglobin: 8.8 g/dL — ABNORMAL LOW (ref 12.0–15.0)
MCH: 22.5 pg — ABNORMAL LOW (ref 26.0–34.0)
MCHC: 29.4 g/dL — ABNORMAL LOW (ref 30.0–36.0)
MCV: 76.5 fL — ABNORMAL LOW (ref 80.0–100.0)
Platelets: 311 10*3/uL (ref 150–400)
RBC: 3.91 MIL/uL (ref 3.87–5.11)
RDW: 19.8 % — ABNORMAL HIGH (ref 11.5–15.5)
WBC: 8.1 10*3/uL (ref 4.0–10.5)
nRBC: 0 % (ref 0.0–0.2)

## 2022-12-23 SURGERY — ESOPHAGOGASTRODUODENOSCOPY (EGD) WITH PROPOFOL
Anesthesia: General

## 2022-12-23 MED ORDER — PROPOFOL 10 MG/ML IV BOLUS
INTRAVENOUS | Status: DC | PRN
  Administered 2022-12-23: 20 mg via INTRAVENOUS
  Administered 2022-12-23 (×2): 50 mg via INTRAVENOUS
  Administered 2022-12-23: 20 mg via INTRAVENOUS
  Administered 2022-12-23: 30 mg via INTRAVENOUS
  Administered 2022-12-23: 50 mg via INTRAVENOUS
  Administered 2022-12-23: 30 mg via INTRAVENOUS
  Administered 2022-12-23: 50 mg via INTRAVENOUS
  Administered 2022-12-23: 30 mg via INTRAVENOUS

## 2022-12-23 MED ORDER — STERILE WATER FOR IRRIGATION IR SOLN
Status: DC | PRN
  Administered 2022-12-23: 180 mL

## 2022-12-23 MED ORDER — LACTATED RINGERS IV SOLN: INTRAVENOUS | Status: DC

## 2022-12-23 MED ORDER — SODIUM CHLORIDE 0.9 % IV SOLN: INTRAVENOUS | Status: DC

## 2022-12-23 MED ORDER — LIDOCAINE HCL 1 % IJ SOLN
INTRAMUSCULAR | Status: DC | PRN
  Administered 2022-12-23: 50 mg via INTRADERMAL

## 2022-12-23 MED ORDER — DEXTROSE 50 % IV SOLN
25.0000 mL | Freq: Once | INTRAVENOUS | Status: AC
  Administered 2022-12-23: 25 mL via INTRAVENOUS

## 2022-12-23 MED ORDER — DEXTROSE 50 % IV SOLN: 12.5000 g | Freq: Once | INTRAVENOUS | Status: AC

## 2022-12-23 MED ORDER — DEXTROSE 50 % IV SOLN
INTRAVENOUS | Status: AC
  Administered 2022-12-23: 12.5 g via INTRAVENOUS
  Filled 2022-12-23: qty 50

## 2022-12-23 NOTE — Op Note (Addendum)
[facility] Patient Name: Meghan Terry Procedure Date: 12/23/2022 10:47 AM MRN: [id number] Date of Birth: [birth year] Attending MD: Meghan Terry , MD, [id number] CSN: [id number] Age: 62 Admit Type: Outpatient Procedure:                Upper GI endoscopy Indications:              Iron deficiency anemia, Dysphagia, Weight loss Providers:                Meghan Richards, MD, Meghan Del, RN, Meghan Terry, Technician Referring MD:              Medicines:                Propofol per Anesthesia Complications:            No immediate complications. Estimated Blood Loss:     Estimated blood loss was minimal. Procedure:                Pre-Anesthesia Assessment:                           - Prior to the procedure, a History and Physical                            was performed, and patient medications and                            allergies were reviewed. The patient's tolerance of                            previous anesthesia was also reviewed. The risks                            and benefits of the procedure and the sedation                            options and risks were discussed with the patient.                            All questions were answered, and informed consent                            was obtained. Prior Anticoagulants: The patient has                            taken no anticoagulant or antiplatelet agents. ASA                            Grade Assessment: III - A patient with severe                            systemic disease. After reviewing the risks and  benefits, the patient was deemed in satisfactory                            condition to undergo the procedure.                           After obtaining informed consent, the endoscope was                            passed under direct vision. Throughout the                            procedure, the patient's blood pressure, pulse, and                             oxygen saturations were monitored continuously. The                            [id number]-[id number]) scope was introduced through the                            mouth, and advanced to the third part of duodenum.                            The upper GI endoscopy was accomplished without                            difficulty. The patient tolerated the procedure                            well. Scope In: 11:22:54 AM Scope Out: 11:43:31 AM Total Procedure Duration: 0 hours 20 minutes 37 seconds  Findings:      Esophagitis within 2 cm of the GE junction. It was somewhat excoriated       mucosa. Query pill induced injury. Otherwise, tubular esophagus appeared       patent throughout its course. Stomach empty. Somewhat angry appearing       mucosa diffusely patchy erythema and erosions. "Bullous like" appearing       mucosa. Scattered gastric erosions ; no ulcer or obvious infiltrating       process. Patent pylorus.      The duodenal bulb and second portion of the duodenum were normal.      Scope was withdrawn, a 54 [place] Maloney dilator was passed to full       insertion with mild resistance. A look back revealed a superficial tear       at the level of the UES and multiple very fine superficial linear areas       of excoriation. (See photographs)      Biopsies the antrum, body and fundus taken for histologic study; tissue       from fundal biopsies came off in chunks. There was some persisting       oozing at the biopsy sites. I elected to place 1 clip on each site with       good hemostasis. Finally I elected to biopsy the mid and distal  esophagus. Impression:               - Abnormal esophagus. Query pill induced injury.                            Changes post esophageal dilation suspicious for                            eosinophilic esophagitis - status post biopsy.                           -Diffusely abnormal gastric mucosa. Status post                             gastric biopsy. Hemostasis clips x 2 normal                            duodenal bulb and second portion of the duodenum.                           -Normal-appearing D1 and D2. Moderate Sedation:      Moderate (conscious) sedation was personally administered by an       anesthesia professional. The following parameters were monitored: oxygen       saturation, heart rate, blood pressure, respiratory rate, EKG, adequacy       of pulmonary ventilation, and response to care. Recommendation:           - Return patient to hospital ward for ongoing care.                           - Full liquid diet. Follow-up on pathology. No                            future MRI until clips gone. Review colonoscopy                            report from [place] when it becomes available.                           -Further recommendations to follow. At patient                            request, I called Meghan Terry, daughter,                            [phone], reviewed my findings and                            recommendations. Questions answered. Procedure Code(s):        --- Professional ---                           [phone], Esophagogastroduodenoscopy, flexible,                            transoral; diagnostic, including collection of  specimen(s) by brushing or washing, when performed                            (separate procedure) Diagnosis Code(s):        --- Professional ---                           D50.9, Iron deficiency anemia, unspecified                           R13.10, Dysphagia, unspecified                           R63.4, Abnormal weight loss CPT copyright 2022 American [employer] Association. All rights reserved. The codes documented in this report are preliminary and upon coder review may  be revised to meet current compliance requirements. Meghan Terry. Meghan Schellenberg, MD Meghan Richards, MD 12/23/2022 12:20:17 PM This report has been signed electronically. Number of  Addenda: 0

## 2022-12-23 NOTE — Progress Notes (Signed)
Patient transferred to EGD via w/c.

## 2022-12-23 NOTE — Anesthesia Postprocedure Evaluation (Signed)
Anesthesia Post Note  Patient: Meghan Terry  Procedure(s) Performed: ESOPHAGOGASTRODUODENOSCOPY (EGD) WITH PROPOFOL ESOPHAGEAL DILATION BIOPSY  Patient location during evaluation: Phase II Anesthesia Type: General Level of consciousness: awake and alert and oriented Pain management: pain level controlled Vital Signs Assessment: post-procedure vital signs reviewed and stable Respiratory status: spontaneous breathing, nonlabored ventilation and respiratory function stable Cardiovascular status: blood pressure returned to baseline and stable Postop Assessment: no apparent nausea or vomiting Anesthetic complications: no  No notable events documented.   Last Vitals:  Vitals:   12/23/22 1330 12/23/22 1358  BP: (!) 192/82 (!) 108/49  Pulse: 84 79  Resp: 16 18  Temp:  36.9 C  SpO2: 100% 100%    Last Pain:  Vitals:   12/23/22 1153  TempSrc:   PainSc: 0-No pain                 Angle Karel C Aimee Heldman

## 2022-12-23 NOTE — Anesthesia Preprocedure Evaluation (Signed)
Anesthesia Evaluation  Patient identified by MRN, date of birth, ID band Patient awake    Reviewed: Allergy & Precautions, H&P , NPO status , Patient's Chart, lab work & pertinent test results  History of Anesthesia Complications Negative for: history of anesthetic complications  Airway Mallampati: II  TM Distance: >3 FB Neck ROM: Full    Dental no notable dental hx. (+) Edentulous Upper, Edentulous Lower, Dental Advisory Given   Pulmonary shortness of breath and with exertion, Patient abstained from smoking., former smoker   Pulmonary exam normal breath sounds clear to auscultation       Cardiovascular Exercise Tolerance: Good hypertension, Pt. on medications + angina  + CAD, + Cardiac Stents, + CABG, +CHF and + DOE  Normal cardiovascular exam Rhythm:Regular Rate:Normal     Neuro/Psych  Headaches PSYCHIATRIC DISORDERS Anxiety Depression       GI/Hepatic negative GI ROS, Neg liver ROS,,,  Endo/Other  diabetes (last dose of trulicity on [id number]), Well Controlled, Type 2, Oral Hypoglycemic AgentsHypothyroidism    Renal/GU Renal InsufficiencyRenal disease  negative genitourinary   Musculoskeletal negative musculoskeletal ROS (+)    Abdominal   Peds negative pediatric ROS (+)  Hematology  (+) Blood dyscrasia, anemia   Anesthesia Other Findings   Reproductive/Obstetrics negative OB ROS                             Anesthesia Physical Anesthesia Plan  ASA: 3 and emergent  Anesthesia Plan: General   Post-op Pain Management: Minimal or no pain anticipated   Induction: Intravenous  PONV Risk Score and Plan: 1 and Propofol infusion  Airway Management Planned: Nasal Cannula, Natural Airway and Oral ETT  Additional Equipment:   Intra-op Plan:   Post-operative Plan: Possible Post-op intubation/ventilation  Informed Consent: I have reviewed the patients History and Physical, chart, labs  and discussed the procedure including the risks, benefits and alternatives for the proposed anesthesia with the patient or authorized representative who has indicated his/her understanding and acceptance.     Dental advisory given  Plan Discussed with: CRNA and Surgeon  Anesthesia Plan Comments: (Patient passed black tarry stools this morning, came with hemoglobin 5.5, took Trulicity on last Friday, as per patient she was on liquids yesterday, will proceed with caution, possible GA with ETT was explained.)        Anesthesia Quick Evaluation

## 2022-12-23 NOTE — Transfer of Care (Addendum)
Immediate Anesthesia Transfer of Care Note  Patient: Meghan Terry  Procedure(s) Performed: ESOPHAGOGASTRODUODENOSCOPY (EGD) WITH PROPOFOL ESOPHAGEAL DILATION BIOPSY  Patient Location: PACU  Anesthesia Type:General  Level of Consciousness: awake  Airway & Oxygen Therapy: Patient Spontanous Breathing  Post-op Assessment: Report given to RN  Post vital signs: Reviewed  Last Vitals:  Vitals Value Taken Time  BP    Temp    Pulse 74 12/23/22 1155  Resp 16 12/23/22 1155  SpO2 98 % 12/23/22 1155  Vitals shown include unvalidated device data.  Last Pain:  Vitals:   12/23/22 0946  TempSrc: Oral  PainSc: 0-No pain      Patients Stated Pain Goal: 5 ([id number])  Complications: No notable events documented.

## 2022-12-23 NOTE — Interval H&P Note (Signed)
History and Physical Interval Note:  12/23/2022 11:11 AM  Meghan Terry  has presented today for surgery, with the diagnosis of Symptomatic iron deficiency anemia, dysphagia.  The various methods of treatment have been discussed with the patient and family. After consideration of risks, benefits and other options for treatment, the patient has consented to  Procedure(s): ESOPHAGOGASTRODUODENOSCOPY (EGD) WITH PROPOFOL (N/A) ESOPHAGEAL DILATION (N/A) as a surgical intervention.  The patient's history has been reviewed, patient examined, no change in status, stable for surgery.  I have reviewed the patient's chart and labs.  Questions were answered to the patient's satisfaction.     Nelson Noone   stable overnight.  Hemoglobin 8.8 this morning.  Marked iron deficiency.  Patient endorses early satiety, lack of appetite and esophageal dysphagia.  Discussed with patient and daughter at bedside.  Agree with the EGD with esophageal dilation as feasible/appropriate per plan.  The risks, benefits, limitations, alternatives and imponderables have been reviewed with the patient. Potential for esophageal dilation, biopsy, etc. have also been reviewed.  Questions have been answered. All parties agreeable.   Recently on Plavix.  Further recommendations to follow.

## 2022-12-23 NOTE — Progress Notes (Signed)
Patient returned from EGD with c/o soreness to throat area, declined to take oral medications, but stated she will try and take after her soreness decreases. Advised patient to try a warm salt water gargle but patient declined. Patient resting quietly in bed with call bell in reach watching TV.

## 2022-12-23 NOTE — Progress Notes (Signed)
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  The patient states that every time she stands up from a recumbent position she has dizziness and has near syncopal episodes.  She does have some chest discomfort and increasing dyspnea on exertion.  She states that her shortness of breath has gradually worsened with minimal exertion.  She denies any fevers, chills, cough, hemoptysis, nausea, vomiting, diarrhea, hematochezia, melena.  There is no hematuria.  The patient states that she did have a few episodes of epistaxis on 12/19/2022.  However, she has had episodes of presyncope and dizziness even prior to that.  She denies any new medications.  She states she had a colonoscopy about 2 years ago in [facility].  Apparently was normal.  She continues on aspirin and Plavix for coronary artery disease. In the ED, the patient was afebrile and hemodynamically stable with oxygen saturation 100% room air.  WBC 7.0, hemoglobin 5.5, platelets 320,000.  Sodium 132, potassium 3.2, bicarbonate 19, serum creatinine 2.90.  LFTs were unremarkable.  CT brain was negative for any acute findings.  CT cervical spine was negative for fracture or subluxation.  Chest x-ray was negative for any infiltrates or edema.  2 units PRBC were ordered.  FOBT was negative.   Assessment/Plan: Symptomatic anemia -Follow up iron studies -Transfused 2 units PRBC - Hg up to >8 --iron saturation 4, ferritin 5 -GI consult appreciated>>EGD 3/27 -Baseline hemoglobin  ~9 -Presented with hemoglobin 5.5 -folate 5.5>>replete -B12--277   CKD stage IV -Baseline creatinine 2.8-3.1 -Monitor BMP  Pyuria -UA>50 WBC -urine culture was not sent despite this>>will reorder   Coronary artery disease -Status post CABG times 11/2018 -troponin 5>>5 -Holding aspirin and Plavix temporarily -Continue metoprolol succinate   Chronic diastolic CHF -Clinically euvolemic -04/30/2022 echo EF 60-65%, no WMA, trivial MR, mild decrease RVF -Holding furosemide temporarily   Microcytic anemia/Iron deficiency -iron saturation 4, ferritin 5   Hyperlipidemia -Continue Crestor   Anxiety/depression -Continue home dose of Klonopin and Wellbutrin   Essential hypertension -Continue metoprolol succinate   Diabetes mellitus type 2, controlled -04/30/2022 hemoglobin A1c 7.0 -Repeat hemoglobin [id number] -Holding Trulicity and Farxiga temporarily -NovoLog sliding scale   Hypothyroidism -Continue Synthroid   Restless leg syndrome -Continue Requip    Family Communication:  no Family at bedside  Consultants:  GI  Code Status:  FULL  DVT Prophylaxis:  SCDs   Procedures: As Listed in Progress Note Above  Antibiotics: None   Subjective: No emesis or abdominal pain; seen prior to EGD   Objective: Vitals:   12/23/22 1230 12/23/22 1300 12/23/22 1330 12/23/22 1358  BP:   (!) 192/82 (!) 108/49  Pulse: 80  84 79  Resp: 14 14 16 18   Temp:    98.4 F (36.9 C)  TempSrc:      SpO2: 100%  100% 100%  Weight:      Height:        Intake/Output Summary (Last 24 hours) at 12/23/2022 1759 Last data filed at 12/23/2022 1200 Gross per 24 hour  Intake 440 ml  Output 0 ml  Net 440 ml   Weight change:  Exam:  General:  Pt is alert, follows commands appropriately, not in acute distress HEENT: No icterus, No thrush, No neck mass, [place]/AT Cardiovascular: normal S1/S2, no rubs, no gallops Respiratory: CTA bilaterally, no wheezing, no crackles, no rhonchi Abdomen: Soft/+BS,  non tender, non distended, no guarding Extremities: No edema, No lymphangitis, No petechiae, No rashes, no synovitis  Data Reviewed: I have personally reviewed following labs and imaging studies Basic Metabolic Panel: Recent Labs  Lab 12/21/22 1313 12/22/22 0417 12/23/22 0422  NA 132* 137 136  K 3.2* 4.0 3.7  CL 106 111 112*  CO2 19* 20* 19*  GLUCOSE 109* 78 86  BUN 29* 25* 19  CREATININE 2.90* 2.77* 2.45*  CALCIUM 8.5* 8.6* 8.6*  MG 2.3  --   --    Liver Function Tests: Recent Labs  Lab 12/21/22 1313  AST 15  ALT 14  ALKPHOS 67  BILITOT 0.4  PROT 6.0*  ALBUMIN 3.6   Recent Labs  Lab 12/21/22 1313  LIPASE 37   No results for input(s): "AMMONIA" in the last 168 hours. Coagulation Profile: No results for input(s): "INR", "PROTIME" in the last 168 hours. CBC: Recent Labs  Lab 12/21/22 1313 12/22/22 0417 12/22/22 1300 12/23/22 0422  WBC 7.0 6.3  --  8.1  HGB 5.5* 5.1* 8.1* 8.8*  HCT 18.6* 18.0* 26.9* 29.9*  MCV 70.7* 73.5*  --  76.5*  PLT 328 281  --  311   Cardiac Enzymes: No results for input(s): "CKTOTAL", "CKMB", "CKMBINDEX", "TROPONINI" in the last 168 hours. BNP: Invalid input(s): "POCBNP" CBG: Recent Labs  Lab 12/23/22 0738 12/23/22 0950 12/23/22 1203 12/23/22 1229 12/23/22 1607  GLUCAP 79 67* 73 62* 86   HbA1C: Recent Labs    12/21/22 1804  HGBA1C 5.7*   Urine analysis:    Component Value Date/Time   COLORURINE YELLOW 12/21/2022 1740   APPEARANCEUR CLOUDY (A) 12/21/2022 1740   LABSPEC 1.005 12/21/2022 1740   PHURINE 6.0 12/21/2022 1740   GLUCOSEU >=500 (A) 12/21/2022 1740   HGBUR MODERATE (A) 12/21/2022 1740   BILIRUBINUR NEGATIVE 12/21/2022 1740   KETONESUR NEGATIVE 12/21/2022 1740   PROTEINUR 30 (A) 12/21/2022 1740   NITRITE POSITIVE (A) 12/21/2022 1740   LEUKOCYTESUR LARGE (A) 12/21/2022 1740   Sepsis Labs: @LABRCNTIP (procalcitonin:4,lacticidven:4) )No results found for this or any previous visit (from the past 240  hour(s)).   Scheduled Meds:  sodium chloride   Intravenous Once   buPROPion  300 mg Oral Daily   cholecalciferol  1,000 Units Oral Daily   clonazePAM  0.5 mg Oral QHS   folic acid  1 mg Oral Daily   insulin aspart  0-5 Units Subcutaneous QHS   insulin aspart  0-9 Units Subcutaneous TID WC   levothyroxine  75 mcg Oral Daily   linaclotide  290 mcg Oral QAC breakfast   metoprolol succinate  25 mg Oral QHS   potassium chloride SA  20 mEq Oral Daily   ranolazine  500 mg Oral BID   rOPINIRole  1 mg Oral TID WC   rOPINIRole  4 mg Oral QHS   rosuvastatin  40 mg Oral Daily   topiramate  100 mg Oral BID   traZODone  150 mg Oral QHS   Continuous Infusions:  Procedures/Studies: ECHOCARDIOGRAM COMPLETE  Result Date: 12/22/2022    ECHOCARDIOGRAM REPORT   Patient Name:   [name] Date of Exam: 12/22/2022 Medical Rec #:  [id number]  Height:       63.0 in Accession #:    [id number]   Weight:       131.8 lb Date of Birth:  1961/05/23     BSA:          1.620 m Patient Age:    3 years     BP:           93/39 mmHg Patient Gender: F            HR:           81 bpm. Exam Location:  [facility] Procedure: 2D Echo, Cardiac Doppler and Color Doppler Indications:    Chest Pain R07.9  History:        Patient has prior history of Echocardiogram examinations, most                 recent 04/30/2022. CHF, CAD, Prior CABG, Signs/Symptoms:Chest                 Pain; Risk Factors:Former Smoker, Hypertension and Diabetes.  Sonographer:    Greer Pickerel Referring Phys: [phone] DAVID TAT  Sonographer Comments: Image acquisition challenging due to patient body habitus and Image acquisition challenging due to respiratory motion. IMPRESSIONS  1. Left ventricular ejection fraction, by estimation, is 60 to 65%. The left ventricle has normal function. The left ventricle has no regional wall motion abnormalities. Left ventricular diastolic parameters are consistent with Grade II diastolic dysfunction (pseudonormalization).  2. Right  ventricular systolic function is normal. The right ventricular size is normal. There is normal pulmonary artery systolic pressure. The estimated right ventricular systolic pressure is Q000111Q mmHg.  3. Left atrial size was mildly dilated.  4. The mitral valve is grossly normal. Mild mitral valve regurgitation.  5. The aortic valve is tricuspid. There is mild calcification of the aortic valve. Aortic valve regurgitation is not visualized. Aortic valve sclerosis is present, with no evidence of aortic valve stenosis.  6. The inferior vena cava is normal in size with greater than 50% respiratory variability, suggesting right atrial pressure of 3 mmHg. Comparison(s): Prior images reviewed side by side. LVEF remains normal range at 60-65%. RV contraction is normal. FINDINGS  Left Ventricle: Left ventricular ejection fraction, by estimation, is 60 to 65%. The left ventricle has normal function. The left ventricle has no regional wall motion abnormalities. The left ventricular internal cavity size was normal in size. There is  no left ventricular hypertrophy. Left ventricular diastolic parameters are consistent with Grade II diastolic dysfunction (pseudonormalization). Right Ventricle: The right ventricular size is normal. No increase in right ventricular wall thickness. Right ventricular systolic function is normal. There is normal pulmonary artery systolic pressure. The tricuspid regurgitant velocity is 2.00 m/s, and  with an assumed right atrial pressure of 3 mmHg, the estimated right ventricular systolic pressure is Q000111Q mmHg. Left Atrium: Left atrial size was mildly dilated. Right Atrium: Right atrial size was normal in size. Pericardium: There is no evidence of pericardial effusion. Mitral Valve: The mitral valve is grossly normal. Mild mitral valve regurgitation. Tricuspid Valve: The tricuspid valve is grossly normal. Tricuspid valve regurgitation is trivial. Aortic Valve: The aortic valve is tricuspid. There is mild  calcification of the aortic valve. Aortic valve regurgitation is not visualized. Aortic valve sclerosis is present, with no evidence of aortic valve stenosis. Pulmonic Valve: The pulmonic valve was grossly normal. Pulmonic valve regurgitation is not visualized. Aorta: The aortic root is normal in size and structure. Venous: The inferior  vena cava is normal in size with greater than 50% respiratory variability, suggesting right atrial pressure of 3 mmHg. IAS/Shunts: No atrial level shunt detected by color flow Doppler.  LEFT VENTRICLE PLAX 2D LVIDd:         4.00 cm   Diastology LVIDs:         2.60 cm   LV e' medial:    5.66 cm/s LV PW:         0.90 cm   LV E/e' medial:  18.2 LV IVS:        0.80 cm   LV e' lateral:   8.59 cm/s LVOT diam:     1.90 cm   LV E/e' lateral: 12.0 LV SV:         56 LV SV Index:   35 LVOT Area:     2.84 cm  RIGHT VENTRICLE RV S prime:     7.42 cm/s TAPSE (M-mode): 1.1 cm LEFT ATRIUM             Index        RIGHT ATRIUM           Index LA diam:        3.30 cm 2.04 cm/m   RA Area:     14.90 cm LA Vol (A2C):   53.9 ml 33.28 ml/m  RA Volume:   36.10 ml  22.29 ml/m LA Vol (A4C):   64.0 ml 39.51 ml/m LA Biplane Vol: 60.6 ml 37.41 ml/m  AORTIC VALVE LVOT Vmax:   85.30 cm/s LVOT Vmean:  58.500 cm/s LVOT VTI:    0.198 m  AORTA Ao Root diam: 3.40 cm Ao Asc diam:  3.10 cm MITRAL VALVE                TRICUSPID VALVE MV Area (PHT): 4.29 cm     TR Peak grad:   16.0 mmHg MV Decel Time: 177 msec     TR Vmax:        200.00 cm/s MR Peak grad: 65.9 mmHg MR Vmax:      406.00 cm/s   SHUNTS MV E velocity: 103.00 cm/s  Systemic VTI:  0.20 m MV A velocity: 83.70 cm/s   Systemic Diam: 1.90 cm MV E/A ratio:  1.23 Rozann Lesches MD Electronically signed by Rozann Lesches MD Signature Date/Time: 12/22/2022/10:56:29 AM    Final    CT Head Wo Contrast  Result Date: 12/21/2022 CLINICAL DATA:  Syncope, multiple falls EXAM: CT HEAD WITHOUT CONTRAST CT CERVICAL SPINE WITHOUT CONTRAST TECHNIQUE: Multidetector CT  imaging of the head and cervical spine was performed following the standard protocol without intravenous contrast. Multiplanar CT image reconstructions of the cervical spine were also generated. RADIATION DOSE REDUCTION: This exam was performed according to the departmental dose-optimization program which includes automated exposure control, adjustment of the mA and/or kV according to patient size and/or use of iterative reconstruction technique. COMPARISON:  04/29/2022 FINDINGS: CT HEAD FINDINGS Brain: No evidence of acute infarction, hemorrhage, hydrocephalus, extra-axial collection or mass lesion/mass effect. Mild periventricular white matter hypodensity. Vascular: No hyperdense vessel or unexpected calcification. Skull: Normal. Negative for fracture or focal lesion. Sinuses/Orbits: No acute finding. Other: None. CT CERVICAL SPINE FINDINGS Alignment: Normal. Skull base and vertebrae: No acute fracture. No primary bone lesion or focal pathologic process. Soft tissues and spinal canal: No prevertebral fluid or swelling. No visible canal hematoma. Disc levels: Generally mild multilevel disc space height loss and osteophytosis, focally moderate at C5-C6. Upper chest: Negative. Other: None.  IMPRESSION: 1. No acute intracranial pathology. Small-vessel white matter disease. 2. No fracture or static subluxation of the cervical spine. 3. Generally mild multilevel disc space height loss and osteophytosis, focally moderate at C5-C6. Electronically Signed   By: Delanna Ahmadi M.D.   On: 12/21/2022 14:28   CT Cervical Spine Wo Contrast  Result Date: 12/21/2022 CLINICAL DATA:  Syncope, multiple falls EXAM: CT HEAD WITHOUT CONTRAST CT CERVICAL SPINE WITHOUT CONTRAST TECHNIQUE: Multidetector CT imaging of the head and cervical spine was performed following the standard protocol without intravenous contrast. Multiplanar CT image reconstructions of the cervical spine were also generated. RADIATION DOSE REDUCTION: This exam was  performed according to the departmental dose-optimization program which includes automated exposure control, adjustment of the mA and/or kV according to patient size and/or use of iterative reconstruction technique. COMPARISON:  04/29/2022 FINDINGS: CT HEAD FINDINGS Brain: No evidence of acute infarction, hemorrhage, hydrocephalus, extra-axial collection or mass lesion/mass effect. Mild periventricular white matter hypodensity. Vascular: No hyperdense vessel or unexpected calcification. Skull: Normal. Negative for fracture or focal lesion. Sinuses/Orbits: No acute finding. Other: None. CT CERVICAL SPINE FINDINGS Alignment: Normal. Skull base and vertebrae: No acute fracture. No primary bone lesion or focal pathologic process. Soft tissues and spinal canal: No prevertebral fluid or swelling. No visible canal hematoma. Disc levels: Generally mild multilevel disc space height loss and osteophytosis, focally moderate at C5-C6. Upper chest: Negative. Other: None. IMPRESSION: 1. No acute intracranial pathology. Small-vessel white matter disease. 2. No fracture or static subluxation of the cervical spine. 3. Generally mild multilevel disc space height loss and osteophytosis, focally moderate at C5-C6. Electronically Signed   By: Delanna Ahmadi M.D.   On: 12/21/2022 14:28   CT CHEST ABDOMEN PELVIS WO CONTRAST  Result Date: 12/21/2022 CLINICAL DATA:  Multiple falls, chest pain EXAM: CT CHEST, ABDOMEN AND PELVIS WITHOUT CONTRAST TECHNIQUE: Multidetector CT imaging of the chest, abdomen and pelvis was performed following the standard protocol without IV contrast. RADIATION DOSE REDUCTION: This exam was performed according to the departmental dose-optimization program which includes automated exposure control, adjustment of the mA and/or kV according to patient size and/or use of iterative reconstruction technique. COMPARISON:  CT chest, 06/16/2022 FINDINGS: CT CHEST FINDINGS Cardiovascular: Aortic atherosclerosis. Normal  heart size. Extensive three-vessel coronary artery calcifications and or stents status post median sternotomy and CABG. Mediastinum/Nodes: No enlarged mediastinal, hilar, or axillary lymph nodes. Thyroid gland, trachea, and esophagus demonstrate no significant findings. Lungs/Pleura: Mild pulmonary fibrosis in a pattern with apical to basal gradient featuring irregular peripheral interstitial opacity and ground-glass, with some evidence of subpleural sparing. No pleural effusion or pneumothorax. Musculoskeletal: No chest wall abnormality. No acute osseous findings. CT ABDOMEN PELVIS FINDINGS Hepatobiliary: No focal liver abnormality is seen. Status post cholecystectomy. No biliary dilatation. Pancreas: Unremarkable. No pancreatic ductal dilatation or surrounding inflammatory changes. Spleen: Normal in size without significant abnormality. Adrenals/Urinary Tract: Adrenal glands are unremarkable. Kidneys are normal, without renal calculi, solid lesion, or hydronephrosis. Bladder is unremarkable. Stomach/Bowel: Stomach is within normal limits. Appendix is not clearly visualized. No evidence of bowel wall thickening, distention, or inflammatory changes. Moderate burden of stool and stool balls throughout the colon and rectum. Vascular/Lymphatic: Aortic atherosclerosis. No enlarged abdominal or pelvic lymph nodes. Reproductive: Status post hysterectomy. Other: No abdominal wall hernia or abnormality. No ascites. Musculoskeletal: No acute osseous findings. IMPRESSION: 1. No noncontrast CT evidence of acute traumatic injury to the chest, abdomen, or pelvis. 2. Mild pulmonary fibrosis in a pattern with apical to basal gradient featuring  irregular peripheral interstitial opacity and ground-glass, with some evidence of subpleural sparing. This appearance suggests NSIP or chronic sequelae of prior COVID airspace disease and may be further assessed by pulmonary referral and dedicated ILD protocol CT of the chest on a  nonemergent, outpatient basis if desired. 3. Coronary artery disease. 4. Status post cholecystectomy, hysterectomy, and CABG. 5. Moderate burden of stool and stool balls throughout the colon and rectum. Aortic Atherosclerosis (ICD10-I70.0). Electronically Signed   By: Delanna Ahmadi M.D.   On: 12/21/2022 14:25   DG Chest Portable 1 View  Result Date: 12/21/2022 CLINICAL DATA:  Dizziness with several falls and central chest pain EXAM: PORTABLE CHEST 1 VIEW COMPARISON:  Chest radiograph dated 05/02/2022 FINDINGS: Normal lung volumes. Similar peripheral reticulations. No pleural effusion or pneumothorax. Similar postsurgical cardiomediastinal silhouette. Median sternotomy wires are nondisplaced. No radiographic finding of acute displaced fracture. Right upper quadrant surgical clips. IMPRESSION: 1. No active disease. 2.  No radiographic finding of acute displaced fracture. Electronically Signed   By: Darrin Nipper M.D.   On: 12/21/2022 13:38    Wally Behan Wynetta Emery, MD  How to contact the [facility] Attending or Consulting provider [place] or covering provider during after hours [place], for this patient?  Check the care team in [facility] and look for a) attending/consulting [facility] provider listed and b) the [facility] team listed Log into [email or web address] and use [employer] universal password to access. If you do not have the password, please contact the hospital operator. Locate the [facility] provider you are looking for under [facility] and page to a number that you can be directly reached. If you still have difficulty reaching the provider, please page the [facility] (Director on Call) for the Hospitalists listed on amion for assistance.  12/23/2022, 5:59 PM   LOS: 0 days

## 2022-12-24 ENCOUNTER — Other Ambulatory Visit (HOSPITAL_BASED_OUTPATIENT_CLINIC_OR_DEPARTMENT_OTHER): Payer: Self-pay

## 2022-12-24 DIAGNOSIS — K209 Esophagitis, unspecified without bleeding: Secondary | ICD-10-CM | POA: Diagnosis not present

## 2022-12-24 DIAGNOSIS — I10 Essential (primary) hypertension: Secondary | ICD-10-CM | POA: Diagnosis not present

## 2022-12-24 DIAGNOSIS — G2581 Restless legs syndrome: Secondary | ICD-10-CM | POA: Diagnosis not present

## 2022-12-24 DIAGNOSIS — D649 Anemia, unspecified: Secondary | ICD-10-CM | POA: Diagnosis not present

## 2022-12-24 DIAGNOSIS — E876 Hypokalemia: Secondary | ICD-10-CM | POA: Diagnosis not present

## 2022-12-24 LAB — GLUCOSE, CAPILLARY
Glucose-Capillary: 104 mg/dL — ABNORMAL HIGH (ref 70–99)
Glucose-Capillary: 83 mg/dL (ref 70–99)
Glucose-Capillary: 114 mg/dL — ABNORMAL HIGH (ref 70–99)
Glucose-Capillary: 85 mg/dL (ref 70–99)
Glucose-Capillary: 72 mg/dL (ref 70–99)

## 2022-12-24 LAB — HEMOGLOBIN AND HEMATOCRIT, BLOOD
HCT: 31.2 % — ABNORMAL LOW (ref 36.0–46.0)
Hemoglobin: 9.2 g/dL — ABNORMAL LOW (ref 12.0–15.0)

## 2022-12-24 LAB — BASIC METABOLIC PANEL
Anion gap: 4 — ABNORMAL LOW (ref 5–15)
BUN: 16 mg/dL (ref 8–23)
CO2: 19 mmol/L — ABNORMAL LOW (ref 22–32)
Calcium: 8.4 mg/dL — ABNORMAL LOW (ref 8.9–10.3)
Chloride: 113 mmol/L — ABNORMAL HIGH (ref 98–111)
Creatinine, Ser: 2.23 mg/dL — ABNORMAL HIGH (ref 0.44–1.00)
GFR, Estimated: 24 mL/min — ABNORMAL LOW (ref >60)
Glucose, Bld: 99 mg/dL (ref 70–99)
Potassium: 3.5 mmol/L (ref 3.5–5.1)
Sodium: 136 mmol/L (ref 135–145)

## 2022-12-24 LAB — CBC
HCT: 26.5 % — ABNORMAL LOW (ref 36.0–46.0)
Hemoglobin: 7.9 g/dL — ABNORMAL LOW (ref 12.0–15.0)
MCH: 22.7 pg — ABNORMAL LOW (ref 26.0–34.0)
MCHC: 29.8 g/dL — ABNORMAL LOW (ref 30.0–36.0)
MCV: 76.1 fL — ABNORMAL LOW (ref 80.0–100.0)
Platelets: 285 10*3/uL (ref 150–400)
RBC: 3.48 MIL/uL — ABNORMAL LOW (ref 3.87–5.11)
RDW: 20.1 % — ABNORMAL HIGH (ref 11.5–15.5)
WBC: 7.6 10*3/uL (ref 4.0–10.5)
nRBC: 0 % (ref 0.0–0.2)

## 2022-12-24 MED ORDER — PANTOPRAZOLE SODIUM 40 MG PO TBEC
40.0000 mg | DELAYED_RELEASE_TABLET | Freq: Two times a day (BID) | ORAL | Status: DC
  Administered 2022-12-24: 40 mg via ORAL
  Filled 2022-12-24 (×2): qty 1

## 2022-12-24 MED ORDER — POTASSIUM CHLORIDE ER 20 MEQ PO TBCR
20.0000 meq | EXTENDED_RELEASE_TABLET | Freq: Every day | ORAL | 0 refills | Status: DC
  Filled 2022-12-24: qty 30, 30d supply, fill #0

## 2022-12-24 MED ORDER — ROPINIROLE HCL 1 MG PO TABS
1.0000 mg | ORAL_TABLET | Freq: Three times a day (TID) | ORAL | 0 refills | Status: DC
  Filled 2022-12-24: qty 90, 30d supply, fill #0

## 2022-12-24 MED ORDER — DIPHENHYDRAMINE HCL 50 MG/ML IJ SOLN
25.0000 mg | Freq: Once | INTRAMUSCULAR | Status: AC
  Administered 2022-12-24: 25 mg via INTRAVENOUS
  Filled 2022-12-24: qty 1

## 2022-12-24 MED ORDER — CLONAZEPAM 0.5 MG PO TABS
0.5000 mg | ORAL_TABLET | Freq: Three times a day (TID) | ORAL | 0 refills | Status: DC
  Filled 2022-12-24: qty 30, 10d supply, fill #0

## 2022-12-24 MED ORDER — METOPROLOL SUCCINATE ER 25 MG PO TB24
12.5000 mg | ORAL_TABLET | Freq: Every day | ORAL | Status: DC
  Administered 2022-12-24: 12.5 mg via ORAL
  Filled 2022-12-24: qty 1

## 2022-12-24 MED ORDER — PEG 3350-KCL-NA BICARB-NACL 420 G PO SOLR
4000.0000 mL | Freq: Once | ORAL | Status: AC
  Administered 2022-12-24: 4000 mL via ORAL

## 2022-12-24 NOTE — Progress Notes (Signed)
Gastroenterology Progress Note   Referring Provider: No ref. provider found Primary Care Physician:  Patient, [name] Per Primary Gastroenterologist:  Dr. Jenetta Downer  Patient ID: Meghan Terry; [id number]; April 08, 1961   Subjective:    Worries about going home. She has a fear that she is not going to survive this admission. She is tolerating her full liquid diet. No N/V. She worries about falling. She still has dizziness. It does not start off right away. She is slow to go from sitting to standing allowing her body time to adjust. After she starts walking about 15 feet, her legs get weak, she gets dizzy and then tends to fall. She states she has passed out with this. She states it has not been long since she saw her cardiologist in [place]. She states she has had issues with constipation but having better stools on Linzess. No melena, brbpr.   Objective:   Vital signs in last 24 hours: Temp:  [97.8 F (36.6 C)-98.1 F (36.7 C)] 98.1 F (36.7 C) (03/28 0427) Pulse Rate:  [86-88] 88 (03/28 0427) Resp:  [18] 18 (03/27 1930) BP: (79-102)/(44-56) 102/56 (03/28 0427) SpO2:  [99 %-100 %] 99 % (03/28 0427) Last BM Date : 12/23/22 General:   Alert,  Well-developed, well-nourished, pleasant and cooperative in NAD Head:  Normocephalic and atraumatic. Eyes:  Sclera clear, no icterus.  . Abdomen:  Soft, nontender and nondistended.   Normal bowel sounds, without guarding, and without rebound.   Extremities:  Without clubbing, deformity or edema. Neurologic:  Alert and  oriented x4;  grossly normal neurologically. Skin:  Intact without significant lesions or rashes. Psych:  Alert and cooperative. Normal mood and affect.  Intake/Output from previous day: 03/27 0701 - 03/28 0700 In: 920 [P.O.:720; I.V.:200] Out: 0  Intake/Output this shift: Total I/O In: 600 [P.O.:600] Out: -   Lab Results: CBC Recent Labs    12/22/22 0417 12/22/22 1300 12/23/22 0422 12/24/22 0419 12/24/22 1304  WBC  6.3  --  8.1 7.6  --   HGB 5.1*   < > 8.8* 7.9* 9.2*  HCT 18.0*   < > 29.9* 26.5* 31.2*  MCV 73.5*  --  76.5* 76.1*  --   PLT 281  --  311 285  --    < > = values in this interval not displayed.   BMET Recent Labs    12/22/22 0417 12/23/22 0422 12/24/22 0419  NA 137 136 136  K 4.0 3.7 3.5  CL 111 112* 113*  CO2 20* 19* 19*  GLUCOSE 78 86 99  BUN 25* 19 16  CREATININE 2.77* 2.45* 2.23*  CALCIUM 8.6* 8.6* 8.4*   LFTs No results for input(s): "BILITOT", "BILIDIR", "IBILI", "ALKPHOS", "AST", "ALT", "PROT", "ALBUMIN" in the last 72 hours. No results for input(s): "LIPASE" in the last 72 hours. PT/INR No results for input(s): "LABPROT", "INR" in the last 72 hours.       Imaging Studies: ECHOCARDIOGRAM COMPLETE  Result Date: 12/22/2022    ECHOCARDIOGRAM REPORT   Patient Name:   Meghan Terry Date of Exam: 12/22/2022 Medical Rec #:  [id number]    Height:       63.0 in Accession #:    [id number]   Weight:       131.8 lb Date of Birth:  15-Jan-1961     BSA:          1.620 m Patient Age:    62 years     BP:  93/39 mmHg Patient Gender: F            HR:           81 bpm. Exam Location:  [facility] Procedure: 2D Echo, Cardiac Doppler and Color Doppler Indications:    Chest Pain R07.9  History:        Patient has prior history of Echocardiogram examinations, most                 recent 04/30/2022. CHF, CAD, Prior CABG, Signs/Symptoms:Chest                 Pain; Risk Factors:Former Smoker, Hypertension and Diabetes.  Sonographer:    Greer Pickerel Referring Phys: [phone] DAVID TAT  Sonographer Comments: Image acquisition challenging due to patient body habitus and Image acquisition challenging due to respiratory motion. IMPRESSIONS  1. Left ventricular ejection fraction, by estimation, is 60 to 65%. The left ventricle has normal function. The left ventricle has no regional wall motion abnormalities. Left ventricular diastolic parameters are consistent with Grade II diastolic dysfunction  (pseudonormalization).  2. Right ventricular systolic function is normal. The right ventricular size is normal. There is normal pulmonary artery systolic pressure. The estimated right ventricular systolic pressure is Q000111Q mmHg.  3. Left atrial size was mildly dilated.  4. The mitral valve is grossly normal. Mild mitral valve regurgitation.  5. The aortic valve is tricuspid. There is mild calcification of the aortic valve. Aortic valve regurgitation is not visualized. Aortic valve sclerosis is present, with no evidence of aortic valve stenosis.  6. The inferior vena cava is normal in size with greater than 50% respiratory variability, suggesting right atrial pressure of 3 mmHg. Comparison(s): Prior images reviewed side by side. LVEF remains normal range at 60-65%. RV contraction is normal. FINDINGS  Left Ventricle: Left ventricular ejection fraction, by estimation, is 60 to 65%. The left ventricle has normal function. The left ventricle has no regional wall motion abnormalities. The left ventricular internal cavity size was normal in size. There is  no left ventricular hypertrophy. Left ventricular diastolic parameters are consistent with Grade II diastolic dysfunction (pseudonormalization). Right Ventricle: The right ventricular size is normal. No increase in right ventricular wall thickness. Right ventricular systolic function is normal. There is normal pulmonary artery systolic pressure. The tricuspid regurgitant velocity is 2.00 m/s, and  with an assumed right atrial pressure of 3 mmHg, the estimated right ventricular systolic pressure is Q000111Q mmHg. Left Atrium: Left atrial size was mildly dilated. Right Atrium: Right atrial size was normal in size. Pericardium: There is no evidence of pericardial effusion. Mitral Valve: The mitral valve is grossly normal. Mild mitral valve regurgitation. Tricuspid Valve: The tricuspid valve is grossly normal. Tricuspid valve regurgitation is trivial. Aortic Valve: The aortic  valve is tricuspid. There is mild calcification of the aortic valve. Aortic valve regurgitation is not visualized. Aortic valve sclerosis is present, with no evidence of aortic valve stenosis. Pulmonic Valve: The pulmonic valve was grossly normal. Pulmonic valve regurgitation is not visualized. Aorta: The aortic root is normal in size and structure. Venous: The inferior vena cava is normal in size with greater than 50% respiratory variability, suggesting right atrial pressure of 3 mmHg. IAS/Shunts: No atrial level shunt detected by color flow Doppler.  LEFT VENTRICLE PLAX 2D LVIDd:         4.00 cm   Diastology LVIDs:         2.60 cm   LV e' medial:    5.66 cm/s  LV PW:         0.90 cm   LV E/e' medial:  18.2 LV IVS:        0.80 cm   LV e' lateral:   8.59 cm/s LVOT diam:     1.90 cm   LV E/e' lateral: 12.0 LV SV:         56 LV SV Index:   35 LVOT Area:     2.84 cm  RIGHT VENTRICLE RV S prime:     7.42 cm/s TAPSE (M-mode): 1.1 cm LEFT ATRIUM             Index        RIGHT ATRIUM           Index LA diam:        3.30 cm 2.04 cm/m   RA Area:     14.90 cm LA Vol (A2C):   53.9 ml 33.28 ml/m  RA Volume:   36.10 ml  22.29 ml/m LA Vol (A4C):   64.0 ml 39.51 ml/m LA Biplane Vol: 60.6 ml 37.41 ml/m  AORTIC VALVE LVOT Vmax:   85.30 cm/s LVOT Vmean:  58.500 cm/s LVOT VTI:    0.198 m  AORTA Ao Root diam: 3.40 cm Ao Asc diam:  3.10 cm MITRAL VALVE                TRICUSPID VALVE MV Area (PHT): 4.29 cm     TR Peak grad:   16.0 mmHg MV Decel Time: 177 msec     TR Vmax:        200.00 cm/s MR Peak grad: 65.9 mmHg MR Vmax:      406.00 cm/s   SHUNTS MV E velocity: 103.00 cm/s  Systemic VTI:  0.20 m MV A velocity: 83.70 cm/s   Systemic Diam: 1.90 cm MV E/A ratio:  1.23 Rozann Lesches MD Electronically signed by Rozann Lesches MD Signature Date/Time: 12/22/2022/10:56:29 AM    Final    CT Head Wo Contrast  Result Date: 12/21/2022 CLINICAL DATA:  Syncope, multiple falls EXAM: CT HEAD WITHOUT CONTRAST CT CERVICAL SPINE WITHOUT  CONTRAST TECHNIQUE: Multidetector CT imaging of the head and cervical spine was performed following the standard protocol without intravenous contrast. Multiplanar CT image reconstructions of the cervical spine were also generated. RADIATION DOSE REDUCTION: This exam was performed according to the departmental dose-optimization program which includes automated exposure control, adjustment of the mA and/or kV according to patient size and/or use of iterative reconstruction technique. COMPARISON:  04/29/2022 FINDINGS: CT HEAD FINDINGS Brain: No evidence of acute infarction, hemorrhage, hydrocephalus, extra-axial collection or mass lesion/mass effect. Mild periventricular white matter hypodensity. Vascular: No hyperdense vessel or unexpected calcification. Skull: Normal. Negative for fracture or focal lesion. Sinuses/Orbits: No acute finding. Other: None. CT CERVICAL SPINE FINDINGS Alignment: Normal. Skull base and vertebrae: No acute fracture. No primary bone lesion or focal pathologic process. Soft tissues and spinal canal: No prevertebral fluid or swelling. No visible canal hematoma. Disc levels: Generally mild multilevel disc space height loss and osteophytosis, focally moderate at C5-C6. Upper chest: Negative. Other: None. IMPRESSION: 1. No acute intracranial pathology. Small-vessel white matter disease. 2. No fracture or static subluxation of the cervical spine. 3. Generally mild multilevel disc space height loss and osteophytosis, focally moderate at C5-C6. Electronically Signed   By: Delanna Ahmadi M.D.   On: 12/21/2022 14:28   CT Cervical Spine Wo Contrast  Result Date: 12/21/2022 CLINICAL DATA:  Syncope, multiple falls EXAM: CT HEAD WITHOUT  CONTRAST CT CERVICAL SPINE WITHOUT CONTRAST TECHNIQUE: Multidetector CT imaging of the head and cervical spine was performed following the standard protocol without intravenous contrast. Multiplanar CT image reconstructions of the cervical spine were also generated.  RADIATION DOSE REDUCTION: This exam was performed according to the departmental dose-optimization program which includes automated exposure control, adjustment of the mA and/or kV according to patient size and/or use of iterative reconstruction technique. COMPARISON:  04/29/2022 FINDINGS: CT HEAD FINDINGS Brain: No evidence of acute infarction, hemorrhage, hydrocephalus, extra-axial collection or mass lesion/mass effect. Mild periventricular white matter hypodensity. Vascular: No hyperdense vessel or unexpected calcification. Skull: Normal. Negative for fracture or focal lesion. Sinuses/Orbits: No acute finding. Other: None. CT CERVICAL SPINE FINDINGS Alignment: Normal. Skull base and vertebrae: No acute fracture. No primary bone lesion or focal pathologic process. Soft tissues and spinal canal: No prevertebral fluid or swelling. No visible canal hematoma. Disc levels: Generally mild multilevel disc space height loss and osteophytosis, focally moderate at C5-C6. Upper chest: Negative. Other: None. IMPRESSION: 1. No acute intracranial pathology. Small-vessel white matter disease. 2. No fracture or static subluxation of the cervical spine. 3. Generally mild multilevel disc space height loss and osteophytosis, focally moderate at C5-C6. Electronically Signed   By: Delanna Ahmadi M.D.   On: 12/21/2022 14:28   CT CHEST ABDOMEN PELVIS WO CONTRAST  Result Date: 12/21/2022 CLINICAL DATA:  Multiple falls, chest pain EXAM: CT CHEST, ABDOMEN AND PELVIS WITHOUT CONTRAST TECHNIQUE: Multidetector CT imaging of the chest, abdomen and pelvis was performed following the standard protocol without IV contrast. RADIATION DOSE REDUCTION: This exam was performed according to the departmental dose-optimization program which includes automated exposure control, adjustment of the mA and/or kV according to patient size and/or use of iterative reconstruction technique. COMPARISON:  CT chest, 06/16/2022 FINDINGS: CT CHEST FINDINGS  Cardiovascular: Aortic atherosclerosis. Normal heart size. Extensive three-vessel coronary artery calcifications and or stents status post median sternotomy and CABG. Mediastinum/Nodes: No enlarged mediastinal, hilar, or axillary lymph nodes. Thyroid gland, trachea, and esophagus demonstrate no significant findings. Lungs/Pleura: Mild pulmonary fibrosis in a pattern with apical to basal gradient featuring irregular peripheral interstitial opacity and ground-glass, with some evidence of subpleural sparing. No pleural effusion or pneumothorax. Musculoskeletal: No chest wall abnormality. No acute osseous findings. CT ABDOMEN PELVIS FINDINGS Hepatobiliary: No focal liver abnormality is seen. Status post cholecystectomy. No biliary dilatation. Pancreas: Unremarkable. No pancreatic ductal dilatation or surrounding inflammatory changes. Spleen: Normal in size without significant abnormality. Adrenals/Urinary Tract: Adrenal glands are unremarkable. Kidneys are normal, without renal calculi, solid lesion, or hydronephrosis. Bladder is unremarkable. Stomach/Bowel: Stomach is within normal limits. Appendix is not clearly visualized. No evidence of bowel wall thickening, distention, or inflammatory changes. Moderate burden of stool and stool balls throughout the colon and rectum. Vascular/Lymphatic: Aortic atherosclerosis. No enlarged abdominal or pelvic lymph nodes. Reproductive: Status post hysterectomy. Other: No abdominal wall hernia or abnormality. No ascites. Musculoskeletal: No acute osseous findings. IMPRESSION: 1. No noncontrast CT evidence of acute traumatic injury to the chest, abdomen, or pelvis. 2. Mild pulmonary fibrosis in a pattern with apical to basal gradient featuring irregular peripheral interstitial opacity and ground-glass, with some evidence of subpleural sparing. This appearance suggests NSIP or chronic sequelae of prior COVID airspace disease and may be further assessed by pulmonary referral and  dedicated ILD protocol CT of the chest on a nonemergent, outpatient basis if desired. 3. Coronary artery disease. 4. Status post cholecystectomy, hysterectomy, and CABG. 5. Moderate burden of stool and stool balls throughout the  colon and rectum. Aortic Atherosclerosis (ICD10-I70.0). Electronically Signed   By: Delanna Ahmadi M.D.   On: 12/21/2022 14:25   DG Chest Portable 1 View  Result Date: 12/21/2022 CLINICAL DATA:  Dizziness with several falls and central chest pain EXAM: PORTABLE CHEST 1 VIEW COMPARISON:  Chest radiograph dated 05/02/2022 FINDINGS: Normal lung volumes. Similar peripheral reticulations. No pleural effusion or pneumothorax. Similar postsurgical cardiomediastinal silhouette. Median sternotomy wires are nondisplaced. No radiographic finding of acute displaced fracture. Right upper quadrant surgical clips. IMPRESSION: 1. No active disease. 2.  No radiographic finding of acute displaced fracture. Electronically Signed   By: Darrin Nipper M.D.   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  Acute on chronic symptomatic anemia: Recent baseline hemoglobin in the 9-10 range.  Hemoglobin 5.5 on admission, down to 5.1 prior to blood transfusions.  Received 2 units of packed red blood cells with posttransfusion hemoglobin of 8.1.  Hemoglobin trending to 8.8-->7.9-->9.2.  She has had diminished oral intake/appetite with weight loss in the setting of Trulicity for the past 1 and half months.  Complaints of pill dysphagia over the past couple of days.  EGD this admission  with abnormal esophagus, query pill induced injury.  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  Diffusely abnormal gastric mucosa status post biopsy.  Reports colonoscopy last year unremarkable.  Constipation: Outpatient Linzess 145 mcg not adequate.  CT this admission with moderate burden of stool and stool balls throughout the colon and rectum.  Started on increased dosing, 290 mcg daily.  Having results.  Weakness/dizziness/falls: Possibly multifactorial in the setting of iron deficiency anemia, folate deficiency.  Cannot exclude cardiac or neurological etiology.  Mild improvement of symptoms with transfusion.  Plan:   Continue folic acid 1 mg daily. Continue Linzess 290 mcg daily. Recommend oral iron supplementation at discharge. Consider B12 supplement given low normal level. Plans for colonoscopy tomorrow to further evaluate iron deficiency.  I have discussed the risks, alternatives, benefits with regards to but not limited to the risk of reaction to medication, bleeding, infection, perforation and the patient is agreeable to proceed. Written consent to be obtained.    LOS: 1 day   Laureen Ochs. Bernarda Caffey [facility] [phone] 3/28/20242:11 PM

## 2022-12-24 NOTE — Progress Notes (Signed)
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  The patient states that every time she stands up from a recumbent position she has dizziness and has near syncopal episodes.  She does have some chest discomfort and increasing dyspnea on exertion.  She states that her shortness of breath has gradually worsened with minimal exertion.  She denies any fevers, chills, cough, hemoptysis, nausea, vomiting, diarrhea, hematochezia, melena.  There is no hematuria.  The patient states that she did have a few episodes of epistaxis on 12/19/2022.  However, she has had episodes of presyncope and dizziness even prior to that.  She denies any new medications.  She states she had a colonoscopy about 2 years ago in [facility]  Apparently was normal.  She continues on aspirin and Plavix for coronary artery disease. In the ED, the patient was afebrile and hemodynamically stable with oxygen saturation 100% room air.  WBC 7.0, hemoglobin 5.5, platelets 320,000.  Sodium 132, potassium 3.2, bicarbonate 19, serum creatinine 2.90.  LFTs were unremarkable.  CT brain was negative for any acute findings.  CT cervical spine was negative for fracture or subluxation.  Chest x-ray was negative for any infiltrates or edema.  2 units PRBC were ordered.  FOBT was negative.   Assessment/Plan: Symptomatic anemia -Follow up iron studies -Transfused 2 units PRBC - Hg up to >8 --iron saturation 4, ferritin 5 -GI consult appreciated>>EGD 3/27 -Baseline hemoglobin  ~9 -Presented with hemoglobin 5.5 -folate 5.5>>replete -B12--277   CKD stage IV -Baseline creatinine 2.8-3.1 -Monitor BMP  Pyuria -UA>50 WBC -urine culture was not sent despite this>>will reorder   Coronary artery disease -Status post CABG times 11/2018 -troponin 5>>5 -Holding aspirin and Plavix temporarily -Continue metoprolol succinate   Chronic diastolic CHF -Clinically euvolemic -04/30/2022 echo EF 60-65%, no WMA, trivial MR, mild decrease RVF -Holding furosemide temporarily   Microcytic anemia/Iron deficiency -iron saturation 4, ferritin 5   Hyperlipidemia -Continue Crestor   Anxiety/depression -Continue home dose of Klonopin and Wellbutrin   Essential hypertension -Continue metoprolol succinate   Diabetes mellitus type 2, controlled -04/30/2022 hemoglobin A1c 7.0 -Repeat hemoglobin 123456 -Holding Trulicity and Farxiga temporarily -NovoLog sliding scale   Hypothyroidism -Continue Synthroid   Restless leg syndrome -Continue Requip    Family Communication:  no Family at bedside  Consultants:  GI  Code Status:  FULL  DVT Prophylaxis:  SCDs   Procedures: As Listed in Progress Note Above  Antibiotics: None   Subjective: Pt having multiple complaints today, concerned about soft BPs.   Objective: Vitals:   12/24/22 0425 12/24/22 0426 12/24/22 0427 12/24/22 1554  BP: (!) 79/48 (!) 98/52 (!) 102/56 (!) 109/56  Pulse: 88 88 88 88  Resp:    20  Temp: 98.1 F (36.7 C) 98.1 F (36.7 C) 98.1 F (36.7 C) 98.2 F (36.8 C)  TempSrc: Oral Oral Oral Oral  SpO2: 99% 99% 99% 99%  Weight:      Height:        Intake/Output Summary (Last 24 hours) at 12/24/2022 1803 Last data filed  at 12/24/2022 0843 Gross per 24 hour  Intake 1080 ml  Output --  Net 1080 ml   Weight change:  Exam:  General:  Pt is alert, follows commands appropriately, not in acute distress HEENT: No icterus, No thrush, No neck mass, [place]/AT Cardiovascular: normal S1/S2, no rubs, no  gallops Respiratory: CTA bilaterally, no wheezing, no crackles, no rhonchi Abdomen: Soft/+BS, non tender, non distended, no guarding Extremities: No edema, No lymphangitis, No petechiae, No rashes, no synovitis  Data Reviewed: I have personally reviewed following labs and imaging studies Basic Metabolic Panel: Recent Labs  Lab 12/21/22 1313 12/22/22 0417 12/23/22 0422 12/24/22 0419  NA 132* 137 136 136  K 3.2* 4.0 3.7 3.5  CL 106 111 112* 113*  CO2 19* 20* 19* 19*  GLUCOSE 109* 78 86 99  BUN 29* 25* 19 16  CREATININE 2.90* 2.77* 2.45* 2.23*  CALCIUM 8.5* 8.6* 8.6* 8.4*  MG 2.3  --   --   --    Liver Function Tests: Recent Labs  Lab 12/21/22 1313  AST 15  ALT 14  ALKPHOS 67  BILITOT 0.4  PROT 6.0*  ALBUMIN 3.6   Recent Labs  Lab 12/21/22 1313  LIPASE 37   No results for input(s): "AMMONIA" in the last 168 hours. Coagulation Profile: No results for input(s): "INR", "PROTIME" in the last 168 hours. CBC: Recent Labs  Lab 12/21/22 1313 12/22/22 0417 12/22/22 1300 12/23/22 0422 12/24/22 0419 12/24/22 1304  WBC 7.0 6.3  --  8.1 7.6  --   HGB 5.5* 5.1* 8.1* 8.8* 7.9* 9.2*  HCT 18.6* 18.0* 26.9* 29.9* 26.5* 31.2*  MCV 70.7* 73.5*  --  76.5* 76.1*  --   PLT 328 281  --  311 285  --    Cardiac Enzymes: No results for input(s): "CKTOTAL", "CKMB", "CKMBINDEX", "TROPONINI" in the last 168 hours. BNP: Invalid input(s): "POCBNP" CBG: Recent Labs  Lab 12/23/22 2207 12/24/22 0414 12/24/22 0808 12/24/22 1150 12/24/22 1553  GLUCAP 150* 104* 83 114* 85   HbA1C: Recent Labs    12/21/22 1804  HGBA1C 5.7*   Urine analysis:    Component Value Date/Time   COLORURINE YELLOW 12/21/2022 1740   APPEARANCEUR CLOUDY (A) 12/21/2022 1740   LABSPEC 1.005 12/21/2022 1740   PHURINE 6.0 12/21/2022 1740   GLUCOSEU >=500 (A) 12/21/2022 1740   HGBUR MODERATE (A) 12/21/2022 1740   BILIRUBINUR NEGATIVE 12/21/2022 1740   KETONESUR NEGATIVE 12/21/2022 1740   PROTEINUR 30  (A) 12/21/2022 1740   NITRITE POSITIVE (A) 12/21/2022 1740   LEUKOCYTESUR LARGE (A) 12/21/2022 1740   Sepsis Labs: @LABRCNTIP (procalcitonin:4,lacticidven:4) )No results found for this or any previous visit (from the past 240 hour(s)).   Scheduled Meds:  sodium chloride   Intravenous Once   buPROPion  300 mg Oral Daily   cholecalciferol  1,000 Units Oral Daily   clonazePAM  0.5 mg Oral QHS   folic acid  1 mg Oral Daily   insulin aspart  0-5 Units Subcutaneous QHS   insulin aspart  0-9 Units Subcutaneous TID WC   levothyroxine  75 mcg Oral Daily   linaclotide  290 mcg Oral QAC breakfast   metoprolol succinate  12.5 mg Oral QHS   pantoprazole  40 mg Oral BID   potassium chloride SA  20 mEq Oral Daily   ranolazine  500 mg Oral BID   rOPINIRole  1 mg Oral TID WC   rOPINIRole  4 mg Oral QHS   rosuvastatin  40 mg Oral  Daily   topiramate  100 mg Oral BID   traZODone  150 mg Oral QHS   Continuous Infusions:  Procedures/Studies: ECHOCARDIOGRAM COMPLETE  Result Date: 12/22/2022    ECHOCARDIOGRAM REPORT   Patient Name:   Meghan Terry Date of Exam: 12/22/2022 Medical Rec #:  [id number]    Height:       63.0 in Accession #:    [id number]   Weight:       131.8 lb Date of Birth:  [birth year]     BSA:          1.620 m Patient Age:    48 years     BP:           93/39 mmHg Patient Gender: F            HR:           81 bpm. Exam Location:  [facility] Procedure: 2D Echo, Cardiac Doppler and Color Doppler Indications:    Chest Pain R07.9  History:        Patient has prior history of Echocardiogram examinations, most                 recent 04/30/2022. CHF, CAD, Prior CABG, Signs/Symptoms:Chest                 Pain; Risk Factors:Former Smoker, Hypertension and Diabetes.  Sonographer:    Greer Pickerel Referring Phys: [phone] DAVID TAT  Sonographer Comments: Image acquisition challenging due to patient body habitus and Image acquisition challenging due to respiratory motion. IMPRESSIONS  1. Left ventricular ejection  fraction, by estimation, is 60 to 65%. The left ventricle has normal function. The left ventricle has no regional wall motion abnormalities. Left ventricular diastolic parameters are consistent with Grade II diastolic dysfunction (pseudonormalization).  2. Right ventricular systolic function is normal. The right ventricular size is normal. There is normal pulmonary artery systolic pressure. The estimated right ventricular systolic pressure is Q000111Q mmHg.  3. Left atrial size was mildly dilated.  4. The mitral valve is grossly normal. Mild mitral valve regurgitation.  5. The aortic valve is tricuspid. There is mild calcification of the aortic valve. Aortic valve regurgitation is not visualized. Aortic valve sclerosis is present, with no evidence of aortic valve stenosis.  6. The inferior vena cava is normal in size with greater than 50% respiratory variability, suggesting right atrial pressure of 3 mmHg. Comparison(s): Prior images reviewed side by side. LVEF remains normal range at 60-65%. RV contraction is normal. FINDINGS  Left Ventricle: Left ventricular ejection fraction, by estimation, is 60 to 65%. The left ventricle has normal function. The left ventricle has no regional wall motion abnormalities. The left ventricular internal cavity size was normal in size. There is  no left ventricular hypertrophy. Left ventricular diastolic parameters are consistent with Grade II diastolic dysfunction (pseudonormalization). Right Ventricle: The right ventricular size is normal. No increase in right ventricular wall thickness. Right ventricular systolic function is normal. There is normal pulmonary artery systolic pressure. The tricuspid regurgitant velocity is 2.00 m/s, and  with an assumed right atrial pressure of 3 mmHg, the estimated right ventricular systolic pressure is Q000111Q mmHg. Left Atrium: Left atrial size was mildly dilated. Right Atrium: Right atrial size was normal in size. Pericardium: There is no evidence of  pericardial effusion. Mitral Valve: The mitral valve is grossly normal. Mild mitral valve regurgitation. Tricuspid Valve: The tricuspid valve is grossly normal. Tricuspid valve regurgitation is trivial. Aortic Valve: The aortic valve  is tricuspid. There is mild calcification of the aortic valve. Aortic valve regurgitation is not visualized. Aortic valve sclerosis is present, with no evidence of aortic valve stenosis. Pulmonic Valve: The pulmonic valve was grossly normal. Pulmonic valve regurgitation is not visualized. Aorta: The aortic root is normal in size and structure. Venous: The inferior vena cava is normal in size with greater than 50% respiratory variability, suggesting right atrial pressure of 3 mmHg. IAS/Shunts: No atrial level shunt detected by color flow Doppler.  LEFT VENTRICLE PLAX 2D LVIDd:         4.00 cm   Diastology LVIDs:         2.60 cm   LV e' medial:    5.66 cm/s LV PW:         0.90 cm   LV E/e' medial:  18.2 LV IVS:        0.80 cm   LV e' lateral:   8.59 cm/s LVOT diam:     1.90 cm   LV E/e' lateral: 12.0 LV SV:         56 LV SV Index:   35 LVOT Area:     2.84 cm  RIGHT VENTRICLE RV S prime:     7.42 cm/s TAPSE (M-mode): 1.1 cm LEFT ATRIUM             Index        RIGHT ATRIUM           Index LA diam:        3.30 cm 2.04 cm/m   RA Area:     14.90 cm LA Vol (A2C):   53.9 ml 33.28 ml/m  RA Volume:   36.10 ml  22.29 ml/m LA Vol (A4C):   64.0 ml 39.51 ml/m LA Biplane Vol: 60.6 ml 37.41 ml/m  AORTIC VALVE LVOT Vmax:   85.30 cm/s LVOT Vmean:  58.500 cm/s LVOT VTI:    0.198 m  AORTA Ao Root diam: 3.40 cm Ao Asc diam:  3.10 cm MITRAL VALVE                TRICUSPID VALVE MV Area (PHT): 4.29 cm     TR Peak grad:   16.0 mmHg MV Decel Time: 177 msec     TR Vmax:        200.00 cm/s MR Peak grad: 65.9 mmHg MR Vmax:      406.00 cm/s   SHUNTS MV E velocity: 103.00 cm/s  Systemic VTI:  0.20 m MV A velocity: 83.70 cm/s   Systemic Diam: 1.90 cm MV E/A ratio:  1.23 Rozann Lesches MD Electronically signed  by Rozann Lesches MD Signature Date/Time: 12/22/2022/10:56:29 AM    Final    CT Head Wo Contrast  Result Date: 12/21/2022 CLINICAL DATA:  Syncope, multiple falls EXAM: CT HEAD WITHOUT CONTRAST CT CERVICAL SPINE WITHOUT CONTRAST TECHNIQUE: Multidetector CT imaging of the head and cervical spine was performed following the standard protocol without intravenous contrast. Multiplanar CT image reconstructions of the cervical spine were also generated. RADIATION DOSE REDUCTION: This exam was performed according to the departmental dose-optimization program which includes automated exposure control, adjustment of the mA and/or kV according to patient size and/or use of iterative reconstruction technique. COMPARISON:  04/29/2022 FINDINGS: CT HEAD FINDINGS Brain: No evidence of acute infarction, hemorrhage, hydrocephalus, extra-axial collection or mass lesion/mass effect. Mild periventricular white matter hypodensity. Vascular: No hyperdense vessel or unexpected calcification. Skull: Normal. Negative for fracture or focal lesion. Sinuses/Orbits: No acute finding. Other: None.  CT CERVICAL SPINE FINDINGS Alignment: Normal. Skull base and vertebrae: No acute fracture. No primary bone lesion or focal pathologic process. Soft tissues and spinal canal: No prevertebral fluid or swelling. No visible canal hematoma. Disc levels: Generally mild multilevel disc space height loss and osteophytosis, focally moderate at C5-C6. Upper chest: Negative. Other: None. IMPRESSION: 1. No acute intracranial pathology. Small-vessel white matter disease. 2. No fracture or static subluxation of the cervical spine. 3. Generally mild multilevel disc space height loss and osteophytosis, focally moderate at C5-C6. Electronically Signed   By: Delanna Ahmadi M.D.   On: 12/21/2022 14:28   CT Cervical Spine Wo Contrast  Result Date: 12/21/2022 CLINICAL DATA:  Syncope, multiple falls EXAM: CT HEAD WITHOUT CONTRAST CT CERVICAL SPINE WITHOUT CONTRAST  TECHNIQUE: Multidetector CT imaging of the head and cervical spine was performed following the standard protocol without intravenous contrast. Multiplanar CT image reconstructions of the cervical spine were also generated. RADIATION DOSE REDUCTION: This exam was performed according to the departmental dose-optimization program which includes automated exposure control, adjustment of the mA and/or kV according to patient size and/or use of iterative reconstruction technique. COMPARISON:  04/29/2022 FINDINGS: CT HEAD FINDINGS Brain: No evidence of acute infarction, hemorrhage, hydrocephalus, extra-axial collection or mass lesion/mass effect. Mild periventricular white matter hypodensity. Vascular: No hyperdense vessel or unexpected calcification. Skull: Normal. Negative for fracture or focal lesion. Sinuses/Orbits: No acute finding. Other: None. CT CERVICAL SPINE FINDINGS Alignment: Normal. Skull base and vertebrae: No acute fracture. No primary bone lesion or focal pathologic process. Soft tissues and spinal canal: No prevertebral fluid or swelling. No visible canal hematoma. Disc levels: Generally mild multilevel disc space height loss and osteophytosis, focally moderate at C5-C6. Upper chest: Negative. Other: None. IMPRESSION: 1. No acute intracranial pathology. Small-vessel white matter disease. 2. No fracture or static subluxation of the cervical spine. 3. Generally mild multilevel disc space height loss and osteophytosis, focally moderate at C5-C6. Electronically Signed   By: Delanna Ahmadi M.D.   On: 12/21/2022 14:28   CT CHEST ABDOMEN PELVIS WO CONTRAST  Result Date: 12/21/2022 CLINICAL DATA:  Multiple falls, chest pain EXAM: CT CHEST, ABDOMEN AND PELVIS WITHOUT CONTRAST TECHNIQUE: Multidetector CT imaging of the chest, abdomen and pelvis was performed following the standard protocol without IV contrast. RADIATION DOSE REDUCTION: This exam was performed according to the departmental dose-optimization  program which includes automated exposure control, adjustment of the mA and/or kV according to patient size and/or use of iterative reconstruction technique. COMPARISON:  CT chest, 06/16/2022 FINDINGS: CT CHEST FINDINGS Cardiovascular: Aortic atherosclerosis. Normal heart size. Extensive three-vessel coronary artery calcifications and or stents status post median sternotomy and CABG. Mediastinum/Nodes: No enlarged mediastinal, hilar, or axillary lymph nodes. Thyroid gland, trachea, and esophagus demonstrate no significant findings. Lungs/Pleura: Mild pulmonary fibrosis in a pattern with apical to basal gradient featuring irregular peripheral interstitial opacity and ground-glass, with some evidence of subpleural sparing. No pleural effusion or pneumothorax. Musculoskeletal: No chest wall abnormality. No acute osseous findings. CT ABDOMEN PELVIS FINDINGS Hepatobiliary: No focal liver abnormality is seen. Status post cholecystectomy. No biliary dilatation. Pancreas: Unremarkable. No pancreatic ductal dilatation or surrounding inflammatory changes. Spleen: Normal in size without significant abnormality. Adrenals/Urinary Tract: Adrenal glands are unremarkable. Kidneys are normal, without renal calculi, solid lesion, or hydronephrosis. Bladder is unremarkable. Stomach/Bowel: Stomach is within normal limits. Appendix is not clearly visualized. No evidence of bowel wall thickening, distention, or inflammatory changes. Moderate burden of stool and stool balls throughout the colon and rectum. Vascular/Lymphatic: Aortic  atherosclerosis. No enlarged abdominal or pelvic lymph nodes. Reproductive: Status post hysterectomy. Other: No abdominal wall hernia or abnormality. No ascites. Musculoskeletal: No acute osseous findings. IMPRESSION: 1. No noncontrast CT evidence of acute traumatic injury to the chest, abdomen, or pelvis. 2. Mild pulmonary fibrosis in a pattern with apical to basal gradient featuring irregular peripheral  interstitial opacity and ground-glass, with some evidence of subpleural sparing. 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 3. Coronary artery disease. 4. Status post cholecystectomy, hysterectomy, and CABG. 5. Moderate burden of stool and stool balls throughout the colon and rectum. Aortic Atherosclerosis (ICD10-I70.0). Electronically Signed   By: Delanna Ahmadi M.D.   On: 12/21/2022 14:25   DG Chest Portable 1 View  Result Date: 12/21/2022 CLINICAL DATA:  Dizziness with several falls and central chest pain EXAM: PORTABLE CHEST 1 VIEW COMPARISON:  Chest radiograph dated 05/02/2022 FINDINGS: Normal lung volumes. Similar peripheral reticulations. No pleural effusion or pneumothorax. Similar postsurgical cardiomediastinal silhouette. Median sternotomy wires are nondisplaced. No radiographic finding of acute displaced fracture. Right upper quadrant surgical clips. IMPRESSION: 1. No active disease. 2.  No radiographic finding of acute displaced fracture. Electronically Signed   By: Darrin Nipper M.D.   On: 12/21/2022 13:38    Maurice Ramseur Wynetta Emery, MD  How to contact the [facility] Attending or Consulting provider [place] or covering provider during after hours [place], for this patient?  Check the care team in [facility] and look for a) attending/consulting [facility] provider listed and b) the [facility] team listed Log into [email or web address] and use [place]'s universal password to access. If you do not have the password, please contact the hospital operator. Locate the [facility] provider you are looking for under [facility] and page to a number that you can be directly reached. If you still have difficulty reaching the provider, please page the [facility] (Director on Call) for the Hospitalists listed on amion for assistance.  12/24/2022, 6:03 PM   LOS: 1 day

## 2022-12-24 NOTE — Evaluation (Signed)
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  The patient states that every time she stands up from a recumbent position she has dizziness and has near syncopal episodes.  She does have some chest discomfort and increasing dyspnea on exertion.  She states that her shortness of breath has gradually worsened with minimal exertion.  She denies any fevers, chills, cough, hemoptysis, nausea, vomiting, diarrhea, hematochezia, melena.  There is no hematuria.  The patient states that she did have a few episodes of epistaxis on 12/19/2022.  However, she has had episodes of presyncope and dizziness even prior to that.  She denies any new medications.  She states she had a colonoscopy about 2 years ago in [facility].  Apparently was normal.  She continues on aspirin and Plavix for coronary artery disease.   Clinical Impression  Patient demonstrates good return for sitting up at bedside, transferring to chair and ambulating in hallway without loss of balance.  Patient had mild drifting left/right during ambulation and able to self correct without loss of balance and without need for use of an AD.  Patient tolerated sitting up in chair with her daughter present in room after therapy.  Patient will benefit from continued skilled physical therapy in hospital and recommended venue below to increase strength, balance, endurance for safe ADLs and gait.         Recommendations for follow up therapy are one component of a multi-disciplinary discharge planning process, led by the attending physician.   Recommendations may be updated based on patient status, additional functional criteria and insurance authorization.  Follow Up Recommendations Can patient physically be transported by private vehicle: Yes     Assistance Recommended at Discharge PRN  Patient can return home with the following  A little help with walking and/or transfers;Help with stairs or ramp for entrance;Assistance with cooking/housework;A little help with bathing/dressing/bathroom    Equipment Recommendations None recommended by PT  Recommendations for Other Services       Functional Status Assessment Patient has had a recent decline in their functional status and demonstrates the ability to make significant improvements in function in a reasonable and predictable amount of time.     Precautions / Restrictions Precautions Precautions: Fall Restrictions Weight Bearing Restrictions: No      Mobility  Bed Mobility Overal bed mobility: Modified Independent                  Transfers Overall transfer level: Modified independent                 General transfer comment: good return for transferring to chair without AD    Ambulation/Gait Ambulation/Gait assistance: Supervision Gait Distance (Feet): 100 Feet Assistive device: None Gait Pattern/deviations: Decreased step length - right, Decreased step length - left, Decreased stride length Gait velocity: decreased     General Gait Details: slightly labored cadence without loss of balance or need for an AD, no c/o dizziness  Stairs            Wheelchair Mobility    Modified Rankin (Stroke Patients Only)       Balance Overall balance  assessment: Mild deficits observed, not formally tested                                           Pertinent Vitals/Pain Pain Assessment Pain Assessment: No/denies pain    Home Living Family/patient expects to be discharged to:: Private residence Living Arrangements:  Spouse/significant other;Other (Comment)   Type of Home: Apartment Home Access: Level entry       Home Layout: One level Home Equipment: None      Prior Function Prior Level of Function : Independent/Modified Independent;History of Falls (last six months)             Mobility Comments: Hydrographic surveyor, drives, 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 ADLs Comments: Independent     Hand Dominance        Extremity/Trunk Assessment   Upper Extremity Assessment Upper Extremity Assessment: Overall WFL for tasks assessed    Lower Extremity Assessment Lower Extremity Assessment: Generalized weakness    Cervical / Trunk Assessment Cervical / Trunk Assessment: Normal  Communication   Communication: No difficulties  Cognition Arousal/Alertness: Awake/alert Behavior During Therapy: WFL for tasks assessed/performed Overall Cognitive Status: Within Functional Limits for tasks assessed                                          General Comments      Exercises     Assessment/Plan    PT Assessment Patient needs continued PT services  PT Problem List Decreased strength;Decreased activity tolerance;Decreased balance;Decreased mobility       PT Treatment Interventions DME instruction;Gait training;Stair training;Functional mobility training;Therapeutic activities;Therapeutic exercise;Patient/family education;Balance training    PT Goals (Current goals can be found in the Care Plan section)  Acute Rehab PT Goals Patient Stated Goal: return home with family to assist PT Goal Formulation: With patient Time For Goal Achievement: 12/28/22 Potential to Achieve Goals: Good    Frequency Min 3X/week     Co-evaluation               AM-PAC PT "6 Clicks" Mobility  Outcome Measure Help needed turning from your back to your side while in a flat bed without using bedrails?: None Help needed moving from lying on your back to sitting on the side of a flat  bed without using bedrails?: None Help needed moving to and from a bed to a chair (including a wheelchair)?: None Help needed standing up from a chair using your arms (e.g., wheelchair or bedside chair)?: None Help needed to walk in hospital room?: A Little Help needed climbing 3-5 steps with a railing? : A Little 6 Click Score: 22    End of Session   Activity Tolerance: Patient tolerated treatment well;Patient limited by fatigue Patient left: in chair;with call bell/phone within reach;with family/visitor present Nurse Communication: Mobility status PT Visit Diagnosis: Unsteadiness on feet (R26.81);Other abnormalities of gait and mobility (R26.89);Muscle weakness (generalized) (M62.81)    Time: [id number] PT Time Calculation (min) (ACUTE ONLY): 25 min   Charges:   PT Evaluation $PT Eval Moderate Complexity: 1 Mod PT Treatments $Therapeutic Activity: 23-37 mins        1:54 PM, 12/24/22 Lonell Grandchild, MPT Physical Therapist with [facility] [phone] office [phone] mobile phone

## 2022-12-24 NOTE — Progress Notes (Signed)
Patient blood pressure 92/44, map 61, HR 88 around 2213. MD Zierle-Ghosh notified. Received order to hold metoprolol succinate (Toprol-xl) 24 hr tablet due to low bp.    Patient c/o itching on BLE around 0155. MD Zierle-Ghosh notified. Received order for one time dose of Benadryl injection 25 mg. Benadryl given. Per patient medication was effective.

## 2022-12-24 NOTE — Plan of Care (Signed)
  Problem: Acute Rehab PT Goals(only PT should resolve) Goal: Pt Will Go Supine/Side To Sit Outcome: Progressing Flowsheets (Taken 12/24/2022 1358) Pt will go Supine/Side to Sit:  Independently  with modified independence Goal: Patient Will Transfer Sit To/From Stand Outcome: Progressing Flowsheets (Taken 12/24/2022 1358) Patient will transfer sit to/from stand:  Independently  with modified independence Goal: Pt Will Transfer Bed To Chair/Chair To Bed Outcome: Progressing Flowsheets (Taken 12/24/2022 1358) Pt will Transfer Bed to Chair/Chair to Bed:  Independently  with modified independence Goal: Pt Will Ambulate Outcome: Progressing Flowsheets (Taken 12/24/2022 1358) Pt will Ambulate:  > 125 feet  with modified independence   1:59 PM, 12/24/22 Lonell Grandchild, MPT Physical Therapist with [facility] 336 [phone] office [phone] mobile phone

## 2022-12-25 ENCOUNTER — Encounter (HOSPITAL_COMMUNITY): Payer: Self-pay | Admitting: Family Medicine

## 2022-12-25 ENCOUNTER — Inpatient Hospital Stay (HOSPITAL_COMMUNITY): Payer: Medicare Other | Admitting: Anesthesiology

## 2022-12-25 ENCOUNTER — Telehealth (INDEPENDENT_AMBULATORY_CARE_PROVIDER_SITE_OTHER): Payer: Self-pay | Admitting: Gastroenterology

## 2022-12-25 ENCOUNTER — Encounter (HOSPITAL_COMMUNITY)
Admission: EM | Disposition: A | Discharge status: Home / Self Care | Payer: Self-pay | Source: Home / Self Care | Attending: Family Medicine

## 2022-12-25 ENCOUNTER — Other Ambulatory Visit (HOSPITAL_BASED_OUTPATIENT_CLINIC_OR_DEPARTMENT_OTHER): Payer: Self-pay

## 2022-12-25 DIAGNOSIS — D124 Benign neoplasm of descending colon: Secondary | ICD-10-CM

## 2022-12-25 DIAGNOSIS — I10 Essential (primary) hypertension: Secondary | ICD-10-CM | POA: Diagnosis not present

## 2022-12-25 DIAGNOSIS — E876 Hypokalemia: Secondary | ICD-10-CM | POA: Diagnosis not present

## 2022-12-25 DIAGNOSIS — D509 Iron deficiency anemia, unspecified: Secondary | ICD-10-CM | POA: Diagnosis not present

## 2022-12-25 DIAGNOSIS — D649 Anemia, unspecified: Secondary | ICD-10-CM | POA: Diagnosis not present

## 2022-12-25 HISTORY — PX: POLYPECTOMY: SHX5525

## 2022-12-25 HISTORY — PX: COLONOSCOPY WITH PROPOFOL: SHX5780

## 2022-12-25 LAB — BASIC METABOLIC PANEL
Anion gap: 5 (ref 5–15)
BUN: 12 mg/dL (ref 8–23)
CO2: 17 mmol/L — ABNORMAL LOW (ref 22–32)
Calcium: 8.6 mg/dL — ABNORMAL LOW (ref 8.9–10.3)
Chloride: 116 mmol/L — ABNORMAL HIGH (ref 98–111)
Creatinine, Ser: 2.06 mg/dL — ABNORMAL HIGH (ref 0.44–1.00)
GFR, Estimated: 27 mL/min — ABNORMAL LOW (ref >60)
Glucose, Bld: 85 mg/dL (ref 70–99)
Potassium: 3.6 mmol/L (ref 3.5–5.1)
Sodium: 138 mmol/L (ref 135–145)

## 2022-12-25 LAB — CBC
HCT: 28 % — ABNORMAL LOW (ref 36.0–46.0)
Hemoglobin: 8.3 g/dL — ABNORMAL LOW (ref 12.0–15.0)
MCH: 22.4 pg — ABNORMAL LOW (ref 26.0–34.0)
MCHC: 29.6 g/dL — ABNORMAL LOW (ref 30.0–36.0)
MCV: 75.5 fL — ABNORMAL LOW (ref 80.0–100.0)
Platelets: 291 10*3/uL (ref 150–400)
RBC: 3.71 MIL/uL — ABNORMAL LOW (ref 3.87–5.11)
RDW: 20.5 % — ABNORMAL HIGH (ref 11.5–15.5)
WBC: 6.4 10*3/uL (ref 4.0–10.5)
nRBC: 0 % (ref 0.0–0.2)

## 2022-12-25 LAB — GLUCOSE, CAPILLARY
Glucose-Capillary: 92 mg/dL (ref 70–99)
Glucose-Capillary: 80 mg/dL (ref 70–99)
Glucose-Capillary: 77 mg/dL (ref 70–99)
Glucose-Capillary: 114 mg/dL — ABNORMAL HIGH (ref 70–99)

## 2022-12-25 LAB — SURGICAL PATHOLOGY

## 2022-12-25 SURGERY — COLONOSCOPY WITH PROPOFOL
Anesthesia: General

## 2022-12-25 MED ORDER — PROPOFOL 10 MG/ML IV BOLUS
INTRAVENOUS | Status: AC
  Filled 2022-12-25: qty 20

## 2022-12-25 MED ORDER — STERILE WATER FOR IRRIGATION IR SOLN
Status: DC | PRN
  Administered 2022-12-25: 100 mL

## 2022-12-25 MED ORDER — DAPAGLIFLOZIN PROPANEDIOL 10 MG PO TABS: 10.0000 mg | ORAL_TABLET | Freq: Every day | ORAL | 8 refills | Status: DC

## 2022-12-25 MED ORDER — SODIUM CHLORIDE 0.9 % IV SOLN: INTRAVENOUS | Status: DC

## 2022-12-25 MED ORDER — VITAMIN B-12 1000 MCG PO TABS: 1000.0000 ug | ORAL_TABLET | Freq: Every day | ORAL | 1 refills | Status: AC

## 2022-12-25 MED ORDER — LINACLOTIDE 290 MCG PO CAPS: 290.0000 ug | ORAL_CAPSULE | Freq: Every day | ORAL | 2 refills | Status: DC

## 2022-12-25 MED ORDER — PHENYLEPHRINE 80 MCG/ML (10ML) SYRINGE FOR IV PUSH (FOR BLOOD PRESSURE SUPPORT)
PREFILLED_SYRINGE | INTRAVENOUS | Status: AC
  Filled 2022-12-25: qty 10

## 2022-12-25 MED ORDER — LACTATED RINGERS IV SOLN: INTRAVENOUS | Status: DC | PRN

## 2022-12-25 MED ORDER — FERROUS SULFATE 325 (65 FE) MG PO TABS: 325.0000 mg | ORAL_TABLET | Freq: Two times a day (BID) | ORAL | 1 refills | Status: DC

## 2022-12-25 MED ORDER — LIDOCAINE HCL (CARDIAC) PF 100 MG/5ML IV SOSY
PREFILLED_SYRINGE | INTRAVENOUS | Status: DC | PRN
  Administered 2022-12-25: 60 mg via INTRATRACHEAL

## 2022-12-25 MED ORDER — PROMETHAZINE HCL 25 MG PO TABS: 25.0000 mg | ORAL_TABLET | Freq: Four times a day (QID) | ORAL | Status: AC | PRN

## 2022-12-25 MED ORDER — PROPOFOL 10 MG/ML IV BOLUS
INTRAVENOUS | Status: DC | PRN
  Administered 2022-12-25: 20 mg via INTRAVENOUS
  Administered 2022-12-25: 80 mg via INTRAVENOUS
  Administered 2022-12-25 (×3): 20 mg via INTRAVENOUS

## 2022-12-25 MED ORDER — METOPROLOL SUCCINATE ER 25 MG PO TB24: 12.5000 mg | ORAL_TABLET | Freq: Every day | ORAL | 3 refills | Status: DC

## 2022-12-25 MED ORDER — VITAMIN D3 25 MCG (1000 UNIT) PO TABS: 1000.0000 [IU] | ORAL_TABLET | Freq: Two times a day (BID) | ORAL | Status: AC

## 2022-12-25 MED ORDER — ROSUVASTATIN CALCIUM 40 MG PO TABS: 40.0000 mg | ORAL_TABLET | Freq: Every day | ORAL | Status: DC

## 2022-12-25 MED ORDER — PHENYLEPHRINE 80 MCG/ML (10ML) SYRINGE FOR IV PUSH (FOR BLOOD PRESSURE SUPPORT)
PREFILLED_SYRINGE | INTRAVENOUS | Status: DC | PRN
  Administered 2022-12-25 (×2): 160 ug via INTRAVENOUS

## 2022-12-25 MED ORDER — FOLIC ACID 1 MG PO TABS: 1.0000 mg | ORAL_TABLET | Freq: Every day | ORAL | 1 refills | Status: AC

## 2022-12-25 MED ORDER — DEXTROSE 50 % IV SOLN
INTRAVENOUS | Status: AC
  Filled 2022-12-25: qty 50

## 2022-12-25 MED ORDER — PROPOFOL 500 MG/50ML IV EMUL
INTRAVENOUS | Status: DC | PRN
  Administered 2022-12-25: 150 ug/kg/min via INTRAVENOUS

## 2022-12-25 MED ORDER — LIDOCAINE HCL (PF) 2 % IJ SOLN
INTRAMUSCULAR | Status: AC
  Filled 2022-12-25: qty 5

## 2022-12-25 MED ORDER — PANTOPRAZOLE SODIUM 40 MG PO TBEC: 40.0000 mg | DELAYED_RELEASE_TABLET | Freq: Two times a day (BID) | ORAL | 1 refills | Status: AC

## 2022-12-25 MED ORDER — FERROUS SULFATE 325 (65 FE) MG PO TABS: 325.0000 mg | ORAL_TABLET | Freq: Two times a day (BID) | ORAL | Status: DC

## 2022-12-25 MED ORDER — FUROSEMIDE 40 MG PO TABS: 60.0000 mg | ORAL_TABLET | Freq: Every day | ORAL | Status: DC

## 2022-12-25 MED ORDER — DEXTROSE 50 % IV SOLN
25.0000 mL | Freq: Once | INTRAVENOUS | Status: AC
  Administered 2022-12-25: 25 mL via INTRAVENOUS

## 2022-12-25 NOTE — Care Management Important Message (Signed)
Important Message  Patient Details  Name: Meghan Terry MRN: [id number] Date of Birth: Aug 06, 1961   Medicare Important Message Given:  Yes     Tommy Medal 12/25/2022, 12:07 PM

## 2022-12-25 NOTE — Telephone Encounter (Signed)
Mitzie, can you please schedule a follow up appointment for this patient in 2-3 weeks with me or any of the APPs?  Lavella Lemons, can you please schedule a capsule endoscopy? Dx: iron deficiency anemia.   Thanks,  Maylon Peppers, MD Gastroenterology and Hepatology [facility]

## 2022-12-25 NOTE — Progress Notes (Signed)
Pt had several small clear bowel movements with a small amount of brown sediment during the night. Pt consumed about a half of the container of goLYTELY and could not tolerate anymore. Pt was NPO at midnight.

## 2022-12-25 NOTE — Anesthesia Preprocedure Evaluation (Signed)
Anesthesia Evaluation  Patient identified by MRN, date of birth, ID band Patient awake    Reviewed: Allergy & Precautions, H&P , NPO status , Patient's Chart, lab work & pertinent test results  History of Anesthesia Complications Negative for: history of anesthetic complications  Airway Mallampati: II  TM Distance: >3 FB Neck ROM: Full    Dental no notable dental hx. (+) Edentulous Upper, Edentulous Lower, Dental Advisory Given   Pulmonary shortness of breath and with exertion, Patient abstained from smoking., former smoker   Pulmonary exam normal breath sounds clear to auscultation       Cardiovascular Exercise Tolerance: Good hypertension, Pt. on medications + angina  + CAD, + Cardiac Stents, + CABG, +CHF and + DOE  Normal cardiovascular exam Rhythm:Regular Rate:Normal     Neuro/Psych  Headaches PSYCHIATRIC DISORDERS Anxiety Depression       GI/Hepatic negative GI ROS, Neg liver ROS,,,  Endo/Other  diabetes, Well Controlled, Type 2, Oral Hypoglycemic AgentsHypothyroidism    Renal/GU Renal InsufficiencyRenal disease  negative genitourinary   Musculoskeletal negative musculoskeletal ROS (+)    Abdominal   Peds negative pediatric ROS (+)  Hematology  (+) Blood dyscrasia, anemia   Anesthesia Other Findings   Reproductive/Obstetrics negative OB ROS                             Anesthesia Physical Anesthesia Plan  ASA: 3  Anesthesia Plan: General   Post-op Pain Management: Minimal or no pain anticipated   Induction: Intravenous  PONV Risk Score and Plan: 1 and Propofol infusion  Airway Management Planned: Nasal Cannula and Natural Airway  Additional Equipment:   Intra-op Plan:   Post-operative Plan:   Informed Consent: I have reviewed the patients History and Physical, chart, labs and discussed the procedure including the risks, benefits and alternatives for the proposed  anesthesia with the patient or authorized representative who has indicated his/her understanding and acceptance.     Dental advisory given  Plan Discussed with: CRNA and Surgeon  Anesthesia Plan Comments:         Anesthesia Quick Evaluation

## 2022-12-25 NOTE — Progress Notes (Signed)
Unable to find [facility] accepting [employer] today. Patient referred to [facility] in [place].  Elnora Quizon, Clydene Pugh, LCSW

## 2022-12-25 NOTE — Discharge Instructions (Addendum)
PLEASE HAVE YOUR LABS CHECKED WITH PRIMARY CARE PROVIDER IN 1 WEEK   IMPORTANT INFORMATION: PAY CLOSE ATTENTION   PHYSICIAN DISCHARGE INSTRUCTIONS  Follow with Primary care provider  Patient, [name] Per  and other consultants as instructed by your Hospitalist Physician  [place] IF SYMPTOMS COME BACK, WORSEN OR NEW PROBLEM DEVELOPS   Please note: You were cared for by a hospitalist during your hospital stay. Every effort will be made to forward records to your primary care provider.  You can request that your primary care provider send for your hospital records if they have not received them.  Once you are discharged, your primary care physician will handle any further medical issues. Please note that NO REFILLS for any discharge medications will be authorized once you are discharged, as it is imperative that you return to your primary care physician (or establish a relationship with a primary care physician if you do not have one) for your post hospital discharge needs so that they can reassess your need for medications and monitor your lab values.  Please get a complete blood count and chemistry panel checked by your Primary MD at your next visit, and again as instructed by your Primary MD.  Get Medicines reviewed and adjusted: Please take all your medications with you for your next visit with your Primary MD  Laboratory/radiological data: Please request your Primary MD to go over all hospital tests and procedure/radiological results at the follow up, please ask your primary care provider to get all Hospital records sent to his/her office.  In some cases, they will be blood work, cultures and biopsy results pending at the time of your discharge. Please request that your primary care provider follow up on these results.  If you are diabetic, please bring your blood sugar readings with you to your follow up appointment with primary care.    Please call  and make your follow up appointments as soon as possible.    Also Note the following: If you experience worsening of your admission symptoms, develop shortness of breath, life threatening emergency, suicidal or homicidal thoughts you must seek medical attention immediately by calling 911 or calling your MD immediately  if symptoms less severe.  You must read complete instructions/literature along with all the possible adverse reactions/side effects for all the Medicines you take and that have been prescribed to you. Take any new Medicines after you have completely understood and accpet all the possible adverse reactions/side effects.   Do not drive when taking Pain medications or sleeping medications (Benzodiazepines)  Do not take more than prescribed Pain, Sleep and Anxiety Medications. It is not advisable to combine anxiety,sleep and pain medications without talking with your primary care practitioner  Special Instructions: If you have smoked or chewed Tobacco  in the last 2 yrs please stop smoking, stop any regular Alcohol  and or any Recreational drug use.  Wear Seat belts while driving.  Do not drive if taking any narcotic, mind altering or controlled substances or recreational drugs or alcohol.

## 2022-12-25 NOTE — Anesthesia Postprocedure Evaluation (Signed)
Anesthesia Post Note  Patient: Meghan Terry  Procedure(s) Performed: COLONOSCOPY WITH PROPOFOL POLYPECTOMY  Patient location during evaluation: Phase II Anesthesia Type: General Level of consciousness: awake and alert and oriented Pain management: pain level controlled Vital Signs Assessment: post-procedure vital signs reviewed and stable Respiratory status: spontaneous breathing, nonlabored ventilation and respiratory function stable Cardiovascular status: blood pressure returned to baseline and stable Postop Assessment: no apparent nausea or vomiting Anesthetic complications: no  No notable events documented.   Last Vitals:  Vitals:   12/25/22 1109 12/25/22 1115  BP: (!) 103/40 94/68  Pulse: 79 81  Resp: 16 18  Temp: 36.5 C   SpO2: 100% 100%    Last Pain:  Vitals:   12/25/22 1115  TempSrc:   PainSc: 0-No pain                 Davyd Podgorski C Raymundo Rout

## 2022-12-25 NOTE — Discharge Summary (Signed)
Physician Discharge Summary  JERRIAH TROYAN [id number] DOB: 14-Jul-1961 DOA: 12/21/2022  PCP: Wendie Chess Nephrologist: Thereasa Distance Cardiologist: Jyl Heinz  INTERNAL MED: [facility] GI: [facility]   Admit date: 12/21/2022 Discharge date: 12/25/2022  Admitted From:  Home  Disposition: Home   Recommendations for Outpatient Follow-up:  Follow up with PCP in 1 weeks Follow up with nephrologist in 1-2 weeks Follow up with cardiologist in 2-3 weeks Establish care with neurologist in 1 month Follow up with [facility] in 2-3 weeks  Please obtain BMP/CBC in 1 week to follow Hg, renal function, electrolytes  Home Health:  Outpatient PT   Discharge Condition: STABLE   CODE STATUS: FULL DIET: Renal/carb modified   Brief Hospitalization Summary: Please see all hospital notes, images, labs for full details of the hospitalization. 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  The patient states that every time she stands up from a recumbent position she has dizziness and has near syncopal episodes.  She does have some chest discomfort and increasing dyspnea on exertion.  She states that her shortness of breath has gradually worsened with minimal exertion.  She denies any fevers, chills, cough, hemoptysis, nausea, vomiting, diarrhea, hematochezia, melena.  There is no hematuria.  The patient states that she did have a few episodes of epistaxis on 12/19/2022.  However, she has had episodes of presyncope and dizziness even prior to that.  She denies any new medications.  She states she had a colonoscopy about 2  years ago in [facility].  Apparently was normal.  She continues on aspirin and Plavix for coronary artery disease.  In the ED, the patient was afebrile and hemodynamically stable with oxygen saturation 100% room air.  WBC 7.0, hemoglobin 5.5, platelets 320,000.  Sodium 132, potassium 3.2, bicarbonate 19, serum creatinine 2.90.  LFTs were unremarkable.  CT brain was negative for any acute findings.  CT cervical spine was negative for fracture or subluxation.  Chest x-ray was negative for any infiltrates or edema.  2 units PRBC were ordered.  FOBT was negative.   HOSPITAL COURSE BY PROBLEM   Symptomatic anemia -Follow up iron studies -Transfused 2 units PRBC - Hg up to >8 --iron saturation 4, ferritin 5 -GI consult appreciated>>EGD 3/27 - EGD this admission with abnormal esophagus, query pill induced injury. 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 Diffusely abnormal gastric mucosa status post biopsy.  -colonoscopy 3/29: - One 4 mm polyp in the descending colon, removed with a cold snare.  Resected and retrieved. - The distal rectum and anal verge are normal on retroflexion view.  -Baseline hemoglobin ~9 -Presented with hemoglobin 5.5 -folate 5.5>>replete -B12--277 GI RECOMMENDATIONS Dr. Jenetta Downer  - Return patient to hospital ward for ongoing care.  - Resume previous diet.  - Await pathology results.  - Repeat colonoscopy for surveillance based on pathology results.  - Schedule outpatient capsule endoscopy. - Start ferrous sulfate 325 mg BID. - Continue folic acid and 123456 supplementation. - Patient will follow up in GI clinic  in 2-3 weeks.   CKD stage IV -Baseline creatinine 2.8-3.1 -PT advised to follow up with her nephrologist in 1-2  weeks    Pyuria -UA>50 WBC -urine culture - NO GROWTH    Coronary artery disease -Status post CABG times 11/2018 -troponin 5>>5 -Holding aspirin and Plavix temporarily but spoke with  Dr. Jenetta Downer who says it can be resumed at discharge -Continue metoprolol succinate at lower dose of 12.5 mg due to soft BPs   Chronic diastolic CHF -Clinically euvolemic -04/30/2022 echo EF 60-65%, no WMA, trivial MR, mild decrease RVF -resume home furosemide at discharge   Microcytic anemia/Iron deficiency -iron saturation 4, ferritin 5 -GI recommending ferrous sulfate 325 mg BID  -continue linzess 290 mg daily  -supplemental [id number] and folic acid ordered    Hyperlipidemia -Continue Crestor   Anxiety/depression -Continue home dose of Klonopin and Wellbutrin  Intermittent confusional episodes -suspect this is from polypharmacy -GI physician requested patient be referred to neurology -ambulatory referral to [facility] made    Essential hypertension -Continue metoprolol succinate 12.5 mg daily    Diabetes mellitus type 2, controlled -04/30/2022 hemoglobin A1c 7.0 -Repeat hemoglobin 123456 -Holding Trulicity and Farxiga temporarily -RESUME HOME TREATMENTS AT DISCHARGE [facility] (last 3)  Recent Labs    12/25/22 0752 12/25/22 1015 12/25/22 1113  GLUCAP 80 77 114*    Hypothyroidism -Continue Synthroid   Restless leg syndrome -Continue Requip   Discharge Diagnoses:  Principal Problem:   Symptomatic anemia Active Problems:   Essential hypertension   Hypertension   Restless legs syndrome   Hypokalemia   IDA (iron deficiency anemia)   Discharge Instructions: Discharge Instructions     Ambulatory referral to Neurology   Complete by: As directed    An appointment is requested in approximately: 4 weeks   Ambulatory referral to Physical Therapy   Complete by: As directed       Allergies as of 12/25/2022       Reactions   Abilify [aripiprazole]    Ciprofloxacin Nausea And Vomiting   Carbidopa-levodopa Other (See Comments)   Developed tics while taking   Prednisone Other (See Comments)   Turns red all over   Venlafaxine Other (See Comments)   Developed tics  while taking - reaction to Effexor        Medication List     STOP taking these medications    Potassium Chloride ER 20 MEQ Tbcr       TAKE these medications    Acetaminophen Extra Strength 500 MG Tabs Take 2 tablets by mouth 3 times daily as needed for pain   aspirin EC 81 MG tablet Take 1 tablet (81 mg total) by mouth daily.   buPROPion 300 MG 24 hr tablet Commonly known as: WELLBUTRIN XL Take 1 tablet (300 mg total) by mouth daily.   cholecalciferol 25 MCG (1000 UT) tablet Generic drug: Cholecalciferol Take 1 tablet (1,000 Units total) by mouth in the morning and at bedtime.   clonazePAM 0.5 MG tablet Commonly known as: KLONOPIN Take 1 tablet (0.5 mg total) by mouth in the morning, at noon, and at bedtime. What changed: when to take this   clopidogrel 75 MG tablet Commonly known as: PLAVIX TAKE 1 TABLET BY MOUTH DAILY What changed:  how much to take how to take this when to take this   cyanocobalamin 1000 MCG tablet Commonly known as: VITAMIN B12 Take 1 tablet (1,000 mcg total) by mouth daily.   dapagliflozin propanediol 10 MG Tabs tablet Commonly known as: Farxiga Take 1 tablet (10 mg total) by mouth daily before breakfast. Start taking on: December 26, 2022   ferrous sulfate  325 (65 FE) MG tablet Take 1 tablet (325 mg total) by mouth 2 (two) times daily with a meal.   folic acid 1 MG tablet Commonly known as: FOLVITE Take 1 tablet (1 mg total) by mouth daily. Start taking on: December 26, 2022   furosemide 40 MG tablet Commonly known as: LASIX Take 1.5 tablets (60 mg total) by mouth daily. Start taking on: December 26, 2022   levothyroxine 50 MCG tablet Commonly known as: SYNTHROID TAKE 1 TABLET BY MOUTH ONCE DAILY ON AN EMPTY STOMACH 30 MINUTES BEFORE BREAKFAST ON SAT AND SUNDAY What changed:  how much to take how to take this when to take this additional instructions   levothyroxine 75 MCG tablet Commonly known as: SYNTHROID Take 1 tablet (75  mcg total) by mouth daily Monday thru Friday. What changed: Another medication with the same name was changed. Make sure you understand how and when to take each.   linaclotide 290 MCG Caps capsule Commonly known as: LINZESS Take 1 capsule (290 mcg total) by mouth daily before breakfast. Start taking on: December 26, 2022   metoprolol succinate 25 MG 24 hr tablet Commonly known as: TOPROL-XL Take 0.5 tablets (12.5 mg total) by mouth at bedtime. Take with or immediately following a meal. What changed: how much to take   nitroGLYCERIN 0.4 MG SL tablet Commonly known as: NITROSTAT Place 1 tablet (0.4 mg total) under the tongue every 5 (five) minutes as needed for chest pain.   ondansetron 4 MG tablet Commonly known as: ZOFRAN take 1 tablet (4 mg) by mouth 2 times per day as needed for nausea What changed:  how much to take how to take this when to take this reasons to take this   pantoprazole 40 MG tablet Commonly known as: PROTONIX Take 1 tablet (40 mg total) by mouth 2 (two) times daily.   promethazine 25 MG tablet Commonly known as: PHENERGAN Take 1 tablet (25 mg total) by mouth every 6 (six) hours as needed for nausea or vomiting.   ranolazine 500 MG 12 hr tablet Commonly known as: RANEXA Take 1 tablet by mouth 2 times daily   rOPINIRole 4 MG tablet Commonly known as: REQUIP Take 1 tablet (4 mg total) by mouth 1 to 3 hours before bedtime.   rOPINIRole 1 MG tablet Commonly known as: REQUIP Take 1 tablet (1 mg total) by mouth 3 (three) times daily.   rosuvastatin 40 MG tablet Commonly known as: CRESTOR Take 1 tablet (40 mg total) by mouth at bedtime.   topiramate 100 MG tablet Commonly known as: TOPAMAX TAKE 1 TABLET BY MOUTH TWICE DAILY.   traMADol 50 MG tablet Commonly known as: ULTRAM Take 1 tablet (50 mg total) by mouth every 6 (six) hours. What changed:  when to take this reasons to take this   traZODone 50 MG tablet Commonly known as: DESYREL Take 1  tablet (50 mg total) by mouth daily.   traZODone 150 MG tablet Commonly known as: DESYREL Take 1 tablet (150 mg total) by mouth in the morning and at bedtime.   Trulicity 1.5 0000000 Sopn Generic drug: Dulaglutide Inject 1.5 mg into the skin once a week.        Follow-up Information     Montez Morita, Quillian Quince, MD. Schedule an appointment as soon as possible for a visit in 2 week(s).   Specialty: Gastroenterology Why: Hospital Follow Up Contact information: [address] [place] [address] [phone]  [facility]. Schedule an appointment as soon as possible for a visit in 3 week(s).   Why: Establish care for intermittent episodes of confusion Contact information: [address]     [place] [phone]        Primary Care Provider. Schedule an appointment as soon as possible for a visit in 1 week(s).   Why: Hospital Follow Up        Thereasa Distance, MD. Schedule an appointment as soon as possible for a visit in 1 week(s).   Specialty: Nephrology Why: Hospital Follow Up and labs Contact information: [id number] [place]  [address] [phone]         Revankar, Reita Cliche, MD. Schedule an appointment as soon as possible for a visit in 2 week(s).   Specialty: Cardiology Why: Hospital Follow Up Contact information: [address] [place]  [place] [address] [phone]                Allergies  Allergen Reactions   Abilify [Aripiprazole]    Ciprofloxacin Nausea And Vomiting   Carbidopa-Levodopa Other (See Comments)    Developed tics while taking    Prednisone Other (See Comments)    Turns red all over    Venlafaxine Other (See Comments)    Developed tics while taking - reaction to Effexor    Allergies as of 12/25/2022       Reactions   Abilify [aripiprazole]    Ciprofloxacin Nausea And Vomiting   Carbidopa-levodopa  Other (See Comments)   Developed tics while taking   Prednisone Other (See Comments)   Turns red all over   Venlafaxine Other (See Comments)   Developed tics while taking - reaction to Effexor        Medication List     STOP taking these medications    Potassium Chloride ER 20 MEQ Tbcr       TAKE these medications    Acetaminophen Extra Strength 500 MG Tabs Take 2 tablets by mouth 3 times daily as needed for pain   aspirin EC 81 MG tablet Take 1 tablet (81 mg total) by mouth daily.   buPROPion 300 MG 24 hr tablet Commonly known as: WELLBUTRIN XL Take 1 tablet (300 mg total) by mouth daily.   cholecalciferol 25 MCG (1000 UT) tablet Generic drug: Cholecalciferol Take 1 tablet (1,000 Units total) by mouth in the morning and at bedtime.   clonazePAM 0.5 MG tablet Commonly known as: KLONOPIN Take 1 tablet (0.5 mg total) by mouth in the morning, at noon, and at bedtime. What changed: when to take this   clopidogrel 75 MG tablet Commonly known as: PLAVIX TAKE 1 TABLET BY MOUTH DAILY What changed:  how much to take how to take this when to take this   cyanocobalamin 1000 MCG tablet Commonly known as: VITAMIN B12 Take 1 tablet (1,000 mcg total) by mouth daily.   dapagliflozin propanediol 10 MG Tabs tablet Commonly known as: Farxiga Take 1 tablet (10 mg total) by mouth daily before breakfast. Start taking on: December 26, 2022   ferrous sulfate 325 (65 FE) MG tablet Take 1 tablet (325 mg total) by mouth 2 (two) times daily with a meal.   folic acid 1 MG tablet Commonly known as: FOLVITE Take 1 tablet (1 mg total) by mouth daily. Start taking on: December 26, 2022   furosemide 40 MG tablet Commonly known as: LASIX Take 1.5 tablets (60 mg  total) by mouth daily. Start taking on: December 26, 2022   levothyroxine 50 MCG tablet Commonly known as: SYNTHROID TAKE 1 TABLET BY MOUTH ONCE DAILY ON AN EMPTY STOMACH 30 MINUTES BEFORE BREAKFAST ON SAT AND SUNDAY What  changed:  how much to take how to take this when to take this additional instructions   levothyroxine 75 MCG tablet Commonly known as: SYNTHROID Take 1 tablet (75 mcg total) by mouth daily Monday thru Friday. What changed: Another medication with the same name was changed. Make sure you understand how and when to take each.   linaclotide 290 MCG Caps capsule Commonly known as: LINZESS Take 1 capsule (290 mcg total) by mouth daily before breakfast. Start taking on: December 26, 2022   metoprolol succinate 25 MG 24 hr tablet Commonly known as: TOPROL-XL Take 0.5 tablets (12.5 mg total) by mouth at bedtime. Take with or immediately following a meal. What changed: how much to take   nitroGLYCERIN 0.4 MG SL tablet Commonly known as: NITROSTAT Place 1 tablet (0.4 mg total) under the tongue every 5 (five) minutes as needed for chest pain.   ondansetron 4 MG tablet Commonly known as: ZOFRAN take 1 tablet (4 mg) by mouth 2 times per day as needed for nausea What changed:  how much to take how to take this when to take this reasons to take this   pantoprazole 40 MG tablet Commonly known as: PROTONIX Take 1 tablet (40 mg total) by mouth 2 (two) times daily.   promethazine 25 MG tablet Commonly known as: PHENERGAN Take 1 tablet (25 mg total) by mouth every 6 (six) hours as needed for nausea or vomiting.   ranolazine 500 MG 12 hr tablet Commonly known as: RANEXA Take 1 tablet by mouth 2 times daily   rOPINIRole 4 MG tablet Commonly known as: REQUIP Take 1 tablet (4 mg total) by mouth 1 to 3 hours before bedtime.   rOPINIRole 1 MG tablet Commonly known as: REQUIP Take 1 tablet (1 mg total) by mouth 3 (three) times daily.   rosuvastatin 40 MG tablet Commonly known as: CRESTOR Take 1 tablet (40 mg total) by mouth at bedtime.   topiramate 100 MG tablet Commonly known as: TOPAMAX TAKE 1 TABLET BY MOUTH TWICE DAILY.   traMADol 50 MG tablet Commonly known as: ULTRAM Take 1  tablet (50 mg total) by mouth every 6 (six) hours. What changed:  when to take this reasons to take this   traZODone 50 MG tablet Commonly known as: DESYREL Take 1 tablet (50 mg total) by mouth daily.   traZODone 150 MG tablet Commonly known as: DESYREL Take 1 tablet (150 mg total) by mouth in the morning and at bedtime.   Trulicity 1.5 MG/0.5ML Sopn Generic drug: Dulaglutide Inject 1.5 mg into the skin once a week.        Procedures/Studies: ECHOCARDIOGRAM COMPLETE  Result Date: 12/22/2022    ECHOCARDIOGRAM REPORT   Patient Name:   Myeisha A Marschke Date of Exam: 12/22/2022 Medical Rec #:  [id number]    Height:       63.0 in Accession #:    [id number]   Weight:       131.8 lb Date of Birth:  08/23/1961     BSA:          1.620 m Patient Age:    61 years     BP:           93 /39 mmHg Patient Gender: F  HR:           81 bpm. Exam Location:  [facility] Procedure: 2D Echo, Cardiac Doppler and Color Doppler Indications:    Chest Pain R07.9  History:        Patient has prior history of Echocardiogram examinations, most                 recent 04/30/2022. CHF, CAD, Prior CABG, Signs/Symptoms:Chest                 Pain; Risk Factors:Former Smoker, Hypertension and Diabetes.  Sonographer:    Greer Pickerel Referring Phys: [phone] DAVID TAT  Sonographer Comments: Image acquisition challenging due to patient body habitus and Image acquisition challenging due to respiratory motion. IMPRESSIONS  1. Left ventricular ejection fraction, by estimation, is 60 to 65%. The left ventricle has normal function. The left ventricle has no regional wall motion abnormalities. Left ventricular diastolic parameters are consistent with Grade II diastolic dysfunction (pseudonormalization).  2. Right ventricular systolic function is normal. The right ventricular size is normal. There is normal pulmonary artery systolic pressure. The estimated right ventricular systolic pressure is Q000111Q mmHg.  3. Left atrial size was mildly  dilated.  4. The mitral valve is grossly normal. Mild mitral valve regurgitation.  5. The aortic valve is tricuspid. There is mild calcification of the aortic valve. Aortic valve regurgitation is not visualized. Aortic valve sclerosis is present, with no evidence of aortic valve stenosis.  6. The inferior vena cava is normal in size with greater than 50% respiratory variability, suggesting right atrial pressure of 3 mmHg. Comparison(s): Prior images reviewed side by side. LVEF remains normal range at 60-65%. RV contraction is normal. FINDINGS  Left Ventricle: Left ventricular ejection fraction, by estimation, is 60 to 65%. The left ventricle has normal function. The left ventricle has no regional wall motion abnormalities. The left ventricular internal cavity size was normal in size. There is  no left ventricular hypertrophy. Left ventricular diastolic parameters are consistent with Grade II diastolic dysfunction (pseudonormalization). Right Ventricle: The right ventricular size is normal. No increase in right ventricular wall thickness. Right ventricular systolic function is normal. There is normal pulmonary artery systolic pressure. The tricuspid regurgitant velocity is 2.00 m/s, and  with an assumed right atrial pressure of 3 mmHg, the estimated right ventricular systolic pressure is Q000111Q mmHg. Left Atrium: Left atrial size was mildly dilated. Right Atrium: Right atrial size was normal in size. Pericardium: There is no evidence of pericardial effusion. Mitral Valve: The mitral valve is grossly normal. Mild mitral valve regurgitation. Tricuspid Valve: The tricuspid valve is grossly normal. Tricuspid valve regurgitation is trivial. Aortic Valve: The aortic valve is tricuspid. There is mild calcification of the aortic valve. Aortic valve regurgitation is not visualized. Aortic valve sclerosis is present, with no evidence of aortic valve stenosis. Pulmonic Valve: The pulmonic valve was grossly normal. Pulmonic valve  regurgitation is not visualized. Aorta: The aortic root is normal in size and structure. Venous: The inferior vena cava is normal in size with greater than 50% respiratory variability, suggesting right atrial pressure of 3 mmHg. IAS/Shunts: No atrial level shunt detected by color flow Doppler.  LEFT VENTRICLE PLAX 2D LVIDd:         4.00 cm   Diastology LVIDs:         2.60 cm   LV e' medial:    5.66 cm/s LV PW:         0.90 cm   LV E/e'  medial:  18.2 LV IVS:        0.80 cm   LV e' lateral:   8.59 cm/s LVOT diam:     1.90 cm   LV E/e' lateral: 12.0 LV SV:         56 LV SV Index:   35 LVOT Area:     2.84 cm  RIGHT VENTRICLE RV S prime:     7.42 cm/s TAPSE (M-mode): 1.1 cm LEFT ATRIUM             Index        RIGHT ATRIUM           Index LA diam:        3.30 cm 2.04 cm/m   RA Area:     14.90 cm LA Vol (A2C):   53.9 ml 33.28 ml/m  RA Volume:   36.10 ml  22.29 ml/m LA Vol (A4C):   64.0 ml 39.51 ml/m LA Biplane Vol: 60.6 ml 37.41 ml/m  AORTIC VALVE LVOT Vmax:   85.30 cm/s LVOT Vmean:  58.500 cm/s LVOT VTI:    0.198 m  AORTA Ao Root diam: 3.40 cm Ao Asc diam:  3.10 cm MITRAL VALVE                TRICUSPID VALVE MV Area (PHT): 4.29 cm     TR Peak grad:   16.0 mmHg MV Decel Time: 177 msec     TR Vmax:        200.00 cm/s MR Peak grad: 65.9 mmHg MR Vmax:      406.00 cm/s   SHUNTS MV E velocity: 103.00 cm/s  Systemic VTI:  0.20 m MV A velocity: 83.70 cm/s   Systemic Diam: 1.90 cm MV E/A ratio:  1.23 Rozann Lesches MD Electronically signed by Rozann Lesches MD Signature Date/Time: 12/22/2022/10:56:29 AM    Final    CT Head Wo Contrast  Result Date: 12/21/2022 CLINICAL DATA:  Syncope, multiple falls EXAM: CT HEAD WITHOUT CONTRAST CT CERVICAL SPINE WITHOUT CONTRAST TECHNIQUE: Multidetector CT imaging of the head and cervical spine was performed following the standard protocol without intravenous contrast. Multiplanar CT image reconstructions of the cervical spine were also generated. RADIATION DOSE REDUCTION: This exam  was performed according to the departmental dose-optimization program which includes automated exposure control, adjustment of the mA and/or kV according to patient size and/or use of iterative reconstruction technique. COMPARISON:  04/29/2022 FINDINGS: CT HEAD FINDINGS Brain: No evidence of acute infarction, hemorrhage, hydrocephalus, extra-axial collection or mass lesion/mass effect. Mild periventricular white matter hypodensity. Vascular: No hyperdense vessel or unexpected calcification. Skull: Normal. Negative for fracture or focal lesion. Sinuses/Orbits: No acute finding. Other: None. CT CERVICAL SPINE FINDINGS Alignment: Normal. Skull base and vertebrae: No acute fracture. No primary bone lesion or focal pathologic process. Soft tissues and spinal canal: No prevertebral fluid or swelling. No visible canal hematoma. Disc levels: Generally mild multilevel disc space height loss and osteophytosis, focally moderate at C5-C6. Upper chest: Negative. Other: None. IMPRESSION: 1. No acute intracranial pathology. Small-vessel white matter disease. 2. No fracture or static subluxation of the cervical spine. 3. Generally mild multilevel disc space height loss and osteophytosis, focally moderate at C5-C6. Electronically Signed   By: Delanna Ahmadi M.D.   On: 12/21/2022 14:28   CT Cervical Spine Wo Contrast  Result Date: 12/21/2022 CLINICAL DATA:  Syncope, multiple falls EXAM: CT HEAD WITHOUT CONTRAST CT CERVICAL SPINE WITHOUT CONTRAST TECHNIQUE: Multidetector CT imaging of the head and cervical spine  was performed following the standard protocol without intravenous contrast. Multiplanar CT image reconstructions of the cervical spine were also generated. RADIATION DOSE REDUCTION: This exam was performed according to the departmental dose-optimization program which includes automated exposure control, adjustment of the mA and/or kV according to patient size and/or use of iterative reconstruction technique. COMPARISON:   04/29/2022 FINDINGS: CT HEAD FINDINGS Brain: No evidence of acute infarction, hemorrhage, hydrocephalus, extra-axial collection or mass lesion/mass effect. Mild periventricular white matter hypodensity. Vascular: No hyperdense vessel or unexpected calcification. Skull: Normal. Negative for fracture or focal lesion. Sinuses/Orbits: No acute finding. Other: None. CT CERVICAL SPINE FINDINGS Alignment: Normal. Skull base and vertebrae: No acute fracture. No primary bone lesion or focal pathologic process. Soft tissues and spinal canal: No prevertebral fluid or swelling. No visible canal hematoma. Disc levels: Generally mild multilevel disc space height loss and osteophytosis, focally moderate at C5-C6. Upper chest: Negative. Other: None. IMPRESSION: 1. No acute intracranial pathology. Small-vessel white matter disease. 2. No fracture or static subluxation of the cervical spine. 3. Generally mild multilevel disc space height loss and osteophytosis, focally moderate at C5-C6. Electronically Signed   By: Delanna Ahmadi M.D.   On: 12/21/2022 14:28   CT CHEST ABDOMEN PELVIS WO CONTRAST  Result Date: 12/21/2022 CLINICAL DATA:  Multiple falls, chest pain EXAM: CT CHEST, ABDOMEN AND PELVIS WITHOUT CONTRAST TECHNIQUE: Multidetector CT imaging of the chest, abdomen and pelvis was performed following the standard protocol without IV contrast. RADIATION DOSE REDUCTION: This exam was performed according to the departmental dose-optimization program which includes automated exposure control, adjustment of the mA and/or kV according to patient size and/or use of iterative reconstruction technique. COMPARISON:  CT chest, 06/16/2022 FINDINGS: CT CHEST FINDINGS Cardiovascular: Aortic atherosclerosis. Normal heart size. Extensive three-vessel coronary artery calcifications and or stents status post median sternotomy and CABG. Mediastinum/Nodes: No enlarged mediastinal, hilar, or axillary lymph nodes. Thyroid gland, trachea, and  esophagus demonstrate no significant findings. Lungs/Pleura: Mild pulmonary fibrosis in a pattern with apical to basal gradient featuring irregular peripheral interstitial opacity and ground-glass, with some evidence of subpleural sparing. No pleural effusion or pneumothorax. Musculoskeletal: No chest wall abnormality. No acute osseous findings. CT ABDOMEN PELVIS FINDINGS Hepatobiliary: No focal liver abnormality is seen. Status post cholecystectomy. No biliary dilatation. Pancreas: Unremarkable. No pancreatic ductal dilatation or surrounding inflammatory changes. Spleen: Normal in size without significant abnormality. Adrenals/Urinary Tract: Adrenal glands are unremarkable. Kidneys are normal, without renal calculi, solid lesion, or hydronephrosis. Bladder is unremarkable. Stomach/Bowel: Stomach is within normal limits. Appendix is not clearly visualized. No evidence of bowel wall thickening, distention, or inflammatory changes. Moderate burden of stool and stool balls throughout the colon and rectum. Vascular/Lymphatic: Aortic atherosclerosis. No enlarged abdominal or pelvic lymph nodes. Reproductive: Status post hysterectomy. Other: No abdominal wall hernia or abnormality. No ascites. Musculoskeletal: No acute osseous findings. IMPRESSION: 1. No noncontrast CT evidence of acute traumatic injury to the chest, abdomen, or pelvis. 2. Mild pulmonary fibrosis in a pattern with apical to basal gradient featuring irregular peripheral interstitial opacity and ground-glass, with some evidence of subpleural sparing. 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 3. Coronary artery disease. 4. Status post cholecystectomy, hysterectomy, and CABG. 5. Moderate burden of stool and stool balls throughout the colon and rectum. Aortic Atherosclerosis  (ICD10-I70.0). Electronically Signed   By: Jamse Mead.D.  On: 12/21/2022 14:25   DG Chest Portable 1 View  Result Date: 12/21/2022 CLINICAL DATA:  Dizziness with several falls and central chest pain EXAM: PORTABLE CHEST 1 VIEW COMPARISON:  Chest radiograph dated 05/02/2022 FINDINGS: Normal lung volumes. Similar peripheral reticulations. No pleural effusion or pneumothorax. Similar postsurgical cardiomediastinal silhouette. Median sternotomy wires are nondisplaced. No radiographic finding of acute displaced fracture. Right upper quadrant surgical clips. IMPRESSION: 1. No active disease. 2.  No radiographic finding of acute displaced fracture. Electronically Signed   By: Darrin Nipper M.D.   On: 12/21/2022 13:38     Subjective: Pt without any specific complaints, seen prior to colonoscopy; tolerated prep well; no rectal bleeding noted.   Discharge Exam: Vitals:   12/25/22 1120 12/25/22 1130  BP: 116/63 (!) 111/54  Pulse: 85 87  Resp: 18 13  Temp:    SpO2: 100% 100%   Vitals:   12/25/22 1109 12/25/22 1115 12/25/22 1120 12/25/22 1130  BP: (!) 103/40 94/68 116/63 (!) 111/54  Pulse: 79 81 85 87  Resp: 16 18 18 13   Temp: 97.7 F (36.5 C)     TempSrc:      SpO2: 100% 100% 100% 100%  Weight:      Height:       General: Pt is alert, awake, not in acute distress Cardiovascular: normal S1/S2 +, no rubs, no gallops Respiratory: CTA bilaterally, no wheezing, no rhonchi Abdominal: Soft, NT, ND, bowel sounds + Extremities: trace pretibial edema, no cyanosis   The results of significant diagnostics from this hospitalization (including imaging, microbiology, ancillary and laboratory) are listed below for reference.     Microbiology: No results found for this or any previous visit (from the past 240 hour(s)).   Labs: BNP (last 3 results) Recent Labs    04/09/22 1430 12/21/22 1313  BNP 156.4* [id number]   Basic Metabolic Panel: Recent Labs  Lab 12/21/22 1313 12/22/22 0417 12/23/22 0422  12/24/22 0419 12/25/22 0817  NA 132* 137 136 136 138  K 3.2* 4.0 3.7 3.5 3.6  CL 106 111 112* 113* 116*  CO2 19* 20* 19* 19* 17*  GLUCOSE 109* 78 86 99 85  BUN 29* 25* 19 16 12   CREATININE 2.90* 2.77* 2.45* 2.23* 2.06*  CALCIUM 8.5* 8.6* 8.6* 8.4* 8.6*  MG 2.3  --   --   --   --    Liver Function Tests: Recent Labs  Lab 12/21/22 1313  AST 15  ALT 14  ALKPHOS 67  BILITOT 0.4  PROT 6.0*  ALBUMIN 3.6   Recent Labs  Lab 12/21/22 1313  LIPASE 37   No results for input(s): "AMMONIA" in the last 168 hours. CBC: Recent Labs  Lab 12/21/22 1313 12/22/22 0417 12/22/22 1300 12/23/22 0422 12/24/22 0419 12/24/22 1304 12/25/22 0817  WBC 7.0 6.3  --  8.1 7.6  --  6.4  HGB 5.5* 5.1* 8.1* 8.8* 7.9* 9.2* 8.3*  HCT 18.6* 18.0* 26.9* 29.9* 26.5* 31.2* 28.0*  MCV 70.7* 73.5*  --  76.5* 76.1*  --  75.5*  PLT 328 281  --  311 285  --  291   Cardiac Enzymes: No results for input(s): "CKTOTAL", "CKMB", "CKMBINDEX", "TROPONINI" in the last 168 hours. BNP: Invalid input(s): "POCBNP" CBG: Recent Labs  Lab 12/24/22 2020 12/25/22 0322 12/25/22 0752 12/25/22 1015 12/25/22 1113  GLUCAP 72 92 80 77 114*   D-Dimer No results for input(s): "DDIMER" in the last 72 hours. Hgb A1c No results for input(s): "HGBA1C" in the last  72 hours. Lipid Profile No results for input(s): "CHOL", "HDL", "LDLCALC", "TRIG", "CHOLHDL", "LDLDIRECT" in the last 72 hours. Thyroid function studies No results for input(s): "TSH", "T4TOTAL", "T3FREE", "THYROIDAB" in the last 72 hours.  Invalid input(s): "FREET3" Anemia work up No results for input(s): "VITAMINB12", "FOLATE", "FERRITIN", "TIBC", "IRON", "RETICCTPCT" in the last 72 hours. Urinalysis    Component Value Date/Time   COLORURINE YELLOW 12/21/2022 1740   APPEARANCEUR CLOUDY (A) 12/21/2022 1740   LABSPEC 1.005 12/21/2022 1740   PHURINE 6.0 12/21/2022 1740   GLUCOSEU >=500 (A) 12/21/2022 1740   HGBUR MODERATE (A) 12/21/2022 1740    BILIRUBINUR NEGATIVE 12/21/2022 1740   KETONESUR NEGATIVE 12/21/2022 1740   PROTEINUR 30 (A) 12/21/2022 1740   NITRITE POSITIVE (A) 12/21/2022 1740   LEUKOCYTESUR LARGE (A) 12/21/2022 1740   Sepsis Labs Recent Labs  Lab 12/22/22 0417 12/23/22 0422 12/24/22 0419 12/25/22 0817  WBC 6.3 8.1 7.6 6.4   Microbiology No results found for this or any previous visit (from the past 240 hour(s)).  Time coordinating discharge: 44 mins   SIGNED:  Irwin Brakeman, MD  [facility] 12/25/2022, 1:02 PM How to contact the [facility] Attending or Consulting provider [place] or covering provider during after hours [place], for this patient?  Check the care team in [facility] and look for a) attending/consulting [facility] provider listed and b) the [facility] team listed Log into [email or web address] and use [place]'s universal password to access. If you do not have the password, please contact the hospital operator. Locate the [facility] provider you are looking for under [facility] and page to a number that you can be directly reached. If you still have difficulty reaching the provider, please page the [facility] (Director on Call) for the Hospitalists listed on amion for assistance.

## 2022-12-25 NOTE — Op Note (Signed)
[facility] Patient Name: Meghan Terry Procedure Date: 12/25/2022 9:23 AM MRN: [id number] Date of Birth: 10-Jul-1961 Attending MD: Maylon Peppers , , [id number] CSN: [id number] Age: 62 Admit Type: Inpatient Procedure:                Colonoscopy Indications:              Iron deficiency anemia Providers:                Maylon Peppers, Charlsie Quest. Theda Sers RN, RN, Randa Spike, Technician Referring MD:              Medicines:                Monitored Anesthesia Care Complications:            No immediate complications. Estimated Blood Loss:     Estimated blood loss: none. Procedure:                Pre-Anesthesia Assessment:                           - Prior to the procedure, a History and Physical                            was performed, and patient medications, allergies                            and sensitivities were reviewed. The patient's                            tolerance of previous anesthesia was reviewed.                           - The risks and benefits of the procedure and the                            sedation options and risks were discussed with the                            patient. All questions were answered and informed                            consent was obtained.                           - ASA Grade Assessment: III - A patient with severe                            systemic disease.                           After obtaining informed consent, the colonoscope                            was passed under direct vision. Throughout the  procedure, the patient's blood pressure, pulse, and                            oxygen saturations were monitored continuously. The                            [id number] scope was introduced through                            the anus and advanced to the the cecum, identified                            by appendiceal orifice and ileocecal valve. The                             colonoscopy was performed without difficulty. The                            patient tolerated the procedure well. The quality                            of the bowel preparation was adequate. Scope In: 10:37:47 AM Scope Out: 10:59:56 AM Scope Withdrawal Time: 0 hours 11 minutes 58 seconds  Total Procedure Duration: 0 hours 22 minutes 9 seconds  Findings:      The perianal and digital rectal examinations were normal.      A 4 mm polyp was found in the descending colon. The polyp was sessile.       The polyp was removed with a cold snare. Resection and retrieval were       complete.      The retroflexed view of the distal rectum and anal verge was normal and       showed no anal or rectal abnormalities.      I discussed the findings with daughter Meghan Terry [id number]. Impression:               - One 4 mm polyp in the descending colon, removed                            with a cold snare. Resected and retrieved.                           - The distal rectum and anal verge are normal on                            retroflexion view. Moderate Sedation:      Per Anesthesia Care Recommendation:           - Return patient to hospital ward for ongoing care.                           - Resume previous diet.                           - Await pathology results.                           -  Repeat colonoscopy for surveillance based on                            pathology results.                           - Schedule outpatient capsule endoscopy.                           - Start ferrous sulfate 325 mg BID.                           - Continue folic acid and [id number] supplementation. Procedure Code(s):        --- Professional ---                           [phone], Colonoscopy, flexible; with removal of                            tumor(s), polyp(s), or other lesion(s) by snare                            technique Diagnosis Code(s):        --- Professional ---                            D12.4, Benign neoplasm of descending colon                           D50.9, Iron deficiency anemia, unspecified CPT copyright 2022 American Medical Association. All rights reserved. The codes documented in this report are preliminary and upon coder review may  be revised to meet current compliance requirements. Maylon Peppers, MD Maylon Peppers,  12/25/2022 11:15:21 AM This report has been signed electronically. Number of Addenda: 0

## 2022-12-25 NOTE — Progress Notes (Signed)
We will proceed with colonoscopy as scheduled.  I thoroughly discussed with the patient the procedure, including the risks involved. Patient understands what the procedure involves including the benefits and any risks. Patient understands alternatives to the proposed procedure. Risks including (but not limited to) bleeding, tearing of the lining (perforation), rupture of adjacent organs, problems with heart and lung function, infection, and medication reactions. A small percentage of complications may require surgery, hospitalization, repeat endoscopic procedure, and/or transfusion.  Patient understood and agreed.  Lynetta Tomczak Castaneda, MD Gastroenterology and Hepatology [facility]  

## 2022-12-25 NOTE — Brief Op Note (Addendum)
12/25/2022  11:14 AM  PATIENT:  Meghan Terry  62 y.o. female  PRE-OPERATIVE DIAGNOSIS:  iron deficiency anemia  POST-OPERATIVE DIAGNOSIS:  descending colon polyp  PROCEDURE:  Procedure(s): COLONOSCOPY WITH PROPOFOL (N/A) POLYPECTOMY  SURGEON:  Surgeon(s) and Role:    * Harvel Quale, MD - Primary  Patient underwent colonoscopy under propofol sedation.  Tolerated the procedure adequately.    - One 4 mm polyp in the descending colon, removed with a cold snare.  Resected and retrieved.  - The distal rectum and anal verge are normal on retroflexion view.   RECOMMENDATIONS - Return patient to hospital ward for ongoing care.  - Resume previous diet.  - Await pathology results.  - Repeat colonoscopy for surveillance based on pathology results.  - Schedule outpatient capsule endoscopy. - Start ferrous sulfate 325 mg BID. - Continue folic acid and [id number] supplementation. - Patient will follow up in GI clinic  in 2-3 weeks. - GI service will sign-off, please call us back if you have any more questions.  Maylon Peppers, MD Gastroenterology and Hepatology [facility]

## 2022-12-25 NOTE — Transfer of Care (Signed)
Immediate Anesthesia Transfer of Care Note  Patient: Meghan Terry  Procedure(s) Performed: COLONOSCOPY WITH PROPOFOL POLYPECTOMY  Patient Location: PACU  Anesthesia Type:General  Level of Consciousness: sedated and drowsy  Airway & Oxygen Therapy: Patient Spontanous Breathing and Patient connected to nasal cannula oxygen  Post-op Assessment: Report given to RN and Post -op Vital signs reviewed and stable  Post vital signs: Reviewed and stable  Last Vitals:  Vitals Value Taken Time  BP 103/40 12/25/22   1108  Temp 97.7 12/25/22   1108  Pulse 80 12/25/22 1110  Resp 16 12/25/22 1110  SpO2 100 % 12/25/22 1110  Vitals shown include unvalidated device data.  Last Pain:  Vitals:   12/25/22 1035  TempSrc:   PainSc: 0-No pain      Patients Stated Pain Goal: 5 ([phone] [id number])  Complications: No notable events documented.

## 2022-12-28 ENCOUNTER — Other Ambulatory Visit (HOSPITAL_BASED_OUTPATIENT_CLINIC_OR_DEPARTMENT_OTHER): Payer: Self-pay

## 2022-12-28 NOTE — Telephone Encounter (Signed)
Left message to return call 

## 2022-12-29 ENCOUNTER — Telehealth: Payer: Self-pay | Admitting: Cardiology

## 2022-12-29 LAB — SURGICAL PATHOLOGY

## 2022-12-29 NOTE — Telephone Encounter (Signed)
Pt daughter called in on behalf of pt stating pt would to switch from Dr. Geraldo Pitter to Dr. Dellia Cloud, is this okay with you all?

## 2022-12-30 NOTE — Telephone Encounter (Signed)
Left message to return call. Will send letter   Notification or Prior Authorization is not required for the requested services You are not required to submit a notification/prior authorization based on the information provided. The number above acknowledges your inquiry and our response. Please write this number down and refer to it for future inquiries. If you still wish to submit your request for review, please select the Continue with Submission button below. Decision ID #: [id number] The number above acknowledges your inquiry and our response. Please write this number down and refer to it for future inquiries. Coverage and payment for an item or service is governed by the member's benefit plan document, and, if applicable, the provider's participation agreement with the Health Plan.

## 2023-01-01 ENCOUNTER — Other Ambulatory Visit (HOSPITAL_BASED_OUTPATIENT_CLINIC_OR_DEPARTMENT_OTHER): Payer: Self-pay

## 2023-01-01 ENCOUNTER — Encounter (HOSPITAL_COMMUNITY): Payer: Self-pay | Admitting: Internal Medicine

## 2023-01-01 NOTE — Telephone Encounter (Signed)
LVM with pt about switch approval

## 2023-01-04 ENCOUNTER — Encounter (HOSPITAL_COMMUNITY): Payer: Self-pay | Admitting: Gastroenterology

## 2023-01-05 ENCOUNTER — Encounter: Payer: Self-pay | Admitting: Internal Medicine

## 2023-01-12 ENCOUNTER — Other Ambulatory Visit (HOSPITAL_COMMUNITY)
Admission: RE | Admit: 2023-01-12 | Discharge: 2023-01-12 | Disposition: A | Discharge status: Home / Self Care | Payer: Medicare Other | Source: Ambulatory Visit | Attending: Nephrology | Admitting: Nephrology

## 2023-01-12 DIAGNOSIS — E1122 Type 2 diabetes mellitus with diabetic chronic kidney disease: Secondary | ICD-10-CM | POA: Diagnosis present

## 2023-01-12 DIAGNOSIS — I130 Hypertensive heart and chronic kidney disease with heart failure and stage 1 through stage 4 chronic kidney disease, or unspecified chronic kidney disease: Secondary | ICD-10-CM | POA: Insufficient documentation

## 2023-01-12 DIAGNOSIS — D508 Other iron deficiency anemias: Secondary | ICD-10-CM | POA: Diagnosis not present

## 2023-01-12 DIAGNOSIS — I5032 Chronic diastolic (congestive) heart failure: Secondary | ICD-10-CM | POA: Insufficient documentation

## 2023-01-12 DIAGNOSIS — D638 Anemia in other chronic diseases classified elsewhere: Secondary | ICD-10-CM | POA: Insufficient documentation

## 2023-01-12 DIAGNOSIS — N189 Chronic kidney disease, unspecified: Secondary | ICD-10-CM | POA: Insufficient documentation

## 2023-01-12 LAB — HEMOGLOBIN AND HEMATOCRIT, BLOOD
HCT: 26.5 % — ABNORMAL LOW (ref 36.0–46.0)
Hemoglobin: 8 g/dL — ABNORMAL LOW (ref 12.0–15.0)

## 2023-01-13 ENCOUNTER — Other Ambulatory Visit (HOSPITAL_COMMUNITY): Payer: Self-pay | Admitting: Nephrology

## 2023-01-13 DIAGNOSIS — D508 Other iron deficiency anemias: Secondary | ICD-10-CM

## 2023-01-13 DIAGNOSIS — D638 Anemia in other chronic diseases classified elsewhere: Secondary | ICD-10-CM

## 2023-01-13 DIAGNOSIS — I5032 Chronic diastolic (congestive) heart failure: Secondary | ICD-10-CM

## 2023-01-13 DIAGNOSIS — E1122 Type 2 diabetes mellitus with diabetic chronic kidney disease: Secondary | ICD-10-CM

## 2023-01-13 DIAGNOSIS — I129 Hypertensive chronic kidney disease with stage 1 through stage 4 chronic kidney disease, or unspecified chronic kidney disease: Secondary | ICD-10-CM

## 2023-01-18 ENCOUNTER — Other Ambulatory Visit: Payer: Self-pay

## 2023-01-18 ENCOUNTER — Emergency Department (HOSPITAL_COMMUNITY): Payer: Medicare Other

## 2023-01-18 ENCOUNTER — Encounter (HOSPITAL_COMMUNITY): Payer: Self-pay | Admitting: *Deleted

## 2023-01-18 ENCOUNTER — Emergency Department (HOSPITAL_COMMUNITY)
Admission: EM | Admit: 2023-01-18 | Discharge: 2023-01-18 | Disposition: A | Discharge status: Home / Self Care | Payer: Medicare Other | Attending: Emergency Medicine | Admitting: Emergency Medicine

## 2023-01-18 DIAGNOSIS — E119 Type 2 diabetes mellitus without complications: Secondary | ICD-10-CM | POA: Insufficient documentation

## 2023-01-18 DIAGNOSIS — L03115 Cellulitis of right lower limb: Secondary | ICD-10-CM

## 2023-01-18 DIAGNOSIS — Z7984 Long term (current) use of oral hypoglycemic drugs: Secondary | ICD-10-CM | POA: Insufficient documentation

## 2023-01-18 DIAGNOSIS — I130 Hypertensive heart and chronic kidney disease with heart failure and stage 1 through stage 4 chronic kidney disease, or unspecified chronic kidney disease: Secondary | ICD-10-CM | POA: Diagnosis not present

## 2023-01-18 DIAGNOSIS — Z79899 Other long term (current) drug therapy: Secondary | ICD-10-CM | POA: Diagnosis not present

## 2023-01-18 DIAGNOSIS — M7989 Other specified soft tissue disorders: Secondary | ICD-10-CM | POA: Diagnosis present

## 2023-01-18 DIAGNOSIS — N189 Chronic kidney disease, unspecified: Secondary | ICD-10-CM | POA: Diagnosis not present

## 2023-01-18 DIAGNOSIS — Z7982 Long term (current) use of aspirin: Secondary | ICD-10-CM | POA: Diagnosis not present

## 2023-01-18 DIAGNOSIS — I2510 Atherosclerotic heart disease of native coronary artery without angina pectoris: Secondary | ICD-10-CM | POA: Insufficient documentation

## 2023-01-18 DIAGNOSIS — Z794 Long term (current) use of insulin: Secondary | ICD-10-CM | POA: Diagnosis not present

## 2023-01-18 DIAGNOSIS — Z955 Presence of coronary angioplasty implant and graft: Secondary | ICD-10-CM | POA: Insufficient documentation

## 2023-01-18 DIAGNOSIS — I509 Heart failure, unspecified: Secondary | ICD-10-CM | POA: Insufficient documentation

## 2023-01-18 LAB — CBC
HCT: 28 % — ABNORMAL LOW (ref 36.0–46.0)
Hemoglobin: 8.3 g/dL — ABNORMAL LOW (ref 12.0–15.0)
MCH: 25.9 pg — ABNORMAL LOW (ref 26.0–34.0)
MCHC: 29.6 g/dL — ABNORMAL LOW (ref 30.0–36.0)
MCV: 87.5 fL (ref 80.0–100.0)
Platelets: 296 10*3/uL (ref 150–400)
RBC: 3.2 MIL/uL — ABNORMAL LOW (ref 3.87–5.11)
WBC: 7.6 10*3/uL (ref 4.0–10.5)
nRBC: 0 % (ref 0.0–0.2)

## 2023-01-18 LAB — BASIC METABOLIC PANEL
Anion gap: 9 (ref 5–15)
BUN: 15 mg/dL (ref 8–23)
CO2: 21 mmol/L — ABNORMAL LOW (ref 22–32)
Calcium: 8.7 mg/dL — ABNORMAL LOW (ref 8.9–10.3)
Chloride: 107 mmol/L (ref 98–111)
Creatinine, Ser: 1.7 mg/dL — ABNORMAL HIGH (ref 0.44–1.00)
GFR, Estimated: 33 mL/min — ABNORMAL LOW (ref >60)
Glucose, Bld: 109 mg/dL — ABNORMAL HIGH (ref 70–99)
Potassium: 3.3 mmol/L — ABNORMAL LOW (ref 3.5–5.1)
Sodium: 137 mmol/L (ref 135–145)

## 2023-01-18 LAB — BRAIN NATRIURETIC PEPTIDE: B Natriuretic Peptide: 178 pg/mL — ABNORMAL HIGH (ref 0.0–100.0)

## 2023-01-18 LAB — CBG MONITORING, ED: Glucose-Capillary: 156 mg/dL — ABNORMAL HIGH (ref 70–99)

## 2023-01-18 LAB — TROPONIN I (HIGH SENSITIVITY)
Troponin I (High Sensitivity): 4 ng/L (ref <18)
Troponin I (High Sensitivity): 4 ng/L (ref <18)

## 2023-01-18 MED ORDER — CEPHALEXIN 500 MG PO CAPS: 500.0000 mg | ORAL_CAPSULE | Freq: Four times a day (QID) | ORAL | 0 refills | Status: DC

## 2023-01-18 NOTE — Discharge Instructions (Signed)
The ultrasound of your leg today did not show evidence of a blood clot.  The swelling and redness is likely coming from cellulitis.  You have been prescribed antibiotics to take for this.  Continue to elevate your legs when possible.  Take the antibiotic as directed until finished.  Please follow-up with your primary care provider for recheck.  Return to the emergency department for any new or worsening symptoms

## 2023-01-18 NOTE — Progress Notes (Unsigned)
GI Office Note    Referring Provider: No ref. provider found Primary Care Physician:  Benita Stabile, MD  Primary Gastroenterologist: Dolores Frame, MD  Chief Complaint   No chief complaint on file.  History of Present Illness   Meghan Terry is a 62 y.o. female presenting today at the request of No ref. provider found for ***hospital follow-up of acute on chronic IDA.   Recently seen inpatient at [facility] by our service 12/22/2022 - 12/25/2022 for 2 to 3-week history of weakness, dizziness, dyspnea on exertion and evidence of severe anemia. ***  Colonoscopy 12/25/2022: -4 mm polyp in the descending colon -Distal rectum and anal verge normal -Advised to start folic acid and B12 supplementation as well as ferrous sulfate 325 mg twice daily -Advised outpatient capsule endoscopy -Biopsy revealed tubular adenoma -Recommended repeat in 7 years.  ED visit 01/18/2023 for concern of cellulitis of right lower extremity. *** Labs with hemoglobin 8.3, MCV 87.5, platelets 296, potassium 3.3, creatinine 1.7, GFR 33, BNP 178.  Today:    Current Outpatient Medications  Medication Sig Dispense Refill   acetaminophen (TYLENOL) 500 MG tablet Take 2 tablets by mouth 3 times daily as needed for pain 100 tablet 0   aspirin EC 81 MG tablet Take 1 tablet (81 mg total) by mouth daily. 90 tablet 3   buPROPion (WELLBUTRIN XL) 300 MG 24 hr tablet Take 1 tablet (300 mg total) by mouth daily. 90 tablet 2   cephALEXin (KEFLEX) 500 MG capsule Take 1 capsule (500 mg total) by mouth 4 (four) times daily. 28 capsule 0   cholecalciferol 25 MCG (1000 UT) tablet Take 1 tablet (1,000 Units total) by mouth in the morning and at bedtime.     clonazePAM (KLONOPIN) 0.5 MG tablet Take 1 tablet (0.5 mg total) by mouth in the morning, at noon, and at bedtime. (Patient not taking: Reported on 01/18/2023) 30 tablet 0   clopidogrel (PLAVIX) 75 MG tablet TAKE 1 TABLET BY MOUTH DAILY (Patient taking differently: Take 75  mg by mouth daily.) 90 tablet 2   cyanocobalamin (VITAMIN B12) 1000 MCG tablet Take 1 tablet (1,000 mcg total) by mouth daily. 30 tablet 1   dapagliflozin propanediol (FARXIGA) 10 MG TABS tablet Take 1 tablet (10 mg total) by mouth daily before breakfast. 30 tablet 8   Dulaglutide (TRULICITY) 1.5 MG/0.5ML SOPN Inject 1.5 mg into the skin once a week. (Patient not taking: Reported on 01/18/2023) 6 mL 0   ferrous sulfate 325 (65 FE) MG tablet Take 1 tablet (325 mg total) by mouth 2 (two) times daily with a meal. 60 tablet 1   folic acid (FOLVITE) 1 MG tablet Take 1 tablet (1 mg total) by mouth daily. 30 tablet 1   furosemide (LASIX) 40 MG tablet Take 1.5 tablets (60 mg total) by mouth daily.     levothyroxine (SYNTHROID) 50 MCG tablet TAKE 1 TABLET BY MOUTH ONCE DAILY ON AN EMPTY STOMACH 30 MINUTES BEFORE BREAKFAST ON SAT AND SUNDAY (Patient taking differently: Take 50 mcg by mouth See admin instructions. Take 1 tablet in the morning with breakfast on SATURDAY AND SUNDAY.) 24 tablet 3   levothyroxine (SYNTHROID) 75 MCG tablet Take 1 tablet (75 mcg total) by mouth daily Monday thru Friday. 60 tablet 3   linaclotide (LINZESS) 290 MCG CAPS capsule Take 1 capsule (290 mcg total) by mouth daily before breakfast. 30 capsule 2   metoprolol succinate (TOPROL-XL) 25 MG 24 hr tablet Take 0.5 tablets (12.5 mg  total) by mouth at bedtime. Take with or immediately following a meal. 90 tablet 3   nitroGLYCERIN (NITROSTAT) 0.4 MG SL tablet Place 1 tablet (0.4 mg total) under the tongue every 5 (five) minutes as needed for chest pain. 25 tablet 7   ondansetron (ZOFRAN) 4 MG tablet take 1 tablet (4 mg) by mouth 2 times per day as needed for nausea (Patient taking differently: Take 4 mg by mouth 2 (two) times daily as needed for nausea or vomiting.) 180 tablet 1   pantoprazole (PROTONIX) 40 MG tablet Take 1 tablet (40 mg total) by mouth 2 (two) times daily. 60 tablet 1   promethazine (PHENERGAN) 25 MG tablet Take 1 tablet  (25 mg total) by mouth every 6 (six) hours as needed for nausea or vomiting.     ranolazine (RANEXA) 500 MG 12 hr tablet Take 1 tablet by mouth 2 times daily 180 tablet 3   rOPINIRole (REQUIP) 1 MG tablet Take 1 tablet (1 mg total) by mouth 3 (three) times daily. 90 tablet 0   rOPINIRole (REQUIP) 4 MG tablet Take 1 tablet (4 mg total) by mouth 1 to 3 hours before bedtime. 90 tablet 0   rosuvastatin (CRESTOR) 40 MG tablet Take 1 tablet (40 mg total) by mouth at bedtime.     topiramate (TOPAMAX) 100 MG tablet TAKE 1 TABLET BY MOUTH TWICE DAILY. 180 tablet 3   traMADol (ULTRAM) 50 MG tablet Take 1 tablet (50 mg total) by mouth every 6 (six) hours. (Patient taking differently: Take 50 mg by mouth daily as needed for moderate pain.) 30 tablet 2   traZODone (DESYREL) 150 MG tablet Take 1 tablet (150 mg total) by mouth in the morning and at bedtime. 30 tablet 0   traZODone (DESYREL) 50 MG tablet Take 1 tablet (50 mg total) by mouth daily. 30 tablet 0   No current facility-administered medications for this visit.    Past Medical History:  Diagnosis Date   Acute diastolic CHF (congestive heart failure) 06/08/2021   Acute pulmonary edema    Anemia due to stage 4 chronic kidney disease 07/20/2022   Angina pectoris 11/17/2019   Anxiety    Atypical chest pain 12/30/2020   Benign hypertension with CKD (chronic kidney disease) stage IV 07/20/2022   CHF (congestive heart failure) 06/08/2021   CKD (chronic kidney disease) stage 3b, GFR 30-59 ml/min 12/03/2019   Coronary artery disease    Coronary artery disease involving native coronary artery of native heart with angina pectoris 10/03/2019   Depression 04/25/2017   Diabetes mellitus due to underlying condition with unspecified complications 09/05/2019   Diabetes mellitus with stage 4 chronic kidney disease GFR 15-29 07/20/2022   Essential hypertension 09/05/2019   Ex-smoker 09/05/2019   Family history of coronary artery disease 09/05/2019   History  of depression 04/15/2020   Hx of diabetes mellitus 04/15/2020   Hyperlipidemia with target LDL less than 70 11/24/2019   Hyperphosphatemia 07/20/2022   Hypertension    Hypokalemia 04/30/2022   Hypothyroid    Migraine 04/25/2017   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   Syncope and collapse 04/29/2022   Thyroid disease    UTI (urinary  tract infection) 04/30/2022    Past Surgical History:  Procedure Laterality Date   ABDOMINAL HYSTERECTOMY     APPENDECTOMY     BIOPSY  12/23/2022   Procedure: BIOPSY;  Surgeon: Corbin Ade, MD;  [facility]: [facility];  Service: Endoscopy;;   CARDIAC CATHETERIZATION  11/22/2019   CHOLECYSTECTOMY     COLONOSCOPY     COLONOSCOPY WITH PROPOFOL N/A 12/25/2022   Procedure: COLONOSCOPY WITH PROPOFOL;  Surgeon: Dolores Frame, MD;  Location: [facility];  Service: Gastroenterology;  Laterality: N/A;   CORONARY ARTERY BYPASS GRAFT N/A 09/15/2019   Procedure: CORONARY ARTERY BYPASS GRAFTING (CABG) times three on pump using left internal mammary artery and right and left greater saphenous veins harvested endoscopically;  Surgeon: Alleen Borne, MD;  Location: [facility] OR;  Service: Open Heart Surgery;  Laterality: N/A;   CORONARY STENT INTERVENTION N/A 11/23/2019   Procedure: CORONARY STENT INTERVENTION;  Surgeon: Lennette Bihari, MD;  Location: [facility] INVASIVE CV LAB;  Service: Cardiovascular;  Laterality: N/A;   ESOPHAGEAL DILATION N/A 12/23/2022   Procedure: ESOPHAGEAL DILATION;  Surgeon: Corbin Ade, MD;  Location: [facility];  Service: Endoscopy;  Laterality: N/A;   ESOPHAGOGASTRODUODENOSCOPY (EGD) WITH PROPOFOL N/A 12/23/2022    Procedure: ESOPHAGOGASTRODUODENOSCOPY (EGD) WITH PROPOFOL;  Surgeon: Corbin Ade, MD;  Location: [facility];  Service: Endoscopy;  Laterality: N/A;   KNEE ARTHROSCOPY     LEFT HEART CATH AND CORONARY ANGIOGRAPHY N/A 09/14/2019   Procedure: LEFT HEART CATH AND CORONARY ANGIOGRAPHY;  Surgeon: Corky Crafts, MD;  Location: [facility] INVASIVE CV LAB;  Service: Cardiovascular;  Laterality: N/A;   LEFT HEART CATH AND CORS/GRAFTS ANGIOGRAPHY N/A 11/22/2019   Procedure: LEFT HEART CATH AND CORS/GRAFTS ANGIOGRAPHY;  Surgeon: Lennette Bihari, MD;  Location: [facility];  Service: Cardiovascular;  Laterality: N/A;   LEFT HEART CATH AND CORS/GRAFTS ANGIOGRAPHY N/A 03/13/2020   Procedure: LEFT HEART CATH AND CORS/GRAFTS ANGIOGRAPHY;  Surgeon: Runell Gess, MD;  Location: [facility];  Service: Cardiovascular;  Laterality: N/A;   LEFT HEART CATH AND CORS/GRAFTS ANGIOGRAPHY N/A 12/31/2020   Procedure: LEFT HEART CATH AND CORS/GRAFTS ANGIOGRAPHY;  Surgeon: Lyn Records, MD;  Location: [facility];  Service: Cardiovascular;  Laterality: N/A;   OSTEOCHONDRAL DEFECT REPAIR/RECONSTRUCTION Left 11/29/2014   Procedure: LEFT ANKLE MEDIAL MALLEOLUS OSTEOTOMY,AUTO GRAFT FROM CALCANEOUS;  OS TIBIA DENORO GRAFTING TALUS;  Surgeon: Toni Arthurs, MD;  Location: [facility];  Service: Orthopedics;  Laterality: Left;   POLYPECTOMY  12/25/2022   Procedure: POLYPECTOMY;  Surgeon: Dolores Frame, MD;  [facility]: [facility];  Service: Gastroenterology;;   TEE WITHOUT CARDIOVERSION N/A 09/15/2019   Procedure: TRANSESOPHAGEAL ECHOCARDIOGRAM (TEE);  Surgeon: Alleen Borne, MD;  Location: [facility] OR;  Service: Open Heart Surgery;  Laterality: N/A;   TUBAL LIGATION      Family History  Problem Relation Age of Onset   Asthma Mother    COPD Mother    Diabetes Mother    Macular degeneration Mother    Congestive Heart Failure Father     Allergies as of 01/19/2023 - Review Complete  01/18/2023  Allergen Reaction Noted   Abilify [aripiprazole]  08/10/2022   Ciprofloxacin Nausea And Vomiting 08/10/2022   Carbidopa-levodopa Other (See Comments) 05/14/2017   Prednisone Other (See Comments) 09/07/2019   Venlafaxine Other (See Comments) 05/14/2017    Social History   Socioeconomic History   Marital status: Married    Spouse name:  Meghan Terry   Number of children: 2   Years of education: Not on file   Highest education level: Associate degree: occupational, Scientist, product/process development, or vocational program  Occupational History   Occupation: disabilty    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  Tobacco Use   Smoking status: Former    Packs/day: 1.00    Years: 47.00    Additional pack years: 0.00    Total pack years: 47.00    Types: Cigarettes    Quit date: 09/15/2019    Years since quitting: 3.3    Passive exposure: Past   Smokeless tobacco: Never  Vaping Use   Vaping Use: Every day   Start date: 09/15/2019   Substances: Nicotine  Substance and Sexual Activity   Alcohol use: No   Drug use: No   Sexual activity: Not on file  Other Topics Concern   Not on file  Social History Narrative   Not on file   Social Determinants of Health   Financial Resource Strain: High Risk (06/19/2021)   Overall Financial Resource Strain (CARDIA)    Difficulty of Paying Living Expenses: Very hard  Food Insecurity: No Food Insecurity (12/21/2022)   Hunger Vital Sign    Worried About Running Out of Food in the Last Year: Never true    Ran Out of Food in the Last Year: Never true  Transportation Needs: No Transportation Needs (12/21/2022)   PRAPARE - Administrator, Civil Service (Medical): No    Lack of Transportation (Non-Medical): No  Physical Activity: Not on file  Stress: Not on file  Social Connections: Not on file  Intimate Partner Violence: Not At Risk (12/21/2022)   Humiliation, Afraid, Rape, and Kick questionnaire    Fear of Current or Ex-Partner: No    Emotionally  Abused: No    Physically Abused: No    Sexually Abused: No     Review of Systems   Gen: Denies any fever, chills, fatigue, weight loss, lack of appetite.  CV: Denies chest pain, heart palpitations, peripheral edema, syncope.  Resp: Denies shortness of breath at rest or with exertion. Denies wheezing or cough.  GI: see HPI GU : Denies urinary burning, urinary frequency, urinary hesitancy MS: Denies joint pain, muscle weakness, cramps, or limitation of movement.  Derm: Denies rash, itching, dry skin Psych: Denies depression, anxiety, memory loss, and confusion Heme: Denies bruising, bleeding, and enlarged lymph nodes.   Physical Exam   There were no vitals taken for this visit.  General:   Alert and oriented. Pleasant and cooperative. Well-nourished and well-developed.  Head:  Normocephalic and atraumatic. Eyes:  Without icterus, sclera clear and conjunctiva pink.  Ears:  Normal auditory acuity. Mouth:  No deformity or lesions, oral mucosa pink.  Lungs:  Clear to auscultation bilaterally. No wheezes, rales, or rhonchi. No distress.  Heart:  S1, S2 present without murmurs appreciated.  Abdomen:  +BS, soft, non-tender and non-distended. No HSM noted. No guarding or rebound. No masses appreciated.  Rectal:  Deferred  Msk:  Symmetrical without gross deformities. Normal posture. Extremities:  Without edema. Neurologic:  Alert and  oriented x4;  grossly normal neurologically. Skin:  Intact without significant lesions or rashes. Psych:  Alert and cooperative. Normal mood and affect.   Assessment   EMMANUELLA MIRANTE is a 62 y.o. female with a history of *** presenting today with      PLAN   *** Givens capsule?    Brooke Bonito, MSN, FNP-BC, AGACNP-BC [facility]

## 2023-01-18 NOTE — ED Triage Notes (Signed)
Pt c/o bilateral leg swelling x one week; pt was recently hospitalized for blood transfusions

## 2023-01-19 ENCOUNTER — Ambulatory Visit (INDEPENDENT_AMBULATORY_CARE_PROVIDER_SITE_OTHER): Payer: Medicare Other | Admitting: Gastroenterology

## 2023-01-19 ENCOUNTER — Encounter: Payer: Self-pay | Admitting: Gastroenterology

## 2023-01-19 ENCOUNTER — Telehealth: Payer: Self-pay | Admitting: Gastroenterology

## 2023-01-19 VITALS — BP 125/72 | HR 90 | Temp 97.2°F | Ht 63.0 in | Wt 142.2 lb

## 2023-01-19 DIAGNOSIS — D649 Anemia, unspecified: Secondary | ICD-10-CM

## 2023-01-19 DIAGNOSIS — K5900 Constipation, unspecified: Secondary | ICD-10-CM

## 2023-01-19 NOTE — Telephone Encounter (Signed)
Patient placed in reminder file.

## 2023-01-19 NOTE — Telephone Encounter (Signed)
Please place patient on recall for CBC, anemia panel (iron, iron sat, ferritin, B12 and folate) in 1 month.   Brooke Bonito, MSN, APRN, FNP-BC, AGACNP-BC [facility]

## 2023-01-19 NOTE — Patient Instructions (Addendum)
We are scheduling the capsule endoscopy for you.   Continue taking your vitamin B12, iron, and folic acid supplement daily.  For now taking your pantoprazole 40 mg twice daily.  I am ordering a KUB to reassess for any residual stool burden given you have been having frequent loose bowel movements on Linzess.  If you have had improvement in stool burden  on xray then we can decrease your Linzess dose to 145 mcg.  You may walk-in at any point to any plan within the next week to have the x-ray of your abdomen performed.  It was a pleasure to see you today. I want to create trusting relationships with patients. If you receive a survey regarding your visit,  I greatly appreciate you taking time to fill this out on paper or through your MyChart. I value your feedback.  Brooke Bonito, MSN, FNP-BC, AGACNP-BC [facility]

## 2023-01-20 ENCOUNTER — Encounter (INDEPENDENT_AMBULATORY_CARE_PROVIDER_SITE_OTHER): Payer: Self-pay

## 2023-01-20 NOTE — ED Provider Notes (Signed)
[place] EMERGENCY DEPARTMENT AT [facility] Provider Note   CSN: [id number] Arrival date & time: 01/18/23  7829     History  Chief Complaint  Patient presents with   Leg Swelling    Meghan Terry is a 62 y.o. female.  HPI      Meghan Terry is a 62 y.o. female past medical history of hypertension, status post CABG x 3, coronary artery disease, depression, diabetes, thyroid disease, CKD CHF who presents to the Emergency Department complaining of bilateral leg swelling for 1 week.  Right greater than left.  She has noticed some redness and pain to the right lower leg as well.  No known injury.  Pain to the right lower extremity worse with walking or standing.  She denies any chest pain or shortness of breath.  No fever or chills.   Home Medications Prior to Admission medications   Medication Sig Start Date End Date Taking? Authorizing Provider  aspirin EC 81 MG tablet Take 1 tablet (81 mg total) by mouth daily. 09/05/19  Yes Revankar, Aundra Dubin, MD  buPROPion (WELLBUTRIN XL) 300 MG 24 hr tablet Take 1 tablet (300 mg total) by mouth daily. 07/06/22  Yes   cephALEXin (KEFLEX) 500 MG capsule Take 1 capsule (500 mg total) by mouth 4 (four) times daily. 01/18/23  Yes Kaisha Wachob, PA-C  cholecalciferol 25 MCG (1000 UT) tablet Take 1 tablet (1,000 Units total) by mouth in the morning and at bedtime. 12/25/22  Yes Johnson, Clanford L, MD  clopidogrel (PLAVIX) 75 MG tablet TAKE 1 TABLET BY MOUTH DAILY Patient taking differently: Take 75 mg by mouth daily. 03/30/22  Yes Revankar, Aundra Dubin, MD  cyanocobalamin (VITAMIN B12) 1000 MCG tablet Take 1 tablet (1,000 mcg total) by mouth daily. 12/25/22  Yes Johnson, Clanford L, MD  dapagliflozin propanediol (FARXIGA) 10 MG TABS tablet Take 1 tablet (10 mg total) by mouth daily before breakfast. 12/26/22  Yes Johnson, Clanford L, MD  ferrous sulfate 325 (65 FE) MG tablet Take 1 tablet (325 mg total) by mouth 2 (two) times daily with a meal. 12/25/22   Yes Johnson, Clanford L, MD  folic acid (FOLVITE) 1 MG tablet Take 1 tablet (1 mg total) by mouth daily. 12/26/22  Yes Johnson, Clanford L, MD  furosemide (LASIX) 40 MG tablet Take 1.5 tablets (60 mg total) by mouth daily. 12/26/22  Yes Johnson, Clanford L, MD  levothyroxine (SYNTHROID) 50 MCG tablet TAKE 1 TABLET BY MOUTH ONCE DAILY ON AN EMPTY STOMACH 30 MINUTES BEFORE BREAKFAST ON SAT AND SUNDAY Patient taking differently: Take 50 mcg by mouth See admin instructions. Take 1 tablet in the morning with breakfast on SATURDAY AND SUNDAY. 05/28/22  Yes   levothyroxine (SYNTHROID) 75 MCG tablet Take 1 tablet (75 mcg total) by mouth daily Monday thru Friday. 06/08/22  Yes   linaclotide (LINZESS) 290 MCG CAPS capsule Take 1 capsule (290 mcg total) by mouth daily before breakfast. 12/26/22  Yes Johnson, Clanford L, MD  metoprolol succinate (TOPROL-XL) 25 MG 24 hr tablet Take 0.5 tablets (12.5 mg total) by mouth at bedtime. Take with or immediately following a meal. 12/25/22  Yes Johnson, Clanford L, MD  nitroGLYCERIN (NITROSTAT) 0.4 MG SL tablet Place 1 tablet (0.4 mg total) under the tongue every 5 (five) minutes as needed for chest pain. 12/29/21  Yes Revankar, Aundra Dubin, MD  ondansetron (ZOFRAN) 4 MG tablet take 1 tablet (4 mg) by mouth 2 times per day as needed for nausea  Patient taking differently: Take 4 mg by mouth 2 (two) times daily as needed for nausea or vomiting. 05/12/22  Yes   pantoprazole (PROTONIX) 40 MG tablet Take 1 tablet (40 mg total) by mouth 2 (two) times daily. 12/25/22  Yes Johnson, Clanford L, MD  promethazine (PHENERGAN) 25 MG tablet Take 1 tablet (25 mg total) by mouth every 6 (six) hours as needed for nausea or vomiting. 12/25/22  Yes Johnson, Clanford L, MD  ranolazine (RANEXA) 500 MG 12 hr tablet Take 1 tablet by mouth 2 times daily 12/25/21  Yes Revankar, Aundra Dubin, MD  rOPINIRole (REQUIP) 1 MG tablet Take 1 tablet (1 mg total) by mouth 3 (three) times daily. 11/12/22  Yes   rOPINIRole  (REQUIP) 4 MG tablet Take 1 tablet (4 mg total) by mouth 1 to 3 hours before bedtime. 06/29/22  Yes   rosuvastatin (CRESTOR) 40 MG tablet Take 1 tablet (40 mg total) by mouth at bedtime. 12/25/22  Yes Johnson, Clanford L, MD  topiramate (TOPAMAX) 100 MG tablet TAKE 1 TABLET BY MOUTH TWICE DAILY. 05/12/22  Yes   traZODone (DESYREL) 150 MG tablet Take 1 tablet (150 mg total) by mouth in the morning and at bedtime. 11/16/22  Yes   traZODone (DESYREL) 50 MG tablet Take 1 tablet (50 mg total) by mouth daily. 11/03/22  Yes   acetaminophen (TYLENOL) 500 MG tablet Take 2 tablets by mouth 3 times daily as needed for pain 12/08/21     Dulaglutide (TRULICITY) 1.5 MG/0.5ML SOPN Inject 1.5 mg into the skin once a week. 11/03/22     traMADol (ULTRAM) 50 MG tablet Take 1 tablet (50 mg total) by mouth every 6 (six) hours. Patient taking differently: Take 50 mg by mouth daily as needed for moderate pain. 11/03/22         Allergies    Abilify [aripiprazole], Ciprofloxacin, Carbidopa-levodopa, Prednisone, and Venlafaxine    Review of Systems   Review of Systems  Constitutional:  Negative for chills and fever.  HENT:  Negative for trouble swallowing.   Respiratory:  Negative for cough, chest tightness and shortness of breath.   Cardiovascular:  Negative for chest pain.  Gastrointestinal:  Negative for abdominal pain, diarrhea, nausea and vomiting.  Musculoskeletal:  Positive for myalgias (Bilateral lower extremity swelling, pain right lower extremity).  Skin:  Positive for color change. Negative for wound.  Neurological:  Negative for dizziness, weakness and numbness.    Physical Exam Updated Vital Signs BP 130/60 (BP Location: Right Arm)   Pulse 88   Temp 97.6 F (36.4 C) (Oral)   Resp 18   Ht  (1.6 m)   Wt 64 kg   SpO2 100%   BMI 24.98 kg/m  Physical Exam Vitals and nursing note reviewed.  Constitutional:      General: She is not in acute distress.    Appearance: Normal appearance. She is not  ill-appearing or toxic-appearing.  Cardiovascular:     Rate and Rhythm: Normal rate and regular rhythm.     Pulses: Normal pulses.  Pulmonary:     Effort: Pulmonary effort is normal.  Chest:     Chest wall: No tenderness.  Abdominal:     Palpations: Abdomen is soft.     Tenderness: There is no abdominal tenderness.  Musculoskeletal:        General: Swelling and tenderness present.     Right lower leg: Edema present.     Left lower leg: No edema.     Comments:  Confluent erythema to the right lower extremity.  Edema noted to bilateral lower extremities with right significantly worse than left.  No palpable cord of the right lower extremity.  No tenderness on range of motion of the knee or ankle.  Skin:    General: Skin is warm.     Capillary Refill: Capillary refill takes less than 2 seconds.     Findings: Erythema present. No bruising or rash.  Neurological:     General: No focal deficit present.     Mental Status: She is alert.     Sensory: No sensory deficit.     Motor: No weakness.     ED Results / Procedures / Treatments   Labs (all labs ordered are listed, but only abnormal results are displayed) Labs Reviewed  BASIC METABOLIC PANEL - Abnormal; Notable for the following components:      Result Value   Potassium 3.3 (*)    CO2 21 (*)    Glucose, Bld 109 (*)    Creatinine, Ser 1.70 (*)    Calcium 8.7 (*)    GFR, Estimated 33 (*)    All other components within normal limits  CBC - Abnormal; Notable for the following components:   RBC 3.20 (*)    Hemoglobin 8.3 (*)    HCT 28.0 (*)    MCH 25.9 (*)    MCHC 29.6 (*)    All other components within normal limits  BRAIN NATRIURETIC PEPTIDE - Abnormal; Notable for the following components:   B Natriuretic Peptide 178.0 (*)    All other components within normal limits  CBG MONITORING, ED - Abnormal; Notable for the following components:   Glucose-Capillary 156 (*)    All other components within normal limits  TROPONIN I  (HIGH SENSITIVITY)  TROPONIN I (HIGH SENSITIVITY)    EKG EKG Interpretation  Date/Time:  Monday January 18 2023 14:00:02 EDT Ventricular Rate:  81 PR Interval:  136 QRS Duration: 90 QT Interval:  398 QTC Calculation: 462 R Axis:   78 Text Interpretation: Atrial fibrillation Low voltage, precordial leads Borderline T wave abnormalities Confirmed by Alona Bene [phone]) on 01/19/2023 4:15:17 PM  Radiology US Venous Img Lower Unilateral Right  Result Date: 01/18/2023 CLINICAL DATA:  Right lower extremity pain and edema EXAM: RIGHT LOWER EXTREMITY VENOUS DOPPLER ULTRASOUND TECHNIQUE: Gray-scale sonography with graded compression, as well as color Doppler and duplex ultrasound were performed to evaluate the lower extremity deep venous systems from the level of the common femoral vein and including the common femoral, femoral, profunda femoral, popliteal and calf veins including the posterior tibial, peroneal and gastrocnemius veins when visible. Spectral Doppler was utilized to evaluate flow at rest and with distal augmentation maneuvers in the common femoral, femoral and popliteal veins. COMPARISON:  None Available. FINDINGS: Contralateral Common Femoral Vein: Respiratory phasicity is normal and symmetric with the symptomatic side. No evidence of thrombus. Normal compressibility. Common Femoral Vein: No evidence of thrombus. Normal compressibility, respiratory phasicity and response to augmentation. Saphenofemoral Junction: No evidence of thrombus. Normal compressibility and flow on color Doppler imaging. Profunda Femoral Vein: No evidence of thrombus. Normal compressibility and flow on color Doppler imaging. Femoral Vein: No evidence of thrombus. Normal compressibility, respiratory phasicity and response to augmentation. Popliteal Vein: No evidence of thrombus. Normal compressibility, respiratory phasicity and response to augmentation. Calf Veins: No evidence of thrombus. Normal compressibility and flow  on color Doppler imaging. Other Findings:  Peripheral extremity edema noted. IMPRESSION: No evidence of deep venous thrombosis. Electronically  Signed   By: Judie Petit.  Shick M.D.   On: 01/18/2023 14:54   DG Chest 2 View  Result Date: 01/18/2023 CLINICAL DATA:  Bilateral leg swelling. EXAM: CHEST - 2 VIEW COMPARISON:  12/21/2022. FINDINGS: No consolidation or pulmonary edema. Stable cardiac and mediastinal contours with postoperative changes of median sternotomy and CABG. No pleural effusion or pneumothorax. Visualized bones and upper abdomen unremarkable. IMPRESSION: No evidence of acute cardiopulmonary disease. Electronically Signed   By: Orvan Falconer M.D.   On: 01/18/2023 10:30    Procedures Procedures    Medications Ordered in ED Medications - No data to display  ED Course/ Medical Decision Making/ A&P                             Medical Decision Making Patient here for evaluation of lower extremity swelling x 1 week.  Symptomatic on the right.  Noticed swelling and redness over the right lower extremity.  No known injury.  Denies chest pain or shortness of breath.  No recent travel or prior history of DVT.  Differential would include but not limited to DVT, cellulitis, musculoskeletal injury, peripheral edema, CHF exacerbation septic joint also considered but felt less likely as patient does not have erythema of the joints or joint pain.  Amount and/or Complexity of Data Reviewed Labs: ordered.    Details: Labs interpreted by me, no evidence of leukocytosis, hemoglobin 8.3, this is near her baseline.  BNP mildly elevated 178.  Troponin 4.  Chemistries mild hypokalemia potassium 3.3 serum creatinine improved from baseline at 1.7 Radiology: ordered.    Details: Chest x-ray without evidence of acute cardiopulmonary process, venous imaging of the right lower extremity negative for presence of DVT ECG/medicine tests: ordered.    Details: EKG shows atrial fib without RVR Discussion of  management or test interpretation with external provider(s): Workup today shows likely cellulitis of the right lower extremity.  Ultrasound imaging needed for presence of DVT.  No focal neurodeficits.  No concerning findings for septic joint.  Patient appears appropriate for discharge home will follow-up with PCP  Risk Prescription drug management.           Final Clinical Impression(s) / ED Diagnoses Final diagnoses:  Cellulitis of right lower extremity    Rx / DC Orders ED Discharge Orders          Ordered    cephALEXin (KEFLEX) 500 MG capsule  4 times daily        01/18/23 1741              Pauline Aus, PA-C 01/20/23 1633    Gloris Manchester, MD 01/23/23 1535

## 2023-01-21 ENCOUNTER — Other Ambulatory Visit: Payer: Self-pay | Admitting: Gastroenterology

## 2023-01-21 ENCOUNTER — Ambulatory Visit (HOSPITAL_COMMUNITY): Payer: Medicare Other | Attending: Family Medicine

## 2023-01-21 ENCOUNTER — Ambulatory Visit (HOSPITAL_COMMUNITY)
Admission: RE | Admit: 2023-01-21 | Discharge: 2023-01-21 | Disposition: A | Discharge status: Home / Self Care | Payer: Medicare Other | Source: Ambulatory Visit | Attending: Nephrology | Admitting: Nephrology

## 2023-01-21 ENCOUNTER — Ambulatory Visit (HOSPITAL_COMMUNITY)
Admission: RE | Admit: 2023-01-21 | Discharge: 2023-01-21 | Disposition: A | Discharge status: Home / Self Care | Payer: Medicare Other | Source: Ambulatory Visit | Attending: Gastroenterology | Admitting: Gastroenterology

## 2023-01-21 DIAGNOSIS — D638 Anemia in other chronic diseases classified elsewhere: Secondary | ICD-10-CM

## 2023-01-21 DIAGNOSIS — K5900 Constipation, unspecified: Secondary | ICD-10-CM | POA: Insufficient documentation

## 2023-01-21 DIAGNOSIS — I5032 Chronic diastolic (congestive) heart failure: Secondary | ICD-10-CM | POA: Diagnosis present

## 2023-01-21 DIAGNOSIS — E1122 Type 2 diabetes mellitus with diabetic chronic kidney disease: Secondary | ICD-10-CM | POA: Diagnosis present

## 2023-01-21 DIAGNOSIS — D508 Other iron deficiency anemias: Secondary | ICD-10-CM | POA: Diagnosis present

## 2023-01-21 DIAGNOSIS — I129 Hypertensive chronic kidney disease with stage 1 through stage 4 chronic kidney disease, or unspecified chronic kidney disease: Secondary | ICD-10-CM | POA: Diagnosis present

## 2023-01-21 DIAGNOSIS — G2581 Restless legs syndrome: Secondary | ICD-10-CM | POA: Insufficient documentation

## 2023-01-21 MED ORDER — LINACLOTIDE 145 MCG PO CAPS: 145.0000 ug | ORAL_CAPSULE | Freq: Every day | ORAL | 3 refills | Status: DC

## 2023-01-21 NOTE — Therapy (Signed)
[facility] at [facility] [address] [place], [place], [address] Phone: [phone]   Fax:  [phone]  Patient Details  Name: Meghan Terry MRN: [id number] Date of Birth: 1960/11/25 Referring Provider:  Cleora Fleet, MD  Encounter Date: 01/21/2023   Patient arrives for evaluation with diagnosis of restless leg syndrome; states she has had for 30+ years.  She states she "doesn't know why" she is here.  Patient does have some cellulitis but is getting around just fine; having some kidney issues she is dealing with but otherwise no issue with mobility; PT screen only.  Patient does not have any physical therapy needs at this time.  Issued a general lower extremity program and she is an active member at the [facility].  Instructed patient to call her MD if she has any further needs.      9:17 AM, 01/21/23 [name] MPT [facility] [place] [phone]   [facility] at [facility] [address] [place], [place], [address] Phone: [phone]   Fax:  [phone]

## 2023-01-25 ENCOUNTER — Other Ambulatory Visit: Payer: Self-pay

## 2023-01-25 ENCOUNTER — Encounter (HOSPITAL_COMMUNITY): Payer: Self-pay | Admitting: *Deleted

## 2023-01-25 ENCOUNTER — Encounter: Payer: Self-pay | Admitting: Internal Medicine

## 2023-01-25 ENCOUNTER — Emergency Department (HOSPITAL_COMMUNITY): Payer: Medicare Other

## 2023-01-25 ENCOUNTER — Inpatient Hospital Stay (HOSPITAL_COMMUNITY)
Admission: EM | Admit: 2023-01-25 | Discharge: 2023-01-27 | DRG: 291 | Disposition: A | Discharge status: Home / Self Care | Payer: Medicare Other | Attending: Internal Medicine | Admitting: Internal Medicine

## 2023-01-25 ENCOUNTER — Ambulatory Visit: Payer: Medicare Other | Attending: Internal Medicine | Admitting: Internal Medicine

## 2023-01-25 VITALS — BP 120/60 | HR 86 | Ht 63.0 in | Wt 146.0 lb

## 2023-01-25 DIAGNOSIS — Z7982 Long term (current) use of aspirin: Secondary | ICD-10-CM

## 2023-01-25 DIAGNOSIS — N184 Chronic kidney disease, stage 4 (severe): Secondary | ICD-10-CM | POA: Diagnosis present

## 2023-01-25 DIAGNOSIS — I129 Hypertensive chronic kidney disease with stage 1 through stage 4 chronic kidney disease, or unspecified chronic kidney disease: Secondary | ICD-10-CM | POA: Diagnosis not present

## 2023-01-25 DIAGNOSIS — Z79890 Hormone replacement therapy (postmenopausal): Secondary | ICD-10-CM | POA: Diagnosis not present

## 2023-01-25 DIAGNOSIS — E876 Hypokalemia: Secondary | ICD-10-CM | POA: Diagnosis present

## 2023-01-25 DIAGNOSIS — F32A Depression, unspecified: Secondary | ICD-10-CM | POA: Diagnosis present

## 2023-01-25 DIAGNOSIS — I25119 Atherosclerotic heart disease of native coronary artery with unspecified angina pectoris: Secondary | ICD-10-CM | POA: Diagnosis present

## 2023-01-25 DIAGNOSIS — Z79899 Other long term (current) drug therapy: Secondary | ICD-10-CM

## 2023-01-25 DIAGNOSIS — Z90710 Acquired absence of both cervix and uterus: Secondary | ICD-10-CM

## 2023-01-25 DIAGNOSIS — I130 Hypertensive heart and chronic kidney disease with heart failure and stage 1 through stage 4 chronic kidney disease, or unspecified chronic kidney disease: Secondary | ICD-10-CM | POA: Diagnosis not present

## 2023-01-25 DIAGNOSIS — E088 Diabetes mellitus due to underlying condition with unspecified complications: Secondary | ICD-10-CM | POA: Diagnosis not present

## 2023-01-25 DIAGNOSIS — Z66 Do not resuscitate: Secondary | ICD-10-CM | POA: Diagnosis present

## 2023-01-25 DIAGNOSIS — Z8659 Personal history of other mental and behavioral disorders: Secondary | ICD-10-CM | POA: Diagnosis not present

## 2023-01-25 DIAGNOSIS — Z8249 Family history of ischemic heart disease and other diseases of the circulatory system: Secondary | ICD-10-CM | POA: Diagnosis not present

## 2023-01-25 DIAGNOSIS — Z955 Presence of coronary angioplasty implant and graft: Secondary | ICD-10-CM

## 2023-01-25 DIAGNOSIS — Z951 Presence of aortocoronary bypass graft: Secondary | ICD-10-CM

## 2023-01-25 DIAGNOSIS — D631 Anemia in chronic kidney disease: Secondary | ICD-10-CM | POA: Diagnosis present

## 2023-01-25 DIAGNOSIS — M7989 Other specified soft tissue disorders: Secondary | ICD-10-CM | POA: Diagnosis present

## 2023-01-25 DIAGNOSIS — N1832 Chronic kidney disease, stage 3b: Secondary | ICD-10-CM

## 2023-01-25 DIAGNOSIS — Z881 Allergy status to other antibiotic agents status: Secondary | ICD-10-CM

## 2023-01-25 DIAGNOSIS — E785 Hyperlipidemia, unspecified: Secondary | ICD-10-CM | POA: Diagnosis present

## 2023-01-25 DIAGNOSIS — I25118 Atherosclerotic heart disease of native coronary artery with other forms of angina pectoris: Secondary | ICD-10-CM | POA: Diagnosis not present

## 2023-01-25 DIAGNOSIS — F419 Anxiety disorder, unspecified: Secondary | ICD-10-CM | POA: Diagnosis present

## 2023-01-25 DIAGNOSIS — Z833 Family history of diabetes mellitus: Secondary | ICD-10-CM | POA: Diagnosis not present

## 2023-01-25 DIAGNOSIS — Z1152 Encounter for screening for COVID-19: Secondary | ICD-10-CM | POA: Diagnosis not present

## 2023-01-25 DIAGNOSIS — Z888 Allergy status to other drugs, medicaments and biological substances status: Secondary | ICD-10-CM

## 2023-01-25 DIAGNOSIS — Z9851 Tubal ligation status: Secondary | ICD-10-CM

## 2023-01-25 DIAGNOSIS — Z5986 Financial insecurity: Secondary | ICD-10-CM

## 2023-01-25 DIAGNOSIS — I509 Heart failure, unspecified: Principal | ICD-10-CM

## 2023-01-25 DIAGNOSIS — I5033 Acute on chronic diastolic (congestive) heart failure: Secondary | ICD-10-CM | POA: Diagnosis present

## 2023-01-25 DIAGNOSIS — I2510 Atherosclerotic heart disease of native coronary artery without angina pectoris: Secondary | ICD-10-CM | POA: Diagnosis not present

## 2023-01-25 DIAGNOSIS — I5031 Acute diastolic (congestive) heart failure: Secondary | ICD-10-CM

## 2023-01-25 DIAGNOSIS — E1122 Type 2 diabetes mellitus with diabetic chronic kidney disease: Secondary | ICD-10-CM | POA: Diagnosis present

## 2023-01-25 DIAGNOSIS — Z8719 Personal history of other diseases of the digestive system: Secondary | ICD-10-CM

## 2023-01-25 DIAGNOSIS — Z7902 Long term (current) use of antithrombotics/antiplatelets: Secondary | ICD-10-CM | POA: Diagnosis not present

## 2023-01-25 DIAGNOSIS — E039 Hypothyroidism, unspecified: Secondary | ICD-10-CM | POA: Diagnosis present

## 2023-01-25 DIAGNOSIS — G2581 Restless legs syndrome: Secondary | ICD-10-CM | POA: Diagnosis present

## 2023-01-25 LAB — CBC WITH DIFFERENTIAL/PLATELET
Abs Immature Granulocytes: 0.03 10*3/uL (ref 0.00–0.07)
Basophils Absolute: 0 10*3/uL (ref 0.0–0.1)
Basophils Relative: 0 %
Eosinophils Absolute: 0.2 10*3/uL (ref 0.0–0.5)
Eosinophils Relative: 2 %
HCT: 28.5 % — ABNORMAL LOW (ref 36.0–46.0)
Hemoglobin: 8.6 g/dL — ABNORMAL LOW (ref 12.0–15.0)
Immature Granulocytes: 1 %
Lymphocytes Relative: 22 %
Lymphs Abs: 1.5 10*3/uL (ref 0.7–4.0)
MCH: 27.3 pg (ref 26.0–34.0)
MCHC: 30.2 g/dL (ref 30.0–36.0)
MCV: 90.5 fL (ref 80.0–100.0)
Monocytes Absolute: 0.4 10*3/uL (ref 0.1–1.0)
Monocytes Relative: 7 %
Neutro Abs: 4.5 10*3/uL (ref 1.7–7.7)
Neutrophils Relative %: 68 %
Platelets: 293 10*3/uL (ref 150–400)
RBC: 3.15 MIL/uL — ABNORMAL LOW (ref 3.87–5.11)
WBC: 6.6 10*3/uL (ref 4.0–10.5)
nRBC: 0 % (ref 0.0–0.2)

## 2023-01-25 LAB — RESP PANEL BY RT-PCR (RSV, FLU A&B, COVID)  RVPGX2
Influenza A by PCR: NEGATIVE
Influenza B by PCR: NEGATIVE
Resp Syncytial Virus by PCR: NEGATIVE
SARS Coronavirus 2 by RT PCR: NEGATIVE

## 2023-01-25 LAB — URINALYSIS, ROUTINE W REFLEX MICROSCOPIC
Bacteria, UA: NONE SEEN
Bilirubin Urine: NEGATIVE
Glucose, UA: >=500 mg/dL — AB
Hgb urine dipstick: NEGATIVE
Ketones, ur: NEGATIVE mg/dL
Leukocytes,Ua: NEGATIVE
Nitrite: NEGATIVE
Protein, ur: NEGATIVE mg/dL
Specific Gravity, Urine: 1.006 (ref 1.005–1.030)
pH: 7 (ref 5.0–8.0)

## 2023-01-25 LAB — BLOOD GAS, VENOUS
Acid-base deficit: 0.6 mmol/L (ref 0.0–2.0)
Bicarbonate: 25.4 mmol/L (ref 20.0–28.0)
Drawn by: 23766
O2 Saturation: 37.5 %
Patient temperature: 36.6
pCO2, Ven: 46 mmHg (ref 44–60)
pH, Ven: 7.35 (ref 7.25–7.43)
pO2, Ven: <31 mmHg — CL (ref 32–45)

## 2023-01-25 LAB — COMPREHENSIVE METABOLIC PANEL
ALT: 11 U/L (ref 0–44)
AST: 14 U/L — ABNORMAL LOW (ref 15–41)
Albumin: 3.3 g/dL — ABNORMAL LOW (ref 3.5–5.0)
Alkaline Phosphatase: 64 U/L (ref 38–126)
Anion gap: 8 (ref 5–15)
BUN: 18 mg/dL (ref 8–23)
CO2: 24 mmol/L (ref 22–32)
Calcium: 8.7 mg/dL — ABNORMAL LOW (ref 8.9–10.3)
Chloride: 104 mmol/L (ref 98–111)
Creatinine, Ser: 1.97 mg/dL — ABNORMAL HIGH (ref 0.44–1.00)
GFR, Estimated: 28 mL/min — ABNORMAL LOW (ref >60)
Glucose, Bld: 125 mg/dL — ABNORMAL HIGH (ref 70–99)
Potassium: 3.2 mmol/L — ABNORMAL LOW (ref 3.5–5.1)
Sodium: 136 mmol/L (ref 135–145)
Total Bilirubin: 0.4 mg/dL (ref 0.3–1.2)
Total Protein: 6.6 g/dL (ref 6.5–8.1)

## 2023-01-25 LAB — MAGNESIUM: Magnesium: 2.3 mg/dL (ref 1.7–2.4)

## 2023-01-25 LAB — BRAIN NATRIURETIC PEPTIDE: B Natriuretic Peptide: 243 pg/mL — ABNORMAL HIGH (ref 0.0–100.0)

## 2023-01-25 MED ORDER — ONDANSETRON HCL 4 MG PO TABS: 4.0000 mg | ORAL_TABLET | Freq: Four times a day (QID) | ORAL | Status: DC | PRN

## 2023-01-25 MED ORDER — POTASSIUM CHLORIDE CRYS ER 20 MEQ PO TBCR
40.0000 meq | EXTENDED_RELEASE_TABLET | Freq: Once | ORAL | Status: AC
  Administered 2023-01-25: 40 meq via ORAL
  Filled 2023-01-25: qty 2

## 2023-01-25 MED ORDER — FUROSEMIDE 10 MG/ML IJ SOLN
60.0000 mg | Freq: Two times a day (BID) | INTRAMUSCULAR | Status: DC
  Administered 2023-01-25 – 2023-01-26 (×3): 60 mg via INTRAVENOUS
  Filled 2023-01-25 (×3): qty 6

## 2023-01-25 MED ORDER — LEVOTHYROXINE SODIUM 50 MCG PO TABS
50.0000 ug | ORAL_TABLET | Freq: Every day | ORAL | Status: DC
  Administered 2023-01-26 – 2023-01-27 (×2): 50 ug via ORAL
  Filled 2023-01-25 (×2): qty 1

## 2023-01-25 MED ORDER — POLYETHYLENE GLYCOL 3350 17 G PO PACK: 17.0000 g | PACK | Freq: Every day | ORAL | Status: DC | PRN

## 2023-01-25 MED ORDER — ONDANSETRON HCL 4 MG/2ML IJ SOLN: 4.0000 mg | Freq: Four times a day (QID) | INTRAMUSCULAR | Status: DC | PRN

## 2023-01-25 MED ORDER — ROSUVASTATIN CALCIUM 20 MG PO TABS
40.0000 mg | ORAL_TABLET | Freq: Every day | ORAL | Status: DC
  Administered 2023-01-25 – 2023-01-26 (×2): 40 mg via ORAL
  Filled 2023-01-25 (×2): qty 2

## 2023-01-25 MED ORDER — METOPROLOL SUCCINATE ER 25 MG PO TB24
12.5000 mg | ORAL_TABLET | Freq: Every day | ORAL | Status: DC
  Administered 2023-01-26: 12.5 mg via ORAL
  Filled 2023-01-25 (×3): qty 1

## 2023-01-25 MED ORDER — ENOXAPARIN SODIUM 30 MG/0.3ML IJ SOSY
30.0000 mg | PREFILLED_SYRINGE | INTRAMUSCULAR | Status: DC
  Administered 2023-01-25 – 2023-01-26 (×2): 30 mg via SUBCUTANEOUS
  Filled 2023-01-25 (×2): qty 0.3

## 2023-01-25 MED ORDER — PANTOPRAZOLE SODIUM 40 MG PO TBEC
40.0000 mg | DELAYED_RELEASE_TABLET | Freq: Two times a day (BID) | ORAL | Status: DC
  Administered 2023-01-25 – 2023-01-27 (×4): 40 mg via ORAL
  Filled 2023-01-25 (×4): qty 1

## 2023-01-25 MED ORDER — NITROGLYCERIN 0.4 MG SL SUBL: 0.4000 mg | SUBLINGUAL_TABLET | SUBLINGUAL | Status: DC | PRN

## 2023-01-25 MED ORDER — FUROSEMIDE 10 MG/ML IJ SOLN
60.0000 mg | Freq: Once | INTRAMUSCULAR | Status: AC
  Administered 2023-01-25: 60 mg via INTRAVENOUS
  Filled 2023-01-25: qty 6

## 2023-01-25 MED ORDER — TRAZODONE HCL 50 MG PO TABS
150.0000 mg | ORAL_TABLET | Freq: Every evening | ORAL | Status: DC | PRN
  Administered 2023-01-25 – 2023-01-26 (×2): 150 mg via ORAL
  Filled 2023-01-25 (×2): qty 3

## 2023-01-25 MED ORDER — ROPINIROLE HCL 1 MG PO TABS
1.0000 mg | ORAL_TABLET | Freq: Once | ORAL | Status: AC
  Administered 2023-01-25: 1 mg via ORAL
  Filled 2023-01-25: qty 1

## 2023-01-25 MED ORDER — TRAMADOL HCL 50 MG PO TABS
50.0000 mg | ORAL_TABLET | Freq: Every day | ORAL | Status: DC | PRN
  Administered 2023-01-25 – 2023-01-26 (×2): 50 mg via ORAL
  Filled 2023-01-25 (×2): qty 1

## 2023-01-25 MED ORDER — ASPIRIN 81 MG PO TBEC
81.0000 mg | DELAYED_RELEASE_TABLET | Freq: Every day | ORAL | Status: DC
  Administered 2023-01-26 – 2023-01-27 (×2): 81 mg via ORAL
  Filled 2023-01-25 (×2): qty 1

## 2023-01-25 MED ORDER — RANOLAZINE ER 500 MG PO TB12
500.0000 mg | ORAL_TABLET | Freq: Two times a day (BID) | ORAL | Status: DC
  Administered 2023-01-25 – 2023-01-26 (×2): 500 mg via ORAL
  Filled 2023-01-25 (×2): qty 1

## 2023-01-25 MED ORDER — ACETAMINOPHEN 650 MG RE SUPP: 650.0000 mg | Freq: Four times a day (QID) | RECTAL | Status: DC | PRN

## 2023-01-25 MED ORDER — DAPAGLIFLOZIN PROPANEDIOL 10 MG PO TABS
10.0000 mg | ORAL_TABLET | Freq: Every day | ORAL | Status: DC
  Administered 2023-01-26 – 2023-01-27 (×2): 10 mg via ORAL
  Filled 2023-01-25 (×5): qty 1

## 2023-01-25 MED ORDER — ACETAMINOPHEN 325 MG PO TABS
650.0000 mg | ORAL_TABLET | Freq: Four times a day (QID) | ORAL | Status: DC | PRN
  Administered 2023-01-26 – 2023-01-27 (×2): 650 mg via ORAL
  Filled 2023-01-25 (×2): qty 2

## 2023-01-25 NOTE — Assessment & Plan Note (Signed)
Creatinine 1.97.  At baseline. - Monitor

## 2023-01-25 NOTE — Assessment & Plan Note (Signed)
Hemoglobin stable at 8.6.

## 2023-01-25 NOTE — ED Provider Notes (Signed)
[place] EMERGENCY DEPARTMENT AT [facility] Provider Note   CSN: [id number] Arrival date & time: 01/25/23  1347     History  Chief Complaint  Patient presents with   Shortness of Breath    Meghan Terry is a 62 y.o. female.   Shortness of Breath Patient presents for exertional dyspnea.  Medical history includes CAD, HLD, CKD, CHF, migraines, DM, anxiety, HTN.  Prior to arrival, patient had a routine visit with cardiologist.  She endorsed recent dyspnea on exertion, orthopnea, cough, bilateral leg swelling over the past 2 weeks.  She has been taking oral Lasix at home.  Cardiologist feels that she failed outpatient therapy of her CHF and would like her admitted for IV diuresis.  Currently, at rest, patient denies any complaints.  She states that her shortness of breath is only present with any exertion.     Home Medications Prior to Admission medications   Medication Sig Start Date End Date Taking? Authorizing Provider  acetaminophen (TYLENOL) 500 MG tablet Take 2 tablets by mouth 3 times daily as needed for pain 12/08/21     aspirin EC 81 MG tablet Take 1 tablet (81 mg total) by mouth daily. 09/05/19   Revankar, Aundra Dubin, MD  buPROPion (WELLBUTRIN XL) 300 MG 24 hr tablet Take 1 tablet (300 mg total) by mouth daily. 07/06/22     cholecalciferol 25 MCG (1000 UT) tablet Take 1 tablet (1,000 Units total) by mouth in the morning and at bedtime. 12/25/22   Johnson, Clanford L, MD  clopidogrel (PLAVIX) 75 MG tablet TAKE 1 TABLET BY MOUTH DAILY Patient taking differently: Take 75 mg by mouth daily. 03/30/22   Revankar, Aundra Dubin, MD  cyanocobalamin (VITAMIN B12) 1000 MCG tablet Take 1 tablet (1,000 mcg total) by mouth daily. 12/25/22   Johnson, Clanford L, MD  dapagliflozin propanediol (FARXIGA) 10 MG TABS tablet Take 1 tablet (10 mg total) by mouth daily before breakfast. 12/26/22   Laural Benes, Clanford L, MD  ferrous sulfate 325 (65 FE) MG tablet Take 1 tablet (325 mg total) by mouth 2  (two) times daily with a meal. 12/25/22   Johnson, Clanford L, MD  folic acid (FOLVITE) 1 MG tablet Take 1 tablet (1 mg total) by mouth daily. 12/26/22   Johnson, Clanford L, MD  furosemide (LASIX) 40 MG tablet Take 1.5 tablets (60 mg total) by mouth daily. 12/26/22   Johnson, Clanford L, MD  levothyroxine (SYNTHROID) 50 MCG tablet TAKE 1 TABLET BY MOUTH ONCE DAILY ON AN EMPTY STOMACH 30 MINUTES BEFORE BREAKFAST ON SAT AND SUNDAY Patient taking differently: Take 50 mcg by mouth See admin instructions. Take 1 tablet in the morning with breakfast on SATURDAY AND SUNDAY. 05/28/22     levothyroxine (SYNTHROID) 75 MCG tablet Take 1 tablet (75 mcg total) by mouth daily Monday thru Friday. 06/08/22     linaclotide (LINZESS) 145 MCG CAPS capsule Take 1 capsule (145 mcg total) by mouth daily before breakfast. 01/21/23   Aida Raider, NP  metoprolol succinate (TOPROL-XL) 25 MG 24 hr tablet Take 0.5 tablets (12.5 mg total) by mouth at bedtime. Take with or immediately following a meal. 12/25/22   Johnson, Clanford L, MD  nitroGLYCERIN (NITROSTAT) 0.4 MG SL tablet Place 1 tablet (0.4 mg total) under the tongue every 5 (five) minutes as needed for chest pain. 12/29/21   Revankar, Aundra Dubin, MD  ondansetron (ZOFRAN) 4 MG tablet take 1 tablet (4 mg) by mouth 2 times per day as needed  for nausea Patient taking differently: Take 4 mg by mouth 2 (two) times daily as needed for nausea or vomiting. 05/12/22     pantoprazole (PROTONIX) 40 MG tablet Take 1 tablet (40 mg total) by mouth 2 (two) times daily. 12/25/22   Johnson, Clanford L, MD  promethazine (PHENERGAN) 25 MG tablet Take 1 tablet (25 mg total) by mouth every 6 (six) hours as needed for nausea or vomiting. 12/25/22   Standley Dakins L, MD  ranolazine (RANEXA) 500 MG 12 hr tablet Take 1 tablet by mouth 2 times daily 12/25/21   Revankar, Aundra Dubin, MD  rOPINIRole (REQUIP) 1 MG tablet Take 1 tablet (1 mg total) by mouth 3 (three) times daily. 11/12/22     rOPINIRole (REQUIP)  4 MG tablet Take 1 tablet (4 mg total) by mouth 1 to 3 hours before bedtime. 06/29/22     rosuvastatin (CRESTOR) 40 MG tablet Take 1 tablet (40 mg total) by mouth at bedtime. 12/25/22   Johnson, Clanford L, MD  topiramate (TOPAMAX) 100 MG tablet TAKE 1 TABLET BY MOUTH TWICE DAILY. 05/12/22     traMADol (ULTRAM) 50 MG tablet Take 1 tablet (50 mg total) by mouth every 6 (six) hours. Patient taking differently: Take 50 mg by mouth daily as needed for moderate pain. 11/03/22     traZODone (DESYREL) 150 MG tablet Take 1 tablet (150 mg total) by mouth in the morning and at bedtime. 11/16/22     traZODone (DESYREL) 50 MG tablet Take 1 tablet (50 mg total) by mouth daily. 11/03/22         Allergies    Abilify [aripiprazole], Ciprofloxacin, Carbidopa-levodopa, Prednisone, and Venlafaxine    Review of Systems   Review of Systems  Constitutional:  Positive for fatigue.  Respiratory:  Positive for shortness of breath.   Cardiovascular:  Positive for leg swelling.  All other systems reviewed and are negative.   Physical Exam Updated Vital Signs BP (!) 89/79 (BP Location: Right Arm)   Pulse 92   Temp 98.2 F (36.8 C) (Oral)   Resp 18   Ht 5\' 3"  (1.6 m)   Wt 66.2 kg   SpO2 100%   BMI 25.86 kg/m  Physical Exam Vitals and nursing note reviewed.  Constitutional:      General: She is not in acute distress.    Appearance: She is well-developed. She is not ill-appearing, toxic-appearing or diaphoretic.  HENT:     Head: Normocephalic and atraumatic.     Mouth/Throat:     Mouth: Mucous membranes are moist.  Eyes:     Conjunctiva/sclera: Conjunctivae normal.  Cardiovascular:     Rate and Rhythm: Normal rate and regular rhythm.     Heart sounds: No murmur heard. Pulmonary:     Effort: Pulmonary effort is normal. No tachypnea or respiratory distress.     Breath sounds: Rales present. No wheezing or rhonchi.  Chest:     Chest wall: No tenderness.  Abdominal:     Palpations: Abdomen is soft.      Tenderness: There is no abdominal tenderness.  Musculoskeletal:        General: No swelling. Normal range of motion.     Cervical back: Normal range of motion and neck supple.     Right lower leg: Edema present.     Left lower leg: Edema present.  Skin:    General: Skin is warm and dry.  Neurological:     General: No focal deficit present.  Mental Status: She is alert.  Psychiatric:        Mood and Affect: Mood normal.        Behavior: Behavior normal.     ED Results / Procedures / Treatments   Labs (all labs ordered are listed, but only abnormal results are displayed) Labs Reviewed  COMPREHENSIVE METABOLIC PANEL - Abnormal; Notable for the following components:      Result Value   Potassium 3.2 (*)    Glucose, Bld 125 (*)    Creatinine, Ser 1.97 (*)    Calcium 8.7 (*)    Albumin 3.3 (*)    AST 14 (*)    GFR, Estimated 28 (*)    All other components within normal limits  BRAIN NATRIURETIC PEPTIDE - Abnormal; Notable for the following components:   B Natriuretic Peptide 243.0 (*)    All other components within normal limits  BLOOD GAS, VENOUS - Abnormal; Notable for the following components:   pO2, Ven <31 (*)    All other components within normal limits  CBC WITH DIFFERENTIAL/PLATELET - Abnormal; Notable for the following components:   RBC 3.15 (*)    Hemoglobin 8.6 (*)    HCT 28.5 (*)    All other components within normal limits  RESP PANEL BY RT-PCR (RSV, FLU A&B, COVID)  RVPGX2  MAGNESIUM  URINALYSIS, ROUTINE W REFLEX MICROSCOPIC    EKG None  Radiology DG Chest Port 1 View  Result Date: 01/25/2023 CLINICAL DATA:  Exertional dyspnea EXAM: PORTABLE CHEST 1 VIEW COMPARISON:  CXR 01/18/23 FINDINGS: No pleural effusion. No pneumothorax. No focal airspace opacity. Normal cardiac and mediastinal contours. Status post median sternotomy CABG. No radiographically apparent displaced rib fractures. Visualized upper abdomen is notable for surgical clips in the right upper  quadrant. IMPRESSION: No focal airspace opacity. Electronically Signed   By: Lorenza Cambridge M.D.   On: 01/25/2023 15:30    Procedures Procedures    Medications Ordered in ED Medications  furosemide (LASIX) injection 60 mg (has no administration in time range)  dapagliflozin propanediol (FARXIGA) tablet 10 mg (10 mg Oral Not Given 01/25/23 1625)  furosemide (LASIX) injection 60 mg (60 mg Intravenous Given 01/25/23 1616)  potassium chloride SA (KLOR-CON M) CR tablet 40 mEq (40 mEq Oral Given 01/25/23 1614)    ED Course/ Medical Decision Making/ A&P                             Medical Decision Making Amount and/or Complexity of Data Reviewed Labs: ordered. Radiology: ordered.  Risk Prescription drug management.   This patient presents to the ED for concern of shortness of breath, this involves an extensive number of treatment options, and is a complaint that carries with it a high risk of complications and morbidity.  The differential diagnosis includes CHF, pneumonia, anemia, reactive airway disease   Co morbidities that complicate the patient evaluation  CAD, HLD, CKD, CHF, migraines, DM, anxiety, HTN   Additional history obtained:  Additional history obtained from cardiologist External records from outside source obtained and reviewed including EMR   Lab Tests:  I Ordered, and personally interpreted labs.  The pertinent results include: Baseline anemia, moderate elevation in BNP, baseline CKD, mild hypokalemia   Imaging Studies ordered:  I ordered imaging studies including chest x-ray I independently visualized and interpreted imaging which showed no acute findings I agree with the radiologist interpretation   Cardiac Monitoring: / EKG:  The patient was maintained  on a cardiac monitor.  I personally viewed and interpreted the cardiac monitored which showed an underlying rhythm of: Sinus rhythm  Problem List / ED Course / Critical interventions / Medication  management  Patient presents for exertional dyspnea.  She was seen by cardiologist prior to arrival.  Cardiologist feels that she has failed her outpatient diuretic medication.  Plan is for mission for IV diuresis.  On arrival, patient does not have increased work of breathing at rest.  She denies any current symptoms at rest.  She does have faint crackles on lung auscultation as well as bilateral lower extremity edema.  Diagnostic workup was initiated.  IV Lasix was ordered.  Lab was notable for hypokalemia.  Replacement potassium was ordered.  Care patient was signed out to oncoming ED provider. I ordered medication including Lasix for diuresis Reevaluation of the patient after these medicines showed that the patient improved I have reviewed the patients home medicines and have made adjustments as needed   Social Determinants of Health:  Has access to outpatient care        Final Clinical Impression(s) / ED Diagnoses Final diagnoses:  Acute on chronic congestive heart failure, unspecified heart failure type [facility])    Rx / DC Orders ED Discharge Orders     None         Gloris Manchester, MD 01/25/23 [phone]

## 2023-01-25 NOTE — ED Provider Notes (Signed)
Patient care transferred to me.  Patient has significant bilateral lower extremity edema as well as some dyspnea.  She is in no distress.  Cardiology is recommending 60 mg IV Lasix twice daily.  First dose has been given.  Her anemia is stable compared to baseline and she was given some potassium.  Will admit. Dr. Mariea Clonts to admit.   Pricilla Loveless, MD 01/25/23 [phone]

## 2023-01-25 NOTE — Progress Notes (Signed)
Patient requested one of her home medications(ReQuip). Message sent to on call MD.

## 2023-01-25 NOTE — Progress Notes (Signed)
Patient again requesting her home medication  (ReQuip).  Message sent to on call MD. No new orders. Charge RN aware.

## 2023-01-25 NOTE — Patient Instructions (Signed)
Medication Instructions:  Your physician recommends that you continue on your current medications as directed. Please refer to the Current Medication list given to you today.  *If you need a refill on your cardiac medications before your next appointment, please call your pharmacy*   Lab Work: None If you have labs (blood work) drawn today and your tests are completely normal, you will receive your results only by: MyChart Message (if you have MyChart) OR A paper copy in the mail If you have any lab test that is abnormal or we need to change your treatment, we will call you to review the results.   Testing/Procedures: None   Follow-Up: At [facility], you and your health needs are our priority.  As part of our continuing mission to provide you with exceptional heart care, we have created designated Provider Care Teams.  These Care Teams include your primary Cardiologist (physician) and Advanced Practice Providers (APPs -  Physician Assistants and Nurse Practitioners) who all work together to provide you with the care you need, when you need it.  We recommend signing up for the patient portal called "MyChart".  Sign up information is provided on this After Visit Summary.  MyChart is used to connect with patients for Virtual Visits (Telemedicine).  Patients are able to view lab/test results, encounter notes, upcoming appointments, etc.  Non-urgent messages can be sent to your provider as well.   To learn more about what you can do with MyChart, go to [email or web address].    Your next appointment:   1 month(s)  Provider:   You will see one of the following Advanced Practice Providers on your designated Care Team:   Randall An, PA-C  Jacolyn Reedy, PA-C    3 month follow up with Dr. Jenene Slicker   Other Instructions

## 2023-01-25 NOTE — H&P (Signed)
History and Physical    Meghan Terry:[id number] DOB: 10-09-1960 DOA: 01/25/2023  PCP: Benita Stabile, MD   Patient coming from: Home  I have personally briefly reviewed patient's old medical records in [facility]  Chief Complaint: Legs swelling  HPI: Meghan Terry is a 62 y.o. female with medical history significant for diastolic CHF, CKD stage IV, hypertension, CABG, diabetes mellitus, hypertension and depression. She was referred to the ED from her cardiologist office for admission with the complaints of bilateral lower extremity swelling the past 2 to 3 weeks.  Her dose of Lasix was increased to 80 mg daily which she took for 5 days without significant improvement in swelling.  She reports difficulty breathing with exertion only..  About 10 pound weight gain in the past week.  She has chronic productive cough-at this time is not significantly changed. She has chronic intermittent chest pain that is unchanged from baseline for which she takes as needed nitro that helps.  Last chest pain was earlier today, lasted about couple of minutes, she did not need to take nitro, lower midsternal, per patient-difficult to describe character of pain.  ED Course: Stable vitals.  BNP 243 increased from baseline.  Potassium 3.2. Chest Xray- Negative for acute abnormality. 60 mg IV Lasix given.  Review of Systems: As per HPI all other systems reviewed and negative.  Past Medical History:  Diagnosis Date   Acute diastolic CHF (congestive heart failure) (HCC) 06/08/2021   Acute pulmonary edema (HCC)    Anemia due to stage 4 chronic kidney disease (HCC) 07/20/2022   Angina pectoris (HCC) 11/17/2019   Anxiety    Atypical chest pain 12/30/2020   Benign hypertension with CKD (chronic kidney disease) stage IV (HCC) 07/20/2022   CHF (congestive heart failure) (HCC) 06/08/2021   CKD (chronic kidney disease) stage 3b, GFR 30-59 ml/min 12/03/2019   Coronary artery disease    Coronary artery disease  involving native coronary artery of native heart with angina pectoris (HCC) 10/03/2019   Depression 04/25/2017   Diabetes mellitus due to underlying condition with unspecified complications (HCC) 09/05/2019   Diabetes mellitus with stage 4 chronic kidney disease GFR 15-29 (HCC) 07/20/2022   Essential hypertension 09/05/2019   Ex-smoker 09/05/2019   Family history of coronary artery disease 09/05/2019   History of depression 04/15/2020   Hx of diabetes mellitus 04/15/2020   Hyperlipidemia with target LDL less than 70 11/24/2019   Hyperphosphatemia 07/20/2022   Hypertension    Hypokalemia 04/30/2022   Hypothyroid    Migraine 04/25/2017   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   Syncope and collapse 04/29/2022   Thyroid disease    UTI (urinary tract infection) 04/30/2022    Past Surgical History:  Procedure Laterality Date   ABDOMINAL HYSTERECTOMY     APPENDECTOMY     BIOPSY  12/23/2022   Procedure: BIOPSY;  Surgeon: Corbin Ade, MD;  [facility]: [facility];  Service: Endoscopy;;   CARDIAC CATHETERIZATION  11/22/2019   CHOLECYSTECTOMY     COLONOSCOPY     COLONOSCOPY WITH PROPOFOL N/A 12/25/2022   Procedure: COLONOSCOPY WITH PROPOFOL;  Surgeon: Dolores Frame, MD;  Location:  [facility];  Service: Gastroenterology;  Laterality: N/A;   CORONARY ARTERY BYPASS GRAFT N/A 09/15/2019   Procedure: CORONARY ARTERY BYPASS GRAFTING (CABG) times three on pump using left internal mammary artery and right and left greater saphenous veins harvested endoscopically;  Surgeon: Alleen Borne, MD;  Location: [facility] OR;  Service: Open Heart Surgery;  Laterality: N/A;   CORONARY STENT  INTERVENTION N/A 11/23/2019   Procedure: CORONARY STENT INTERVENTION;  Surgeon: Lennette Bihari, MD;  Location: [facility];  Service: Cardiovascular;  Laterality: N/A;   ESOPHAGEAL DILATION N/A 12/23/2022   Procedure: ESOPHAGEAL DILATION;  Surgeon: Corbin Ade, MD;  Location: [facility];  Service: Endoscopy;  Laterality: N/A;   ESOPHAGOGASTRODUODENOSCOPY (EGD) WITH PROPOFOL N/A 12/23/2022   Procedure: ESOPHAGOGASTRODUODENOSCOPY (EGD) WITH PROPOFOL;  Surgeon: Corbin Ade, MD;  Location: [facility];  Service: Endoscopy;  Laterality: N/A;   KNEE ARTHROSCOPY     LEFT HEART CATH AND CORONARY ANGIOGRAPHY N/A 09/14/2019   Procedure: LEFT HEART CATH AND CORONARY ANGIOGRAPHY;  Surgeon: Corky Crafts, MD;  Location: [facility] INVASIVE CV LAB;  Service: Cardiovascular;  Laterality: N/A;   LEFT HEART CATH AND CORS/GRAFTS ANGIOGRAPHY N/A 11/22/2019   Procedure: LEFT HEART CATH AND CORS/GRAFTS ANGIOGRAPHY;  Surgeon: Lennette Bihari, MD;  Location: [facility];  Service: Cardiovascular;  Laterality: N/A;   LEFT HEART CATH AND CORS/GRAFTS ANGIOGRAPHY N/A 03/13/2020   Procedure: LEFT HEART CATH AND CORS/GRAFTS ANGIOGRAPHY;  Surgeon: Runell Gess, MD;  Location: [facility];  Service: Cardiovascular;  Laterality: N/A;   LEFT HEART CATH AND CORS/GRAFTS ANGIOGRAPHY N/A 12/31/2020   Procedure: LEFT HEART CATH AND CORS/GRAFTS ANGIOGRAPHY;  Surgeon: Lyn Records, MD;  Location: [facility];  Service: Cardiovascular;  Laterality: N/A;   OSTEOCHONDRAL DEFECT REPAIR/RECONSTRUCTION Left 11/29/2014   Procedure: LEFT ANKLE MEDIAL MALLEOLUS OSTEOTOMY,AUTO GRAFT FROM CALCANEOUS;  OS TIBIA DENORO GRAFTING TALUS;  Surgeon: Toni Arthurs, MD;  Location: [facility];  Service: Orthopedics;  Laterality: Left;   POLYPECTOMY  12/25/2022   Procedure: POLYPECTOMY;  Surgeon: Dolores Frame, MD;  [facility]: [facility];  Service: Gastroenterology;;   TEE WITHOUT CARDIOVERSION  N/A 09/15/2019   Procedure: TRANSESOPHAGEAL ECHOCARDIOGRAM (TEE);  Surgeon: Alleen Borne, MD;  Location: [facility] OR;  Service: Open Heart Surgery;  Laterality: N/A;   TUBAL LIGATION       reports that she quit smoking about 3 years ago. Her smoking use included cigarettes. She has a 47.00 pack-year smoking history. She has been exposed to tobacco smoke. She has never used smokeless tobacco. She reports that she does not drink alcohol and does not use drugs.  Allergies  Allergen Reactions   Abilify [Aripiprazole]    Ciprofloxacin Nausea And Vomiting   Carbidopa-Levodopa Other (See Comments)    Developed tics while taking    Prednisone Other (See Comments)    Turns red all over    Venlafaxine Other (See Comments)    Developed tics while taking - reaction to Effexor     Family History  Problem Relation Age of Onset   Asthma Mother    COPD Mother    Diabetes Mother    Macular degeneration Mother    Congestive Heart Failure Father     Prior to Admission medications   Medication Sig Start Date End Date Taking? Authorizing Provider  acetaminophen (TYLENOL) 500 MG tablet Take 2 tablets by mouth 3 times daily as needed for pain 12/08/21  aspirin EC 81 MG tablet Take 1 tablet (81 mg total) by mouth daily. 09/05/19   Revankar, Aundra Dubin, MD  buPROPion (WELLBUTRIN XL) 300 MG 24 hr tablet Take 1 tablet (300 mg total) by mouth daily. 07/06/22     cholecalciferol 25 MCG (1000 UT) tablet Take 1 tablet (1,000 Units total) by mouth in the morning and at bedtime. 12/25/22   Johnson, Clanford L, MD  clopidogrel (PLAVIX) 75 MG tablet TAKE 1 TABLET BY MOUTH DAILY Patient taking differently: Take 75 mg by mouth daily. 03/30/22   Revankar, Aundra Dubin, MD  cyanocobalamin (VITAMIN B12) 1000 MCG tablet Take 1 tablet (1,000 mcg total) by mouth daily. 12/25/22   Johnson, Clanford L, MD  dapagliflozin propanediol (FARXIGA) 10 MG TABS tablet Take 1 tablet (10 mg total) by mouth daily before breakfast. 12/26/22    Laural Benes, Clanford L, MD  ferrous sulfate 325 (65 FE) MG tablet Take 1 tablet (325 mg total) by mouth 2 (two) times daily with a meal. 12/25/22   Johnson, Clanford L, MD  folic acid (FOLVITE) 1 MG tablet Take 1 tablet (1 mg total) by mouth daily. 12/26/22   Johnson, Clanford L, MD  furosemide (LASIX) 40 MG tablet Take 1.5 tablets (60 mg total) by mouth daily. 12/26/22   Johnson, Clanford L, MD  levothyroxine (SYNTHROID) 50 MCG tablet TAKE 1 TABLET BY MOUTH ONCE DAILY ON AN EMPTY STOMACH 30 MINUTES BEFORE BREAKFAST ON SAT AND SUNDAY Patient taking differently: Take 50 mcg by mouth See admin instructions. Take 1 tablet in the morning with breakfast on SATURDAY AND SUNDAY. 05/28/22     levothyroxine (SYNTHROID) 75 MCG tablet Take 1 tablet (75 mcg total) by mouth daily Monday thru Friday. 06/08/22     linaclotide (LINZESS) 145 MCG CAPS capsule Take 1 capsule (145 mcg total) by mouth daily before breakfast. 01/21/23   Aida Raider, NP  metoprolol succinate (TOPROL-XL) 25 MG 24 hr tablet Take 0.5 tablets (12.5 mg total) by mouth at bedtime. Take with or immediately following a meal. 12/25/22   Johnson, Clanford L, MD  nitroGLYCERIN (NITROSTAT) 0.4 MG SL tablet Place 1 tablet (0.4 mg total) under the tongue every 5 (five) minutes as needed for chest pain. 12/29/21   Revankar, Aundra Dubin, MD  ondansetron (ZOFRAN) 4 MG tablet take 1 tablet (4 mg) by mouth 2 times per day as needed for nausea Patient taking differently: Take 4 mg by mouth 2 (two) times daily as needed for nausea or vomiting. 05/12/22     pantoprazole (PROTONIX) 40 MG tablet Take 1 tablet (40 mg total) by mouth 2 (two) times daily. 12/25/22   Johnson, Clanford L, MD  promethazine (PHENERGAN) 25 MG tablet Take 1 tablet (25 mg total) by mouth every 6 (six) hours as needed for nausea or vomiting. 12/25/22   Standley Dakins L, MD  ranolazine (RANEXA) 500 MG 12 hr tablet Take 1 tablet by mouth 2 times daily 12/25/21   Revankar, Aundra Dubin, MD  rOPINIRole (REQUIP)  1 MG tablet Take 1 tablet (1 mg total) by mouth 3 (three) times daily. 11/12/22     rOPINIRole (REQUIP) 4 MG tablet Take 1 tablet (4 mg total) by mouth 1 to 3 hours before bedtime. 06/29/22     rosuvastatin (CRESTOR) 40 MG tablet Take 1 tablet (40 mg total) by mouth at bedtime. 12/25/22   Johnson, Clanford L, MD  topiramate (TOPAMAX) 100 MG tablet TAKE 1 TABLET BY MOUTH TWICE DAILY. 05/12/22     traMADol Janean Sark)  50 MG tablet Take 1 tablet (50 mg total) by mouth every 6 (six) hours. Patient taking differently: Take 50 mg by mouth daily as needed for moderate pain. 11/03/22     traZODone (DESYREL) 150 MG tablet Take 1 tablet (150 mg total) by mouth in the morning and at bedtime. 11/16/22     traZODone (DESYREL) 50 MG tablet Take 1 tablet (50 mg total) by mouth daily. 11/03/22       Physical Exam: Vitals:   01/25/23 1441 01/25/23 1442 01/25/23 1654  BP: (!) 89/79  (!) 119/97  Pulse: 92  84  Resp: 18  19  Temp: 98.2 F (36.8 C)    TempSrc: Oral    SpO2: 100%  100%  Weight:  66.2 kg   Height:  5\' 3"  (1.6 m)     Constitutional: NAD, calm, comfortable Vitals:   01/25/23 1441 01/25/23 1442 01/25/23 1654  BP: (!) 89/79  (!) 119/97  Pulse: 92  84  Resp: 18  19  Temp: 98.2 F (36.8 C)    TempSrc: Oral    SpO2: 100%  100%  Weight:  66.2 kg   Height:  5\' 3"  (1.6 m)    Eyes: PERRL, lids and conjunctivae normal ENMT: Mucous membranes are moist.   Neck: normal, supple, no masses, no thyromegaly Respiratory: Crackles right base, no increased work of breathing, no accessory muscle use. Cardiovascular: Regular rate and rhythm, no murmurs / rubs / gallops.  At least 2+ pitting bilateral lower extremity edema to knees.   warm Abdomen: no tenderness, no masses palpated. No hepatosplenomegaly. Bowel sounds positive.  Musculoskeletal: no clubbing / cyanosis. No joint deformity upper and lower extremities.  Skin: no rashes, lesions, ulcers. No induration Neurologic: No facial asymmetry, speech clear  fluent, 4/+5 strength in all extremities Psychiatric: Normal judgment and insight. Alert and oriented x 3. Normal mood.   Labs on Admission: I have personally reviewed following labs and imaging studies  CBC: Recent Labs  Lab 01/25/23 1522  WBC 6.6  NEUTROABS 4.5  HGB 8.6*  HCT 28.5*  MCV 90.5  PLT 293   Basic Metabolic Panel: Recent Labs  Lab 01/25/23 1522  NA 136  K 3.2*  CL 104  CO2 24  GLUCOSE 125*  BUN 18  CREATININE 1.97*  CALCIUM 8.7*  MG 2.3   GFR: Estimated Creatinine Clearance: 26.7 mL/min (A) (by C-G formula based on SCr of 1.97 mg/dL (H)). Liver Function Tests: Recent Labs  Lab 01/25/23 1522  AST 14*  ALT 11  ALKPHOS 64  BILITOT 0.4  PROT 6.6  ALBUMIN 3.3*   Urine analysis:    Component Value Date/Time   COLORURINE YELLOW 01/25/2023 1410   APPEARANCEUR CLEAR 01/25/2023 1410   LABSPEC 1.006 01/25/2023 1410   PHURINE 7.0 01/25/2023 1410   GLUCOSEU >=500 (A) 01/25/2023 1410   HGBUR NEGATIVE 01/25/2023 1410   BILIRUBINUR NEGATIVE 01/25/2023 1410   KETONESUR NEGATIVE 01/25/2023 1410   PROTEINUR NEGATIVE 01/25/2023 1410   NITRITE NEGATIVE 01/25/2023 1410   LEUKOCYTESUR NEGATIVE 01/25/2023 1410    Radiological Exams on Admission: DG Chest Port 1 View  Result Date: 01/25/2023 CLINICAL DATA:  Exertional dyspnea EXAM: PORTABLE CHEST 1 VIEW COMPARISON:  CXR 01/18/23 FINDINGS: No pleural effusion. No pneumothorax. No focal airspace opacity. Normal cardiac and mediastinal contours. Status post median sternotomy CABG. No radiographically apparent displaced rib fractures. Visualized upper abdomen is notable for surgical clips in the right upper quadrant. IMPRESSION: No focal airspace opacity. Electronically Signed  By: Lorenza Cambridge M.D.   On: 01/25/2023 15:30    EKG: Independently reviewed.  Sinus rhythm, rate 86, QTc 450.  No significant ST-T wave changes from prior.  Assessment/Plan Principal Problem:   Acute on chronic diastolic (congestive) heart  failure (HCC) Active Problems:   Hypokalemia   Diabetes mellitus due to underlying condition with unspecified complications (HCC)   S/P CABG x 3   History of depression   CKD (chronic kidney disease) stage 4, GFR 15-29 ml/min (HCC)   Anemia due to stage 4 chronic kidney disease (HCC)   Benign hypertension with CKD (chronic kidney disease) stage IV (HCC)   Assessment and Plan: * Acute on chronic diastolic (congestive) heart failure (HCC) At least 2+ pitting bilateral lower extremity edema.  Dyspnea on exertion.  Chest x-ray negative for acute abnormality.  BNP elevated at 243 above baseline.  Was previously on 60 mg daily of Lasix, this was increased to total of 80 mg daily without improvement in swelling.  Recent echo 11/2022 EF of 60 to 65%, G2DD. -Sent from cardiology outpatient clinic-Dr. Shirlean Jakeline recommends starting with IV Lasix 60 mg twice daily, continue [place], cardiology team to see in a.m. -Strict input output, daily weights, daily BMP  Hypokalemia Potassium 3 3.  Likely from Lasix.  Magnesium normal 2.3. - Replete K  Benign hypertension with CKD (chronic kidney disease) stage IV (HCC) Blood pressure soft- stable. -Monitor with diuresis -Resume metoprolol XL 12.5 mg daily  Anemia due to stage 4 chronic kidney disease (HCC) Hemoglobin stable at 8.6.  CKD (chronic kidney disease) stage 4, GFR 15-29 ml/min (HCC) Creatinine 1.97.  At baseline. - Monitor  S/P CABG x 3 Chronic unchanged intermittent chest pains, improved with as needed nitro.  On dual antiplatelet. -Resume ranolazine, as needed nitro -Resume aspirin, Plavix, metoprolol, Crestor  Diabetes mellitus due to underlying condition with unspecified complications (HCC) Controlled.  A1c less than 4.2. - On Farxiga -Monitor for hypoglycemia, with fasting CBGs   DVT prophylaxis: Lovenox Code Status: DNR, confirmed with both patient and daughter Asher Muir at bedside. Family Communication: Daughter Asher Muir is Product manager.    Disposition Plan: ~/>2 days Consults called: Cards Admission status:Inpt tele I certify that at the point of admission it is my clinical judgment that the patient will require inpatient hospital care spanning beyond 2 midnights from the point of admission due to high intensity of service, high risk for further deterioration and high frequency of surveillance required.   Author: Onnie Boer, MD 01/25/2023 7:52 PM  For on call review [email or web address].

## 2023-01-25 NOTE — Progress Notes (Addendum)
Cardiology Office Note  Date: 01/25/2023   ID: Meghan Terry, DOB 03/07/1961, MRN [id number]  PCP:  Benita Stabile, MD  Cardiologist:  Garwin Brothers, MD Electrophysiologist:  None   Reason for Office Visit: Follow-up of CAD   History of Present Illness: Meghan Terry is a 62 y.o. female known to have CAD s/p 3v CABG (LIMA to LAD, SVG to OM and SVG to PDA) with normal LVEF, HTN, DM 2, HLD, CKD stage IIIa is here for follow-up visit.  Patient underwent LHC in 2021 and 2022 that showed patent LIMA to LAD, patent graft to the PDA and patent graft to the OM.  Native multivessel CAD is unchanged compared to 2021 LHC.  She does have severe diffusely diseased LAD from proximal to mid after which competitive flow is noted in the mid LAD to LIMA graft insertion, CTO of LCx with very faint collaterals noted and heavy burden of stents in RCA from the mid vessel into the continuation beyond the origin of the PDA, the PDA is jailed no significant obstruction is noted in the RCA other than the PDA which was supplied by the graft.  Patient is here for visit, accompanied by daughter. Daughter is the power of attorney. Patient started to have DOE, orthopnea, PND, productive cough and bilateral leg swelling x 2 to 3 weeks prior to presentation.  She was originally on p.o. Lasix 60 mg once daily which her nephrologist increased to p.o. Lasix 40 mg twice daily did not have any increased urine output. She also had a rash on the right shin which was deemed to be from cellulitis and was treated for it. The rash resolved but the leg swelling did not improve.  I reviewed her labs, CMP which revealed serum creatinine 1.72 with GFR 33, normal serum albumin, decreased bicarbonate 17, elevated serum chloride 107.  Normal LFTs.  Ferritin is at the lower end of normal, 36.  Patient also reported having angina up to 3-4 times per week and sometimes 2 times the same day for which she takes SL NTG 0.4 mg as needed after which the  chest pain completely resolves.  Past Medical History:  Diagnosis Date   Acute diastolic CHF (congestive heart failure) (HCC) 06/08/2021   Acute pulmonary edema (HCC)    Anemia due to stage 4 chronic kidney disease (HCC) 07/20/2022   Angina pectoris (HCC) 11/17/2019   Anxiety    Atypical chest pain 12/30/2020   Benign hypertension with CKD (chronic kidney disease) stage IV (HCC) 07/20/2022   CHF (congestive heart failure) (HCC) 06/08/2021   CKD (chronic kidney disease) stage 3b, GFR 30-59 ml/min 12/03/2019   Coronary artery disease    Coronary artery disease involving native coronary artery of native heart with angina pectoris (HCC) 10/03/2019   Depression 04/25/2017   Diabetes mellitus due to underlying condition with unspecified complications (HCC) 09/05/2019   Diabetes mellitus with stage 4 chronic kidney disease GFR 15-29 (HCC) 07/20/2022   Essential hypertension 09/05/2019   Ex-smoker 09/05/2019   Family history of coronary artery disease 09/05/2019   History of depression 04/15/2020   Hx of diabetes mellitus 04/15/2020   Hyperlipidemia with target LDL less than 70 11/24/2019   Hyperphosphatemia 07/20/2022   Hypertension    Hypokalemia 04/30/2022   Hypothyroid    Migraine 04/25/2017   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   Syncope and collapse 04/29/2022   Thyroid disease    UTI (urinary tract infection) 04/30/2022    Past Surgical History:  Procedure Laterality Date   ABDOMINAL HYSTERECTOMY     APPENDECTOMY     BIOPSY  12/23/2022   Procedure: BIOPSY;  Surgeon: Corbin Ade, MD;  [facility]: [facility];  Service: Endoscopy;;   CARDIAC CATHETERIZATION   11/22/2019   CHOLECYSTECTOMY     COLONOSCOPY     COLONOSCOPY WITH PROPOFOL N/A 12/25/2022   Procedure: COLONOSCOPY WITH PROPOFOL;  Surgeon: Dolores Frame, MD;  Location: [facility];  Service: Gastroenterology;  Laterality: N/A;   CORONARY ARTERY BYPASS GRAFT N/A 09/15/2019   Procedure: CORONARY ARTERY BYPASS GRAFTING (CABG) times three on pump using left internal mammary artery and right and left greater saphenous veins harvested endoscopically;  Surgeon: Alleen Borne, MD;  Location: [facility];  Service: Open Heart Surgery;  Laterality: N/A;   CORONARY STENT INTERVENTION N/A 11/23/2019   Procedure: CORONARY STENT INTERVENTION;  Surgeon: Lennette Bihari, MD;  Location: [facility];  Service: Cardiovascular;  Laterality: N/A;   ESOPHAGEAL DILATION N/A 12/23/2022   Procedure: ESOPHAGEAL DILATION;  Surgeon: Corbin Ade, MD;  Location: [facility];  Service: Endoscopy;  Laterality: N/A;   ESOPHAGOGASTRODUODENOSCOPY (EGD) WITH PROPOFOL N/A 12/23/2022   Procedure: ESOPHAGOGASTRODUODENOSCOPY (EGD) WITH PROPOFOL;  Surgeon: Corbin Ade, MD;  [facility]: [facility];  Service: Endoscopy;  Laterality: N/A;   KNEE ARTHROSCOPY     LEFT HEART CATH AND CORONARY ANGIOGRAPHY N/A 09/14/2019   Procedure: LEFT HEART CATH AND CORONARY ANGIOGRAPHY;  Surgeon: Corky Crafts, MD;  [facility];  Service: Cardiovascular;  Laterality: N/A;   LEFT HEART CATH AND CORS/GRAFTS ANGIOGRAPHY N/A 11/22/2019   Procedure: LEFT HEART CATH AND CORS/GRAFTS ANGIOGRAPHY;  Surgeon: Lennette Bihari, MD;  Location: [facility];  Service: Cardiovascular;  Laterality: N/A;   LEFT HEART CATH AND CORS/GRAFTS ANGIOGRAPHY N/A 03/13/2020   Procedure: LEFT HEART CATH AND CORS/GRAFTS ANGIOGRAPHY;  Surgeon: Runell Gess, MD;  Location: [facility];  Service: Cardiovascular;  Laterality: N/A;   LEFT HEART CATH AND CORS/GRAFTS ANGIOGRAPHY N/A 12/31/2020   Procedure: LEFT HEART CATH AND  CORS/GRAFTS ANGIOGRAPHY;  Surgeon: Lyn Records, MD;  Location: [facility];  Service: Cardiovascular;  Laterality: N/A;   OSTEOCHONDRAL DEFECT REPAIR/RECONSTRUCTION Left 11/29/2014   Procedure: LEFT ANKLE MEDIAL MALLEOLUS OSTEOTOMY,AUTO GRAFT FROM CALCANEOUS;  OS TIBIA DENORO GRAFTING TALUS;  Surgeon: Toni Arthurs, MD;  Location: [facility];  Service: Orthopedics;  Laterality: Left;   POLYPECTOMY  12/25/2022   Procedure: POLYPECTOMY;  Surgeon: Dolores Frame, MD;  [facility];  Service: Gastroenterology;;   TEE WITHOUT CARDIOVERSION N/A 09/15/2019   Procedure: TRANSESOPHAGEAL ECHOCARDIOGRAM (TEE);  Surgeon: Alleen Borne, MD;  Location[facility];  Service: Open Heart Surgery;  Laterality: N/A;   TUBAL LIGATION      Current Outpatient Medications  Medication Sig Dispense Refill   acetaminophen (TYLENOL) 500 MG tablet Take 2 tablets by mouth 3 times daily as needed for pain 100 tablet 0   aspirin EC 81 MG tablet Take 1 tablet (81 mg total) by mouth daily. 90 tablet 3   buPROPion (WELLBUTRIN XL) 300  MG 24 hr tablet Take 1 tablet (300 mg total) by mouth daily. 90 tablet 2   cholecalciferol 25 MCG (1000 UT) tablet Take 1 tablet (1,000 Units total) by mouth in the morning and at bedtime.     clopidogrel (PLAVIX) 75 MG tablet TAKE 1 TABLET BY MOUTH DAILY (Patient taking differently: Take 75 mg by mouth daily.) 90 tablet 2   cyanocobalamin (VITAMIN B12) 1000 MCG tablet Take 1 tablet (1,000 mcg total) by mouth daily. 30 tablet 1   dapagliflozin propanediol (FARXIGA) 10 MG TABS tablet Take 1 tablet (10 mg total) by mouth daily before breakfast. 30 tablet 8   ferrous sulfate 325 (65 FE) MG tablet Take 1 tablet (325 mg total) by mouth 2 (two) times daily with a meal. 60 tablet 1   folic acid (FOLVITE) 1 MG tablet Take 1 tablet (1 mg total) by mouth daily. 30 tablet 1   furosemide (LASIX) 40 MG tablet Take 1.5 tablets (60 mg total) by mouth daily.     levothyroxine  (SYNTHROID) 50 MCG tablet TAKE 1 TABLET BY MOUTH ONCE DAILY ON AN EMPTY STOMACH 30 MINUTES BEFORE BREAKFAST ON SAT AND SUNDAY (Patient taking differently: Take 50 mcg by mouth See admin instructions. Take 1 tablet in the morning with breakfast on SATURDAY AND SUNDAY.) 24 tablet 3   levothyroxine (SYNTHROID) 75 MCG tablet Take 1 tablet (75 mcg total) by mouth daily Monday thru Friday. 60 tablet 3   linaclotide (LINZESS) 145 MCG CAPS capsule Take 1 capsule (145 mcg total) by mouth daily before breakfast. 30 capsule 3   metoprolol succinate (TOPROL-XL) 25 MG 24 hr tablet Take 0.5 tablets (12.5 mg total) by mouth at bedtime. Take with or immediately following a meal. 90 tablet 3   nitroGLYCERIN (NITROSTAT) 0.4 MG SL tablet Place 1 tablet (0.4 mg total) under the tongue every 5 (five) minutes as needed for chest pain. 25 tablet 7   ondansetron (ZOFRAN) 4 MG tablet take 1 tablet (4 mg) by mouth 2 times per day as needed for nausea (Patient taking differently: Take 4 mg by mouth 2 (two) times daily as needed for nausea or vomiting.) 180 tablet 1   pantoprazole (PROTONIX) 40 MG tablet Take 1 tablet (40 mg total) by mouth 2 (two) times daily. 60 tablet 1   promethazine (PHENERGAN) 25 MG tablet Take 1 tablet (25 mg total) by mouth every 6 (six) hours as needed for nausea or vomiting.     ranolazine (RANEXA) 500 MG 12 hr tablet Take 1 tablet by mouth 2 times daily 180 tablet 3   rOPINIRole (REQUIP) 1 MG tablet Take 1 tablet (1 mg total) by mouth 3 (three) times daily. 90 tablet 0   rOPINIRole (REQUIP) 4 MG tablet Take 1 tablet (4 mg total) by mouth 1 to 3 hours before bedtime. 90 tablet 0   rosuvastatin (CRESTOR) 40 MG tablet Take 1 tablet (40 mg total) by mouth at bedtime.     topiramate (TOPAMAX) 100 MG tablet TAKE 1 TABLET BY MOUTH TWICE DAILY. 180 tablet 3   traMADol (ULTRAM) 50 MG tablet Take 1 tablet (50 mg total) by mouth every 6 (six) hours. (Patient taking differently: Take 50 mg by mouth daily as needed  for moderate pain.) 30 tablet 2   traZODone (DESYREL) 150 MG tablet Take 1 tablet (150 mg total) by mouth in the morning and at bedtime. 30 tablet 0   traZODone (DESYREL) 50 MG tablet Take 1 tablet (50 mg total) by mouth daily.  30 tablet 0   No current facility-administered medications for this visit.   Allergies:  Abilify [aripiprazole], Ciprofloxacin, Carbidopa-levodopa, Prednisone, and Venlafaxine   Social History: The patient  reports that she quit smoking about 3 years ago. Her smoking use included cigarettes. She has a 47.00 pack-year smoking history. She has been exposed to tobacco smoke. She has never used smokeless tobacco. She reports that she does not drink alcohol and does not use drugs.   Family History: The patient's family history includes Asthma in her mother; COPD in her mother; Congestive Heart Failure in her father; Diabetes in her mother; Macular degeneration in her mother.   ROS:  Please see the history of present illness. Otherwise, complete review of systems is positive for none.  All other systems are reviewed and negative.   Physical Exam: VS:  BP 120/60   Pulse 86   Ht 5\' 3"  (1.6 m)   Wt 146 lb (66.2 kg)   SpO2 99%   BMI 25.86 kg/m , BMI Body mass index is 25.86 kg/m.  Wt Readings from Last 3 Encounters:  01/25/23 146 lb (66.2 kg)  01/19/23 142 lb 3.2 oz (64.5 kg)  01/18/23 141 lb (64 kg)    General: Patient appears comfortable at rest. HEENT: Conjunctiva and lids normal, oropharynx clear with moist mucosa. Neck: Supple, no elevated JVP or carotid bruits, no thyromegaly. Lungs: Clear to auscultation, nonlabored breathing at rest. Cardiac: Regular rate and rhythm, no S3 or significant systolic murmur, no pericardial rub. Abdomen: Soft, nontender, no hepatomegaly, bowel sounds present, no guarding or rebound. Extremities: 3) pitting edema, distal pulses 2+. Skin: Warm and dry. Musculoskeletal: No kyphosis. Neuropsychiatric: Alert and oriented x3, affect  grossly appropriate.  Recent Labwork: 04/30/2022: TSH 1.701 12/21/2022: ALT 14; AST 15; Magnesium 2.3 01/18/2023: B Natriuretic Peptide 178.0; BUN 15; Creatinine, Ser 1.70; Hemoglobin 8.3; Platelets 296; Potassium 3.3; Sodium 137     Component Value Date/Time   CHOL 147 03/28/2021 0831   TRIG 147 03/28/2021 0831   HDL 71 03/28/2021 0831   CHOLHDL 2.1 03/28/2021 0831   CHOLHDL 3.3 09/18/2019 0355   VLDL 24 09/18/2019 0355   LDLCALC 51 03/28/2021 0831    Other Studies Reviewed Today: Echocardiogram on 12/22/2022 LVEF 60 to 65% G2 DD RV systolic function is normal Normal PASP LA mildly dilated Mild MR No aortic valve lesions  Event monitor in 09/2022 Unremarkable  LHC in 12/2020 LIMA to LAD is patent. Bypass graft to circumflex is patent.  Distribution of native vessel after the insertion is very small.  The native vessel diameter is minuscule. Bypass graft to PDA is patent.  PDA is totally occluded at its ostium and is jailed by stent. Left main is widely patent. LAD is severely and diffusely diseased with 70 to 80% stenosis from proximal to mid.  Competitive flow is noted in the mid LAD due to LIMA graft insertion. Circumflex is totally occluded in the mid vessel.  Very faint collaterals are noted to small "threadlike vessels". Right coronary is widely patent and has a heavy burden of stents that extends from the mid vessel into the continuation beyond the origin of the PDA.  The PDA is jailed.  No significant obstruction is noted in the right coronary other than the PDA which is supplied by a graft. Hyperdynamic left ventricle.  EF greater than 70%.  LVEDP is normal.  Assessment and Plan: Patient is a 62 year old F known to have CAD s/p 3v CABG (LIMA to LAD, SVG to  OM and SVG to PDA) with normal LVEF, HTN, DM 2, HLD, CKD stage IIIa is here for follow-up visit.  # Acute on chronic diastolic heart failure exacerbation -Patient has symptoms of DOE, orthopnea, PND, productive cough  when she lays down and bilateral lower extremity pitting edema x 2 to 3 weeks. CMP from 01/21/2023 showed serum creatinine 1.72 with GFR 33, normal serum albumin, decreased bicarbonate 17, elevated serum chloride 107.  Additionally she was on p.o. Lasix 80 mg once daily which was increased to p.o. Lasix 40 mg twice daily by her nephrologist around 1 to 2 weeks ago with no relief in her symptoms. She will benefit from ER visit and hospital admission for IV diuresis. Start IV Lasix 60 mg twice daily and continue Farxiga 10 mg once daily. Spoke with the ER attending, Dr. Gloris Manchester and inpatient cardiology team is also aware of the patient who will see her tomorrow.  # CAD s/p 3v CABG (LIMA to LAD, SVG to OM and SVG to PDA) with normal LVEF, CCS II-III angina -Continue DAPT, aspirin 81 mg and Plavix 75 mg once daily -Continue rosuvastatin 40 mg nightly -Patient will need up titration of antianginal therapy due to frequent angina 4 times per week requiring sublingual nitroglycerin. Due to multiple lightheadedness episodes in the past, metoprolol dose was decreased from 25 mg to 12.5 mg once daily in 08/2022. She had unremarkable event monitor in 09/2022. Will probably increase ranolazine dose due to dizziness episodes.  # HLD: Continue rosuvastatin 40 mg nightly, goal LDL less than 55.   I have spent a total of 50 minutes with patient reviewing chart, EKGs, labs and examining patient as well as establishing an assessment and plan that was discussed with the patient.  > 50% of time was spent in direct patient care.    Medication Adjustments/Labs and Tests Ordered: Current medicines are reviewed at length with the patient today.  Concerns regarding medicines are outlined above.   Tests Ordered: No orders of the defined types were placed in this encounter.   Medication Changes: No orders of the defined types were placed in this encounter.   Disposition:  Follow up  1 month with APP and 3 months with  me  Signed, Ruby Dilone Verne Spurr, MD, 01/25/2023 1:36 PM    [facility] at [facility] [address] [place], [place] [address]

## 2023-01-25 NOTE — Assessment & Plan Note (Signed)
At least 2+ pitting bilateral lower extremity edema.  Dyspnea on exertion.  Chest x-ray negative for acute abnormality.  BNP elevated at 243 above baseline.  Was previously on 60 mg daily of Lasix, this was increased to total of 80 mg daily without improvement in swelling.  Recent echo 11/2022 EF of 60 to 65%, G2DD. -Sent from cardiology outpatient clinic-Dr. Shirlean Nazareth recommends starting with IV Lasix 60 mg twice daily, continue Farxiga, cardiology team to see in a.m. -Strict input output, daily weights, daily BMP

## 2023-01-25 NOTE — Assessment & Plan Note (Signed)
Potassium 3 3.  Likely from Lasix.  Magnesium normal 2.3. - Replete K

## 2023-01-25 NOTE — Assessment & Plan Note (Signed)
Blood pressure soft- stable. -Monitor with diuresis -Resume metoprolol XL 12.5 mg daily

## 2023-01-25 NOTE — ED Triage Notes (Signed)
Pt sent from heartcare for SOB/ CHF.  Pt with bilateral leg swelling.  Pt with weight gain, "less than 10lbs in a week"

## 2023-01-25 NOTE — Assessment & Plan Note (Signed)
Controlled.  A1c less than 4.2. - On Farxiga -Monitor for hypoglycemia, with fasting CBGs

## 2023-01-25 NOTE — Assessment & Plan Note (Signed)
Chronic unchanged intermittent chest pains, improved with as needed nitro.  On dual antiplatelet. -Resume ranolazine, as needed nitro -Resume aspirin, Plavix, metoprolol, Crestor

## 2023-01-26 DIAGNOSIS — Z951 Presence of aortocoronary bypass graft: Secondary | ICD-10-CM | POA: Diagnosis not present

## 2023-01-26 DIAGNOSIS — N1832 Chronic kidney disease, stage 3b: Secondary | ICD-10-CM | POA: Diagnosis not present

## 2023-01-26 DIAGNOSIS — I5033 Acute on chronic diastolic (congestive) heart failure: Secondary | ICD-10-CM | POA: Diagnosis not present

## 2023-01-26 DIAGNOSIS — I2510 Atherosclerotic heart disease of native coronary artery without angina pectoris: Secondary | ICD-10-CM | POA: Diagnosis not present

## 2023-01-26 DIAGNOSIS — I129 Hypertensive chronic kidney disease with stage 1 through stage 4 chronic kidney disease, or unspecified chronic kidney disease: Secondary | ICD-10-CM | POA: Diagnosis not present

## 2023-01-26 DIAGNOSIS — Z8659 Personal history of other mental and behavioral disorders: Secondary | ICD-10-CM

## 2023-01-26 DIAGNOSIS — N184 Chronic kidney disease, stage 4 (severe): Secondary | ICD-10-CM | POA: Diagnosis not present

## 2023-01-26 LAB — BASIC METABOLIC PANEL
Anion gap: 8 (ref 5–15)
BUN: 22 mg/dL (ref 8–23)
CO2: 24 mmol/L (ref 22–32)
Calcium: 8.5 mg/dL — ABNORMAL LOW (ref 8.9–10.3)
Chloride: 104 mmol/L (ref 98–111)
Creatinine, Ser: 2.11 mg/dL — ABNORMAL HIGH (ref 0.44–1.00)
GFR, Estimated: 26 mL/min — ABNORMAL LOW (ref >60)
Glucose, Bld: 99 mg/dL (ref 70–99)
Potassium: 3.7 mmol/L (ref 3.5–5.1)
Sodium: 136 mmol/L (ref 135–145)

## 2023-01-26 MED ORDER — BUPROPION HCL ER (XL) 300 MG PO TB24
300.0000 mg | ORAL_TABLET | Freq: Every day | ORAL | Status: DC
  Administered 2023-01-26 – 2023-01-27 (×2): 300 mg via ORAL
  Filled 2023-01-26 (×2): qty 1

## 2023-01-26 MED ORDER — ROPINIROLE HCL 1 MG PO TABS
1.0000 mg | ORAL_TABLET | Freq: Three times a day (TID) | ORAL | Status: DC | PRN
  Administered 2023-01-26: 1 mg via ORAL
  Filled 2023-01-26: qty 1

## 2023-01-26 MED ORDER — RANOLAZINE ER 500 MG PO TB12
1000.0000 mg | ORAL_TABLET | Freq: Two times a day (BID) | ORAL | Status: DC
  Administered 2023-01-26 – 2023-01-27 (×2): 1000 mg via ORAL
  Filled 2023-01-26 (×2): qty 2

## 2023-01-26 NOTE — Progress Notes (Signed)
Blood pressure is 96/50, heart rate 85. Patient does not appear to be in any distress at this time.  MD on call notified.

## 2023-01-26 NOTE — Progress Notes (Signed)
TED hose on per MD order

## 2023-01-26 NOTE — Consult Note (Addendum)
Cardiology Consultation   Patient ID: Meghan Terry MRN: [id number]; DOB: 1961/08/29  Admit date: 01/25/2023 Date of Consult: 01/26/2023  PCP:  Benita Stabile, MD   [facility] Providers Cardiologist:  Marjo Bicker, MD        Patient Profile:   Meghan Terry is a 62 y.o. female with a hx of CAD (s/p CABG in 2020 with LIMA-LAD, SVG-OM and SVG-PDA, prior subtotal occlusion of PDA which was felt to be due to spasm, DESx3 to RCA in 10/2019, cath in 12/2020 showing patent grafts and medical management recommended), HTN, HLD, Type II DM, Stage 4 CKD, anemia (EGD showing esophagitis and gastritis) and HFpEF who is being seen 01/26/2023 for the evaluation of CHF at the request of Dr. Wendall Stade.  History of Present Illness:   Meghan Terry presented to the office on 01/25/2023 and reported worsening lower extremity edema, dyspnea on exertion, orthopnea, PND and productive cough for the past 2 to 3 weeks. She was being followed by her Nephrologist and Lasix had been titrated from 60 mg daily to 40 mg twice daily with minimal improvement in her symptoms. Her weight was elevated to 146 lbs and she was sent to the ED for likely admission for IV diuresis. Was recommended to start IV Lasix 60 mg twice daily and continue Farxiga 10 mg daily. She had also reported episodes of frequent angina requiring the use of nitroglycerin, therefore was recommend to consider titration of antianginal therapy during her admission.  Initial labs showed WBC 6.6, Hgb 8.6, platelets 293, Na+ 136, K+ 3.2 and creatinine 1.97 (previously 2.1 - 2.90 in 11/2022). BNP 243. Negative for COVID and influenza.  CXR with no focal airspace opacity.  EKG showing normal sinus rhythm heart rate 88 with slight ST abnormality along the anterior leads.   She was started on IV Lasix 60 mg twice daily with a recorded net output of -1.6 L thus far. Weight listed as 136 lbs this AM.   In talking with the patient today, she reports having  worsening symptoms over the past few weeks as outlined above which persisted despite titration of Lasix. Reports a baseline weight of 139 lbs but says she had lost over 30 pounds since being on Trulicity since 08/2022. She uses a wedge pillow at home. Also reports having intermittent episodes of chest discomfort which have been occurring for several years and improve with the use of nitroglycerin. Previously on Ranexa 1000mg  BID and also Imdur in the past and tolerated well.    Past Medical History:  Diagnosis Date   Acute diastolic CHF (congestive heart failure) (HCC) 06/08/2021   Acute pulmonary edema (HCC)    Anemia due to stage 4 chronic kidney disease (HCC) 07/20/2022   Angina pectoris (HCC) 11/17/2019   Anxiety    Atypical chest pain 12/30/2020   Benign hypertension with CKD (chronic kidney disease) stage IV (HCC) 07/20/2022   CHF (congestive heart failure) (HCC) 06/08/2021   CKD (chronic kidney disease) stage 3b, GFR 30-59 ml/min 12/03/2019   Coronary artery disease    Coronary artery disease involving native coronary artery of native heart with angina pectoris (HCC) 10/03/2019   Depression 04/25/2017   Diabetes mellitus due to underlying condition with unspecified complications (HCC) 09/05/2019   Diabetes mellitus with stage 4 chronic kidney disease GFR 15-29 (HCC) 07/20/2022   Essential hypertension 09/05/2019   Ex-smoker 09/05/2019   Family history of coronary artery disease 09/05/2019   History of depression 04/15/2020  Hx of diabetes mellitus 04/15/2020   Hyperlipidemia with target LDL less than 70 11/24/2019   Hyperphosphatemia 07/20/2022   Hypertension    Hypokalemia 04/30/2022   Hypothyroid    Migraine 04/25/2017   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   Syncope and collapse 04/29/2022   Thyroid disease    UTI (urinary tract infection) 04/30/2022    Past Surgical History:  Procedure Laterality Date   ABDOMINAL HYSTERECTOMY     APPENDECTOMY     BIOPSY  12/23/2022   Procedure: BIOPSY;  Surgeon: Corbin Ade, MD;  [facility]: [facility];  Service: Endoscopy;;   CARDIAC CATHETERIZATION  11/22/2019   CHOLECYSTECTOMY     COLONOSCOPY     COLONOSCOPY WITH PROPOFOL N/A 12/25/2022   Procedure: COLONOSCOPY WITH PROPOFOL;  Surgeon: Dolores Frame, MD;  Location: [facility];  Service: Gastroenterology;  Laterality: N/A;   CORONARY ARTERY BYPASS GRAFT N/A 09/15/2019   Procedure: CORONARY ARTERY BYPASS GRAFTING (CABG) times three on pump using left internal mammary artery and right and left greater saphenous veins harvested endoscopically;  Surgeon: Alleen Borne, MD;  Location: [facility];  Service: Open Heart Surgery;  Laterality: N/A;   CORONARY STENT INTERVENTION N/A 11/23/2019   Procedure: CORONARY STENT INTERVENTION;  Surgeon: Lennette Bihari, MD;  Location: [facility];  Service: Cardiovascular;  Laterality: N/A;   ESOPHAGEAL DILATION N/A 12/23/2022   Procedure: ESOPHAGEAL DILATION;  Surgeon: Corbin Ade, MD;  [facility]: [facility];  Service: Endoscopy;  Laterality: N/A;   ESOPHAGOGASTRODUODENOSCOPY (EGD) WITH PROPOFOL N/A 12/23/2022   Procedure: ESOPHAGOGASTRODUODENOSCOPY (EGD) WITH PROPOFOL;  Surgeon: Corbin Ade, MD;  [facility]: [facility];  Service: Endoscopy;  Laterality: N/A;   KNEE ARTHROSCOPY     LEFT HEART CATH AND CORONARY ANGIOGRAPHY N/A 09/14/2019   Procedure: LEFT HEART CATH AND CORONARY ANGIOGRAPHY;  Surgeon: Corky Crafts, MD;  Location: [facility]AS[facility] CV LAB;  Service: Cardiovascular;  Laterality: N/A;   LEFT HEART CATH AND CORS/GRAFTS ANGIOGRAPHY N/A 11/22/2019   Procedure: LEFT HEART CATH AND  CORS/GRAFTS ANGIOGRAPHY;  Surgeon: Lennette Bihari, MD;  Location: [facility];  Service: Cardiovascular;  Laterality: N/A;   LEFT HEART CATH AND CORS/GRAFTS ANGIOGRAPHY N/A 03/13/2020   Procedure: LEFT HEART CATH AND CORS/GRAFTS ANGIOGRAPHY;  Surgeon: Runell Gess, MD;  Location: [facility];  Service: Cardiovascular;  Laterality: N/A;   LEFT HEART CATH AND CORS/GRAFTS ANGIOGRAPHY N/A 12/31/2020   Procedure: LEFT HEART CATH AND CORS/GRAFTS ANGIOGRAPHY;  Surgeon: Lyn Records, MD;  Location: [facility];  Service: Cardiovascular;  Laterality: N/A;   OSTEOCHONDRAL DEFECT REPAIR/RECONSTRUCTION Left 11/29/2014   Procedure: LEFT ANKLE MEDIAL MALLEOLUS OSTEOTOMY,AUTO GRAFT FROM CALCANEOUS;  OS TIBIA DENORO GRAFTING TALUS;  Surgeon: Toni Arthurs, MD;  Location: [facility];  Service: Orthopedics;  Laterality: Left;   POLYPECTOMY  12/25/2022   Procedure: POLYPECTOMY;  Surgeon: Dolores Frame, MD;  [facility];  Service: Gastroenterology;;   TEE WITHOUT CARDIOVERSION N/A 09/15/2019   Procedure: TRANSESOPHAGEAL ECHOCARDIOGRAM (TEE);  Surgeon: Alleen Borne, MD;  Location: [facility] OR;  Service: Open Heart Surgery;  Laterality:  N/A;   TUBAL LIGATION       Home Medications:  Prior to Admission medications   Medication Sig Start Date End Date Taking? Authorizing Provider  acetaminophen (TYLENOL) 500 MG tablet Take 2 tablets by mouth 3 times daily as needed for pain 12/08/21  Yes   aspirin EC 81 MG tablet Take 1 tablet (81 mg total) by mouth daily. 09/05/19  Yes Revankar, Aundra Dubin, MD  buPROPion (WELLBUTRIN XL) 300 MG 24 hr tablet Take 1 tablet (300 mg total) by mouth daily. 07/06/22  Yes   cholecalciferol 25 MCG (1000 UT) tablet Take 1 tablet (1,000 Units total) by mouth in the morning and at bedtime. 12/25/22  Yes Johnson, Clanford L, MD  clopidogrel (PLAVIX) 75 MG tablet TAKE 1 TABLET BY MOUTH DAILY Patient taking differently: Take 75 mg by mouth daily.  03/30/22  Yes Revankar, Aundra Dubin, MD  cyanocobalamin (VITAMIN B12) 1000 MCG tablet Take 1 tablet (1,000 mcg total) by mouth daily. 12/25/22  Yes Johnson, Clanford L, MD  dapagliflozin propanediol (FARXIGA) 10 MG TABS tablet Take 1 tablet (10 mg total) by mouth daily before breakfast. 12/26/22  Yes Johnson, Clanford L, MD  ferrous sulfate 325 (65 FE) MG tablet Take 1 tablet (325 mg total) by mouth 2 (two) times daily with a meal. 12/25/22  Yes Johnson, Clanford L, MD  folic acid (FOLVITE) 1 MG tablet Take 1 tablet (1 mg total) by mouth daily. 12/26/22  Yes Johnson, Clanford L, MD  furosemide (LASIX) 40 MG tablet Take 1.5 tablets (60 mg total) by mouth daily. 12/26/22  Yes Johnson, Clanford L, MD  levothyroxine (SYNTHROID) 50 MCG tablet TAKE 1 TABLET BY MOUTH ONCE DAILY ON AN EMPTY STOMACH 30 MINUTES BEFORE BREAKFAST ON SAT AND SUNDAY Patient taking differently: Take 50 mcg by mouth See admin instructions. Take 1 tablet in the morning with breakfast on SATURDAY AND SUNDAY. 05/28/22  Yes   levothyroxine (SYNTHROID) 75 MCG tablet Take 1 tablet (75 mcg total) by mouth daily Monday thru Friday. 06/08/22  Yes   linaclotide (LINZESS) 145 MCG CAPS capsule Take 1 capsule (145 mcg total) by mouth daily before breakfast. 01/21/23  Yes Mahon, Frederik Schmidt, NP  metoprolol succinate (TOPROL-XL) 25 MG 24 hr tablet Take 0.5 tablets (12.5 mg total) by mouth at bedtime. Take with or immediately following a meal. 12/25/22  Yes Johnson, Clanford L, MD  nitroGLYCERIN (NITROSTAT) 0.4 MG SL tablet Place 1 tablet (0.4 mg total) under the tongue every 5 (five) minutes as needed for chest pain. 12/29/21  Yes Revankar, Aundra Dubin, MD  ondansetron (ZOFRAN) 4 MG tablet take 1 tablet (4 mg) by mouth 2 times per day as needed for nausea Patient taking differently: Take 4 mg by mouth 2 (two) times daily as needed for nausea or vomiting. 05/12/22  Yes   pantoprazole (PROTONIX) 40 MG tablet Take 1 tablet (40 mg total) by mouth 2 (two) times daily. 12/25/22   Yes Johnson, Clanford L, MD  promethazine (PHENERGAN) 25 MG tablet Take 1 tablet (25 mg total) by mouth every 6 (six) hours as needed for nausea or vomiting. 12/25/22  Yes Johnson, Clanford L, MD  ranolazine (RANEXA) 500 MG 12 hr tablet Take 1 tablet by mouth 2 times daily 12/25/21  Yes Revankar, Aundra Dubin, MD  rOPINIRole (REQUIP) 1 MG tablet Take 1 tablet (1 mg total) by mouth 3 (three) times daily. 11/12/22  Yes   rOPINIRole (REQUIP) 4 MG tablet Take 1 tablet (4 mg total) by mouth 1  to 3 hours before bedtime. 06/29/22  Yes   rosuvastatin (CRESTOR) 40 MG tablet Take 1 tablet (40 mg total) by mouth at bedtime. 12/25/22  Yes Johnson, Clanford L, MD  topiramate (TOPAMAX) 100 MG tablet TAKE 1 TABLET BY MOUTH TWICE DAILY. 05/12/22  Yes   traMADol (ULTRAM) 50 MG tablet Take 1 tablet (50 mg total) by mouth every 6 (six) hours. Patient taking differently: Take 50 mg by mouth daily as needed for moderate pain. 11/03/22  Yes   traZODone (DESYREL) 150 MG tablet Take 1 tablet (150 mg total) by mouth in the morning and at bedtime. 11/16/22  Yes   traZODone (DESYREL) 50 MG tablet Take 1 tablet (50 mg total) by mouth daily. 11/03/22  Yes     Inpatient Medications: Scheduled Meds:  aspirin EC  81 mg Oral Daily   dapagliflozin propanediol  10 mg Oral Daily   enoxaparin (LOVENOX) injection  30 mg Subcutaneous Q24H   furosemide  60 mg Intravenous Q12H   levothyroxine  50 mcg Oral Daily   metoprolol succinate  12.5 mg Oral QHS   pantoprazole  40 mg Oral BID   ranolazine  500 mg Oral BID   rosuvastatin  40 mg Oral QHS   Continuous Infusions:  PRN Meds: acetaminophen **OR** acetaminophen, nitroGLYCERIN, ondansetron **OR** ondansetron (ZOFRAN) IV, polyethylene glycol, traMADol, traZODone  Allergies:    Allergies  Allergen Reactions   Abilify [Aripiprazole]    Ciprofloxacin Nausea And Vomiting   Carbidopa-Levodopa Other (See Comments)    Developed tics while taking    Prednisone Other (See Comments)    Turns red  all over    Venlafaxine Other (See Comments)    Developed tics while taking - reaction to Effexor     Social History:   Social History   Socioeconomic History   Marital status: Married    Spouse name: Meghan Terry   Number of children: 2   Years of education: Not on file   Highest education level: Associate degree: occupational, Scientist, product/process development, or vocational program  Occupational History   Occupation: disabilty    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  Tobacco Use   Smoking status: Former    Packs/day: 1.00    Years: 47.00    Additional pack years: 0.00    Total pack years: 47.00    Types: Cigarettes    Quit date: 09/15/2019    Years since quitting: 3.3    Passive exposure: Past   Smokeless tobacco: Never  Vaping Use   Vaping Use: Every day   Start date: 09/15/2019   Substances: Nicotine  Substance and Sexual Activity   Alcohol use: No   Drug use: No   Sexual activity: Not on file  Other Topics Concern   Not on file  Social History Narrative   Not on file   Social Determinants of Health   Financial Resource Strain: High Risk (06/19/2021)   Overall Financial Resource Strain (CARDIA)    Difficulty of Paying Living Expenses: Very hard  Food Insecurity: No Food Insecurity (01/25/2023)   Hunger Vital Sign    Worried About Running Out of Food in the Last Year: Never true    Ran Out of Food in the Last Year: Never true  Transportation Needs: No Transportation Needs (01/25/2023)   PRAPARE - Administrator, Civil Service (Medical): No    Lack of Transportation (Non-Medical): No  Physical Activity: Not on file  Stress: Not on file  Social Connections: Not on file  Intimate Partner Violence: Not At Risk (01/25/2023)   Humiliation, Afraid, Rape, and Kick questionnaire    Fear of Current or Ex-Partner: No    Emotionally Abused: No    Physically Abused: No    Sexually Abused: No    Family History:    Family History  Problem Relation Age of Onset   Asthma Mother     COPD Mother    Diabetes Mother    Macular degeneration Mother    Congestive Heart Failure Father      ROS:  Please see the history of present illness.   All other ROS reviewed and negative.     Physical Exam/Data:   Vitals:   01/25/23 2355 01/26/23 0254 01/26/23 0337 01/26/23 0500  BP: (!) 96/52 (!) 95/46 (!) 96/50   Pulse: 89 85    Resp: 18 18    Temp: 98.6 F (37 C) 98.6 F (37 C)    TempSrc:      SpO2: 100% 100%    Weight:    62.1 kg  Height:        Intake/Output Summary (Last 24 hours) at 01/26/2023 0801 Last data filed at 01/26/2023 0545 Gross per 24 hour  Intake 600 ml  Output 2200 ml  Net -1600 ml      01/26/2023    5:00 AM 01/25/2023    2:42 PM 01/25/2023   12:56 PM  Last 3 Weights  Weight (lbs) 136 lb 14.5 oz 146 lb 146 lb  Weight (kg) 62.1 kg 66.225 kg 66.225 kg     Body mass index is 24.25 kg/m.  General: Pleasant female appearing in no acute distress.  HEENT: normal Neck: JVD at 9-10 cm.  Vascular: No carotid bruits; Distal pulses 2+ bilaterally Cardiac:  normal S1, S2; RRR; no murmur Lungs: No wheezing, rales or rhonchi.  Abd: soft, nontender, no hepatomegaly  Ext: 2+ pitting edema bilaterally Musculoskeletal:  No deformities, BUE and BLE strength normal and equal Skin: warm and dry  Neuro:  CNs 2-12 intact, no focal abnormalities noted Psych:  Normal affect   EKG:  The EKG was personally reviewed and demonstrates: Normal sinus rhythm heart rate 88 with slight ST abnormality along the anterior leads.   Telemetry:  Telemetry was personally reviewed and demonstrates: NSR, HR in 80's to 90's.   Relevant CV Studies:  LHC: 12/2020 Overall, no significant change when compared to angiography performed in June 2021. LIMA to LAD is patent. Bypass graft to circumflex is patent.  Distribution of native vessel after the insertion is very small.  The native vessel diameter is minuscule. Bypass graft to PDA is patent.  PDA is totally occluded at its  ostium and is jailed by stent. Left main is widely patent. LAD is severely and diffusely diseased with 70 to 80% stenosis from proximal to mid.  Competitive flow is noted in the mid LAD due to LIMA graft insertion. Circumflex is totally occluded in the mid vessel.  Very faint collaterals are noted to small "threadlike vessels". Right coronary is widely patent and has a heavy burden of stents that extends from the mid vessel into the continuation beyond the origin of the PDA.  The PDA is jailed.  No significant obstruction is noted in the right coronary other than the PDA which is supplied by a graft. Hyperdynamic left ventricle.  EF greater than 70%.  LVEDP is normal.   Recommendations:   Risk factor modification. Uptitrate anti-ischemic therapy as needed to control angina. Anatomy is stable  over the past 10 months.  Echocardiogram: 11/2022 IMPRESSIONS     1. Left ventricular ejection fraction, by estimation, is 60 to 65%. The  left ventricle has normal function. The left ventricle has no regional  wall motion abnormalities. Left ventricular diastolic parameters are  consistent with Grade II diastolic  dysfunction (pseudonormalization).   2. Right ventricular systolic function is normal. The right ventricular  size is normal. There is normal pulmonary artery systolic pressure. The  estimated right ventricular systolic pressure is 19.0 mmHg.   3. Left atrial size was mildly dilated.   4. The mitral valve is grossly normal. Mild mitral valve regurgitation.   5. The aortic valve is tricuspid. There is mild calcification of the  aortic valve. Aortic valve regurgitation is not visualized. Aortic valve  sclerosis is present, with no evidence of aortic valve stenosis.   6. The inferior vena cava is normal in size with greater than 50%  respiratory variability, suggesting right atrial pressure of 3 mmHg.   Comparison(s): Prior images reviewed side by side. LVEF remains normal  range at  60-65%. RV contraction is normal.   Laboratory Data:  High Sensitivity Troponin:   Recent Labs  Lab 01/18/23 1451 01/18/23 1622  TROPONINIHS 4 4     Chemistry Recent Labs  Lab 01/25/23 1522 01/26/23 0359  NA 136 136  K 3.2* 3.7  CL 104 104  CO2 24 24  GLUCOSE 125* 99  BUN 18 22  CREATININE 1.97* 2.11*  CALCIUM 8.7* 8.5*  MG 2.3  --   GFRNONAA 28* 26*  ANIONGAP 8 8    Recent Labs  Lab 01/25/23 1522  PROT 6.6  ALBUMIN 3.3*  AST 14*  ALT 11  ALKPHOS 64  BILITOT 0.4   Lipids No results for input(s): "CHOL", "TRIG", "HDL", "LABVLDL", "LDLCALC", "CHOLHDL" in the last 168 hours.  Hematology Recent Labs  Lab 01/25/23 1522  WBC 6.6  RBC 3.15*  HGB 8.6*  HCT 28.5*  MCV 90.5  MCH 27.3  MCHC 30.2  RDW Not Measured  PLT 293   Thyroid No results for input(s): "TSH", "FREET4" in the last 168 hours.  BNP Recent Labs  Lab 01/25/23 1522  BNP 243.0*    DDimer No results for input(s): "DDIMER" in the last 168 hours.   Radiology/Studies:  DG Chest Port 1 View  Result Date: 01/25/2023 CLINICAL DATA:  Exertional dyspnea EXAM: PORTABLE CHEST 1 VIEW COMPARISON:  CXR 01/18/23 FINDINGS: No pleural effusion. No pneumothorax. No focal airspace opacity. Normal cardiac and mediastinal contours. Status post median sternotomy CABG. No radiographically apparent displaced rib fractures. Visualized upper abdomen is notable for surgical clips in the right upper quadrant. IMPRESSION: No focal airspace opacity. Electronically Signed   By: Lorenza Cambridge M.D.   On: 01/25/2023 15:30     Assessment and Plan:   1. Acute HFpEF - Directly admitted from clinic with an acute CHF exacerbation and BNP elevated to 243. She has been started on IV Lasix 60 mg twice daily and is overall responding well thus far with a recorded net output of -1.6L but weight listed as having declined by 10 pounds. She reports a dry weight of 139 lbs but still appears volume overloaded by examination today. Will need  to establish new dry weight for her as she previously lost weight while on Trulicity. - Continue with IV Lasix 60 mg twice daily for now. Repeat BMET in AS as creatinine has trended up slightly from 1.97 to 2.11 but overall  below her prior baseline.  Continue Farxiga 10 mg daily as well.  2. CAD - She is s/p CABG in 2020 with LIMA-LAD, SVG-OM and SVG-PDA, prior subtotal occlusion of PDA which was felt to be due to spasm, DESx3 to RCA in 10/2019 and cath in 12/2020 showed patent grafts and medical management recommended. - She does report having episodes of angina as described above and this has occurred since prior CABG. She remains on ASA 81 mg daily, Toprol-XL 12.5 mg daily, Ranexa 500 mg twice daily and Crestor 40 mg daily. Will plan to titrate Ranexa to 1000 mg twice daily as she reports being on this in the past and tolerated well.  She was also on Imdur in the past and tolerated well but would hold off on this for now given her variable BP.  3. HTN - Her BP has been variable with SBP in the 80's at times but BP now up to 120/58 on most recent check. She remains on Toprol-XL 12.5 mg daily.  4. HLD - LDL was at 85 in 12/2021. Will recheck with AM labs. Remains on Crestor 40mg  daily.   5. Stage 4 CKD - Followed by Dr. Wolfgang Phoenix as an outpatient. Creatinine was variable from 2.1 - 2.9 last month. At 1.97 on admission and trending up to 2.11 today. Continue to follow with diuresis.   6. Anemia - EGD last month showing esophagitis and gastritis with Hgb at 5.1 that admission. Stable at 8.6 on 01/25/2023.   For questions or updates, please contact [facility] Please consult [email or web address] for contact info under    Signed, Ellsworth Lennox, PA-C  01/26/2023 8:01 AM  Attending note  Patient seen and discussed with PA Iran Ouch, I agree with her documentation. 62 yo female history of CAD with prior CABG LIMA to LAD, SVG to OM and SVG to PDA , HTN, DM2, HDL, CKD IIIA presented from  cardiology clinic with symptoms of DOE, orthopnea, PND, LE edema. SHe reports compliance with home diuretic.    K 3.2 Cr 1.97 BNP 243 WBC 6.6 Hgb 8.6 Plt 293 Mg 2.3 CXR no acute process EKG SR, nonspecific ST/T changes 11/2022 echo: LVE 60-65%, no WMAs, grade II dd, normal RV,    1.Acute on chronic HFpEF - 11/2022 echo: LVE 60-65%, no WMAs, grade II dd, normal RV,  - she is on IV lasix 60mg  bid, negative 1.6 L thus far. Slight uptrend in Cr but within prior range. Continue IV diuretics - already on farxiga at home,continue.   2. CAD - prior CABG as reported above - no acute issues  3. CKD IIIB/IV - follow Cr with diuresis.    Dina Rich MD

## 2023-01-26 NOTE — Progress Notes (Signed)
   01/26/23 0800  ReDS Vest / Clip  Station Marker B  Ruler Value 36  ReDS Value Range < 36  ReDS Actual Value 28

## 2023-01-26 NOTE — Assessment & Plan Note (Signed)
-  Stable mood on exam -Continue the use of Wellbutrin.

## 2023-01-26 NOTE — Progress Notes (Signed)
Progress Note   Patient: Meghan Terry:[id number] DOB: [birth year] DOA: 01/25/2023     1 DOS: the patient was seen and examined on 01/26/2023   Brief hospital admission course: As per H&P written by Dr. Mariea Clonts on 01/25/2023 Meghan Terry is a 62 y.o. female with medical history significant for diastolic CHF, CKD stage IV, hypertension, CABG, diabetes mellitus, hypertension and depression. She was referred to the ED from her cardiologist office for admission with the complaints of bilateral lower extremity swelling the past 2 to 3 weeks.  Her dose of Lasix was increased to 80 mg daily which she took for 5 days without significant improvement in swelling.  She reports difficulty breathing with exertion only..  About 10 pound weight gain in the past week.  She has chronic productive cough-at this time is not significantly changed. She has chronic intermittent chest pain that is unchanged from baseline for which she takes as needed nitro that helps.  Last chest pain was earlier today, lasted about couple of minutes, she did not need to take nitro, lower midsternal, per patient-difficult to describe character of pain.   ED Course: Stable vitals.  BNP 243 increased from baseline.  Potassium 3.2. Chest Xray- Negative for acute abnormality.   Assessment and Plan: * Acute on chronic diastolic (congestive) heart failure (HCC) -Continue to follow daily weights, low-sodium diet and strict intake and output -Continue Reds clip measurements assessments -Following cardiology recommendations continue IV Lasix for another 24 hours (60 mg twice a day) -Patient will require new dry weight establishment. -Close monitoring to renal function and electrolytes while diuresing -Continue the use of [place].   -Good urine output has been reported; still with signs of fluid overload on exam.  Hypokalemia -Magnesium within normal limits -Potassium has been repleted and currently stable -Continue to follow electrolytes  trend and further replete as needed -Continue telemetry monitoring.  Benign hypertension with CKD (chronic kidney disease) stage IV (HCC) -Soft but stable -Patient denies dizziness or lightheadedness -Continue to follow vital signs especially around IV diuresis  Anemia due to stage 4 chronic kidney disease (HCC) -Anemia of chronic disease -No overt bleeding appreciated -Current hemoglobin level stable -Continue to follow trend.  CKD (chronic kidney disease) stage 4, GFR 15-29 ml/min (HCC) -Stable and at baseline -Continue close monitoring while providing IV diuresis -Closely follow patient's electrolytes. -Maintain adequate oral hydration -Minimize/avoid unnecessary use of nephrotoxic agents and the use of contrast  History of depression -Stable mood on exam -Continue the use of Wellbutrin.  S/P CABG x 3 -No chest pain at time of examination -Continue dual antiplatelet therapy -Continue the use of Ranexa, aspirin, Plavix, metoprolol and Crestor. -Heart healthy/low-sodium diet discussed with patient.  Diabetes mellitus due to underlying condition with unspecified complications (HCC) -Last A1c 4.2 -Continue to follow CBGs fluctuation -Continue the use of Farxiga. -Patient advised not to skip meals.   Subjective:  Afebrile, no chest pain, no nausea or vomiting.  Reports feeling better and breathing easier.  Still with signs of fluid overload on examination.  Good urine output reported.  Physical Exam: Vitals:   01/26/23 0337 01/26/23 0500 01/26/23 0800 01/26/23 1422  BP: (!) 96/50  (!) 120/58 121/60  Pulse:   91 94  Resp:   17 16  Temp:   98.2 F (36.8 C) 98.4 F (36.9 C)  TempSrc:   Oral Oral  SpO2:    100%  Weight:  62.1 kg    Height:  General exam: Alert, awake, oriented x 3; no chest pain, no requiring oxygen supplementation and expressing feeling better. Respiratory system: Decreased breath sounds at the bases; no wheezing, no using accessory  muscles. Cardiovascular system: Rate controlled, no rubs, no gallops, no JVD on exam. Gastrointestinal system: Abdomen is nondistended, soft and nontender. No organomegaly or masses felt. Normal bowel sounds heard. Central nervous system: Alert and oriented. No focal neurological deficits. Extremities: No cyanosis or clubbing; 1-2+ edema appreciated bilaterally. Skin: No petechiae. Psychiatry: Judgement and insight appear normal. Mood & affect appropriate.   Data Reviewed: Basic metabolic panel: Sodium 136, potassium 3.7, chloride 104, bicarb 24, BUN 22, creatinine 2.11 (baseline 1.9-2.4); GFR 26.  Family Communication: No family at bedside.  Disposition: Status is: Inpatient Remains inpatient appropriate because: Continue IV diuresis.   Planned Discharge Destination: Home  Time spent: 35 minutes  Author: Vassie Loll, MD 01/26/2023 5:37 PM  For on call review [email or web address].

## 2023-01-26 NOTE — TOC Initial Note (Signed)
Transition of Care [facility]) - Initial/Assessment Note    Patient Details  Name: Meghan Terry MRN: [id number] Date of Birth: July 13, 1961  Transition of Care [facility]) CM/SW Contact:    Annice Needy, LCSW Phone Number: 01/26/2023, 3:08 PM  Clinical Narrative:                 Patient from home with daughter. She has been staying with daughter since she was released from the hospital. At baseline, she lives with her husband. Patient is independent at baseline. Has not been driving due to previously passing out for unknown reason. Follows HH diet, takes daily weights. Daughter takes to appointments.   Expected Discharge Plan: Home/Self Care Barriers to Discharge: Continued Medical Work up   Patient Goals and CMS Choice Patient states their goals for this hospitalization and ongoing recovery are:: return home          Expected Discharge Plan and Services       Living arrangements for the past 2 months: Apartment                                      Prior Living Arrangements/Services Living arrangements for the past 2 months: Apartment Lives with:: Adult Children, Spouse Patient language and need for interpreter reviewed:: Yes        Need for Family Participation in Patient Care: Yes (Comment) Care giver support system in place?: Yes (comment)   Criminal Activity/Legal Involvement Pertinent to Current Situation/Hospitalization: No - Comment as needed  Activities of Daily Living Home Assistive Devices/Equipment: Eyeglasses, Dentures (specify type) ADL Screening (condition at time of admission) Patient's cognitive ability adequate to safely complete daily activities?: Yes Is the patient deaf or have difficulty hearing?: No Does the patient have difficulty seeing, even when wearing glasses/contacts?: No Does the patient have difficulty concentrating, remembering, or making decisions?: No Patient able to express need for assistance with ADLs?: Yes Does the patient have  difficulty dressing or bathing?: No Independently performs ADLs?: Yes (appropriate for developmental age) Does the patient have difficulty walking or climbing stairs?: Yes Weakness of Legs: Both Weakness of Arms/Hands: None  Permission Sought/Granted                  Emotional Assessment     Affect (typically observed): Appropriate Orientation: : Oriented to Place, Oriented to  Time, Oriented to Situation, Oriented to Self Alcohol / Substance Use: Not Applicable Psych Involvement: No (comment)  Admission diagnosis:  Acute on chronic diastolic (congestive) heart failure (HCC) [I50.33] Acute on chronic congestive heart failure, unspecified heart failure type (HCC) [I50.9] Patient Active Problem List   Diagnosis Date Noted   Acute on chronic diastolic (congestive) heart failure (HCC) 01/25/2023   IDA (iron deficiency anemia) 12/23/2022   Symptomatic anemia 12/21/2022   Diabetic nephropathy associated with type 2 diabetes mellitus (HCC) 08/10/2022   Anemia due to stage 4 chronic kidney disease (HCC) 07/20/2022   Hyperphosphatemia 07/20/2022   Prerenal azotemia 07/20/2022   Benign hypertension with CKD (chronic kidney disease) stage IV (HCC) 07/20/2022   Diabetes mellitus with stage 4 chronic kidney disease GFR 15-29 (HCC) 07/20/2022   CKD (chronic kidney disease) stage 4, GFR 15-29 ml/min (HCC) 05/01/2022   UTI (urinary tract infection) 04/30/2022   Hypokalemia 04/30/2022   AKI on CKD IV 04/30/2022   Syncope and collapse 04/29/2022   Acute diastolic CHF (congestive heart failure) (HCC) 06/08/2021  HFpEF 06/08/2021   Acute pulmonary edema (HCC)    RLS (restless legs syndrome)    Anxiety    Coronary artery disease    Hypertension    Thyroid disease    Atypical chest pain 12/30/2020   Restless legs syndrome 04/15/2020   History of depression 04/15/2020   Hx of diabetes mellitus 04/15/2020   CKD (chronic kidney disease) stage 3b, GFR 30-59 ml/min 12/03/2019    Hyperlipidemia with target LDL less than 70 11/24/2019   Presence of drug coated stent in right coronary artery 11/24/2019   Angina pectoris (HCC) 11/17/2019   Coronary artery disease involving native coronary artery of native heart with angina pectoris (HCC) 10/03/2019   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   Essential hypertension 09/05/2019   Diabetes mellitus due to underlying condition with unspecified complications (HCC) 09/05/2019   Ex-smoker 09/05/2019   Family history of coronary artery disease 09/05/2019   Migraine 04/25/2017   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   Hypothyroid 04/25/2017   Depression 04/25/2017   Pyelonephritis 05/20/2016   PCP:  Benita Stabile, MD Pharmacy:   [facility] - [place], [place] - [address] [place] [address] Phone: [phone] Fax: [phone]  [facility] - [place], [place] - [address] [place] [address] Phone: [phone] Fax: [phone]     Social Determinants of Health (SDOH) Social History: SDOH Screenings   Food Insecurity: No Food Insecurity (01/25/2023)  Housing: Low Risk  (01/25/2023)  Transportation Needs: No Transportation Needs (01/25/2023)  Utilities: Not At Risk (01/25/2023)  Alcohol Screen: Low Risk  (06/10/2021)  Financial Resource Strain: High Risk (06/19/2021)  Tobacco Use: Medium Risk (01/25/2023)   SDOH Interventions:     Readmission Risk Interventions     No data to display

## 2023-01-27 ENCOUNTER — Encounter (HOSPITAL_COMMUNITY): Admission: RE | Payer: Self-pay | Source: Home / Self Care

## 2023-01-27 ENCOUNTER — Ambulatory Visit (HOSPITAL_COMMUNITY): Admission: RE | Admit: 2023-01-27 | Payer: Medicare Other | Source: Home / Self Care | Admitting: Gastroenterology

## 2023-01-27 DIAGNOSIS — I5033 Acute on chronic diastolic (congestive) heart failure: Secondary | ICD-10-CM | POA: Diagnosis not present

## 2023-01-27 LAB — LIPID PANEL
Cholesterol: 115 mg/dL (ref 0–200)
HDL: 56 mg/dL (ref >40)
LDL Cholesterol: 20 mg/dL (ref 0–99)
Total CHOL/HDL Ratio: 2.1 RATIO
Triglycerides: 193 mg/dL — ABNORMAL HIGH (ref <150)
VLDL: 39 mg/dL (ref 0–40)

## 2023-01-27 LAB — BASIC METABOLIC PANEL
Anion gap: 9 (ref 5–15)
BUN: 27 mg/dL — ABNORMAL HIGH (ref 8–23)
CO2: 25 mmol/L (ref 22–32)
Calcium: 8.3 mg/dL — ABNORMAL LOW (ref 8.9–10.3)
Chloride: 103 mmol/L (ref 98–111)
Creatinine, Ser: 2.25 mg/dL — ABNORMAL HIGH (ref 0.44–1.00)
GFR, Estimated: 24 mL/min — ABNORMAL LOW (ref >60)
Glucose, Bld: 141 mg/dL — ABNORMAL HIGH (ref 70–99)
Potassium: 3.7 mmol/L (ref 3.5–5.1)
Sodium: 137 mmol/L (ref 135–145)

## 2023-01-27 LAB — GLUCOSE, CAPILLARY: Glucose-Capillary: 109 mg/dL — ABNORMAL HIGH (ref 70–99)

## 2023-01-27 SURGERY — IMAGING PROCEDURE, GI TRACT, INTRALUMINAL, VIA CAPSULE

## 2023-01-27 MED ORDER — SODIUM CHLORIDE 0.9 % IV BOLUS
500.0000 mL | Freq: Once | INTRAVENOUS | Status: AC
  Administered 2023-01-27: 500 mL via INTRAVENOUS

## 2023-01-27 MED ORDER — TORSEMIDE 20 MG PO TABS: 40.0000 mg | ORAL_TABLET | Freq: Every day | ORAL | 1 refills | Status: DC

## 2023-01-27 NOTE — Progress Notes (Signed)
500cc bolus given and blood pressure 100/50

## 2023-01-27 NOTE — Progress Notes (Signed)
   01/27/23 0500  Assess: MEWS Score  BP (!) 76/40  Assess: MEWS Score  MEWS Temp 0  MEWS Systolic 2  MEWS Pulse 0  MEWS RR 0  MEWS LOC 0  MEWS Score 2  MEWS Score Color Yellow  Assess: if the MEWS score is Yellow or Red  Were vital signs taken at a resting state? Yes  Focused Assessment No change from prior assessment  Does the patient meet 2 or more of the SIRS criteria? No  MEWS guidelines implemented  Yes, yellow  Treat  MEWS Interventions Considered administering scheduled or prn medications/treatments as ordered  Take Vital Signs  Increase Vital Sign Frequency  Yellow: Q2hr x1, continue Q4hrs until patient remains green for 12hrs  Escalate  MEWS: Escalate Yellow: Discuss with charge nurse and consider notifying provider and/or RRT  Notify: Charge Nurse/RN  Name of Charge Nurse/RN Notified Roque Lias, RN  Provider Notification  Provider Name/Title Dr.  Carren Rang  Date Provider Notified 01/27/23  Time Provider Notified [phone]  Notification Reason Other (Comment) (hypotensive)  Provider response See new orders

## 2023-01-27 NOTE — Progress Notes (Signed)
Rounding Note    Patient Name: Meghan Terry Date of Encounter: 01/27/2023  [facility] Cardiologist: Marjo Bicker, MD   Subjective   SOB has resolved  Inpatient Medications    Scheduled Meds:  aspirin EC  81 mg Oral Daily   buPROPion  300 mg Oral Daily   dapagliflozin propanediol  10 mg Oral Daily   enoxaparin (LOVENOX) injection  30 mg Subcutaneous Q24H   levothyroxine  50 mcg Oral Daily   metoprolol succinate  12.5 mg Oral QHS   pantoprazole  40 mg Oral BID   ranolazine  1,000 mg Oral BID   rosuvastatin  40 mg Oral QHS   Continuous Infusions:  PRN Meds: acetaminophen **OR** acetaminophen, nitroGLYCERIN, ondansetron **OR** ondansetron (ZOFRAN) IV, polyethylene glycol, rOPINIRole, traMADol, traZODone   Vital Signs    Vitals:   01/27/23 0401 01/27/23 0500 01/27/23 0600 01/27/23 0735  BP: (!) 92/50 (!) 76/40 (!) 100/50 (!) 99/44  Pulse: 82   85  Resp: 18   17  Temp: 98.3 F (36.8 C)   98.3 F (36.8 C)  TempSrc:    Oral  SpO2: 100%   97%  Weight:  61.6 kg    Height:        Intake/Output Summary (Last 24 hours) at 01/27/2023 0945 Last data filed at 01/27/2023 0739 Gross per 24 hour  Intake 956.75 ml  Output 4000 ml  Net -3043.25 ml      01/27/2023    5:00 AM 01/26/2023    5:00 AM 01/25/2023    2:42 PM  Last 3 Weights  Weight (lbs) 135 lb 12.9 oz 136 lb 14.5 oz 146 lb  Weight (kg) 61.6 kg 62.1 kg 66.225 kg      Telemetry    NSR - Personally Reviewed  ECG    N/a - Personally Reviewed  Physical Exam   GEN: No acute distress.   Neck: No JVD Cardiac: RRR, no murmurs, rubs, or gallops.  Respiratory: Clear to auscultation bilaterally. GI: Soft, nontender, non-distended  MS: No edema; No deformity. Neuro:  Nonfocal  Psych: Normal affect   Labs    High Sensitivity Troponin:   Recent Labs  Lab 01/18/23 1451 01/18/23 1622  TROPONINIHS 4 4     Chemistry Recent Labs  Lab 01/25/23 1522 01/26/23 0359 01/27/23 0336  NA 136 136  137  K 3.2* 3.7 3.7  CL 104 104 103  CO2 24 24 25   GLUCOSE 125* 99 141*  BUN 18 22 27*  CREATININE 1.97* 2.11* 2.25*  CALCIUM 8.7* 8.5* 8.3*  MG 2.3  --   --   PROT 6.6  --   --   ALBUMIN 3.3*  --   --   AST 14*  --   --   ALT 11  --   --   ALKPHOS 64  --   --   BILITOT 0.4  --   --   GFRNONAA 28* 26* 24*  ANIONGAP 8 8 9     Lipids  Recent Labs  Lab 01/27/23 0336  CHOL 115  TRIG 193*  HDL 56  LDLCALC 20  CHOLHDL 2.1    Hematology Recent Labs  Lab 01/25/23 1522  WBC 6.6  RBC 3.15*  HGB 8.6*  HCT 28.5*  MCV 90.5  MCH 27.3  MCHC 30.2  RDW Not Measured  PLT 293   Thyroid No results for input(s): "TSH", "FREET4" in the last 168 hours.  BNP Recent Labs  Lab 01/25/23 1522  BNP 243.0*    DDimer No results for input(s): "DDIMER" in the last 168 hours.   Radiology    DG Chest Port 1 View  Result Date: 01/25/2023 CLINICAL DATA:  Exertional dyspnea EXAM: PORTABLE CHEST 1 VIEW COMPARISON:  CXR 01/18/23 FINDINGS: No pleural effusion. No pneumothorax. No focal airspace opacity. Normal cardiac and mediastinal contours. Status post median sternotomy CABG. No radiographically apparent displaced rib fractures. Visualized upper abdomen is notable for surgical clips in the right upper quadrant. IMPRESSION: No focal airspace opacity. Electronically Signed   By: Lorenza Cambridge M.D.   On: 01/25/2023 15:30    Cardiac Studies    Patient Profile     BRIHANNA DEVENPORT is a 62 y.o. female with a hx of CAD (s/p CABG in 2020 with LIMA-LAD, SVG-OM and SVG-PDA, prior subtotal occlusion of PDA which was felt to be due to spasm, DESx3 to RCA in 10/2019, cath in 12/2020 showing patent grafts and medical management recommended), HTN, HLD, Type II DM, Stage 4 CKD, anemia (EGD showing esophagitis and gastritis) and HFpEF who is being seen 01/26/2023 for the evaluation of CHF at the request of Dr. Wendall Stade.   Assessment & Plan    1.Acute on chronic HFpEF - 11/2022 echo: LVE 60-65%, no WMAs, grade  II dd, normal RV,  - negative 2.9 L yesterday and 4.5 L since admission. Received IV lasix 60mg  x 2 yesterday, uptrending Cr.  -reds vest 22% this AM. REports prior dry weight of 139 lbs, she is 135 lbs today. Euvolemic by exam and her symptoms have resolved - no additional IV diuretics, would change home regimen to torsemide 40mg  daily.  - already on farxiga at home,continue. GFR 25-30 right at borderline for use. Continue at this time.     2. CAD - prior CABG as reported above - no acute issues   3. CKD IIIB/IV - follow Cr with diuresis.   Ok for discharge, we will arrange outpatient f/u. We will sign off inpatient care  For questions or updates, please contact [facility] Please consult [email or web address] for contact info under        Signed, Dina Rich, MD  01/27/2023, 9:45 AM

## 2023-01-27 NOTE — Progress Notes (Signed)
Manual blood pressure 92/50. MD made aware. Will recheck in another hour.

## 2023-01-27 NOTE — Discharge Summary (Signed)
Physician Discharge Summary  Meghan Terry ZOX:[id number] DOB: 05-08-1961 DOA: 01/25/2023  PCP: Benita Stabile, MD  Admit date: 01/25/2023  Discharge date: 01/27/2023  Admitted From:Home  Disposition:  Home  Recommendations for Outpatient Follow-up:  Follow up with PCP in 1-2 weeks Follow-up with cardiology as recommended 5/16 at 11:40 AM Start torsemide 40 mg daily 5/2 Continue other home medications as prior  Home Health: None  Equipment/Devices: None  Discharge Condition:Stable  CODE STATUS: Full  Diet recommendation: Heart Healthy/carb modified  Brief/Interim Summary: Meghan Terry is a 62 y.o. female with medical history significant for diastolic CHF, CKD stage IV, hypertension, CABG, diabetes mellitus, hypertension and depression. She was referred to the ED from her cardiologist office for admission with the complaints of bilateral lower extremity swelling the past 2 to 3 weeks.  Her dose of Lasix was increased to 80 mg daily which she took for 5 days without significant improvement in swelling.  She was admitted with acute on chronic HFpEF and is now in stable condition for discharge and back to her usual dry weight near 135 pounds.  She will be transition to oral torsemide to start 5/2 and will follow-up with cardiology as scheduled on 5/16.  No other acute events or concerns noted.  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  Discharge Instructions  Discharge Instructions     Diet - low sodium heart healthy   Complete  by: As directed    Increase activity slowly   Complete by: As directed       Allergies as of 01/27/2023       Reactions   Abilify [aripiprazole]    Ciprofloxacin Nausea And Vomiting   Carbidopa-levodopa Other (See Comments)   Developed tics while taking   Prednisone Other (See Comments)   Turns red all over   Venlafaxine Other (See Comments)   Developed tics while taking - reaction to Effexor        Medication List     STOP taking these medications    furosemide 40 MG tablet Commonly known as: LASIX       TAKE these medications    Acetaminophen Extra Strength 500 MG Tabs Take 2 tablets by mouth 3 times daily as needed for pain   aspirin EC 81 MG tablet Take 1 tablet (81 mg total) by mouth daily.   buPROPion 300 MG 24 hr tablet Commonly known as: WELLBUTRIN XL Take 1 tablet (300 mg total) by mouth daily.   cholecalciferol 25 MCG (1000 UNIT) tablet Commonly known as: VITAMIN D3 Take 1 tablet (1,000 Units total) by mouth in the morning and at bedtime.   clopidogrel 75 MG tablet Commonly known as: PLAVIX TAKE 1 TABLET BY MOUTH DAILY What changed:  how much to take how to take this when to take this   cyanocobalamin 1000 MCG tablet Commonly known as: VITAMIN B12 Take 1 tablet (1,000 mcg total) by mouth daily.   dapagliflozin propanediol 10 MG Tabs tablet Commonly known as: Farxiga Take 1 tablet (10 mg total)  by mouth daily before breakfast.   ferrous sulfate 325 (65 FE) MG tablet Take 1 tablet (325 mg total) by mouth 2 (two) times daily with a meal.   folic acid 1 MG tablet Commonly known as: FOLVITE Take 1 tablet (1 mg total) by mouth daily.   levothyroxine 50 MCG tablet Commonly known as: SYNTHROID TAKE 1 TABLET BY MOUTH ONCE DAILY ON AN EMPTY STOMACH 30 MINUTES BEFORE BREAKFAST ON SAT AND SUNDAY What changed:  how much to take how to take this when to take this additional instructions   levothyroxine 75 MCG tablet Commonly known as:  SYNTHROID Take 1 tablet (75 mcg total) by mouth daily Monday thru Friday. What changed: Another medication with the same name was changed. Make sure you understand how and when to take each.   linaclotide 145 MCG Caps capsule Commonly known as: Linzess Take 1 capsule (145 mcg total) by mouth daily before breakfast.   metoprolol succinate 25 MG 24 hr tablet Commonly known as: TOPROL-XL Take 0.5 tablets (12.5 mg total) by mouth at bedtime. Take with or immediately following a meal.   nitroGLYCERIN 0.4 MG SL tablet Commonly known as: NITROSTAT Place 1 tablet (0.4 mg total) under the tongue every 5 (five) minutes as needed for chest pain.   ondansetron 4 MG tablet Commonly known as: ZOFRAN take 1 tablet (4 mg) by mouth 2 times per day as needed for nausea What changed:  how much to take how to take this when to take this reasons to take this   pantoprazole 40 MG tablet Commonly known as: PROTONIX Take 1 tablet (40 mg total) by mouth 2 (two) times daily.   promethazine 25 MG tablet Commonly known as: PHENERGAN Take 1 tablet (25 mg total) by mouth every 6 (six) hours as needed for nausea or vomiting.   ranolazine 500 MG 12 hr tablet Commonly known as: RANEXA Take 1 tablet by mouth 2 times daily   rOPINIRole 4 MG tablet Commonly known as: REQUIP Take 1 tablet (4 mg total) by mouth 1 to 3 hours before bedtime.   rOPINIRole 1 MG tablet Commonly known as: REQUIP Take 1 tablet (1 mg total) by mouth 3 (three) times daily.   rosuvastatin 40 MG tablet Commonly known as: CRESTOR Take 1 tablet (40 mg total) by mouth at bedtime.   topiramate 100 MG tablet Commonly known as: TOPAMAX TAKE 1 TABLET BY MOUTH TWICE DAILY.   torsemide 20 MG tablet Commonly known as: DEMADEX Take 2 tablets (40 mg total) by mouth daily. Start taking on: Jan 28, 2023   traMADol 50 MG tablet Commonly known as: ULTRAM Take 1 tablet (50 mg total) by mouth every 6 (six) hours. What changed:  when to  take this reasons to take this   traZODone 50 MG tablet Commonly known as: DESYREL Take 1 tablet (50 mg total) by mouth daily.   traZODone 150 MG tablet Commonly known as: DESYREL Take 1 tablet (150 mg total) by mouth in the morning and at bedtime.        Follow-up Information     Mallipeddi, Orion Modest, MD Follow up on 02/11/2023.   Specialties: Cardiology, Internal Medicine Why: Cardiology Follow-up on 02/11/2023 at 11:40 AM. Contact information: [address] [place] [address] [phone]         Benita Stabile, MD. Schedule an appointment as soon as possible for a visit in 1 week(s).   Specialty: Internal Medicine Contact information: [address] [place]  [place] [address] [phone]                Allergies  Allergen Reactions   Abilify [Aripiprazole]    Ciprofloxacin Nausea And Vomiting   Carbidopa-Levodopa Other (See Comments)    Developed tics while taking    Prednisone Other (See Comments)    Turns red all over    Venlafaxine Other (See Comments)    Developed tics while taking - reaction to Effexor     Consultations: Cardiology   Procedures/Studies: DG Chest Port 1 View  Result Date: 01/25/2023 CLINICAL DATA:  Exertional dyspnea EXAM: PORTABLE CHEST 1 VIEW COMPARISON:  CXR 01/18/23 FINDINGS: No pleural effusion. No pneumothorax. No focal airspace opacity. Normal cardiac and mediastinal contours. Status post median sternotomy CABG. No radiographically apparent displaced rib fractures. Visualized upper abdomen is notable for surgical clips in the right upper quadrant. IMPRESSION: No focal airspace opacity. Electronically Signed   By: Lorenza Cambridge M.D.   On: 01/25/2023 15:30   DG Abd 1 View  Result Date: 01/21/2023 CLINICAL DATA:  Constipation for 1 year, nausea, vomiting EXAM: ABDOMEN - 1 VIEW COMPARISON:  None Available. FINDINGS: Surgical clips RIGHT upper quadrant consistent with cholecystectomy. Additional surgical clips project over  the T11 vertebral body. Nonobstructive bowel gas pattern. No bowel dilatation or bowel wall thickening. Vascular calcifications LEFT iliac vessels. No acute bony findings. IMPRESSION: No acute abnormalities. Electronically Signed   By: Ulyses Southward M.D.   On: 01/21/2023 16:34   US RENAL  Result Date: 01/21/2023 CLINICAL DATA:  Chronic renal disease EXAM: RENAL / URINARY TRACT ULTRASOUND COMPLETE COMPARISON:  Renal ultrasound 06/04/2021 FINDINGS: Right Kidney: Renal measurements: 7.5 x 3.9 x 3.6 cm = volume: 55 mL. Echogenicity within normal limits. No mass or hydronephrosis visualized. Left Kidney: Renal measurements: 9.5 x 4.2 x 3.4 cm = volume: 71 mL. Echogenicity within normal limits. No mass or hydronephrosis visualized. Bladder: Appears normal for degree of bladder distention. Other: Prevoid: 108 cc Postvoid: 0 cc IMPRESSION: No hydronephrosis. Electronically Signed   By: Annia Belt M.D.   On: 01/21/2023 14:27   US Venous Img Lower Unilateral Right  Result Date: 01/18/2023 CLINICAL DATA:  Right lower extremity pain and edema EXAM: RIGHT LOWER EXTREMITY VENOUS DOPPLER ULTRASOUND TECHNIQUE: Gray-scale sonography with graded compression, as well as color Doppler and duplex ultrasound were performed to evaluate the lower extremity deep venous systems from the level of the common femoral vein and including the common femoral, femoral, profunda femoral, popliteal and calf veins including the posterior tibial, peroneal and gastrocnemius veins when visible. Spectral Doppler was utilized to evaluate flow at rest and with distal augmentation maneuvers in the common femoral, femoral and popliteal veins. COMPARISON:  None Available. FINDINGS: Contralateral Common Femoral Vein: Respiratory phasicity is normal and symmetric with the symptomatic side. No evidence of thrombus. Normal compressibility. Common Femoral Vein: No evidence of thrombus. Normal compressibility, respiratory phasicity and response to  augmentation. Saphenofemoral Junction: No evidence of thrombus. Normal compressibility and flow on color Doppler imaging. Profunda Femoral Vein: No evidence of thrombus. Normal compressibility and flow on color Doppler imaging. Femoral Vein: No evidence of thrombus. Normal compressibility, respiratory phasicity and response to augmentation. Popliteal Vein: No evidence of thrombus. Normal compressibility, respiratory phasicity and response to augmentation. Calf Veins: No evidence of thrombus. Normal compressibility and flow on color Doppler imaging. Other Findings:  Peripheral extremity edema noted. IMPRESSION: No evidence of deep venous thrombosis. Electronically Signed   By: Judie Petit.  Shick M.D.  On: 01/18/2023 14:54   DG Chest 2 View  Result Date: 01/18/2023 CLINICAL DATA:  Bilateral leg swelling. EXAM: CHEST - 2 VIEW COMPARISON:  12/21/2022. FINDINGS: No consolidation or pulmonary edema. Stable cardiac and mediastinal contours with postoperative changes of median sternotomy and CABG. No pleural effusion or pneumothorax. Visualized bones and upper abdomen unremarkable. IMPRESSION: No evidence of acute cardiopulmonary disease. Electronically Signed   By: Orvan Falconer M.D.   On: 01/18/2023 10:30     Discharge Exam: Vitals:   01/27/23 0600 01/27/23 0735  BP: (!) 100/50 (!) 99/44  Pulse:  85  Resp:  17  Temp:  98.3 F (36.8 C)  SpO2:  97%   Vitals:   01/27/23 0401 01/27/23 0500 01/27/23 0600 01/27/23 0735  BP: (!) 92/50 (!) 76/40 (!) 100/50 (!) 99/44  Pulse: 82   85  Resp: 18   17  Temp: 98.3 F (36.8 C)   98.3 F (36.8 C)  TempSrc:    Oral  SpO2: 100%   97%  Weight:  61.6 kg    Height:        General: Pt is alert, awake, not in acute distress Cardiovascular: RRR, S1/S2 +, no rubs, no gallops Respiratory: CTA bilaterally, no wheezing, no rhonchi Abdominal: Soft, NT, ND, bowel sounds + Extremities: no edema, no cyanosis    The results of significant diagnostics from this  hospitalization (including imaging, microbiology, ancillary and laboratory) are listed below for reference.     Microbiology: Recent Results (from the past 240 hour(s))  Resp panel by RT-PCR (RSV, Flu A&B, Covid) Anterior Nasal Swab     Status: None   Collection Time: 01/25/23  2:56 PM   Specimen: Anterior Nasal Swab  Result Value Ref Range Status   SARS Coronavirus 2 by RT PCR NEGATIVE NEGATIVE Final    Comment: (NOTE) SARS-CoV-2 target nucleic acids are NOT DETECTED.  The SARS-CoV-2 RNA is generally detectable in upper respiratory specimens during the acute phase of infection. The lowest concentration of SARS-CoV-2 viral copies this assay can detect is 138 copies/mL. A negative result does not preclude SARS-Cov-2 infection and should not be used as the sole basis for treatment or other patient management decisions. A negative result may occur with  improper specimen collection/handling, submission of specimen other than nasopharyngeal swab, presence of viral mutation(s) within the areas targeted by this assay, and inadequate number of viral copies(<138 copies/mL). A negative result must be combined with clinical observations, patient history, and epidemiological information. The expected result is Negative.  Fact Sheet for Patients:  BloggerCourse.com  Fact Sheet for Healthcare Providers:  SeriousBroker.it  This test is no t yet approved or cleared by the [place] FDA and  has been authorized for detection and/or diagnosis of SARS-CoV-2 by FDA under an Emergency Use Authorization (EUA). This EUA will remain  in effect (meaning this test can be used) for the duration of the COVID-19 declaration under Section 564(b)(1) of the Act, 21 U.S.C.section 360bbb-3(b)(1), unless the authorization is terminated  or revoked sooner.       Influenza A by PCR NEGATIVE NEGATIVE Final   Influenza B by PCR NEGATIVE NEGATIVE Final     Comment: (NOTE) The Xpert Xpress SARS-CoV-2/FLU/RSV plus assay is intended as an aid in the diagnosis of influenza from Nasopharyngeal swab specimens and should not be used as a sole basis for treatment. Nasal washings and aspirates are unacceptable for Xpert Xpress SARS-CoV-2/FLU/RSV testing.  Fact Sheet for Patients: BloggerCourse.com  Fact Sheet for Healthcare  Providers: SeriousBroker.it  This test is not yet approved or cleared by the [place] and has been authorized for detection and/or diagnosis of SARS-CoV-2 by FDA under an Emergency Use Authorization (EUA). This EUA will remain in effect (meaning this test can be used) for the duration of the COVID-19 declaration under Section 564(b)(1) of the Act, 21 U.S.C. section 360bbb-3(b)(1), unless the authorization is terminated or revoked.     Resp Syncytial Virus by PCR NEGATIVE NEGATIVE Final    Comment: (NOTE) Fact Sheet for Patients: BloggerCourse.com  Fact Sheet for Healthcare Providers: SeriousBroker.it  This test is not yet approved or cleared by the [place] FDA and has been authorized for detection and/or diagnosis of SARS-CoV-2 by FDA under an Emergency Use Authorization (EUA). This EUA will remain in effect (meaning this test can be used) for the duration of the COVID-19 declaration under Section 564(b)(1) of the Act, 21 U.S.C. section 360bbb-3(b)(1), unless the authorization is terminated or revoked.  Performed at [facility], [address], [place], [place] [address]      Labs: BNP (last 3 results) Recent Labs    12/21/22 1313 01/18/23 1059 01/25/23 1522  BNP 46.0 178.0* 243.0*   Basic Metabolic Panel: Recent Labs  Lab 01/25/23 1522 01/26/23 0359 01/27/23 0336  NA 136 136 137  K 3.2* 3.7 3.7  CL 104 104 103  CO2 24 24 25   GLUCOSE 125* 99 141*  BUN 18 22 27*  CREATININE 1.97* 2.11*  2.25*  CALCIUM 8.7* 8.5* 8.3*  MG 2.3  --   --    Liver Function Tests: Recent Labs  Lab 01/25/23 1522  AST 14*  ALT 11  ALKPHOS 64  BILITOT 0.4  PROT 6.6  ALBUMIN 3.3*   No results for input(s): "LIPASE", "AMYLASE" in the last 168 hours. No results for input(s): "AMMONIA" in the last 168 hours. CBC: Recent Labs  Lab 01/25/23 1522  WBC 6.6  NEUTROABS 4.5  HGB 8.6*  HCT 28.5*  MCV 90.5  PLT 293   Cardiac Enzymes: No results for input(s): "CKTOTAL", "CKMB", "CKMBINDEX", "TROPONINI" in the last 168 hours. BNP: Invalid input(s): "POCBNP" CBG: Recent Labs  Lab 01/27/23 0737  GLUCAP 109*   D-Dimer No results for input(s): "DDIMER" in the last 72 hours. Hgb A1c No results for input(s): "HGBA1C" in the last 72 hours. Lipid Profile Recent Labs    01/27/23 0336  CHOL 115  HDL 56  LDLCALC 20  TRIG 193*  CHOLHDL 2.1   Thyroid function studies No results for input(s): "TSH", "T4TOTAL", "T3FREE", "THYROIDAB" in the last 72 hours.  Invalid input(s): "FREET3" Anemia work up No results for input(s): "VITAMINB12", "FOLATE", "FERRITIN", "TIBC", "IRON", "RETICCTPCT" in the last 72 hours. Urinalysis    Component Value Date/Time   COLORURINE YELLOW 01/25/2023 1410   APPEARANCEUR CLEAR 01/25/2023 1410   LABSPEC 1.006 01/25/2023 1410   PHURINE 7.0 01/25/2023 1410   GLUCOSEU >=500 (A) 01/25/2023 1410   HGBUR NEGATIVE 01/25/2023 1410   BILIRUBINUR NEGATIVE 01/25/2023 1410   KETONESUR NEGATIVE 01/25/2023 1410   PROTEINUR NEGATIVE 01/25/2023 1410   NITRITE NEGATIVE 01/25/2023 1410   LEUKOCYTESUR NEGATIVE 01/25/2023 1410   Sepsis Labs Recent Labs  Lab 01/25/23 1522  WBC 6.6   Microbiology Recent Results (from the past 240 hour(s))  Resp panel by RT-PCR (RSV, Flu A&B, Covid) Anterior Nasal Swab     Status: None   Collection Time: 01/25/23  2:56 PM   Specimen: Anterior Nasal Swab  Result Value Ref  Range Status   SARS Coronavirus 2 by RT PCR NEGATIVE NEGATIVE  Final    Comment: (NOTE) SARS-CoV-2 target nucleic acids are NOT DETECTED.  The SARS-CoV-2 RNA is generally detectable in upper respiratory specimens during the acute phase of infection. The lowest concentration of SARS-CoV-2 viral copies this assay can detect is 138 copies/mL. A negative result does not preclude SARS-Cov-2 infection and should not be used as the sole basis for treatment or other patient management decisions. A negative result may occur with  improper specimen collection/handling, submission of specimen other than nasopharyngeal swab, presence of viral mutation(s) within the areas targeted by this assay, and inadequate number of viral copies(<138 copies/mL). A negative result must be combined with clinical observations, patient history, and epidemiological information. The expected result is Negative.  Fact Sheet for Patients:  BloggerCourse.com  Fact Sheet for Healthcare Providers:  SeriousBroker.it  This test is no t yet approved or cleared by the [place] FDA and  has been authorized for detection and/or diagnosis of SARS-CoV-2 by FDA under an Emergency Use Authorization (EUA). This EUA will remain  in effect (meaning this test can be used) for the duration of the COVID-19 declaration under Section 564(b)(1) of the Act, 21 U.S.C.section 360bbb-3(b)(1), unless the authorization is terminated  or revoked sooner.       Influenza A by PCR NEGATIVE NEGATIVE Final   Influenza B by PCR NEGATIVE NEGATIVE Final    Comment: (NOTE) The Xpert Xpress SARS-CoV-2/FLU/RSV plus assay is intended as an aid in the diagnosis of influenza from Nasopharyngeal swab specimens and should not be used as a sole basis for treatment. Nasal washings and aspirates are unacceptable for Xpert Xpress SARS-CoV-2/FLU/RSV testing.  Fact Sheet for Patients: BloggerCourse.com  Fact Sheet for Healthcare  Providers: SeriousBroker.it  This test is not yet approved or cleared by the [place] FDA and has been authorized for detection and/or diagnosis of SARS-CoV-2 by FDA under an Emergency Use Authorization (EUA). This EUA will remain in effect (meaning this test can be used) for the duration of the COVID-19 declaration under Section 564(b)(1) of the Act, 21 U.S.C. section 360bbb-3(b)(1), unless the authorization is terminated or revoked.     Resp Syncytial Virus by PCR NEGATIVE NEGATIVE Final    Comment: (NOTE) Fact Sheet for Patients: BloggerCourse.com  Fact Sheet for Healthcare Providers: SeriousBroker.it  This test is not yet approved or cleared by the [place] FDA and has been authorized for detection and/or diagnosis of SARS-CoV-2 by FDA under an Emergency Use Authorization (EUA). This EUA will remain in effect (meaning this test can be used) for the duration of the COVID-19 declaration under Section 564(b)(1) of the Act, 21 U.S.C. section 360bbb-3(b)(1), unless the authorization is terminated or revoked.  Performed at [facility], [address] [place], [place] [address]      Time coordinating discharge: 35 minutes  SIGNED:   Erick Blinks, DO [facility] 01/27/2023, 11:01 AM  If 7PM-7AM, please contact night-coverage [email or web address]

## 2023-02-02 ENCOUNTER — Encounter (INDEPENDENT_AMBULATORY_CARE_PROVIDER_SITE_OTHER): Payer: Self-pay

## 2023-02-02 ENCOUNTER — Ambulatory Visit
Admission: RE | Admit: 2023-02-02 | Discharge: 2023-02-02 | Disposition: A | Discharge status: Home / Self Care | Payer: Medicare Other | Source: Ambulatory Visit | Attending: Neurology | Admitting: Neurology

## 2023-02-02 ENCOUNTER — Encounter: Payer: Self-pay | Admitting: Neurology

## 2023-02-02 ENCOUNTER — Telehealth: Payer: Self-pay | Admitting: Neurology

## 2023-02-02 ENCOUNTER — Ambulatory Visit: Payer: Medicare Other | Admitting: Neurology

## 2023-02-02 VITALS — BP 110/56 | HR 89 | Ht 63.0 in | Wt 140.0 lb

## 2023-02-02 DIAGNOSIS — R410 Disorientation, unspecified: Secondary | ICD-10-CM

## 2023-02-02 DIAGNOSIS — Z87828 Personal history of other (healed) physical injury and trauma: Secondary | ICD-10-CM

## 2023-02-02 DIAGNOSIS — R4189 Other symptoms and signs involving cognitive functions and awareness: Secondary | ICD-10-CM | POA: Diagnosis not present

## 2023-02-02 HISTORY — DX: Other symptoms and signs involving cognitive functions and awareness: R41.89

## 2023-02-02 MED ORDER — GABAPENTIN 100 MG PO CAPS: 300.0000 mg | ORAL_CAPSULE | Freq: Three times a day (TID) | ORAL | 5 refills | Status: DC | PRN

## 2023-02-02 NOTE — Telephone Encounter (Signed)
[employer] medicare NPR sent to GI [phone] 
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
## 2023-02-02 NOTE — Patient Instructions (Signed)
[facility]    Address: [address], , [place] [address]  Phone: [phone]   

## 2023-02-03 ENCOUNTER — Telehealth: Payer: Self-pay | Admitting: Internal Medicine

## 2023-02-03 NOTE — Telephone Encounter (Signed)
Pt c/o medication issue:  1. Name of Medication: ranolazine (RANEXA) 500 MG 12 hr tablet   2. How are you currently taking this medication (dosage and times per day)?   Take 1 tablet by mouth 2 times dailyPatient taking differently: Take 1,000 mg by mouth.    3. Are you having a reaction (difficulty breathing--STAT)? no  4. What is your medication issue? States the medication is suppose to be 1000mg  a pill not 500mg . States that it was suppose to be change in the hospital. Please advise

## 2023-02-03 NOTE — Telephone Encounter (Signed)
Per Dr. Henrietta Hoover pt is supposed to take Ranexa 500 mg BID. Pt is taking 1000 mg BID. Please advise.

## 2023-02-04 MED ORDER — RANOLAZINE ER 1000 MG PO TB12: 1000.0000 mg | ORAL_TABLET | Freq: Two times a day (BID) | ORAL | 3 refills | Status: DC

## 2023-02-04 NOTE — Telephone Encounter (Signed)
Per pt:  Please use [facility]. Thank you so much.   New RX sent to patients pharmacy.

## 2023-02-04 NOTE — Telephone Encounter (Signed)
Message sent to patient. Awaiting verification to send to pt's normal pharmacy or if she prefers [facility].

## 2023-02-09 ENCOUNTER — Telehealth: Payer: Self-pay | Admitting: Internal Medicine

## 2023-02-09 NOTE — Addendum Note (Signed)
Addended by: Levert Feinstein on: 02/09/2023 03:12 PM   Modules accepted: Orders

## 2023-02-09 NOTE — Telephone Encounter (Signed)
   Pre-operative Risk Assessment    Patient Name: Meghan Terry  DOB: 08/20/1961 MRN: [id number]      Request for Surgical Clearance    Procedure:   Left and Right   Date of Surgery:  Clearance 05/17/23                                 Surgeon:  Dr. Mia Creek Surgeon's Group or Practice Name:  [facility] Phone number:  [phone] Fax number:  [phone]   Type of Clearance Requested:   - Medical    Type of Anesthesia:  Not Indicated   Additional requests/questions:    [name]   02/09/2023, 4:33 PM

## 2023-02-10 NOTE — Telephone Encounter (Signed)
Please clarify details of surgical procedure

## 2023-02-10 NOTE — Telephone Encounter (Signed)
   Patient Name: Meghan Terry  DOB: 1961/06/27 MRN: [id number]  Primary Cardiologist: Marjo Bicker, MD  Chart reviewed as part of pre-operative protocol coverage. Cataract extractions are recognized in guidelines as low risk surgeries that do not typically require specific preoperative testing or holding of blood thinner therapy. Therefore, given past medical history and time since last visit, based on ACC/AHA guidelines, [name] would be at acceptable risk for the planned procedure without further cardiovascular testing.   I will route this recommendation to the requesting party via Epic fax function and remove from pre-op pool.  Please call with questions.  Levi Aland, NP-C  02/10/2023, 4:12 PM [address] Office [phone] Fax [phone]

## 2023-02-10 NOTE — Telephone Encounter (Signed)
Cataract surgery. Left eye Aug 19, right eye Sept 9.  S/w Cordelia Pen, schedule coordinator.

## 2023-02-11 ENCOUNTER — Ambulatory Visit: Payer: Medicare Other | Admitting: Internal Medicine

## 2023-02-11 ENCOUNTER — Telehealth: Payer: Self-pay | Admitting: Internal Medicine

## 2023-02-11 MED ORDER — TORSEMIDE 20 MG PO TABS: 40.0000 mg | ORAL_TABLET | Freq: Two times a day (BID) | ORAL | 3 refills | Status: DC

## 2023-02-11 MED ORDER — POTASSIUM CHLORIDE CRYS ER 20 MEQ PO TBCR: 40.0000 meq | EXTENDED_RELEASE_TABLET | Freq: Every day | ORAL | 3 refills | Status: DC

## 2023-02-11 NOTE — Telephone Encounter (Signed)
Pt reports swelling since getting out the hospital. Patient is taking 40 mg of Torsemide daily. Pt c/o SOB that is worse when she moves and being tired. Pt reports that she has gained 11 pounds since leaving the hospital. Please advise.

## 2023-02-11 NOTE — Telephone Encounter (Signed)
Pt notified to increase Torsemide to 40 mg BID and to start KCL 40 mEq daily. Pt voiced understanding and agrees with plan of care. If no improvement she will go to the ER for evaluation.

## 2023-02-11 NOTE — Telephone Encounter (Signed)
Pt c/o swelling: STAT is pt has developed SOB within 24 hours  If swelling, where is the swelling located? Knee all the way down both of her legs, also swelling in both thighs  How much weight have you gained and in what time span? 11lbs since 5-1-24in   Have you gained 3 pounds in a day or 5 pounds in a week?   Do you have a log of your daily weights (if so, list)?   Are you currently taking a fluid pill? yes  Are you currently SOB?  Yes-  when she moves around-getting worse   Have you traveled recently? no

## 2023-02-17 ENCOUNTER — Telehealth (INDEPENDENT_AMBULATORY_CARE_PROVIDER_SITE_OTHER): Payer: Self-pay | Admitting: *Deleted

## 2023-02-17 ENCOUNTER — Other Ambulatory Visit (INDEPENDENT_AMBULATORY_CARE_PROVIDER_SITE_OTHER): Payer: Self-pay | Admitting: *Deleted

## 2023-02-17 DIAGNOSIS — D649 Anemia, unspecified: Secondary | ICD-10-CM

## 2023-02-17 NOTE — Telephone Encounter (Signed)
Patient notified she was in reminder file for repeat labs due 02/18/23 per Courtney  CBC, anemia panel (iron, iron sat, ferritin, B12 and folate) . Pt is aware lab orders are in for [facility] and states she will get them done.

## 2023-02-18 ENCOUNTER — Encounter (HOSPITAL_COMMUNITY): Payer: Self-pay | Admitting: Emergency Medicine

## 2023-02-18 ENCOUNTER — Other Ambulatory Visit: Payer: Self-pay

## 2023-02-18 ENCOUNTER — Inpatient Hospital Stay (HOSPITAL_COMMUNITY)
Admission: EM | Admit: 2023-02-18 | Discharge: 2023-02-21 | DRG: 291 | Disposition: A | Discharge status: Home Health | Payer: Medicare Other | Attending: Family Medicine | Admitting: Family Medicine

## 2023-02-18 ENCOUNTER — Inpatient Hospital Stay (HOSPITAL_COMMUNITY): Payer: Medicare Other

## 2023-02-18 ENCOUNTER — Emergency Department (HOSPITAL_COMMUNITY): Payer: Medicare Other

## 2023-02-18 DIAGNOSIS — E039 Hypothyroidism, unspecified: Secondary | ICD-10-CM | POA: Diagnosis present

## 2023-02-18 DIAGNOSIS — Z66 Do not resuscitate: Secondary | ICD-10-CM | POA: Diagnosis present

## 2023-02-18 DIAGNOSIS — E785 Hyperlipidemia, unspecified: Secondary | ICD-10-CM | POA: Diagnosis present

## 2023-02-18 DIAGNOSIS — Z951 Presence of aortocoronary bypass graft: Secondary | ICD-10-CM

## 2023-02-18 DIAGNOSIS — I130 Hypertensive heart and chronic kidney disease with heart failure and stage 1 through stage 4 chronic kidney disease, or unspecified chronic kidney disease: Secondary | ICD-10-CM | POA: Diagnosis not present

## 2023-02-18 DIAGNOSIS — Z825 Family history of asthma and other chronic lower respiratory diseases: Secondary | ICD-10-CM

## 2023-02-18 DIAGNOSIS — Z79899 Other long term (current) drug therapy: Secondary | ICD-10-CM | POA: Diagnosis not present

## 2023-02-18 DIAGNOSIS — Z888 Allergy status to other drugs, medicaments and biological substances status: Secondary | ICD-10-CM

## 2023-02-18 DIAGNOSIS — G2581 Restless legs syndrome: Secondary | ICD-10-CM | POA: Diagnosis present

## 2023-02-18 DIAGNOSIS — Z7902 Long term (current) use of antithrombotics/antiplatelets: Secondary | ICD-10-CM

## 2023-02-18 DIAGNOSIS — I25119 Atherosclerotic heart disease of native coronary artery with unspecified angina pectoris: Secondary | ICD-10-CM | POA: Diagnosis not present

## 2023-02-18 DIAGNOSIS — I129 Hypertensive chronic kidney disease with stage 1 through stage 4 chronic kidney disease, or unspecified chronic kidney disease: Secondary | ICD-10-CM | POA: Diagnosis present

## 2023-02-18 DIAGNOSIS — I509 Heart failure, unspecified: Principal | ICD-10-CM

## 2023-02-18 DIAGNOSIS — R54 Age-related physical debility: Secondary | ICD-10-CM | POA: Diagnosis present

## 2023-02-18 DIAGNOSIS — Z8249 Family history of ischemic heart disease and other diseases of the circulatory system: Secondary | ICD-10-CM

## 2023-02-18 DIAGNOSIS — Z833 Family history of diabetes mellitus: Secondary | ICD-10-CM

## 2023-02-18 DIAGNOSIS — E1122 Type 2 diabetes mellitus with diabetic chronic kidney disease: Secondary | ICD-10-CM | POA: Diagnosis present

## 2023-02-18 DIAGNOSIS — Z90710 Acquired absence of both cervix and uterus: Secondary | ICD-10-CM

## 2023-02-18 DIAGNOSIS — Z87891 Personal history of nicotine dependence: Secondary | ICD-10-CM

## 2023-02-18 DIAGNOSIS — I2510 Atherosclerotic heart disease of native coronary artery without angina pectoris: Secondary | ICD-10-CM | POA: Diagnosis present

## 2023-02-18 DIAGNOSIS — Z79890 Hormone replacement therapy (postmenopausal): Secondary | ICD-10-CM

## 2023-02-18 DIAGNOSIS — Z955 Presence of coronary angioplasty implant and graft: Secondary | ICD-10-CM

## 2023-02-18 DIAGNOSIS — Z881 Allergy status to other antibiotic agents status: Secondary | ICD-10-CM | POA: Diagnosis not present

## 2023-02-18 DIAGNOSIS — F32A Depression, unspecified: Secondary | ICD-10-CM | POA: Diagnosis present

## 2023-02-18 DIAGNOSIS — D631 Anemia in chronic kidney disease: Secondary | ICD-10-CM | POA: Diagnosis present

## 2023-02-18 DIAGNOSIS — I5033 Acute on chronic diastolic (congestive) heart failure: Secondary | ICD-10-CM | POA: Diagnosis present

## 2023-02-18 DIAGNOSIS — I252 Old myocardial infarction: Secondary | ICD-10-CM | POA: Diagnosis not present

## 2023-02-18 DIAGNOSIS — N184 Chronic kidney disease, stage 4 (severe): Secondary | ICD-10-CM | POA: Diagnosis present

## 2023-02-18 LAB — BASIC METABOLIC PANEL
Anion gap: 10 (ref 5–15)
BUN: 28 mg/dL — ABNORMAL HIGH (ref 8–23)
CO2: 23 mmol/L (ref 22–32)
Calcium: 8.6 mg/dL — ABNORMAL LOW (ref 8.9–10.3)
Chloride: 102 mmol/L (ref 98–111)
Creatinine, Ser: 2.28 mg/dL — ABNORMAL HIGH (ref 0.44–1.00)
GFR, Estimated: 24 mL/min — ABNORMAL LOW (ref >60)
Glucose, Bld: 153 mg/dL — ABNORMAL HIGH (ref 70–99)
Potassium: 3.8 mmol/L (ref 3.5–5.1)
Sodium: 135 mmol/L (ref 135–145)

## 2023-02-18 LAB — CBC
HCT: 29.4 % — ABNORMAL LOW (ref 36.0–46.0)
Hemoglobin: 9 g/dL — ABNORMAL LOW (ref 12.0–15.0)
MCH: 29.7 pg (ref 26.0–34.0)
MCHC: 30.6 g/dL (ref 30.0–36.0)
MCV: 97 fL (ref 80.0–100.0)
Platelets: 259 10*3/uL (ref 150–400)
RBC: 3.03 MIL/uL — ABNORMAL LOW (ref 3.87–5.11)
RDW: 18.9 % — ABNORMAL HIGH (ref 11.5–15.5)
WBC: 8.4 10*3/uL (ref 4.0–10.5)
nRBC: 0 % (ref 0.0–0.2)

## 2023-02-18 LAB — BRAIN NATRIURETIC PEPTIDE: B Natriuretic Peptide: 56 pg/mL (ref 0.0–100.0)

## 2023-02-18 LAB — CBG MONITORING, ED: Glucose-Capillary: 102 mg/dL — ABNORMAL HIGH (ref 70–99)

## 2023-02-18 MED ORDER — INSULIN ASPART 100 UNIT/ML IJ SOLN: 0.0000 [IU] | Freq: Every day | INTRAMUSCULAR | Status: DC

## 2023-02-18 MED ORDER — TOPIRAMATE 100 MG PO TABS
100.0000 mg | ORAL_TABLET | Freq: Two times a day (BID) | ORAL | Status: DC
  Administered 2023-02-18 – 2023-02-21 (×6): 100 mg via ORAL
  Filled 2023-02-18 (×7): qty 1

## 2023-02-18 MED ORDER — METOPROLOL SUCCINATE ER 25 MG PO TB24
12.5000 mg | ORAL_TABLET | Freq: Every day | ORAL | Status: DC
  Administered 2023-02-18 – 2023-02-20 (×3): 12.5 mg via ORAL
  Filled 2023-02-18 (×3): qty 1

## 2023-02-18 MED ORDER — ACETAMINOPHEN 650 MG RE SUPP: 650.0000 mg | Freq: Four times a day (QID) | RECTAL | Status: DC | PRN

## 2023-02-18 MED ORDER — ROSUVASTATIN CALCIUM 20 MG PO TABS
40.0000 mg | ORAL_TABLET | Freq: Every day | ORAL | Status: DC
  Administered 2023-02-18 – 2023-02-20 (×3): 40 mg via ORAL
  Filled 2023-02-18 (×5): qty 2

## 2023-02-18 MED ORDER — TRAZODONE HCL 50 MG PO TABS
200.0000 mg | ORAL_TABLET | Freq: Every evening | ORAL | Status: DC | PRN
  Administered 2023-02-18 – 2023-02-20 (×3): 200 mg via ORAL
  Filled 2023-02-18 (×3): qty 4

## 2023-02-18 MED ORDER — TRAMADOL HCL 50 MG PO TABS
50.0000 mg | ORAL_TABLET | Freq: Every day | ORAL | Status: DC | PRN
  Administered 2023-02-19 – 2023-02-20 (×2): 50 mg via ORAL
  Filled 2023-02-18 (×3): qty 1

## 2023-02-18 MED ORDER — HYDRALAZINE HCL 20 MG/ML IJ SOLN: 5.0000 mg | INTRAMUSCULAR | Status: DC | PRN

## 2023-02-18 MED ORDER — FUROSEMIDE 10 MG/ML IJ SOLN
80.0000 mg | Freq: Once | INTRAMUSCULAR | Status: AC
  Administered 2023-02-18: 80 mg via INTRAVENOUS
  Filled 2023-02-18: qty 8

## 2023-02-18 MED ORDER — HEPARIN SODIUM (PORCINE) 5000 UNIT/ML IJ SOLN
5000.0000 [IU] | Freq: Three times a day (TID) | INTRAMUSCULAR | Status: DC
  Administered 2023-02-19 – 2023-02-21 (×6): 5000 [IU] via SUBCUTANEOUS
  Filled 2023-02-18 (×7): qty 1

## 2023-02-18 MED ORDER — INSULIN ASPART 100 UNIT/ML IJ SOLN
0.0000 [IU] | Freq: Three times a day (TID) | INTRAMUSCULAR | Status: DC
  Administered 2023-02-19 – 2023-02-20 (×3): 1 [IU] via SUBCUTANEOUS

## 2023-02-18 MED ORDER — FOLIC ACID 1 MG PO TABS
1.0000 mg | ORAL_TABLET | Freq: Every day | ORAL | Status: DC
  Administered 2023-02-19 – 2023-02-21 (×3): 1 mg via ORAL
  Filled 2023-02-18 (×3): qty 1

## 2023-02-18 MED ORDER — FERROUS SULFATE 325 (65 FE) MG PO TABS: 325.0000 mg | ORAL_TABLET | Freq: Two times a day (BID) | ORAL | Status: DC

## 2023-02-18 MED ORDER — ASPIRIN 81 MG PO TBEC
81.0000 mg | DELAYED_RELEASE_TABLET | Freq: Every day | ORAL | Status: DC
  Administered 2023-02-19 – 2023-02-21 (×3): 81 mg via ORAL
  Filled 2023-02-18 (×3): qty 1

## 2023-02-18 MED ORDER — NITROGLYCERIN 0.4 MG SL SUBL: 0.4000 mg | SUBLINGUAL_TABLET | SUBLINGUAL | Status: DC | PRN

## 2023-02-18 MED ORDER — LINACLOTIDE 145 MCG PO CAPS
145.0000 ug | ORAL_CAPSULE | Freq: Every day | ORAL | Status: DC
  Administered 2023-02-19 – 2023-02-21 (×3): 145 ug via ORAL
  Filled 2023-02-18 (×3): qty 1

## 2023-02-18 MED ORDER — LEVOTHYROXINE SODIUM 75 MCG PO TABS
75.0000 ug | ORAL_TABLET | Freq: Every day | ORAL | Status: DC
  Administered 2023-02-19: 75 ug via ORAL
  Filled 2023-02-18: qty 1

## 2023-02-18 MED ORDER — GABAPENTIN 300 MG PO CAPS
300.0000 mg | ORAL_CAPSULE | Freq: Three times a day (TID) | ORAL | Status: DC | PRN
  Administered 2023-02-20: 300 mg via ORAL
  Filled 2023-02-18: qty 1

## 2023-02-18 MED ORDER — VITAMIN B-12 1000 MCG PO TABS
1000.0000 ug | ORAL_TABLET | Freq: Every day | ORAL | Status: DC
  Administered 2023-02-19 – 2023-02-21 (×3): 1000 ug via ORAL
  Filled 2023-02-18 (×3): qty 1

## 2023-02-18 MED ORDER — RANOLAZINE ER 500 MG PO TB12
1000.0000 mg | ORAL_TABLET | Freq: Two times a day (BID) | ORAL | Status: DC
  Administered 2023-02-18 – 2023-02-21 (×6): 1000 mg via ORAL
  Filled 2023-02-18 (×7): qty 2

## 2023-02-18 MED ORDER — PANTOPRAZOLE SODIUM 40 MG PO TBEC
40.0000 mg | DELAYED_RELEASE_TABLET | Freq: Two times a day (BID) | ORAL | Status: DC
  Administered 2023-02-18 – 2023-02-21 (×6): 40 mg via ORAL
  Filled 2023-02-18 (×7): qty 1

## 2023-02-18 MED ORDER — CLOPIDOGREL BISULFATE 75 MG PO TABS
75.0000 mg | ORAL_TABLET | Freq: Every day | ORAL | Status: DC
  Administered 2023-02-19 – 2023-02-21 (×3): 75 mg via ORAL
  Filled 2023-02-18 (×3): qty 1

## 2023-02-18 MED ORDER — ROPINIROLE HCL 1 MG PO TABS
4.0000 mg | ORAL_TABLET | Freq: Every day | ORAL | Status: DC
  Administered 2023-02-18 – 2023-02-20 (×3): 4 mg via ORAL
  Filled 2023-02-18 (×4): qty 4

## 2023-02-18 MED ORDER — BUPROPION HCL ER (XL) 300 MG PO TB24
300.0000 mg | ORAL_TABLET | Freq: Every day | ORAL | Status: DC
  Administered 2023-02-19 – 2023-02-21 (×3): 300 mg via ORAL
  Filled 2023-02-18 (×3): qty 1

## 2023-02-18 MED ORDER — FUROSEMIDE 10 MG/ML IJ SOLN
80.0000 mg | Freq: Two times a day (BID) | INTRAMUSCULAR | Status: DC
  Administered 2023-02-19: 80 mg via INTRAVENOUS
  Filled 2023-02-18: qty 8

## 2023-02-18 MED ORDER — ACETAMINOPHEN 325 MG PO TABS
650.0000 mg | ORAL_TABLET | Freq: Four times a day (QID) | ORAL | Status: DC | PRN
  Administered 2023-02-19 – 2023-02-21 (×4): 650 mg via ORAL
  Filled 2023-02-18 (×5): qty 2

## 2023-02-18 NOTE — ED Provider Notes (Signed)
EMERGENCY DEPARTMENT AT [facility] Provider Note   CSN: [id number] Arrival date & time: 02/18/23  1920     History  Chief Complaint  Patient presents with   Leg Swelling    Meghan Terry is a 62 y.o. female.  HPI    Pt comes in w/ cc of leg swelling. Pt has medical hx significant for diastolic CHF, CKD stage IV, hypertension, CABG, diabetes mellitus, hypertension and depression.   Patient was admitted to the hospital in April for volume overload.  She states that she was discharged with torsemide instead of furosemide.  She has been taking as prescribed.  Over the last week or so she has been noticing increasing leg swelling.  She called her cardiologist and doubled up on torsemide, but despite doing that her symptoms have worsened.  Her dry weight is 135, today she weighed at 146.8 pounds.  She also indicates worsening shortness of breath with exertion compared to her baseline.   Home Medications Prior to Admission medications   Medication Sig Start Date End Date Taking? Authorizing Provider  acetaminophen (TYLENOL) 500 MG tablet Take 2 tablets by mouth 3 times daily as needed for pain 12/08/21  Yes   aspirin EC 81 MG tablet Take 1 tablet (81 mg total) by mouth daily. 09/05/19  Yes Revankar, Aundra Dubin, MD  buPROPion (WELLBUTRIN XL) 300 MG 24 hr tablet Take 1 tablet (300 mg total) by mouth daily. 07/06/22  Yes   cholecalciferol 25 MCG (1000 UT) tablet Take 1 tablet (1,000 Units total) by mouth in the morning and at bedtime. 12/25/22  Yes Johnson, Clanford L, MD  clopidogrel (PLAVIX) 75 MG tablet TAKE 1 TABLET BY MOUTH DAILY Patient taking differently: Take 75 mg by mouth daily. 03/30/22  Yes Revankar, Aundra Dubin, MD  cyanocobalamin (VITAMIN B12) 1000 MCG tablet Take 1 tablet (1,000 mcg total) by mouth daily. 12/25/22  Yes Johnson, Clanford L, MD  dapagliflozin propanediol (FARXIGA) 10 MG TABS tablet Take 1 tablet (10 mg total) by mouth daily before breakfast. 12/26/22  Yes  Johnson, Clanford L, MD  ferrous sulfate 325 (65 FE) MG tablet Take 1 tablet (325 mg total) by mouth 2 (two) times daily with a meal. 12/25/22  Yes Johnson, Clanford L, MD  folic acid (FOLVITE) 1 MG tablet Take 1 tablet (1 mg total) by mouth daily. 12/26/22  Yes Johnson, Clanford L, MD  gabapentin (NEURONTIN) 100 MG capsule Take 3 capsules (300 mg total) by mouth 3 (three) times daily as needed. 02/02/23  Yes Levert Feinstein, MD  levothyroxine (SYNTHROID) 50 MCG tablet TAKE 1 TABLET BY MOUTH ONCE DAILY ON AN EMPTY STOMACH 30 MINUTES BEFORE BREAKFAST ON SAT AND SUNDAY Patient taking differently: Take 50 mcg by mouth See admin instructions. Take 1 tablet in the morning with breakfast on SATURDAY AND SUNDAY. 05/28/22  Yes   levothyroxine (SYNTHROID) 75 MCG tablet Take 1 tablet (75 mcg total) by mouth daily Monday thru Friday. 06/08/22  Yes   linaclotide (LINZESS) 145 MCG CAPS capsule Take 1 capsule (145 mcg total) by mouth daily before breakfast. 01/21/23  Yes Mahon, Frederik Schmidt, NP  metoprolol succinate (TOPROL-XL) 25 MG 24 hr tablet Take 0.5 tablets (12.5 mg total) by mouth at bedtime. Take with or immediately following a meal. 12/25/22  Yes Johnson, Clanford L, MD  nitroGLYCERIN (NITROSTAT) 0.4 MG SL tablet Place 1 tablet (0.4 mg total) under the tongue every 5 (five) minutes as needed for chest pain. 12/29/21  Yes Revankar,  Aundra Dubin, MD  ondansetron (ZOFRAN) 4 MG tablet take 1 tablet (4 mg) by mouth 2 times per day as needed for nausea Patient taking differently: Take 4 mg by mouth 2 (two) times daily as needed for nausea or vomiting. 05/12/22  Yes   pantoprazole (PROTONIX) 40 MG tablet Take 1 tablet (40 mg total) by mouth 2 (two) times daily. 12/25/22  Yes Johnson, Clanford L, MD  Polyvinyl Alcohol-Povidone (REFRESH OP) Apply 1 drop to eye 4 (four) times daily.   Yes [provider]  potassium chloride SA (KLOR-CON M) 20 MEQ tablet Take 2 tablets (40 mEq total) by mouth daily. Patient taking differently: Take  40 mEq by mouth 2 (two) times daily. 02/11/23  Yes Mallipeddi, Vishnu P, MD  promethazine (PHENERGAN) 25 MG tablet Take 1 tablet (25 mg total) by mouth every 6 (six) hours as needed for nausea or vomiting. 12/25/22  Yes Johnson, Clanford L, MD  ranolazine (RANEXA) 1000 MG SR tablet Take 1 tablet (1,000 mg total) by mouth 2 (two) times daily. 02/04/23  Yes Mallipeddi, Vishnu P, MD  rOPINIRole (REQUIP) 4 MG tablet Take 1 tablet (4 mg total) by mouth 1 to 3 hours before bedtime. 06/29/22  Yes   rosuvastatin (CRESTOR) 40 MG tablet Take 1 tablet (40 mg total) by mouth at bedtime. 12/25/22  Yes Johnson, Clanford L, MD  topiramate (TOPAMAX) 100 MG tablet TAKE 1 TABLET BY MOUTH TWICE DAILY. 05/12/22  Yes   torsemide (DEMADEX) 20 MG tablet Take 2 tablets (40 mg total) by mouth 2 (two) times daily. 02/11/23 05/12/23 Yes Mallipeddi, Vishnu P, MD  traMADol (ULTRAM) 50 MG tablet Take 1 tablet (50 mg total) by mouth every 6 (six) hours. Patient taking differently: Take 50 mg by mouth daily as needed for moderate pain. 11/03/22  Yes   traZODone (DESYREL) 150 MG tablet Take 1 tablet (150 mg total) by mouth in the morning and at bedtime. 11/16/22  Yes   traZODone (DESYREL) 50 MG tablet Take 1 tablet (50 mg total) by mouth daily. 11/03/22  Yes       Allergies    Abilify [aripiprazole], Ciprofloxacin, Carbidopa-levodopa, Prednisone, and Venlafaxine    Review of Systems   Review of Systems  All other systems reviewed and are negative.   Physical Exam Updated Vital Signs BP (!) 122/54 (BP Location: Right Arm)   Pulse 99   Temp 98.4 F (36.9 C) (Oral)   Resp 20   Ht 5\' 3"  (1.6 m)   Wt 65.8 kg   SpO2 100%   BMI 25.69 kg/m  Physical Exam Vitals and nursing note reviewed.  Constitutional:      Appearance: She is well-developed.  HENT:     Head: Normocephalic and atraumatic.  Eyes:     Extraocular Movements: Extraocular movements intact.  Cardiovascular:     Rate and Rhythm: Normal rate.  Pulmonary:     Effort:  Pulmonary effort is normal.  Musculoskeletal:        General: Swelling present.     Cervical back: Normal range of motion and neck supple.     Right lower leg: Edema present.     Left lower leg: Edema present.     Comments: 2+ pitting edema, bilateral lower extremity  Skin:    General: Skin is dry.  Neurological:     Mental Status: She is alert and oriented to person, place, and time.     ED Results / Procedures / Treatments   Labs (all labs ordered  are listed, but only abnormal results are displayed) Labs Reviewed  BASIC METABOLIC PANEL - Abnormal; Notable for the following components:      Result Value   Glucose, Bld 153 (*)    BUN 28 (*)    Creatinine, Ser 2.28 (*)    Calcium 8.6 (*)    GFR, Estimated 24 (*)    All other components within normal limits  CBC - Abnormal; Notable for the following components:   RBC 3.03 (*)    Hemoglobin 9.0 (*)    HCT 29.4 (*)    RDW 18.9 (*)    All other components within normal limits  BRAIN NATRIURETIC PEPTIDE    EKG None  Radiology DG Chest 2 View  Result Date: 02/18/2023 CLINICAL DATA:  Shortness of breath, bilateral leg swelling. EXAM: CHEST - 2 VIEW COMPARISON:  01/25/2023. FINDINGS: The heart size and mediastinal contours are within normal limits. There is atherosclerotic calcification of the aorta. Interstitial prominence is noted bilaterally and mild atelectasis or scarring is noted at the costophrenic angles bilaterally, unchanged from prior exams. No effusion or pneumothorax. Sternotomy wires are noted. Surgical clips are present in the right upper quadrant. IMPRESSION: No active cardiopulmonary disease. Electronically Signed   By: Thornell Sartorius M.D.   On: 02/18/2023 20:08    Procedures Procedures    Medications Ordered in ED Medications  furosemide (LASIX) injection 80 mg (has no administration in time range)    ED Course/ Medical Decision Making/ A&P                             Medical Decision Making Amount  and/or Complexity of Data Reviewed Labs: ordered. Radiology: ordered.  Risk Prescription drug management. Decision regarding hospitalization.   This patient presents to the ED with chief complaint(s) of worsening leg swelling, shortness of breath with pertinent past medical history of diastolic CHF, recent admission for volume overload and CKD.The complaint involves an extensive differential diagnosis and also carries with it a high risk of complications and morbidity.    The differential diagnosis includes : Acute CHF exacerbation, lymphedema, venous insufficiency, worsening renal failure, low albumin state, pleural effusion, pulmonary edema, DVT, PE.  Patient had a DVT workup last time she was in the hospital, it was negative. Low clinical suspicion for pulmonary embolism.  Her CMP showed albumin in the mid 3 range, which is reassuring.  The initial plan is to get basic labs, BNP and give patient IV 80 mg Lasix. Given that patient has doubled her torsemide and still not responding, and has persistent weight gain we will admit her for optimization.   Additional history obtained: Records reviewed previous admission documents.  Patient was discharged with her dry weight of 135 pounds.  Independent labs interpretation:  The following labs were independently interpreted: CBC, BMP are reassuring.  Patient's creatinine is at baseline.  Her BNP is only 56.  Hemoglobin is 9, which is baseline normal for patient.  Independent visualization and interpretation of imaging: - I independently visualized the following imaging with scope of interpretation limited to determining acute life threatening conditions related to emergency care: X-ray of the chest, which revealed no evidence of pleural effusion, pulmonary edema  Treatment and Reassessment: Patient will receive IV Lasix.  I think we will proceed with admission at this time, as there is lack of clarity on the etiology of her  symptoms. Lymphedema is a distinct possibility in her case.   Final  Clinical Impression(s) / ED Diagnoses Final diagnoses:  Acute congestive heart failure, unspecified heart failure type [facility])    Rx / DC Orders ED Discharge Orders     None         Derwood Kaplan, MD 02/18/23 2152

## 2023-02-18 NOTE — ED Notes (Signed)
Patient transported to CT 

## 2023-02-18 NOTE — H&P (Signed)
TRH H&P   Patient Demographics:    Meghan Terry, is a 62 y.o. female  MRN: [id number]   DOB - 15-Jan-1961  Admit Date - 02/18/2023  Outpatient Primary MD for the patient is Benita Stabile, MD  Referring MD/NP/PA: Dr Rhunette Croft    Patient coming from: home  Chief Complaint  Patient presents with   Leg Swelling      HPI:    Meghan Terry  is a 62 y.o. female,  history of diastolic CHF, coronary disease status post CABG times 11/2018 with subsequent PCI for NSTEMI and multiple heart catheterizations, CKD stage IV, anxiety, diabetes mellitus type 2, hypertension, hyperlipidemia, restless leg syndrome, tobacco use in remission  -Patient with recent hospitalization earlier this month secondary to acute on chronic diastolic CHF, where her diuresis has been changed to torsemide 40 mg oral daily, she does report worsening lower extremity edema, for which she was instructed by her cardiologist to increase her torsemide to twice daily, which she has been doing for the last 4 days, but overall she still reports progressive lower extremity edema, and significant weight gain from her dry weight 135, she is currently 145.  She reports her dyspnea at baseline, reports compliance with salt restriction and fluid restriction, as well she does endorse 1 syncopal event yesterday evening, with no trauma. -In ED workup significant for baseline creatinine 2.28, her lower extremity with significant edema +3, hemoglobin at baseline of 9, [facility] hospitalist consulted to admit  Review of systems:   A full 10 point Review of Systems was done, except as stated above, all other Review of Systems were negative.   With Past History of the following :    Past Medical History:  Diagnosis Date   Acute diastolic CHF (congestive heart failure) (HCC) 06/08/2021   Acute pulmonary edema (HCC)    Anemia due to stage 4 chronic  kidney disease (HCC) 07/20/2022   Angina pectoris (HCC) 11/17/2019   Anxiety    Atypical chest pain 12/30/2020   Benign hypertension with CKD (chronic kidney disease) stage IV (HCC) 07/20/2022   CHF (congestive heart failure) (HCC) 06/08/2021   CKD (chronic kidney disease) stage 3b, GFR 30-59 ml/min 12/03/2019   Coronary artery disease    Coronary artery disease involving native coronary artery of native heart with angina pectoris (HCC) 10/03/2019   Depression 04/25/2017   Diabetes mellitus due to underlying condition with unspecified complications (HCC) 09/05/2019   Diabetes mellitus with stage 4 chronic kidney disease GFR 15-29 (HCC) 07/20/2022   Essential hypertension 09/05/2019   Ex-smoker 09/05/2019   Family history of coronary artery disease 09/05/2019   History of depression 04/15/2020   Hx of diabetes mellitus 04/15/2020   Hyperlipidemia with target LDL less than 70 11/24/2019   Hyperphosphatemia 07/20/2022   Hypertension    Hypokalemia 04/30/2022   Hypothyroid    Migraine 04/25/2017   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   Syncope and collapse 04/29/2022   Thyroid disease    UTI (urinary tract infection) 04/30/2022      Past Surgical History:  Procedure Laterality Date   ABDOMINAL HYSTERECTOMY     APPENDECTOMY     BIOPSY  12/23/2022   Procedure: BIOPSY;  Surgeon: Corbin Ade, MD;  [facility]: [facility];  Service: Endoscopy;;   CARDIAC CATHETERIZATION  11/22/2019   CHOLECYSTECTOMY     COLONOSCOPY     COLONOSCOPY WITH PROPOFOL N/A 12/25/2022   Procedure: COLONOSCOPY WITH PROPOFOL;  Surgeon: Dolores Frame, MD;  Location: [facility];  Service:  Gastroenterology;  Laterality: N/A;   CORONARY ARTERY BYPASS GRAFT N/A 09/15/2019   Procedure: CORONARY ARTERY BYPASS GRAFTING (CABG) times three on pump using left internal mammary artery and right and left greater saphenous veins harvested endoscopically;  Surgeon: Alleen Borne, MD;  Location: [facility];  Service: Open Heart Surgery;  Laterality: N/A;   CORONARY STENT INTERVENTION N/A 11/23/2019   Procedure: CORONARY STENT INTERVENTION;  Surgeon: Lennette Bihari, MD;  Location: [facility];  Service: Cardiovascular;  Laterality: N/A;   ESOPHAGEAL DILATION N/A 12/23/2022   Procedure: ESOPHAGEAL DILATION;  Surgeon: Corbin Ade, MD;  Location: [facility];  Service: Endoscopy;  Laterality: N/A;   ESOPHAGOGASTRODUODENOSCOPY (EGD) WITH PROPOFOL N/A 12/23/2022   Procedure: ESOPHAGOGASTRODUODENOSCOPY (EGD) WITH PROPOFOL;  Surgeon: Corbin Ade, MD;  [facility]: [facility];  Service: Endoscopy;  Laterality: N/A;   KNEE ARTHROSCOPY     LEFT HEART CATH AND CORONARY ANGIOGRAPHY N/A 09/14/2019   Procedure: LEFT HEART CATH AND CORONARY ANGIOGRAPHY;  Surgeon: Corky Crafts, MD;  Location: [facility] INVAS[facility] CV LAB;  Service: Cardiovascular;  Laterality: N/A;   LEFT HEART CATH AND CORS/GRAFTS ANGIOGRAPHY N/A 11/22/2019   Procedure: LEFT HEART CATH AND CORS/GRAFTS ANGIOGRAPHY;  Surgeon: Lennette Bihari, MD;  Location: [facility];  Service: Cardiovascular;  Laterality: N/A;   LEFT HEART CATH AND CORS/GRAFTS ANGIOGRAPHY N/A 03/13/2020   Procedure: LEFT HEART CATH AND CORS/GRAFTS ANGIOGRAPHY;  Surgeon: Runell Gess, MD;  Location: [facility];  Service: Cardiovascular;  Laterality: N/A;   LEFT HEART CATH AND CORS/GRAFTS ANGIOGRAPHY N/A 12/31/2020   Procedure: LEFT HEART CATH AND CORS/GRAFTS ANGIOGRAPHY;  Surgeon: Lyn Records, MD;  Location: [facility];  Service: Cardiovascular;  Laterality: N/A;   OSTEOCHONDRAL DEFECT REPAIR/RECONSTRUCTION Left 11/29/2014   Procedure: LEFT  ANKLE MEDIAL MALLEOLUS OSTEOTOMY,AUTO GRAFT FROM CALCANEOUS;  OS TIBIA DENORO GRAFTING TALUS;  Surgeon: Toni Arthurs, MD;  Location: [facility];  Service: Orthopedics;  Laterality: Left;   POLYPECTOMY  12/25/2022   Procedure: POLYPECTOMY;  Surgeon: Dolores Frame, MD;  [facility];  Service: Gastroenterology;;   TEE WITHOUT CARDIOVERSION N/A 09/15/2019   Procedure: TRANSESOPHAGEAL ECHOCARDIOGRAM (TEE);  Surgeon: Alleen Borne, MD;  Location[facility] OR;  Service: Open Heart Surgery;  Laterality: N/A;   TUBAL LIGATION        Social History:     Social History   Tobacco Use   Smoking status: Former    Packs/day: 1.00    Years: 47.00  Additional pack years: 0.00    Total pack years: 47.00    Types: Cigarettes    Quit date: 09/15/2019    Years since quitting: 3.4    Passive exposure: Past   Smokeless tobacco: Never  Substance Use Topics   Alcohol use: No       Family History :     Family History  Problem Relation Age of Onset   Asthma Mother    COPD Mother    Diabetes Mother    Macular degeneration Mother    Congestive Heart Failure Father       Home Medications:   Prior to Admission medications   Medication Sig Start Date End Date Taking? Authorizing Provider  acetaminophen (TYLENOL) 500 MG tablet Take 2 tablets by mouth 3 times daily as needed for pain 12/08/21  Yes   aspirin EC 81 MG tablet Take 1 tablet (81 mg total) by mouth daily. 09/05/19  Yes Revankar, Aundra Dubin, MD  buPROPion (WELLBUTRIN XL) 300 MG 24 hr tablet Take 1 tablet (300 mg total) by mouth daily. 07/06/22  Yes   cholecalciferol 25 MCG (1000 UT) tablet Take 1 tablet (1,000 Units total) by mouth in the morning and at bedtime. 12/25/22  Yes Johnson, Clanford L, MD  clopidogrel (PLAVIX) 75 MG tablet TAKE 1 TABLET BY MOUTH DAILY Patient taking differently: Take 75 mg by mouth daily. 03/30/22  Yes Revankar, Aundra Dubin, MD  cyanocobalamin (VITAMIN B12) 1000 MCG tablet Take 1 tablet  (1,000 mcg total) by mouth daily. 12/25/22  Yes Johnson, Clanford L, MD  dapagliflozin propanediol (FARXIGA) 10 MG TABS tablet Take 1 tablet (10 mg total) by mouth daily before breakfast. 12/26/22  Yes Johnson, Clanford L, MD  ferrous sulfate 325 (65 FE) MG tablet Take 1 tablet (325 mg total) by mouth 2 (two) times daily with a meal. 12/25/22  Yes Johnson, Clanford L, MD  folic acid (FOLVITE) 1 MG tablet Take 1 tablet (1 mg total) by mouth daily. 12/26/22  Yes Johnson, Clanford L, MD  gabapentin (NEURONTIN) 100 MG capsule Take 3 capsules (300 mg total) by mouth 3 (three) times daily as needed. 02/02/23  Yes Levert Feinstein, MD  levothyroxine (SYNTHROID) 50 MCG tablet TAKE 1 TABLET BY MOUTH ONCE DAILY ON AN EMPTY STOMACH 30 MINUTES BEFORE BREAKFAST ON SAT AND SUNDAY Patient taking differently: Take 50 mcg by mouth See admin instructions. Take 1 tablet in the morning with breakfast on SATURDAY AND SUNDAY. 05/28/22  Yes   levothyroxine (SYNTHROID) 75 MCG tablet Take 1 tablet (75 mcg total) by mouth daily Monday thru Friday. 06/08/22  Yes   linaclotide (LINZESS) 145 MCG CAPS capsule Take 1 capsule (145 mcg total) by mouth daily before breakfast. 01/21/23  Yes Mahon, Frederik Schmidt, NP  metoprolol succinate (TOPROL-XL) 25 MG 24 hr tablet Take 0.5 tablets (12.5 mg total) by mouth at bedtime. Take with or immediately following a meal. 12/25/22  Yes Johnson, Clanford L, MD  nitroGLYCERIN (NITROSTAT) 0.4 MG SL tablet Place 1 tablet (0.4 mg total) under the tongue every 5 (five) minutes as needed for chest pain. 12/29/21  Yes Revankar, Aundra Dubin, MD  ondansetron (ZOFRAN) 4 MG tablet take 1 tablet (4 mg) by mouth 2 times per day as needed for nausea Patient taking differently: Take 4 mg by mouth 2 (two) times daily as needed for nausea or vomiting. 05/12/22  Yes   pantoprazole (PROTONIX) 40 MG tablet Take 1 tablet (40 mg total) by mouth 2 (two) times daily. 12/25/22  Yes Johnson, Clanford L, MD  Polyvinyl Alcohol-Povidone (REFRESH OP)  Apply 1 drop to eye 4 (four) times daily.   Yes [provider]  potassium chloride SA (KLOR-CON M) 20 MEQ tablet Take 2 tablets (40 mEq total) by mouth daily. Patient taking differently: Take 40 mEq by mouth 2 (two) times daily. 02/11/23  Yes Mallipeddi, Vishnu P, MD  promethazine (PHENERGAN) 25 MG tablet Take 1 tablet (25 mg total) by mouth every 6 (six) hours as needed for nausea or vomiting. 12/25/22  Yes Johnson, Clanford L, MD  ranolazine (RANEXA) 1000 MG SR tablet Take 1 tablet (1,000 mg total) by mouth 2 (two) times daily. 02/04/23  Yes Mallipeddi, Vishnu P, MD  rOPINIRole (REQUIP) 4 MG tablet Take 1 tablet (4 mg total) by mouth 1 to 3 hours before bedtime. 06/29/22  Yes   rosuvastatin (CRESTOR) 40 MG tablet Take 1 tablet (40 mg total) by mouth at bedtime. 12/25/22  Yes Johnson, Clanford L, MD  topiramate (TOPAMAX) 100 MG tablet TAKE 1 TABLET BY MOUTH TWICE DAILY. 05/12/22  Yes   torsemide (DEMADEX) 20 MG tablet Take 2 tablets (40 mg total) by mouth 2 (two) times daily. 02/11/23 05/12/23 Yes Mallipeddi, Vishnu P, MD  traMADol (ULTRAM) 50 MG tablet Take 1 tablet (50 mg total) by mouth every 6 (six) hours. Patient taking differently: Take 50 mg by mouth daily as needed for moderate pain. 11/03/22  Yes   traZODone (DESYREL) 150 MG tablet Take 1 tablet (150 mg total) by mouth in the morning and at bedtime. 11/16/22  Yes   traZODone (DESYREL) 50 MG tablet Take 1 tablet (50 mg total) by mouth daily. 11/03/22  Yes      Allergies:     Allergies  Allergen Reactions   Abilify [Aripiprazole]    Ciprofloxacin Nausea And Vomiting   Carbidopa-Levodopa Other (See Comments)    Developed tics while taking    Prednisone Other (See Comments)    Turns red all over    Venlafaxine Other (See Comments)    Developed tics while taking - reaction to Effexor      Physical Exam:   Vitals  Blood pressure (!) 122/54, pulse 99, temperature 98.4 F (36.9 C), temperature source Oral, resp. rate 20, height 5'  3" (1.6 m), weight 65.8 kg, SpO2 100 %.   1. General well-developed female, frail, laying in bed, no apparent distress  2. Normal affect and insight, Not Suicidal or Homicidal, Awake Alert, Oriented X 3.  3. No F.N deficits, ALL C.Nerves Intact, Strength 5/5 all 4 extremities, Sensation intact all 4 extremities, Plantars down going.  4. Ears and Eyes appear Normal, Conjunctivae clear, PERRLA. Moist Oral Mucosa.  5. Supple Neck, No JVD, No cervical lymphadenopathy appriciated, No Carotid Bruits.  6. Symmetrical Chest wall movement, Good air movement bilaterally, CTAB.  7. RRR, No Gallops, Rubs or Murmurs, No Parasternal Heave.  +3 edema  8. Positive Bowel Sounds, Abdomen Soft, No tenderness, No organomegaly appriciated,No rebound -guarding or rigidity.  9.  No Cyanosis, Normal Skin Turgor, No Skin Rash or Bruise.  10. Good muscle tone,  joints appear normal , no effusions, Normal ROM.    Data Review:    CBC Recent Labs  Lab 02/18/23 1948  WBC 8.4  HGB 9.0*  HCT 29.4*  PLT 259  MCV 97.0  MCH 29.7  MCHC 30.6  RDW 18.9*   ------------------------------------------------------------------------------------------------------------------  Chemistries  Recent Labs  Lab 02/18/23 1948  NA 135  K 3.8  CL 102  CO2 23  GLUCOSE 153*  BUN 28*  CREATININE 2.28*  CALCIUM 8.6*   ------------------------------------------------------------------------------------------------------------------ estimated creatinine clearance is 23 mL/min (A) (by C-G formula based on SCr of 2.28 mg/dL (H)). ------------------------------------------------------------------------------------------------------------------ No results for input(s): "TSH", "T4TOTAL", "T3FREE", "THYROIDAB" in the last 72 hours.  Invalid input(s): "FREET3"  Coagulation profile No results for input(s): "INR", "PROTIME" in the last 168  hours. ------------------------------------------------------------------------------------------------------------------- No results for input(s): "DDIMER" in the last 72 hours. -------------------------------------------------------------------------------------------------------------------  Cardiac Enzymes No results for input(s): "CKMB", "TROPONINI", "MYOGLOBIN" in the last 168 hours.  Invalid input(s): "CK" ------------------------------------------------------------------------------------------------------------------    Component Value Date/Time   BNP 243.0 (H) 01/25/2023 1522     ---------------------------------------------------------------------------------------------------------------  Urinalysis    Component Value Date/Time   COLORURINE YELLOW 01/25/2023 1410   APPEARANCEUR CLEAR 01/25/2023 1410   LABSPEC 1.006 01/25/2023 1410   PHURINE 7.0 01/25/2023 1410   GLUCOSEU >=500 (A) 01/25/2023 1410   HGBUR NEGATIVE 01/25/2023 1410   BILIRUBINUR NEGATIVE 01/25/2023 1410   KETONESUR NEGATIVE 01/25/2023 1410   PROTEINUR NEGATIVE 01/25/2023 1410   NITRITE NEGATIVE 01/25/2023 1410   LEUKOCYTESUR NEGATIVE 01/25/2023 1410    ----------------------------------------------------------------------------------------------------------------   Imaging Results:    DG Chest 2 View  Result Date: 02/18/2023 CLINICAL DATA:  Shortness of breath, bilateral leg swelling. EXAM: CHEST - 2 VIEW COMPARISON:  01/25/2023. FINDINGS: The heart size and mediastinal contours are within normal limits. There is atherosclerotic calcification of the aorta. Interstitial prominence is noted bilaterally and mild atelectasis or scarring is noted at the costophrenic angles bilaterally, unchanged from prior exams. No effusion or pneumothorax. Sternotomy wires are noted. Surgical clips are present in the right upper quadrant. IMPRESSION: No active cardiopulmonary disease. Electronically Signed   By: Thornell Sartorius M.D.   On: 02/18/2023 20:08    EKG:  Vent. rate 95 BPM PR interval 140 ms QRS duration 84 ms QT/QTcB 338/424 ms P-R-T axes 26 61 54 Normal sinus rhythm Normal ECG When compared with ECG of 25-Jan-2023 15:59, T  Assessment & Plan:    Principal Problem:   Acute on chronic diastolic CHF (congestive heart failure) (HCC) Active Problems:   Hypothyroid   Coronary artery disease involving native coronary artery of native heart with angina pectoris (HCC)   Hyperlipidemia with target LDL less than 70   Anemia due to stage 4 chronic kidney disease (HCC)   Benign hypertension with CKD (chronic kidney disease) stage IV (HCC)   Diabetes mellitus with stage 4 chronic kidney disease GFR 15-29 (HCC)   Acute on chronic diastolic (congestive) heart failure (HCC) -3+ pitting bilateral lower extremity edema, 10 pounds fluid retention. -Will be admitted under CHF pathway, continue with IV Lasix 80 mg IV twice daily, daily weight, strict ins and outs, low-salt diet - Recent echo 11/2022 EF of 60 to 65%, G2DD. -Strict input output, daily weights, daily BMP   Hypertension -  continue with home medications   CKD stage IV -Monitor closely as on IV diuresis  Syncope -will check orthostatics this, will monitor on telemetry, will check CT head  Anemia due to CKD stage IV -Hemoglobin stable at baseline, continue with B12 and iron  History of CAD status post CABG x 3 -She denies any chest pain, continue with ranolazine, aspirin, Plavix, metoprolol and Crestor  Diabetes mellitus, type II -Continue with Marcelline Deist, will keep on insulin sliding scale, will check A1c  Hyperlipidemia -Continue with statin  History of depression -continue with Wellbutrin     DVT Prophylaxis Heparin  AM Labs Ordered,  also please review Full Orders  Family Communication: Admission, patients condition and plan of care including tests being ordered have been discussed with the patient  who indicate  understanding and agree with the plan and Code Status.  Code Status DNR, confirmed by patient  Likely DC to home  Condition GUARDED  Consults called: None  Admission status: Inpatient  Time spent in minutes : 70 minutes   Huey Bienenstock M.D on 02/18/2023 at 9:12 PM   [facility] - Office  [phone]

## 2023-02-18 NOTE — Plan of Care (Signed)
  Problem: Education: Goal: Knowledge of General Education information will improve Description: Including pain rating scale, medication(s)/side effects and non-pharmacologic comfort measures Outcome: Progressing   Problem: Health Behavior/Discharge Planning: Goal: Ability to manage health-related needs will improve Outcome: Progressing   Problem: Clinical Measurements: Goal: Will remain free from infection Outcome: Progressing   Problem: Nutrition: Goal: Adequate nutrition will be maintained Outcome: Progressing   Problem: Coping: Goal: Level of anxiety will decrease Outcome: Progressing   Problem: Clinical Measurements: Goal: Respiratory complications will improve Outcome: Not Progressing Goal: Cardiovascular complication will be avoided Outcome: Not Progressing   Problem: Activity: Goal: Risk for activity intolerance will decrease Outcome: Not Progressing

## 2023-02-18 NOTE — ED Notes (Signed)
Pt up amb to [facility]  No distress

## 2023-02-18 NOTE — ED Triage Notes (Signed)
Pt via POV c/o bilateral leg swelling and 10 lb weight gain in less than a month. PCP increased her lasix prescription recently but swelling has not improved. Hx CHF and stage IV renal disease not yet on dialysis. Pt is also having SOB and dyspnea on exertion. Leg pain rated 8/10 and itchy due to swelling.

## 2023-02-19 DIAGNOSIS — I129 Hypertensive chronic kidney disease with stage 1 through stage 4 chronic kidney disease, or unspecified chronic kidney disease: Secondary | ICD-10-CM

## 2023-02-19 DIAGNOSIS — E039 Hypothyroidism, unspecified: Secondary | ICD-10-CM

## 2023-02-19 DIAGNOSIS — I5033 Acute on chronic diastolic (congestive) heart failure: Secondary | ICD-10-CM | POA: Diagnosis not present

## 2023-02-19 DIAGNOSIS — E1122 Type 2 diabetes mellitus with diabetic chronic kidney disease: Secondary | ICD-10-CM

## 2023-02-19 DIAGNOSIS — N184 Chronic kidney disease, stage 4 (severe): Secondary | ICD-10-CM | POA: Diagnosis not present

## 2023-02-19 DIAGNOSIS — E785 Hyperlipidemia, unspecified: Secondary | ICD-10-CM

## 2023-02-19 DIAGNOSIS — I25119 Atherosclerotic heart disease of native coronary artery with unspecified angina pectoris: Secondary | ICD-10-CM | POA: Diagnosis not present

## 2023-02-19 LAB — CBC
HCT: 28.5 % — ABNORMAL LOW (ref 36.0–46.0)
Hematocrit: 27.8 % — ABNORMAL LOW (ref 34.0–46.6)
Hemoglobin: 8.8 g/dL — ABNORMAL LOW (ref 11.1–15.9)
Hemoglobin: 8.6 g/dL — ABNORMAL LOW (ref 12.0–15.0)
MCH: 28.9 pg (ref 26.6–33.0)
MCH: 29.3 pg (ref 26.0–34.0)
MCHC: 31.7 g/dL (ref 31.5–35.7)
MCHC: 30.2 g/dL (ref 30.0–36.0)
MCV: 91 fL (ref 79–97)
MCV: 96.9 fL (ref 80.0–100.0)
Platelets: 285 10*3/uL (ref 150–450)
Platelets: 242 10*3/uL (ref 150–400)
RBC: 3.05 x10E6/uL — ABNORMAL LOW (ref 3.77–5.28)
RBC: 2.94 MIL/uL — ABNORMAL LOW (ref 3.87–5.11)
RDW: 17.2 % — ABNORMAL HIGH (ref 11.7–15.4)
RDW: 18.7 % — ABNORMAL HIGH (ref 11.5–15.5)
WBC: 7.4 10*3/uL (ref 3.4–10.8)
WBC: 5.9 10*3/uL (ref 4.0–10.5)
nRBC: 0 % (ref 0.0–0.2)

## 2023-02-19 LAB — BASIC METABOLIC PANEL
Anion gap: 10 (ref 5–15)
BUN: 28 mg/dL — ABNORMAL HIGH (ref 8–23)
CO2: 23 mmol/L (ref 22–32)
Calcium: 8.7 mg/dL — ABNORMAL LOW (ref 8.9–10.3)
Chloride: 105 mmol/L (ref 98–111)
Creatinine, Ser: 2.44 mg/dL — ABNORMAL HIGH (ref 0.44–1.00)
GFR, Estimated: 22 mL/min — ABNORMAL LOW (ref >60)
Glucose, Bld: 105 mg/dL — ABNORMAL HIGH (ref 70–99)
Potassium: 3.5 mmol/L (ref 3.5–5.1)
Sodium: 138 mmol/L (ref 135–145)

## 2023-02-19 LAB — GLUCOSE, CAPILLARY
Glucose-Capillary: 94 mg/dL (ref 70–99)
Glucose-Capillary: 139 mg/dL — ABNORMAL HIGH (ref 70–99)
Glucose-Capillary: 124 mg/dL — ABNORMAL HIGH (ref 70–99)

## 2023-02-19 LAB — IRON,TIBC AND FERRITIN PANEL
Ferritin: 25 ng/mL (ref 15–150)
Iron Saturation: 22 % (ref 15–55)
Iron: 69 ug/dL (ref 27–139)
Total Iron Binding Capacity: 313 ug/dL (ref 250–450)
UIBC: 244 ug/dL (ref 118–369)

## 2023-02-19 LAB — B12 AND FOLATE PANEL
Folate: >20 ng/mL (ref >3.0)
Vitamin B-12: 715 pg/mL (ref 232–1245)

## 2023-02-19 MED ORDER — FERROUS SULFATE 325 (65 FE) MG PO TABS
325.0000 mg | ORAL_TABLET | Freq: Every day | ORAL | Status: DC
  Administered 2023-02-19 – 2023-02-21 (×3): 325 mg via ORAL
  Filled 2023-02-19 (×3): qty 1

## 2023-02-19 MED ORDER — LEVOTHYROXINE SODIUM 50 MCG PO TABS
50.0000 ug | ORAL_TABLET | ORAL | Status: DC
  Administered 2023-02-20 – 2023-02-21 (×2): 50 ug via ORAL
  Filled 2023-02-19 (×2): qty 1

## 2023-02-19 MED ORDER — DAPAGLIFLOZIN PROPANEDIOL 10 MG PO TABS
10.0000 mg | ORAL_TABLET | Freq: Every day | ORAL | Status: DC
  Administered 2023-02-20 – 2023-02-21 (×2): 10 mg via ORAL
  Filled 2023-02-19 (×2): qty 1

## 2023-02-19 MED ORDER — FUROSEMIDE 10 MG/ML IJ SOLN
60.0000 mg | Freq: Two times a day (BID) | INTRAMUSCULAR | Status: DC
  Administered 2023-02-19 – 2023-02-20 (×2): 60 mg via INTRAVENOUS
  Filled 2023-02-19 (×2): qty 6

## 2023-02-19 NOTE — Evaluation (Signed)
Physical Therapy Evaluation Patient Details Name: Meghan Terry MRN: [id number] DOB: [birth year] Today's Date: 02/19/2023  History of Present Illness  Meghan Terry  is a 62 y.o. female,  history of diastolic CHF, coronary disease status post CABG times 11/2018 with subsequent PCI for NSTEMI and multiple heart catheterizations, CKD stage IV, anxiety, diabetes mellitus type 2, hypertension, hyperlipidemia, restless leg syndrome, tobacco use in remission   -Patient with recent hospitalization earlier this month secondary to acute on chronic diastolic CHF, where her diuresis has been changed to torsemide 40 mg oral daily, she does report worsening lower extremity edema, for which she was instructed by her cardiologist to increase her torsemide to twice daily, which she has been doing for the last 4 days, but overall she still reports progressive lower extremity edema, and significant weight gain from her dry weight 135, she is currently 145.  She reports her dyspnea at baseline, reports compliance with salt restriction and fluid restriction, as well she does endorse 1 syncopal event yesterday evening, with no trauma.   Clinical Impression  Patient functioning near baseline for functional mobility and gait demonstrating good return for ambulating in room, hallway without loss of balance or need for an AD.  Patient encouraged to ambulate ad lib in room/hallway as tolerated for length of stay.  Plan:  Patient discharged from physical therapy to care of nursing for ambulation daily as tolerated for length of stay.         Recommendations for follow up therapy are one component of a multi-disciplinary discharge planning process, led by the attending physician.  Recommendations may be updated based on patient status, additional functional criteria and insurance authorization.  Follow Up Recommendations       Assistance Recommended at Discharge    Patient can return home with the following  Other (comment) (near  baseline)    Equipment Recommendations None recommended by PT  Recommendations for Other Services       Functional Status Assessment Patient has not had a recent decline in their functional status     Precautions / Restrictions Precautions Precautions: None Restrictions Weight Bearing Restrictions: No      Mobility  Bed Mobility Overal bed mobility: Independent                  Transfers Overall transfer level: Independent                      Ambulation/Gait Ambulation/Gait assistance: Modified independent (Device/Increase time) Gait Distance (Feet): 100 Feet Assistive device: None Gait Pattern/deviations: WFL(Within Functional Limits) Gait velocity: near normal     General Gait Details: grossly WFL with good return demonstrated for ambulation in room, hallway without loss of balance  Stairs            Wheelchair Mobility    Modified Rankin (Stroke Patients Only)       Balance Overall balance assessment: No apparent balance deficits (not formally assessed)                                           Pertinent Vitals/Pain Pain Assessment Pain Assessment: No/denies pain    Home Living Family/patient expects to be discharged to:: Private residence Living Arrangements: Children Available Help at Discharge: Family;Available PRN/intermittently Type of Home: Apartment Home Access: Level entry       Home Layout: One level Home Equipment:  None      Prior Function Prior Level of Function : Independent/Modified Independent;History of Falls (last six months)             Mobility Comments: Tourist information centre manager, drives, 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 ADLs Comments: Independent     Hand Dominance        Extremity/Trunk Assessment   Upper Extremity Assessment Upper Extremity Assessment: Overall WFL for tasks assessed    Lower Extremity Assessment Lower Extremity Assessment: Overall WFL for tasks  assessed    Cervical / Trunk Assessment Cervical / Trunk Assessment: Normal  Communication   Communication: No difficulties  Cognition Arousal/Alertness: Awake/alert Behavior During Therapy: WFL for tasks assessed/performed Overall Cognitive Status: Within Functional Limits for tasks assessed                                          General Comments      Exercises     Assessment/Plan    PT Assessment Patient does not need any further PT services  PT Problem List         PT Treatment Interventions      PT Goals (Current goals can be found in the Care Plan section)  Acute Rehab PT Goals Patient Stated Goal: return home with family to assist PT Goal Formulation: With patient Time For Goal Achievement: 02/19/23 Potential to Achieve Goals: Good    Frequency       Co-evaluation               AM-PAC PT "6 Clicks" Mobility  Outcome Measure Help needed turning from your back to your side while in a flat bed without using bedrails?: None Help needed moving from lying on your back to sitting on the side of a flat bed without using bedrails?: None Help needed moving to and from a bed to a chair (including a wheelchair)?: None Help needed standing up from a chair using your arms (e.g., wheelchair or bedside chair)?: None Help needed to walk in hospital room?: None Help needed climbing 3-5 steps with a railing? : None 6 Click Score: 24    End of Session   Activity Tolerance: Patient tolerated treatment well Patient left: in bed;with call bell/phone within reach Nurse Communication: Mobility status PT Visit Diagnosis: Unsteadiness on feet (R26.81);Other abnormalities of gait and mobility (R26.89);Muscle weakness (generalized) (M62.81)    Time: 1610-9604 PT Time Calculation (min) (ACUTE ONLY): 10 min   Charges:   PT Evaluation $PT Eval Low Complexity: 1 Low PT Treatments $Therapeutic Activity: 8-22 mins        2:36 PM, 02/19/23 Ocie Bob, MPT Physical Therapist with [facility] [address] [phone] office [phone] mobile phone

## 2023-02-19 NOTE — Care Management Important Message (Signed)
Important Message  Patient Details  Name: Meghan Terry MRN: [id number] Date of Birth: 22-Dec-1960   Medicare Important Message Given:  N/A - LOS <3 / Initial given by admissions     Corey Harold 02/19/2023, 10:48 AM

## 2023-02-19 NOTE — TOC Initial Note (Signed)
Transition of Care [facility]) - Initial/Assessment Note    Patient Details  Name: Meghan Terry MRN: [id number] Date of Birth: [birth year]  Transition of Care [facility]) CM/SW Contact:    Leitha Bleak, RN Phone Number: 02/19/2023, 3:47 PM  Clinical Narrative:     Patient admitted with CHF. TOC consulted. Patient has a high risk of readmission. Patient lives with her spouse. She drives as needed. Independent with ADLS's, no equipment needed. She does not weigh herself daily. She is interested in CHF education and home health.  [facility] accepted the referral. MD aware to order. Added to AVS with other CHF Education.              Expected Discharge Plan: Home w Home Health Services Barriers to Discharge: Continued Medical Work up   Patient Goals and CMS Choice Patient states their goals for this hospitalization and ongoing recovery are:: to go home. CMS Medicare.gov Compare Post Acute Care list provided to:: Patient Choice offered to / list presented to : Patient      Expected Discharge Plan and Services       Living arrangements for the past 2 months: Apartment                      HH Arranged: RN HH Agency: [facility] Date [facility] Agency Contacted: 02/19/23 Time HH Agency Contacted: [phone] Representative spoke with at [facility] Agency: [facility] Custard  Prior Living Arrangements/Services Living arrangements for the past 2 months: Apartment Lives with:: Spouse Patient language and need for interpreter reviewed:: Yes Do you feel safe going back to the place where you live?: Yes      Need for Family Participation in Patient Care: Yes (Comment) Care giver support system in place?: Yes (comment)   Criminal Activity/Legal Involvement Pertinent to Current Situation/Hospitalization: No - Comment as needed  Activities of Daily Living Home Assistive Devices/Equipment: Blood pressure cuff, CBG Meter, Dentures (specify type), Eyeglasses, Scales ADL Screening (condition at time of admission) Patient's  cognitive ability adequate to safely complete daily activities?: Yes Is the patient deaf or have difficulty hearing?: No Does the patient have difficulty seeing, even when wearing glasses/contacts?: No Does the patient have difficulty concentrating, remembering, or making decisions?: No Patient able to express need for assistance with ADLs?: Yes Does the patient have difficulty dressing or bathing?: Yes Independently performs ADLs?: Yes (appropriate for developmental age) Does the patient have difficulty walking or climbing stairs?: Yes Weakness of Legs: Both Weakness of Arms/Hands: None  Permission Sought/Granted   Emotional Assessment   Attitude/Demeanor/Rapport: Engaged Affect (typically observed): Accepting Orientation: : Oriented to Self, Oriented to Place, Oriented to  Time, Oriented to Situation Alcohol / Substance Use: Not Applicable Psych Involvement: No (comment)  Admission diagnosis:  Acute on chronic diastolic CHF (congestive heart failure) (HCC) [I50.33] Acute congestive heart failure, unspecified heart failure type [facility]) [I50.9] Patient Active Problem List   Diagnosis Date Noted   Acute on chronic diastolic CHF (congestive heart failure) (HCC) 02/18/2023   Confusion 02/02/2023   Cognitive impairment 02/02/2023   History of traumatic head injury 02/02/2023   Acute on chronic diastolic (congestive) heart failure (HCC) 01/25/2023   IDA (iron deficiency anemia) 12/23/2022   Symptomatic anemia 12/21/2022   Diabetic nephropathy associated with type 2 diabetes mellitus (HCC) 08/10/2022   Anemia due to stage 4 chronic kidney disease (HCC) 07/20/2022   Hyperphosphatemia 07/20/2022   Prerenal azotemia 07/20/2022   Benign hypertension with CKD (chronic kidney disease) stage IV (HCC) 07/20/2022  Diabetes mellitus with stage 4 chronic kidney disease GFR 15-29 (HCC) 07/20/2022   CKD (chronic kidney disease) stage 4, GFR 15-29 ml/min (HCC) 05/01/2022   UTI (urinary tract  infection) 04/30/2022   Hypokalemia 04/30/2022   AKI on CKD IV 04/30/2022   Syncope and collapse 04/29/2022   Acute diastolic CHF (congestive heart failure) (HCC) 06/08/2021   HFpEF 06/08/2021   Acute pulmonary edema (HCC)    RLS (restless legs syndrome)    Anxiety    Coronary artery disease    Hypertension    Thyroid disease    Atypical chest pain 12/30/2020   Restless legs syndrome 04/15/2020   History of depression 04/15/2020   Hx of diabetes mellitus 04/15/2020   CKD (chronic kidney disease) stage 3b, GFR 30-59 ml/min 12/03/2019   Hyperlipidemia with target LDL less than 70 11/24/2019   Presence of drug coated stent in right coronary artery 11/24/2019   Angina pectoris (HCC) 11/17/2019   Coronary artery disease involving native coronary artery of native heart with angina pectoris (HCC) 10/03/2019   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   Essential hypertension 09/05/2019   Diabetes mellitus due to underlying condition with unspecified complications (HCC) 09/05/2019   Ex-smoker 09/05/2019   Family history of coronary artery disease 09/05/2019   Migraine 04/25/2017   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   Hypothyroid 04/25/2017   Depression 04/25/2017   Pyelonephritis 05/20/2016   PCP:  Benita Stabile, MD Pharmacy:   [facility] - [place], [place] - [address] [place] [address] Phone: [phone] Fax: [phone]  [facility] - [name], [place] - [address] [place] [address] Phone: [phone] Fax: [phone]     Social Determinants of Health (SDOH) Social History: SDOH Screenings   Food Insecurity: No Food Insecurity (01/25/2023)  Housing: Low Risk  (01/25/2023)  Transportation Needs: No Transportation Needs (01/25/2023)  Utilities: Not At Risk (01/25/2023)  Alcohol Screen: Low Risk  (06/10/2021)  Financial Resource Strain: High Risk (06/19/2021)  Tobacco Use: Medium Risk (02/18/2023)    SDOH Interventions:     Readmission Risk Interventions     No data to display

## 2023-02-19 NOTE — Progress Notes (Addendum)
Pt alert and oriented and asking for bed alarm to be off multiple times.  Pt was educated on falls and reminded that she has had several falls at home during the last year. Pt provided with non-slip socks and bed alarm turned off per her request.  Meghan Terry

## 2023-02-19 NOTE — Progress Notes (Signed)
PROGRESS NOTE   Meghan Terry  ZOX:[id number] DOB: 08-31-1961 DOA: 02/18/2023 PCP: Benita Stabile, MD   Chief Complaint  Patient presents with   Leg Swelling   Level of care: Telemetry  Brief Admission History:  62 y.o. female,  history of diastolic CHF, coronary disease status post CABG times 11/2018 with subsequent PCI for NSTEMI and multiple heart catheterizations, CKD stage IV, anxiety, diabetes mellitus type 2, hypertension, hyperlipidemia, restless leg syndrome, tobacco use in remission  -Patient with recent hospitalization earlier this month secondary to acute on chronic diastolic CHF, where her diuresis has been changed to torsemide 40 mg oral daily, she does report worsening lower extremity edema, for which she was instructed by her cardiologist to increase her torsemide to twice daily, which she has been doing for the last 4 days, but overall she still reports progressive lower extremity edema, and significant weight gain from her dry weight 135, she is currently 145.  She reports her dyspnea at baseline, reports compliance with salt restriction and fluid restriction, as well she does endorse 1 syncopal event yesterday evening, with no trauma. -In ED workup significant for baseline creatinine 2.28, her lower extremity with significant edema +3, hemoglobin at baseline of 9, [facility] hospitalist consulted to admit   Assessment and Plan:   Acute on chronic diastolic (congestive) heart failure -3+ pitting bilateral lower extremity edema, 10 pounds fluid retention. -Will be admitted under CHF pathway, continue with IV Lasix 80 mg IV twice daily, daily weight, strict ins and outs, low-salt diet - Recent echo 11/2022 EF of 60 to 65%, G2DD. 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 -Hemoglobin stable at baseline, continue with B12 and iron   History of CAD status post CABG x 3 -She denies any chest pain, continue with ranolazine, aspirin, Plavix, metoprolol and Crestor   Diabetes mellitus, type II -Continue with Marcelline Deist, will keep on insulin sliding scale, will check A1c  CBG (last 3)  Recent Labs    02/18/23 2152 02/19/23 0746  GLUCAP 102* 94    Hyperlipidemia -Continue with statin   History of depression -continue with Wellbutrin    DVT prophylaxis: [place] heparin Code Status: DNR  Family Communication:  Disposition: Status is: Inpatient Remains inpatient appropriate because: IV furosemide diuresis    Consultants:   Procedures:   Antimicrobials:    Subjective: Pt reports she is down 3 pounds already.  She is reporting good response from IV furosemide.   Objective: Vitals:   02/18/23 2213 02/19/23 0155 02/19/23 0421 02/19/23 0500  BP: (!) 148/62 137/65 (!) 132/57   Pulse: 92 88 85   Resp: 18 18 20    Temp: 97.9 F (36.6 C) 98 F (36.7 C) 97.7 F (36.5 C)   TempSrc:  Oral Oral   SpO2: 98% 96% 100%   Weight: 66.5 kg   64.8 kg  Height: 5\' 3"  (1.6 m)       Intake/Output Summary (Last 24 hours) at 02/19/2023 1107 Last data filed at 02/19/2023 0500 Gross per 24 hour  Intake 240 ml  Output --  Net 240 ml   Filed Weights   02/18/23 1930 02/18/23 2213 02/19/23  0500  Weight: 65.8 kg 66.5 kg 64.8 kg   Examination:  General exam: Appears calm and comfortable  Respiratory system: Clear to auscultation. Respiratory effort normal. Cardiovascular system: normal S1 & S2 heard. No JVD, murmurs, rubs, gallops or clicks. No pedal edema. Gastrointestinal system: Abdomen is nondistended, soft and nontender. No organomegaly or masses felt. Normal bowel sounds heard. Central nervous system: Alert and oriented. No focal neurological deficits. Extremities:  Symmetric 5 x 5 power. Skin: No rashes, lesions or ulcers. Psychiatry: Judgement and insight appear normal. Mood & affect appropriate.   Data Reviewed: I have personally reviewed following labs and imaging studies  CBC: Recent Labs  Lab 02/18/23 0904 02/18/23 1948 02/19/23 0416  WBC 7.4 8.4 5.9  HGB 8.8* 9.0* 8.6*  HCT 27.8* 29.4* 28.5*  MCV 91 97.0 96.9  PLT 285 259 242    Basic Metabolic Panel: Recent Labs  Lab 02/18/23 1948 02/19/23 0416  NA 135 138  K 3.8 3.5  CL 102 105  CO2 23 23  GLUCOSE 153* 105*  BUN 28* 28*  CREATININE 2.28* 2.44*  CALCIUM 8.6* 8.7*    CBG: Recent Labs  Lab 02/18/23 2152 02/19/23 0746  GLUCAP 102* 94    No results found for this or any previous visit (from the past 240 hour(s)).   Radiology Studies: CT HEAD WO CONTRAST ( )  Result Date: 02/18/2023 CLINICAL DATA:  Syncope, fell EXAM: CT HEAD WITHOUT CONTRAST TECHNIQUE: Contiguous axial images were obtained from the base of the skull through the vertex without intravenous contrast. RADIATION DOSE REDUCTION: This exam was performed according to the departmental dose-optimization program which includes automated exposure control, adjustment of the mA and/or kV according to patient size and/or use of iterative reconstruction technique. COMPARISON:  12/21/2022 FINDINGS: Brain: No acute infarct or hemorrhage. Lateral ventricles and midline structures are unremarkable. No acute extra-axial fluid collections. No mass effect. Vascular: No hyperdense vessel or unexpected calcification. Skull: Normal. Negative for fracture or focal lesion. Sinuses/Orbits: No acute finding. Other: None. IMPRESSION: 1. No acute infarct or hemorrhage. Electronically Signed   By: Sharlet Salina M.D.   On: 02/18/2023 21:51   DG Chest 2 View  Result Date: 02/18/2023 CLINICAL DATA:  Shortness of breath, bilateral leg swelling. EXAM: CHEST - 2 VIEW COMPARISON:  01/25/2023. FINDINGS: The heart size and mediastinal contours  are within normal limits. There is atherosclerotic calcification of the aorta. Interstitial prominence is noted bilaterally and mild atelectasis or scarring is noted at the costophrenic angles bilaterally, unchanged from prior exams. No effusion or pneumothorax. Sternotomy wires are noted. Surgical clips are present in the right upper quadrant. IMPRESSION: No active cardiopulmonary disease. Electronically Signed   By: Thornell Sartorius M.D.   On: 02/18/2023 20:08    Scheduled Meds:  aspirin EC  81 mg Oral Daily   buPROPion  300 mg Oral Daily   clopidogrel  75 mg Oral Daily   cyanocobalamin  1,000 mcg Oral Daily   ferrous sulfate  325 mg Oral Q breakfast   folic acid  1 mg Oral Daily   furosemide  80 mg Intravenous BID   heparin  5,000 Units Subcutaneous Q8H   insulin aspart  0-5 Units Subcutaneous QHS   insulin aspart  0-9 Units Subcutaneous TID WC   levothyroxine  75 mcg Oral Q0600   linaclotide  145 mcg Oral QAC breakfast   metoprolol succinate  12.5 mg Oral QHS   pantoprazole  40 mg Oral BID   ranolazine  1,000 mg  Oral BID   rOPINIRole  4 mg Oral QHS   rosuvastatin  40 mg Oral QHS   topiramate  100 mg Oral BID   Continuous Infusions:   LOS: 1 day   Time spent: 37 mins  Dnasia Gauna Laural Benes, MD How to contact the [facility] Attending or Consulting provider 7A - 7P or covering provider during after hours 7P -7A, for this patient?  Check the care team in [facility] and look for a) attending/consulting TR[facility] provider listed and b) the [facility] team listed Log into [email or web address] and use [place] universal password to access. If you do not have the password, please contact the hospital operator. Locate the [facility] provider you are looking for under [facility] and page to a number that you can be directly reached. If you still have difficulty reaching the provider, please page the [facility] (Director on Call) for the Hospitalists listed on amion for assistance.  02/19/2023, 11:07 AM

## 2023-02-19 NOTE — Hospital Course (Signed)
62 y.o. female,  history of diastolic CHF, coronary disease status post CABG times 11/2018 with subsequent PCI for NSTEMI and multiple heart catheterizations, CKD stage IV, anxiety, diabetes mellitus type 2, hypertension, hyperlipidemia, restless leg syndrome, tobacco use in remission  -Patient with recent hospitalization earlier this month secondary to acute on chronic diastolic CHF, where her diuresis has been changed to torsemide 40 mg oral daily, she does report worsening lower extremity edema, for which she was instructed by her cardiologist to increase her torsemide to twice daily, which she has been doing for the last 4 days, but overall she still reports progressive lower extremity edema, and significant weight gain from her dry weight 135, she is currently 145.  She reports her dyspnea at baseline, reports compliance with salt restriction and fluid restriction, as well she does endorse 1 syncopal event yesterday evening, with no trauma. -In ED workup significant for baseline creatinine 2.28, her lower extremity with significant edema +3, hemoglobin at baseline of 9, [facility] hospitalist consulted to admit

## 2023-02-20 DIAGNOSIS — I25119 Atherosclerotic heart disease of native coronary artery with unspecified angina pectoris: Secondary | ICD-10-CM | POA: Diagnosis not present

## 2023-02-20 DIAGNOSIS — I129 Hypertensive chronic kidney disease with stage 1 through stage 4 chronic kidney disease, or unspecified chronic kidney disease: Secondary | ICD-10-CM | POA: Diagnosis not present

## 2023-02-20 DIAGNOSIS — N184 Chronic kidney disease, stage 4 (severe): Secondary | ICD-10-CM | POA: Diagnosis not present

## 2023-02-20 DIAGNOSIS — I5033 Acute on chronic diastolic (congestive) heart failure: Secondary | ICD-10-CM | POA: Diagnosis not present

## 2023-02-20 LAB — BASIC METABOLIC PANEL
Anion gap: 8 (ref 5–15)
BUN: 34 mg/dL — ABNORMAL HIGH (ref 8–23)
CO2: 24 mmol/L (ref 22–32)
Calcium: 8.1 mg/dL — ABNORMAL LOW (ref 8.9–10.3)
Chloride: 102 mmol/L (ref 98–111)
Creatinine, Ser: 2.74 mg/dL — ABNORMAL HIGH (ref 0.44–1.00)
GFR, Estimated: 19 mL/min — ABNORMAL LOW (ref >60)
Glucose, Bld: 130 mg/dL — ABNORMAL HIGH (ref 70–99)
Potassium: 3.4 mmol/L — ABNORMAL LOW (ref 3.5–5.1)
Sodium: 134 mmol/L — ABNORMAL LOW (ref 135–145)

## 2023-02-20 LAB — GLUCOSE, CAPILLARY
Glucose-Capillary: 136 mg/dL — ABNORMAL HIGH (ref 70–99)
Glucose-Capillary: 141 mg/dL — ABNORMAL HIGH (ref 70–99)
Glucose-Capillary: 102 mg/dL — ABNORMAL HIGH (ref 70–99)
Glucose-Capillary: 101 mg/dL — ABNORMAL HIGH (ref 70–99)

## 2023-02-20 LAB — HEMOGLOBIN A1C
Hgb A1c MFr Bld: 4.9 % (ref 4.8–5.6)
Mean Plasma Glucose: 94 mg/dL

## 2023-02-20 MED ORDER — GABAPENTIN 100 MG PO CAPS: 200.0000 mg | ORAL_CAPSULE | Freq: Three times a day (TID) | ORAL | Status: DC | PRN

## 2023-02-20 MED ORDER — DARBEPOETIN ALFA 40 MCG/0.4ML IJ SOSY
40.0000 ug | PREFILLED_SYRINGE | INTRAMUSCULAR | Status: DC
  Administered 2023-02-21: 40 ug via SUBCUTANEOUS
  Filled 2023-02-20: qty 0.4

## 2023-02-20 MED ORDER — FUROSEMIDE 10 MG/ML IJ SOLN
40.0000 mg | Freq: Every day | INTRAMUSCULAR | Status: DC
  Administered 2023-02-21: 40 mg via INTRAVENOUS
  Filled 2023-02-20: qty 4

## 2023-02-20 MED ORDER — POTASSIUM CHLORIDE CRYS ER 20 MEQ PO TBCR
20.0000 meq | EXTENDED_RELEASE_TABLET | Freq: Two times a day (BID) | ORAL | Status: AC
  Administered 2023-02-20 (×2): 20 meq via ORAL
  Filled 2023-02-20 (×2): qty 1

## 2023-02-20 MED ORDER — FUROSEMIDE 10 MG/ML IJ SOLN: 40.0000 mg | Freq: Two times a day (BID) | INTRAMUSCULAR | Status: DC

## 2023-02-20 NOTE — Progress Notes (Signed)
Pt noted with decrease BP while resting. Pt informed "that the  BP is baseline for her while she is at a resting state QHS. " BP rechecked upon wakening and sitting on the side of bed at 97/42.

## 2023-02-20 NOTE — Progress Notes (Addendum)
PROGRESS NOTE   Meghan Terry  ZOX:[id number] DOB: 03-Mar-1961 DOA: 02/18/2023 PCP: Benita Stabile, MD   Chief Complaint  Patient presents with   Leg Swelling   Level of care: Telemetry  Brief Admission History:  62 y.o. female,  history of diastolic CHF, coronary disease status post CABG times 11/2018 with subsequent PCI for NSTEMI and multiple heart catheterizations, CKD stage IV, anxiety, diabetes mellitus type 2, hypertension, hyperlipidemia, restless leg syndrome, tobacco use in remission  -Patient with recent hospitalization earlier this month secondary to acute on chronic diastolic CHF, where her diuresis has been changed to torsemide 40 mg oral daily, she does report worsening lower extremity edema, for which she was instructed by her cardiologist to increase her torsemide to twice daily, which she has been doing for the last 4 days, but overall she still reports progressive lower extremity edema, and significant weight gain from her dry weight 135, she is currently 145.  She reports her dyspnea at baseline, reports compliance with salt restriction and fluid restriction, as well she does endorse 1 syncopal event yesterday evening, with no trauma. -In ED workup significant for baseline creatinine 2.28, her lower extremity with significant edema +3, hemoglobin at baseline of 9, [facility] hospitalist consulted to admit   Assessment and Plan:   Acute on chronic diastolic (congestive) heart failure -3+ pitting bilateral lower extremity edema, 10 pounds fluid retention. -Will be admitted under CHF pathway, continue with IV Lasix 80 mg IV twice daily, daily weight, strict ins and outs, low-salt diet - Recent echo 11/2022 EF of 60 to 65%, G2DD. -Strict input output, daily weights, daily BMP -ReDs Vest reading pending -added TED hoses   Hypertension -  continue with home medications    CKD stage IV -Monitor closely as on IV diuresis -renal function stable, follow up with Dr. 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 Wolfgang Phoenix, he will see pt on Tues next week and planning to start epogen injections, ok to start first dose in hospital -started aranesp injection x 1 dose given 5/26 in hospital prior to discharge    History of CAD status post CABG x 3 -She denies any chest pain, continue with ranolazine, aspirin, Plavix, metoprolol and Crestor   Diabetes mellitus, type II -Continue with Marcelline Deist, will keep on insulin sliding scale, will check A1c--->4.9%   CBG (last 3)  Recent Labs    02/20/23 0736 02/20/23 1112 02/20/23 1630  GLUCAP 136* 141* 102*    Hyperlipidemia -Continue with statin   History of depression -continue with Wellbutrin    DVT prophylaxis: [place] heparin Code Status: DNR  Family Communication:  Disposition: Status is: Inpatient Remains inpatient appropriate because: IV furosemide diuresis    Consultants:   Procedures:   Antimicrobials:    Subjective: Pt pleased that her weight is coming down and leg edema is improving, wanting to get home soon.    Objective: Vitals:   02/20/23 0500 02/20/23 0511 02/20/23 0600 02/20/23 1357  BP:  (!) 70/42 (!) 97/45 (!) 96/56  Pulse:  75 75 81  Resp:  16    Temp:  98 F (36.7 C)  98.7 F (37.1 C)  TempSrc:  Oral  Oral  SpO2:  100%  100%  Weight: 63.5 kg     Height:        Intake/Output Summary (Last 24 hours) at 02/20/2023 1708 Last data filed at 02/20/2023 1300 Gross per 24 hour  Intake 480 ml  Output 850 ml  Net -370 ml   Filed Weights   02/18/23 2213 02/19/23 0500 02/20/23 0500  Weight: 66.5 kg 64.8 kg 63.5 kg   Examination:  General exam: Appears calm and comfortable  Respiratory system: Clear to auscultation. Respiratory effort normal. Cardiovascular  system: normal S1 & S2 heard. No JVD, murmurs, rubs, gallops or clicks. No pedal edema. Gastrointestinal system: Abdomen is nondistended, soft and nontender. No organomegaly or masses felt. Normal bowel sounds heard. Central nervous system: Alert and oriented. No focal neurological deficits. Extremities: Symmetric 5 x 5 power. Skin: No rashes, lesions or ulcers. Psychiatry: Judgement and insight appear normal. Mood & affect appropriate.   Data Reviewed: I have personally reviewed following labs and imaging studies  CBC: Recent Labs  Lab 02/18/23 0904 02/18/23 1948 02/19/23 0416  WBC 7.4 8.4 5.9  HGB 8.8* 9.0* 8.6*  HCT 27.8* 29.4* 28.5*  MCV 91 97.0 96.9  PLT 285 259 242    Basic Metabolic Panel: Recent Labs  Lab 02/18/23 1948 02/19/23 0416 02/20/23 0817  NA 135 138 134*  K 3.8 3.5 3.4*  CL 102 105 102  CO2 23 23 24   GLUCOSE 153* 105* 130*  BUN 28* 28* 34*  CREATININE 2.28* 2.44* 2.74*  CALCIUM 8.6* 8.7* 8.1*    CBG: Recent Labs  Lab 02/19/23 1137 02/19/23 1656 02/20/23 0736 02/20/23 1112 02/20/23 1630  GLUCAP 139* 124* 136* 141* 102*    No results found for this or any previous visit (from the past 240 hour(s)).   Radiology Studies: CT HEAD WO CONTRAST ( )  Result Date: 02/18/2023 CLINICAL DATA:  Syncope, fell EXAM: CT HEAD WITHOUT CONTRAST TECHNIQUE: Contiguous axial images were obtained from the base of the skull through the vertex without intravenous contrast. RADIATION DOSE REDUCTION: This exam was performed according to the departmental dose-optimization program which includes automated exposure control, adjustment of the mA and/or kV according to patient size and/or use of iterative reconstruction technique. COMPARISON:  12/21/2022 FINDINGS: Brain: No acute infarct or hemorrhage. Lateral ventricles and midline structures are unremarkable. No acute extra-axial fluid collections. No mass effect. Vascular: No hyperdense vessel or unexpected calcification.  Skull: Normal. Negative for fracture or focal lesion. Sinuses/Orbits: No acute finding. Other: None. IMPRESSION: 1. No acute infarct or hemorrhage. Electronically Signed   By: Sharlet Salina M.D.   On: 02/18/2023 21:51   DG Chest 2 View  Result Date: 02/18/2023 CLINICAL DATA:  Shortness of breath, bilateral leg swelling. EXAM: CHEST - 2 VIEW COMPARISON:  01/25/2023. FINDINGS: The heart size and mediastinal contours are within normal limits. There is atherosclerotic calcification of the aorta. Interstitial prominence is noted bilaterally and mild atelectasis or scarring is noted at the costophrenic angles bilaterally, unchanged from prior exams. No effusion or pneumothorax. Sternotomy wires are noted. Surgical clips are present in the right upper quadrant. IMPRESSION: No active cardiopulmonary disease. Electronically Signed   By: Thornell Sartorius M.D.   On: 02/18/2023 20:08    Scheduled Meds:  aspirin EC  81 mg Oral Daily   buPROPion  300 mg Oral Daily   clopidogrel  75 mg Oral Daily   cyanocobalamin  1,000 mcg Oral Daily   dapagliflozin propanediol  10 mg Oral QAC breakfast   ferrous sulfate  325 mg  Oral Q breakfast   folic acid  1 mg Oral Daily   [START ON 02/21/2023] furosemide  40 mg Intravenous Daily   heparin  5,000 Units Subcutaneous Q8H   levothyroxine  50 mcg Oral Once per day on Sun Sat   linaclotide  145 mcg Oral QAC breakfast   metoprolol succinate  12.5 mg Oral QHS   pantoprazole  40 mg Oral BID   potassium chloride  20 mEq Oral BID   ranolazine  1,000 mg Oral BID   rOPINIRole  4 mg Oral QHS   rosuvastatin  40 mg Oral QHS   topiramate  100 mg Oral BID   Continuous Infusions:   LOS: 2 days   Time spent: 35 mins  Nathin Saran Laural Benes, MD How to contact the [facility] Attending or Consulting provider 7A - 7P or covering provider during after hours 7P -7A, for this patient?  Check the care team in [facility] and look for a) attending/consulting [facility] provider listed and b) the [facility] team listed Log  into [email or web address] and use [place]'s universal password to access. If you do not have the password, please contact the hospital operator. Locate the [facility] provider you are looking for under [facility] and page to a number that you can be directly reached. If you still have difficulty reaching the provider, please page the [facility] (Director on Call) for the Hospitalists listed on amion for assistance.  02/20/2023, 5:08 PM

## 2023-02-21 DIAGNOSIS — I5033 Acute on chronic diastolic (congestive) heart failure: Secondary | ICD-10-CM | POA: Diagnosis not present

## 2023-02-21 DIAGNOSIS — E785 Hyperlipidemia, unspecified: Secondary | ICD-10-CM | POA: Diagnosis not present

## 2023-02-21 DIAGNOSIS — E1122 Type 2 diabetes mellitus with diabetic chronic kidney disease: Secondary | ICD-10-CM | POA: Diagnosis not present

## 2023-02-21 DIAGNOSIS — N184 Chronic kidney disease, stage 4 (severe): Secondary | ICD-10-CM | POA: Diagnosis not present

## 2023-02-21 LAB — BASIC METABOLIC PANEL
Anion gap: 10 (ref 5–15)
BUN: 37 mg/dL — ABNORMAL HIGH (ref 8–23)
CO2: 21 mmol/L — ABNORMAL LOW (ref 22–32)
Calcium: 8.3 mg/dL — ABNORMAL LOW (ref 8.9–10.3)
Chloride: 104 mmol/L (ref 98–111)
Creatinine, Ser: 2.52 mg/dL — ABNORMAL HIGH (ref 0.44–1.00)
GFR, Estimated: 21 mL/min — ABNORMAL LOW (ref >60)
Glucose, Bld: 118 mg/dL — ABNORMAL HIGH (ref 70–99)
Potassium: 3.7 mmol/L (ref 3.5–5.1)
Sodium: 135 mmol/L (ref 135–145)

## 2023-02-21 LAB — GLUCOSE, CAPILLARY
Glucose-Capillary: 134 mg/dL — ABNORMAL HIGH (ref 70–99)
Glucose-Capillary: 100 mg/dL — ABNORMAL HIGH (ref 70–99)

## 2023-02-21 MED ORDER — FERROUS SULFATE 325 (65 FE) MG PO TABS: 325.0000 mg | ORAL_TABLET | Freq: Every day | ORAL | 1 refills | Status: DC

## 2023-02-21 MED ORDER — LEVOTHYROXINE SODIUM 50 MCG PO TABS: 50.0000 ug | ORAL_TABLET | ORAL | Status: AC

## 2023-02-21 MED ORDER — TRAMADOL HCL 50 MG PO TABS: 50.0000 mg | ORAL_TABLET | Freq: Every day | ORAL | Status: AC | PRN

## 2023-02-21 NOTE — Progress Notes (Signed)
Nsg Discharge Note  Admit Date:  02/18/2023 Discharge date: 02/21/2023   Ollen Bowl to be D/C'd Home per MD order.  AVS completed.    Discharge Medication: Allergies as of 02/21/2023       Reactions   Abilify [aripiprazole]    Ciprofloxacin Nausea And Vomiting   Carbidopa-levodopa Other (See Comments)   Developed tics while taking   Prednisone Other (See Comments)   Turns red all over   Venlafaxine Other (See Comments)   Developed tics while taking - reaction to Effexor        Medication List     TAKE these medications    Acetaminophen Extra Strength 500 MG Tabs Take 2 tablets by mouth 3 times daily as needed for pain   aspirin EC 81 MG tablet Take 1 tablet (81 mg total) by mouth daily.   buPROPion 300 MG 24 hr tablet Commonly known as: WELLBUTRIN XL Take 1 tablet (300 mg total) by mouth daily.   cholecalciferol 25 MCG (1000 UNIT) tablet Commonly known as: VITAMIN D3 Take 1 tablet (1,000 Units total) by mouth in the morning and at bedtime.   clopidogrel 75 MG tablet Commonly known as: PLAVIX TAKE 1 TABLET BY MOUTH DAILY What changed:  how much to take how to take this when to take this   cyanocobalamin 1000 MCG tablet Commonly known as: VITAMIN B12 Take 1 tablet (1,000 mcg total) by mouth daily.   dapagliflozin propanediol 10 MG Tabs tablet Commonly known as: Farxiga Take 1 tablet (10 mg total) by mouth daily before breakfast.   ferrous sulfate 325 (65 FE) MG tablet Take 1 tablet (325 mg total) by mouth daily with breakfast. What changed: when to take this   folic acid 1 MG tablet Commonly known as: FOLVITE Take 1 tablet (1 mg total) by mouth daily.   gabapentin 100 MG capsule Commonly known as: Neurontin Take 3 capsules (300 mg total) by mouth 3 (three) times daily as needed.   levothyroxine 75 MCG tablet Commonly known as: SYNTHROID Take 1 tablet (75 mcg total) by mouth daily Monday thru Friday.   levothyroxine 50 MCG tablet Commonly known  as: SYNTHROID Take 1 tablet (50 mcg total) by mouth See admin instructions. Take 1 tablet in the morning with breakfast on SATURDAY AND SUNDAY.   linaclotide 145 MCG Caps capsule Commonly known as: Linzess Take 1 capsule (145 mcg total) by mouth daily before breakfast.   metoprolol succinate 25 MG 24 hr tablet Commonly known as: TOPROL-XL Take 0.5 tablets (12.5 mg total) by mouth at bedtime. Take with or immediately following a meal.   nitroGLYCERIN 0.4 MG SL tablet Commonly known as: NITROSTAT Place 1 tablet (0.4 mg total) under the tongue every 5 (five) minutes as needed for chest pain.   ondansetron 4 MG tablet Commonly known as: ZOFRAN take 1 tablet (4 mg) by mouth 2 times per day as needed for nausea What changed:  how much to take how to take this when to take this reasons to take this   pantoprazole 40 MG tablet Commonly known as: PROTONIX Take 1 tablet (40 mg total) by mouth 2 (two) times daily.   potassium chloride SA 20 MEQ tablet Commonly known as: KLOR-CON M Take 2 tablets (40 mEq total) by mouth daily. What changed: when to take this   promethazine 25 MG tablet Commonly known as: PHENERGAN Take 1 tablet (25 mg total) by mouth every 6 (six) hours as needed for nausea or vomiting.  ranolazine 1000 MG SR tablet Commonly known as: Ranexa Take 1 tablet (1,000 mg total) by mouth 2 (two) times daily.   REFRESH OP Apply 1 drop to eye 4 (four) times daily.   rOPINIRole 4 MG tablet Commonly known as: REQUIP Take 1 tablet (4 mg total) by mouth 1 to 3 hours before bedtime.   rosuvastatin 40 MG tablet Commonly known as: CRESTOR Take 1 tablet (40 mg total) by mouth at bedtime.   topiramate 100 MG tablet Commonly known as: TOPAMAX TAKE 1 TABLET BY MOUTH TWICE DAILY.   torsemide 20 MG tablet Commonly known as: DEMADEX Take 2 tablets (40 mg total) by mouth 2 (two) times daily.   traMADol 50 MG tablet Commonly known as: ULTRAM Take 1 tablet (50 mg total) by  mouth daily as needed for moderate pain.   traZODone 50 MG tablet Commonly known as: DESYREL Take 1 tablet (50 mg total) by mouth daily.   traZODone 150 MG tablet Commonly known as: DESYREL Take 1 tablet (150 mg total) by mouth in the morning and at bedtime.        Discharge Assessment: Vitals:   02/20/23 2012 02/21/23 0509  BP: (!) 122/57 (!) 110/52  Pulse: 86 79  Resp: 14 14  Temp: 98.1 F (36.7 C) 98 F (36.7 C)  SpO2: 100% 100%   Skin clean, dry and intact without evidence of skin break down, no evidence of skin tears noted. IV catheter discontinued intact. Site without signs and symptoms of complications - no redness or edema noted at insertion site, patient denies c/o pain - only slight tenderness at site.  Dressing with slight pressure applied.  D/c Instructions-Education: Discharge instructions given to patient/family with verbalized understanding. D/c education completed with patient/family including follow up instructions, medication list, d/c activities limitations if indicated, with other d/c instructions as indicated by MD - patient able to verbalize understanding, all questions fully answered. Patient instructed to return to ED, call 911, or call MD for any changes in condition.  Patient escorted via WC, and D/C home via private auto.  Laurena Spies, RN 02/21/2023 9:25 AM

## 2023-02-21 NOTE — Discharge Summary (Addendum)
Physician Discharge Summary  MURSAL SELL WUX:[id number] DOB: 1960/11/04 DOA: 02/18/2023  PCP: Benita Stabile, MD Nephrologist: Dr. Wolfgang Phoenix   Admit date: 02/18/2023 Discharge date: 02/21/2023  Admitted From:  Home  [facility]   Recommendations for Outpatient Follow-up:  Follow up with PCP in 1 weeks Follow up with Dr Wolfgang Phoenix in 2 days as scheduled Follow up with cardiology on 02/24/23 as scheduled   Please obtain BMP in 2 days in renal clinic at appt   Home Health:   RN   Discharge Condition: STABLE   CODE STATUS: DNR DIET: Renal low sodium with fluid restriction   Brief Hospitalization Summary: Please see all hospital notes, images, labs for full details of the hospitalization. ADMISSION PROVIDER HPI:  62 y.o. female,  history of diastolic CHF, coronary disease status post CABG times 11/2018 with subsequent PCI for NSTEMI and multiple heart catheterizations, CKD stage IV, anxiety, diabetes mellitus type 2, hypertension, hyperlipidemia, restless leg syndrome, tobacco use in remission  -Patient with recent hospitalization earlier this month secondary to acute on chronic diastolic CHF, where her diuresis has been changed to torsemide 40 mg oral daily, she does report worsening lower extremity edema, for which she was instructed by her cardiologist to increase her torsemide to twice daily, which she has been doing for the last 4 days, but overall she still reports progressive lower extremity edema, and significant weight gain from her dry weight 135, she is currently 145.  She reports her dyspnea at baseline, reports compliance with salt restriction and fluid restriction, as well she does endorse 1 syncopal event yesterday evening, with no trauma. -In ED workup significant for baseline creatinine 2.28, her lower extremity with significant edema +3, hemoglobin at baseline of 9, [facility] consulted to admit.    HOSPITAL COURSE BY PROBLEM   Acute on chronic diastolic  (congestive) heart failure -3+ pitting bilateral lower extremity edema, 10 pounds fluid retention. -Will be admitted under CHF pathway, continue with IV Lasix 80 mg IV twice daily, daily weight, strict ins and outs, low-salt diet - Recent echo 11/2022 EF of 60 to 65%, G2DD. -Strict input output, daily weights, daily BMP -ReDs Vest reading not done after multiple requests -added TED hoses -Pt has requested to go home as she reports feeling much better and has close outpatient follow up arranged -Pt will see nephrology on 02/23/23 and cardiology on 02/24/23. -DC home today Filed Weights   02/19/23 0500 02/20/23 0500 02/21/23 0500  Weight: 64.8 kg 63.5 kg 63.5 kg    Intake/Output Summary (Last 24 hours) at 02/21/2023 0913 Last data filed at 02/21/2023 0500 Gross per 24 hour  Intake 840 ml  Output 2000 ml  Net -1160 ml     Hypertension -  continue with home medications    CKD stage IV -Monitor closely as on IV diuresis -renal function stable, follow up with Dr. 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 Wolfgang Phoenix, he will see pt on Tues in 2 days and planning to start epogen injections, ok to start first dose in hospital -started aranesp injection x 1 dose given 5/26 in hospital prior to discharge    History of CAD status post CABG x 3 -She  denies any chest pain, continue with ranolazine, aspirin, Plavix, metoprolol and Crestor   Diabetes mellitus, type II -Continue with Marcelline Deist, will keep on insulin sliding scale, will check A1c--->4.9%   CBG (last 3)  Recent Labs    02/20/23 2105 02/21/23 0250 02/21/23 0724  GLUCAP 101* 134* 100*   Hyperlipidemia -Continue with statin   History of depression -continue with Wellbutrin     DVT  prophylaxis: [place] heparin Code Status: DNR  Family Communication:  Disposition: Status is: Inpatient Remains inpatient appropriate because: IV furosemide diuresis     Discharge Diagnoses:  Principal Problem:   Acute on chronic diastolic CHF (congestive heart failure) (HCC) Active Problems:   Hypothyroid   Coronary artery disease involving native coronary artery of native heart with angina pectoris (HCC)   Hyperlipidemia with target LDL less than 70   Anemia due to stage 4 chronic kidney disease (HCC)   Benign hypertension with CKD (chronic kidney disease) stage IV (HCC)   Diabetes mellitus with stage 4 chronic kidney disease GFR 15-29 (HCC)   Discharge Instructions:  Allergies as of 02/21/2023       Reactions   Abilify [aripiprazole]    Ciprofloxacin Nausea And Vomiting   Carbidopa-levodopa Other (See Comments)   Developed tics while taking   Prednisone Other (See Comments)   Turns red all over   Venlafaxine Other (See Comments)   Developed tics while taking - reaction to Effexor        Medication List     TAKE these medications    Acetaminophen Extra Strength 500 MG Tabs Take 2 tablets by mouth 3 times daily as needed for pain   aspirin EC 81 MG tablet Take 1 tablet (81 mg total) by mouth daily.   buPROPion 300 MG 24 hr tablet Commonly known as: WELLBUTRIN XL Take 1 tablet (300 mg total) by mouth daily.   cholecalciferol 25 MCG (1000 UNIT) tablet Commonly known as: VITAMIN D3 Take 1 tablet (1,000 Units total) by mouth in the morning and at bedtime.   clopidogrel 75 MG tablet Commonly known as: PLAVIX TAKE 1 TABLET BY MOUTH DAILY What changed:  how much to take how to take this when to take this   cyanocobalamin 1000 MCG tablet Commonly known as: VITAMIN B12 Take 1 tablet (1,000 mcg total) by mouth daily.   dapagliflozin propanediol 10 MG Tabs tablet Commonly known as: Farxiga Take 1 tablet (10 mg total) by mouth daily before breakfast.   ferrous  sulfate 325 (65 FE) MG tablet Take 1 tablet (325 mg total) by mouth daily with breakfast. What changed: when to take this   folic acid 1 MG tablet Commonly known as: FOLVITE Take 1 tablet (1 mg total) by mouth daily.   gabapentin 100 MG capsule Commonly known as: Neurontin Take 3 capsules (300 mg total) by mouth 3 (three) times daily as needed.   levothyroxine 75 MCG tablet Commonly known as: SYNTHROID Take 1 tablet (75 mcg total) by mouth daily Monday thru Friday.   levothyroxine 50 MCG tablet Commonly known as: SYNTHROID Take 1 tablet (50 mcg total) by mouth See admin instructions. Take 1 tablet in the morning with breakfast on SATURDAY AND SUNDAY.   linaclotide 145 MCG Caps capsule Commonly known as: Linzess Take 1 capsule (145 mcg total) by mouth daily before breakfast.   metoprolol succinate 25 MG 24 hr tablet Commonly known as: TOPROL-XL Take 0.5 tablets (12.5 mg total) by mouth at bedtime. Take with or immediately following a meal.  nitroGLYCERIN 0.4 MG SL tablet Commonly known as: NITROSTAT Place 1 tablet (0.4 mg total) under the tongue every 5 (five) minutes as needed for chest pain.   ondansetron 4 MG tablet Commonly known as: ZOFRAN take 1 tablet (4 mg) by mouth 2 times per day as needed for nausea What changed:  how much to take how to take this when to take this reasons to take this   pantoprazole 40 MG tablet Commonly known as: PROTONIX Take 1 tablet (40 mg total) by mouth 2 (two) times daily.   potassium chloride SA 20 MEQ tablet Commonly known as: KLOR-CON M Take 2 tablets (40 mEq total) by mouth daily. What changed: when to take this   promethazine 25 MG tablet Commonly known as: PHENERGAN Take 1 tablet (25 mg total) by mouth every 6 (six) hours as needed for nausea or vomiting.   ranolazine 1000 MG SR tablet Commonly known as: Ranexa Take 1 tablet (1,000 mg total) by mouth 2 (two) times daily.   REFRESH OP Apply 1 drop to eye 4 (four)  times daily.   rOPINIRole 4 MG tablet Commonly known as: REQUIP Take 1 tablet (4 mg total) by mouth 1 to 3 hours before bedtime.   rosuvastatin 40 MG tablet Commonly known as: CRESTOR Take 1 tablet (40 mg total) by mouth at bedtime.   topiramate 100 MG tablet Commonly known as: TOPAMAX TAKE 1 TABLET BY MOUTH TWICE DAILY.   torsemide 20 MG tablet Commonly known as: DEMADEX Take 2 tablets (40 mg total) by mouth 2 (two) times daily.   traMADol 50 MG tablet Commonly known as: ULTRAM Take 1 tablet (50 mg total) by mouth daily as needed for moderate pain.   traZODone 50 MG tablet Commonly known as: DESYREL Take 1 tablet (50 mg total) by mouth daily.   traZODone 150 MG tablet Commonly known as: DESYREL Take 1 tablet (150 mg total) by mouth in the morning and at bedtime.        Follow-up Information     [facility].   Specialty: Home Health Services Why: RN will call to schedule your home visit. Contact information: [address] [place] [address] [phone]         Benita Stabile, MD Follow up in 1 week(s).   Specialty: Internal Medicine Why: Hospital Follow Up Contact information: [address] [place] [facility] [address] [phone]         Randa Lynn, MD. Go on 02/23/2023.   Specialty: Nephrology Why: Hospital Follow Up Contact information: [address] [place] [address] [phone]                Allergies  Allergen Reactions   Abilify [Aripiprazole]    Ciprofloxacin Nausea And Vomiting   Carbidopa-Levodopa Other (See Comments)    Developed tics while taking    Prednisone Other (See Comments)    Turns red all over    Venlafaxine Other (See Comments)    Developed tics while taking - reaction to Effexor    Allergies as of 02/21/2023       Reactions   Abilify [aripiprazole]    Ciprofloxacin Nausea And Vomiting   Carbidopa-levodopa Other (See Comments)   Developed tics while  taking   Prednisone Other (See Comments)   Turns red all over   Venlafaxine Other (See Comments)   Developed tics while taking - reaction to Effexor        Medication  List     TAKE these medications    Acetaminophen Extra Strength 500 MG Tabs Take 2 tablets by mouth 3 times daily as needed for pain   aspirin EC 81 MG tablet Take 1 tablet (81 mg total) by mouth daily.   buPROPion 300 MG 24 hr tablet Commonly known as: WELLBUTRIN XL Take 1 tablet (300 mg total) by mouth daily.   cholecalciferol 25 MCG (1000 UNIT) tablet Commonly known as: VITAMIN D3 Take 1 tablet (1,000 Units total) by mouth in the morning and at bedtime.   clopidogrel 75 MG tablet Commonly known as: PLAVIX TAKE 1 TABLET BY MOUTH DAILY What changed:  how much to take how to take this when to take this   cyanocobalamin 1000 MCG tablet Commonly known as: VITAMIN B12 Take 1 tablet (1,000 mcg total) by mouth daily.   dapagliflozin propanediol 10 MG Tabs tablet Commonly known as: Farxiga Take 1 tablet (10 mg total) by mouth daily before breakfast.   ferrous sulfate 325 (65 FE) MG tablet Take 1 tablet (325 mg total) by mouth daily with breakfast. What changed: when to take this   folic acid 1 MG tablet Commonly known as: FOLVITE Take 1 tablet (1 mg total) by mouth daily.   gabapentin 100 MG capsule Commonly known as: Neurontin Take 3 capsules (300 mg total) by mouth 3 (three) times daily as needed.   levothyroxine 75 MCG tablet Commonly known as: SYNTHROID Take 1 tablet (75 mcg total) by mouth daily Monday thru Friday.   levothyroxine 50 MCG tablet Commonly known as: SYNTHROID Take 1 tablet (50 mcg total) by mouth See admin instructions. Take 1 tablet in the morning with breakfast on SATURDAY AND SUNDAY.   linaclotide 145 MCG Caps capsule Commonly known as: Linzess Take 1 capsule (145 mcg total) by mouth daily before breakfast.   metoprolol succinate 25 MG 24 hr tablet Commonly known as:  TOPROL-XL Take 0.5 tablets (12.5 mg total) by mouth at bedtime. Take with or immediately following a meal.   nitroGLYCERIN 0.4 MG SL tablet Commonly known as: NITROSTAT Place 1 tablet (0.4 mg total) under the tongue every 5 (five) minutes as needed for chest pain.   ondansetron 4 MG tablet Commonly known as: ZOFRAN take 1 tablet (4 mg) by mouth 2 times per day as needed for nausea What changed:  how much to take how to take this when to take this reasons to take this   pantoprazole 40 MG tablet Commonly known as: PROTONIX Take 1 tablet (40 mg total) by mouth 2 (two) times daily.   potassium chloride SA 20 MEQ tablet Commonly known as: KLOR-CON M Take 2 tablets (40 mEq total) by mouth daily. What changed: when to take this   promethazine 25 MG tablet Commonly known as: PHENERGAN Take 1 tablet (25 mg total) by mouth every 6 (six) hours as needed for nausea or vomiting.   ranolazine 1000 MG SR tablet Commonly known as: Ranexa Take 1 tablet (1,000 mg total) by mouth 2 (two) times daily.   REFRESH OP Apply 1 drop to eye 4 (four) times daily.   rOPINIRole 4 MG tablet Commonly known as: REQUIP Take 1 tablet (4 mg total) by mouth 1 to 3 hours before bedtime.   rosuvastatin 40 MG tablet Commonly known as: CRESTOR Take 1 tablet (40 mg total) by mouth at bedtime.   topiramate 100 MG tablet Commonly known as: TOPAMAX TAKE 1 TABLET BY MOUTH TWICE DAILY.   torsemide 20 MG tablet  Commonly known as: DEMADEX Take 2 tablets (40 mg total) by mouth 2 (two) times daily.   traMADol 50 MG tablet Commonly known as: ULTRAM Take 1 tablet (50 mg total) by mouth daily as needed for moderate pain.   traZODone 50 MG tablet Commonly known as: DESYREL Take 1 tablet (50 mg total) by mouth daily.   traZODone 150 MG tablet Commonly known as: DESYREL Take 1 tablet (150 mg total) by mouth in the morning and at bedtime.        Procedures/Studies: CT HEAD WO CONTRAST ( )  Result  Date: 02/18/2023 CLINICAL DATA:  Syncope, fell EXAM: CT HEAD WITHOUT CONTRAST TECHNIQUE: Contiguous axial images were obtained from the base of the skull through the vertex without intravenous contrast. RADIATION DOSE REDUCTION: This exam was performed according to the departmental dose-optimization program which includes automated exposure control, adjustment of the mA and/or kV according to patient size and/or use of iterative reconstruction technique. COMPARISON:  12/21/2022 FINDINGS: Brain: No acute infarct or hemorrhage. Lateral ventricles and midline structures are unremarkable. No acute extra-axial fluid collections. No mass effect. Vascular: No hyperdense vessel or unexpected calcification. Skull: Normal. Negative for fracture or focal lesion. Sinuses/Orbits: No acute finding. Other: None. IMPRESSION: 1. No acute infarct or hemorrhage. Electronically Signed   By: Sharlet Salina M.D.   On: 02/18/2023 21:51   DG Chest 2 View  Result Date: 02/18/2023 CLINICAL DATA:  Shortness of breath, bilateral leg swelling. EXAM: CHEST - 2 VIEW COMPARISON:  01/25/2023. FINDINGS: The heart size and mediastinal contours are within normal limits. There is atherosclerotic calcification of the aorta. Interstitial prominence is noted bilaterally and mild atelectasis or scarring is noted at the costophrenic angles bilaterally, unchanged from prior exams. No effusion or pneumothorax. Sternotomy wires are noted. Surgical clips are present in the right upper quadrant. IMPRESSION: No active cardiopulmonary disease. Electronically Signed   By: Thornell Sartorius M.D.   On: 02/18/2023 20:08   DG Abd 2 Views  Result Date: 02/06/2023 CLINICAL DATA:  Prior to MRI scan.  Clearance for stomach clips. EXAM: ABDOMEN - 2 VIEW COMPARISON:  CT of the abdomen and pelvis December 21, 2022. KUB May 23, 2023 FINDINGS: Two radiopaque linear foreign bodies are identified in the left upper quadrant. These have the appearance of distal leads or clips.  Recommend clinical correlation. The structures were not present on the previous CT scan. Surgical clips are identified in the right upper quadrant. Mild hazy opacity in left base identified. No free air, portal venous gas, or pneumatosis. Mild fecal loading in the colon. No other abnormalities are identified. IMPRESSION: 1. Two radiopaque linear foreign bodies in the left upper quadrant have the appearance of distal leads or clips. 2. Surgical clips in the right upper quadrant. 3. Mild hazy opacity in the left base could represent atelectasis or early infiltrate. 4. Mild fecal loading in the colon. Electronically Signed   By: Gerome Sam III M.D.   On: 02/06/2023 13:28   DG Chest Port 1 View  Result Date: 01/25/2023 CLINICAL DATA:  Exertional dyspnea EXAM: PORTABLE CHEST 1 VIEW COMPARISON:  CXR 01/18/23 FINDINGS: No pleural effusion. No pneumothorax. No focal airspace opacity. Normal cardiac and mediastinal contours. Status post median sternotomy CABG. No radiographically apparent displaced rib fractures. Visualized upper abdomen is notable for surgical clips in the right upper quadrant. IMPRESSION: No focal airspace opacity. Electronically Signed   By: Lorenza Cambridge M.D.   On: 01/25/2023 15:30     Subjective: Pt eager to  go home, says she felt better since yesterday, says she will follow up with nephrology in 2 days and happy that weight is down   Discharge Exam: Vitals:   02/20/23 2012 02/21/23 0509  BP: (!) 122/57 (!) 110/52  Pulse: 86 79  Resp: 14 14  Temp: 98.1 F (36.7 C) 98 F (36.7 C)  SpO2: 100% 100%   Vitals:   02/20/23 1357 02/20/23 2012 02/21/23 0500 02/21/23 0509  BP: (!) 96/56 (!) 122/57  (!) 110/52  Pulse: 81 86  79  Resp:  14  14  Temp: 98.7 F (37.1 C) 98.1 F (36.7 C)  98 F (36.7 C)  TempSrc: Oral Oral  Oral  SpO2: 100% 100%  100%  Weight:   63.5 kg   Height:       General: Pt is alert, awake, not in acute distress Cardiovascular: RRR, S1/S2 +, no rubs, no  gallops Respiratory: CTA bilaterally, no wheezing, no rhonchi Abdominal: Soft, NT, ND, bowel sounds + Extremities: trace pretibial edema, no cyanosis   The results of significant diagnostics from this hospitalization (including imaging, microbiology, ancillary and laboratory) are listed below for reference.     Microbiology: No results found for this or any previous visit (from the past 240 hour(s)).   Labs: BNP (last 3 results) Recent Labs    01/18/23 1059 01/25/23 1522 02/18/23 1948  BNP 178.0* 243.0* 56.0   Basic Metabolic Panel: Recent Labs  Lab 02/18/23 1948 02/19/23 0416 02/20/23 0817 02/21/23 0455  NA 135 138 134* 135  K 3.8 3.5 3.4* 3.7  CL 102 105 102 104  CO2 23 23 24  21*  GLUCOSE 153* 105* 130* 118*  BUN 28* 28* 34* 37*  CREATININE 2.28* 2.44* 2.74* 2.52*  CALCIUM 8.6* 8.7* 8.1* 8.3*   Liver Function Tests: No results for input(s): "AST", "ALT", "ALKPHOS", "BILITOT", "PROT", "ALBUMIN" in the last 168 hours. No results for input(s): "LIPASE", "AMYLASE" in the last 168 hours. No results for input(s): "AMMONIA" in the last 168 hours. CBC: Recent Labs  Lab 02/18/23 0904 02/18/23 1948 02/19/23 0416  WBC 7.4 8.4 5.9  HGB 8.8* 9.0* 8.6*  HCT 27.8* 29.4* 28.5*  MCV 91 97.0 96.9  PLT 285 259 242   Cardiac Enzymes: No results for input(s): "CKTOTAL", "CKMB", "CKMBINDEX", "TROPONINI" in the last 168 hours. BNP: Invalid input(s): "POCBNP" CBG: Recent Labs  Lab 02/20/23 1112 02/20/23 1630 02/20/23 2105 02/21/23 0250 02/21/23 0724  GLUCAP 141* 102* 101* 134* 100*   D-Dimer No results for input(s): "DDIMER" in the last 72 hours. Hgb A1c Recent Labs    02/19/23 0416  HGBA1C 4.9   Lipid Profile No results for input(s): "CHOL", "HDL", "LDLCALC", "TRIG", "CHOLHDL", "LDLDIRECT" in the last 72 hours. Thyroid function studies No results for input(s): "TSH", "T4TOTAL", "T3FREE", "THYROIDAB" in the last 72 hours.  Invalid input(s): "FREET3" Anemia  work up No results for input(s): "VITAMINB12", "FOLATE", "FERRITIN", "TIBC", "IRON", "RETICCTPCT" in the last 72 hours. Urinalysis    Component Value Date/Time   COLORURINE YELLOW 01/25/2023 1410   APPEARANCEUR CLEAR 01/25/2023 1410   LABSPEC 1.006 01/25/2023 1410   PHURINE 7.0 01/25/2023 1410   GLUCOSEU >=500 (A) 01/25/2023 1410   HGBUR NEGATIVE 01/25/2023 1410   BILIRUBINUR NEGATIVE 01/25/2023 1410   KETONESUR NEGATIVE 01/25/2023 1410   PROTEINUR NEGATIVE 01/25/2023 1410   NITRITE NEGATIVE 01/25/2023 1410   LEUKOCYTESUR NEGATIVE 01/25/2023 1410   Sepsis Labs Recent Labs  Lab 02/18/23 0904 02/18/23 1948 02/19/23 0416  WBC 7.4 8.4  5.9   Microbiology No results found for this or any previous visit (from the past 240 hour(s)).  Time coordinating discharge: 35 mins   SIGNED:  Standley Dakins, MD  [facility] 02/21/2023, 9:07 AM How to contact the [facility] Attending or Consulting provider 7A - 7P or covering provider during after hours 7P -7A, for this patient?  Check the care team in [facility] and look for a) attending/consulting [facility] provider listed and b) the [facility] team listed Log into [email or web address] and use [place]'s universal password to access. If you do not have the password, please contact the hospital operator. Locate the [facility] provider you are looking for under [facility] and page to a number that you can be directly reached. If you still have difficulty reaching the provider, please page the [facility] (Director on Call) for the Hospitalists listed on amion for assistance.

## 2023-02-21 NOTE — Discharge Instructions (Signed)

## 2023-02-23 ENCOUNTER — Other Ambulatory Visit (HOSPITAL_COMMUNITY): Payer: Self-pay | Admitting: Family Medicine

## 2023-02-23 ENCOUNTER — Other Ambulatory Visit: Payer: Self-pay

## 2023-02-23 DIAGNOSIS — Z1231 Encounter for screening mammogram for malignant neoplasm of breast: Secondary | ICD-10-CM

## 2023-02-23 NOTE — Progress Notes (Deleted)
Cardiology Office Note:    Date:  02/23/2023   ID:  Meghan Terry, DOB [birth year], MRN [id number]  PCP:  Benita Stabile, MD  [facility] Providers Cardiologist:  Marjo Bicker, MD { Click to update primary MD,subspecialty MD or APP then REFRESH:1}    Referring MD: Benita Stabile, MD   Chief Complaint:  No chief complaint on file. {Click here for Visit Info    :1}    History of Present Illness:   Meghan Terry is a 62 y.o. female with a hx of CAD (s/p CABG in 2020 with LIMA-LAD, SVG-OM and SVG-PDA, prior subtotal occlusion of PDA which was felt to be due to spasm, DESx3 to RCA in 10/2019, cath in 12/2020 showing patent grafts and medical management recommended), HTN, HLD, Type II DM, Stage 4 CKD, anemia (EGD showing esophagitis and gastritis) and HFpEF.  Patient seen in the hospital 01/27/23 for for acute CHF and diuresed close to 3 L. Weight 135 lbs. Readmitted 02/18/23  with recurrent CHF and 10 lb weight gain. Crt 2.28. Diuresed with IV lasix.      Past Medical History:  Diagnosis Date   Acute diastolic CHF (congestive heart failure) (HCC) 06/08/2021   Acute pulmonary edema (HCC)    Anemia due to stage 4 chronic kidney disease (HCC) 07/20/2022   Angina pectoris (HCC) 11/17/2019   Anxiety    Atypical chest pain 12/30/2020   Benign hypertension with CKD (chronic kidney disease) stage IV (HCC) 07/20/2022   CHF (congestive heart failure) (HCC) 06/08/2021   CKD (chronic kidney disease) stage 3b, GFR 30-59 ml/min 12/03/2019   Coronary artery disease    Coronary artery disease involving native coronary artery of native heart with angina pectoris (HCC) 10/03/2019   Depression 04/25/2017   Diabetes mellitus due to underlying condition with unspecified complications (HCC) 09/05/2019   Diabetes mellitus with stage 4 chronic kidney disease GFR 15-29 (HCC) 07/20/2022   Essential hypertension 09/05/2019   Ex-smoker 09/05/2019   Family history of coronary artery disease 09/05/2019    History of depression 04/15/2020   Hx of diabetes mellitus 04/15/2020   Hyperlipidemia with target LDL less than 70 11/24/2019   Hyperphosphatemia 07/20/2022   Hypertension    Hypokalemia 04/30/2022   Hypothyroid    Migraine 04/25/2017   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   Syncope and collapse 04/29/2022   Thyroid disease    UTI (urinary tract infection) 04/30/2022   Current Medications: No outpatient medications have been marked as taking for the 03/01/23 encounter (Appointment) with Dyann Kief, PA-C.    Allergies:   Abilify [aripiprazole], Ciprofloxacin, Carbidopa-levodopa, Prednisone, and Venlafaxine   Social History   Tobacco Use   Smoking status: Former    Packs/day: 1.00    Years: 47.00    Additional pack years: 0.00    Total pack years: 47.00    Types: Cigarettes    Quit date: 09/15/2019    Years since quitting: 3.4    Passive exposure: Past   Smokeless tobacco: Never  Vaping Use   Vaping Use: Every day   Start date: 09/15/2019   Substances: Nicotine  Substance Use Topics  Alcohol use: No   Drug use: No    Family Hx: The patient's family history includes Asthma in her mother; COPD in her mother; Congestive Heart Failure in her father; Diabetes in her mother; Macular degeneration in her mother.  ROS     Physical Exam:    VS:  There were no vitals taken for this visit.    Wt Readings from Last 3 Encounters:  02/21/23 140 lb 1.4 oz (63.5 kg)  02/02/23 140 lb (63.5 kg)  01/27/23 135 lb 12.9 oz (61.6 kg)    Physical Exam  GEN: Well nourished, well developed, in no acute distress  HEENT: normal  Neck: no JVD, carotid bruits, or  masses Cardiac:RRR; no murmurs, rubs, or gallops  Respiratory:  clear to auscultation bilaterally, normal work of breathing GI: soft, nontender, nondistended, + BS Ext: without cyanosis, clubbing, or edema, Good distal pulses bilaterally MS: no deformity or atrophy  Skin: warm and dry, no rash Neuro:  Alert and Oriented x 3, Strength and sensation are intact Psych: euthymic mood, full affect        EKGs/Labs/Other Test Reviewed:    EKG:  EKG is *** ordered today.  The ekg ordered today demonstrates ***  Recent Labs: 04/30/2022: TSH 1.701 01/25/2023: ALT 11; Magnesium 2.3 02/18/2023: B Natriuretic Peptide 56.0 02/19/2023: Hemoglobin 8.6; Platelets 242 02/21/2023: BUN 37; Creatinine, Ser 2.52; Potassium 3.7; Sodium 135   Recent Lipid Panel Recent Labs    01/27/23 0336  CHOL 115  TRIG 193*  HDL 56  VLDL 39  LDLCALC 20     Prior CV Studies: ECHO COMPLETE WO IMAGING ENHANCING AGENT 12/22/2022  Narrative ECHOCARDIOGRAM REPORT    Patient Name:   Meghan Terry Date of Exam: 12/22/2022 Medical Rec #:  [id number]    Height:       63.0 in Accession #:    [id number]   Weight:       131.8 lb Date of Birth:  1961/01/12     BSA:          1.620 m Patient Age:    61 years     BP:           93/39 mmHg Patient Gender: F            HR:           81 bpm. [facility]  Procedure: 2D Echo, Cardiac Doppler and Color Doppler  Indications:    Chest Pain R07.9  History:        Patient has prior history of Echocardiogram examinations, most recent 04/30/2022. CHF, CAD, Prior CABG, Signs/Symptoms:Chest Pain; Risk Factors:Former Smoker, Hypertension and Diabetes.  Sonographer:    Aron Baba Referring Phys: [phone] DAVID TAT   Sonographer Comments: Image acquisition challenging due to patient body habitus and Image acquisition challenging due to respiratory motion. IMPRESSIONS   1. Left ventricular ejection fraction, by estimation, is 60 to 65%. The left ventricle has normal  function. The left ventricle has no regional wall motion abnormalities. Left ventricular diastolic parameters are consistent with Grade II diastolic dysfunction (pseudonormalization). 2. Right ventricular systolic function is normal. The right ventricular size is normal. There is normal pulmonary artery systolic pressure. The estimated right ventricular systolic pressure is 19.0 mmHg. 3. Left atrial size was mildly dilated. 4. The mitral valve is grossly normal. Mild mitral valve regurgitation. 5. The aortic valve is tricuspid. There is mild calcification of the aortic valve. Aortic valve regurgitation is not visualized. Aortic  valve sclerosis is present, with no evidence of aortic valve stenosis. 6. The inferior vena cava is normal in size with greater than 50% respiratory variability, suggesting right atrial pressure of 3 mmHg.  Comparison(s): Prior images reviewed side by side. LVEF remains normal range at 60-65%. RV contraction is normal.  FINDINGS Left Ventricle: Left ventricular ejection fraction, by estimation, is 60 to 65%. The left ventricle has normal function. The left ventricle has no regional wall motion abnormalities. The left ventricular internal cavity size was normal in size. There is no left ventricular hypertrophy. Left ventricular diastolic parameters are consistent with Grade II diastolic dysfunction (pseudonormalization).  Right Ventricle: The right ventricular size is normal. No increase in right ventricular wall thickness. Right ventricular systolic function is normal. There is normal pulmonary artery systolic pressure. The tricuspid regurgitant velocity is 2.00 m/s, and with an assumed right atrial pressure of 3 mmHg, the estimated right ventricular systolic pressure is 19.0 mmHg.  Left Atrium: Left atrial size was mildly dilated.  Right Atrium: Right atrial size was normal in size.  Pericardium: There is no evidence of pericardial effusion.  Mitral Valve: The mitral  valve is grossly normal. Mild mitral valve regurgitation.  Tricuspid Valve: The tricuspid valve is grossly normal. Tricuspid valve regurgitation is trivial.  Aortic Valve: The aortic valve is tricuspid. There is mild calcification of the aortic valve. Aortic valve regurgitation is not visualized. Aortic valve sclerosis is present, with no evidence of aortic valve stenosis.  Pulmonic Valve: The pulmonic valve was grossly normal. Pulmonic valve regurgitation is not visualized.  Aorta: The aortic root is normal in size and structure.  Venous: The inferior vena cava is normal in size with greater than 50% respiratory variability, suggesting right atrial pressure of 3 mmHg.  IAS/Shunts: No atrial level shunt detected by color flow Doppler.   LEFT VENTRICLE PLAX 2D LVIDd:         4.00 cm   Diastology LVIDs:         2.60 cm   LV e' medial:    5.66 cm/s LV PW:         0.90 cm   LV E/e' medial:  18.2 LV IVS:        0.80 cm   LV e' lateral:   8.59 cm/s LVOT diam:     1.90 cm   LV E/e' lateral: 12.0 LV SV:         56 LV SV Index:   35 LVOT Area:     2.84 cm   RIGHT VENTRICLE RV S prime:     7.42 cm/s TAPSE (M-mode): 1.1 cm  LEFT ATRIUM             Index        RIGHT ATRIUM           Index LA diam:        3.30 cm 2.04 cm/m   RA Area:     14.90 cm LA Vol (A2C):   53.9 ml 33.28 ml/m  RA Volume:   36.10 ml  22.29 ml/m LA Vol (A4C):   64.0 ml 39.51 ml/m LA Biplane Vol: 60.6 ml 37.41 ml/m AORTIC VALVE LVOT Vmax:   85.30 cm/s LVOT Vmean:  58.500 cm/s LVOT VTI:    0.198 m  AORTA Ao Root diam: 3.40 cm Ao Asc diam:  3.10 cm  MITRAL VALVE                TRICUSPID VALVE MV Area (PHT): 4.29  cm     TR Peak grad:   16.0 mmHg MV Decel Time: 177 msec     TR Vmax:        200.00 cm/s MR Peak grad: 65.9 mmHg MR Vmax:      406.00 cm/s   SHUNTS MV E velocity: 103.00 cm/s  Systemic VTI:  0.20 m MV A velocity: 83.70 cm/s   Systemic Diam: 1.90 cm MV E/A ratio:  1.23  Nona Dell  MD Electronically signed by Nona Dell MD Signature Date/Time: 12/22/2022/10:56:29 AM    Final        Risk Assessment/Calculations/Metrics:   {Does this patient have ATRIAL FIBRILLATION?:[phone]}     No BP recorded.  {Refresh Note OR Click here to enter BP  :1}***    ASSESSMENT & PLAN:   No problem-specific Assessment & Plan notes found for this encounter.   Chronic HFpEF 11/2022 echo LVEF 60-65%  CAD CABG 2020 prior subtotal PDA felt due to spasm, DESx3 RCA 2021, cath 12/2020 patent grafts, medical management.  HTN  HLD  CKD stage 4  DM          {Are you ordering a CV Procedure (e.g. stress test, cath, DCCV, TEE, etc)?   Press F2        :[id number]}   Dispo:  No follow-ups on file.   Medication Adjustments/Labs and Tests Ordered: Current medicines are reviewed at length with the patient today.  Concerns regarding medicines are outlined above.  Tests Ordered: No orders of the defined types were placed in this encounter.  Medication Changes: No orders of the defined types were placed in this encounter.  Elson Clan, PA-C  02/23/2023 8:42 AM    [facility] [address] [place], [place], [place]  [address] Phone: [phone]; Fax: [phone]

## 2023-02-24 ENCOUNTER — Ambulatory Visit: Payer: Medicare Other | Attending: Internal Medicine | Admitting: Internal Medicine

## 2023-02-24 ENCOUNTER — Telehealth: Payer: Self-pay | Admitting: Pharmacy Technician

## 2023-02-24 ENCOUNTER — Encounter: Payer: Self-pay | Admitting: Internal Medicine

## 2023-02-24 ENCOUNTER — Telehealth (HOSPITAL_COMMUNITY): Payer: Self-pay | Admitting: Vascular Surgery

## 2023-02-24 ENCOUNTER — Telehealth: Payer: Self-pay

## 2023-02-24 VITALS — BP 126/60 | HR 88 | Ht 63.0 in | Wt 146.4 lb

## 2023-02-24 DIAGNOSIS — I5031 Acute diastolic (congestive) heart failure: Secondary | ICD-10-CM

## 2023-02-24 MED ORDER — TORSEMIDE 20 MG PO TABS: 60.0000 mg | ORAL_TABLET | Freq: Two times a day (BID) | ORAL | 3 refills | Status: DC

## 2023-02-24 MED ORDER — METOLAZONE 5 MG PO TABS: 5.0000 mg | ORAL_TABLET | ORAL | 3 refills | Status: DC

## 2023-02-24 NOTE — Telephone Encounter (Signed)
Called patient to schedule first appointment for Retacrit injection. No answer. Left HIPAA-compliant voicemail requesting call back. Will also send MyChart message.  Wyvonne Lenz, RN

## 2023-02-24 NOTE — Telephone Encounter (Signed)
Lvm to make new chf appt ?

## 2023-02-24 NOTE — Progress Notes (Signed)
Cardiology Office Note  Date: 02/24/2023   ID: Meghan Terry, DOB 1961-04-05, MRN [id number]  PCP:  Benita Stabile, MD  Cardiologist:  Marjo Bicker, MD Electrophysiologist:  None   Reason for Office Visit: Follow-up of CAD   History of Present Illness: Meghan Terry is a 62 y.o. female known to have CAD s/p 3v CABG (LIMA to LAD, SVG to OM and SVG to PDA) with normal LVEF, HTN, DM 2, HLD, CKD stage IIIa is here for follow-up visit.  Patient underwent LHC in 2021 and 2022 that showed patent LIMA to LAD, patent graft to the PDA and patent graft to the OM.  Native multivessel CAD is unchanged compared to 2021 LHC.  She does have severe diffusely diseased LAD from proximal to mid after which competitive flow is noted in the mid LAD to LIMA graft insertion, CTO of LCx with very faint collaterals noted and heavy burden of stents in RCA from the mid vessel into the continuation beyond the origin of the PDA, the PDA is jailed no significant obstruction is noted in the RCA other than the PDA which was supplied by the graft. Patient is here for visit. Daughter is the power of attorney. Patient was admitted with ADHF in 12/2022 and 01/2023. She was discharged a few days ago and started to have recurrent symptoms of DOE and b/l leg swelling.  She is currently on torsemide 40 mg twice daily.  She is also on metoprolol succinate 12.5 mg at bedtime and ranolazine 1000 mg twice daily for antianginal therapy. She continues to have angina 3-4 times per week and said she deals with it.  No dizziness or syncope.  Past Medical History:  Diagnosis Date   Acute diastolic CHF (congestive heart failure) (HCC) 06/08/2021   Acute pulmonary edema (HCC)    Anemia due to stage 4 chronic kidney disease (HCC) 07/20/2022   Angina pectoris (HCC) 11/17/2019   Anxiety    Atypical chest pain 12/30/2020   Benign hypertension with CKD (chronic kidney disease) stage IV (HCC) 07/20/2022   CHF (congestive heart failure) (HCC)  06/08/2021   CKD (chronic kidney disease) stage 3b, GFR 30-59 ml/min 12/03/2019   Coronary artery disease    Coronary artery disease involving native coronary artery of native heart with angina pectoris (HCC) 10/03/2019   Depression 04/25/2017   Diabetes mellitus due to underlying condition with unspecified complications (HCC) 09/05/2019   Diabetes mellitus with stage 4 chronic kidney disease GFR 15-29 (HCC) 07/20/2022   Essential hypertension 09/05/2019   Ex-smoker 09/05/2019   Family history of coronary artery disease 09/05/2019   History of depression 04/15/2020   Hx of diabetes mellitus 04/15/2020   Hyperlipidemia with target LDL less than 70 11/24/2019   Hyperphosphatemia 07/20/2022   Hypertension    Hypokalemia 04/30/2022   Hypothyroid    Migraine 04/25/2017   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   Syncope and collapse 04/29/2022   Thyroid disease    UTI (urinary tract infection) 04/30/2022    Past Surgical History:  Procedure Laterality Date   ABDOMINAL HYSTERECTOMY     APPENDECTOMY     BIOPSY  12/23/2022  Procedure: BIOPSY;  Surgeon: Corbin Ade, MD;  Location: [facility];  Service: Endoscopy;;   CARDIAC CATHETERIZATION  11/22/2019   CHOLECYSTECTOMY     COLONOSCOPY     COLONOSCOPY WITH PROPOFOL N/A 12/25/2022   Procedure: COLONOSCOPY WITH PROPOFOL;  Surgeon: Dolores Frame, MD;  Location: [facility];  Service: Gastroenterology;  Laterality: N/A;   CORONARY ARTERY BYPASS GRAFT N/A 09/15/2019   Procedure: CORONARY ARTERY BYPASS GRAFTING (CABG) times three on pump using left internal mammary artery and right and left greater saphenous veins  harvested endoscopically;  Surgeon: Alleen Borne, MD;  Location: [facility];  Service: Open Heart Surgery;  Laterality: N/A;   CORONARY STENT INTERVENTION N/A 11/23/2019   Procedure: CORONARY STENT INTERVENTION;  Surgeon: Lennette Bihari, MD;  Location: [facility];  Service: Cardiovascular;  Laterality: N/A;   ESOPHAGEAL DILATION N/A 12/23/2022   Procedure: ESOPHAGEAL DILATION;  Surgeon: Corbin Ade, MD;  Location: [facility];  Service: Endoscopy;  Laterality: N/A;   ESOPHAGOGASTRODUODENOSCOPY (EGD) WITH PROPOFOL N/A 12/23/2022   Procedure: ESOPHAGOGASTRODUODENOSCOPY (EGD) WITH PROPOFOL;  Surgeon: Corbin Ade, MD;  Location: [facility];  Service: Endoscopy;  Laterality: N/A;   KNEE ARTHROSCOPY     LEFT HEART CATH AND CORONARY ANGIOGRAPHY N/A 09/14/2019   Procedure: LEFT HEART CATH AND CORONARY ANGIOGRAPHY;  Surgeon: Corky Crafts, MD;  Location: [facility] CV LAB;  Service: Cardiovascular;  Laterality: N/A;   LEFT HEART CATH AND CORS/GRAFTS ANGIOGRAPHY N/A 11/22/2019   Procedure: LEFT HEART CATH AND CORS/GRAFTS ANGIOGRAPHY;  Surgeon: Lennette Bihari, MD;  Location: [facility];  Service: Cardiovascular;  Laterality: N/A;   LEFT HEART CATH AND CORS/GRAFTS ANGIOGRAPHY N/A 03/13/2020   Procedure: LEFT HEART CATH AND CORS/GRAFTS ANGIOGRAPHY;  Surgeon: Runell Gess, MD;  Location: [facility];  Service: Cardiovascular;  Laterality: N/A;   LEFT HEART CATH AND CORS/GRAFTS ANGIOGRAPHY N/A 12/31/2020   Procedure: LEFT HEART CATH AND CORS/GRAFTS ANGIOGRAPHY;  Surgeon: Lyn Records, MD;  Location: [facility];  Service: Cardiovascular;  Laterality: N/A;   OSTEOCHONDRAL DEFECT REPAIR/RECONSTRUCTION Left 11/29/2014   Procedure: LEFT ANKLE MEDIAL MALLEOLUS OSTEOTOMY,AUTO GRAFT FROM CALCANEOUS;  OS TIBIA DENORO GRAFTING TALUS;  Surgeon: Toni Arthurs, MD;  Location: [facility];  Service: Orthopedics;  Laterality: Left;   POLYPECTOMY  12/25/2022    Procedure: POLYPECTOMY;  Surgeon: Dolores Frame, MD;  Location: [facility];  Service: Gastroenterology;;   TEE WITHOUT CARDIOVERSION N/A 09/15/2019   Procedure: TRANSESOPHAGEAL ECHOCARDIOGRAM (TEE);  Surgeon: Alleen Borne, MD;  Location: [facility] OR;  Service: Open Heart Surgery;  Laterality: N/A;   TUBAL LIGATION      Current Outpatient Medications  Medication Sig Dispense Refill   acetaminophen (TYLENOL) 500 MG tablet Take 2 tablets by mouth 3 times daily as needed for pain 100 tablet 0   aspirin EC 81 MG tablet Take 1 tablet (81 mg total) by mouth daily. 90 tablet 3   buPROPion (WELLBUTRIN XL) 300 MG 24 hr tablet Take 1 tablet (300 mg total) by mouth daily. 90 tablet 2   cholecalciferol 25 MCG (1000 UT) tablet Take 1 tablet (1,000 Units total) by mouth in the morning and at bedtime.     clopidogrel (PLAVIX) 75 MG tablet TAKE 1 TABLET BY MOUTH DAILY (Patient taking differently: Take 75 mg by mouth daily.) 90 tablet 2   cyanocobalamin (VITAMIN B12) 1000 MCG tablet Take 1 tablet (1,000 mcg total) by  mouth daily. 30 tablet 1   dapagliflozin propanediol (FARXIGA) 10 MG TABS tablet Take 1 tablet (10 mg total) by mouth daily before breakfast. 30 tablet 8   ferrous sulfate 325 (65 FE) MG tablet Take 1 tablet (325 mg total) by mouth daily with breakfast. 60 tablet 1   folic acid (FOLVITE) 1 MG tablet Take 1 tablet (1 mg total) by mouth daily. 30 tablet 1   gabapentin (NEURONTIN) 100 MG capsule Take 3 capsules (300 mg total) by mouth 3 (three) times daily as needed. 270 capsule 5   levothyroxine (SYNTHROID) 50 MCG tablet Take 1 tablet (50 mcg total) by mouth See admin instructions. Take 1 tablet in the morning with breakfast on SATURDAY AND SUNDAY.     levothyroxine (SYNTHROID) 75 MCG tablet Take 1 tablet (75 mcg total) by mouth daily Monday thru Friday. 60 tablet 3   linaclotide (LINZESS) 145 MCG CAPS capsule Take 1 capsule (145 mcg total) by mouth daily before breakfast. 30 capsule 3    metolazone (ZAROXOLYN) 5 MG tablet Take 1 tablet (5 mg total) by mouth 3 (three) times a week. 12 tablet 3   metoprolol succinate (TOPROL-XL) 25 MG 24 hr tablet Take 0.5 tablets (12.5 mg total) by mouth at bedtime. Take with or immediately following a meal. 90 tablet 3   nitroGLYCERIN (NITROSTAT) 0.4 MG SL tablet Place 1 tablet (0.4 mg total) under the tongue every 5 (five) minutes as needed for chest pain. 25 tablet 7   ondansetron (ZOFRAN) 4 MG tablet take 1 tablet (4 mg) by mouth 2 times per day as needed for nausea (Patient taking differently: Take 4 mg by mouth 2 (two) times daily as needed for nausea or vomiting.) 180 tablet 1   pantoprazole (PROTONIX) 40 MG tablet Take 1 tablet (40 mg total) by mouth 2 (two) times daily. 60 tablet 1   Polyvinyl Alcohol-Povidone (REFRESH OP) Apply 1 drop to eye 4 (four) times daily.     potassium chloride SA (KLOR-CON M) 20 MEQ tablet Take 2 tablets (40 mEq total) by mouth daily. (Patient taking differently: Take 40 mEq by mouth 2 (two) times daily.) 180 tablet 3   promethazine (PHENERGAN) 25 MG tablet Take 1 tablet (25 mg total) by mouth every 6 (six) hours as needed for nausea or vomiting.     ranolazine (RANEXA) 1000 MG SR tablet Take 1 tablet (1,000 mg total) by mouth 2 (two) times daily. 180 tablet 3   rOPINIRole (REQUIP) 4 MG tablet Take 1 tablet (4 mg total) by mouth 1 to 3 hours before bedtime. 90 tablet 0   rosuvastatin (CRESTOR) 40 MG tablet Take 1 tablet (40 mg total) by mouth at bedtime.     topiramate (TOPAMAX) 100 MG tablet TAKE 1 TABLET BY MOUTH TWICE DAILY. 180 tablet 3   traMADol (ULTRAM) 50 MG tablet Take 1 tablet (50 mg total) by mouth daily as needed for moderate pain.     traZODone (DESYREL) 150 MG tablet Take 1 tablet (150 mg total) by mouth in the morning and at bedtime. 30 tablet 0   traZODone (DESYREL) 50 MG tablet Take 1 tablet (50 mg total) by mouth daily. 30 tablet 0   torsemide (DEMADEX) 20 MG tablet Take 3 tablets (60 mg total) by  mouth 2 (two) times daily. 540 tablet 3   No current facility-administered medications for this visit.   Allergies:  Abilify [aripiprazole], Ciprofloxacin, Carbidopa-levodopa, Prednisone, and Venlafaxine   Social History: The patient  reports that she quit  smoking about 3 years ago. Her smoking use included cigarettes. She has a 47.00 pack-year smoking history. She has been exposed to tobacco smoke. She has never used smokeless tobacco. She reports that she does not drink alcohol and does not use drugs.   Family History: The patient's family history includes Asthma in her mother; COPD in her mother; Congestive Heart Failure in her father; Diabetes in her mother; Macular degeneration in her mother.   ROS:  Please see the history of present illness. Otherwise, complete review of systems is positive for none.  All other systems are reviewed and negative.   Physical Exam: VS:  BP 126/60   Pulse 88   Ht 5\' 3"  (1.6 m)   Wt 146 lb 6.4 oz (66.4 kg)   SpO2 97%   BMI 25.93 kg/m , BMI Body mass index is 25.93 kg/m.  Wt Readings from Last 3 Encounters:  02/24/23 146 lb 6.4 oz (66.4 kg)  02/21/23 140 lb 1.4 oz (63.5 kg)  02/02/23 140 lb (63.5 kg)    General: Patient appears comfortable at rest. HEENT: Conjunctiva and lids normal, oropharynx clear with moist mucosa. Neck: Supple, no elevated JVP or carotid bruits, no thyromegaly. Lungs: Clear to auscultation, nonlabored breathing at rest. Cardiac: Regular rate and rhythm, no S3 or significant systolic murmur, no pericardial rub. Abdomen: Soft, nontender, no hepatomegaly, bowel sounds present, no guarding or rebound. Extremities: 3) pitting edema, distal pulses 2+. Skin: Warm and dry. Musculoskeletal: No kyphosis. Neuropsychiatric: Alert and oriented x3, affect grossly appropriate.  Recent Labwork: 04/30/2022: TSH 1.701 01/25/2023: ALT 11; AST 14; Magnesium 2.3 02/18/2023: B Natriuretic Peptide 56.0 02/19/2023: Hemoglobin 8.6; Platelets  242 02/21/2023: BUN 37; Creatinine, Ser 2.52; Potassium 3.7; Sodium 135     Component Value Date/Time   CHOL 115 01/27/2023 0336   CHOL 147 03/28/2021 0831   TRIG 193 (H) 01/27/2023 0336   HDL 56 01/27/2023 0336   HDL 71 03/28/2021 0831   CHOLHDL 2.1 01/27/2023 0336   VLDL 39 01/27/2023 0336   LDLCALC 20 01/27/2023 0336   LDLCALC 51 03/28/2021 0831    Other Studies Reviewed Today: Echocardiogram on 12/22/2022 LVEF 60 to 65% G2 DD RV systolic function is normal Normal PASP LA mildly dilated Mild MR No aortic valve lesions  Event monitor in 09/2022 Unremarkable  LHC in 12/2020 LIMA to LAD is patent. Bypass graft to circumflex is patent.  Distribution of native vessel after the insertion is very small.  The native vessel diameter is minuscule. Bypass graft to PDA is patent.  PDA is totally occluded at its ostium and is jailed by stent. Left main is widely patent. LAD is severely and diffusely diseased with 70 to 80% stenosis from proximal to mid.  Competitive flow is noted in the mid LAD due to LIMA graft insertion. Circumflex is totally occluded in the mid vessel.  Very faint collaterals are noted to small "threadlike vessels". Right coronary is widely patent and has a heavy burden of stents that extends from the mid vessel into the continuation beyond the origin of the PDA.  The PDA is jailed.  No significant obstruction is noted in the right coronary other than the PDA which is supplied by a graft. Hyperdynamic left ventricle.  EF greater than 70%.  LVEDP is normal.  Assessment and Plan: Patient is a 62 year old F known to have CAD s/p 3v CABG (LIMA to LAD, SVG to OM and SVG to PDA) with normal LVEF, HTN, DM 2, HLD, CKD stage IIIa  is here for follow-up visit.  # Acute on chronic diastolic heart failure exacerbation -Patient had 2 hospital admissions for ADHF in the last 2 months.  She was discharged a few days ago from the hospital, had a recurrence of DOE and bilateral lower  EXTR swelling.  Increase p.o. torsemide from 40 mg to 60 mg twice daily and added metolazone to 5 mg three times a week. Referral to heart failure clinic placed for volume optimization.  # CAD s/p 3v CABG (LIMA to LAD, SVG to OM and SVG to PDA) with normal LVEF, CCS II-III angina -Continue DAPT, aspirin 81 mg and Plavix 75 mg once daily -Continue rosuvastatin 40 mg nightly -Due to multiple lightheadedness episodes in the past, metoprolol dose was decreased from 25 mg to 12.5 mg once daily in 08/2022. She had unremarkable event monitor in 09/2022. Continue ranolazine 1000 mg twice daily.  # HLD: Continue rosuvastatin 40 mg nightly, goal LDL less than 55.   I have spent a total of 30 minutes with patient reviewing chart, EKGs, labs and examining patient as well as establishing an assessment and plan that was discussed with the patient.  > 50% of time was spent in direct patient care.    Medication Adjustments/Labs and Tests Ordered: Current medicines are reviewed at length with the patient today.  Concerns regarding medicines are outlined above.   Tests Ordered: Orders Placed This Encounter  Procedures   AMB referral to CHF clinic    Medication Changes: Meds ordered this encounter  Medications   metolazone (ZAROXOLYN) 5 MG tablet    Sig: Take 1 tablet (5 mg total) by mouth 3 (three) times a week.    Dispense:  12 tablet    Refill:  3    02/24/2023 NEW   torsemide (DEMADEX) 20 MG tablet    Sig: Take 3 tablets (60 mg total) by mouth 2 (two) times daily.    Dispense:  540 tablet    Refill:  3    02/24/2023 dose increase    Disposition:  Follow up  3 months  Signed, Maizey Menendez Verne Spurr, MD, 02/24/2023 12:38 PM    [facility] [address], [place], [place] [address]

## 2023-02-24 NOTE — Patient Instructions (Addendum)
Medication Instructions:  Your physician has recommended you make the following change in your medication:  Increase torsemide to 60 mg twice daily Start metolazone 5 mg three times per week (30 minutes before torsemide) Continue other medications the same  Labwork: none  Testing/Procedures: none  Follow-Up: Your physician recommends that you schedule a follow-up appointment in: 3 months  Any Other Special Instructions Will Be Listed Below (If Applicable). You have been referred to Heart Failure Clinic  If you need a refill on your cardiac medications before your next appointment, please call your pharmacy.

## 2023-02-24 NOTE — Telephone Encounter (Signed)
Auth Submission: NO AUTH NEEDED Site of care: Site of care: [facility] Payer: [employer] MEDICARE Medication & CPT/J Code(s) submitted:  Retacrit [id number] Route of submission (phone, fax, portal):  Phone # Fax # Auth type: Buy/Bill Units/visits requested: 4000 UNITS Q2WKS Reference number: [id number] Approval from: 02/24/23 to 09/28/23

## 2023-02-25 ENCOUNTER — Ambulatory Visit: Payer: Medicare Other | Admitting: Neurology

## 2023-02-25 DIAGNOSIS — R4189 Other symptoms and signs involving cognitive functions and awareness: Secondary | ICD-10-CM

## 2023-02-25 DIAGNOSIS — R410 Disorientation, unspecified: Secondary | ICD-10-CM

## 2023-02-25 DIAGNOSIS — Z87828 Personal history of other (healed) physical injury and trauma: Secondary | ICD-10-CM

## 2023-03-01 ENCOUNTER — Ambulatory Visit: Payer: Medicare Other | Admitting: Physician Assistant

## 2023-03-04 NOTE — Telephone Encounter (Signed)
LMOVM asking Meghan Terry to return call to the Heart Failure Clinic to set up appointment

## 2023-03-04 NOTE — Procedures (Signed)
    History:  62 year old woman with confusion and cognitive impairment   EEG classification: Awake and drowsy  Description of the recording: The background rhythms of this recording consists of a fairly well modulated medium amplitude delta-theta activity consistent with mild to moderate diffuse slowing.   Photic stimulation was performed, did not show any abnormalities. Hyperventilation was also performed, did not show any abnormalities. No abnormal epileptiform discharges seen during this recording. There was mild to moderate diffuse slowing. There were no electrographic seizure identified.   Abnormality: Mild to moderate diffuse slowing  Impression: This is an abnormal EEG recorded while drowsy and awake due to mild to moderate diffuse slowing. This is consistent with a generalized brain dysfunction such as encephalopathy, nonspecific.   Windell Norfolk, MD [facility]

## 2023-03-05 ENCOUNTER — Ambulatory Visit (HOSPITAL_COMMUNITY)
Admission: RE | Admit: 2023-03-05 | Discharge: 2023-03-05 | Disposition: A | Discharge status: Home / Self Care | Payer: Medicare Other | Source: Ambulatory Visit | Attending: Cardiology | Admitting: Cardiology

## 2023-03-05 ENCOUNTER — Encounter (HOSPITAL_COMMUNITY)
Admission: RE | Admit: 2023-03-05 | Discharge: 2023-03-05 | Disposition: A | Discharge status: Home / Self Care | Payer: Medicare Other | Source: Ambulatory Visit | Attending: Nephrology | Admitting: Nephrology

## 2023-03-05 ENCOUNTER — Encounter (HOSPITAL_COMMUNITY): Payer: Self-pay | Admitting: Cardiology

## 2023-03-05 VITALS — BP 107/59 | HR 90 | Temp 98.8°F | Resp 16

## 2023-03-05 VITALS — BP 100/60 | HR 91 | Wt 143.0 lb

## 2023-03-05 DIAGNOSIS — Z79899 Other long term (current) drug therapy: Secondary | ICD-10-CM | POA: Diagnosis not present

## 2023-03-05 DIAGNOSIS — Z5986 Financial insecurity: Secondary | ICD-10-CM | POA: Diagnosis not present

## 2023-03-05 DIAGNOSIS — Z7984 Long term (current) use of oral hypoglycemic drugs: Secondary | ICD-10-CM | POA: Insufficient documentation

## 2023-03-05 DIAGNOSIS — F419 Anxiety disorder, unspecified: Secondary | ICD-10-CM | POA: Diagnosis not present

## 2023-03-05 DIAGNOSIS — D631 Anemia in chronic kidney disease: Secondary | ICD-10-CM | POA: Insufficient documentation

## 2023-03-05 DIAGNOSIS — F32A Depression, unspecified: Secondary | ICD-10-CM | POA: Insufficient documentation

## 2023-03-05 DIAGNOSIS — I5032 Chronic diastolic (congestive) heart failure: Secondary | ICD-10-CM | POA: Insufficient documentation

## 2023-03-05 DIAGNOSIS — I25119 Atherosclerotic heart disease of native coronary artery with unspecified angina pectoris: Secondary | ICD-10-CM | POA: Diagnosis not present

## 2023-03-05 DIAGNOSIS — Z7982 Long term (current) use of aspirin: Secondary | ICD-10-CM | POA: Insufficient documentation

## 2023-03-05 DIAGNOSIS — N1832 Chronic kidney disease, stage 3b: Secondary | ICD-10-CM | POA: Diagnosis not present

## 2023-03-05 DIAGNOSIS — E039 Hypothyroidism, unspecified: Secondary | ICD-10-CM | POA: Diagnosis not present

## 2023-03-05 DIAGNOSIS — N184 Chronic kidney disease, stage 4 (severe): Secondary | ICD-10-CM | POA: Insufficient documentation

## 2023-03-05 DIAGNOSIS — Z7902 Long term (current) use of antithrombotics/antiplatelets: Secondary | ICD-10-CM | POA: Insufficient documentation

## 2023-03-05 DIAGNOSIS — Z951 Presence of aortocoronary bypass graft: Secondary | ICD-10-CM | POA: Diagnosis not present

## 2023-03-05 DIAGNOSIS — Z87891 Personal history of nicotine dependence: Secondary | ICD-10-CM | POA: Insufficient documentation

## 2023-03-05 DIAGNOSIS — E785 Hyperlipidemia, unspecified: Secondary | ICD-10-CM | POA: Diagnosis not present

## 2023-03-05 DIAGNOSIS — E1122 Type 2 diabetes mellitus with diabetic chronic kidney disease: Secondary | ICD-10-CM | POA: Insufficient documentation

## 2023-03-05 DIAGNOSIS — Z79890 Hormone replacement therapy (postmenopausal): Secondary | ICD-10-CM | POA: Insufficient documentation

## 2023-03-05 DIAGNOSIS — I130 Hypertensive heart and chronic kidney disease with heart failure and stage 1 through stage 4 chronic kidney disease, or unspecified chronic kidney disease: Secondary | ICD-10-CM | POA: Insufficient documentation

## 2023-03-05 DIAGNOSIS — G2581 Restless legs syndrome: Secondary | ICD-10-CM | POA: Diagnosis not present

## 2023-03-05 DIAGNOSIS — Z955 Presence of coronary angioplasty implant and graft: Secondary | ICD-10-CM | POA: Insufficient documentation

## 2023-03-05 LAB — BASIC METABOLIC PANEL
Anion gap: 14 (ref 5–15)
BUN: 50 mg/dL — ABNORMAL HIGH (ref 8–23)
CO2: 31 mmol/L (ref 22–32)
Calcium: 8.8 mg/dL — ABNORMAL LOW (ref 8.9–10.3)
Chloride: 90 mmol/L — ABNORMAL LOW (ref 98–111)
Creatinine, Ser: 3.35 mg/dL — ABNORMAL HIGH (ref 0.44–1.00)
GFR, Estimated: 15 mL/min — ABNORMAL LOW (ref >60)
Glucose, Bld: 112 mg/dL — ABNORMAL HIGH (ref 70–99)
Potassium: 3.8 mmol/L (ref 3.5–5.1)
Sodium: 135 mmol/L (ref 135–145)

## 2023-03-05 LAB — BRAIN NATRIURETIC PEPTIDE: B Natriuretic Peptide: 43.5 pg/mL (ref 0.0–100.0)

## 2023-03-05 LAB — POCT HEMOGLOBIN-HEMACUE: Hemoglobin: 10 g/dL — ABNORMAL LOW (ref 12.0–15.0)

## 2023-03-05 MED ORDER — EPOETIN ALFA-EPBX 4000 UNIT/ML IJ SOLN
4000.0000 [IU] | Freq: Once | INTRAMUSCULAR | Status: DC
  Filled 2023-03-05: qty 1

## 2023-03-05 NOTE — Progress Notes (Signed)
Diagnosis: Anemia in Chronic Kidney Disease  Provider:  Celso Amy MD  Procedure: Injection  Retacrit (epoetin alfa-epbx), Dose: 40000 Units, Site: subcutaneous, Number of injections: 1 Not Given HGB: 10  Post Care:  Not given HGB: 10  Discharge: Condition: Stable, Destination: Home . AVS Declined  Performed by:  Fritzi Mandes, RN

## 2023-03-05 NOTE — Progress Notes (Addendum)
Provided patient education on Furoscix using demo kits and Furoscix video, QR code provided on AVS for further viewing.  Medication Samples have been provided to the patient.  Drug name: Furoscix       Strength: 80 mg/60ml        Qty: 1  LOT: [id number]  Exp.Date: 05-28-24  Dosing instructions: take as instructed on your AVS  The patient has been instructed regarding the correct time, dose, and frequency of taking this medication, including desired effects and most common side effects.   Yazleen Molock M Sabina Beavers 2:58 PM 03/05/2023

## 2023-03-05 NOTE — Progress Notes (Signed)
ReDS Vest / Clip - 03/05/23 1500       ReDS Vest / Clip   Station Marker A    Ruler Value 28    ReDS Value Range Low volume    ReDS Actual Value 28

## 2023-03-05 NOTE — Addendum Note (Signed)
Encounter addended by: Kandra Nicolas, RN on: 03/05/2023 3:29 PM  Actions taken: Therapy plan modified

## 2023-03-05 NOTE — Progress Notes (Signed)
ADVANCED HEART FAILURE CLINIC NOTE  Referring Physician: Benita Stabile, MD  Primary Care: Benita Stabile, MD Primary Cardiologist: Luane School, MD  HPI: Meghan Terry is a 62 y.o. female with CAD status post CABG in 2020 with LIMA to LAD, SVG to OM and SVG to PDA, history of PCI x 3 to the RCA, hypertension, hyperlipidemia, type 2 diabetes, stage IV CKD, chronic anemia and heart failure with preserved ejection fraction presenting today to establish care.  She was recently admitted to the hospital after presenting to outpatient clinic visit with decompensated HFpEF.  She was diuresed 4.5 L with IV Lasix and discharged home.  Interval hx:  Despite compliance with medications she continue to struggle with fluid overload. [name] reports 3 hospitalizations this year for volume overload requiring IV diuretics. Today she once again has 2+ pitting edema to the knees. From a functional standpoint, she can perform ADLs independently but does have stable chronic angina.    Past Medical History:  Diagnosis Date   Acute diastolic CHF (congestive heart failure) (HCC) 06/08/2021   Acute pulmonary edema (HCC)    Anemia due to stage 4 chronic kidney disease (HCC) 07/20/2022   Angina pectoris (HCC) 11/17/2019   Anxiety    Atypical chest pain 12/30/2020   Benign hypertension with CKD (chronic kidney disease) stage IV (HCC) 07/20/2022   CHF (congestive heart failure) (HCC) 06/08/2021   CKD (chronic kidney disease) stage 3b, GFR 30-59 ml/min 12/03/2019   Coronary artery disease    Coronary artery disease involving native coronary artery of native heart with angina pectoris (HCC) 10/03/2019   Depression 04/25/2017   Diabetes mellitus due to underlying condition with unspecified complications (HCC) 09/05/2019   Diabetes mellitus with stage 4 chronic kidney disease GFR 15-29 (HCC) 07/20/2022   Essential hypertension 09/05/2019   Ex-smoker 09/05/2019   Family history of coronary artery disease  09/05/2019   History of depression 04/15/2020   Hx of diabetes mellitus 04/15/2020   Hyperlipidemia with target LDL less than 70 11/24/2019   Hyperphosphatemia 07/20/2022   Hypertension    Hypokalemia 04/30/2022   Hypothyroid    Migraine 04/25/2017   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   Syncope and collapse 04/29/2022   Thyroid disease    UTI (urinary tract infection) 04/30/2022    Current Outpatient Medications  Medication Sig Dispense Refill   acetaminophen (TYLENOL) 500 MG tablet Take 2 tablets by mouth 3 times daily as needed for pain 100 tablet 0   aspirin EC 81 MG tablet Take 1 tablet (81 mg total) by mouth daily. 90 tablet 3   buPROPion (WELLBUTRIN XL) 300 MG 24 hr tablet Take 1 tablet (300 mg total) by mouth daily. 90 tablet 2   cholecalciferol 25 MCG (1000 UT) tablet Take 1 tablet (1,000 Units total) by mouth in the morning and at bedtime.     clopidogrel (PLAVIX) 75 MG tablet TAKE 1 TABLET BY MOUTH DAILY 90 tablet 2   cyanocobalamin (VITAMIN B12) 1000 MCG tablet Take 1 tablet (1,000 mcg total) by mouth daily. 30 tablet 1   dapagliflozin propanediol (FARXIGA) 10 MG TABS tablet Take 1 tablet (10 mg total)  by mouth daily before breakfast. 30 tablet 8   ferrous sulfate 325 (65 FE) MG EC tablet Take 325 mg by mouth 2 (two) times daily.     folic acid (FOLVITE) 1 MG tablet Take 1 tablet (1 mg total) by mouth daily. 30 tablet 1   gabapentin (NEURONTIN) 100 MG capsule Take 3 capsules (300 mg total) by mouth 3 (three) times daily as needed. 270 capsule 5   levothyroxine (SYNTHROID) 50 MCG tablet Take 1 tablet (50 mcg total) by mouth See admin instructions. Take 1 tablet in  the morning with breakfast on SATURDAY AND SUNDAY.     levothyroxine (SYNTHROID) 75 MCG tablet Take 1 tablet (75 mcg total) by mouth daily Monday thru Friday. 60 tablet 3   linaclotide (LINZESS) 145 MCG CAPS capsule Take 1 capsule (145 mcg total) by mouth daily before breakfast. 30 capsule 3   metolazone (ZAROXOLYN) 5 MG tablet Take 1 tablet (5 mg total) by mouth 3 (three) times a week. 12 tablet 3   metoprolol succinate (TOPROL-XL) 25 MG 24 hr tablet Take 0.5 tablets (12.5 mg total) by mouth at bedtime. Take with or immediately following a meal. 90 tablet 3   nitroGLYCERIN (NITROSTAT) 0.4 MG SL tablet Place 1 tablet (0.4 mg total) under the tongue every 5 (five) minutes as needed for chest pain. 25 tablet 7   ondansetron (ZOFRAN) 4 MG tablet take 1 tablet (4 mg) by mouth 2 times per day as needed for nausea 180 tablet 1   pantoprazole (PROTONIX) 40 MG tablet Take 1 tablet (40 mg total) by mouth 2 (two) times daily. 60 tablet 1   Polyvinyl Alcohol-Povidone (REFRESH OP) Apply 1 drop to eye 4 (four) times daily.     Potassium Chloride ER 20 MEQ TBCR Take 40 mEq by mouth 2 (two) times daily.     promethazine (PHENERGAN) 25 MG tablet Take 1 tablet (25 mg total) by mouth every 6 (six) hours as needed for nausea or vomiting.     ranolazine (RANEXA) 1000 MG SR tablet Take 1 tablet (1,000 mg total) by mouth 2 (two) times daily. 180 tablet 3   rOPINIRole (REQUIP) 4 MG tablet Take 1 tablet (4 mg total) by mouth 1 to 3 hours before bedtime. 90 tablet 0   rosuvastatin (CRESTOR) 40 MG tablet Take 1 tablet (40 mg total) by mouth at bedtime.     topiramate (TOPAMAX) 100 MG tablet TAKE 1 TABLET BY MOUTH TWICE DAILY. 180 tablet 3   torsemide (DEMADEX) 20 MG tablet Take 3 tablets (60 mg total) by mouth 2 (two) times daily. 540 tablet 3   traMADol (ULTRAM) 50 MG tablet Take 1 tablet (50 mg total) by mouth daily as needed for moderate pain.     traZODone (DESYREL) 100 MG tablet Take 150 mg by mouth at bedtime.     No  current facility-administered medications for this encounter.   Facility-Administered Medications Ordered in Other Encounters  Medication Dose Route Frequency Provider Last Rate Last Admin   epoetin alfa-epbx (RETACRIT) injection 4,000 Units  4,000 Units Subcutaneous Once Bhutani, Manpreet S, MD        Allergies  Allergen Reactions   Abilify [Aripiprazole]    Ciprofloxacin Nausea And Vomiting   Carbidopa-Levodopa Other (See Comments)    Developed tics while taking    Prednisone Other (See Comments)    Turns red all over    Venlafaxine Other (See Comments)    Developed tics while taking - reaction to Effexor  Social History   Socioeconomic History   Marital status: Married    Spouse name: Viktoriya Glaspy   Number of children: 2   Years of education: Not on file   Highest education level: Associate degree: occupational, Scientist, product/process development, or vocational program  Occupational History   Occupation: disabilty    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  Tobacco Use   Smoking status: Former    Packs/day: 1.00    Years: 47.00    Additional pack years: 0.00    Total pack years: 47.00    Types: Cigarettes    Quit date: 09/15/2019    Years since quitting: 3.4    Passive exposure: Past   Smokeless tobacco: Never  Vaping Use   Vaping Use: Every day   Start date: 09/15/2019   Substances: Nicotine  Substance and Sexual Activity   Alcohol use: No   Drug use: No   Sexual activity: Not on file  Other Topics Concern   Not on file  Social History Narrative   Not on file   Social Determinants of Health   Financial Resource Strain: High Risk (06/19/2021)   Overall Financial Resource Strain (CARDIA)    Difficulty of Paying Living Expenses: Very hard  Food Insecurity: No Food Insecurity (01/25/2023)   Hunger Vital Sign    Worried About Running Out of Food in the Last Year: Never true    Ran Out of Food in the Last Year: Never true  Transportation Needs: No Transportation Needs  (01/25/2023)   PRAPARE - Administrator, Civil Service (Medical): No    Lack of Transportation (Non-Medical): No  Physical Activity: Not on file  Stress: Not on file  Social Connections: Not on file  Intimate Partner Violence: Not At Risk (01/25/2023)   Humiliation, Afraid, Rape, and Kick questionnaire    Fear of Current or Ex-Partner: No    Emotionally Abused: No    Physically Abused: No    Sexually Abused: No      Family History  Problem Relation Age of Onset   Asthma Mother    COPD Mother    Diabetes Mother    Macular degeneration Mother    Congestive Heart Failure Father     PHYSICAL EXAM: Vitals:   03/05/23 1423  BP: 100/60  Pulse: 91  SpO2: 97%   GENERAL: chronically ill appearing WF HEENT: Negative for arcus senilis or xanthelasma. There is no scleral icterus.  The mucous membranes are pink and moist.   NECK: Supple, No masses. Normal carotid upstrokes without bruits. No masses or thyromegaly.    CHEST: There are no chest wall deformities. There is no chest wall tenderness. Respirations are unlabored.  Lungs- CTA B/L CARDIAC:  JVP: 9 cm H2O         Normal S1, S2  Normal rate with regular rhythm. No murmurs, rubs or gallops.  Pulses are 2+ and symmetrical in upper and lower extremities. 2-3+ pitting edema.  ABDOMEN: Soft, non-tender, non-distended. There are no masses or hepatomegaly. There are normal bowel sounds.  EXTREMITIES: Warm and well perfused with no cyanosis, clubbing.  LYMPHATIC: No axillary or supraclavicular lymphadenopathy.  NEUROLOGIC: Patient is oriented x3 with no focal or lateralizing neurologic deficits.  PSYCH: Patients affect is appropriate, there is no evidence of anxiety or depression.  SKIN: Warm and dry; no lesions or wounds.   DATA REVIEW  ECG: 02/18/23: NSR  As per my personal interpretation  ECHO: 12/22/22: LVEF 60-65%, grade II DD; normal RV function.  CATH: 01/18/23: Overall, no significant change when compared to  angiography performed in June 2021. LIMA to LAD is patent. Bypass graft to circumflex is patent.  Distribution of native vessel after the insertion is very small.  The native vessel diameter is minuscule. Bypass graft to PDA is patent.  PDA is totally occluded at its ostium and is jailed by stent. Left main is widely patent. LAD is severely and diffusely diseased with 70 to 80% stenosis from proximal to mid.  Competitive flow is noted in the mid LAD due to LIMA graft insertion. Circumflex is totally occluded in the mid vessel.  Very faint collaterals are noted to small "threadlike vessels". Right coronary is widely patent and has a heavy burden of stents that extends from the mid vessel into the continuation beyond the origin of the PDA.  The PDA is jailed.  No significant obstruction is noted in the right coronary other than the PDA which is supplied by a graft. Hyperdynamic left ventricle.  EF greater than 70%.  LVEDP is normal.   ASSESSMENT & PLAN:  Heart failure with preserved EF - Recurrent hospital admissions for HFpEF - Currently on low dose metoprolol for angina; will continue.  - continue farxiga 10mg  daily  - Will hold off on spironolactone due to fluctuating sCr over the past several lab checks and CKD IV; can start in the future.  - Taking torsemide 60mg  daily.  - Will give furoscix x 1 for tomorrow; torsemide 80mg  BID. Repeat labs today - Unfortunately at this time medical therapy is limited for her; we can try addition of ARB / MRA in the near future for CKD & HFpEF. If she has recurrent HFpEF admissions can consider Cardiomems.   2. CAD s/p CABG - 3V CABG w/ LIMA to LAD, SVG-OM, SVG-PDA - Ranexa 1000mg  BID and metoprolol succinate 12.5mg  qhs.  - Continue follow up with general cardiology   3. Hyperlipidemia - rosuvastatin 40mg  nightly   Meghan Terry [facility] Mechanical Circulatory Support

## 2023-03-05 NOTE — Patient Instructions (Addendum)
Your provider has order Furoscix for you. This is an on-body infuser that gives you a dose of Furosemide.   Ensure you write down the time you start your infusion so that if there is a problem you will know how long the infusion lasted  Use Furoscix only AS DIRECTED by our office  Dosing Directions:   Day 1= 03/05/23 use one kit  HOLD TORSEMIDE TILL MONDAY 03/08/23.  Labs done today, your results will be available in MyChart, we will contact you for abnormal readings.   Your physician recommends that you schedule a follow-up appointment in: 4 MONTHS ( October) ** please call the office in August to arrange your follow up appointment. **  If you have any questions or concerns before your next appointment please send [place] a message through Evan or call our office at [phone].    TO LEAVE A MESSAGE FOR THE NURSE SELECT OPTION 2, PLEASE LEAVE A MESSAGE INCLUDING: YOUR NAME DATE OF BIRTH CALL BACK NUMBER REASON FOR CALL**this is important as we prioritize the call backs  YOU WILL RECEIVE A CALL BACK THE SAME DAY AS LONG AS YOU CALL BEFORE 4:00 PM  At the [facility], you and your health needs are our priority. As part of our continuing mission to provide you with exceptional heart care, we have created designated Provider Care Teams. These Care Teams include your primary Cardiologist (physician) and Advanced Practice Providers (APPs- Physician Assistants and Nurse Practitioners) who all work together to provide you with the care you need, when you need it.   You may see any of the following providers on your designated Care Team at your next follow up: Dr Arvilla Meres Dr Marca Ancona Dr. Marcos Eke, NP Robbie Lis, [place] [facility] [place], [place] Brynda Peon, NP Karle Plumber, PharmD   Please be sure to bring in all your medications bottles to every appointment.    Thank you for choosing [facility]

## 2023-03-08 ENCOUNTER — Telehealth (HOSPITAL_COMMUNITY): Payer: Self-pay

## 2023-03-08 DIAGNOSIS — I5032 Chronic diastolic (congestive) heart failure: Secondary | ICD-10-CM

## 2023-03-08 NOTE — Telephone Encounter (Addendum)
Pt aware, agreeable, and verbalized understanding   ----- Message from [name], DO sent at 03/08/2023  3:30 PM EDT ----- Can we bring Ms. Kelso in for repeat BMP/BNP later this week due to worsening kidney function  Meghan Terry

## 2023-03-09 ENCOUNTER — Telehealth: Payer: Self-pay

## 2023-03-09 NOTE — Telephone Encounter (Signed)
Pt had Called and relayed message to Pt. Pt verbalized understanding.no questions at this time but was encouraged to call back if questions arise.

## 2023-03-09 NOTE — Telephone Encounter (Signed)
-----   Message from Asa Lente, MD sent at 03/08/2023  4:53 PM EDT ----- Please let her know that the EEG did not show any seizure activity.  It did show some mild slowing which is often seen when people have some memory issues.

## 2023-03-12 ENCOUNTER — Other Ambulatory Visit (HOSPITAL_COMMUNITY)
Admission: RE | Admit: 2023-03-12 | Discharge: 2023-03-12 | Disposition: A | Discharge status: Home / Self Care | Payer: Medicare Other | Source: Ambulatory Visit | Attending: Nephrology | Admitting: Nephrology

## 2023-03-12 ENCOUNTER — Ambulatory Visit (HOSPITAL_COMMUNITY)
Admission: RE | Admit: 2023-03-12 | Discharge: 2023-03-12 | Disposition: A | Discharge status: Home / Self Care | Payer: Medicare Other | Source: Ambulatory Visit | Attending: Nephrology | Admitting: Nephrology

## 2023-03-12 ENCOUNTER — Other Ambulatory Visit (HOSPITAL_COMMUNITY): Payer: Self-pay | Admitting: Nephrology

## 2023-03-12 DIAGNOSIS — N170 Acute kidney failure with tubular necrosis: Secondary | ICD-10-CM | POA: Diagnosis not present

## 2023-03-12 DIAGNOSIS — I5032 Chronic diastolic (congestive) heart failure: Secondary | ICD-10-CM | POA: Insufficient documentation

## 2023-03-12 DIAGNOSIS — N184 Chronic kidney disease, stage 4 (severe): Secondary | ICD-10-CM | POA: Diagnosis present

## 2023-03-12 LAB — URINALYSIS, W/ REFLEX TO CULTURE (INFECTION SUSPECTED)
Bilirubin Urine: NEGATIVE
Glucose, UA: 50 mg/dL — AB
Hgb urine dipstick: NEGATIVE
Ketones, ur: NEGATIVE mg/dL
Nitrite: NEGATIVE
Protein, ur: NEGATIVE mg/dL
Specific Gravity, Urine: 1.004 — ABNORMAL LOW (ref 1.005–1.030)
pH: 7 (ref 5.0–8.0)

## 2023-03-12 LAB — BASIC METABOLIC PANEL
Anion gap: 11 (ref 5–15)
BUN: 39 mg/dL — ABNORMAL HIGH (ref 8–23)
CO2: 27 mmol/L (ref 22–32)
Calcium: 8.6 mg/dL — ABNORMAL LOW (ref 8.9–10.3)
Chloride: 96 mmol/L — ABNORMAL LOW (ref 98–111)
Creatinine, Ser: 2.6 mg/dL — ABNORMAL HIGH (ref 0.44–1.00)
GFR, Estimated: 20 mL/min — ABNORMAL LOW (ref >60)
Glucose, Bld: 154 mg/dL — ABNORMAL HIGH (ref 70–99)
Potassium: 4 mmol/L (ref 3.5–5.1)
Sodium: 134 mmol/L — ABNORMAL LOW (ref 135–145)

## 2023-03-12 LAB — BRAIN NATRIURETIC PEPTIDE: B Natriuretic Peptide: 118 pg/mL — ABNORMAL HIGH (ref 0.0–100.0)

## 2023-03-17 ENCOUNTER — Other Ambulatory Visit: Payer: Self-pay

## 2023-03-17 ENCOUNTER — Emergency Department (HOSPITAL_COMMUNITY): Payer: Medicare Other

## 2023-03-17 ENCOUNTER — Inpatient Hospital Stay (HOSPITAL_COMMUNITY)
Admission: EM | Admit: 2023-03-17 | Discharge: 2023-03-20 | DRG: 291 | Disposition: A | Discharge status: Home / Self Care | Payer: Medicare Other | Attending: Internal Medicine | Admitting: Internal Medicine

## 2023-03-17 ENCOUNTER — Encounter (HOSPITAL_COMMUNITY): Payer: Self-pay | Admitting: Emergency Medicine

## 2023-03-17 DIAGNOSIS — E039 Hypothyroidism, unspecified: Secondary | ICD-10-CM | POA: Diagnosis present

## 2023-03-17 DIAGNOSIS — D631 Anemia in chronic kidney disease: Secondary | ICD-10-CM | POA: Diagnosis present

## 2023-03-17 DIAGNOSIS — Z90710 Acquired absence of both cervix and uterus: Secondary | ICD-10-CM

## 2023-03-17 DIAGNOSIS — Z7982 Long term (current) use of aspirin: Secondary | ICD-10-CM

## 2023-03-17 DIAGNOSIS — I5033 Acute on chronic diastolic (congestive) heart failure: Secondary | ICD-10-CM | POA: Diagnosis not present

## 2023-03-17 DIAGNOSIS — I5032 Chronic diastolic (congestive) heart failure: Secondary | ICD-10-CM

## 2023-03-17 DIAGNOSIS — I2510 Atherosclerotic heart disease of native coronary artery without angina pectoris: Secondary | ICD-10-CM | POA: Diagnosis present

## 2023-03-17 DIAGNOSIS — F17290 Nicotine dependence, other tobacco product, uncomplicated: Secondary | ICD-10-CM | POA: Diagnosis present

## 2023-03-17 DIAGNOSIS — N184 Chronic kidney disease, stage 4 (severe): Secondary | ICD-10-CM | POA: Diagnosis not present

## 2023-03-17 DIAGNOSIS — E785 Hyperlipidemia, unspecified: Secondary | ICD-10-CM | POA: Diagnosis present

## 2023-03-17 DIAGNOSIS — Z5986 Financial insecurity: Secondary | ICD-10-CM

## 2023-03-17 DIAGNOSIS — E1122 Type 2 diabetes mellitus with diabetic chronic kidney disease: Secondary | ICD-10-CM | POA: Diagnosis present

## 2023-03-17 DIAGNOSIS — J9811 Atelectasis: Secondary | ICD-10-CM | POA: Diagnosis present

## 2023-03-17 DIAGNOSIS — I130 Hypertensive heart and chronic kidney disease with heart failure and stage 1 through stage 4 chronic kidney disease, or unspecified chronic kidney disease: Secondary | ICD-10-CM | POA: Diagnosis present

## 2023-03-17 DIAGNOSIS — I5031 Acute diastolic (congestive) heart failure: Secondary | ICD-10-CM | POA: Diagnosis present

## 2023-03-17 DIAGNOSIS — Z955 Presence of coronary angioplasty implant and graft: Secondary | ICD-10-CM

## 2023-03-17 DIAGNOSIS — D649 Anemia, unspecified: Secondary | ICD-10-CM | POA: Diagnosis present

## 2023-03-17 DIAGNOSIS — R601 Generalized edema: Secondary | ICD-10-CM | POA: Diagnosis not present

## 2023-03-17 DIAGNOSIS — E1121 Type 2 diabetes mellitus with diabetic nephropathy: Secondary | ICD-10-CM | POA: Diagnosis not present

## 2023-03-17 DIAGNOSIS — D638 Anemia in other chronic diseases classified elsewhere: Secondary | ICD-10-CM | POA: Diagnosis present

## 2023-03-17 DIAGNOSIS — F419 Anxiety disorder, unspecified: Secondary | ICD-10-CM | POA: Diagnosis present

## 2023-03-17 DIAGNOSIS — Z79890 Hormone replacement therapy (postmenopausal): Secondary | ICD-10-CM

## 2023-03-17 DIAGNOSIS — Z8249 Family history of ischemic heart disease and other diseases of the circulatory system: Secondary | ICD-10-CM

## 2023-03-17 DIAGNOSIS — G2581 Restless legs syndrome: Secondary | ICD-10-CM | POA: Diagnosis present

## 2023-03-17 DIAGNOSIS — E876 Hypokalemia: Secondary | ICD-10-CM | POA: Diagnosis present

## 2023-03-17 DIAGNOSIS — N179 Acute kidney failure, unspecified: Secondary | ICD-10-CM | POA: Diagnosis present

## 2023-03-17 DIAGNOSIS — Z66 Do not resuscitate: Secondary | ICD-10-CM | POA: Diagnosis present

## 2023-03-17 DIAGNOSIS — I10 Essential (primary) hypertension: Secondary | ICD-10-CM | POA: Diagnosis not present

## 2023-03-17 DIAGNOSIS — J449 Chronic obstructive pulmonary disease, unspecified: Secondary | ICD-10-CM | POA: Diagnosis present

## 2023-03-17 DIAGNOSIS — G43909 Migraine, unspecified, not intractable, without status migrainosus: Secondary | ICD-10-CM | POA: Diagnosis present

## 2023-03-17 DIAGNOSIS — Z951 Presence of aortocoronary bypass graft: Secondary | ICD-10-CM

## 2023-03-17 DIAGNOSIS — I2781 Cor pulmonale (chronic): Secondary | ICD-10-CM | POA: Diagnosis present

## 2023-03-17 DIAGNOSIS — Z888 Allergy status to other drugs, medicaments and biological substances status: Secondary | ICD-10-CM

## 2023-03-17 DIAGNOSIS — Z1152 Encounter for screening for COVID-19: Secondary | ICD-10-CM | POA: Diagnosis not present

## 2023-03-17 DIAGNOSIS — Z7902 Long term (current) use of antithrombotics/antiplatelets: Secondary | ICD-10-CM

## 2023-03-17 DIAGNOSIS — F32A Depression, unspecified: Secondary | ICD-10-CM | POA: Diagnosis present

## 2023-03-17 DIAGNOSIS — Z79899 Other long term (current) drug therapy: Secondary | ICD-10-CM

## 2023-03-17 DIAGNOSIS — M7989 Other specified soft tissue disorders: Secondary | ICD-10-CM | POA: Diagnosis present

## 2023-03-17 DIAGNOSIS — I509 Heart failure, unspecified: Secondary | ICD-10-CM | POA: Diagnosis present

## 2023-03-17 DIAGNOSIS — Z825 Family history of asthma and other chronic lower respiratory diseases: Secondary | ICD-10-CM

## 2023-03-17 DIAGNOSIS — Z882 Allergy status to sulfonamides status: Secondary | ICD-10-CM

## 2023-03-17 DIAGNOSIS — Z833 Family history of diabetes mellitus: Secondary | ICD-10-CM

## 2023-03-17 LAB — TSH: TSH: 6.955 u[IU]/mL — ABNORMAL HIGH (ref 0.350–4.500)

## 2023-03-17 LAB — BASIC METABOLIC PANEL
Anion gap: 12 (ref 5–15)
BUN: 46 mg/dL — ABNORMAL HIGH (ref 8–23)
CO2: 31 mmol/L (ref 22–32)
Calcium: 8.7 mg/dL — ABNORMAL LOW (ref 8.9–10.3)
Chloride: 90 mmol/L — ABNORMAL LOW (ref 98–111)
Creatinine, Ser: 2.82 mg/dL — ABNORMAL HIGH (ref 0.44–1.00)
GFR, Estimated: 18 mL/min — ABNORMAL LOW (ref >60)
Glucose, Bld: 100 mg/dL — ABNORMAL HIGH (ref 70–99)
Potassium: 3.5 mmol/L (ref 3.5–5.1)
Sodium: 133 mmol/L — ABNORMAL LOW (ref 135–145)

## 2023-03-17 LAB — CBC
HCT: 32.7 % — ABNORMAL LOW (ref 36.0–46.0)
Hemoglobin: 10.2 g/dL — ABNORMAL LOW (ref 12.0–15.0)
MCH: 29.3 pg (ref 26.0–34.0)
MCHC: 31.2 g/dL (ref 30.0–36.0)
MCV: 94 fL (ref 80.0–100.0)
Platelets: 243 10*3/uL (ref 150–400)
RBC: 3.48 MIL/uL — ABNORMAL LOW (ref 3.87–5.11)
RDW: 13.5 % (ref 11.5–15.5)
WBC: 8.4 10*3/uL (ref 4.0–10.5)
nRBC: 0 % (ref 0.0–0.2)

## 2023-03-17 LAB — PROTIME-INR
INR: 1 (ref 0.8–1.2)
Prothrombin Time: 13.5 seconds (ref 11.4–15.2)

## 2023-03-17 LAB — GLUCOSE, CAPILLARY: Glucose-Capillary: 214 mg/dL — ABNORMAL HIGH (ref 70–99)

## 2023-03-17 LAB — BRAIN NATRIURETIC PEPTIDE: B Natriuretic Peptide: 105 pg/mL — ABNORMAL HIGH (ref 0.0–100.0)

## 2023-03-17 MED ORDER — TOPIRAMATE 100 MG PO TABS
100.0000 mg | ORAL_TABLET | Freq: Two times a day (BID) | ORAL | Status: DC
  Administered 2023-03-17 – 2023-03-20 (×6): 100 mg via ORAL
  Filled 2023-03-17 (×6): qty 1

## 2023-03-17 MED ORDER — FUROSEMIDE 10 MG/ML IJ SOLN
80.0000 mg | Freq: Once | INTRAMUSCULAR | Status: AC
  Administered 2023-03-17: 80 mg via INTRAVENOUS
  Filled 2023-03-17: qty 8

## 2023-03-17 MED ORDER — LEVOTHYROXINE SODIUM 50 MCG PO TABS
50.0000 ug | ORAL_TABLET | ORAL | Status: DC
  Administered 2023-03-20: 50 ug via ORAL
  Filled 2023-03-17: qty 1

## 2023-03-17 MED ORDER — METOPROLOL SUCCINATE ER 25 MG PO TB24
12.5000 mg | ORAL_TABLET | Freq: Every day | ORAL | Status: DC
  Filled 2023-03-17: qty 1

## 2023-03-17 MED ORDER — ROPINIROLE HCL 1 MG PO TABS
4.0000 mg | ORAL_TABLET | Freq: Every day | ORAL | Status: DC
  Administered 2023-03-17 – 2023-03-19 (×3): 4 mg via ORAL
  Filled 2023-03-17 (×3): qty 4

## 2023-03-17 MED ORDER — LINACLOTIDE 145 MCG PO CAPS
145.0000 ug | ORAL_CAPSULE | Freq: Every day | ORAL | Status: DC
  Administered 2023-03-18: 145 ug via ORAL
  Filled 2023-03-17 (×3): qty 1

## 2023-03-17 MED ORDER — POTASSIUM CHLORIDE ER 20 MEQ PO TBCR: 40.0000 meq | EXTENDED_RELEASE_TABLET | Freq: Two times a day (BID) | ORAL | Status: DC

## 2023-03-17 MED ORDER — SODIUM CHLORIDE 0.9% FLUSH: 3.0000 mL | INTRAVENOUS | Status: DC | PRN

## 2023-03-17 MED ORDER — RANOLAZINE ER 500 MG PO TB12
1000.0000 mg | ORAL_TABLET | Freq: Two times a day (BID) | ORAL | Status: DC
  Administered 2023-03-17 – 2023-03-20 (×6): 1000 mg via ORAL
  Filled 2023-03-17 (×6): qty 2

## 2023-03-17 MED ORDER — BUPROPION HCL ER (XL) 300 MG PO TB24
300.0000 mg | ORAL_TABLET | Freq: Every day | ORAL | Status: DC
  Administered 2023-03-18 – 2023-03-20 (×3): 300 mg via ORAL
  Filled 2023-03-17 (×3): qty 1

## 2023-03-17 MED ORDER — CLOPIDOGREL BISULFATE 75 MG PO TABS
75.0000 mg | ORAL_TABLET | Freq: Every day | ORAL | Status: DC
  Administered 2023-03-18 – 2023-03-20 (×3): 75 mg via ORAL
  Filled 2023-03-17 (×3): qty 1

## 2023-03-17 MED ORDER — TRAZODONE HCL 50 MG PO TABS
150.0000 mg | ORAL_TABLET | Freq: Every day | ORAL | Status: DC
  Administered 2023-03-17 – 2023-03-19 (×3): 150 mg via ORAL
  Filled 2023-03-17 (×3): qty 3

## 2023-03-17 MED ORDER — ONDANSETRON HCL 4 MG/2ML IJ SOLN: 4.0000 mg | Freq: Four times a day (QID) | INTRAMUSCULAR | Status: DC | PRN

## 2023-03-17 MED ORDER — POTASSIUM CHLORIDE CRYS ER 20 MEQ PO TBCR
40.0000 meq | EXTENDED_RELEASE_TABLET | Freq: Two times a day (BID) | ORAL | Status: DC
  Administered 2023-03-18 – 2023-03-20 (×5): 40 meq via ORAL
  Filled 2023-03-17 (×6): qty 2

## 2023-03-17 MED ORDER — HEPARIN SODIUM (PORCINE) 5000 UNIT/ML IJ SOLN
5000.0000 [IU] | Freq: Two times a day (BID) | INTRAMUSCULAR | Status: DC
  Administered 2023-03-17 – 2023-03-19 (×4): 5000 [IU] via SUBCUTANEOUS
  Filled 2023-03-17 (×5): qty 1

## 2023-03-17 MED ORDER — ACETAMINOPHEN 325 MG PO TABS
650.0000 mg | ORAL_TABLET | ORAL | Status: DC | PRN
  Administered 2023-03-18 – 2023-03-19 (×2): 650 mg via ORAL
  Filled 2023-03-17 (×3): qty 2

## 2023-03-17 MED ORDER — METOLAZONE 5 MG PO TABS
2.5000 mg | ORAL_TABLET | Freq: Every day | ORAL | Status: DC
  Administered 2023-03-18: 2.5 mg via ORAL
  Filled 2023-03-17 (×2): qty 1

## 2023-03-17 MED ORDER — ASPIRIN 81 MG PO TBEC
81.0000 mg | DELAYED_RELEASE_TABLET | Freq: Every day | ORAL | Status: DC
  Administered 2023-03-18 – 2023-03-20 (×3): 81 mg via ORAL
  Filled 2023-03-17 (×3): qty 1

## 2023-03-17 MED ORDER — ROSUVASTATIN CALCIUM 20 MG PO TABS
40.0000 mg | ORAL_TABLET | Freq: Every day | ORAL | Status: DC
  Administered 2023-03-17 – 2023-03-19 (×3): 40 mg via ORAL
  Filled 2023-03-17 (×3): qty 2

## 2023-03-17 MED ORDER — SODIUM CHLORIDE 0.9% FLUSH
3.0000 mL | Freq: Two times a day (BID) | INTRAVENOUS | Status: DC
  Administered 2023-03-17 – 2023-03-20 (×6): 3 mL via INTRAVENOUS

## 2023-03-17 MED ORDER — ONDANSETRON HCL 4 MG PO TABS: 4.0000 mg | ORAL_TABLET | Freq: Three times a day (TID) | ORAL | Status: DC | PRN

## 2023-03-17 MED ORDER — TRAMADOL HCL 50 MG PO TABS
50.0000 mg | ORAL_TABLET | Freq: Every day | ORAL | Status: DC | PRN
  Administered 2023-03-18: 50 mg via ORAL
  Filled 2023-03-17 (×2): qty 1

## 2023-03-17 MED ORDER — GABAPENTIN 300 MG PO CAPS
300.0000 mg | ORAL_CAPSULE | Freq: Three times a day (TID) | ORAL | Status: DC | PRN
  Administered 2023-03-17 – 2023-03-18 (×2): 300 mg via ORAL
  Filled 2023-03-17 (×2): qty 1

## 2023-03-17 MED ORDER — INSULIN ASPART 100 UNIT/ML IJ SOLN
0.0000 [IU] | Freq: Three times a day (TID) | INTRAMUSCULAR | Status: DC
  Administered 2023-03-18 – 2023-03-20 (×4): 2 [IU] via SUBCUTANEOUS

## 2023-03-17 MED ORDER — SODIUM CHLORIDE 0.9 % IV SOLN: 250.0000 mL | INTRAVENOUS | Status: DC | PRN

## 2023-03-17 MED ORDER — LEVOTHYROXINE SODIUM 75 MCG PO TABS
75.0000 ug | ORAL_TABLET | ORAL | Status: DC
  Administered 2023-03-18 – 2023-03-19 (×2): 75 ug via ORAL
  Filled 2023-03-17 (×2): qty 1

## 2023-03-17 MED ORDER — PANTOPRAZOLE SODIUM 40 MG PO TBEC
40.0000 mg | DELAYED_RELEASE_TABLET | Freq: Two times a day (BID) | ORAL | Status: DC
  Administered 2023-03-17 – 2023-03-20 (×6): 40 mg via ORAL
  Filled 2023-03-17 (×6): qty 1

## 2023-03-17 MED ORDER — ACETAMINOPHEN 325 MG PO TABS: 650.0000 mg | ORAL_TABLET | Freq: Four times a day (QID) | ORAL | Status: DC | PRN

## 2023-03-17 MED ORDER — FUROSEMIDE 10 MG/ML IJ SOLN
80.0000 mg | Freq: Two times a day (BID) | INTRAMUSCULAR | Status: DC
  Administered 2023-03-18 (×2): 80 mg via INTRAVENOUS
  Filled 2023-03-17 (×3): qty 8

## 2023-03-17 NOTE — ED Provider Notes (Signed)
[place] EMERGENCY DEPARTMENT AT [facility] Provider Note   CSN: [id number] Arrival date & time: 03/17/23  1643     History  Chief Complaint  Patient presents with   Facial Swelling    Meghan Terry is a 62 y.o. female.  Patient has congestive heart failure and kidney failure.  She has been swelling for couple weeks now her nephrologist increased her Lasix to 100 mg twice a day and he is followed today.  The swelling has gotten worse and she is added more weight on.  He recommend admission with IV Lasix  The history is provided by the patient and medical records. No language interpreter was used.  Weakness Severity:  Moderate Onset quality:  Sudden Timing:  Constant Progression:  Worsening Chronicity:  Recurrent Context: not alcohol use   Relieved by:  Nothing Worsened by:  Nothing Associated symptoms: no abdominal pain, no chest pain, no cough, no diarrhea, no frequency, no headaches and no seizures        Home Medications Prior to Admission medications   Medication Sig Start Date End Date Taking? Authorizing Provider  acetaminophen (TYLENOL) 500 MG tablet Take 2 tablets by mouth 3 times daily as needed for pain 12/08/21     aspirin EC 81 MG tablet Take 1 tablet (81 mg total) by mouth daily. 09/05/19   Revankar, Aundra Dubin, MD  buPROPion (WELLBUTRIN XL) 300 MG 24 hr tablet Take 1 tablet (300 mg total) by mouth daily. 07/06/22     cholecalciferol 25 MCG (1000 UT) tablet Take 1 tablet (1,000 Units total) by mouth in the morning and at bedtime. 12/25/22   Johnson, Clanford L, MD  clopidogrel (PLAVIX) 75 MG tablet TAKE 1 TABLET BY MOUTH DAILY 03/30/22   Revankar, Aundra Dubin, MD  cyanocobalamin (VITAMIN B12) 1000 MCG tablet Take 1 tablet (1,000 mcg total) by mouth daily. 12/25/22   Johnson, Clanford L, MD  dapagliflozin propanediol (FARXIGA) 10 MG TABS tablet Take 1 tablet (10 mg total) by mouth daily before breakfast. 12/26/22   Johnson, Clanford L, MD  ferrous sulfate 325 (65  FE) MG EC tablet Take 325 mg by mouth 2 (two) times daily.    [provider]  folic acid (FOLVITE) 1 MG tablet Take 1 tablet (1 mg total) by mouth daily. 12/26/22   Johnson, Clanford L, MD  gabapentin (NEURONTIN) 100 MG capsule Take 3 capsules (300 mg total) by mouth 3 (three) times daily as needed. 02/02/23   Levert Feinstein, MD  levothyroxine (SYNTHROID) 50 MCG tablet Take 1 tablet (50 mcg total) by mouth See admin instructions. Take 1 tablet in the morning with breakfast on SATURDAY AND SUNDAY. 02/21/23   Cleora Fleet, MD  levothyroxine (SYNTHROID) 75 MCG tablet Take 1 tablet (75 mcg total) by mouth daily Monday thru Friday. 06/08/22     linaclotide (LINZESS) 145 MCG CAPS capsule Take 1 capsule (145 mcg total) by mouth daily before breakfast. 01/21/23   Aida Raider, NP  metolazone (ZAROXOLYN) 5 MG tablet Take 1 tablet (5 mg total) by mouth 3 (three) times a week. 02/24/23   Mallipeddi, Vishnu P, MD  metoprolol succinate (TOPROL-XL) 25 MG 24 hr tablet Take 0.5 tablets (12.5 mg total) by mouth at bedtime. Take with or immediately following a meal. 12/25/22   Johnson, Clanford L, MD  nitroGLYCERIN (NITROSTAT) 0.4 MG SL tablet Place 1 tablet (0.4 mg total) under the tongue every 5 (five) minutes as needed for chest pain. 12/29/21   Revankar,  Aundra Dubin, MD  ondansetron (ZOFRAN) 4 MG tablet take 1 tablet (4 mg) by mouth 2 times per day as needed for nausea 05/12/22     pantoprazole (PROTONIX) 40 MG tablet Take 1 tablet (40 mg total) by mouth 2 (two) times daily. 12/25/22   Johnson, Clanford L, MD  Polyvinyl Alcohol-Povidone (REFRESH OP) Apply 1 drop to eye 4 (four) times daily.    [provider]  Potassium Chloride ER 20 MEQ TBCR Take 40 mEq by mouth 2 (two) times daily.    [provider]  promethazine (PHENERGAN) 25 MG tablet Take 1 tablet (25 mg total) by mouth every 6 (six) hours as needed for nausea or vomiting. 12/25/22   Laural Benes, Clanford L, MD  ranolazine (RANEXA) 1000 MG SR  tablet Take 1 tablet (1,000 mg total) by mouth 2 (two) times daily. 02/04/23   Mallipeddi, Vishnu P, MD  rOPINIRole (REQUIP) 4 MG tablet Take 1 tablet (4 mg total) by mouth 1 to 3 hours before bedtime. 06/29/22     rosuvastatin (CRESTOR) 40 MG tablet Take 1 tablet (40 mg total) by mouth at bedtime. 12/25/22   Johnson, Clanford L, MD  topiramate (TOPAMAX) 100 MG tablet TAKE 1 TABLET BY MOUTH TWICE DAILY. 05/12/22     torsemide (DEMADEX) 20 MG tablet Take 3 tablets (60 mg total) by mouth 2 (two) times daily. 02/24/23   Mallipeddi, Vishnu P, MD  traMADol (ULTRAM) 50 MG tablet Take 1 tablet (50 mg total) by mouth daily as needed for moderate pain. 02/21/23   Johnson, Clanford L, MD  traZODone (DESYREL) 100 MG tablet Take 150 mg by mouth at bedtime.    [provider]      Allergies    Abilify [aripiprazole], Ciprofloxacin, Carbidopa-levodopa, Prednisone, and Venlafaxine    Review of Systems   Review of Systems  Constitutional:  Negative for appetite change and fatigue.  HENT:  Negative for congestion, ear discharge and sinus pressure.   Eyes:  Negative for discharge.  Respiratory:  Negative for cough.   Cardiovascular:  Negative for chest pain.  Gastrointestinal:  Negative for abdominal pain and diarrhea.  Genitourinary:  Negative for frequency and hematuria.  Musculoskeletal:  Negative for back pain.  Skin:  Negative for rash.  Neurological:  Positive for weakness. Negative for seizures and headaches.  Psychiatric/Behavioral:  Negative for hallucinations.     Physical Exam Updated Vital Signs BP 121/60   Pulse 79   Temp 98.5 F (36.9 C) (Oral)   Resp 11   Ht 5\' 3"  (1.6 m)   Wt 68 kg   SpO2 100%   BMI 26.57 kg/m  Physical Exam Vitals and nursing note reviewed.  Constitutional:      Appearance: She is well-developed.  HENT:     Head: Normocephalic.     Nose: Nose normal.  Eyes:     General: No scleral icterus.    Conjunctiva/sclera: Conjunctivae normal.  Neck:      Thyroid: No thyromegaly.  Cardiovascular:     Rate and Rhythm: Normal rate and regular rhythm.     Heart sounds: No murmur heard.    No friction rub. No gallop.  Pulmonary:     Breath sounds: No stridor. No wheezing or rales.  Chest:     Chest wall: No tenderness.  Abdominal:     General: There is no distension.     Tenderness: There is no abdominal tenderness. There is no rebound.  Musculoskeletal:  General: Normal range of motion.     Cervical back: Neck supple.     Comments: Leg edema  Lymphadenopathy:     Cervical: No cervical adenopathy.  Skin:    Findings: No erythema or rash.  Neurological:     Mental Status: She is alert and oriented to person, place, and time.     Motor: No abnormal muscle tone.     Coordination: Coordination normal.  Psychiatric:        Behavior: Behavior normal.     ED Results / Procedures / Treatments   Labs (all labs ordered are listed, but only abnormal results are displayed) Labs Reviewed  BASIC METABOLIC PANEL - Abnormal; Notable for the following components:      Result Value   Sodium 133 (*)    Chloride 90 (*)    Glucose, Bld 100 (*)    BUN 46 (*)    Creatinine, Ser 2.82 (*)    Calcium 8.7 (*)    GFR, Estimated 18 (*)    All other components within normal limits  CBC - Abnormal; Notable for the following components:   RBC 3.48 (*)    Hemoglobin 10.2 (*)    HCT 32.7 (*)    All other components within normal limits  BRAIN NATRIURETIC PEPTIDE - Abnormal; Notable for the following components:   B Natriuretic Peptide 105.0 (*)    All other components within normal limits  PROTIME-INR    EKG None  Radiology DG Chest 2 View  Result Date: 03/17/2023 CLINICAL DATA:  Weakness.  Chest pain EXAM: CHEST - 2 VIEW COMPARISON:  X-ray 02/18/2023 FINDINGS: Status post median sternotomy. Normal cardiopericardial silhouette. No consolidation, pneumothorax or effusion. Mild linear changes at the lung bases. Atelectasis or scar is  favored. Eventration of the right hemidiaphragm. Degenerative changes along the spine. Surgical changes in the upper abdomen. IMPRESSION: Postop chest. Chronic lung changes. Mild basilar atelectasis or scar. Electronically Signed   By: Karen Kays M.D.   On: 03/17/2023 17:24    Procedures Procedures    Medications Ordered in ED Medications  furosemide (LASIX) injection 80 mg (has no administration in time range)    ED Course/ Medical Decision Making/ A&P                             Medical Decision Making Amount and/or Complexity of Data Reviewed Labs: ordered. Radiology: ordered.  Risk Prescription drug management. Decision regarding hospitalization.    This patient presents to the ED for concern of edema in legs, this involves an extensive number of treatment options, and is a complaint that carries with it a high risk of complications and morbidity.  The differential diagnosis includes heart failure and kidney failure   Co morbidities that complicate the patient evaluation  Heart failure and  kidney disease   Additional history obtained:  Additional history obtained from patient External records from outside source obtained and reviewed including hospital records   Lab Tests:  I Ordered, and personally interpreted labs.  The pertinent results include: Hemoglobin 10.2   Imaging Studies ordered:  I ordered imaging studies including chest x-ray I independently visualized and interpreted imaging which showed no acute disease I agree with the radiologist interpretation   Cardiac Monitoring: / EKG:  The patient was maintained on a cardiac monitor.  I personally viewed and interpreted the cardiac monitored which showed an underlying rhythm of: Normal sinus rhythm   Consultations Obtained:  I requested consultation with the hospitalist,  and discussed lab and imaging findings as well as pertinent plan - they recommend: Admit and diurese patient   Problem List  / ED Course / Critical interventions / Medication management  Heart failure and liver failure I ordered medication including Lasix for edema Reevaluation of the patient after these medicines showed that the patient stayed the same I have reviewed the patients home medicines and have made adjustments as needed   Social Determinants of Health:  None   Test / Admission - Considered:  None  Patient patient with anasarca.  She will be admitted to medicine and given IV Lasix        Final Clinical Impression(s) / ED Diagnoses Final diagnoses:  Anasarca    Rx / DC Orders ED Discharge Orders     None         Bethann Berkshire, MD 03/19/23 1238

## 2023-03-17 NOTE — ED Triage Notes (Addendum)
Pt c/o generalized swelling and facial edema over the past week with over 10 lb fluid retention. Sent by PCP for evaluation following call to Dr. Estell Harpin. Pt reports fatigue and feels like something is not right. Pt has hx CHF and stage V kidney failure, not on dialysis and still producing small amounts of urine. 2+ edema noted to bilateral ankles

## 2023-03-17 NOTE — ED Notes (Signed)
Unable to obtain EKG at time of triage as both portable machines are out of service and all ER rooms are occupied. Charge nurse aware.

## 2023-03-17 NOTE — ED Notes (Signed)
Patient transported to x-ray. ?

## 2023-03-17 NOTE — H&P (Signed)
History and Physical    Meghan Terry ZOX:[id number] DOB: 11-24-1960 DOA: 03/17/2023  PCP: Benita Stabile, MD (Confirm with patient/family/NH records and if not entered, this has to be entered at [facility] point of entry) Patient coming from: Home  I have personally briefly reviewed patient's old medical records in [facility]  Chief Complaint: SOB, leg swelling and weight gaining  HPI: Meghan Terry is a 62 y.o. female with medical history significant of chronic HFpEF, cor pulmonale with mild reduced RV function, CAD status post CABG in 11/2018 and PCI, CKD stage IV, IIDM, HTN, HLD, restless leg syndrome, presented with worsening of peripheral edema and shortness of breath.  Symptoms started 2 to 3 weeks ago, patient reported gradually and slowly she started to accumulate fluid again, she was recently hospitalized 2 times in last 32-month for similar presentation of preop and exertional dyspnea, denies any chest pain no fever chills, she went to see cardiology who initially increased her torsemide to 80 mg twice daily, last week her nephrology further increased her torsemide to 100 mg twice daily.  Despite, she continues to gain weight estimated weight gaining of more than 10 pounds in 1 month.  ED Course: Afebrile, nontachycardic nonhypotensive nonhypoxic, SBP 110s.  Checks x-ray showed mild pulmonary congestion, blood work showed creatinine 2.8 compared to baseline 2.6, bicarb 31 K3.5.  Patient was given one-time dose of 80 mg IV Lasix in the ED  Review of Systems: As per HPI otherwise 14 point review of systems negative.   Past Medical History:  Diagnosis Date   Acute diastolic CHF (congestive heart failure) (HCC) 06/08/2021   Acute pulmonary edema (HCC)    Anemia due to stage 4 chronic kidney disease (HCC) 07/20/2022   Angina pectoris (HCC) 11/17/2019   Anxiety    Atypical chest pain 12/30/2020   Benign hypertension with CKD (chronic kidney disease) stage IV (HCC) 07/20/2022   CHF  (congestive heart failure) (HCC) 06/08/2021   CKD (chronic kidney disease) stage 3b, GFR 30-59 ml/min 12/03/2019   Coronary artery disease    Coronary artery disease involving native coronary artery of native heart with angina pectoris (HCC) 10/03/2019   Depression 04/25/2017   Diabetes mellitus due to underlying condition with unspecified complications (HCC) 09/05/2019   Diabetes mellitus with stage 4 chronic kidney disease GFR 15-29 (HCC) 07/20/2022   Essential hypertension 09/05/2019   Ex-smoker 09/05/2019   Family history of coronary artery disease 09/05/2019   History of depression 04/15/2020   Hx of diabetes mellitus 04/15/2020   Hyperlipidemia with target LDL less than 70 11/24/2019   Hyperphosphatemia 07/20/2022   Hypertension    Hypokalemia 04/30/2022   Hypothyroid    Migraine 04/25/2017   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   Syncope and collapse 04/29/2022   Thyroid disease    UTI (urinary tract infection) 04/30/2022    Past Surgical History:  Procedure Laterality Date   ABDOMINAL HYSTERECTOMY     APPENDECTOMY     BIOPSY  12/23/2022   Procedure: BIOPSY;  Surgeon: Corbin Ade, MD;  [facility];  Service: Endoscopy;;   CARDIAC CATHETERIZATION  11/22/2019   CHOLECYSTECTOMY  COLONOSCOPY     COLONOSCOPY WITH PROPOFOL N/A 12/25/2022   Procedure: COLONOSCOPY WITH PROPOFOL;  Surgeon: Dolores Frame, MD;  [facility]: [facility];  Service: Gastroenterology;  Laterality: N/A;   CORONARY ARTERY BYPASS GRAFT N/A 09/15/2019   Procedure: CORONARY ARTERY BYPASS GRAFTING (CABG) times three on pump using left internal mammary artery and right and  left greater saphenous veins harvested endoscopically;  Surgeon: Alleen Borne, MD;  Location: [facility];  Service: Open Heart Surgery;  Laterality: N/A;   CORONARY STENT INTERVENTION N/A 11/23/2019   Procedure: CORONARY STENT INTERVENTION;  Surgeon: Lennette Bihari, MD;  Location: [facility] IN[facility]AS[facility] CV LAB;  Service: Cardiovascular;  Laterality: N/A;   ESOPHAGEAL DILATION N/A 12/23/2022   Procedure: ESOPHAGEAL DILATION;  Surgeon: Corbin Ade, MD;  [facility];  Service: Endoscopy;  Laterality: N/A;   ESOPHAGOGASTRODUODENOSCOPY (EGD) WITH PROPOFOL N/A 12/23/2022   Procedure: ESOPHAGOGASTRODUODENOSCOPY (EGD) WITH PROPOFOL;  Surgeon: Corbin Ade, MD;  [facility];  Service: Endoscopy;  Laterality: N/A;   KNEE ARTHROSCOPY     LEFT HEART CATH AND CORONARY ANGIOGRAPHY N/A 09/14/2019   Procedure: LEFT HEART CATH AND CORONARY ANGIOGRAPHY;  Surgeon: Corky Crafts, MD;  Location: [facility] INVASIVE CV LAB;  Service: Cardiovascular;  Laterality: N/A;   LEFT HEART CATH AND CORS/GRAFTS ANGIOGRAPHY N/A 11/22/2019   Procedure: LEFT HEART CATH AND CORS/GRAFTS ANGIOGRAPHY;  Surgeon: Lennette Bihari, MD;  [facility]: [facility];  Service: Cardiovascular;  Laterality: N/A;   LEFT HEART CATH AND CORS/GRAFTS ANGIOGRAPHY N/A 03/13/2020   Procedure: LEFT HEART CATH AND CORS/GRAFTS ANGIOGRAPHY;  Surgeon: Runell Gess, MD;  [facility]: [facility];  Service: Cardiovascular;  Laterality: N/A;   LEFT HEART CATH AND CORS/GRAFTS ANGIOGRAPHY N/A 12/31/2020   Procedure: LEFT HEART CATH AND CORS/GRAFTS ANGIOGRAPHY;  Surgeon: Lyn Records, MD;  Location: [facility];  Service: Cardiovascular;  Laterality: N/A;   OSTEOCHONDRAL DEFECT REPAIR/RECONSTRUCTION Left 11/29/2014   Procedure: LEFT ANKLE MEDIAL MALLEOLUS OSTEOTOMY,AUTO GRAFT FROM CALCANEOUS;  OS TIBIA DENORO GRAFTING TALUS;  Surgeon: Toni Arthurs, MD;  Location: [facility];  Service: Orthopedics;  Laterality: Left;    POLYPECTOMY  12/25/2022   Procedure: POLYPECTOMY;  Surgeon: Dolores Frame, MD;  [facility];  Service: Gastroenterology;;   TEE WITHOUT CARDIOVERSION N/A 09/15/2019   Procedure: TRANSESOPHAGEAL ECHOCARDIOGRAM (TEE);  Surgeon: Alleen Borne, MD;  Location: [facility] OR;  Service: Open Heart Surgery;  Laterality: N/A;   TUBAL LIGATION       reports that she quit smoking about 3 years ago. Her smoking use included cigarettes. She has a 47.00 pack-year smoking history. She has been exposed to tobacco smoke. She has never used smokeless tobacco. She reports that she does not drink alcohol and does not use drugs.  Allergies  Allergen Reactions   Abilify [Aripiprazole]    Ciprofloxacin Nausea And Vomiting   Carbidopa-Levodopa Other (See Comments)    Developed tics while taking    Prednisone Other (See Comments)    Turns red all over    Venlafaxine Other (See Comments)    Developed tics while taking - reaction to Effexor     Family History  Problem Relation Age of Onset   Asthma Mother    COPD Mother    Diabetes Mother    Macular degeneration Mother    Congestive Heart Failure Father      Prior to Admission medications   Medication Sig Start Date End  Date Taking? Authorizing Provider  acetaminophen (TYLENOL) 500 MG tablet Take 2 tablets by mouth 3 times daily as needed for pain 12/08/21     aspirin EC 81 MG tablet Take 1 tablet (81 mg total) by mouth daily. 09/05/19   Revankar, Aundra Dubin, MD  buPROPion (WELLBUTRIN XL) 300 MG 24 hr tablet Take 1 tablet (300 mg total) by mouth daily. 07/06/22     cholecalciferol 25 MCG (1000 UT) tablet Take 1 tablet (1,000 Units total) by mouth in the morning and at bedtime. 12/25/22   Johnson, Clanford L, MD  clopidogrel (PLAVIX) 75 MG tablet TAKE 1 TABLET BY MOUTH DAILY 03/30/22   Revankar, Aundra Dubin, MD  cyanocobalamin (VITAMIN B12) 1000 MCG tablet Take 1 tablet (1,000 mcg total) by mouth daily. 12/25/22   Johnson, Clanford L, MD   dapagliflozin propanediol (FARXIGA) 10 MG TABS tablet Take 1 tablet (10 mg total) by mouth daily before breakfast. 12/26/22   Johnson, Clanford L, MD  ferrous sulfate 325 (65 FE) MG EC tablet Take 325 mg by mouth 2 (two) times daily.    [provider]  folic acid (FOLVITE) 1 MG tablet Take 1 tablet (1 mg total) by mouth daily. 12/26/22   Johnson, Clanford L, MD  gabapentin (NEURONTIN) 100 MG capsule Take 3 capsules (300 mg total) by mouth 3 (three) times daily as needed. 02/02/23   Levert Feinstein, MD  levothyroxine (SYNTHROID) 50 MCG tablet Take 1 tablet (50 mcg total) by mouth See admin instructions. Take 1 tablet in the morning with breakfast on SATURDAY AND SUNDAY. 02/21/23   Cleora Fleet, MD  levothyroxine (SYNTHROID) 75 MCG tablet Take 1 tablet (75 mcg total) by mouth daily Monday thru Friday. 06/08/22     linaclotide (LINZESS) 145 MCG CAPS capsule Take 1 capsule (145 mcg total) by mouth daily before breakfast. 01/21/23   Aida Raider, NP  metolazone (ZAROXOLYN) 5 MG tablet Take 1 tablet (5 mg total) by mouth 3 (three) times a week. 02/24/23   Mallipeddi, Vishnu P, MD  metoprolol succinate (TOPROL-XL) 25 MG 24 hr tablet Take 0.5 tablets (12.5 mg total) by mouth at bedtime. Take with or immediately following a meal. 12/25/22   Johnson, Clanford L, MD  nitroGLYCERIN (NITROSTAT) 0.4 MG SL tablet Place 1 tablet (0.4 mg total) under the tongue every 5 (five) minutes as needed for chest pain. 12/29/21   Revankar, Aundra Dubin, MD  ondansetron (ZOFRAN) 4 MG tablet take 1 tablet (4 mg) by mouth 2 times per day as needed for nausea 05/12/22     pantoprazole (PROTONIX) 40 MG tablet Take 1 tablet (40 mg total) by mouth 2 (two) times daily. 12/25/22   Johnson, Clanford L, MD  Polyvinyl Alcohol-Povidone (REFRESH OP) Apply 1 drop to eye 4 (four) times daily.    [provider]  Potassium Chloride ER 20 MEQ TBCR Take 40 mEq by mouth 2 (two) times daily.    [provider]  promethazine  (PHENERGAN) 25 MG tablet Take 1 tablet (25 mg total) by mouth every 6 (six) hours as needed for nausea or vomiting. 12/25/22   Laural Benes, Clanford L, MD  ranolazine (RANEXA) 1000 MG SR tablet Take 1 tablet (1,000 mg total) by mouth 2 (two) times daily. 02/04/23   Mallipeddi, Vishnu P, MD  rOPINIRole (REQUIP) 4 MG tablet Take 1 tablet (4 mg total) by mouth 1 to 3 hours before bedtime. 06/29/22     rosuvastatin (CRESTOR) 40 MG tablet Take 1 tablet (40 mg total)  by mouth at bedtime. 12/25/22   Johnson, Clanford L, MD  topiramate (TOPAMAX) 100 MG tablet TAKE 1 TABLET BY MOUTH TWICE DAILY. 05/12/22     torsemide (DEMADEX) 20 MG tablet Take 3 tablets (60 mg total) by mouth 2 (two) times daily. 02/24/23   Mallipeddi, Vishnu P, MD  traMADol (ULTRAM) 50 MG tablet Take 1 tablet (50 mg total) by mouth daily as needed for moderate pain. 02/21/23   Johnson, Clanford L, MD  traZODone (DESYREL) 100 MG tablet Take 150 mg by mouth at bedtime.    [provider]    Physical Exam: Vitals:   03/17/23 1651 03/17/23 1738 03/17/23 1740  BP: (!) 116/49 121/60   Pulse: 91 81 79  Resp: 20 17 11   Temp: 98.5 F (36.9 C)    TempSrc: Oral    SpO2: 100% 99% 100%  Weight: 68 kg    Height: 5\' 3"  (1.6 m)      Constitutional: NAD, calm, comfortable Vitals:   03/17/23 1651 03/17/23 1738 03/17/23 1740  BP: (!) 116/49 121/60   Pulse: 91 81 79  Resp: 20 17 11   Temp: 98.5 F (36.9 C)    TempSrc: Oral    SpO2: 100% 99% 100%  Weight: 68 kg    Height: 5\' 3"  (1.6 m)     Eyes: PERRL, lids and conjunctivae normal ENMT: Mucous membranes are moist. Posterior pharynx clear of any exudate or lesions.Normal dentition.  Neck: normal, supple, no masses, no thyromegaly Respiratory: clear to auscultation bilaterally, no wheezing, bilateral basilar fine crackles. Normal respiratory effort. No accessory muscle use.  Cardiovascular: Regular rate and rhythm, no murmurs / rubs / gallops. 2+ extremity edema. 2+ pedal pulses. No carotid  bruits.  Abdomen: no tenderness, no masses palpated. No hepatosplenomegaly. Bowel sounds positive.  Musculoskeletal: no clubbing / cyanosis. No joint deformity upper and lower extremities. Good ROM, no contractures. Normal muscle tone.  Skin: no rashes, lesions, ulcers. No induration Neurologic: CN 2-12 grossly intact. Sensation intact, DTR normal. Strength 5/5 in all 4.  Psychiatric: Normal judgment and insight. Alert and oriented x 3. Normal mood.     Labs on Admission: I have personally reviewed following labs and imaging studies  CBC: Recent Labs  Lab 03/17/23 1703  WBC 8.4  HGB 10.2*  HCT 32.7*  MCV 94.0  PLT 243   Basic Metabolic Panel: Recent Labs  Lab 03/12/23 1520 03/17/23 1703  NA 134* 133*  K 4.0 3.5  CL 96* 90*  CO2 27 31  GLUCOSE 154* 100*  BUN 39* 46*  CREATININE 2.60* 2.82*  CALCIUM 8.6* 8.7*   GFR: Estimated Creatinine Clearance: 18.9 mL/min (A) (by C-G formula based on SCr of 2.82 mg/dL (H)). Liver Function Tests: No results for input(s): "AST", "ALT", "ALKPHOS", "BILITOT", "PROT", "ALBUMIN" in the last 168 hours. No results for input(s): "LIPASE", "AMYLASE" in the last 168 hours. No results for input(s): "AMMONIA" in the last 168 hours. Coagulation Profile: Recent Labs  Lab 03/17/23 1703  INR 1.0   Cardiac Enzymes: No results for input(s): "CKTOTAL", "CKMB", "CKMBINDEX", "TROPONINI" in the last 168 hours. BNP (last 3 results) No results for input(s): "PROBNP" in the last 8760 hours. HbA1C: No results for input(s): "HGBA1C" in the last 72 hours. CBG: No results for input(s): "GLUCAP" in the last 168 hours. Lipid Profile: No results for input(s): "CHOL", "HDL", "LDLCALC", "TRIG", "CHOLHDL", "LDLDIRECT" in the last 72 hours. Thyroid Function Tests: No results for input(s): "TSH", "T4TOTAL", "FREET4", "T3FREE", "THYROIDAB" in the last  72 hours. Anemia Panel: No results for input(s): "VITAMINB12", "FOLATE", "FERRITIN", "TIBC", "IRON",  "RETICCTPCT" in the last 72 hours. Urine analysis:    Component Value Date/Time   COLORURINE STRAW (A) 03/12/2023 1325   APPEARANCEUR CLEAR 03/12/2023 1325   LABSPEC 1.004 (L) 03/12/2023 1325   PHURINE 7.0 03/12/2023 1325   GLUCOSEU 50 (A) 03/12/2023 1325   HGBUR NEGATIVE 03/12/2023 1325   BILIRUBINUR NEGATIVE 03/12/2023 1325   KETONESUR NEGATIVE 03/12/2023 1325   PROTEINUR NEGATIVE 03/12/2023 1325   NITRITE NEGATIVE 03/12/2023 1325   LEUKOCYTESUR SMALL (A) 03/12/2023 1325    Radiological Exams on Admission: DG Chest 2 View  Result Date: 03/17/2023 CLINICAL DATA:  Weakness.  Chest pain EXAM: CHEST - 2 VIEW COMPARISON:  X-ray 02/18/2023 FINDINGS: Status post median sternotomy. Normal cardiopericardial silhouette. No consolidation, pneumothorax or effusion. Mild linear changes at the lung bases. Atelectasis or scar is favored. Eventration of the right hemidiaphragm. Degenerative changes along the spine. Surgical changes in the upper abdomen. IMPRESSION: Postop chest. Chronic lung changes. Mild basilar atelectasis or scar. Electronically Signed   By: Karen Kays M.D.   On: 03/17/2023 17:24    EKG: Independently reviewed.  Sinus rhythm, no acute ST changes  Assessment/Plan Principal Problem:   HFpEF Active Problems:   Acute diastolic CHF (congestive heart failure) (HCC)   AKI on CKD IV   Acute on chronic diastolic (congestive) heart failure (HCC)  (please populate well all problems here in Problem List. (For example, if patient is on BP meds at home and you resume or decide to hold them, it is a problem that needs to be her. Same for CAD, COPD, HLD and so on)  Acute on chronic HFpEF decompensation, outpatient management -Review of recent hospitalization patient had a successful IV diuresis with regimen of IV Lasix 80 mg twice daily on last admission in May, will start IV Lasix 80 mg twice daily, follow-up daily kidney function -Continue metolazone, reduce dose to 2.5 mg daily  CKD  stage IV -Creatinine level stable, UA outpatient on 6/14 showed no significant UTI or proteinuria -Diuresis as above  CAD status post CABG -No chest pains, EKG showed no significant acute ST changes -Continue daily ranolazine, aspirin Plavix metoprolol and Crestor  IIDM -Most recent A1c 4.2 -Hold off Farxiga, start SSI  Anemia, chronic secondary to CKD -H&H stable, no overt symptoms signs of bleeding   DVT prophylaxis: Heparin subcu Code Status: DNR Family Communication: None at bedside Disposition Plan: Patient is sick with acute CHF decompensation failed outpatient treatment with a baseline CKD stage IV will need close monitoring while on IV diuresis, expect more than 2 midnight hospital stay Consults called: None Admission status: Tele admit   Emeline General MD [facility] Pager [phone]  03/17/2023, 6:39 PM

## 2023-03-18 ENCOUNTER — Inpatient Hospital Stay (HOSPITAL_COMMUNITY): Payer: Medicare Other

## 2023-03-18 DIAGNOSIS — E785 Hyperlipidemia, unspecified: Secondary | ICD-10-CM

## 2023-03-18 DIAGNOSIS — I10 Essential (primary) hypertension: Secondary | ICD-10-CM

## 2023-03-18 DIAGNOSIS — I5033 Acute on chronic diastolic (congestive) heart failure: Secondary | ICD-10-CM | POA: Diagnosis not present

## 2023-03-18 DIAGNOSIS — I2510 Atherosclerotic heart disease of native coronary artery without angina pectoris: Secondary | ICD-10-CM

## 2023-03-18 LAB — BASIC METABOLIC PANEL
Anion gap: 14 (ref 5–15)
BUN: 48 mg/dL — ABNORMAL HIGH (ref 8–23)
CO2: 32 mmol/L (ref 22–32)
Calcium: 8.9 mg/dL (ref 8.9–10.3)
Chloride: 91 mmol/L — ABNORMAL LOW (ref 98–111)
Creatinine, Ser: 2.88 mg/dL — ABNORMAL HIGH (ref 0.44–1.00)
GFR, Estimated: 18 mL/min — ABNORMAL LOW (ref >60)
Glucose, Bld: 86 mg/dL (ref 70–99)
Potassium: 3.1 mmol/L — ABNORMAL LOW (ref 3.5–5.1)
Sodium: 137 mmol/L (ref 135–145)

## 2023-03-18 LAB — URINALYSIS, ROUTINE W REFLEX MICROSCOPIC
Bilirubin Urine: NEGATIVE
Glucose, UA: 150 mg/dL — AB
Hgb urine dipstick: NEGATIVE
Ketones, ur: NEGATIVE mg/dL
Nitrite: NEGATIVE
Protein, ur: NEGATIVE mg/dL
Specific Gravity, Urine: 1.008 (ref 1.005–1.030)
pH: 7 (ref 5.0–8.0)

## 2023-03-18 LAB — GLUCOSE, CAPILLARY
Glucose-Capillary: 82 mg/dL (ref 70–99)
Glucose-Capillary: 171 mg/dL — ABNORMAL HIGH (ref 70–99)
Glucose-Capillary: 157 mg/dL — ABNORMAL HIGH (ref 70–99)
Glucose-Capillary: 112 mg/dL — ABNORMAL HIGH (ref 70–99)

## 2023-03-18 MED ORDER — TIZANIDINE HCL 2 MG PO TABS
2.0000 mg | ORAL_TABLET | Freq: Four times a day (QID) | ORAL | Status: DC | PRN
  Administered 2023-03-18 – 2023-03-19 (×2): 2 mg via ORAL
  Filled 2023-03-18 (×2): qty 1

## 2023-03-18 NOTE — Progress Notes (Signed)
PT Cancellation Note  Patient Details Name: Meghan Terry MRN: [id number] DOB: 04-May-1961   Cancelled Treatment:    Reason Eval/Treat Not Completed: Medical issues which prohibited therapy.  Physical therapy held due to patient having low BP.  Will check back when medically stable.  3:10 PM, 03/18/23 Ocie Bob, MPT Physical Therapist with [facility] [phone] office [phone] mobile phone

## 2023-03-18 NOTE — Consult Note (Signed)
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 Wolfgang Phoenix as an outpatient, last seen in April 2024, Cr 1.7 at that time however Cr has been overall fluctuant since then -Cr currently stable at 2.8. Likely has a component of CRS. Agree with lasix 80mg  IV BID with metolazone 2.5mg  daily. Agree with holding farxiga while she is undergoing aggressive diuresis -UA, renal ultrasound -Avoid nephrotoxic medications including NSAIDs and iodinated intravenous contrast exposure unless the latter is absolutely indicated.  Preferred narcotic agents for pain control are hydromorphone, fentanyl, and methadone. Morphine should not be used. Avoid Baclofen and avoid oral sodium phosphate and magnesium citrate based laxatives / bowel preps. Continue strict Input and Output monitoring. Will monitor the patient closely with you and intervene or adjust therapy as indicated by changes in clinical status/labs   Acute on chronic HFpEF exacerbation -diuretics as above. If fails to diurese adequately and/or has worsening kidney function will likely need a RHC to accurately determine volume status. Monitor strict I/O, daily weights. -as she gets more euvolemic with IV diuresis, will determine outpatient diuretic regimen  Hypertension -diuresis as above  DM2 -per primary service  Hypokalemia -replete prn  Anemia of CKD -transfuse prn for hgb <7. Hgb currently acceptable for CKD -will check Fe panel  CKD MBD -Check PO4  Recommendations conveyed to primary service.    Anthony Sar [facility] 03/18/2023 9:07 AM   _____________________________________________________________________________________   History of Present Illness: Meghan Terry is a/an 62 y.o. female with a past medical history of CKD, HTN, DM2, CAD s/o CABG, dCHF, RLS, HLD who presents to [facility] with worsening swelling and SOB. Has had 2 hospitalizations in the last couple of months. Despite  escalating diuretic regimen as an outpatient, her swelling worsened. Was up to torsemide 100mg  BID. Was started on lasix 80mg  IV BID here along with metolazone 2.5mg  daily. UOP since admit is 1L with 1 unmeasured void. Weight is down from 66.1 to 65kg today. Patient seen and examined bedside. She reports feeling about the same, slightly better in regards to swelling. She also reported that she had some facial swelling as well. In regards to breathing, she reports that her breathing is fine provided she is not moving around, but does report DOE. She does have a sister who recently started dialysis in [place], [place] 2 weeks, patient will let us know more details about that. Denies any chest pain, decreased urinary frequency.   Medications:  Current Facility-Administered Medications  Medication Dose Route Frequency Provider Last Rate Last Admin   0.9 %  sodium chloride infusion  250 mL Intravenous PRN Mikey College T, MD       acetaminophen (TYLENOL) tablet 650 mg  650 mg Oral Q4H PRN Emeline General, MD       aspirin EC tablet 81 mg  81 mg Oral Daily Mikey College T, MD       buPROPion (WELLBUTRIN XL) 24 hr tablet 300 mg  300 mg Oral Daily Mikey College T, MD       clopidogrel (PLAVIX) tablet 75 mg  75 mg Oral Daily Mikey College T, MD       furosemide (LASIX) injection 80 mg  80 mg Intravenous BID Mikey College T, MD       gabapentin (NEURONTIN) capsule 300 mg  300 mg Oral TID PRN Mikey College T, MD   300 mg at 03/17/23 2200   heparin injection 5,000 Units  5,000 Units Subcutaneous Q12H Mikey College  T, MD   5,000 Units at 03/17/23 2202   insulin aspart (novoLOG) injection 0-9 Units  0-9 Units Subcutaneous TID WC Emeline General, MD       [START ON 03/20/2023] levothyroxine (SYNTHROID) tablet 50 mcg  50 mcg Oral Once per day on Sun Sat Zhang, Ping T, MD       levothyroxine (SYNTHROID) tablet 75 mcg  75 mcg Oral Once per day on Mon Tue Wed Thu Fri Zhang, Ping T, MD   75 mcg at 03/18/23 0518   linaclotide (LINZESS)  capsule 145 mcg  145 mcg Oral QAC breakfast Emeline General, MD       metolazone (ZAROXOLYN) tablet 2.5 mg  2.5 mg Oral Daily Mikey College T, MD       metoprolol succinate (TOPROL-XL) 24 hr tablet 12.5 mg  12.5 mg Oral QHS Mikey College T, MD       ondansetron [facility]) injection 4 mg  4 mg Intravenous Q6H PRN Emeline General, MD       ondansetron [facility]) tablet 4 mg  4 mg Oral Q8H PRN Emeline General, MD       pantoprazole (PROTONIX) EC tablet 40 mg  40 mg Oral BID Mikey College T, MD   40 mg at 03/17/23 2201   potassium chloride SA (KLOR-CON M) CR tablet 40 mEq  40 mEq Oral BID Mosetta Anis, RPH       ranolazine (RANEXA) 12 hr tablet 1,000 mg  1,000 mg Oral BID Mikey College T, MD   1,000 mg at 03/17/23 2201   rOPINIRole (REQUIP) tablet 4 mg  4 mg Oral QHS Mikey College T, MD   4 mg at 03/17/23 2159   rosuvastatin (CRESTOR) tablet 40 mg  40 mg Oral QHS Mikey College T, MD   40 mg at 03/17/23 2159   sodium chloride flush (NS) 0.9 % injection 3 mL  3 mL Intravenous Q12H Mikey College T, MD   3 mL at 03/17/23 2202   sodium chloride flush (NS) 0.9 % injection 3 mL  3 mL Intravenous PRN Mikey College T, MD       topiramate (TOPAMAX) tablet 100 mg  100 mg Oral BID Mikey College T, MD   100 mg at 03/17/23 2200   traMADol (ULTRAM) tablet 50 mg  50 mg Oral Daily PRN Emeline General, MD       traZODone (DESYREL) tablet 150 mg  150 mg Oral QHS Mikey College T, MD   150 mg at 03/17/23 2200     ALLERGIES Abilify [aripiprazole], Ciprofloxacin, Carbidopa-levodopa, Prednisone, and Venlafaxine  MEDICAL HISTORY Past Medical History:  Diagnosis Date   Acute diastolic CHF (congestive heart failure) (HCC) 06/08/2021   Acute pulmonary edema (HCC)    Anemia due to stage 4 chronic kidney disease (HCC) 07/20/2022   Angina pectoris (HCC) 11/17/2019   Anxiety    Atypical chest pain 12/30/2020   Benign hypertension with CKD (chronic kidney disease) stage IV (HCC) 07/20/2022   CHF (congestive heart failure) (HCC) 06/08/2021   CKD  (chronic kidney disease) stage 3b, GFR 30-59 ml/min 12/03/2019   Coronary artery disease    Coronary artery disease involving native coronary artery of native heart with angina pectoris (HCC) 10/03/2019   Depression 04/25/2017   Diabetes mellitus due to underlying condition with unspecified complications (HCC) 09/05/2019   Diabetes mellitus with stage 4 chronic kidney disease GFR 15-29 (HCC) 07/20/2022   Essential hypertension 09/05/2019   Ex-smoker 09/05/2019   Family  history of coronary artery disease 09/05/2019   History of depression 04/15/2020   Hx of diabetes mellitus 04/15/2020   Hyperlipidemia with target LDL less than 70 11/24/2019   Hyperphosphatemia 07/20/2022   Hypertension    Hypokalemia 04/30/2022   Hypothyroid    Migraine 04/25/2017   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   Syncope and collapse 04/29/2022   Thyroid disease    UTI (urinary tract infection) 04/30/2022     SOCIAL HISTORY Social History   Socioeconomic History   Marital status: Married    Spouse name: Laci Pfender   Number of children: 2   Years of education: Not on file   Highest education level: Associate degree: occupational, Scientist, product/process development, or vocational program  Occupational History   Occupation: disabilty    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  Tobacco Use   Smoking status: Former    Packs/day: 1.00    Years: 47.00    Additional pack years: 0.00    Total pack years: 47.00    Types: Cigarettes    Quit date: 09/15/2019    Years since quitting: 3.5    Passive exposure: Past   Smokeless tobacco: Never  Vaping Use   Vaping Use: Every day   Start date: 09/15/2019   Substances: Nicotine   Substance and Sexual Activity   Alcohol use: No   Drug use: No   Sexual activity: Not on file  Other Topics Concern   Not on file  Social History Narrative   Not on file   Social Determinants of Health   Financial Resource Strain: High Risk (06/19/2021)   Overall Financial Resource Strain (CARDIA)    Difficulty of Paying Living Expenses: Very hard  Food Insecurity: No Food Insecurity (03/17/2023)   Hunger Vital Sign    Worried About Running Out of Food in the Last Year: Never true    Ran Out of Food in the Last Year: Never true  Transportation Needs: No Transportation Needs (03/17/2023)   PRAPARE - Administrator, Civil Service (Medical): No    Lack of Transportation (Non-Medical): No  Physical Activity: Not on file  Stress: Not on file  Social Connections: Not on file  Intimate Partner Violence: Not At Risk (03/17/2023)   Humiliation, Afraid, Rape, and Kick questionnaire    Fear of Current or Ex-Partner: No    Emotionally Abused: No    Physically Abused: No    Sexually Abused: No     FAMILY HISTORY Family History  Problem Relation Age of Onset   Asthma Mother    COPD Mother    Diabetes Mother    Macular degeneration Mother    Congestive Heart Failure Father     FHX: +CKD/dialysis in sister  Review of Systems: 12 systems reviewed Otherwise as per HPI, all other systems reviewed and negative  Physical Exam: Vitals:   03/18/23 0438 03/18/23 0836  BP: (!) 89/48 (!) 110/53  Pulse: 74 76  Resp:    Temp: 98.4 F (36.9 C) 98 F (36.7 C)  SpO2: 100% 98%   No intake/output data recorded.  Intake/Output Summary (Last 24 hours) at 03/18/2023 1610 Last data filed at 03/18/2023  0700 Gross per 24 hour  Intake 480 ml  Output 1000 ml  Net -520 ml   General: well-appearing, no acute distress HEENT: anicteric sclera, oropharynx clear without lesions CV: regular rate, normal rhythm, no murmurs, no gallops, no rubs Lungs: normal WOB, fine crackles left base  otherwise CTA B/L Abd: soft, non-tender, non-distended Skin: no visible lesions or rashes Psych: alert, engaged, appropriate mood and affect Musculoskeletal: very trace edema b/l Les, trace pedal edema b/l LEs Neuro: normal speech, no gross focal deficits   Test Results Reviewed Lab Results  Component Value Date   NA 137 03/18/2023   K 3.1 (L) 03/18/2023   CL 91 (L) 03/18/2023   CO2 32 03/18/2023   BUN 48 (H) 03/18/2023   CREATININE 2.88 (H) 03/18/2023   CALCIUM 8.9 03/18/2023   ALBUMIN 3.3 (L) 01/25/2023   PHOS 3.4 01/01/2021     I have reviewed all relevant outside healthcare records related to the patient's kidney injury.

## 2023-03-18 NOTE — Consult Note (Addendum)
Cardiology Consultation   Patient ID: [name] MRN: [id number]; DOB: May 16, 1961  Admit date: 03/17/2023 Date of Consult: 03/18/2023  PCP:  Benita Stabile, MD   [facility] Providers Cardiologist:  Marjo Bicker, MD        Patient Profile:   Meghan Terry is a 62 y.o. female with a hx of CAD (s/p CABG in 2020 with LIMA-LAD, SVG-OM and SVG-PDA, prior subtotal occlusion of PDA which was felt to be due to spasm, DESx3 to RCA in 10/2019, cath in 12/2020 showing patent grafts and medical management recommended), HTN, HLD, Type II DM, Stage 4 CKD, anemia (EGD showing esophagitis and gastritis) and HFpEF who is being seen 03/18/2023 for the evaluation of CHF at the request of Dr. Jarvis Newcomer.  History of Present Illness:   Ms. Ringgold was admitted from 4/29 - 5/1 for an acute CHF exacerbation and responded well to IV Lasix 60mg  BID and was discharged on Torsemide 40mg  daily with weight at 135 lbs. Was again admitted from 5/23 - 02/21/2023 for an acute CHF exacerbation and diuresed with IV Lasix 80mg  BID and was discharged on 40mg  BID with weight at 139 lbs. She did see Dr. Jenene Slicker on 02/24/2023 and weight had increased slightly to 140 lbs, therefore Torsemide was titrated to 60mg  BID and she was started on Metolazone 5mg  three times weekly. Was referred to Advanced Heart Failure and met with Dr. Gasper Lloyd on 03/05/2023 and had 2+ pitting edema on examination. Was recommended to do Furoscix x1 and increase Torsemide to 80mg  BID. Was continued on Farxiga 10mg  daily and was not started on Spironolactone, ACE-I, ARB or Entresto given her renal function.   She presented back to the ED on 03/17/2023 for evaluation of lower extremity edema and a 10 lb weight gain. In talking with the patient today, she reports her weight had increased to 155 lbs on her home scales despite her Nephrologist titrating Torsemide to 100 mg twice daily last week along with taking Metolazone 3 days/week. Reports worsening  dyspnea on exertion during this timeframe along with orthopnea and PND. She does sleep with a wedge pillow at night. She denies any chest pain or palpitations.  Initial labs show WBC 8.4, Hgb 10.2, platelets 243, Na+ 133, K+ 3.5 and creatinine 2.82 (variable from 2.2 to 3.3 within the past month). BNP 105. TSH 6.955. CXR with chronic lung changes and mild basilar atelectasis. EKG showed NSR, HR 82 with no acute ST changes.   She has been started on IV Lasix 80 mg twice daily and is receiving Metolazone 2.5 mg daily. Weight recorded as down to 143 lbs this morning.     Past Medical History:  Diagnosis Date   Acute diastolic CHF (congestive heart failure) (HCC) 06/08/2021   Acute pulmonary edema (HCC)    Anemia due to stage 4 chronic kidney disease (HCC) 07/20/2022   Angina pectoris (HCC) 11/17/2019   Anxiety    Atypical chest pain 12/30/2020   Benign hypertension with CKD (chronic kidney disease) stage IV (HCC) 07/20/2022   CHF (congestive heart failure) (HCC) 06/08/2021   CKD (chronic kidney disease) stage 3b, GFR 30-59 ml/min 12/03/2019   Coronary artery disease    Coronary artery disease involving native coronary artery of native heart with angina pectoris (HCC) 10/03/2019   Depression 04/25/2017   Diabetes mellitus due to underlying condition with unspecified complications (HCC) 09/05/2019   Diabetes mellitus with stage 4 chronic kidney disease GFR 15-29 (HCC) 07/20/2022   Essential  hypertension 09/05/2019   Ex-smoker 09/05/2019   Family history of coronary artery disease 09/05/2019   History of depression 04/15/2020   Hx of diabetes mellitus 04/15/2020   Hyperlipidemia with target LDL less than 70 11/24/2019   Hyperphosphatemia 07/20/2022   Hypertension    Hypokalemia 04/30/2022   Hypothyroid    Migraine 04/25/2017   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   Syncope and collapse 04/29/2022   Thyroid disease    UTI (urinary tract infection) 04/30/2022    Past Surgical History:  Procedure Laterality Date   ABDOMINAL HYSTERECTOMY     APPENDECTOMY     BIOPSY  12/23/2022   Procedure: BIOPSY;  Surgeon: Corbin Ade, MD;  [facility]: [facility];  Service: Endoscopy;;   CARDIAC CATHETERIZATION  11/22/2019   CHOLECYSTECTOMY     COLONOSCOPY     COLONOSCOPY WITH PROPOFOL N/A 12/25/2022   Procedure: COLONOSCOPY WITH PROPOFOL;  Surgeon: Dolores Frame, MD;  Location: [facility];  Service: Gastroenterology;  Laterality: N/A;   CORONARY ARTERY BYPASS GRAFT N/A 09/15/2019   Procedure: CORONARY ARTERY BYPASS GRAFTING (CABG) times three on pump using left internal mammary artery and right and left greater saphenous veins harvested endoscopically;  Surgeon: Alleen Borne, MD;  Location: [facility];  Service: Open Heart Surgery;  Laterality: N/A;   CORONARY STENT INTERVENTION N/A 11/23/2019   Procedure: CORONARY STENT INTERVENTION;  Surgeon: Lennette Bihari, MD;  Location: [facility];  Service: Cardiovascular;  Laterality: N/A;   ESOPHAGEAL DILATION N/A 12/23/2022   Procedure: ESOPHAGEAL DILATION;  Surgeon: Corbin Ade, MD;  Location: [facility];  Service: Endoscopy;  Laterality: N/A;   ESOPHAGOGASTRODUODENOSCOPY (EGD) WITH PROPOFOL N/A 12/23/2022   Procedure: ESOPHAGOGASTRODUODENOSCOPY (EGD) WITH PROPOFOL;  Surgeon: Corbin Ade, MD;  [facility]: [facility];  Service: Endoscopy;  Laterality: N/A;   KNEE ARTHROSCOPY     LEFT HEART CATH AND CORONARY ANGIOGRAPHY N/A 09/14/2019   Procedure: LEFT HEART CATH AND CORONARY ANGIOGRAPHY;  Surgeon: Corky Crafts, MD;  Location: [facility] INVASIVE CV LAB;  Service: Cardiovascular;  Laterality: N/A;    LEFT HEART CATH AND CORS/GRAFTS ANGIOGRAPHY N/A 11/22/2019   Procedure: LEFT HEART CATH AND CORS/GRAFTS ANGIOGRAPHY;  Surgeon: Lennette Bihari, MD;  Location: [facility];  Service: Cardiovascular;  Laterality: N/A;   LEFT HEART CATH AND CORS/GRAFTS ANGIOGRAPHY N/A 03/13/2020   Procedure: LEFT HEART CATH AND CORS/GRAFTS ANGIOGRAPHY;  Surgeon: Runell Gess, MD;  Location: [facility];  Service: Cardiovascular;  Laterality: N/A;   LEFT HEART CATH AND CORS/GRAFTS ANGIOGRAPHY N/A 12/31/2020   Procedure: LEFT HEART CATH AND CORS/GRAFTS ANGIOGRAPHY;  Surgeon: Lyn Records, MD;  Location: [facility];  Service: Cardiovascular;  Laterality: N/A;   OSTEOCHONDRAL DEFECT REPAIR/RECONSTRUCTION Left 11/29/2014   Procedure: LEFT ANKLE MEDIAL MALLEOLUS OSTEOTOMY,AUTO GRAFT FROM CALCANEOUS;  OS TIBIA DENORO GRAFTING TALUS;  Surgeon: Toni Arthurs, MD;  Location: [facility];  Service: Orthopedics;  Laterality: Left;   POLYPECTOMY  12/25/2022   Procedure: POLYPECTOMY;  Surgeon: Dolores Frame, MD;  [facility];  Service: Gastroenterology;;   TEE WITHOUT CARDIOVERSION N/A 09/15/2019  Procedure: TRANSESOPHAGEAL ECHOCARDIOGRAM (TEE);  Surgeon: Alleen Borne, MD;  Location: [facility] OR;  Service: Open Heart Surgery;  Laterality: N/A;   TUBAL LIGATION       Home Medications:  Prior to Admission medications   Medication Sig Start Date End Date Taking? Authorizing Provider  acetaminophen (TYLENOL) 500 MG tablet Take 2 tablets by mouth 3 times daily as needed for pain 12/08/21     aspirin EC 81 MG tablet Take 1 tablet (81 mg total) by mouth daily. 09/05/19   Revankar, Aundra Dubin, MD  buPROPion (WELLBUTRIN XL) 300 MG 24 hr tablet Take 1 tablet (300 mg total) by mouth daily. 07/06/22     cholecalciferol 25 MCG (1000 UT) tablet Take 1 tablet (1,000 Units total) by mouth in the morning and at bedtime. 12/25/22   Johnson, Clanford L, MD  clopidogrel (PLAVIX) 75 MG tablet TAKE 1  TABLET BY MOUTH DAILY 03/30/22   Revankar, Aundra Dubin, MD  cyanocobalamin (VITAMIN B12) 1000 MCG tablet Take 1 tablet (1,000 mcg total) by mouth daily. 12/25/22   Johnson, Clanford L, MD  dapagliflozin propanediol (FARXIGA) 10 MG TABS tablet Take 1 tablet (10 mg total) by mouth daily before breakfast. 12/26/22   Johnson, Clanford L, MD  ferrous sulfate 325 (65 FE) MG EC tablet Take 325 mg by mouth 2 (two) times daily.    [provider]  folic acid (FOLVITE) 1 MG tablet Take 1 tablet (1 mg total) by mouth daily. 12/26/22   Johnson, Clanford L, MD  gabapentin (NEURONTIN) 100 MG capsule Take 3 capsules (300 mg total) by mouth 3 (three) times daily as needed. 02/02/23   Levert Feinstein, MD  levothyroxine (SYNTHROID) 50 MCG tablet Take 1 tablet (50 mcg total) by mouth See admin instructions. Take 1 tablet in the morning with breakfast on SATURDAY AND SUNDAY. 02/21/23   Cleora Fleet, MD  levothyroxine (SYNTHROID) 75 MCG tablet Take 1 tablet (75 mcg total) by mouth daily Monday thru Friday. 06/08/22     linaclotide (LINZESS) 145 MCG CAPS capsule Take 1 capsule (145 mcg total) by mouth daily before breakfast. 01/21/23   Aida Raider, NP  metolazone (ZAROXOLYN) 5 MG tablet Take 1 tablet (5 mg total) by mouth 3 (three) times a week. 02/24/23   Mallipeddi, Vishnu P, MD  metoprolol succinate (TOPROL-XL) 25 MG 24 hr tablet Take 0.5 tablets (12.5 mg total) by mouth at bedtime. Take with or immediately following a meal. 12/25/22   Johnson, Clanford L, MD  nitroGLYCERIN (NITROSTAT) 0.4 MG SL tablet Place 1 tablet (0.4 mg total) under the tongue every 5 (five) minutes as needed for chest pain. 12/29/21   Revankar, Aundra Dubin, MD  ondansetron (ZOFRAN) 4 MG tablet take 1 tablet (4 mg) by mouth 2 times per day as needed for nausea 05/12/22     pantoprazole (PROTONIX) 40 MG tablet Take 1 tablet (40 mg total) by mouth 2 (two) times daily. 12/25/22   Johnson, Clanford L, MD  Polyvinyl Alcohol-Povidone (REFRESH OP) Apply 1 drop to  eye 4 (four) times daily.    [provider]  Potassium Chloride ER 20 MEQ TBCR Take 40 mEq by mouth 2 (two) times daily.    [provider]  promethazine (PHENERGAN) 25 MG tablet Take 1 tablet (25 mg total) by mouth every 6 (six) hours as needed for nausea or vomiting. 12/25/22   Laural Benes, Clanford L, MD  ranolazine (RANEXA) 1000 MG SR tablet Take 1 tablet (1,000 mg total) by mouth 2 (  two) times daily. 02/04/23   Mallipeddi, Vishnu P, MD  rOPINIRole (REQUIP) 4 MG tablet Take 1 tablet (4 mg total) by mouth 1 to 3 hours before bedtime. 06/29/22     rosuvastatin (CRESTOR) 40 MG tablet Take 1 tablet (40 mg total) by mouth at bedtime. 12/25/22   Johnson, Clanford L, MD  topiramate (TOPAMAX) 100 MG tablet TAKE 1 TABLET BY MOUTH TWICE DAILY. 05/12/22     torsemide (DEMADEX) 20 MG tablet Take 3 tablets (60 mg total) by mouth 2 (two) times daily. 02/24/23   Mallipeddi, Vishnu P, MD  traMADol (ULTRAM) 50 MG tablet Take 1 tablet (50 mg total) by mouth daily as needed for moderate pain. 02/21/23   Johnson, Clanford L, MD  traZODone (DESYREL) 100 MG tablet Take 150 mg by mouth at bedtime.    [provider]    Inpatient Medications: Scheduled Meds:  aspirin EC  81 mg Oral Daily   buPROPion  300 mg Oral Daily   clopidogrel  75 mg Oral Daily   furosemide  80 mg Intravenous BID   heparin  5,000 Units Subcutaneous Q12H   insulin aspart  0-9 Units Subcutaneous TID WC   [START ON 03/20/2023] levothyroxine  50 mcg Oral Once per day on Sun Sat   levothyroxine  75 mcg Oral Once per day on Mon Tue Wed Thu Fri   linaclotide  145 mcg Oral QAC breakfast   metolazone  2.5 mg Oral Daily   metoprolol succinate  12.5 mg Oral QHS   pantoprazole  40 mg Oral BID   potassium chloride  40 mEq Oral BID   ranolazine  1,000 mg Oral BID   rOPINIRole  4 mg Oral QHS   rosuvastatin  40 mg Oral QHS   sodium chloride flush  3 mL Intravenous Q12H   topiramate  100 mg Oral BID   traZODone  150 mg Oral QHS    Continuous Infusions:  sodium chloride     PRN Meds: sodium chloride, acetaminophen, gabapentin, ondansetron (ZOFRAN) IV, ondansetron, sodium chloride flush, traMADol  Allergies:    Allergies  Allergen Reactions   Abilify [Aripiprazole]    Ciprofloxacin Nausea And Vomiting   Carbidopa-Levodopa Other (See Comments)    Developed tics while taking    Prednisone Other (See Comments)    Turns red all over    Venlafaxine Other (See Comments)    Developed tics while taking - reaction to Effexor     Social History:   Social History   Socioeconomic History   Marital status: Married    Spouse name: Weronika Ertl   Number of children: 2   Years of education: Not on file   Highest education level: Associate degree: occupational, Scientist, product/process development, or vocational program  Occupational History   Occupation: disabilty    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  Tobacco Use   Smoking status: Former    Packs/day: 1.00    Years: 47.00    Additional pack years: 0.00    Total pack years: 47.00    Types: Cigarettes    Quit date: 09/15/2019    Years since quitting: 3.5    Passive exposure: Past   Smokeless tobacco: Never  Vaping Use   Vaping Use: Every day   Start date: 09/15/2019   Substances: Nicotine  Substance and Sexual Activity   Alcohol use: No   Drug use: No   Sexual activity: Not on file  Other Topics Concern   Not on file  Social History Narrative  Not on file   Social Determinants of Health   Financial Resource Strain: High Risk (06/19/2021)   Overall Financial Resource Strain (CARDIA)    Difficulty of Paying Living Expenses: Very hard  Food Insecurity: No Food Insecurity (03/17/2023)   Hunger Vital Sign    Worried About Running Out of Food in the Last Year: Never true    Ran Out of Food in the Last Year: Never true  Transportation Needs: No Transportation Needs (03/17/2023)   PRAPARE - Administrator, Civil Service (Medical): No    Lack of Transportation  (Non-Medical): No  Physical Activity: Not on file  Stress: Not on file  Social Connections: Not on file  Intimate Partner Violence: Not At Risk (03/17/2023)   Humiliation, Afraid, Rape, and Kick questionnaire    Fear of Current or Ex-Partner: No    Emotionally Abused: No    Physically Abused: No    Sexually Abused: No    Family History:    Family History  Problem Relation Age of Onset   Asthma Mother    COPD Mother    Diabetes Mother    Macular degeneration Mother    Congestive Heart Failure Father      ROS:  Please see the history of present illness.   All other ROS reviewed and negative.     Physical Exam/Data:   Vitals:   03/17/23 2300 03/18/23 0023 03/18/23 0438 03/18/23 0836  BP: (!) 80/40 (!) 84/50 (!) 89/48 (!) 110/53  Pulse: 70  74 76  Resp:      Temp: 98.3 F (36.8 C)  98.4 F (36.9 C) 98 F (36.7 C)  TempSrc: Oral  Oral Oral  SpO2: 100%  100% 98%  Weight:   65 kg   Height:        Intake/Output Summary (Last 24 hours) at 03/18/2023 0929 Last data filed at 03/18/2023 0700 Gross per 24 hour  Intake 480 ml  Output 1000 ml  Net -520 ml      03/18/2023    4:38 AM 03/17/2023    8:21 PM 03/17/2023    4:51 PM  Last 3 Weights  Weight (lbs) 143 lb 4.8 oz 145 lb 11.6 oz 150 lb  Weight (kg) 65 kg 66.1 kg 68.04 kg     Body mass index is 25.38 kg/m.  General:  Well nourished, well developed female appearing in no acute distress HEENT: normal Neck: JVD at 9-10 cm.  Vascular: No carotid bruits; Distal pulses 2+ bilaterally Cardiac:  normal S1, S2; RRR; no murmur  Lungs: decreased breath sounds along bases bilaterally. Mild rales along right base.   Abd: soft, nontender, no hepatomegaly  Ext: 2+ pitting edema bilaterally.  Musculoskeletal:  No deformities, BUE and BLE strength normal and equal Skin: warm and dry  Neuro:  CNs 2-12 intact, no focal abnormalities noted Psych:  Normal affect   EKG:  The EKG was personally reviewed and demonstrates: NSR, HR 82  with no acute ST changes.   Relevant CV Studies:  Echocardiogram: 11/2022 IMPRESSIONS     1. Left ventricular ejection fraction, by estimation, is 60 to 65%. The  left ventricle has normal function. The left ventricle has no regional  wall motion abnormalities. Left ventricular diastolic parameters are  consistent with Grade II diastolic  dysfunction (pseudonormalization).   2. Right ventricular systolic function is normal. The right ventricular  size is normal. There is normal pulmonary artery systolic pressure. The  estimated right ventricular systolic pressure is  19.0 mmHg.   3. Left atrial size was mildly dilated.   4. The mitral valve is grossly normal. Mild mitral valve regurgitation.   5. The aortic valve is tricuspid. There is mild calcification of the  aortic valve. Aortic valve regurgitation is not visualized. Aortic valve  sclerosis is present, with no evidence of aortic valve stenosis.   6. The inferior vena cava is normal in size with greater than 50%  respiratory variability, suggesting right atrial pressure of 3 mmHg.   Comparison(s): Prior images reviewed side by side. LVEF remains normal  range at 60-65%. RV contraction is normal.   Laboratory Data:  High Sensitivity Troponin:  No results for input(s): "TROPONINIHS" in the last 720 hours.   Chemistry Recent Labs  Lab 03/12/23 1520 03/17/23 1703 03/18/23 0420  NA 134* 133* 137  K 4.0 3.5 3.1*  CL 96* 90* 91*  CO2 27 31 32  GLUCOSE 154* 100* 86  BUN 39* 46* 48*  CREATININE 2.60* 2.82* 2.88*  CALCIUM 8.6* 8.7* 8.9  GFRNONAA 20* 18* 18*  ANIONGAP 11 12 14     No results for input(s): "PROT", "ALBUMIN", "AST", "ALT", "ALKPHOS", "BILITOT" in the last 168 hours. Lipids No results for input(s): "CHOL", "TRIG", "HDL", "LABVLDL", "LDLCALC", "CHOLHDL" in the last 168 hours.  Hematology Recent Labs  Lab 03/17/23 1703  WBC 8.4  RBC 3.48*  HGB 10.2*  HCT 32.7*  MCV 94.0  MCH 29.3  MCHC 31.2  RDW 13.5   PLT 243   Thyroid  Recent Labs  Lab 03/17/23 1703  TSH 6.955*    BNP Recent Labs  Lab 03/12/23 1520 03/17/23 1703  BNP 118.0* 105.0*    DDimer No results for input(s): "DDIMER" in the last 168 hours.   Radiology/Studies:  DG Chest 2 View  Result Date: 03/17/2023 CLINICAL DATA:  Weakness.  Chest pain EXAM: CHEST - 2 VIEW COMPARISON:  X-ray 02/18/2023 FINDINGS: Status post median sternotomy. Normal cardiopericardial silhouette. No consolidation, pneumothorax or effusion. Mild linear changes at the lung bases. Atelectasis or scar is favored. Eventration of the right hemidiaphragm. Degenerative changes along the spine. Surgical changes in the upper abdomen. IMPRESSION: Postop chest. Chronic lung changes. Mild basilar atelectasis or scar. Electronically Signed   By: Karen Kays M.D.   On: 03/17/2023 17:24     Assessment and Plan:   1. Acute HFpEF - Most recent echocardiogram in 11/2022 showed a preserved EF of 60 to 65% with no wall motion abnormalities. She did have grade 2 diastolic dysfunction but RV function was normal and she had normal PASP. - BNP mildly elevated at 105 on admission. She is already responding nicely to IV Lasix and Nephrology has been consulted to help assist with dosing. At this time, she remains on IV Lasix 80 mg twice daily along with Metolazone 2.5 mg daily. Would restart her SGLT2 inhibitor prior to discharge.  2. CAD - She is s/p CABG in 2020 with LIMA-LAD, SVG-OM and SVG-PDA, prior subtotal occlusion of PDA which was felt to be due to spasm, DESx3 to RCA in 10/2019 and cath in 12/2020 showing patent grafts and medical management recommended.  - Continue ASA, Toprol-XL and Ranexa. She was continued on DAPT at the time of her last office visit but unclear if she needs to remain on Plavix at this time given her recent admission for anemia in 11/2022.  3. HTN - BP has been soft, at 110/53 on most recent check. This has limited medication titration. Remains  on  Toprol-XL 12.5mg  daily.   4. HLD - She has been continued on Crestor 40mg  daily.   5. Stage 4 CKD - Followed by Dr. Wolfgang Phoenix as an outpatient. Creatinine variable from 2.2 to 3.3 within the past month, stable at 2.88 today.    For questions or updates, please contact [facility] Please consult [email or web address] for contact info under    Signed, Ellsworth Lennox, PA-C  03/18/2023 9:29 AM   Attending note:  Patient seen and examined.  I reviewed her records and discussed the case with Ms. Patrick Jupiter, agree with her above findings.  Ms. Gierke presents with fluid overload, leg swelling and at least a 10 pound weight gain despite escalating outpatient diuretic dosing.  She has HFpEF with echocardiogram in March demonstrating LVEF 60 to 65%, normal RV contraction and estimated RVSP at that time.  CKD stage IV complicates the situation as well.  She reports compliance with fluid and sodium restriction as well as her medications.  On examination she appears comfortable at rest.  Afebrile, heart rate in the 70s in sinus rhythm by telemetry, blood pressure 110/53.  She is diuresed at least 500 cc so far on IV Lasix.  Lungs exhibit no crackles, scattered rhonchi at the bases.  Cardiac exam with RRR and no gallop.  She does have peripheral edema.  Pertinent lab work includes potassium 3.1, BUN 40, creatinine 2.88, BNP 105, hemoglobin 10.2, platelets 243, TSH 6.955.  Chest x-ray shows chronic lung changes with mild bibasilar atelectasis versus scar.  No effusions or edema.  ECG shows normal sinus rhythm.  Acute on chronic HFpEF with fluid overload.  Also complicated by CKD stage IV.  Nephrology consulted by primary team, recommendations reviewed.  She is currently on Lasix 80 mg IV twice daily with metolazone 2.5 mg daily and beginning to diurese reasonably well.  Marcelline Deist on hold temporarily.  Jonelle Sidle, M.D., F.A.C.C.

## 2023-03-18 NOTE — TOC Initial Note (Signed)
Transition of Care [facility]) - Initial/Assessment Note    Patient Details  Name: Meghan Terry MRN: [id number] Date of Birth: Apr 24, 1961  Transition of Care [facility]) CM/SW Contact:    Leitha Bleak, RN Phone Number: 03/18/2023, 2:25 PM  Clinical Narrative:           Patient admitted with CHF. Patient has a high risk of readmission. CM spoke with patient she now lives with her daughter. Her husband moved to [place] to live with family, she could no longer take care of him. She drives as needed. Patient is active with [facility] RN for CHF. She does not feel like she needs to continue. No other needs identified. TOC following.     Expected Discharge Plan: Home/Self Care Barriers to Discharge: Continued Medical Work up  Patient Goals and CMS Choice Patient states their goals for this hospitalization and ongoing recovery are:: to go home. CMS Medicare.gov Compare Post Acute Care list provided to:: Patient Choice offered to / list presented to : Patient     Expected Discharge Plan and Services        Prior Living Arrangements/Services   Lives with:: Adult Children     Activities of Daily Living Home Assistive Devices/Equipment: None ADL Screening (condition at time of admission) Patient's cognitive ability adequate to safely complete daily activities?: Yes Is the patient deaf or have difficulty hearing?: No Does the patient have difficulty seeing, even when wearing glasses/contacts?: No Does the patient have difficulty concentrating, remembering, or making decisions?: No Patient able to express need for assistance with ADLs?: Yes Does the patient have difficulty dressing or bathing?: No Independently performs ADLs?: Yes (appropriate for developmental age) Does the patient have difficulty walking or climbing stairs?: No Weakness of Legs: None Weakness of Arms/Hands: None  Permission Sought/Granted         Emotional Assessment     Affect (typically observed):  Accepting Orientation: : Oriented to Self, Oriented to Place, Oriented to  Time, Oriented to Situation Alcohol / Substance Use: Not Applicable Psych Involvement: No (comment)  Admission diagnosis:  Anasarca [R60.1] CHF (congestive heart failure) (HCC) [I50.9] Patient Active Problem List   Diagnosis Date Noted   Acute on chronic diastolic CHF (congestive heart failure) (HCC) 02/18/2023   Confusion 02/02/2023   Cognitive impairment 02/02/2023   History of traumatic head injury 02/02/2023   Acute on chronic diastolic (congestive) heart failure (HCC) 01/25/2023   IDA (iron deficiency anemia) 12/23/2022   Symptomatic anemia 12/21/2022   Diabetic nephropathy associated with type 2 diabetes mellitus (HCC) 08/10/2022   Anemia due to stage 4 chronic kidney disease (HCC) 07/20/2022   Hyperphosphatemia 07/20/2022   Prerenal azotemia 07/20/2022   Benign hypertension with CKD (chronic kidney disease) stage IV (HCC) 07/20/2022   Diabetes mellitus with stage 4 chronic kidney disease GFR 15-29 (HCC) 07/20/2022   CKD (chronic kidney disease) stage 4, GFR 15-29 ml/min (HCC) 05/01/2022   UTI (urinary tract infection) 04/30/2022   Hypokalemia 04/30/2022   AKI on CKD IV 04/30/2022   Syncope and collapse 04/29/2022   Acute diastolic CHF (congestive heart failure) (HCC) 06/08/2021   HFpEF 06/08/2021   Acute pulmonary edema (HCC)    RLS (restless legs syndrome)    Anxiety    Coronary artery disease    Hypertension    Thyroid disease    Atypical chest pain 12/30/2020   Restless legs syndrome 04/15/2020   History of depression 04/15/2020   Hx of diabetes mellitus 04/15/2020   CKD (chronic kidney disease) stage  3b, GFR 30-59 ml/min 12/03/2019   Hyperlipidemia with target LDL less than 70 11/24/2019   Presence of drug coated stent in right coronary artery 11/24/2019   Angina pectoris (HCC) 11/17/2019   Coronary artery disease involving native coronary artery of native heart with angina pectoris (HCC)  10/03/2019   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   Essential hypertension 09/05/2019   Diabetes mellitus due to underlying condition with unspecified complications (HCC) 09/05/2019   Ex-smoker 09/05/2019   Family history of coronary artery disease 09/05/2019   Migraine 04/25/2017   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   Hypothyroid 04/25/2017   Depression 04/25/2017   Pyelonephritis 05/20/2016   PCP:  Benita Stabile, MD Pharmacy:   [facility] - [place], [place] - [address] [place] [address] Phone: [phone] Fax: [phone]  [facility] - [place], [place] - [address] [place] [address] Phone: [phone] Fax: [phone]  Social Determinants of Health (SDOH) Social History: SDOH Screenings   Food Insecurity: No Food Insecurity (03/17/2023)  Housing: Low Risk  (03/17/2023)  Transportation Needs: No Transportation Needs (03/17/2023)  Utilities: Not At Risk (03/17/2023)  Alcohol Screen: Low Risk  (06/10/2021)  Financial Resource Strain: High Risk (06/19/2021)  Tobacco Use: Medium Risk (03/17/2023)   SDOH Interventions:    Readmission Risk Interventions     No data to display

## 2023-03-18 NOTE — Progress Notes (Signed)
Blood pressure 84/50 manual. MD on call notified. Patient is asymptomatic and stated, " this is my normal at night, my blood pressure goes down".  No new orders.

## 2023-03-18 NOTE — Progress Notes (Signed)
Notified Dr. Jarvis Newcomer of patient's bp 88/52. Orders adjusted. Patient instructed to not get up without assist until we say differently. Expressed understanding.

## 2023-03-18 NOTE — Progress Notes (Signed)
[facility] PROGRESS NOTE  Meghan Terry (DOB: [birth year]) ZOX:[id number] PCP: Benita Stabile, MD  Brief Narrative: Meghan Terry is a 62 y.o. female with a history of chronic HFpEF with frequent admissions for exacerbation, cor pulmonale with mild reduced RV function, CAD status post CABG in 11/2018 and PCI, CKD stage IV, T2DM, HTN, HLD, and RLS who presented to the ED on 03/17/2023 with recurrent peripheral swelling, readmitted for acute on chronic HFpEF despite upward titration of torsemide and adherence to metolazone as an outpatient. IV lasix has been initiated and cardiology and nephrology consultations have been sought.   Subjective: Feels her swelling throughout her body including legs, abdomen, UE's and face. No shortness of breath currently. No chest pain recently or currently. Adherent to medications.  Objective: BP (!) 110/53 (BP Location: Left Arm)   Pulse 76   Temp 98 F (36.7 C) (Oral)   Resp 17   Ht 5\' 3"  (1.6 m)   Wt 65 kg   SpO2 98%   BMI 25.38 kg/m   Gen: Pleasant, no distress Pulm: Clear, nonlabored  CV: RRR, no MRG, diffuse edema GI: Soft, NT, ND, +BS Neuro: Alert and oriented. No new focal deficits. Ext: Warm, no deformities. Skin: No rashes, lesions or ulcers on visualized skin   Assessment & Plan: Acute on chronic HFpEF: Recurrent. Difficult situation with advanced CKD and CHF. Diuretics have been managed by HF clinic and nephrology as outpatient.  - I have enlisted the assistance of nephrology and cardiology given her complexity. Will continue IV diuresis pending their recommendations. Down 1L UOP documented with stable Cr this AM on 80mg  dose, was taking 100mg  torsemide BID and metolazone 5mg  thrice weekly. Was placed on 2.5mg  daily here.  - ?if needs RHC.    CKD stage IV: Followed by Dr. Wolfgang Phoenix. -Creatinine level stable, continue monitoring with diuresis. No indication for dialysis at this time.    CAD s/p CABG: No anginal complaints, no ischemic ECG  changes at admission.  -Continue ASA, plavix, metoprolol, rosuvastatin, ranolazine    NIDT2DM:  - Hold farxiga given poor CrCl. The indication for this may be CHF as her last HbA1c was 4.9%.  - SSI while admitted   Anemia on CKD - H&H stable, no overt symptoms signs of bleeding  Tyrone Nine, MD [facility] [email or web address] 03/18/2023, 9:44 AM

## 2023-03-19 ENCOUNTER — Encounter (HOSPITAL_COMMUNITY)
Admission: RE | Admit: 2023-03-19 | Discharge: 2023-03-19 | Disposition: A | Discharge status: Home / Self Care | Payer: Medicare Other | Source: Ambulatory Visit | Attending: Nephrology | Admitting: Nephrology

## 2023-03-19 DIAGNOSIS — R601 Generalized edema: Secondary | ICD-10-CM | POA: Diagnosis not present

## 2023-03-19 DIAGNOSIS — I5033 Acute on chronic diastolic (congestive) heart failure: Secondary | ICD-10-CM | POA: Diagnosis not present

## 2023-03-19 DIAGNOSIS — N184 Chronic kidney disease, stage 4 (severe): Secondary | ICD-10-CM | POA: Diagnosis not present

## 2023-03-19 DIAGNOSIS — E1121 Type 2 diabetes mellitus with diabetic nephropathy: Secondary | ICD-10-CM | POA: Diagnosis not present

## 2023-03-19 LAB — BASIC METABOLIC PANEL
Anion gap: 11 (ref 5–15)
BUN: 53 mg/dL — ABNORMAL HIGH (ref 8–23)
CO2: 31 mmol/L (ref 22–32)
Calcium: 8.4 mg/dL — ABNORMAL LOW (ref 8.9–10.3)
Chloride: 91 mmol/L — ABNORMAL LOW (ref 98–111)
Creatinine, Ser: 3.08 mg/dL — ABNORMAL HIGH (ref 0.44–1.00)
GFR, Estimated: 16 mL/min — ABNORMAL LOW (ref >60)
Glucose, Bld: 123 mg/dL — ABNORMAL HIGH (ref 70–99)
Potassium: 3 mmol/L — ABNORMAL LOW (ref 3.5–5.1)
Sodium: 133 mmol/L — ABNORMAL LOW (ref 135–145)

## 2023-03-19 LAB — GLUCOSE, CAPILLARY
Glucose-Capillary: 116 mg/dL — ABNORMAL HIGH (ref 70–99)
Glucose-Capillary: 170 mg/dL — ABNORMAL HIGH (ref 70–99)
Glucose-Capillary: 87 mg/dL (ref 70–99)
Glucose-Capillary: 135 mg/dL — ABNORMAL HIGH (ref 70–99)

## 2023-03-19 LAB — PHOSPHORUS: Phosphorus: 6.1 mg/dL — ABNORMAL HIGH (ref 2.5–4.6)

## 2023-03-19 LAB — FERRITIN: Ferritin: 14 ng/mL (ref 11–307)

## 2023-03-19 LAB — IRON AND TIBC
Iron: 31 ug/dL (ref 28–170)
Saturation Ratios: 9 % — ABNORMAL LOW (ref 10.4–31.8)
TIBC: 332 ug/dL (ref 250–450)
UIBC: 301 ug/dL

## 2023-03-19 MED ORDER — GABAPENTIN 100 MG PO CAPS
100.0000 mg | ORAL_CAPSULE | Freq: Three times a day (TID) | ORAL | Status: DC | PRN
  Administered 2023-03-19: 100 mg via ORAL
  Filled 2023-03-19: qty 1

## 2023-03-19 MED ORDER — SODIUM CHLORIDE 0.9 % IV SOLN
250.0000 mg | Freq: Every day | INTRAVENOUS | Status: DC
  Administered 2023-03-19 – 2023-03-20 (×2): 250 mg via INTRAVENOUS
  Filled 2023-03-19: qty 250
  Filled 2023-03-19: qty 20
  Filled 2023-03-19: qty 250

## 2023-03-19 MED ORDER — TORSEMIDE 20 MG PO TABS
100.0000 mg | ORAL_TABLET | Freq: Two times a day (BID) | ORAL | Status: DC
  Administered 2023-03-19: 100 mg via ORAL
  Filled 2023-03-19 (×2): qty 5

## 2023-03-19 NOTE — Progress Notes (Signed)
   03/19/23 0800  ReDS Vest / Clip  Station Marker A  Ruler Value 30  ReDS Value Range < 36  ReDS Actual Value 25

## 2023-03-19 NOTE — Progress Notes (Signed)
Patient concerned regarding hand swelling, did not appear swollen. Patient stated that her rings feel tighter. Placed pillows under patient hand. MD Gwenlyn Perking made aware. Removed patients rings (four rings total) placed in patients bag per patient request.

## 2023-03-19 NOTE — Progress Notes (Signed)
Subjective:  2500 of UOP -  weight trending down on current regimen-  BUN /crt worse-  she is alert, sitting up in bed and ate a full breakfast   Objective Vital signs in last 24 hours: Vitals:   03/18/23 1553 03/18/23 2111 03/19/23 0005 03/19/23 0400  BP: (!) 95/55 93/60 (!) 97/57 (!) 102/57  Pulse:  81 68 77  Resp:  19    Temp:  98.4 F (36.9 C) 98.4 F (36.9 C) 97.7 F (36.5 C)  TempSrc:  Oral Oral Oral  SpO2:  97% 100% 100%  Weight:    64.2 kg  Height:       Weight change: -3.839 kg  Intake/Output Summary (Last 24 hours) at 03/19/2023 0740 Last data filed at 03/19/2023 0430 Gross per 24 hour  Intake 480 ml  Output 2500 ml  Net -2020 ml    Assessment/ Plan: Pt is a 62 y.o. yo female with DM, HTN, CAD, dCHF and CKD who was admitted on 03/17/2023 with volume overload  Assessment/Plan: 1. Renal-  crt 1.7 in April of 24-  been up over 2 since.  Followed by Wolfgang Phoenix as OP-  now with A on CRF in the setting of volume overload-  urine bland-  likely cardiorenal syndrome.  Trending worse for now but no absolute dialysis indications-  ate full breakfast. She seems knowledgeable about her situation.  I am changing diuretics to po-  if numbers at least stable tomorrow I would be ok with discharge and close follow up as OP  2. HTN/volume-  billed as really overloaded-  I do not really detect any fluid on exam today - she says weight is down close to her goal ( 141 vs 139)   I am going to stop the lasix and metolazone-  start torsemide tonight-  will give her 100 mg dose but that even might be overkill-  there may be an element of anxiety due to edema here.  Her BP is soft which isnt helping-  only on low dose lopressor-  I did decrease her neurontin as not appropriate for such a low GFR  3. Anemia- hgb over 10 -  iron low when last checked-  I  will dose with IV iron while here 4. Hypokalemia-  repletion has been ordered -  total of 80 today -  with dec in diuretics we should see bump tomorrow    Pt may not be physically seen over the weekend-  Dr. Signe Colt will be on call-  she will look at labs and make recommendations based on them    Cecille Aver    Labs: Basic Metabolic Panel: Recent Labs  Lab 03/17/23 1703 03/18/23 0420 03/19/23 0408  NA 133* 137 133*  K 3.5 3.1* 3.0*  CL 90* 91* 91*  CO2 31 32 31  GLUCOSE 100* 86 123*  BUN 46* 48* 53*  CREATININE 2.82* 2.88* 3.08*  CALCIUM 8.7* 8.9 8.4*  PHOS  --   --  6.1*   Liver Function Tests: No results for input(s): "AST", "ALT", "ALKPHOS", "BILITOT", "PROT", "ALBUMIN" in the last 168 hours. No results for input(s): "LIPASE", "AMYLASE" in the last 168 hours. No results for input(s): "AMMONIA" in the last 168 hours. CBC: Recent Labs  Lab 03/17/23 1703  WBC 8.4  HGB 10.2*  HCT 32.7*  MCV 94.0  PLT 243   Cardiac Enzymes: No results for input(s): "CKTOTAL", "CKMB", "CKMBINDEX", "TROPONINI" in the last 168 hours. CBG: Recent Labs  Lab 03/18/23  8295 03/18/23 1103 03/18/23 1628 03/18/23 2110 03/19/23 0733  GLUCAP 82 171* 157* 112* 116*    Iron Studies:  Recent Labs    03/19/23 0408  IRON 31  TIBC 332  FERRITIN 14   Studies/Results: US RENAL  Addendum Date: 03/18/2023   ADDENDUM REPORT: 03/18/2023 12:45 Electronically Signed   By: Lupita Raider M.D.   On: 03/18/2023 12:45   Result Date: 03/18/2023 CLINICAL DATA:  Acute on chronic kidney disease. EXAM: RENAL / URINARY TRACT ULTRASOUND COMPLETE COMPARISON:  January 21, 2023. FINDINGS: Right Kidney: Renal measurements: 8.5 x 3.8 x 3.8 cm = volume: 66 mL. Echogenicity within normal limits. No mass or hydronephrosis visualized. Left Kidney: Renal measurements: 9.5 x 4.7 x 4.4 cm = volume: 104 mL. Echogenicity within normal limits. No mass or hydronephrosis visualized. Bladder: Appears normal for degree of bladder distention. Bilateral ureteral jets are noted. Other: None. IMPRESSION: No significant renal abnormality seen. Electronically Signed: By:  Lupita Raider M.D. On: 03/18/2023 12:43   DG Chest 2 View  Result Date: 03/17/2023 CLINICAL DATA:  Weakness.  Chest pain EXAM: CHEST - 2 VIEW COMPARISON:  X-ray 02/18/2023 FINDINGS: Status post median sternotomy. Normal cardiopericardial silhouette. No consolidation, pneumothorax or effusion. Mild linear changes at the lung bases. Atelectasis or scar is favored. Eventration of the right hemidiaphragm. Degenerative changes along the spine. Surgical changes in the upper abdomen. IMPRESSION: Postop chest. Chronic lung changes. Mild basilar atelectasis or scar. Electronically Signed   By: Karen Kays M.D.   On: 03/17/2023 17:24   Medications: Infusions:  sodium chloride      Scheduled Medications:  aspirin EC  81 mg Oral Daily   buPROPion  300 mg Oral Daily   clopidogrel  75 mg Oral Daily   furosemide  80 mg Intravenous BID   heparin  5,000 Units Subcutaneous Q12H   insulin aspart  0-9 Units Subcutaneous TID WC   [START ON 03/20/2023] levothyroxine  50 mcg Oral Once per day on Sun Sat   levothyroxine  75 mcg Oral Once per day on Mon Tue Wed Thu Fri   linaclotide  145 mcg Oral QAC breakfast   metolazone  2.5 mg Oral Daily   metoprolol succinate  12.5 mg Oral QHS   pantoprazole  40 mg Oral BID   potassium chloride  40 mEq Oral BID   ranolazine  1,000 mg Oral BID   rOPINIRole  4 mg Oral QHS   rosuvastatin  40 mg Oral QHS   sodium chloride flush  3 mL Intravenous Q12H   topiramate  100 mg Oral BID   traZODone  150 mg Oral QHS    have reviewed scheduled and prn medications.  Physical Exam: General: NAD Heart: RRR Lungs: clear Abdomen: soft, non tender Extremities: no edema    03/19/2023,7:40 AM  LOS: 2 days

## 2023-03-19 NOTE — Progress Notes (Signed)
Patients blood pressure 97/46, pulse 80, patient reported no complaints. At 1440 patient called for this writer an reported she felt dizzy when going to the bathroom and sitting checked blood pressure was 88/60 map 68, pulse 88. MD Gwenlyn Perking made aware. New order to place TED hose and recheck BP in 2 hours, applied TED hose to bilateral lower legs.

## 2023-03-19 NOTE — Evaluation (Signed)
Physical Therapy Evaluation Patient Details Name: Meghan Terry MRN: [id number] DOB: [birth year] Today's Date: 03/19/2023  History of Present Illness  Meghan Terry is a 62 y.o. female with medical history significant of chronic HFpEF, cor pulmonale with mild reduced RV function, CAD status post CABG in 11/2018 and PCI, CKD stage IV, IIDM, HTN, HLD, restless leg syndrome, presented with worsening of peripheral edema and shortness of breath.     Symptoms started 2 to 3 weeks ago, patient reported gradually and slowly she started to accumulate fluid again, she was recently hospitalized 2 times in last 59-month for similar presentation of preop and exertional dyspnea, denies any chest pain no fever chills, she went to see cardiology who initially increased her torsemide to 80 mg twice daily, last week her nephrology further increased her torsemide to 100 mg twice daily.  Despite, she continues to gain weight estimated weight gaining of more than 10 pounds in 1 month.   Clinical Impression  Patient evaluated by Physical Therapy with no further acute PT needs identified. All education has been completed and the patient has no further questions. Patient is independent with all mobility without assistive devices. Patient did not exhibit losses of balance with gait challenge activities. Patient does have a significant history for falls with approximately 10 in the last six months, but these seem to have medical causation which is now resolved or being treated. See below for any follow-up Physical Therapy or equipment needs. PT is signing off. Thank you for this referral.        Recommendations for follow up therapy are one component of a multi-disciplinary discharge planning process, led by the attending physician.  Recommendations may be updated based on patient status, additional functional criteria and insurance authorization.  Follow Up Recommendations       Assistance Recommended at Discharge None  Patient  can return home with the following       Equipment Recommendations None recommended by PT  Recommendations for Other Services       Functional Status Assessment Patient has not had a recent decline in their functional status     Precautions / Restrictions Precautions Precautions: Fall Precaution Comments: patient report about 10 fall in the last six months Restrictions Weight Bearing Restrictions: No      Mobility  Bed Mobility Overal bed mobility: Independent                  Transfers Overall transfer level: Independent      Ambulation/Gait   Gait Distance (Feet): 20 Feet Assistive device: None Gait Pattern/deviations: Step-through pattern Gait velocity: decreased   Pre-gait activities: marching, picking up object from floor and turning in a circle left and right independent and steady; no loss of balance General Gait Details: good gait patterning and steadiness without assistive device  Stairs            Wheelchair Mobility    Modified Rankin (Stroke Patients Only)       Balance Overall balance assessment: No apparent balance deficits (not formally assessed)           Pertinent Vitals/Pain Pain Assessment Pain Assessment: 0-10 Pain Score: 5  Pain Location: right lower quadrant posterior Pain Descriptors / Indicators: Cramping, Spasm Pain Intervention(s): Limited activity within patient's tolerance, Monitored during session    Home Living Family/patient expects to be discharged to:: Private residence Living Arrangements: Children Available Help at Discharge: Family Type of Home: Other(Comment) (camper) Home Access: Stairs to enter Entrance Stairs-Rails:  Left Entrance Stairs-Number of Steps: 3   Home Layout: One level Home Equipment: None      Prior Function Prior Level of Function : Independent/Modified Independent;Driving             Mobility Comments: independent with all mobility with no assitive devices ADLs  Comments: independent with all ADLs     Hand Dominance        Extremity/Trunk Assessment   Upper Extremity Assessment Upper Extremity Assessment: Overall WFL for tasks assessed    Lower Extremity Assessment Lower Extremity Assessment: Overall WFL for tasks assessed       Communication   Communication: No difficulties  Cognition Arousal/Alertness: Awake/alert Behavior During Therapy: WFL for tasks assessed/performed Overall Cognitive Status: Within Functional Limits for tasks assessed        General Comments      Exercises     Assessment/Plan    PT Assessment Patient does not need any further PT services  PT Problem List         PT Treatment Interventions      PT Goals (Current goals can be found in the Care Plan section)       Frequency          AM-PAC PT "6 Clicks" Mobility  Outcome Measure Help needed turning from your back to your side while in a flat bed without using bedrails?: None Help needed moving from lying on your back to sitting on the side of a flat bed without using bedrails?: None Help needed moving to and from a bed to a chair (including a wheelchair)?: None Help needed standing up from a chair using your arms (e.g., wheelchair or bedside chair)?: None Help needed to walk in hospital room?: None Help needed climbing 3-5 steps with a railing? : None 6 Click Score: 24    End of Session   Activity Tolerance: Patient tolerated treatment well Patient left: in bed;with call bell/phone within reach Nurse Communication: Mobility status PT Visit Diagnosis: Unsteadiness on feet (R26.81);History of falling (Z91.81)    Time: 6295-2841 PT Time Calculation (min) (ACUTE ONLY): 12 min   Charges:   PT Evaluation $PT Eval Low Complexity: 1 Low          Dametria Tuzzolino D. Hartnett-Rands, MS, PT [facility] [place] [phone]  Britta Mccreedy  Hartnett-Rands 03/19/2023, 10:33 AM

## 2023-03-19 NOTE — Progress Notes (Addendum)
Rounding Note    Patient Name: Meghan Terry Date of Encounter: 03/19/2023  [facility] Cardiologist: Marjo Bicker, MD   Subjective   Breathing improved. Lower extremity edema resolved. No chest pain or palpitations. She has been switched to PO diuretics by Nephrology.   Inpatient Medications    Scheduled Meds:  aspirin EC  81 mg Oral Daily   buPROPion  300 mg Oral Daily   clopidogrel  75 mg Oral Daily   heparin  5,000 Units Subcutaneous Q12H   insulin aspart  0-9 Units Subcutaneous TID WC   [START ON 03/20/2023] levothyroxine  50 mcg Oral Once per day on Sun Sat   levothyroxine  75 mcg Oral Once per day on Mon Tue Wed Thu Fri   linaclotide  145 mcg Oral QAC breakfast   metoprolol succinate  12.5 mg Oral QHS   pantoprazole  40 mg Oral BID   potassium chloride  40 mEq Oral BID   ranolazine  1,000 mg Oral BID   rOPINIRole  4 mg Oral QHS   rosuvastatin  40 mg Oral QHS   sodium chloride flush  3 mL Intravenous Q12H   topiramate  100 mg Oral BID   torsemide  100 mg Oral BID   traZODone  150 mg Oral QHS   Continuous Infusions:  sodium chloride     ferric gluconate (FERRLECIT) IVPB     PRN Meds: sodium chloride, acetaminophen, gabapentin, ondansetron (ZOFRAN) IV, ondansetron, sodium chloride flush, tiZANidine, traMADol   Vital Signs    Vitals:   03/18/23 1553 03/18/23 2111 03/19/23 0005 03/19/23 0400  BP: (!) 95/55 93/60 (!) 97/57 (!) 102/57  Pulse:  81 68 77  Resp:  19    Temp:  98.4 F (36.9 C) 98.4 F (36.9 C) 97.7 F (36.5 C)  TempSrc:  Oral Oral Oral  SpO2:  97% 100% 100%  Weight:    64.2 kg  Height:        Intake/Output Summary (Last 24 hours) at 03/19/2023 0947 Last data filed at 03/19/2023 0700 Gross per 24 hour  Intake 720 ml  Output 2500 ml  Net -1780 ml      03/19/2023    4:00 AM 03/18/2023    4:38 AM 03/17/2023    8:21 PM  Last 3 Weights  Weight (lbs) 141 lb 8.6 oz 143 lb 4.8 oz 145 lb 11.6 oz  Weight (kg) 64.2 kg 65 kg 66.1  kg      Telemetry    NSR, HR in 60's to 70's.  - Personally Reviewed  ECG    No new tracings.   Physical Exam   GEN: Pleasant female appearing in no acute distress.   Neck: No JVD Cardiac: RRR, no murmurs, rubs, or gallops.  Respiratory: Clear to auscultation bilaterally. GI: Soft, nontender, non-distended  MS: No pitting edema; No deformity. Neuro:  Nonfocal  Psych: Normal affect   Labs    Chemistry Recent Labs  Lab 03/17/23 1703 03/18/23 0420 03/19/23 0408  NA 133* 137 133*  K 3.5 3.1* 3.0*  CL 90* 91* 91*  CO2 31 32 31  GLUCOSE 100* 86 123*  BUN 46* 48* 53*  CREATININE 2.82* 2.88* 3.08*  CALCIUM 8.7* 8.9 8.4*  GFRNONAA 18* 18* 16*  ANIONGAP 12 14 11     Hematology Recent Labs  Lab 03/17/23 1703  WBC 8.4  RBC 3.48*  HGB 10.2*  HCT 32.7*  MCV 94.0  MCH 29.3  MCHC 31.2  RDW  13.5  PLT 243   Thyroid  Recent Labs  Lab 03/17/23 1703  TSH 6.955*    BNP Recent Labs  Lab 03/12/23 1520 03/17/23 1703  BNP 118.0* 105.0*     Radiology    US RENAL  Addendum Date: 03/18/2023   ADDENDUM REPORT: 03/18/2023 12:45 Electronically Signed   By: Lupita Raider M.D.   On: 03/18/2023 12:45   Result Date: 03/18/2023 CLINICAL DATA:  Acute on chronic kidney disease. EXAM: RENAL / URINARY TRACT ULTRASOUND COMPLETE COMPARISON:  January 21, 2023. FINDINGS: Right Kidney: Renal measurements: 8.5 x 3.8 x 3.8 cm = volume: 66 mL. Echogenicity within normal limits. No mass or hydronephrosis visualized. Left Kidney: Renal measurements: 9.5 x 4.7 x 4.4 cm = volume: 104 mL. Echogenicity within normal limits. No mass or hydronephrosis visualized. Bladder: Appears normal for degree of bladder distention. Bilateral ureteral jets are noted. Other: None. IMPRESSION: No significant renal abnormality seen. Electronically Signed: By: Lupita Raider M.D. On: 03/18/2023 12:43   DG Chest 2 View  Result Date: 03/17/2023 CLINICAL DATA:  Weakness.  Chest pain EXAM: CHEST - 2 VIEW  COMPARISON:  X-ray 02/18/2023 FINDINGS: Status post median sternotomy. Normal cardiopericardial silhouette. No consolidation, pneumothorax or effusion. Mild linear changes at the lung bases. Atelectasis or scar is favored. Eventration of the right hemidiaphragm. Degenerative changes along the spine. Surgical changes in the upper abdomen. IMPRESSION: Postop chest. Chronic lung changes. Mild basilar atelectasis or scar. Electronically Signed   By: Karen Kays M.D.   On: 03/17/2023 17:24    Cardiac Studies   Echocardiogram: 11/2022 IMPRESSIONS    1. Left ventricular ejection fraction, by estimation, is 60 to 65%. The  left ventricle has normal function. The left ventricle has no regional  wall motion abnormalities. Left ventricular diastolic parameters are  consistent with Grade II diastolic  dysfunction (pseudonormalization).   2. Right ventricular systolic function is normal. The right ventricular  size is normal. There is normal pulmonary artery systolic pressure. The  estimated right ventricular systolic pressure is 19.0 mmHg.   3. Left atrial size was mildly dilated.   4. The mitral valve is grossly normal. Mild mitral valve regurgitation.   5. The aortic valve is tricuspid. There is mild calcification of the  aortic valve. Aortic valve regurgitation is not visualized. Aortic valve  sclerosis is present, with no evidence of aortic valve stenosis.   6. The inferior vena cava is normal in size with greater than 50%  respiratory variability, suggesting right atrial pressure of 3 mmHg.   Comparison(s): Prior images reviewed side by side. LVEF remains normal  range at 60-65%. RV contraction is normal.    Patient Profile     62 y.o. female with a hx of CAD (s/p CABG in 2020 with LIMA-LAD, SVG-OM and SVG-PDA, prior subtotal occlusion of PDA which was felt to be due to spasm, DESx3 to RCA in 10/2019, cath in 12/2020 showing patent grafts and medical management recommended), HTN, HLD, Type II  DM, Stage 4 CKD, anemia (EGD showing esophagitis and gastritis) and HFpEF who is currently admitted for an acute CHF exacerbation.   Assessment & Plan    1. Acute HFpEF - Presented with worsening edema and dyspnea on exertion, found to have an acute CHF exacerbation with BNP at 105. She has been receiving IV Lasix 80mg  BID and Metolazone 2.5mg  daily with a recorded net output of -2.3 L this admission (-1.7 L yesterday). Evaluated by Nephrology this AM with Metolazone being  discontinued and switched from IV Lasix to Torsemide 100mg  BID given worsening renal function. Would check ambulatory oxygen saturations prior to discharge. Discussed with Nephrology and recommended to hold off on restarting Farxiga at this time. Would readdress as an outpatient. She does have upcoming follow-up with Advanced Heart Failure on 03/30/2023.   2. CAD - She is s/p CABG in 2020 and DESx3 to RCA in 2021. Most recent cath in 2022 showed patent grafts.  - She denies any anginal symptoms. Continue medical therapy with ASA 81mg  daily, Plavix 75mg  daily, Toprol-XL 12.5mg  daily, Ranexa 1000mg  BID and Crestor 40mg  daily.   3. HTN - BP has been soft over the past 24 hours, at 88/52 - 110/53. Improved to 112/64 on most recent check. Remains on Toprol-XL 12.5mg  daily.   4. HLD - Continue Crestor 40mg  daily.   5. Stage 4 CKD/Hypokalemia - Creatinine has been elevated at 2.2 - 3.3 within the past month. Was at 2.88 yesterday and trending up to 3.08 today. Already switched to PO Torsemide by Nephrology. K+ at 3.0 today and supplementation already ordered.   For questions or updates, please contact [facility] Please consult [email or web address] for contact info under        Signed, Ellsworth Lennox, PA-C  03/19/2023, 9:47 AM     Continue note:  Interval hospital course reviewed and situation discussed with Ms. Iran Ouch PA-C.  Also reviewed with nephrology.  Fluid status improved with IV diuresis, agree with transition  to oral diuretics in the form of Demadex.  She is hemodynamically stable, net urine output of approximately 1800 cc last 24 hours.  Pertinent lab work includes BUN 53 and creatinine 3.08, also potassium 3.0.  Would supplement potassium, continue aspirin, Plavix, Toprol-XL, Ranexa, and Crestor.  Transitioning to Demadex 100 mg twice daily for now, follow-up urine output and BMET in a.m. Hold Marcelline Deist for now, but expect this will be able to be reintroduced as an outpatient with concurrent reduction in Demadex dosing depending on fluid status.  Potentially discharge home tomorrow with follow-up in the advanced heart failure clinic.  Jonelle Sidle, M.D., F.A.C.C.

## 2023-03-19 NOTE — Care Management Important Message (Signed)
Important Message  Patient Details  Name: Meghan Terry MRN: [id number] Date of Birth: May 02, 1961   Medicare Important Message Given:  Yes     Corey Harold 03/19/2023, 3:57 PM

## 2023-03-19 NOTE — Progress Notes (Signed)
Progress Note   Patient: Meghan Terry ZOX:[id number] DOB: August 23, 1961 DOA: 03/17/2023     2 DOS: the patient was seen and examined on 03/19/2023   Brief hospital admission narrative: As per H&P written by Dr. Chipper Herb on 03/17/2023 DARNELLE DERRICK is a 62 y.o. female with medical history significant of chronic HFpEF, cor pulmonale with mild reduced RV function, CAD status post CABG in 11/2018 and PCI, CKD stage IV, IIDM, HTN, HLD, restless leg syndrome, presented with worsening of peripheral edema and shortness of breath.   Symptoms started 2 to 3 weeks ago, patient reported gradually and slowly she started to accumulate fluid again, she was recently hospitalized 2 times in last 35-month for similar presentation of preop and exertional dyspnea, denies any chest pain no fever chills, she went to see cardiology who initially increased her torsemide to 80 mg twice daily, last week her nephrology further increased her torsemide to 100 mg twice daily.  Despite, she continues to gain weight estimated weight gaining of more than 10 pounds in 1 month.   ED Course: Afebrile, nontachycardic nonhypotensive nonhypoxic, SBP 110s.  Checks x-ray showed mild pulmonary congestion, blood work showed creatinine 2.8 compared to baseline 2.6, bicarb 31 K3.5.  Assessment and Plan: Acute on chronic diastolic heart failure -Recurrent and difficult with advanced chronic kidney disease. -Has responded well to aggressive diuresis and the use of metolazone -Creatinine has trended into the low threes range with a GFR of 15-16 currently. - no acute indication for dialysis -Continue daily weights, low-sodium diet and strict I's and O's -Following recommendations per nephrology service Lasix and metolazone will be discontinued at the moment and the patient started back on torsemide 100 mg twice a day. -Follow urine output and for the clinical response. -There is no acute indication for dialysis currently.  Chronic kidney disease stage  IV -Patient is followed by Dr. Wolfgang Phoenix as an outpatient -Continue to follow nephrology service while inpatient -Diuretic therapy transition to oral route today -Continue minimizing nephrotoxic agents and maintain adequate hydration.  Coronary artery disease status post CABG -Continue aspirin Plavix, metoprolol, ranolazine and statin -No acute ischemic changes appreciated on EKG or telemetry -No chest pain.  Anemia of chronic kidney disease -There is no signs of overt bleeding -For transfusion below 7.5 given history of coronary disease -Follow nephrology service recommendation for IV iron and Epogen therapy.  No insulin-dependent type 2 diabetes -Continue holding [place] given poor creatinine clearance -Most recent A1c 4.9 -Continue to follow CBGs fluctuation and determine need for sliding scale insulin -Modified carbohydrate diet and adequate hydration discussed with patient.  Hypokalemia -In the setting of diuresis -Continue to replete electrolytes and provide daily supplementation. -Follow trend.  Subjective:  No chest pain, no nausea, no vomiting.  Reports improvement in her breathing and overall swelling.  Good urine output reported.  Physical Exam: Vitals:   03/18/23 1553 03/18/23 2111 03/19/23 0005 03/19/23 0400  BP: (!) 95/55 93/60 (!) 97/57 (!) 102/57  Pulse:  81 68 77  Resp:  19    Temp:  98.4 F (36.9 C) 98.4 F (36.9 C) 97.7 F (36.5 C)  TempSrc:  Oral Oral Oral  SpO2:  97% 100% 100%  Weight:    64.2 kg  Height:       General exam: Alert, awake, oriented x 3; not requiring oxygen supplementation and expressing improvement in her breathing.  Patient denies chest pain. Respiratory system: No wheezing, no crackles, decreased breath sounds at the bases without use of  accessory muscles appreciated.  Good saturation on room air. Cardiovascular system: No rubs, no gallops, no JVD. Gastrointestinal system: Abdomen is nondistended, soft and nontender. No  organomegaly or masses felt. Normal bowel sounds heard. Central nervous system: Alert and oriented. No focal neurological deficits. Extremities: No cyanosis or clubbing; trace edema appreciated bilaterally. Skin: No petechiae. Psychiatry: Judgement and insight appear normal. Mood & affect appropriate.   Data Reviewed: Iron studies with saturation ration of 9 -Ferritin: 14 Basic metabolic panel: Sodium 133, potassium 3.0, chloride 91, bicarb 31, BUN 53, creatinine 3.08 and GFR 16  Family Communication: No family at bedside.  Disposition: Status is: Inpatient Remains inpatient appropriate because: Continue diuresis, volume management stabilization of patient's renal function.   Planned Discharge Destination: Home  Time spent: 35 minutes  Author: Vassie Loll, MD 03/19/2023 9:43 AM  For on call review [email or web address].

## 2023-03-20 DIAGNOSIS — R601 Generalized edema: Secondary | ICD-10-CM | POA: Diagnosis not present

## 2023-03-20 DIAGNOSIS — N179 Acute kidney failure, unspecified: Secondary | ICD-10-CM | POA: Diagnosis not present

## 2023-03-20 DIAGNOSIS — D638 Anemia in other chronic diseases classified elsewhere: Secondary | ICD-10-CM | POA: Diagnosis not present

## 2023-03-20 DIAGNOSIS — I5033 Acute on chronic diastolic (congestive) heart failure: Secondary | ICD-10-CM | POA: Diagnosis not present

## 2023-03-20 LAB — BASIC METABOLIC PANEL
Anion gap: 11 (ref 5–15)
BUN: 56 mg/dL — ABNORMAL HIGH (ref 8–23)
CO2: 30 mmol/L (ref 22–32)
Calcium: 8.8 mg/dL — ABNORMAL LOW (ref 8.9–10.3)
Chloride: 94 mmol/L — ABNORMAL LOW (ref 98–111)
Creatinine, Ser: 3.36 mg/dL — ABNORMAL HIGH (ref 0.44–1.00)
GFR, Estimated: 15 mL/min — ABNORMAL LOW (ref >60)
Glucose, Bld: 113 mg/dL — ABNORMAL HIGH (ref 70–99)
Potassium: 3.7 mmol/L (ref 3.5–5.1)
Sodium: 135 mmol/L (ref 135–145)

## 2023-03-20 LAB — GLUCOSE, CAPILLARY
Glucose-Capillary: 110 mg/dL — ABNORMAL HIGH (ref 70–99)
Glucose-Capillary: 166 mg/dL — ABNORMAL HIGH (ref 70–99)

## 2023-03-20 MED ORDER — GABAPENTIN 100 MG PO CAPS: 300.0000 mg | ORAL_CAPSULE | Freq: Every day | ORAL | Status: DC

## 2023-03-20 MED ORDER — METOLAZONE 5 MG PO TABS: ORAL_TABLET | ORAL | 1 refills | Status: DC

## 2023-03-20 MED ORDER — TORSEMIDE 60 MG PO TABS: 60.0000 mg | ORAL_TABLET | Freq: Two times a day (BID) | ORAL | 2 refills | Status: DC

## 2023-03-20 MED ORDER — DAPAGLIFLOZIN PROPANEDIOL 10 MG PO TABS: 10.0000 mg | ORAL_TABLET | Freq: Every day | ORAL | Status: DC

## 2023-03-20 MED ORDER — TORSEMIDE 20 MG PO TABS: 60.0000 mg | ORAL_TABLET | Freq: Two times a day (BID) | ORAL | Status: DC

## 2023-03-20 NOTE — Progress Notes (Signed)
Patients blood pressure 85/48 map 60, pulse 82. Patient reported no signs of low blood pressure. MD Gwenlyn Perking made aware. New order placed hold Demadex.

## 2023-03-20 NOTE — Discharge Summary (Signed)
Physician Discharge Summary   Patient: Meghan Terry MRN: [id number] DOB: Jan 09, 1961  Admit date:     03/17/2023  Discharge date: 03/20/23  Discharge Physician: Vassie Loll   PCP: Benita Stabile, MD   Recommendations at discharge:  Repeat basic metabolic panel to follow ultralights and renal function Make sure patient has follow-up with nephrology and cardiology as instructed Repeat CBC to follow hemoglobin trend Reassess blood pressure/volume status and further adjust diuretic therapy. Continue close monitoring of patient's CBGs/A1c with further management of her hypoglycemic therapy as needed Discharge Diagnoses: Acute on chronic diastolic heart failure Acute kidney injury on chronic stage IV kidney disease Hypokalemia History of coronary artery disease status post CABG Anemia chronic disease Known insulin-dependent type 2 diabetes   Brief hospital admission narrative: As per H&P written by Dr. Chipper Herb on 03/17/2023 Meghan Terry is a 62 y.o. female with medical history significant of chronic HFpEF, cor pulmonale with mild reduced RV function, CAD status post CABG in 11/2018 and PCI, CKD stage IV, IIDM, HTN, HLD, restless leg syndrome, presented with worsening of peripheral edema and shortness of breath.   Symptoms started 2 to 3 weeks ago, patient reported gradually and slowly she started to accumulate fluid again, she was recently hospitalized 2 times in last 52-month for similar presentation of preop and exertional dyspnea, denies any chest pain no fever chills, she went to see cardiology who initially increased her torsemide to 80 mg twice daily, last week her nephrology further increased her torsemide to 100 mg twice daily.  Despite, she continues to gain weight estimated weight gaining of more than 10 pounds in 1 month.   ED Course: Afebrile, nontachycardic nonhypotensive nonhypoxic, SBP 110s.  Checks x-ray showed mild pulmonary congestion, blood work showed creatinine 2.8 compared to  baseline 2.6, bicarb 31 K3.5.  Assessment and Plan: Acute on chronic diastolic heart failure -Recurrent and difficult with advanced chronic kidney disease. -Has responded well to aggressive diuresis and the use of metolazone -Creatinine has trended into the low threes range with a GFR of 15-16 currently. -Continue daily weights and low-sodium diet. -Following recommendations per nephrology service torsemide and metolazone dosages has been adjusted. -Reds clip measurement at discharge 24.   -There is no acute indication for dialysis currently.   Chronic kidney disease stage IV -Patient is followed by Dr. Wolfgang Phoenix as an outpatient -Continue to follow nephrology service while inpatient -Diuretic therapy transition to oral route and dose has been adjusted to 60 mg Demadex twice a day with as needed weekly metolazone. -Continue minimizing nephrotoxic agents and maintain adequate hydration.   Coronary artery disease status post CABG -Continue aspirin Plavix, metoprolol, ranolazine and statin -No acute ischemic changes appreciated on EKG or telemetry -No chest pain. -Continue outpatient follow-up with cardiology service.   Anemia of chronic kidney disease -There is no signs of overt bleeding -For transfusion below 7.5 given history of coronary disease -Continue to follow nephrology service recommendation for IV iron and Epogen therapy.   No insulin-dependent type 2 diabetes -Continue holding [place] given poor creatinine clearance -Most recent A1c 4.9 -Continue to follow CBGs fluctuation  -Modified carbohydrate diet and adequate hydration discussed with patient.   Hypokalemia -In the setting of diuresis -Continue to replete electrolytes and provide daily supplementation. -Follow trend.  Consultants: Cardiology and nephrology service Procedures performed: See below for x-ray reports Disposition: Home Diet recommendation: Heart healthy/low-sodium and modified carbohydrate  diet.  DISCHARGE MEDICATION: Allergies as of 03/20/2023       Reactions  Abilify [aripiprazole]    Ciprofloxacin Nausea And Vomiting   Carbidopa-levodopa Other (See Comments)   Developed tics while taking   Prednisone Other (See Comments)   Turns red all over   Venlafaxine Other (See Comments)   Developed tics while taking - reaction to Effexor        Medication List     TAKE these medications    Acetaminophen Extra Strength 500 MG Tabs Take 2 tablets by mouth 3 times daily as needed for pain   aspirin EC 81 MG tablet Take 1 tablet (81 mg total) by mouth daily.   buPROPion 300 MG 24 hr tablet Commonly known as: WELLBUTRIN XL Take 1 tablet (300 mg total) by mouth daily.   cholecalciferol 25 MCG (1000 UNIT) tablet Commonly known as: VITAMIN D3 Take 1 tablet (1,000 Units total) by mouth in the morning and at bedtime.   clopidogrel 75 MG tablet Commonly known as: PLAVIX TAKE 1 TABLET BY MOUTH DAILY   cyanocobalamin 1000 MCG tablet Commonly known as: VITAMIN B12 Take 1 tablet (1,000 mcg total) by mouth daily.   dapagliflozin propanediol 10 MG Tabs tablet Commonly known as: Farxiga Take 1 tablet (10 mg total) by mouth daily before breakfast. Hold medication until follow-up with cardiology service. What changed: additional instructions   ferrous sulfate 325 (65 FE) MG EC tablet Take 325 mg by mouth 2 (two) times daily.   folic acid 1 MG tablet Commonly known as: FOLVITE Take 1 tablet (1 mg total) by mouth daily.   gabapentin 100 MG capsule Commonly known as: Neurontin Take 3 capsules (300 mg total) by mouth at bedtime.   levothyroxine 75 MCG tablet Commonly known as: SYNTHROID Take 1 tablet (75 mcg total) by mouth daily Monday thru Friday.   levothyroxine 50 MCG tablet Commonly known as: SYNTHROID Take 1 tablet (50 mcg total) by mouth See admin instructions. Take 1 tablet in the morning with breakfast on SATURDAY AND SUNDAY.   linaclotide 145 MCG Caps  capsule Commonly known as: Linzess Take 1 capsule (145 mcg total) by mouth daily before breakfast.   metolazone 5 MG tablet Commonly known as: ZAROXOLYN Once a week on Wednesdays if you gain weight or swelling appreciated. Start taking on: March 22, 2023 What changed:  how much to take how to take this when to take this additional instructions These instructions start on March 22, 2023. If you are unsure what to do until then, ask your doctor or other care provider.   metoprolol succinate 25 MG 24 hr tablet Commonly known as: TOPROL-XL Take 0.5 tablets (12.5 mg total) by mouth at bedtime. Take with or immediately following a meal.   nitroGLYCERIN 0.4 MG SL tablet Commonly known as: NITROSTAT Place 1 tablet (0.4 mg total) under the tongue every 5 (five) minutes as needed for chest pain.   ondansetron 4 MG tablet Commonly known as: ZOFRAN take 1 tablet (4 mg) by mouth 2 times per day as needed for nausea   pantoprazole 40 MG tablet Commonly known as: PROTONIX Take 1 tablet (40 mg total) by mouth 2 (two) times daily.   Potassium Chloride ER 20 MEQ Tbcr Take 20 mEq by mouth 2 (two) times daily.   promethazine 25 MG tablet Commonly known as: PHENERGAN Take 1 tablet (25 mg total) by mouth every 6 (six) hours as needed for nausea or vomiting.   ranolazine 1000 MG SR tablet Commonly known as: Ranexa Take 1 tablet (1,000 mg total) by mouth 2 (two) times daily.  REFRESH OP Apply 1 drop to eye 4 (four) times daily.   rOPINIRole 4 MG tablet Commonly known as: REQUIP Take 1 tablet (4 mg total) by mouth 1 to 3 hours before bedtime.   rosuvastatin 40 MG tablet Commonly known as: CRESTOR Take 1 tablet (40 mg total) by mouth at bedtime.   topiramate 100 MG tablet Commonly known as: TOPAMAX TAKE 1 TABLET BY MOUTH TWICE DAILY.   Torsemide 60 MG Tabs Take 60 mg by mouth 2 (two) times daily. What changed:  medication strength how much to take   traMADol 50 MG tablet Commonly  known as: ULTRAM Take 1 tablet (50 mg total) by mouth daily as needed for moderate pain.   traZODone 100 MG tablet Commonly known as: DESYREL Take 150 mg by mouth at bedtime.        Follow-up Information     Benita Stabile, MD. Schedule an appointment as soon as possible for a visit in 10 day(s).   Specialty: Internal Medicine Contact information: [address] [place] [address] [phone]                Discharge Exam: Filed Weights   03/18/23 0438 03/19/23 0400 03/20/23 0300  Weight: 65 kg 64.2 kg 63.9 kg   General exam: Alert, awake, oriented x 3; not requiring oxygen supplementation and expressing improvement in her breathing.  Patient denies chest pain. Respiratory system: No wheezing, no crackles, decreased breath sounds at the bases without use of accessory muscles appreciated.  Good saturation on room air. Cardiovascular system: No rubs, no gallops, no JVD. Gastrointestinal system: Abdomen is nondistended, soft and nontender. No organomegaly or masses felt. Normal bowel sounds heard. Central nervous system: Alert and oriented. No focal neurological deficits. Extremities: No cyanosis or clubbing; trace edema appreciated bilaterally. Skin: No petechiae. Psychiatry: Judgement and insight appear normal. Mood & affect appropriate.   Condition at discharge: Stable and improved.  The results of significant diagnostics from this hospitalization (including imaging, microbiology, ancillary and laboratory) are listed below for reference.   Imaging Studies: US RENAL  Addendum Date: 03/18/2023   ADDENDUM REPORT: 03/18/2023 12:45 Electronically Signed   By: Lupita Raider M.D.   On: 03/18/2023 12:45   Result Date: 03/18/2023 CLINICAL DATA:  Acute on chronic kidney disease. EXAM: RENAL / URINARY TRACT ULTRASOUND COMPLETE COMPARISON:  January 21, 2023. FINDINGS: Right Kidney: Renal measurements: 8.5 x 3.8 x 3.8 cm = volume: 66 mL. Echogenicity within normal limits.  No mass or hydronephrosis visualized. Left Kidney: Renal measurements: 9.5 x 4.7 x 4.4 cm = volume: 104 mL. Echogenicity within normal limits. No mass or hydronephrosis visualized. Bladder: Appears normal for degree of bladder distention. Bilateral ureteral jets are noted. Other: None. IMPRESSION: No significant renal abnormality seen. Electronically Signed: By: Lupita Raider M.D. On: 03/18/2023 12:43   DG Chest 2 View  Result Date: 03/17/2023 CLINICAL DATA:  Weakness.  Chest pain EXAM: CHEST - 2 VIEW COMPARISON:  X-ray 02/18/2023 FINDINGS: Status post median sternotomy. Normal cardiopericardial silhouette. No consolidation, pneumothorax or effusion. Mild linear changes at the lung bases. Atelectasis or scar is favored. Eventration of the right hemidiaphragm. Degenerative changes along the spine. Surgical changes in the upper abdomen. IMPRESSION: Postop chest. Chronic lung changes. Mild basilar atelectasis or scar. Electronically Signed   By: Karen Kays M.D.   On: 03/17/2023 17:24   US RENAL  Result Date: 03/12/2023 CLINICAL DATA:  Chronic renal disease EXAM: RENAL / URINARY TRACT ULTRASOUND  COMPLETE COMPARISON:  None Available. FINDINGS: Right Kidney: Renal measurements: 9.4 x 3.8 x 4.1 cm = volume: 77 mL. Increased echogenicity. Left Kidney: Renal measurements: 9.6 x 4.4 x 3.7 cm = volume: 82 mL. Increased cortical echogenicity. Bladder: Appears normal for degree of bladder distention. Other: None. IMPRESSION: Increased cortical echogenicity in both kidneys consistent with medical renal disease. No other abnormalities. Electronically Signed   By: Gerome Sam III M.D.   On: 03/12/2023 13:27   EEG adult  Result Date: 02/25/2023 Windell Norfolk, MD     03/04/2023  6:10 PM History: 62 year old woman with confusion and cognitive impairment EEG classification: Awake and drowsy Description of the recording: The background rhythms of this recording consists of a fairly well modulated medium amplitude  delta-theta activity consistent with mild to moderate diffuse slowing.   Photic stimulation was performed, did not show any abnormalities. Hyperventilation was also performed, did not show any abnormalities. No abnormal epileptiform discharges seen during this recording. There was mild to moderate diffuse slowing. There were no electrographic seizure identified. Abnormality: Mild to moderate diffuse slowing Impression: This is an abnormal EEG recorded while drowsy and awake due to mild to moderate diffuse slowing. This is consistent with a generalized brain dysfunction such as encephalopathy, nonspecific. Windell Norfolk, MD [facility]   CT HEAD WO CONTRAST ( )  Result Date: 02/18/2023 CLINICAL DATA:  Syncope, fell EXAM: CT HEAD WITHOUT CONTRAST TECHNIQUE: Contiguous axial images were obtained from the base of the skull through the vertex without intravenous contrast. RADIATION DOSE REDUCTION: This exam was performed according to the departmental dose-optimization program which includes automated exposure control, adjustment of the mA and/or kV according to patient size and/or use of iterative reconstruction technique. COMPARISON:  12/21/2022 FINDINGS: Brain: No acute infarct or hemorrhage. Lateral ventricles and midline structures are unremarkable. No acute extra-axial fluid collections. No mass effect. Vascular: No hyperdense vessel or unexpected calcification. Skull: Normal. Negative for fracture or focal lesion. Sinuses/Orbits: No acute finding. Other: None. IMPRESSION: 1. No acute infarct or hemorrhage. Electronically Signed   By: Sharlet Salina M.D.   On: 02/18/2023 21:51   DG Chest 2 View  Result Date: 02/18/2023 CLINICAL DATA:  Shortness of breath, bilateral leg swelling. EXAM: CHEST - 2 VIEW COMPARISON:  01/25/2023. FINDINGS: The heart size and mediastinal contours are within normal limits. There is atherosclerotic calcification of the aorta. Interstitial prominence is noted  bilaterally and mild atelectasis or scarring is noted at the costophrenic angles bilaterally, unchanged from prior exams. No effusion or pneumothorax. Sternotomy wires are noted. Surgical clips are present in the right upper quadrant. IMPRESSION: No active cardiopulmonary disease. Electronically Signed   By: Thornell Sartorius M.D.   On: 02/18/2023 20:08    Microbiology: Results for orders placed or performed during the hospital encounter of 01/25/23  Resp panel by RT-PCR (RSV, Flu A&B, Covid) Anterior Nasal Swab     Status: None   Collection Time: 01/25/23  2:56 PM   Specimen: Anterior Nasal Swab  Result Value Ref Range Status   SARS Coronavirus 2 by RT PCR NEGATIVE NEGATIVE Final    Comment: (NOTE) SARS-CoV-2 target nucleic acids are NOT DETECTED.  The SARS-CoV-2 RNA is generally detectable in upper respiratory specimens during the acute phase of infection. The lowest concentration of SARS-CoV-2 viral copies this assay can detect is 138 copies/mL. A negative result does not preclude SARS-Cov-2 infection and should not be used as the sole basis for treatment or other patient management decisions. A  negative result may occur with  improper specimen collection/handling, submission of specimen other than nasopharyngeal swab, presence of viral mutation(s) within the areas targeted by this assay, and inadequate number of viral copies(<138 copies/mL). A negative result must be combined with clinical observations, patient history, and epidemiological information. The expected result is Negative.  Fact Sheet for Patients:  BloggerCourse.com  Fact Sheet for Healthcare Providers:  SeriousBroker.it  This test is no t yet approved or cleared by the [place] FDA and  has been authorized for detection and/or diagnosis of SARS-CoV-2 by FDA under an Emergency Use Authorization (EUA). This EUA will remain  in effect (meaning this test can be used)  for the duration of the COVID-19 declaration under Section 564(b)(1) of the Act, 21 U.S.C.section 360bbb-3(b)(1), unless the authorization is terminated  or revoked sooner.       Influenza A by PCR NEGATIVE NEGATIVE Final   Influenza B by PCR NEGATIVE NEGATIVE Final    Comment: (NOTE) The Xpert Xpress SARS-CoV-2/FLU/RSV plus assay is intended as an aid in the diagnosis of influenza from Nasopharyngeal swab specimens and should not be used as a sole basis for treatment. Nasal washings and aspirates are unacceptable for Xpert Xpress SARS-CoV-2/FLU/RSV testing.  Fact Sheet for Patients: BloggerCourse.com  Fact Sheet for Healthcare Providers: SeriousBroker.it  This test is not yet approved or cleared by the [place] FDA and has been authorized for detection and/or diagnosis of SARS-CoV-2 by FDA under an Emergency Use Authorization (EUA). This EUA will remain in effect (meaning this test can be used) for the duration of the COVID-19 declaration under Section 564(b)(1) of the Act, 21 U.S.C. section 360bbb-3(b)(1), unless the authorization is terminated or revoked.     Resp Syncytial Virus by PCR NEGATIVE NEGATIVE Final    Comment: (NOTE) Fact Sheet for Patients: BloggerCourse.com  Fact Sheet for Healthcare Providers: SeriousBroker.it  This test is not yet approved or cleared by the [place] FDA and has been authorized for detection and/or diagnosis of SARS-CoV-2 by FDA under an Emergency Use Authorization (EUA). This EUA will remain in effect (meaning this test can be used) for the duration of the COVID-19 declaration under Section 564(b)(1) of the Act, 21 U.S.C. section 360bbb-3(b)(1), unless the authorization is terminated or revoked.  Performed at [facility], [address], [place], [place] [address]     Labs: CBC: Recent Labs  Lab 03/17/23 1703  WBC 8.4   HGB 10.2*  HCT 32.7*  MCV 94.0  PLT 243   Basic Metabolic Panel: Recent Labs  Lab 03/17/23 1703 03/18/23 0420 03/19/23 0408 03/20/23 0511  NA 133* 137 133* 135  K 3.5 3.1* 3.0* 3.7  CL 90* 91* 91* 94*  CO2 31 32 31 30  GLUCOSE 100* 86 123* 113*  BUN 46* 48* 53* 56*  CREATININE 2.82* 2.88* 3.08* 3.36*  CALCIUM 8.7* 8.9 8.4* 8.8*  PHOS  --   --  6.1*  --    Liver Function Tests: No results for input(s): "AST", "ALT", "ALKPHOS", "BILITOT", "PROT", "ALBUMIN" in the last 168 hours. CBG: Recent Labs  Lab 03/19/23 1101 03/19/23 1620 03/19/23 2200 03/20/23 0735 03/20/23 1118  GLUCAP 170* 87 135* 110* 166*    Discharge time spent: greater than 30 minutes.  Signed: Vassie Loll, MD [facility] 03/20/2023

## 2023-03-20 NOTE — Progress Notes (Signed)
   03/20/23 0800  ReDS Vest / Clip  Station Marker A  Ruler Value 30  ReDS Value Range < 36  ReDS Actual Value 24

## 2023-03-20 NOTE — Progress Notes (Signed)
Patient discharged home today, transported self home in personal vehicle. Discharge paperwork went over with patient by [facility]. Belongings sent home with patient.

## 2023-03-22 ENCOUNTER — Ambulatory Visit: Payer: Commercial Managed Care - HMO | Admitting: Cardiology

## 2023-03-25 ENCOUNTER — Emergency Department (HOSPITAL_COMMUNITY): Payer: Medicare Other

## 2023-03-25 ENCOUNTER — Emergency Department (HOSPITAL_COMMUNITY)
Admission: EM | Admit: 2023-03-25 | Discharge: 2023-03-25 | Disposition: A | Discharge status: Home / Self Care | Payer: Medicare Other | Attending: Emergency Medicine | Admitting: Emergency Medicine

## 2023-03-25 ENCOUNTER — Other Ambulatory Visit: Payer: Self-pay

## 2023-03-25 ENCOUNTER — Encounter (HOSPITAL_COMMUNITY): Payer: Self-pay

## 2023-03-25 DIAGNOSIS — W0110XA Fall on same level from slipping, tripping and stumbling with subsequent striking against unspecified object, initial encounter: Secondary | ICD-10-CM | POA: Insufficient documentation

## 2023-03-25 DIAGNOSIS — S60229A Contusion of unspecified hand, initial encounter: Secondary | ICD-10-CM

## 2023-03-25 DIAGNOSIS — Z7902 Long term (current) use of antithrombotics/antiplatelets: Secondary | ICD-10-CM | POA: Insufficient documentation

## 2023-03-25 DIAGNOSIS — M79641 Pain in right hand: Secondary | ICD-10-CM | POA: Diagnosis not present

## 2023-03-25 DIAGNOSIS — Z23 Encounter for immunization: Secondary | ICD-10-CM | POA: Diagnosis not present

## 2023-03-25 DIAGNOSIS — Z7982 Long term (current) use of aspirin: Secondary | ICD-10-CM | POA: Insufficient documentation

## 2023-03-25 DIAGNOSIS — M79642 Pain in left hand: Secondary | ICD-10-CM | POA: Diagnosis not present

## 2023-03-25 DIAGNOSIS — S0101XA Laceration without foreign body of scalp, initial encounter: Secondary | ICD-10-CM | POA: Diagnosis present

## 2023-03-25 LAB — CBG MONITORING, ED: Glucose-Capillary: 141 mg/dL — ABNORMAL HIGH (ref 70–99)

## 2023-03-25 MED ORDER — LIDOCAINE-EPINEPHRINE (PF) 2 %-1:200000 IJ SOLN
10.0000 mL | Freq: Once | INTRAMUSCULAR | Status: DC
  Filled 2023-03-25: qty 20

## 2023-03-25 MED ORDER — TETANUS-DIPHTH-ACELL PERTUSSIS 5-2.5-18.5 LF-MCG/0.5 IM SUSY
0.5000 mL | PREFILLED_SYRINGE | Freq: Once | INTRAMUSCULAR | Status: AC
  Administered 2023-03-25: 0.5 mL via INTRAMUSCULAR
  Filled 2023-03-25: qty 0.5

## 2023-03-25 MED ORDER — IBUPROFEN 400 MG PO TABS: 400.0000 mg | ORAL_TABLET | Freq: Once | ORAL | Status: DC

## 2023-03-25 MED ORDER — TRAMADOL HCL 50 MG PO TABS
50.0000 mg | ORAL_TABLET | Freq: Once | ORAL | Status: AC
  Administered 2023-03-25: 50 mg via ORAL
  Filled 2023-03-25: qty 1

## 2023-03-25 MED ORDER — LIDOCAINE-EPINEPHRINE-TETRACAINE (LET) TOPICAL GEL
3.0000 mL | Freq: Once | TOPICAL | Status: AC
  Administered 2023-03-25: 3 mL via TOPICAL
  Filled 2023-03-25: qty 3

## 2023-03-25 NOTE — ED Triage Notes (Signed)
Pt fell and hit her head tonight after getting up while still half asleep. Pt has laceration to forehead. Pt is on plavix. A&O x4 and is not complaining of any pain at this time. C-collar in place.

## 2023-03-25 NOTE — ED Notes (Signed)
Patient transported to CT 

## 2023-03-25 NOTE — ED Notes (Signed)
Patient verbalizes understanding of discharge instructions. Opportunity for questioning and answers were provided. Armband removed by staff, pt discharged from ED. Wheeled to lobby  

## 2023-03-25 NOTE — ED Notes (Signed)
Pt's daughter is on the way for transport home

## 2023-03-25 NOTE — ED Provider Notes (Signed)
[place] EMERGENCY DEPARTMENT AT [facility] Provider Note   CSN: [id number] Arrival date & time: 03/25/23  0120     History  Chief Complaint  Patient presents with   Meghan Terry is a 62 y.o. female.  Patient presents to the emergency department for evaluation of head injury.  Patient reports that she was sitting on the edge of her couch and fell asleep, fell forward.  She suffered a laceration to the top of her head, has a headache.  She reports that she fell on both of her hands and they are painful as well.       Home Medications Prior to Admission medications   Medication Sig Start Date End Date Taking? Authorizing Provider  acetaminophen (TYLENOL) 500 MG tablet Take 2 tablets by mouth 3 times daily as needed for pain 12/08/21     aspirin EC 81 MG tablet Take 1 tablet (81 mg total) by mouth daily. 09/05/19   Revankar, Aundra Dubin, MD  buPROPion (WELLBUTRIN XL) 300 MG 24 hr tablet Take 1 tablet (300 mg total) by mouth daily. 07/06/22     cholecalciferol 25 MCG (1000 UT) tablet Take 1 tablet (1,000 Units total) by mouth in the morning and at bedtime. 12/25/22   Johnson, Clanford L, MD  clopidogrel (PLAVIX) 75 MG tablet TAKE 1 TABLET BY MOUTH DAILY 03/30/22   Revankar, Aundra Dubin, MD  cyanocobalamin (VITAMIN B12) 1000 MCG tablet Take 1 tablet (1,000 mcg total) by mouth daily. 12/25/22   Johnson, Clanford L, MD  dapagliflozin propanediol (FARXIGA) 10 MG TABS tablet Take 1 tablet (10 mg total) by mouth daily before breakfast. Hold medication until follow-up with cardiology service. 03/20/23   Vassie Loll, MD  ferrous sulfate 325 (65 FE) MG EC tablet Take 325 mg by mouth 2 (two) times daily.    [provider]  folic acid (FOLVITE) 1 MG tablet Take 1 tablet (1 mg total) by mouth daily. 12/26/22   Johnson, Clanford L, MD  gabapentin (NEURONTIN) 100 MG capsule Take 3 capsules (300 mg total) by mouth at bedtime. 03/20/23   Vassie Loll, MD  levothyroxine (SYNTHROID)  50 MCG tablet Take 1 tablet (50 mcg total) by mouth See admin instructions. Take 1 tablet in the morning with breakfast on SATURDAY AND SUNDAY. 02/21/23   Cleora Fleet, MD  levothyroxine (SYNTHROID) 75 MCG tablet Take 1 tablet (75 mcg total) by mouth daily Monday thru Friday. 06/08/22     linaclotide (LINZESS) 145 MCG CAPS capsule Take 1 capsule (145 mcg total) by mouth daily before breakfast. 01/21/23   Aida Raider, NP  metolazone (ZAROXOLYN) 5 MG tablet Once a week on Wednesdays if you gain weight or swelling appreciated. 03/22/23   Vassie Loll, MD  metoprolol succinate (TOPROL-XL) 25 MG 24 hr tablet Take 0.5 tablets (12.5 mg total) by mouth at bedtime. Take with or immediately following a meal. 12/25/22   Johnson, Clanford L, MD  nitroGLYCERIN (NITROSTAT) 0.4 MG SL tablet Place 1 tablet (0.4 mg total) under the tongue every 5 (five) minutes as needed for chest pain. 12/29/21   Revankar, Aundra Dubin, MD  ondansetron (ZOFRAN) 4 MG tablet take 1 tablet (4 mg) by mouth 2 times per day as needed for nausea 05/12/22     pantoprazole (PROTONIX) 40 MG tablet Take 1 tablet (40 mg total) by mouth 2 (two) times daily. 12/25/22   Cleora Fleet, MD  Polyvinyl Alcohol-Povidone (REFRESH OP) Apply 1 drop to  eye 4 (four) times daily.    [provider]  Potassium Chloride ER 20 MEQ TBCR Take 20 mEq by mouth 2 (two) times daily.    [provider]  promethazine (PHENERGAN) 25 MG tablet Take 1 tablet (25 mg total) by mouth every 6 (six) hours as needed for nausea or vomiting. 12/25/22   Laural Benes, Clanford L, MD  ranolazine (RANEXA) 1000 MG SR tablet Take 1 tablet (1,000 mg total) by mouth 2 (two) times daily. 02/04/23   Mallipeddi, Vishnu P, MD  rOPINIRole (REQUIP) 4 MG tablet Take 1 tablet (4 mg total) by mouth 1 to 3 hours before bedtime. 06/29/22     rosuvastatin (CRESTOR) 40 MG tablet Take 1 tablet (40 mg total) by mouth at bedtime. 12/25/22   Johnson, Clanford L, MD  topiramate (TOPAMAX) 100  MG tablet TAKE 1 TABLET BY MOUTH TWICE DAILY. 05/12/22     torsemide 60 MG TABS Take 60 mg by mouth 2 (two) times daily. 03/20/23   Vassie Loll, MD  traMADol (ULTRAM) 50 MG tablet Take 1 tablet (50 mg total) by mouth daily as needed for moderate pain. 02/21/23   Johnson, Clanford L, MD  traZODone (DESYREL) 100 MG tablet Take 150 mg by mouth at bedtime.    [provider]      Allergies    Abilify [aripiprazole], Ciprofloxacin, Carbidopa-levodopa, Prednisone, and Venlafaxine    Review of Systems   Review of Systems  Physical Exam Updated Vital Signs BP (!) 125/54   Pulse 86   Resp 18   SpO2 100%  Physical Exam Vitals and nursing note reviewed.  Constitutional:      General: She is not in acute distress.    Appearance: She is well-developed.  HENT:     Head: Normocephalic. Laceration present.      Mouth/Throat:     Mouth: Mucous membranes are moist.  Eyes:     General: Vision grossly intact. Gaze aligned appropriately.     Extraocular Movements: Extraocular movements intact.     Conjunctiva/sclera: Conjunctivae normal.  Cardiovascular:     Rate and Rhythm: Normal rate and regular rhythm.     Pulses: Normal pulses.     Heart sounds: Normal heart sounds, S1 normal and S2 normal. No murmur heard.    No friction rub. No gallop.  Pulmonary:     Effort: Pulmonary effort is normal. No respiratory distress.     Breath sounds: Normal breath sounds.  Abdominal:     General: Bowel sounds are normal.     Palpations: Abdomen is soft.     Tenderness: There is no abdominal tenderness. There is no guarding or rebound.     Hernia: No hernia is present.  Musculoskeletal:        General: No swelling.     Right hand: Tenderness present. No swelling or deformity. Normal range of motion.     Left hand: Tenderness present. No swelling or deformity. Normal range of motion.     Cervical back: Full passive range of motion without pain, normal range of motion and neck supple. No spinous  process tenderness or muscular tenderness. Normal range of motion.     Right lower leg: No edema.     Left lower leg: No edema.  Skin:    General: Skin is warm and dry.     Capillary Refill: Capillary refill takes less than 2 seconds.     Findings: No ecchymosis, erythema, rash or wound.  Neurological:  General: No focal deficit present.     Mental Status: She is alert and oriented to person, place, and time.     GCS: GCS eye subscore is 4. GCS verbal subscore is 5. GCS motor subscore is 6.     Cranial Nerves: Cranial nerves 2-12 are intact.     Sensory: Sensation is intact.     Motor: Motor function is intact.     Coordination: Coordination is intact.  Psychiatric:        Attention and Perception: Attention normal.        Mood and Affect: Mood normal.        Speech: Speech normal.        Behavior: Behavior normal.     ED Results / Procedures / Treatments   Labs (all labs ordered are listed, but only abnormal results are displayed) Labs Reviewed  CBG MONITORING, ED - Abnormal; Notable for the following components:      Result Value   Glucose-Capillary 141 (*)    All other components within normal limits    EKG None  Radiology CT HEAD WO CONTRAST ( )  Result Date: 03/25/2023 CLINICAL DATA:  Head trauma, moderate-severe; Neck trauma, dangerous injury mechanism (Age 60-64y) EXAM: CT HEAD WITHOUT CONTRAST CT CERVICAL SPINE WITHOUT CONTRAST TECHNIQUE: Multidetector CT imaging of the head and cervical spine was performed following the standard protocol without intravenous contrast. Multiplanar CT image reconstructions of the cervical spine were also generated. RADIATION DOSE REDUCTION: This exam was performed according to the departmental dose-optimization program which includes automated exposure control, adjustment of the mA and/or kV according to patient size and/or use of iterative reconstruction technique. COMPARISON:  CT head and C-spine 12/21/2022 FINDINGS: CT HEAD  FINDINGS Brain: Trace patchy and confluent areas of decreased attenuation are noted throughout the deep and periventricular white matter of the cerebral hemispheres bilaterally, compatible with chronic microvascular ischemic disease. No evidence of large-territorial acute infarction. No parenchymal hemorrhage. No mass lesion. No extra-axial collection. No mass effect or midline shift. No hydrocephalus. Basilar cisterns are patent. Vascular: No hyperdense vessel. Skull: No acute fracture or focal lesion. Sinuses/Orbits: Paranasal sinuses and mastoid air cells are clear. The orbits are unremarkable. Other: None. CT CERVICAL SPINE FINDINGS Alignment: Normal. Skull base and vertebrae: Multilevel mild degenerative changes of the spine. Moderate degenerative changes at the C5-C6 level. No associated severe osseous foraminal or central canal stenosis. No acute fracture. No aggressive appearing focal osseous lesion or focal pathologic process. Soft tissues and spinal canal: No prevertebral fluid or swelling. No visible canal hematoma. Upper chest: Unremarkable. Other: Anterior vertex laceration noted with no large underlying scalp hematoma. IMPRESSION: No acute intracranial abnormality. No acute displaced fracture or traumatic listhesis of the cervical spine. Electronically Signed   By: Tish Frederickson M.D.   On: 03/25/2023 02:42   CT CERVICAL SPINE WO CONTRAST  Result Date: 03/25/2023 CLINICAL DATA:  Head trauma, moderate-severe; Neck trauma, dangerous injury mechanism (Age 25-64y) EXAM: CT HEAD WITHOUT CONTRAST CT CERVICAL SPINE WITHOUT CONTRAST TECHNIQUE: Multidetector CT imaging of the head and cervical spine was performed following the standard protocol without intravenous contrast. Multiplanar CT image reconstructions of the cervical spine were also generated. RADIATION DOSE REDUCTION: This exam was performed according to the departmental dose-optimization program which includes automated exposure control,  adjustment of the mA and/or kV according to patient size and/or use of iterative reconstruction technique. COMPARISON:  CT head and C-spine 12/21/2022 FINDINGS: CT HEAD FINDINGS Brain: Trace patchy and confluent areas of decreased  attenuation are noted throughout the deep and periventricular white matter of the cerebral hemispheres bilaterally, compatible with chronic microvascular ischemic disease. No evidence of large-territorial acute infarction. No parenchymal hemorrhage. No mass lesion. No extra-axial collection. No mass effect or midline shift. No hydrocephalus. Basilar cisterns are patent. Vascular: No hyperdense vessel. Skull: No acute fracture or focal lesion. Sinuses/Orbits: Paranasal sinuses and mastoid air cells are clear. The orbits are unremarkable. Other: None. CT CERVICAL SPINE FINDINGS Alignment: Normal. Skull base and vertebrae: Multilevel mild degenerative changes of the spine. Moderate degenerative changes at the C5-C6 level. No associated severe osseous foraminal or central canal stenosis. No acute fracture. No aggressive appearing focal osseous lesion or focal pathologic process. Soft tissues and spinal canal: No prevertebral fluid or swelling. No visible canal hematoma. Upper chest: Unremarkable. Other: Anterior vertex laceration noted with no large underlying scalp hematoma. IMPRESSION: No acute intracranial abnormality. No acute displaced fracture or traumatic listhesis of the cervical spine. Electronically Signed   By: Tish Frederickson M.D.   On: 03/25/2023 02:42   DG Hand Complete Left  Result Date: 03/25/2023 CLINICAL DATA:  Initial evaluation for acute trauma, fall. EXAM: LEFT HAND - COMPLETE 3+ VIEW COMPARISON:  None Available. FINDINGS: No acute fracture or dislocation. Joint spaces fairly well maintained. Osseous mineralization normal. No visible soft tissue injury. IMPRESSION: No acute osseous abnormality about the left hand. Electronically Signed   By: Rise Mu M.D.    On: 03/25/2023 02:32   DG Hand Complete Right  Result Date: 03/25/2023 CLINICAL DATA:  Initial evaluation for acute trauma, fall. EXAM: RIGHT HAND - COMPLETE 3+ VIEW COMPARISON:  None Available. FINDINGS: No acute fracture or dislocation. Joint spaces maintained. Tiny osseous density adjacent to the right first D IP articulation, chronic in appearance, possibly degenerative. Osseous mineralization normal. No soft tissue abnormality. IMPRESSION: No acute osseous abnormality about the right hand. Electronically Signed   By: Rise Mu M.D.   On: 03/25/2023 02:31    Procedures .Marland KitchenLaceration Repair  Date/Time: 03/25/2023 3:23 AM  Performed by: Gilda Crease, MD Authorized by: Gilda Crease, MD   Consent:    Consent obtained:  Verbal   Consent given by:  Patient   Risks, benefits, and alternatives were discussed: yes     Risks discussed:  Infection and pain Universal protocol:    Procedure explained and questions answered to patient or proxy's satisfaction: yes     Relevant documents present and verified: yes     Test results available: yes     Imaging studies available: yes     Required blood products, implants, devices, and special equipment available: yes     Site/side marked: yes     Immediately prior to procedure, a time out was called: yes     Patient identity confirmed:  Verbally with patient Anesthesia:    Anesthesia method:  Topical application   Topical anesthetic:  LET Laceration details:    Location:  Scalp   Scalp location:  Frontal   Length (cm):  3.5 Pre-procedure details:    Preparation:  Patient was prepped and draped in usual sterile fashion and imaging obtained to evaluate for foreign bodies Exploration:    Hemostasis achieved with:  LET   Contaminated: no   Treatment:    Area cleansed with:  Chlorhexidine   Irrigation solution:  Sterile saline   Irrigation method:  Syringe   Debridement:  None Skin repair:    Repair method:   Staples   Number of staples:  5 Approximation:    Approximation:  Close Repair type:    Repair type:  Simple Post-procedure details:    Dressing:  Open (no dressing)   Procedure completion:  Tolerated well, no immediate complications     Medications Ordered in ED Medications  lidocaine-EPINEPHrine (XYLOCAINE W/EPI) 2 %-1:200000 (PF) injection 10 mL (has no administration in time range)  Tdap (BOOSTRIX) injection 0.5 mL (0.5 mLs Intramuscular Given 03/25/23 0158)  lidocaine-EPINEPHrine-tetracaine (LET) topical gel (3 mLs Topical Given 03/25/23 0158)  traMADol (ULTRAM) tablet 50 mg (50 mg Oral Given 03/25/23 9629)    ED Course/ Medical Decision Making/ A&P                             Medical Decision Making Amount and/or Complexity of Data Reviewed Radiology: ordered and independent interpretation performed. Decision-making details documented in ED Course.  Risk Prescription drug management.   Differential diagnosis considered includes, but not limited to: ICH; concussion; orthopedic injury  Presents to the emergency department for evaluation of scalp laceration after a fall.  Patient with a frontal scalp laceration that was cleaned and repaired with staples.  CT head and cervical spine unremarkable.  Patient reports that she fell onto both of her hands and they were both hurting.  No deformity noted.  X-ray of hands negative bilaterally.  No other injuries noted.        Final Clinical Impression(s) / ED Diagnoses Final diagnoses:  Laceration of scalp, initial encounter  Contusion of hand, unspecified laterality, initial encounter    Rx / DC Orders ED Discharge Orders     None         Caitland Porchia, Canary Brim, MD 03/25/23 [phone]

## 2023-03-30 ENCOUNTER — Encounter (HOSPITAL_COMMUNITY): Payer: Self-pay | Admitting: Cardiology

## 2023-03-30 ENCOUNTER — Ambulatory Visit (HOSPITAL_COMMUNITY)
Admission: RE | Admit: 2023-03-30 | Discharge: 2023-03-30 | Disposition: A | Discharge status: Home / Self Care | Payer: Medicare Other | Source: Ambulatory Visit | Attending: Cardiology | Admitting: Cardiology

## 2023-03-30 VITALS — BP 120/64 | HR 86 | Wt 148.4 lb

## 2023-03-30 DIAGNOSIS — Z5986 Financial insecurity: Secondary | ICD-10-CM | POA: Insufficient documentation

## 2023-03-30 DIAGNOSIS — F17290 Nicotine dependence, other tobacco product, uncomplicated: Secondary | ICD-10-CM | POA: Diagnosis not present

## 2023-03-30 DIAGNOSIS — E785 Hyperlipidemia, unspecified: Secondary | ICD-10-CM | POA: Insufficient documentation

## 2023-03-30 DIAGNOSIS — N184 Chronic kidney disease, stage 4 (severe): Secondary | ICD-10-CM | POA: Insufficient documentation

## 2023-03-30 DIAGNOSIS — Z951 Presence of aortocoronary bypass graft: Secondary | ICD-10-CM | POA: Diagnosis not present

## 2023-03-30 DIAGNOSIS — I5033 Acute on chronic diastolic (congestive) heart failure: Secondary | ICD-10-CM | POA: Diagnosis not present

## 2023-03-30 DIAGNOSIS — Z8249 Family history of ischemic heart disease and other diseases of the circulatory system: Secondary | ICD-10-CM | POA: Insufficient documentation

## 2023-03-30 DIAGNOSIS — I5032 Chronic diastolic (congestive) heart failure: Secondary | ICD-10-CM | POA: Diagnosis not present

## 2023-03-30 DIAGNOSIS — I2510 Atherosclerotic heart disease of native coronary artery without angina pectoris: Secondary | ICD-10-CM | POA: Insufficient documentation

## 2023-03-30 DIAGNOSIS — Z833 Family history of diabetes mellitus: Secondary | ICD-10-CM | POA: Insufficient documentation

## 2023-03-30 DIAGNOSIS — N1832 Chronic kidney disease, stage 3b: Secondary | ICD-10-CM | POA: Diagnosis not present

## 2023-03-30 DIAGNOSIS — Z7984 Long term (current) use of oral hypoglycemic drugs: Secondary | ICD-10-CM | POA: Insufficient documentation

## 2023-03-30 DIAGNOSIS — I130 Hypertensive heart and chronic kidney disease with heart failure and stage 1 through stage 4 chronic kidney disease, or unspecified chronic kidney disease: Secondary | ICD-10-CM | POA: Diagnosis not present

## 2023-03-30 DIAGNOSIS — Z79899 Other long term (current) drug therapy: Secondary | ICD-10-CM | POA: Insufficient documentation

## 2023-03-30 DIAGNOSIS — Z955 Presence of coronary angioplasty implant and graft: Secondary | ICD-10-CM | POA: Insufficient documentation

## 2023-03-30 DIAGNOSIS — E1122 Type 2 diabetes mellitus with diabetic chronic kidney disease: Secondary | ICD-10-CM | POA: Diagnosis not present

## 2023-03-30 LAB — BASIC METABOLIC PANEL WITH GFR
Anion gap: 8 (ref 5–15)
BUN: 40 mg/dL — ABNORMAL HIGH (ref 8–23)
CO2: 25 mmol/L (ref 22–32)
Calcium: 9.3 mg/dL (ref 8.9–10.3)
Chloride: 103 mmol/L (ref 98–111)
Creatinine, Ser: 2.35 mg/dL — ABNORMAL HIGH (ref 0.44–1.00)
GFR, Estimated: 23 mL/min — ABNORMAL LOW
Glucose, Bld: 107 mg/dL — ABNORMAL HIGH (ref 70–99)
Potassium: 4.7 mmol/L (ref 3.5–5.1)
Sodium: 136 mmol/L (ref 135–145)

## 2023-03-30 LAB — BRAIN NATRIURETIC PEPTIDE: B Natriuretic Peptide: 67.9 pg/mL (ref 0.0–100.0)

## 2023-03-30 NOTE — Progress Notes (Signed)
ReDS Vest / Clip - 03/30/23 0900       ReDS Vest / Clip   Station Marker A    Ruler Value 28    ReDS Value Range Low volume    ReDS Actual Value 31

## 2023-03-30 NOTE — Progress Notes (Signed)
HFMS order placed through [employer] per Dr. Gasper Lloyd. Demographics, insurance info, and OV note uploaded to [employer]. Pt given HFMS educational packet.

## 2023-03-30 NOTE — Patient Instructions (Addendum)
Medication Changes:  TAKE ONE DOSE OF METOLAZONE 5 mg TODAY   Lab Work:  Labs done today, your results will be available in MyChart, we will contact you for abnormal readings.   Testing/Procedures:  Your provider has recommended a HFMS (heart failure monitoring system) device for you. This device is an external patch that you wear on your left side for up to 3 months. It monitors and alerts [place] if there are changes in your fluid levels. Once approved by your insurance company the [employer] company will reach out to you and ship the device. They will walk you through the placement process and answer any questions you have.  If your fluid level increases our office is notified. We will then reach out to you for further instructions.   Referrals:  None Ordered At This Time.   Special Instructions // Education:  None Ordered At This Time.   Follow-Up in: ONE MONTH   At the [facility], you and your health needs are our priority. We have a designated team specialized in the treatment of Heart Failure. This Care Team includes your primary Heart Failure Specialized Cardiologist (physician), Advanced Practice Providers (APPs- Physician Assistants and Nurse Practitioners), and Pharmacist who all work together to provide you with the care you need, when you need it.   You may see any of the following providers on your designated Care Team at your next follow up:  Dr. Arvilla Meres Dr. Marca Ancona Dr. Marcos Eke, NP Robbie Lis, [place] [facility] [place], [place] Brynda Peon, NP Karle Plumber, PharmD   Please be sure to bring in all your medications bottles to every appointment.   Need to Contact us:  If you have any questions or concerns before your next appointment please send [place] a message through [place] or call our office at [phone].    TO LEAVE A MESSAGE FOR THE NURSE SELECT OPTION 2, PLEASE LEAVE A MESSAGE INCLUDING: YOUR  NAME DATE OF BIRTH CALL BACK NUMBER REASON FOR CALL**this is important as we prioritize the call backs  YOU WILL RECEIVE A CALL BACK THE SAME DAY AS LONG AS YOU CALL BEFORE 4:00 PM

## 2023-03-30 NOTE — Progress Notes (Signed)
ADVANCED HEART FAILURE CLINIC NOTE  Referring Physician: Benita Stabile, MD  Primary Care: Benita Stabile, MD Primary Cardiologist: Luane School, MD  HPI: Meghan Terry is a 62 y.o. female with CAD status post CABG in 2020 with LIMA to LAD, SVG to OM and SVG to PDA, history of PCI x 3 to the RCA, hypertension, hyperlipidemia, type 2 diabetes, stage IV CKD, chronic anemia and heart failure with preserved ejection fraction presenting today for follow up.  She was recently admitted to the hospital after presenting to outpatient clinic visit with decompensated HFpEF.  She was diuresed 4.5 L with IV Lasix and discharged home. During our last appointment, we increased diuretics, however, she once more had another episode of decompensated HFpEF leading to admission in 6/24; now over 5 hospitalizations for HFpEF in the past 1 year.   Interval hx:  - Despite taking torsemide 100mg  BID she was admitted in June 2024 with severe volume overload now s/p IV diuresis with resolution of LE edema and dyspnea. Now 5+ hospitalizations.  - Today she is taking torsemide 60mg  BID with metolazone 5mg  on Wednesdays. Currently holding [place].    Past Medical History:  Diagnosis Date   Acute diastolic CHF (congestive heart failure) (HCC) 06/08/2021   Acute pulmonary edema (HCC)    Anemia due to stage 4 chronic kidney disease (HCC) 07/20/2022   Angina pectoris (HCC) 11/17/2019   Anxiety    Atypical chest pain 12/30/2020   Benign hypertension with CKD (chronic kidney disease) stage IV (HCC) 07/20/2022   CHF (congestive heart failure) (HCC) 06/08/2021   CKD (chronic kidney disease) stage 3b, GFR 30-59 ml/min 12/03/2019   Coronary artery disease    Coronary artery disease involving native coronary artery of native heart with angina pectoris (HCC) 10/03/2019   Depression 04/25/2017   Diabetes mellitus due to underlying condition with unspecified complications (HCC) 09/05/2019   Diabetes mellitus with stage 4  chronic kidney disease GFR 15-29 (HCC) 07/20/2022   Essential hypertension 09/05/2019   Ex-smoker 09/05/2019   Family history of coronary artery disease 09/05/2019   History of depression 04/15/2020   Hx of diabetes mellitus 04/15/2020   Hyperlipidemia with target LDL less than 70 11/24/2019   Hyperphosphatemia 07/20/2022   Hypertension    Hypokalemia 04/30/2022   Hypothyroid    Migraine 04/25/2017   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   Syncope and collapse 04/29/2022   Thyroid disease    UTI (urinary tract infection) 04/30/2022    Current Outpatient Medications  Medication Sig Dispense Refill   acetaminophen (TYLENOL) 500 MG tablet Take 2 tablets by mouth 3 times daily as needed for pain 100 tablet 0   aspirin EC 81 MG tablet Take 1 tablet (81 mg total) by mouth daily. 90 tablet 3   buPROPion (WELLBUTRIN XL) 300 MG 24 hr tablet Take 1 tablet (300 mg total) by mouth daily. 90 tablet 2   cholecalciferol 25 MCG (1000 UT) tablet Take 1 tablet (1,000 Units total) by mouth in the morning and at bedtime.     clopidogrel (PLAVIX) 75 MG tablet TAKE 1 TABLET BY MOUTH DAILY 90 tablet 2   cyanocobalamin (VITAMIN  B12) 1000 MCG tablet Take 1 tablet (1,000 mcg total) by mouth daily. 30 tablet 1   ferrous sulfate 325 (65 FE) MG EC tablet Take 325 mg by mouth 2 (two) times daily.     folic acid (FOLVITE) 1 MG tablet Take 1 tablet (1 mg total) by mouth daily. 30 tablet 1   gabapentin (NEURONTIN) 100 MG capsule Take 3 capsules (300 mg total) by mouth at bedtime.     levothyroxine (SYNTHROID) 50 MCG tablet Take 1 tablet (50 mcg total) by mouth See admin instructions. Take 1 tablet in the  morning with breakfast on SATURDAY AND SUNDAY.     levothyroxine (SYNTHROID) 75 MCG tablet Take 1 tablet (75 mcg total) by mouth daily Monday thru Friday. 60 tablet 3   linaclotide (LINZESS) 145 MCG CAPS capsule Take 145 mcg by mouth every other day.     metolazone (ZAROXOLYN) 5 MG tablet Once a week on Wednesdays if you gain weight or swelling appreciated. 12 tablet 1   nitroGLYCERIN (NITROSTAT) 0.4 MG SL tablet Place 1 tablet (0.4 mg total) under the tongue every 5 (five) minutes as needed for chest pain. 25 tablet 7   ondansetron (ZOFRAN) 4 MG tablet take 1 tablet (4 mg) by mouth 2 times per day as needed for nausea 180 tablet 1   pantoprazole (PROTONIX) 40 MG tablet Take 1 tablet (40 mg total) by mouth 2 (two) times daily. 60 tablet 1   Polyvinyl Alcohol-Povidone (REFRESH OP) Apply 1 drop to eye 4 (four) times daily.     Potassium Chloride ER 20 MEQ TBCR Take 20 mEq by mouth 2 (two) times daily.     promethazine (PHENERGAN) 25 MG tablet Take 1 tablet (25 mg total) by mouth every 6 (six) hours as needed for nausea or vomiting.     ranolazine (RANEXA) 1000 MG SR tablet Take 1 tablet (1,000 mg total) by mouth 2 (two) times daily. 180 tablet 3   rOPINIRole (REQUIP) 4 MG tablet Take 1 tablet (4 mg total) by mouth 1 to 3 hours before bedtime. 90 tablet 0   rosuvastatin (CRESTOR) 40 MG tablet Take 1 tablet (40 mg total) by mouth at bedtime.     topiramate (TOPAMAX) 100 MG tablet TAKE 1 TABLET BY MOUTH TWICE DAILY. 180 tablet 3   torsemide 60 MG TABS Take 60 mg by mouth 2 (two) times daily. 60 tablet 2   traMADol (ULTRAM) 50 MG tablet Take 1 tablet (50 mg total) by mouth daily as needed for moderate pain.     traZODone (DESYREL) 100 MG tablet Take 150 mg by mouth at bedtime.     dapagliflozin propanediol (FARXIGA) 10 MG TABS tablet Take 1 tablet (10 mg total) by mouth daily before breakfast. Hold medication until follow-up with cardiology service. (Patient not taking: Reported on 03/30/2023)      metoprolol succinate (TOPROL-XL) 25 MG 24 hr tablet Take 0.5 tablets (12.5 mg total) by mouth at bedtime. Take with or immediately following a meal. (Patient not taking: Reported on 03/30/2023) 90 tablet 3   No current facility-administered medications for this encounter.    Allergies  Allergen Reactions   Abilify [Aripiprazole]    Ciprofloxacin Nausea And Vomiting   Carbidopa-Levodopa Other (See Comments)    Developed tics while taking    Prednisone Other (See Comments)    Turns red all over    Venlafaxine Other (See Comments)    Developed tics while taking - reaction to Effexor  Social History   Socioeconomic History   Marital status: Married    Spouse name: Jamiesha Marchiano   Number of children: 2   Years of education: Not on file   Highest education level: Associate degree: occupational, Scientist, product/process development, or vocational program  Occupational History   Occupation: disabilty    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  Tobacco Use   Smoking status: Former    Packs/day: 1.00    Years: 47.00    Additional pack years: 0.00    Total pack years: 47.00    Types: Cigarettes    Quit date: 09/15/2019    Years since quitting: 3.5    Passive exposure: Past   Smokeless tobacco: Never  Vaping Use   Vaping Use: Every day   Start date: 09/15/2019   Substances: Nicotine  Substance and Sexual Activity   Alcohol use: No   Drug use: No   Sexual activity: Not on file  Other Topics Concern   Not on file  Social History Narrative   Not on file   Social Determinants of Health   Financial Resource Strain: High Risk (06/19/2021)   Overall Financial Resource Strain (CARDIA)    Difficulty of Paying Living Expenses: Very hard  Food Insecurity: No Food Insecurity (03/17/2023)   Hunger Vital Sign    Worried About Running Out of Food in the Last Year: Never true    Ran Out of Food in the Last Year: Never true  Transportation Needs: No Transportation Needs (03/17/2023)   PRAPARE - Therapist, art (Medical): No    Lack of Transportation (Non-Medical): No  Physical Activity: Not on file  Stress: Not on file  Social Connections: Not on file  Intimate Partner Violence: Not At Risk (03/17/2023)   Humiliation, Afraid, Rape, and Kick questionnaire    Fear of Current or Ex-Partner: No    Emotionally Abused: No    Physically Abused: No    Sexually Abused: No      Family History  Problem Relation Age of Onset   Asthma Mother    COPD Mother    Diabetes Mother    Macular degeneration Mother    Congestive Heart Failure Father     PHYSICAL EXAM: Vitals:   03/30/23 0855  BP: 120/64  Pulse: 86  SpO2: 100%   GENERAL: Well nourished, well developed, and in no apparent distress at rest.  HEENT: Negative for arcus senilis or xanthelasma. There is no scleral icterus.  The mucous membranes are pink and moist.   NECK: Supple, No masses. Normal carotid upstrokes without bruits. No masses or thyromegaly.    CHEST: There are no chest wall deformities. There is no chest wall tenderness. Respirations are unlabored.  Lungs- CTA B/L CARDIAC:  JVP: 8 cm          Normal rate with regular rhythm. No murmurs, rubs or gallops.  Pulses are 2+ and symmetrical in upper and lower extremities. 1+ edema.  ABDOMEN: Soft, non-tender, non-distended. There are no masses or hepatomegaly. There are normal bowel sounds.  EXTREMITIES: Warm and well perfused with no cyanosis, clubbing.  LYMPHATIC: No axillary or supraclavicular lymphadenopathy.  NEUROLOGIC: Patient is oriented x3 with no focal or lateralizing neurologic deficits.  PSYCH: Patients affect is appropriate, there is no evidence of anxiety or depression.  SKIN: Warm and dry; no lesions or wounds.    DATA REVIEW  ECG: 02/18/23: NSR  As per my personal interpretation  ECHO: 12/22/22: LVEF 60-65%, grade II  DD; normal RV function.   CATH: 01/18/23: Overall, no significant change when compared to angiography performed in June  2021. LIMA to LAD is patent. Bypass graft to circumflex is patent.  Distribution of native vessel after the insertion is very small.  The native vessel diameter is minuscule. Bypass graft to PDA is patent.  PDA is totally occluded at its ostium and is jailed by stent. Left main is widely patent. LAD is severely and diffusely diseased with 70 to 80% stenosis from proximal to mid.  Competitive flow is noted in the mid LAD due to LIMA graft insertion. Circumflex is totally occluded in the mid vessel.  Very faint collaterals are noted to small "threadlike vessels". Right coronary is widely patent and has a heavy burden of stents that extends from the mid vessel into the continuation beyond the origin of the PDA.  The PDA is jailed.  No significant obstruction is noted in the right coronary other than the PDA which is supplied by a graft. Hyperdynamic left ventricle.  EF greater than 70%.  LVEDP is normal.   ASSESSMENT & PLAN:  Heart failure with preserved EF - Recurrent hospital admissions for HFpEF - Will hold off on spironolactone due to fluctuating sCr over the past several lab checks and CKD IV; can start in the future.  - Strongly considering use of cardiomems due to recurrent admissions.  - Re-admitted in June 2024 with decompensated HFpEF despite compliance with diuretics. She has now had frequent hospital admissions. Will need closer monitoring of volume status. Plan for placement of Zoll HFMS.   - Today she feels at her baseline with a weight of 148lbs; REDs 32% with JVP 7cm; euvolemic on exam. Will attempt to maintain weight ~148lbs.  - Currently taking torsemide 60mg  BID and metolazone once weekly on Wednesdays.  - Repeat lab work today; plan to start farxiga or jardiance 10mg  pending labs.   2. CAD s/p CABG - 3V CABG w/ LIMA to LAD, SVG-OM, SVG-PDA - Ranexa 1000mg  BID and metoprolol succinate 12.5mg  qhs.  - Continue follow up with general cardiology   3. Hyperlipidemia -  rosuvastatin 40mg  nightly   [facility] Advanced Heart Failure Mechanical Circulatory Support

## 2023-03-30 NOTE — Addendum Note (Signed)
Encounter addended by: Suezanne Cheshire, RN on: 03/30/2023 11:57 AM  Actions taken: Charge Capture section accepted

## 2023-03-31 DIAGNOSIS — I2510 Atherosclerotic heart disease of native coronary artery without angina pectoris: Secondary | ICD-10-CM | POA: Diagnosis not present

## 2023-03-31 DIAGNOSIS — S0181XA Laceration without foreign body of other part of head, initial encounter: Secondary | ICD-10-CM | POA: Diagnosis not present

## 2023-03-31 DIAGNOSIS — Z79899 Other long term (current) drug therapy: Secondary | ICD-10-CM | POA: Diagnosis not present

## 2023-03-31 DIAGNOSIS — I5033 Acute on chronic diastolic (congestive) heart failure: Secondary | ICD-10-CM | POA: Diagnosis not present

## 2023-03-31 DIAGNOSIS — I110 Hypertensive heart disease with heart failure: Secondary | ICD-10-CM | POA: Diagnosis not present

## 2023-03-31 DIAGNOSIS — X58XXXA Exposure to other specified factors, initial encounter: Secondary | ICD-10-CM | POA: Diagnosis not present

## 2023-04-02 DIAGNOSIS — I5031 Acute diastolic (congestive) heart failure: Secondary | ICD-10-CM | POA: Diagnosis not present

## 2023-04-05 ENCOUNTER — Encounter (HOSPITAL_COMMUNITY)
Admission: RE | Admit: 2023-04-05 | Discharge: 2023-04-05 | Disposition: A | Discharge status: Home / Self Care | Payer: Medicare Other | Source: Ambulatory Visit | Attending: Nephrology | Admitting: Nephrology

## 2023-04-05 VITALS — BP 122/54 | HR 94 | Temp 98.1°F | Resp 16

## 2023-04-05 DIAGNOSIS — D631 Anemia in chronic kidney disease: Secondary | ICD-10-CM | POA: Insufficient documentation

## 2023-04-05 DIAGNOSIS — N184 Chronic kidney disease, stage 4 (severe): Secondary | ICD-10-CM | POA: Diagnosis not present

## 2023-04-05 LAB — POCT HEMOGLOBIN-HEMACUE: Hemoglobin: 9.6 g/dL — ABNORMAL LOW (ref 12.0–15.0)

## 2023-04-05 MED ORDER — EPOETIN ALFA-EPBX 4000 UNIT/ML IJ SOLN
4000.0000 [IU] | Freq: Once | INTRAMUSCULAR | Status: AC
  Administered 2023-04-05: 4000 [IU] via SUBCUTANEOUS

## 2023-04-05 NOTE — Progress Notes (Signed)
Diagnosis: Anemia in Chronic Kidney Disease  Provider:  Celso Amy MD  Procedure: Injection  Retacrit (epoetin alfa-epbx), Dose: 4000 Units, Site: subcutaneous, Number of injections: 1  Post Care: Patient declined observation  Discharge: Condition: Good, Destination: Home . AVS Provided  Performed by:  Claudia Desanctis, RN

## 2023-04-06 DIAGNOSIS — I5032 Chronic diastolic (congestive) heart failure: Secondary | ICD-10-CM | POA: Diagnosis not present

## 2023-04-06 DIAGNOSIS — E1122 Type 2 diabetes mellitus with diabetic chronic kidney disease: Secondary | ICD-10-CM | POA: Diagnosis not present

## 2023-04-06 DIAGNOSIS — D638 Anemia in other chronic diseases classified elsewhere: Secondary | ICD-10-CM | POA: Diagnosis not present

## 2023-04-06 DIAGNOSIS — N184 Chronic kidney disease, stage 4 (severe): Secondary | ICD-10-CM | POA: Diagnosis not present

## 2023-04-15 DIAGNOSIS — G4733 Obstructive sleep apnea (adult) (pediatric): Secondary | ICD-10-CM | POA: Diagnosis not present

## 2023-04-15 DIAGNOSIS — G4761 Periodic limb movement disorder: Secondary | ICD-10-CM | POA: Diagnosis not present

## 2023-04-15 DIAGNOSIS — J479 Bronchiectasis, uncomplicated: Secondary | ICD-10-CM | POA: Diagnosis not present

## 2023-04-15 DIAGNOSIS — R400 Somnolence: Secondary | ICD-10-CM | POA: Diagnosis not present

## 2023-04-19 ENCOUNTER — Encounter (HOSPITAL_COMMUNITY)
Admission: RE | Admit: 2023-04-19 | Discharge: 2023-04-19 | Disposition: A | Discharge status: Home / Self Care | Payer: Medicare Other | Source: Ambulatory Visit | Attending: Nephrology | Admitting: Nephrology

## 2023-04-19 VITALS — BP 152/62 | HR 90 | Temp 98.2°F | Resp 20

## 2023-04-19 DIAGNOSIS — D631 Anemia in chronic kidney disease: Secondary | ICD-10-CM | POA: Diagnosis not present

## 2023-04-19 DIAGNOSIS — N184 Chronic kidney disease, stage 4 (severe): Secondary | ICD-10-CM | POA: Diagnosis not present

## 2023-04-19 LAB — POCT HEMOGLOBIN-HEMACUE: Hemoglobin: 9.5 g/dL — ABNORMAL LOW (ref 12.0–15.0)

## 2023-04-19 MED ORDER — EPOETIN ALFA-EPBX 4000 UNIT/ML IJ SOLN
4000.0000 [IU] | Freq: Once | INTRAMUSCULAR | Status: AC
  Administered 2023-04-19: 4000 [IU] via SUBCUTANEOUS

## 2023-04-19 NOTE — Progress Notes (Signed)
Diagnosis: Anemia in Chronic Kidney Disease  Provider:  Celso Amy MD  Procedure: Injection  Retacrit (epoetin alfa-epbx), Dose: 4000 Units, Site: subcutaneous, Number of injections: 1  Hgb. 9.5  Post Care: Observation period completed  Discharge: Condition: Good, Destination: Home . AVS Provided  Performed by:  Daleen Squibb, RN

## 2023-04-20 ENCOUNTER — Telehealth: Payer: Self-pay | Admitting: Neurology

## 2023-04-20 DIAGNOSIS — R410 Disorientation, unspecified: Secondary | ICD-10-CM

## 2023-04-20 NOTE — Telephone Encounter (Signed)
Pt states the clips have dissolved In her stomach , and that Dr Terrace Arabia can now place the order the MRI

## 2023-04-28 ENCOUNTER — Telehealth: Payer: Self-pay | Admitting: Pharmacy Technician

## 2023-04-28 NOTE — Addendum Note (Signed)
Addended by: Levert Feinstein on: 04/28/2023 08:56 AM   Modules accepted: Orders

## 2023-04-28 NOTE — Telephone Encounter (Signed)
Auth Submission: NO AUTH NEEDED Site of care: Site of care: [facility] Payer: [employer] Medicare Medication & CPT/J Code(s) submitted: Retacrit [id number] Route of submission (phone, fax, portal):  Phone # Fax # Auth type: Buy/Bill PB Units/visits requested: 4000u q2weeks Reference number: [id number] Approval from: 04/28/2023 to 09/28/2023

## 2023-04-28 NOTE — Telephone Encounter (Signed)
[employer] medicare NPR sent to GI [phone] 

## 2023-04-28 NOTE — Telephone Encounter (Signed)
Orders Placed This Encounter  Procedures   MR BRAIN WO CONTRAST      

## 2023-04-29 DIAGNOSIS — G4733 Obstructive sleep apnea (adult) (pediatric): Secondary | ICD-10-CM | POA: Insufficient documentation

## 2023-04-30 ENCOUNTER — Ambulatory Visit: Payer: Medicare Other | Attending: Internal Medicine | Admitting: Internal Medicine

## 2023-04-30 ENCOUNTER — Encounter: Payer: Self-pay | Admitting: Internal Medicine

## 2023-04-30 VITALS — BP 108/58 | HR 82 | Ht 63.0 in | Wt 157.0 lb

## 2023-04-30 DIAGNOSIS — I5032 Chronic diastolic (congestive) heart failure: Secondary | ICD-10-CM

## 2023-04-30 DIAGNOSIS — N1832 Chronic kidney disease, stage 3b: Secondary | ICD-10-CM | POA: Diagnosis not present

## 2023-04-30 DIAGNOSIS — E7849 Other hyperlipidemia: Secondary | ICD-10-CM

## 2023-04-30 DIAGNOSIS — I10 Essential (primary) hypertension: Secondary | ICD-10-CM | POA: Diagnosis not present

## 2023-04-30 DIAGNOSIS — I25119 Atherosclerotic heart disease of native coronary artery with unspecified angina pectoris: Secondary | ICD-10-CM | POA: Diagnosis not present

## 2023-04-30 DIAGNOSIS — Z951 Presence of aortocoronary bypass graft: Secondary | ICD-10-CM

## 2023-04-30 MED ORDER — MIDODRINE HCL 5 MG PO TABS: 5.0000 mg | ORAL_TABLET | Freq: Three times a day (TID) | ORAL | 3 refills | Status: DC

## 2023-04-30 MED ORDER — ISOSORBIDE MONONITRATE ER 30 MG PO TB24: 15.0000 mg | ORAL_TABLET | Freq: Every day | ORAL | 3 refills | Status: DC

## 2023-04-30 NOTE — Patient Instructions (Signed)
Medication Instructions:  Your physician has recommended you make the following change in your medication:   -Start Midodrine 5 mg three times daily -Start Imdur 15 mg tablet once daily- ONLY IF TOP NUMBER OF BP IS GREATER THAN 110.  *If you need a refill on your cardiac medications before your next appointment, please call your pharmacy*   Lab Work: None If you have labs (blood work) drawn today and your tests are completely normal, you will receive your results only by: MyChart Message (if you have MyChart) OR A paper copy in the mail If you have any lab test that is abnormal or we need to change your treatment, we will call you to review the results.   Testing/Procedures: None   Follow-Up: At [facility], you and your health needs are our priority.  As part of our continuing mission to provide you with exceptional heart care, we have created designated Provider Care Teams.  These Care Teams include your primary Cardiologist (physician) and Advanced Practice Providers (APPs -  Physician Assistants and Nurse Practitioners) who all work together to provide you with the care you need, when you need it.  We recommend signing up for the patient portal called "MyChart".  Sign up information is provided on this After Visit Summary.  MyChart is used to connect with patients for Virtual Visits (Telemedicine).  Patients are able to view lab/test results, encounter notes, upcoming appointments, etc.  Non-urgent messages can be sent to your provider as well.   To learn more about what you can do with MyChart, go to [email or web address].    Your next appointment:   6 month(s)  Provider:   Luane School, MD    Other Instructions

## 2023-05-03 ENCOUNTER — Encounter: Payer: Medicare Other | Attending: Nephrology | Admitting: *Deleted

## 2023-05-03 ENCOUNTER — Inpatient Hospital Stay (HOSPITAL_COMMUNITY): Admission: RE | Admit: 2023-05-03 | Payer: Medicare Other | Source: Ambulatory Visit

## 2023-05-03 VITALS — BP 144/68 | HR 92 | Temp 97.5°F | Resp 18

## 2023-05-03 DIAGNOSIS — N184 Chronic kidney disease, stage 4 (severe): Secondary | ICD-10-CM | POA: Insufficient documentation

## 2023-05-03 DIAGNOSIS — R079 Chest pain, unspecified: Secondary | ICD-10-CM | POA: Insufficient documentation

## 2023-05-03 DIAGNOSIS — D631 Anemia in chronic kidney disease: Secondary | ICD-10-CM | POA: Diagnosis not present

## 2023-05-03 DIAGNOSIS — I5031 Acute diastolic (congestive) heart failure: Secondary | ICD-10-CM | POA: Diagnosis not present

## 2023-05-03 MED ORDER — EPOETIN ALFA-EPBX 4000 UNIT/ML IJ SOLN
4000.0000 [IU] | Freq: Once | INTRAMUSCULAR | Status: DC
  Filled 2023-05-03: qty 1

## 2023-05-03 NOTE — Progress Notes (Signed)
Cardiology Office Note  Date: 05/03/2023   ID: Meghan Terry, DOB Apr 17, 1961, MRN [id number]  PCP:  Benita Stabile, MD  Cardiologist:  Marjo Bicker, MD Electrophysiologist:  None    History of Present Illness: Meghan Terry is a 62 y.o. female known to have CAD s/p 3v CABG (LIMA to LAD, SVG to OM and SVG to PDA) with normal LVEF, HTN, DM 2, HLD, CKD stage IIIa is here for follow-up visit.  Patient underwent LHC in 2021 and 2022 that showed patent LIMA to LAD, patent graft to the PDA and OM. Native multivessel CAD is unchanged compared to 2021 LHC. She does have severe diffusely diseased LAD from proximal to mid after which competitive flow is noted in the mid LAD to LIMA graft insertion, CTO of LCx with very faint collaterals noted and heavy burden of stents in RCA from the mid vessel into the continuation beyond the origin of the PDA, the PDA is jailed no significant obstruction is noted in the RCA other than the PDA which was supplied by the graft. Patient is here for visit. Daughter is the power of attorney. Patient was admitted with ADHF in 12/2022 and 01/2023.  She is here for follow-up visit.  After the p.o. torsemide dose was increased from 40 mg to 60 mg with addition of metolazone, her symptoms of DOE and leg swelling seem to improved significantly.  Continues to have chest pains almost daily with exertion.  Discontinued metoprolol due to soft blood pressures.  Not taking sublingual nitroglycerin due to concern for low blood pressures.  Seen by advanced heart failure recently in 7/24 and placed a order for HFMS.  Past Medical History:  Diagnosis Date   Acute diastolic CHF (congestive heart failure) (HCC) 06/08/2021   Acute pulmonary edema (HCC)    Anemia due to stage 4 chronic kidney disease (HCC) 07/20/2022   Angina pectoris (HCC) 11/17/2019   Anxiety    Atypical chest pain 12/30/2020   Benign hypertension with CKD (chronic kidney disease) stage IV (HCC) 07/20/2022   CHF  (congestive heart failure) (HCC) 06/08/2021   CKD (chronic kidney disease) stage 3b, GFR 30-59 ml/min 12/03/2019   Coronary artery disease    Coronary artery disease involving native coronary artery of native heart with angina pectoris (HCC) 10/03/2019   Depression 04/25/2017   Diabetes mellitus due to underlying condition with unspecified complications (HCC) 09/05/2019   Diabetes mellitus with stage 4 chronic kidney disease GFR 15-29 (HCC) 07/20/2022   Essential hypertension 09/05/2019   Ex-smoker 09/05/2019   Family history of coronary artery disease 09/05/2019   History of depression 04/15/2020   Hx of diabetes mellitus 04/15/2020   Hyperlipidemia with target LDL less than 70 11/24/2019   Hyperphosphatemia 07/20/2022   Hypertension    Hypokalemia 04/30/2022   Hypothyroid    Migraine 04/25/2017   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   Syncope and collapse 04/29/2022   Thyroid disease    UTI (urinary tract infection) 04/30/2022    Past Surgical History:  Procedure Laterality Date   ABDOMINAL HYSTERECTOMY     APPENDECTOMY     BIOPSY  12/23/2022   Procedure: BIOPSY;  Surgeon: Corbin Ade, MD;  [facility]: [facility];  Service: Endoscopy;;   CARDIAC CATHETERIZATION  11/22/2019   CHOLECYSTECTOMY     COLONOSCOPY     COLONOSCOPY WITH PROPOFOL N/A 12/25/2022   Procedure: COLONOSCOPY WITH PROPOFOL;  Surgeon: Dolores Frame, MD;  Location: [facility];  Service: Gastroenterology;  Laterality: N/A;   CORONARY ARTERY BYPASS GRAFT N/A 09/15/2019   Procedure: CORONARY ARTERY BYPASS GRAFTING (CABG) times three on pump using left internal mammary artery and right and  left greater saphenous veins harvested endoscopically;  Surgeon: Alleen Borne, MD;  Location: [facility];  Service: Open Heart Surgery;  Laterality: N/A;   CORONARY STENT INTERVENTION N/A 11/23/2019   Procedure: CORONARY STENT INTERVENTION;  Surgeon: Lennette Bihari, MD;  Location: [facility];  Service: Cardiovascular;  Laterality: N/A;   ESOPHAGEAL DILATION N/A 12/23/2022   Procedure: ESOPHAGEAL DILATION;  Surgeon: Corbin Ade, MD;  Location: [facility];  Service: Endoscopy;  Laterality: N/A;   ESOPHAGOGASTRODUODENOSCOPY (EGD) WITH PROPOFOL N/A 12/23/2022   Procedure: ESOPHAGOGASTRODUODENOSCOPY (EGD) WITH PROPOFOL;  Surgeon: Corbin Ade, MD;  [facility]: [facility];  Service: Endoscopy;  Laterality: N/A;   KNEE ARTHROSCOPY     LEFT HEART CATH AND CORONARY ANGIOGRAPHY N/A 09/14/2019   Procedure: LEFT HEART CATH AND CORONARY ANGIOGRAPHY;  Surgeon: Corky Crafts, MD;  Location: [facility] CV [facility];  Service: Cardiovascular;  Laterality: N/A;   LEFT HEART CATH AND CORS/GRAFTS ANGIOGRAPHY N/A 11/22/2019   Procedure: LEFT HEART CATH AND CORS/GRAFTS ANGIOGRAPHY;  Surgeon: Lennette Bihari, MD;  Location: [facility];  Service: Cardiovascular;  Laterality: N/A;   LEFT HEART CATH AND CORS/GRAFTS ANGIOGRAPHY N/A 03/13/2020   Procedure: LEFT HEART CATH AND CORS/GRAFTS ANGIOGRAPHY;  Surgeon: Runell Gess, MD;  Location: [facility];  Service: Cardiovascular;  Laterality: N/A;   LEFT HEART CATH AND CORS/GRAFTS ANGIOGRAPHY N/A 12/31/2020   Procedure: LEFT HEART CATH AND CORS/GRAFTS ANGIOGRAPHY;  Surgeon: Lyn Records, MD;  Location: [facility];  Service: Cardiovascular;  Laterality: N/A;   OSTEOCHONDRAL DEFECT REPAIR/RECONSTRUCTION Left 11/29/2014   Procedure: LEFT ANKLE MEDIAL MALLEOLUS OSTEOTOMY,AUTO GRAFT FROM CALCANEOUS;  OS TIBIA DENORO GRAFTING TALUS;  Surgeon: Toni Arthurs, MD;  Location: [facility];  Service: Orthopedics;  Laterality: Left;    POLYPECTOMY  12/25/2022   Procedure: POLYPECTOMY;  Surgeon: Dolores Frame, MD;  [facility];  Service: Gastroenterology;;   TEE WITHOUT CARDIOVERSION N/A 09/15/2019   Procedure: TRANSESOPHAGEAL ECHOCARDIOGRAM (TEE);  Surgeon: Alleen Borne, MD;  Location: [facility] OR;  Service: Open Heart Surgery;  Laterality: N/A;   TUBAL LIGATION      Current Outpatient Medications  Medication Sig Dispense Refill   acetaminophen (TYLENOL) 500 MG tablet Take 2 tablets by mouth 3 times daily as needed for pain 100 tablet 0   aspirin EC 81 MG tablet Take 1 tablet (81 mg total) by mouth daily. 90 tablet 3   buPROPion (WELLBUTRIN XL) 300 MG 24 hr tablet Take 1 tablet (300 mg total) by mouth daily. 90 tablet 2   cholecalciferol 25 MCG (1000 UT) tablet Take 1 tablet (1,000 Units total) by mouth in the morning and at bedtime.     clopidogrel (PLAVIX) 75 MG tablet TAKE 1 TABLET BY MOUTH DAILY 90 tablet 2   cyanocobalamin (VITAMIN B12) 1000 MCG tablet Take 1 tablet (1,000 mcg total) by mouth daily. 30 tablet 1   ferrous sulfate 325 (65 FE)  MG EC tablet Take 325 mg by mouth 2 (two) times daily.     folic acid (FOLVITE) 1 MG tablet Take 1 tablet (1 mg total) by mouth daily. 30 tablet 1   gabapentin (NEURONTIN) 100 MG capsule Take 3 capsules (300 mg total) by mouth at bedtime.     isosorbide mononitrate (IMDUR) 30 MG 24 hr tablet Take 0.5 tablets (15 mg total) by mouth daily. 45 tablet 3   levothyroxine (SYNTHROID) 50 MCG tablet Take 1 tablet (50 mcg total) by mouth See admin instructions. Take 1 tablet in the morning with breakfast on SATURDAY AND SUNDAY.     levothyroxine (SYNTHROID) 75 MCG tablet Take 1 tablet (75 mcg total) by mouth daily Monday thru Friday. 60 tablet 3   linaclotide (LINZESS) 145 MCG CAPS capsule Take 145 mcg by mouth every other day.     metolazone (ZAROXOLYN) 5 MG tablet Once a week on Wednesdays if you gain weight or swelling appreciated. 12 tablet 1   midodrine (PROAMATINE) 5  MG tablet Take 1 tablet (5 mg total) by mouth 3 (three) times daily with meals. 270 tablet 3   nitroGLYCERIN (NITROSTAT) 0.4 MG SL tablet Place 1 tablet (0.4 mg total) under the tongue every 5 (five) minutes as needed for chest pain. 25 tablet 7   ondansetron (ZOFRAN) 4 MG tablet take 1 tablet (4 mg) by mouth 2 times per day as needed for nausea 180 tablet 1   pantoprazole (PROTONIX) 40 MG tablet Take 1 tablet (40 mg total) by mouth 2 (two) times daily. 60 tablet 1   Polyvinyl Alcohol-Povidone (REFRESH OP) Apply 1 drop to eye 4 (four) times daily.     Potassium Chloride ER 20 MEQ TBCR Take 20 mEq by mouth 2 (two) times daily.     promethazine (PHENERGAN) 25 MG tablet Take 1 tablet (25 mg total) by mouth every 6 (six) hours as needed for nausea or vomiting.     ranolazine (RANEXA) 1000 MG SR tablet Take 1 tablet (1,000 mg total) by mouth 2 (two) times daily. 180 tablet 3   rOPINIRole (REQUIP) 4 MG tablet Take 1 tablet (4 mg total) by mouth 1 to 3 hours before bedtime. 90 tablet 0   rosuvastatin (CRESTOR) 40 MG tablet Take 1 tablet (40 mg total) by mouth at bedtime.     topiramate (TOPAMAX) 100 MG tablet TAKE 1 TABLET BY MOUTH TWICE DAILY. 180 tablet 3   torsemide 60 MG TABS Take 60 mg by mouth 2 (two) times daily. 60 tablet 2   traMADol (ULTRAM) 50 MG tablet Take 1 tablet (50 mg total) by mouth daily as needed for moderate pain.     traZODone (DESYREL) 100 MG tablet Take 150 mg by mouth at bedtime.     dapagliflozin propanediol (FARXIGA) 10 MG TABS tablet Take 1 tablet (10 mg total) by mouth daily before breakfast. Hold medication until follow-up with cardiology service. (Patient not taking: Reported on 03/30/2023)     metoprolol succinate (TOPROL-XL) 25 MG 24 hr tablet Take 0.5 tablets (12.5 mg total) by mouth at bedtime. Take with or immediately following a meal. (Patient not taking: Reported on 04/30/2023) 90 tablet 3   No current facility-administered medications for this visit.    Facility-Administered Medications Ordered in Other Visits  Medication Dose Route Frequency Provider Last Rate Last Admin   epoetin alfa-epbx (RETACRIT) injection 4,000 Units  4,000 Units Subcutaneous Once Bhutani, Manpreet S, MD       Allergies:  Abilify [aripiprazole], Ciprofloxacin,  Carbidopa-levodopa, Prednisone, and Venlafaxine   Social History: The patient  reports that she quit smoking about 3 years ago. Her smoking use included cigarettes. She started smoking about 50 years ago. She has a 47 pack-year smoking history. She has been exposed to tobacco smoke. She has never used smokeless tobacco. She reports that she does not drink alcohol and does not use drugs.   Family History: The patient's family history includes Asthma in her mother; COPD in her mother; Congestive Heart Failure in her father; Diabetes in her mother; Macular degeneration in her mother.   ROS:  Please see the history of present illness. Otherwise, complete review of systems is positive for none.  All other systems are reviewed and negative.   Physical Exam: VS:  BP (!) 108/58   Pulse 82   Ht 5\' 3"  (1.6 m)   Wt 157 lb (71.2 kg)   SpO2 99%   BMI 27.81 kg/m , BMI Body mass index is 27.81 kg/m.  Wt Readings from Last 3 Encounters:  04/30/23 157 lb (71.2 kg)  03/30/23 148 lb 6.4 oz (67.3 kg)  03/20/23 140 lb 14 oz (63.9 kg)    General: Patient appears comfortable at rest. HEENT: Conjunctiva and lids normal, oropharynx clear with moist mucosa. Neck: Supple, no elevated JVP or carotid bruits, no thyromegaly. Lungs: Clear to auscultation, nonlabored breathing at rest. Cardiac: Regular rate and rhythm, no S3 or significant systolic murmur, no pericardial rub. Abdomen: Soft, nontender, no hepatomegaly, bowel sounds present, no guarding or rebound. Extremities: 3) pitting edema, distal pulses 2+. Skin: Warm and dry. Musculoskeletal: No kyphosis. Neuropsychiatric: Alert and oriented x3, affect grossly  appropriate.  Recent Labwork: 01/25/2023: ALT 11; AST 14; Magnesium 2.3 03/17/2023: Platelets 243; TSH 6.955 03/30/2023: B Natriuretic Peptide 67.9; BUN 40; Creatinine, Ser 2.35; Potassium 4.7; Sodium 136 04/19/2023: Hemoglobin 9.5     Component Value Date/Time   CHOL 115 01/27/2023 0336   CHOL 147 03/28/2021 0831   TRIG 193 (H) 01/27/2023 0336   HDL 56 01/27/2023 0336   HDL 71 03/28/2021 0831   CHOLHDL 2.1 01/27/2023 0336   VLDL 39 01/27/2023 0336   LDLCALC 20 01/27/2023 0336   LDLCALC 51 03/28/2021 0831    Other Studies Reviewed Today: Echocardiogram on 12/22/2022 LVEF 60 to 65% G2 DD RV systolic function is normal Normal PASP LA mildly dilated Mild MR No aortic valve lesions  Event monitor in 09/2022 Unremarkable  LHC in 12/2020 LIMA to LAD is patent. Bypass graft to circumflex is patent.  Distribution of native vessel after the insertion is very small.  The native vessel diameter is minuscule. Bypass graft to PDA is patent.  PDA is totally occluded at its ostium and is jailed by stent. Left main is widely patent. LAD is severely and diffusely diseased with 70 to 80% stenosis from proximal to mid.  Competitive flow is noted in the mid LAD due to LIMA graft insertion. Circumflex is totally occluded in the mid vessel.  Very faint collaterals are noted to small "threadlike vessels". Right coronary is widely patent and has a heavy burden of stents that extends from the mid vessel into the continuation beyond the origin of the PDA.  The PDA is jailed.  No significant obstruction is noted in the right coronary other than the PDA which is supplied by a graft. Hyperdynamic left ventricle.  EF greater than 70%.  LVEDP is normal.  Assessment and Plan: Patient is a 62 year old F known to have CAD s/p 3v CABG (  LIMA to LAD, SVG to OM and SVG to PDA) with normal LVEF, HTN, DM 2, HLD, CKD stage IIIa is here for follow-up visit.  # Chronic diastolic heart failure, compensated -Patient had  multiple hospitalizations for diastolic heart failure exacerbation in the last 6 months. She was seen by advanced heart failure recently in 7/24 and heart failure monitoring system (HFMS) placed for close monitoring of volume status. Currently on p.o. torsemide 60 mg twice daily (dose increased from 40 mg to 60 mg in the last clinic visit) and p.o. metolazone 5 mg once weekly.  Serum creatinine significantly improved from 3.36 (around 1 month ago) to 2.35 in 07/24. -Follow-up with advanced heart failure (there was discussion to switch HFMS to CardioMeMs)   # CAD s/p 3v CABG (LIMA to LAD, SVG to OM and SVG to PDA) with normal LVEF, CCS II-III angina -Continues to have CCS class II-III angina -Due to multiple lightheadedness episodes in the past, metoprolol dose was decreased from 25 mg to 12.5 mg once daily which the patient self discontinued due to soft blood pressures. Currently on urinalysis and 1000 mg twice daily. Unable to add Imdur due to soft pressures however will add midodrine 5 mg three times daily to increase BP and start Imdur 15 mg once daily for antianginal therapy. -Continue DAPT indefinitely, aspirin 81 mg and Plavix 75 mg once daily and continue rosuvastatin 40 mg nightly.   # HLD, at goal -Continue rosuvastatin 40 mg nightly, goal LDL less than 55.    I have spent a total of 30 minutes with patient reviewing chart, EKGs, labs and examining patient as well as establishing an assessment and plan that was discussed with the patient.  > 50% of time was spent in direct patient care.    Medication Adjustments/Labs and Tests Ordered: Current medicines are reviewed at length with the patient today.  Concerns regarding medicines are outlined above.   Tests Ordered: No orders of the defined types were placed in this encounter.   Medication Changes: Meds ordered this encounter  Medications   midodrine (PROAMATINE) 5 MG tablet    Sig: Take 1 tablet (5 mg total) by mouth 3 (three)  times daily with meals.    Dispense:  270 tablet    Refill:  3   isosorbide mononitrate (IMDUR) 30 MG 24 hr tablet    Sig: Take 0.5 tablets (15 mg total) by mouth daily.    Dispense:  45 tablet    Refill:  3    Disposition:  Follow up  6 months  Signed,  Verne Spurr, MD, 05/03/2023 12:17 PM    [facility] [address] [place], [place] [address]

## 2023-05-03 NOTE — Progress Notes (Addendum)
Hgb. 10.9 no injection needed.

## 2023-05-04 DIAGNOSIS — H35363 Drusen (degenerative) of macula, bilateral: Secondary | ICD-10-CM | POA: Diagnosis not present

## 2023-05-04 DIAGNOSIS — H43813 Vitreous degeneration, bilateral: Secondary | ICD-10-CM | POA: Diagnosis not present

## 2023-05-04 DIAGNOSIS — H35033 Hypertensive retinopathy, bilateral: Secondary | ICD-10-CM | POA: Diagnosis not present

## 2023-05-04 DIAGNOSIS — H43823 Vitreomacular adhesion, bilateral: Secondary | ICD-10-CM | POA: Diagnosis not present

## 2023-05-05 NOTE — Progress Notes (Signed)
ADVANCED HEART FAILURE CLINIC NOTE  Referring Physician: Benita Stabile, MD  Primary Care: Benita Stabile, MD Primary Cardiologist: Luane School, MD  HPI: Meghan Terry is a 62 y.o. female with CAD status post CABG in 2020 with LIMA to LAD, SVG to OM and SVG to PDA, history of PCI x 3 to the RCA, hypertension, hyperlipidemia, type 2 diabetes, stage IV CKD, chronic anemia and heart failure with preserved ejection fraction presenting today for follow up.  She was recently admitted to the hospital after presenting to outpatient clinic visit with decompensated HFpEF.  She was diuresed 4.5 L with IV Lasix and discharged home. During our last appointment, we increased diuretics, however, she once more had another episode of decompensated HFpEF leading to admission in 6/24; now over 5 hospitalizations for HFpEF in the past 1 year.   Interval hx:  - Since our last visit she has been wearing the [employer] HFMS. She has now had significant improvement in LE edema, SOB & energy levels; traveling to [place] tomorrow to see her niece.  - No PND, orthopnea   Past Medical History:  Diagnosis Date   Acute diastolic CHF (congestive heart failure) (HCC) 06/08/2021   Acute pulmonary edema (HCC)    Anemia due to stage 4 chronic kidney disease (HCC) 07/20/2022   Angina pectoris (HCC) 11/17/2019   Anxiety    Atypical chest pain 12/30/2020   Benign hypertension with CKD (chronic kidney disease) stage IV (HCC) 07/20/2022   CHF (congestive heart failure) (HCC) 06/08/2021   CKD (chronic kidney disease) stage 3b, GFR 30-59 ml/min 12/03/2019   Coronary artery disease    Coronary artery disease involving native coronary artery of native heart with angina pectoris (HCC) 10/03/2019   Depression 04/25/2017   Diabetes mellitus due to underlying condition with unspecified complications (HCC) 09/05/2019   Diabetes mellitus with stage 4 chronic kidney disease GFR 15-29 (HCC) 07/20/2022   Essential hypertension 09/05/2019    Ex-smoker 09/05/2019   Family history of coronary artery disease 09/05/2019   History of depression 04/15/2020   Hx of diabetes mellitus 04/15/2020   Hyperlipidemia with target LDL less than 70 11/24/2019   Hyperphosphatemia 07/20/2022   Hypertension    Hypokalemia 04/30/2022   Hypothyroid    Migraine 04/25/2017   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   Syncope and collapse 04/29/2022   Thyroid disease    UTI (urinary tract infection) 04/30/2022    Current Outpatient Medications  Medication Sig Dispense Refill   acetaminophen (TYLENOL) 500 MG tablet Take 2 tablets by mouth 3 times daily as needed for pain 100 tablet 0   aspirin EC 81 MG tablet Take 1 tablet (81 mg total) by mouth daily. 90 tablet 3   buPROPion (WELLBUTRIN XL) 300 MG 24 hr tablet Take 1 tablet (300 mg total) by mouth daily. 90 tablet 2   cholecalciferol 25 MCG (1000 UT) tablet Take 1 tablet (1,000 Units total) by mouth in the morning and at bedtime.     clopidogrel (PLAVIX) 75 MG tablet TAKE 1 TABLET BY MOUTH DAILY 90 tablet 2   cyanocobalamin (VITAMIN B12) 1000 MCG tablet Take 1 tablet (1,000 mcg total)  by mouth daily. 30 tablet 1   dapagliflozin propanediol (FARXIGA) 10 MG TABS tablet Take 1 tablet (10 mg total) by mouth daily before breakfast. Hold medication until follow-up with cardiology service.     ferrous sulfate 325 (65 FE) MG EC tablet Take 325 mg by mouth 2 (two) times daily.     folic acid (FOLVITE) 1 MG tablet Take 1 tablet (1 mg total) by mouth daily. 30 tablet 1   gabapentin (NEURONTIN) 100 MG capsule Take 3 capsules (300 mg total) by mouth at bedtime.     isosorbide mononitrate (IMDUR) 30 MG  24 hr tablet Take 0.5 tablets (15 mg total) by mouth daily. 45 tablet 3   levothyroxine (SYNTHROID) 50 MCG tablet Take 1 tablet (50 mcg total) by mouth See admin instructions. Take 1 tablet in the morning with breakfast on SATURDAY AND SUNDAY.     levothyroxine (SYNTHROID) 75 MCG tablet Take 1 tablet (75 mcg total) by mouth daily Monday thru Friday. 60 tablet 3   linaclotide (LINZESS) 145 MCG CAPS capsule Take 145 mcg by mouth every other day.     metolazone (ZAROXOLYN) 5 MG tablet Once a week on Wednesdays if you gain weight or swelling appreciated. 12 tablet 1   metoprolol succinate (TOPROL-XL) 25 MG 24 hr tablet Take 0.5 tablets (12.5 mg total) by mouth at bedtime. Take with or immediately following a meal. 90 tablet 3   midodrine (PROAMATINE) 5 MG tablet Take 1 tablet (5 mg total) by mouth 3 (three) times daily with meals. 270 tablet 3   nitroGLYCERIN (NITROSTAT) 0.4 MG SL tablet Place 1 tablet (0.4 mg total) under the tongue every 5 (five) minutes as needed for chest pain. 25 tablet 7   ondansetron (ZOFRAN) 4 MG tablet take 1 tablet (4 mg) by mouth 2 times per day as needed for nausea 180 tablet 1   pantoprazole (PROTONIX) 40 MG tablet Take 1 tablet (40 mg total) by mouth 2 (two) times daily. 60 tablet 1   Polyvinyl Alcohol-Povidone (REFRESH OP) Apply 1 drop to eye 4 (four) times daily.     Potassium Chloride ER 20 MEQ TBCR Take 20 mEq by mouth 2 (two) times daily.     promethazine (PHENERGAN) 25 MG tablet Take 1 tablet (25 mg total) by mouth every 6 (six) hours as needed for nausea or vomiting.     ranolazine (RANEXA) 1000 MG SR tablet Take 1 tablet (1,000 mg total) by mouth 2 (two) times daily. 180 tablet 3   rOPINIRole (REQUIP) 4 MG tablet Take 1 tablet (4 mg total) by mouth 1 to 3 hours before bedtime. 90 tablet 0   rosuvastatin (CRESTOR) 40 MG tablet Take 1 tablet (40 mg total) by mouth at bedtime.     topiramate (TOPAMAX) 100 MG tablet TAKE 1 TABLET BY MOUTH TWICE DAILY. 180 tablet 3    torsemide 60 MG TABS Take 60 mg by mouth 2 (two) times daily. 60 tablet 2   traMADol (ULTRAM) 50 MG tablet Take 1 tablet (50 mg total) by mouth daily as needed for moderate pain.     traZODone (DESYREL) 100 MG tablet Take 150 mg by mouth at bedtime.     No current facility-administered medications for this encounter.    Allergies  Allergen Reactions   Abilify [Aripiprazole]    Ciprofloxacin Nausea And Vomiting   Carbidopa-Levodopa Other (See Comments)    Developed tics while taking    Prednisone Other (See Comments)    Turns red all over  Venlafaxine Other (See Comments)    Developed tics while taking - reaction to Effexor       Social History   Socioeconomic History   Marital status: Married    Spouse name: Kieren Rohena   Number of children: 2   Years of education: Not on file   Highest education level: Associate degree: occupational, Scientist, product/process development, or vocational program  Occupational History   Occupation: disabilty    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  Tobacco Use   Smoking status: Former    Current packs/day: 0.00    Average packs/day: 1 pack/day for 47.0 years (47.0 ttl pk-yrs)    Types: Cigarettes    Start date: 09/14/1972    Quit date: 09/15/2019    Years since quitting: 3.6    Passive exposure: Past   Smokeless tobacco: Never  Vaping Use   Vaping status: Every Day   Start date: 09/15/2019   Substances: Nicotine  Substance and Sexual Activity   Alcohol use: No   Drug use: No   Sexual activity: Not on file  Other Topics Concern   Not on file  Social History Narrative   Not on file   Social Determinants of Health   Financial Resource Strain: High Risk (06/19/2021)   Overall Financial Resource Strain (CARDIA)    Difficulty of Paying Living Expenses: Very hard  Food Insecurity: Low Risk  (04/29/2023)   Received from [facility]   Food vital sign    Within the past 12 months, you worried that your food would run out before you got money to buy more:  Never true    Within the past 12 months, the food you bought just didn't last and you didn't have money to get more. : Never true  Transportation Needs: Not on file (04/29/2023)  Physical Activity: Not on file  Stress: Not on file  Social Connections: Not on file  Intimate Partner Violence: Not At Risk (03/17/2023)   Humiliation, Afraid, Rape, and Kick questionnaire    Fear of Current or Ex-Partner: No    Emotionally Abused: No    Physically Abused: No    Sexually Abused: No      Family History  Problem Relation Age of Onset   Asthma Mother    COPD Mother    Diabetes Mother    Macular degeneration Mother    Congestive Heart Failure Father     PHYSICAL EXAM: Vitals:   05/06/23 0927  BP: (!) 140/68  Pulse: 90  SpO2: 95%   GENERAL: Well nourished, well developed, and in no apparent distress at rest.  HEENT: Negative for arcus senilis or xanthelasma. There is no scleral icterus.  The mucous membranes are pink and moist.   NECK: Supple, No masses. Normal carotid upstrokes without bruits. No masses or thyromegaly.    CHEST: There are no chest wall deformities. There is no chest wall tenderness. Respirations are unlabored.  Lungs- CTA B/L CARDIAC:  JVP: 7 cm          Normal rate with regular rhythm. No murmurs, rubs or gallops.  Pulses are 2+ and symmetrical in upper and lower extremities. No edema.  ABDOMEN: Soft, non-tender, non-distended. There are no masses or hepatomegaly. There are normal bowel sounds.  EXTREMITIES: Warm and well perfused with no cyanosis, clubbing.  LYMPHATIC: No axillary or supraclavicular lymphadenopathy.  NEUROLOGIC: Patient is oriented x3 with no focal or lateralizing neurologic deficits.  PSYCH: Patients affect is appropriate, there is no evidence of anxiety or depression.  SKIN: Warm and dry; no lesions or wounds.     DATA REVIEW  ECG: 02/18/23: NSR  As per my personal interpretation  ECHO: 12/22/22: LVEF 60-65%, grade II DD; normal RV function.    CATH: 01/18/23: Overall, no significant change when compared to angiography performed in June 2021. LIMA to LAD is patent. Bypass graft to circumflex is patent.  Distribution of native vessel after the insertion is very small.  The native vessel diameter is minuscule. Bypass graft to PDA is patent.  PDA is totally occluded at its ostium and is jailed by stent. Left main is widely patent. LAD is severely and diffusely diseased with 70 to 80% stenosis from proximal to mid.  Competitive flow is noted in the mid LAD due to LIMA graft insertion. Circumflex is totally occluded in the mid vessel.  Very faint collaterals are noted to small "threadlike vessels". Right coronary is widely patent and has a heavy burden of stents that extends from the mid vessel into the continuation beyond the origin of the PDA.  The PDA is jailed.  No significant obstruction is noted in the right coronary other than the PDA which is supplied by a graft. Hyperdynamic left ventricle.  EF greater than 70%.  LVEDP is normal.   ASSESSMENT & PLAN:  Heart failure with preserved EF - Recurrent hospital admissions for HFpEF - Will hold off on spironolactone due to fluctuating sCr over the past several lab checks and CKD IV; can start in the future.  - Strongly considering use of cardiomems due to recurrent admissions.  - Re-admitted in June 2024 with decompensated HFpEF despite compliance with diuretics. She has now had frequent hospital admissions.  - During our prior visit, we started the [facility] and she has had improvement in sCr from 3.5 to 2.3 in addition to significant improvement in functional status. Now euvolemic with no LE edema.  - Due to improvement in renal function with temporary remote monitoring will proceed with cardiomem at request of patient. Prior to remote monitoring she had 5 hospital admissions with rapid rise in sCr. Now her renal function is improving and her functional class / volume has improved  significantly.  - Continue torsemide 60mg  BID - Repeat BMP/BNP today.  - Continue farxiga 10mg  daily.   2. CAD s/p CABG - 3V CABG w/ LIMA to LAD, SVG-OM, SVG-PDA - Ranexa 1000mg  BID and metoprolol succinate 12.5mg  qhs.  - Continue follow up with general cardiology   3. Hyperlipidemia - rosuvastatin 40mg  nightly     Advanced Heart Failure Mechanical Circulatory Support

## 2023-05-06 ENCOUNTER — Ambulatory Visit (HOSPITAL_COMMUNITY)
Admission: RE | Admit: 2023-05-06 | Discharge: 2023-05-06 | Disposition: A | Discharge status: Home / Self Care | Payer: Medicare Other | Source: Ambulatory Visit | Attending: Cardiology | Admitting: Cardiology

## 2023-05-06 ENCOUNTER — Encounter (HOSPITAL_COMMUNITY): Payer: Self-pay | Admitting: Cardiology

## 2023-05-06 ENCOUNTER — Telehealth (HOSPITAL_COMMUNITY): Payer: Self-pay

## 2023-05-06 VITALS — BP 140/68 | HR 90 | Wt 156.4 lb

## 2023-05-06 DIAGNOSIS — E1122 Type 2 diabetes mellitus with diabetic chronic kidney disease: Secondary | ICD-10-CM | POA: Insufficient documentation

## 2023-05-06 DIAGNOSIS — I130 Hypertensive heart and chronic kidney disease with heart failure and stage 1 through stage 4 chronic kidney disease, or unspecified chronic kidney disease: Secondary | ICD-10-CM | POA: Insufficient documentation

## 2023-05-06 DIAGNOSIS — I5032 Chronic diastolic (congestive) heart failure: Secondary | ICD-10-CM

## 2023-05-06 DIAGNOSIS — Z951 Presence of aortocoronary bypass graft: Secondary | ICD-10-CM | POA: Insufficient documentation

## 2023-05-06 DIAGNOSIS — I129 Hypertensive chronic kidney disease with stage 1 through stage 4 chronic kidney disease, or unspecified chronic kidney disease: Secondary | ICD-10-CM

## 2023-05-06 DIAGNOSIS — E785 Hyperlipidemia, unspecified: Secondary | ICD-10-CM | POA: Insufficient documentation

## 2023-05-06 DIAGNOSIS — I5033 Acute on chronic diastolic (congestive) heart failure: Secondary | ICD-10-CM | POA: Insufficient documentation

## 2023-05-06 DIAGNOSIS — Z79899 Other long term (current) drug therapy: Secondary | ICD-10-CM | POA: Diagnosis not present

## 2023-05-06 DIAGNOSIS — I2510 Atherosclerotic heart disease of native coronary artery without angina pectoris: Secondary | ICD-10-CM | POA: Insufficient documentation

## 2023-05-06 DIAGNOSIS — N184 Chronic kidney disease, stage 4 (severe): Secondary | ICD-10-CM | POA: Diagnosis not present

## 2023-05-06 LAB — BASIC METABOLIC PANEL WITH GFR
Anion gap: 10 (ref 5–15)
BUN: 47 mg/dL — ABNORMAL HIGH (ref 8–23)
CO2: 22 mmol/L (ref 22–32)
Calcium: 9.1 mg/dL (ref 8.9–10.3)
Chloride: 104 mmol/L (ref 98–111)
Creatinine, Ser: 2.21 mg/dL — ABNORMAL HIGH (ref 0.44–1.00)
GFR, Estimated: 24 mL/min — ABNORMAL LOW
Glucose, Bld: 176 mg/dL — ABNORMAL HIGH (ref 70–99)
Potassium: 4.3 mmol/L (ref 3.5–5.1)
Sodium: 136 mmol/L (ref 135–145)

## 2023-05-06 LAB — BRAIN NATRIURETIC PEPTIDE: B Natriuretic Peptide: 16.4 pg/mL (ref 0.0–100.0)

## 2023-05-06 LAB — CBC
HCT: 34.5 % — ABNORMAL LOW (ref 36.0–46.0)
Hemoglobin: 10.8 g/dL — ABNORMAL LOW (ref 12.0–15.0)
MCH: 29.9 pg (ref 26.0–34.0)
MCHC: 31.3 g/dL (ref 30.0–36.0)
MCV: 95.6 fL (ref 80.0–100.0)
Platelets: 222 10*3/uL (ref 150–400)
RBC: 3.61 MIL/uL — ABNORMAL LOW (ref 3.87–5.11)
RDW: 13.5 % (ref 11.5–15.5)
WBC: 5.8 10*3/uL (ref 4.0–10.5)
nRBC: 0 % (ref 0.0–0.2)

## 2023-05-06 NOTE — Telephone Encounter (Signed)
Received a fax requesting medical records from [facility]. Records were successfully faxed to: [phone] ,which was the number provided.. Medical request form will be scanned into patients chart per Continuation of care.

## 2023-05-06 NOTE — Patient Instructions (Signed)
Medication Changes:  None, continue current medications  Lab Work:  Labs done today, your results will be available in MyChart, we will contact you for abnormal readings.  Testing/Procedures:  You have been referred for a Cardiomems Device  Please review the educational material we have provided to you  We will submit the application to your insurance company, this process can take 4-8 weeks  Once it is approved we will call you to schedule the procedure   Once it is implanted you can expect:  A weekly call from our office about your readings for the first 3 weeks after implant After that we will just call you periodically   A follow up appointment with our office about 3 weeks after implant  A monthly bill from our office   **If you have questions about this process or the device please Meredith Staggers, RN or Magda Bernheim, CMA at [phone]  Referrals:  none  Special Instructions // Education:  Do the following things EVERYDAY: Weigh yourself in the morning before breakfast. Write it down and keep it in a log. Take your medicines as prescribed Eat low salt foods--Limit salt (sodium) to 2000 mg per day.  Stay as active as you can everyday Limit all fluids for the day to less than 2 liters  Follow-Up in: 2-3 months  At the [facility], you and your health needs are our priority. We have a designated team specialized in the treatment of Heart Failure. This Care Team includes your primary Heart Failure Specialized Cardiologist (physician), Advanced Practice Providers (APPs- Physician Assistants and Nurse Practitioners), and Pharmacist who all work together to provide you with the care you need, when you need it.   You may see any of the following providers on your designated Care Team at your next follow up:  Dr. Arvilla Meres Dr. Marca Ancona Dr. Marcos Eke, NP Robbie Lis, [place] [facility] [place], [place] Brynda Peon, NP Karle Plumber, PharmD   Please be sure to bring in all your medications bottles to every appointment.   Need to Contact us:  If you have any questions or concerns before your next appointment please send [place] a message through [place] or call our office at [phone].    TO LEAVE A MESSAGE FOR THE NURSE SELECT OPTION 2, PLEASE LEAVE A MESSAGE INCLUDING: YOUR NAME DATE OF BIRTH CALL BACK NUMBER REASON FOR CALL**this is important as we prioritize the call backs  YOU WILL RECEIVE A CALL BACK THE SAME DAY AS LONG AS YOU CALL BEFORE 4:00 PM

## 2023-05-10 ENCOUNTER — Ambulatory Visit (INDEPENDENT_AMBULATORY_CARE_PROVIDER_SITE_OTHER): Payer: Medicare Other | Admitting: Gastroenterology

## 2023-05-17 ENCOUNTER — Ambulatory Visit: Payer: Medicare Other

## 2023-05-17 ENCOUNTER — Ambulatory Visit (HOSPITAL_COMMUNITY): Payer: Medicare Other

## 2023-05-17 DIAGNOSIS — Z9889 Other specified postprocedural states: Secondary | ICD-10-CM | POA: Diagnosis not present

## 2023-05-17 DIAGNOSIS — H2512 Age-related nuclear cataract, left eye: Secondary | ICD-10-CM | POA: Diagnosis not present

## 2023-05-18 DIAGNOSIS — H2511 Age-related nuclear cataract, right eye: Secondary | ICD-10-CM | POA: Diagnosis not present

## 2023-05-19 ENCOUNTER — Ambulatory Visit (HOSPITAL_COMMUNITY): Payer: Medicare Other

## 2023-05-19 ENCOUNTER — Encounter (INDEPENDENT_AMBULATORY_CARE_PROVIDER_SITE_OTHER): Payer: Medicare Other | Admitting: *Deleted

## 2023-05-19 VITALS — BP 132/63 | HR 96 | Temp 98.2°F | Resp 18

## 2023-05-19 DIAGNOSIS — N184 Chronic kidney disease, stage 4 (severe): Secondary | ICD-10-CM | POA: Diagnosis not present

## 2023-05-19 DIAGNOSIS — D631 Anemia in chronic kidney disease: Secondary | ICD-10-CM

## 2023-05-19 MED ORDER — EPOETIN ALFA-EPBX 4000 UNIT/ML IJ SOLN: 4000.0000 [IU] | Freq: Once | INTRAMUSCULAR | Status: DC

## 2023-05-19 NOTE — Progress Notes (Signed)
Diagnosis: Anemia in Chronic Kidney Disease  Provider:  Celso Amy MD  Procedure: Injection  Retacrit (epoetin alfa-epbx), Dose: 4000 Units, Site: subcutaneous, Number of injections: 0  Hemoglobin 10.7  Post Care: Patient declined observation  Discharge: Condition: Good, Destination: Home . AVS Provided  Performed by:  Daleen Squibb, RN

## 2023-05-24 ENCOUNTER — Ambulatory Visit (INDEPENDENT_AMBULATORY_CARE_PROVIDER_SITE_OTHER): Payer: Medicare Other | Admitting: Neurology

## 2023-05-24 ENCOUNTER — Encounter: Payer: Self-pay | Admitting: Neurology

## 2023-05-24 ENCOUNTER — Telehealth: Payer: Self-pay | Admitting: Neurology

## 2023-05-24 VITALS — BP 132/76 | Ht 63.0 in | Wt 161.0 lb

## 2023-05-24 DIAGNOSIS — Z87828 Personal history of other (healed) physical injury and trauma: Secondary | ICD-10-CM

## 2023-05-24 DIAGNOSIS — R4189 Other symptoms and signs involving cognitive functions and awareness: Secondary | ICD-10-CM | POA: Diagnosis not present

## 2023-05-24 DIAGNOSIS — G2581 Restless legs syndrome: Secondary | ICD-10-CM
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
## 2023-05-24 NOTE — Telephone Encounter (Signed)
[employer] medicare NPR sent to GI [phone] 

## 2023-05-26 DIAGNOSIS — N184 Chronic kidney disease, stage 4 (severe): Secondary | ICD-10-CM | POA: Diagnosis not present

## 2023-05-26 DIAGNOSIS — D638 Anemia in other chronic diseases classified elsewhere: Secondary | ICD-10-CM | POA: Diagnosis not present

## 2023-05-26 DIAGNOSIS — I5032 Chronic diastolic (congestive) heart failure: Secondary | ICD-10-CM | POA: Diagnosis not present

## 2023-05-26 DIAGNOSIS — E1122 Type 2 diabetes mellitus with diabetic chronic kidney disease: Secondary | ICD-10-CM | POA: Diagnosis not present

## 2023-05-26 DIAGNOSIS — N189 Chronic kidney disease, unspecified: Secondary | ICD-10-CM | POA: Diagnosis not present

## 2023-05-27 ENCOUNTER — Other Ambulatory Visit: Payer: Self-pay

## 2023-05-27 ENCOUNTER — Inpatient Hospital Stay (HOSPITAL_COMMUNITY)
Admission: EM | Admit: 2023-05-27 | Discharge: 2023-05-29 | DRG: 291 | Disposition: A | Discharge status: Home / Self Care | Payer: Medicare Other | Attending: Internal Medicine | Admitting: Internal Medicine

## 2023-05-27 ENCOUNTER — Encounter: Payer: Medicare Other | Admitting: Internal Medicine

## 2023-05-27 ENCOUNTER — Encounter: Payer: Self-pay | Admitting: Internal Medicine

## 2023-05-27 ENCOUNTER — Encounter (HOSPITAL_COMMUNITY): Payer: Self-pay

## 2023-05-27 ENCOUNTER — Emergency Department (HOSPITAL_COMMUNITY): Payer: Medicare Other

## 2023-05-27 ENCOUNTER — Encounter (HOSPITAL_COMMUNITY)
Admission: EM | Disposition: A | Discharge status: Home / Self Care | Payer: Self-pay | Source: Home / Self Care | Attending: Internal Medicine

## 2023-05-27 VITALS — BP 180/120 | HR 101 | Ht 63.0 in | Wt 168.8 lb

## 2023-05-27 DIAGNOSIS — R079 Chest pain, unspecified: Secondary | ICD-10-CM | POA: Diagnosis not present

## 2023-05-27 DIAGNOSIS — R0602 Shortness of breath: Secondary | ICD-10-CM | POA: Diagnosis present

## 2023-05-27 DIAGNOSIS — Z87891 Personal history of nicotine dependence: Secondary | ICD-10-CM | POA: Diagnosis not present

## 2023-05-27 DIAGNOSIS — I130 Hypertensive heart and chronic kidney disease with heart failure and stage 1 through stage 4 chronic kidney disease, or unspecified chronic kidney disease: Principal | ICD-10-CM | POA: Diagnosis present

## 2023-05-27 DIAGNOSIS — I5033 Acute on chronic diastolic (congestive) heart failure: Secondary | ICD-10-CM | POA: Diagnosis not present

## 2023-05-27 DIAGNOSIS — E785 Hyperlipidemia, unspecified: Secondary | ICD-10-CM | POA: Diagnosis present

## 2023-05-27 DIAGNOSIS — Z7984 Long term (current) use of oral hypoglycemic drugs: Secondary | ICD-10-CM | POA: Diagnosis not present

## 2023-05-27 DIAGNOSIS — E039 Hypothyroidism, unspecified: Secondary | ICD-10-CM | POA: Diagnosis not present

## 2023-05-27 DIAGNOSIS — I309 Acute pericarditis, unspecified: Secondary | ICD-10-CM | POA: Diagnosis not present

## 2023-05-27 DIAGNOSIS — Z8249 Family history of ischemic heart disease and other diseases of the circulatory system: Secondary | ICD-10-CM | POA: Diagnosis not present

## 2023-05-27 DIAGNOSIS — Z9049 Acquired absence of other specified parts of digestive tract: Secondary | ICD-10-CM | POA: Diagnosis not present

## 2023-05-27 DIAGNOSIS — R0689 Other abnormalities of breathing: Secondary | ICD-10-CM | POA: Diagnosis not present

## 2023-05-27 DIAGNOSIS — Z951 Presence of aortocoronary bypass graft: Secondary | ICD-10-CM

## 2023-05-27 DIAGNOSIS — R918 Other nonspecific abnormal finding of lung field: Secondary | ICD-10-CM | POA: Diagnosis not present

## 2023-05-27 DIAGNOSIS — D631 Anemia in chronic kidney disease: Secondary | ICD-10-CM | POA: Diagnosis present

## 2023-05-27 DIAGNOSIS — I509 Heart failure, unspecified: Secondary | ICD-10-CM

## 2023-05-27 DIAGNOSIS — Z79899 Other long term (current) drug therapy: Secondary | ICD-10-CM

## 2023-05-27 DIAGNOSIS — Z743 Need for continuous supervision: Secondary | ICD-10-CM | POA: Diagnosis not present

## 2023-05-27 DIAGNOSIS — Z881 Allergy status to other antibiotic agents status: Secondary | ICD-10-CM

## 2023-05-27 DIAGNOSIS — E1122 Type 2 diabetes mellitus with diabetic chronic kidney disease: Secondary | ICD-10-CM | POA: Diagnosis not present

## 2023-05-27 DIAGNOSIS — Z825 Family history of asthma and other chronic lower respiratory diseases: Secondary | ICD-10-CM

## 2023-05-27 DIAGNOSIS — Z888 Allergy status to other drugs, medicaments and biological substances status: Secondary | ICD-10-CM | POA: Diagnosis not present

## 2023-05-27 DIAGNOSIS — J9811 Atelectasis: Secondary | ICD-10-CM | POA: Diagnosis present

## 2023-05-27 DIAGNOSIS — I5031 Acute diastolic (congestive) heart failure: Secondary | ICD-10-CM | POA: Diagnosis not present

## 2023-05-27 DIAGNOSIS — G2581 Restless legs syndrome: Secondary | ICD-10-CM | POA: Diagnosis present

## 2023-05-27 DIAGNOSIS — I2510 Atherosclerotic heart disease of native coronary artery without angina pectoris: Secondary | ICD-10-CM | POA: Diagnosis not present

## 2023-05-27 DIAGNOSIS — I499 Cardiac arrhythmia, unspecified: Secondary | ICD-10-CM | POA: Diagnosis not present

## 2023-05-27 DIAGNOSIS — Z955 Presence of coronary angioplasty implant and graft: Secondary | ICD-10-CM

## 2023-05-27 DIAGNOSIS — Z833 Family history of diabetes mellitus: Secondary | ICD-10-CM

## 2023-05-27 DIAGNOSIS — R072 Precordial pain: Secondary | ICD-10-CM | POA: Diagnosis not present

## 2023-05-27 DIAGNOSIS — Z79890 Hormone replacement therapy (postmenopausal): Secondary | ICD-10-CM | POA: Diagnosis not present

## 2023-05-27 DIAGNOSIS — N184 Chronic kidney disease, stage 4 (severe): Secondary | ICD-10-CM | POA: Diagnosis present

## 2023-05-27 DIAGNOSIS — Z7902 Long term (current) use of antithrombotics/antiplatelets: Secondary | ICD-10-CM

## 2023-05-27 DIAGNOSIS — Z7982 Long term (current) use of aspirin: Secondary | ICD-10-CM

## 2023-05-27 DIAGNOSIS — R0789 Other chest pain: Secondary | ICD-10-CM | POA: Diagnosis not present

## 2023-05-27 DIAGNOSIS — I700 Atherosclerosis of aorta: Secondary | ICD-10-CM | POA: Diagnosis not present

## 2023-05-27 DIAGNOSIS — R6889 Other general symptoms and signs: Secondary | ICD-10-CM | POA: Diagnosis not present

## 2023-05-27 LAB — BRAIN NATRIURETIC PEPTIDE: B Natriuretic Peptide: 88.9 pg/mL (ref 0.0–100.0)

## 2023-05-27 LAB — BASIC METABOLIC PANEL
Anion gap: 9 (ref 5–15)
BUN: 37 mg/dL — ABNORMAL HIGH (ref 8–23)
CO2: 28 mmol/L (ref 22–32)
Calcium: 9.4 mg/dL (ref 8.9–10.3)
Chloride: 99 mmol/L (ref 98–111)
Creatinine, Ser: 2.3 mg/dL — ABNORMAL HIGH (ref 0.44–1.00)
GFR, Estimated: 23 mL/min — ABNORMAL LOW (ref >60)
Glucose, Bld: 143 mg/dL — ABNORMAL HIGH (ref 70–99)
Potassium: 4.4 mmol/L (ref 3.5–5.1)
Sodium: 136 mmol/L (ref 135–145)

## 2023-05-27 LAB — CBC
HCT: 32.5 % — ABNORMAL LOW (ref 36.0–46.0)
Hemoglobin: 10.4 g/dL — ABNORMAL LOW (ref 12.0–15.0)
MCH: 30.1 pg (ref 26.0–34.0)
MCHC: 32 g/dL (ref 30.0–36.0)
MCV: 93.9 fL (ref 80.0–100.0)
Platelets: 200 10*3/uL (ref 150–400)
RBC: 3.46 MIL/uL — ABNORMAL LOW (ref 3.87–5.11)
RDW: 13.4 % (ref 11.5–15.5)
WBC: 7.7 10*3/uL (ref 4.0–10.5)
nRBC: 0 % (ref 0.0–0.2)

## 2023-05-27 LAB — TROPONIN I (HIGH SENSITIVITY)
Troponin I (High Sensitivity): 15 ng/L (ref <18)
Troponin I (High Sensitivity): 17 ng/L (ref <18)

## 2023-05-27 LAB — MRSA NEXT GEN BY PCR, NASAL: MRSA by PCR Next Gen: NOT DETECTED

## 2023-05-27 SURGERY — LEFT HEART CATH AND CORS/GRAFTS ANGIOGRAPHY
Anesthesia: LOCAL

## 2023-05-27 MED ORDER — ASPIRIN 325 MG PO TABS
325.0000 mg | ORAL_TABLET | Freq: Once | ORAL | Status: DC
Start: 2023-05-27 — End: 2023-05-29

## 2023-05-27 MED ORDER — FUROSEMIDE 10 MG/ML IJ SOLN
80.0000 mg | Freq: Once | INTRAMUSCULAR | Status: AC
  Administered 2023-05-27: 80 mg via INTRAVENOUS
  Filled 2023-05-27: qty 8

## 2023-05-27 MED ORDER — BUPROPION HCL ER (XL) 150 MG PO TB24
300.0000 mg | ORAL_TABLET | Freq: Every day | ORAL | Status: DC
  Administered 2023-05-28 – 2023-05-29 (×2): 300 mg via ORAL
  Filled 2023-05-27 (×2): qty 2

## 2023-05-27 MED ORDER — ROPINIROLE HCL 1 MG PO TABS
4.0000 mg | ORAL_TABLET | Freq: Every day | ORAL | Status: DC
  Administered 2023-05-27 – 2023-05-28 (×2): 4 mg via ORAL
  Filled 2023-05-27 (×4): qty 4

## 2023-05-27 MED ORDER — PANTOPRAZOLE SODIUM 40 MG PO TBEC
40.0000 mg | DELAYED_RELEASE_TABLET | Freq: Two times a day (BID) | ORAL | Status: DC
  Administered 2023-05-27 – 2023-05-29 (×4): 40 mg via ORAL
  Filled 2023-05-27 (×4): qty 1

## 2023-05-27 MED ORDER — SODIUM CHLORIDE 0.9 % WEIGHT BASED INFUSION
3.0000 mL/kg/h | INTRAVENOUS | Status: DC
  Administered 2023-05-27: 3 mL/kg/h via INTRAVENOUS

## 2023-05-27 MED ORDER — LEVOTHYROXINE SODIUM 50 MCG PO TABS
50.0000 ug | ORAL_TABLET | ORAL | Status: DC
  Administered 2023-05-29: 50 ug via ORAL
  Filled 2023-05-27: qty 1

## 2023-05-27 MED ORDER — TRAZODONE HCL 50 MG PO TABS: 50.0000 mg | ORAL_TABLET | Freq: Every day | ORAL | Status: DC

## 2023-05-27 MED ORDER — SODIUM CHLORIDE 0.9 % IV SOLN: 250.0000 mL | INTRAVENOUS | Status: DC | PRN

## 2023-05-27 MED ORDER — SODIUM CHLORIDE 0.9% FLUSH: 3.0000 mL | INTRAVENOUS | Status: DC | PRN

## 2023-05-27 MED ORDER — ASPIRIN 81 MG PO CHEW
81.0000 mg | CHEWABLE_TABLET | ORAL | Status: AC
  Administered 2023-05-27: 81 mg via ORAL
  Filled 2023-05-27: qty 1

## 2023-05-27 MED ORDER — ASPIRIN 325 MG PO TABS
325.0000 mg | ORAL_TABLET | Freq: Once | ORAL | Status: DC
Start: 2023-05-27 — End: 2023-05-27

## 2023-05-27 MED ORDER — RANOLAZINE ER 500 MG PO TB12
1000.0000 mg | ORAL_TABLET | Freq: Two times a day (BID) | ORAL | Status: DC
  Administered 2023-05-27 – 2023-05-29 (×4): 1000 mg via ORAL
  Filled 2023-05-27 (×4): qty 2

## 2023-05-27 MED ORDER — LEVOTHYROXINE SODIUM 75 MCG PO TABS: 75.0000 ug | ORAL_TABLET | ORAL | Status: DC

## 2023-05-27 MED ORDER — CLOPIDOGREL BISULFATE 75 MG PO TABS
75.0000 mg | ORAL_TABLET | Freq: Every day | ORAL | Status: DC
  Administered 2023-05-28 – 2023-05-29 (×2): 75 mg via ORAL
  Filled 2023-05-27 (×2): qty 1

## 2023-05-27 MED ORDER — ISOSORBIDE MONONITRATE ER 30 MG PO TB24
15.0000 mg | ORAL_TABLET | Freq: Every day | ORAL | Status: DC
  Administered 2023-05-28 – 2023-05-29 (×2): 15 mg via ORAL
  Filled 2023-05-27 (×2): qty 1

## 2023-05-27 MED ORDER — ACETAMINOPHEN 325 MG PO TABS
650.0000 mg | ORAL_TABLET | ORAL | Status: DC | PRN
  Administered 2023-05-27 – 2023-05-29 (×3): 650 mg via ORAL
  Filled 2023-05-27 (×3): qty 2

## 2023-05-27 MED ORDER — ROSUVASTATIN CALCIUM 20 MG PO TABS
40.0000 mg | ORAL_TABLET | Freq: Every day | ORAL | Status: DC
  Administered 2023-05-27 – 2023-05-28 (×2): 40 mg via ORAL
  Filled 2023-05-27 (×2): qty 2

## 2023-05-27 MED ORDER — HEPARIN SODIUM (PORCINE) 5000 UNIT/ML IJ SOLN
5000.0000 [IU] | Freq: Three times a day (TID) | INTRAMUSCULAR | Status: DC
  Administered 2023-05-27 – 2023-05-28 (×3): 5000 [IU] via SUBCUTANEOUS
  Filled 2023-05-27 (×3): qty 1

## 2023-05-27 MED ORDER — ASPIRIN 81 MG PO TBEC
81.0000 mg | DELAYED_RELEASE_TABLET | Freq: Every day | ORAL | Status: DC
  Administered 2023-05-28 – 2023-05-29 (×2): 81 mg via ORAL
  Filled 2023-05-27 (×2): qty 1

## 2023-05-27 MED ORDER — SODIUM CHLORIDE 0.9 % WEIGHT BASED INFUSION
1.0000 mL/kg/h | INTRAVENOUS | Status: DC
  Administered 2023-05-27: 1 mL/kg/h via INTRAVENOUS

## 2023-05-27 MED ORDER — CLOPIDOGREL BISULFATE 75 MG PO TABS
75.0000 mg | ORAL_TABLET | Freq: Once | ORAL | Status: DC
  Filled 2023-05-27: qty 1

## 2023-05-27 MED ORDER — ONDANSETRON HCL 4 MG/2ML IJ SOLN: 4.0000 mg | Freq: Four times a day (QID) | INTRAMUSCULAR | Status: DC | PRN

## 2023-05-27 MED ORDER — SODIUM CHLORIDE 0.9% FLUSH
3.0000 mL | Freq: Two times a day (BID) | INTRAVENOUS | Status: DC
  Administered 2023-05-27 – 2023-05-29 (×3): 3 mL via INTRAVENOUS

## 2023-05-27 MED ORDER — TRAZODONE HCL 50 MG PO TABS
200.0000 mg | ORAL_TABLET | Freq: Every day | ORAL | Status: DC
  Administered 2023-05-27 – 2023-05-28 (×2): 200 mg via ORAL
  Filled 2023-05-27 (×2): qty 4

## 2023-05-27 MED ORDER — GABAPENTIN 300 MG PO CAPS
300.0000 mg | ORAL_CAPSULE | Freq: Every day | ORAL | Status: DC
  Administered 2023-05-27 – 2023-05-28 (×2): 300 mg via ORAL
  Filled 2023-05-27 (×2): qty 1

## 2023-05-27 MED ORDER — DAPAGLIFLOZIN PROPANEDIOL 10 MG PO TABS
10.0000 mg | ORAL_TABLET | Freq: Every day | ORAL | Status: DC
  Administered 2023-05-28 – 2023-05-29 (×2): 10 mg via ORAL
  Filled 2023-05-27 (×2): qty 1

## 2023-05-27 NOTE — Progress Notes (Signed)
Cardiology Office Note  Date: 05/27/2023   ID: Meghan Terry, DOB 1961-07-15, MRN [id number]  PCP:  Benita Stabile, MD  Cardiologist:  Marjo Bicker, MD Electrophysiologist:  None    History of Present Illness: Meghan Terry is a 62 y.o. female known to have CAD s/p 3v CABG (LIMA to LAD, SVG to OM and SVG to PDA) with normal LVEF, HTN, DM 2, HLD, CKD stage IIIa is here for follow-up visit.  Patient underwent LHC in 2021 and 2022 that showed patent LIMA to LAD, patent graft to the PDA and OM. Native multivessel CAD is unchanged compared to 2021 LHC. She does have severe diffusely diseased LAD from proximal to mid after which competitive flow is noted in the mid LAD to LIMA graft insertion, CTO of LCx with very faint collaterals noted and heavy burden of stents in RCA from the mid vessel into the continuation beyond the origin of the PDA, the PDA is jailed no significant obstruction is noted in the RCA other than the PDA which was supplied by the graft. Patient is here for visit. Daughter is the power of attorney. Patient was admitted with ADHF in 12/2022 and 01/2023.  Follows up with advanced heart failure team in [place] for diastolic heart failure management.  She has HFMS in place. She is here for follow-up visit.   She reported ongoing active chest pain for the last 3 days, had to take SL NTG 8 tablets daily, was not able to sleep due to the chest pains.  After chest pain during my interview.  No DOE, orthopnea, PND.  No dizziness, syncope.  No leg swelling.  Past Medical History:  Diagnosis Date   Acute diastolic CHF (congestive heart failure) (HCC) 06/08/2021   Acute pulmonary edema (HCC)    Anemia due to stage 4 chronic kidney disease (HCC) 07/20/2022   Angina pectoris (HCC) 11/17/2019   Anxiety    Atypical chest pain 12/30/2020   Benign hypertension with CKD (chronic kidney disease) stage IV (HCC) 07/20/2022   CHF (congestive heart failure) (HCC) 06/08/2021   CKD (chronic  kidney disease) stage 3b, GFR 30-59 ml/min 12/03/2019   Coronary artery disease    Coronary artery disease involving native coronary artery of native heart with angina pectoris (HCC) 10/03/2019   Depression 04/25/2017   Diabetes mellitus due to underlying condition with unspecified complications (HCC) 09/05/2019   Diabetes mellitus with stage 4 chronic kidney disease GFR 15-29 (HCC) 07/20/2022   Essential hypertension 09/05/2019   Ex-smoker 09/05/2019   Family history of coronary artery disease 09/05/2019   History of depression 04/15/2020   Hx of diabetes mellitus 04/15/2020   Hyperlipidemia with target LDL less than 70 11/24/2019   Hyperphosphatemia 07/20/2022   Hypertension    Hypokalemia 04/30/2022   Hypothyroid    Migraine 04/25/2017   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   Syncope and collapse 04/29/2022   Thyroid disease    UTI (urinary tract infection) 04/30/2022    Past Surgical History:  Procedure Laterality Date   ABDOMINAL HYSTERECTOMY     APPENDECTOMY     BIOPSY  12/23/2022   Procedure: BIOPSY;  Surgeon: Jena Gauss,  Gerrit Friends, MD;  [facility]: [facility];  Service: Endoscopy;;   CARDIAC CATHETERIZATION  11/22/2019   catarax Left    CHOLECYSTECTOMY     COLONOSCOPY     COLONOSCOPY WITH PROPOFOL N/A 12/25/2022   Procedure: COLONOSCOPY WITH PROPOFOL;  Surgeon: Dolores Frame, MD;  Location: [facility];  Service: Gastroenterology;  Laterality: N/A;   CORONARY ARTERY BYPASS GRAFT N/A 09/15/2019   Procedure: CORONARY ARTERY BYPASS GRAFTING (CABG) times three on pump using left internal mammary artery and right and left greater saphenous veins harvested  endoscopically;  Surgeon: Alleen Borne, MD;  Location: [facility];  Service: Open Heart Surgery;  Laterality: N/A;   CORONARY STENT INTERVENTION N/A 11/23/2019   Procedure: CORONARY STENT INTERVENTION;  Surgeon: Lennette Bihari, MD;  Location: [facility];  Service: Cardiovascular;  Laterality: N/A;   ESOPHAGEAL DILATION N/A 12/23/2022   Procedure: ESOPHAGEAL DILATION;  Surgeon: Corbin Ade, MD;  [facility]: [facility];  Service: Endoscopy;  Laterality: N/A;   ESOPHAGOGASTRODUODENOSCOPY (EGD) WITH PROPOFOL N/A 12/23/2022   Procedure: ESOPHAGOGASTRODUODENOSCOPY (EGD) WITH PROPOFOL;  Surgeon: Corbin Ade, MD;  [facility];  Service: Endoscopy;  Laterality: N/A;   KNEE ARTHROSCOPY     LEFT HEART CATH AND CORONARY ANGIOGRAPHY N/A 09/14/2019   Procedure: LEFT HEART CATH AND CORONARY ANGIOGRAPHY;  Surgeon: Corky Crafts, MD;  Location[facility] INVASIVE CV LAB;  Service: Cardiovascular;  Laterality: N/A;   LEFT HEART CATH AND CORS/GRAFTS ANGIOGRAPHY N/A 11/22/2019   Procedure: LEFT HEART CATH AND CORS/GRAFTS ANGIOGRAPHY;  Surgeon: Lennette Bihari, MD;  [facility]: [facility];  Service: Cardiovascular;  Laterality: N/A;   LEFT HEART CATH AND CORS/GRAFTS ANGIOGRAPHY N/A 03/13/2020   Procedure: LEFT HEART CATH AND CORS/GRAFTS ANGIOGRAPHY;  Surgeon: Runell Gess, MD;  [facility]: [facility];  Service: Cardiovascular;  Laterality: N/A;   LEFT HEART CATH AND CORS/GRAFTS ANGIOGRAPHY N/A 12/31/2020   Procedure: LEFT HEART CATH AND CORS/GRAFTS ANGIOGRAPHY;  Surgeon: Lyn Records, MD;  [facility]: [facility];  Service: Cardiovascular;  Laterality: N/A;   OSTEOCHONDRAL DEFECT REPAIR/RECONSTRUCTION Left 11/29/2014   Procedure: LEFT ANKLE MEDIAL MALLEOLUS OSTEOTOMY,AUTO GRAFT FROM CALCANEOUS;  OS TIBIA DENORO GRAFTING TALUS;  Surgeon: Toni Arthurs, MD;  Location: [facility];  Service: Orthopedics;  Laterality: Left;   POLYPECTOMY  12/25/2022    Procedure: POLYPECTOMY;  Surgeon: Dolores Frame, MD;  [facility];  Service: Gastroenterology;;   TEE WITHOUT CARDIOVERSION N/A 09/15/2019   Procedure: TRANSESOPHAGEAL ECHOCARDIOGRAM (TEE);  Surgeon: Alleen Borne, MD;  Location: [facility] OR;  Service: Open Heart Surgery;  Laterality: N/A;   TUBAL LIGATION      Current Facility-Administered Medications  Medication Dose Route Frequency Provider Last Rate Last Admin   aspirin tablet 325 mg  325 mg Oral Once Natilee Gauer P, MD       Current Outpatient Medications  Medication Sig Dispense Refill   acetaminophen (TYLENOL) 500 MG tablet Take 2 tablets by mouth 3 times daily as needed for pain 100 tablet 0   aspirin EC 81 MG tablet Take 1 tablet (81 mg total) by mouth daily. 90 tablet 3   buPROPion (WELLBUTRIN XL) 300 MG 24 hr tablet Take 1 tablet (300 mg total) by mouth daily. 90 tablet 2   cholecalciferol 25 MCG (1000 UT) tablet Take 1 tablet (1,000 Units total) by mouth in the morning and at bedtime.     clopidogrel (PLAVIX) 75 MG  tablet TAKE 1 TABLET BY MOUTH DAILY 90 tablet 2   cyanocobalamin (VITAMIN B12) 1000 MCG tablet Take 1 tablet (1,000 mcg total) by mouth daily. 30 tablet 1   dapagliflozin propanediol (FARXIGA) 10 MG TABS tablet Take 1 tablet (10 mg total) by mouth daily before breakfast. Hold medication until follow-up with cardiology service.     ferrous sulfate 325 (65 FE) MG EC tablet Take 325 mg by mouth 2 (two) times daily.     folic acid (FOLVITE) 1 MG tablet Take 1 tablet (1 mg total) by mouth daily. 30 tablet 1   gabapentin (NEURONTIN) 100 MG capsule Take 3 capsules (300 mg total) by mouth at bedtime.     isosorbide mononitrate (IMDUR) 30 MG 24 hr tablet Take 0.5 tablets (15 mg total) by mouth daily. 45 tablet 3   levothyroxine (SYNTHROID) 50 MCG tablet Take 1 tablet (50 mcg total) by mouth See admin instructions. Take 1 tablet in the morning with breakfast on SATURDAY AND SUNDAY.     levothyroxine  (SYNTHROID) 75 MCG tablet Take 1 tablet (75 mcg total) by mouth daily Monday thru Friday. 60 tablet 3   linaclotide (LINZESS) 145 MCG CAPS capsule Take 145 mcg by mouth every other day.     metolazone (ZAROXOLYN) 5 MG tablet Once a week on Wednesdays if you gain weight or swelling appreciated. 12 tablet 1   metoprolol succinate (TOPROL-XL) 25 MG 24 hr tablet Take 0.5 tablets (12.5 mg total) by mouth at bedtime. Take with or immediately following a meal. 90 tablet 3   midodrine (PROAMATINE) 5 MG tablet Take 1 tablet (5 mg total) by mouth 3 (three) times daily with meals. 270 tablet 3   nitroGLYCERIN (NITROSTAT) 0.4 MG SL tablet Place 1 tablet (0.4 mg total) under the tongue every 5 (five) minutes as needed for chest pain. 25 tablet 7   ondansetron (ZOFRAN) 4 MG tablet take 1 tablet (4 mg) by mouth 2 times per day as needed for nausea 180 tablet 1   pantoprazole (PROTONIX) 40 MG tablet Take 1 tablet (40 mg total) by mouth 2 (two) times daily. 60 tablet 1   Polyvinyl Alcohol-Povidone (REFRESH OP) Apply 1 drop to eye 4 (four) times daily.     Potassium Chloride ER 20 MEQ TBCR Take 20 mEq by mouth 2 (two) times daily.     promethazine (PHENERGAN) 25 MG tablet Take 1 tablet (25 mg total) by mouth every 6 (six) hours as needed for nausea or vomiting.     ranolazine (RANEXA) 1000 MG SR tablet Take 1 tablet (1,000 mg total) by mouth 2 (two) times daily. 180 tablet 3   rOPINIRole (REQUIP) 4 MG tablet Take 1 tablet (4 mg total) by mouth 1 to 3 hours before bedtime. 90 tablet 0   rosuvastatin (CRESTOR) 40 MG tablet Take 1 tablet (40 mg total) by mouth at bedtime.     topiramate (TOPAMAX) 100 MG tablet TAKE 1 TABLET BY MOUTH TWICE DAILY. 180 tablet 3   torsemide 60 MG TABS Take 60 mg by mouth 2 (two) times daily. 60 tablet 2   traMADol (ULTRAM) 50 MG tablet Take 1 tablet (50 mg total) by mouth daily as needed for moderate pain.     traZODone (DESYREL) 100 MG tablet Take 150 mg by mouth at bedtime.      Allergies:  Abilify [aripiprazole], Ciprofloxacin, Carbidopa-levodopa, Prednisone, and Venlafaxine   Social History: The patient  reports that she quit smoking about 3 years ago. Her smoking use  included cigarettes. She started smoking about 50 years ago. She has a 47 pack-year smoking history. She has been exposed to tobacco smoke. She has never used smokeless tobacco. She reports that she does not drink alcohol and does not use drugs.   Family History: The patient's family history includes Asthma in her mother; COPD in her mother; Congestive Heart Failure in her father; Diabetes in her mother; Macular degeneration in her mother.   ROS:  Please see the history of present illness. Otherwise, complete review of systems is positive for none.  All other systems are reviewed and negative.   Physical Exam: VS:  BP (!) 180/120   Pulse (!) 101   Ht 5\' 3"  (1.6 m)   Wt 168 lb 12.8 oz (76.6 kg)   SpO2 98%   BMI 29.90 kg/m , BMI Body mass index is 29.9 kg/m.  Wt Readings from Last 3 Encounters:  05/27/23 168 lb 12.8 oz (76.6 kg)  05/24/23 161 lb (73 kg)  05/06/23 156 lb 6.4 oz (70.9 kg)    General: Patient appears comfortable at rest. HEENT: Conjunctiva and lids normal, oropharynx clear with moist mucosa. Neck: Supple, no elevated JVP or carotid bruits, no thyromegaly. Lungs: Clear to auscultation, nonlabored breathing at rest. Cardiac: Regular rate and rhythm, no S3 or significant systolic murmur, no pericardial rub. Abdomen: Soft, nontender, no hepatomegaly, bowel sounds present, no guarding or rebound. Extremities: 3) pitting edema, distal pulses 2+. Skin: Warm and dry. Musculoskeletal: No kyphosis. Neuropsychiatric: Alert and oriented x3, affect grossly appropriate.  Recent Labwork: 01/25/2023: ALT 11; AST 14; Magnesium 2.3 03/17/2023: TSH 6.955 05/06/2023: B Natriuretic Peptide 16.4; BUN 47; Creatinine, Ser 2.21; Hemoglobin 10.8; Platelets 222; Potassium 4.3; Sodium 136     Component  Value Date/Time   CHOL 115 01/27/2023 0336   CHOL 147 03/28/2021 0831   TRIG 193 (H) 01/27/2023 0336   HDL 56 01/27/2023 0336   HDL 71 03/28/2021 0831   CHOLHDL 2.1 01/27/2023 0336   VLDL 39 01/27/2023 0336   LDLCALC 20 01/27/2023 0336   LDLCALC 51 03/28/2021 0831    Other Studies Reviewed Today: Echocardiogram on 12/22/2022 LVEF 60 to 65% G2 DD RV systolic function is normal Normal PASP LA mildly dilated Mild MR No aortic valve lesions  Event monitor in 09/2022 Unremarkable  LHC in 12/2020 LIMA to LAD is patent. Bypass graft to circumflex is patent.  Distribution of native vessel after the insertion is very small.  The native vessel diameter is minuscule. Bypass graft to PDA is patent.  PDA is totally occluded at its ostium and is jailed by stent. Left main is widely patent. LAD is severely and diffusely diseased with 70 to 80% stenosis from proximal to mid.  Competitive flow is noted in the mid LAD due to LIMA graft insertion. Circumflex is totally occluded in the mid vessel.  Very faint collaterals are noted to small "threadlike vessels". Right coronary is widely patent and has a heavy burden of stents that extends from the mid vessel into the continuation beyond the origin of the PDA.  The PDA is jailed.  No significant obstruction is noted in the right coronary other than the PDA which is supplied by a graft. Hyperdynamic left ventricle.  EF greater than 70%.  LVEDP is normal.  Assessment and Plan: Patient is a 62 year old F known to have CAD s/p 3v CABG (LIMA to LAD, SVG to OM and SVG to PDA) with normal LVEF, HTN, DM 2, HLD, CKD stage IIIa is  here for follow-up visit.   Unstable angina CAD s/p 3V CABG (LIMA to LAD, SVG to OM and SVG to PDA) with normal LVEF LHC from 2022 showed patent grafts Chronic diastolic heart failure HLD, at goal   -Ongoing active chest pain x 3 days, taking 8 SL NTG daily and not able to sleep. Previously, had stable angina on antianginal  therapy but uptitration was limited by soft blood pressures.  EKG today in the clinic showed NSR, no ischemia. Has active chest pain during my interview.  Appears to be uncomfortable. She benefit from transfer to [facility] hospital for [facility] today. Currently on aspirin 81 mg and Plavix 75 mg once daily.  She had a light breakfast at 5 AM this morning.  Aspirin 325 mg administered, 911 called and transferred to [facility] for LHC.  Has CKD, serum creatinine improving.  Patient agreeable to this plan. LHC from 2022 showed patent grafts. -Risks and benefits of cardiac catheterization have been discussed with the patient. These include bleeding, infection, kidney damage, stroke, heart attack, death. The patient understands these risks and is willing to proceed. -Continue current antianginal therapy with Imdur 15 mg once daily, metoprolol succinate 12.5 mg once daily. Continue rosuvastatin 40 mg nightly, goal LDL less than 70. -Continue p.o. torsemide 60 mg twice daily, p.o. metolazone 5 mg once per week, following up with advanced heart failure in [place], she has HFMS in place.      Disposition:  Follow up  1 month after LHC  Signed, Cozy Veale Verne Spurr, MD, 05/27/2023 11:33 AM    [facility] at [facility] [address] [place], [place] [address]

## 2023-05-27 NOTE — ED Triage Notes (Signed)
PT BIB EMS from [facility] heart in [place], for a heart catheterization.  Patient states she has had chest pain for days, has taken multiple sublingual nitroglycerin and her pain has not been below a 7.  18 gauge R AC Aspirin 324  144/80 HR 97 18 RR 100 % RA

## 2023-05-27 NOTE — H&P (Addendum)
Cardiology Admission History and Physical   Patient ID: Meghan Terry MRN: [id number]; DOB: 10/30/1960   Admission date: 05/27/2023  PCP:  Benita Stabile, MD   [facility] Providers Cardiologist:  Marjo Bicker, MD     Chief Complaint:  Chest pain, shortness of breath  Patient Profile:   Meghan Terry is a 62 y.o. female with CAD status post three-vessel CABG (LIMA to LAD, SVG to OM, SVG to PDA), PCI x 3 to RCA, hypertension, hyperlipidemia, hyperlipidemia, CKD stage IV, anemia, HFpEF who is being seen 05/27/2023 for the evaluation of chest pain/shortness of breath.   History of Present Illness:   Meghan Terry is a 62 year old female with past medical history noted above.  She was admitted 01/25/2023 to 01/27/2023 with acute CHF exacerbation and responded well to IV Lasix and discharged on torsemide 40 mg daily.  Is again readmitted 5/23 to 02/21/2023 for recurrent CHF exacerbation diuresed with IV Lasix.  She was seen by Dr. Ancil Boozer 5/29 and weight had increased slightly from 135 to 140 pounds and the torsemide was increased to 60 mg twice daily and she was started on metolazone 5 mg 3 times a week.  She was referred to advanced heart failure and seen with Dr. Gasper Lloyd 6/7 and noted to have 2+ pitting eczema on exam.  Was recommended to do Furoscix x1 and increase torsemide to 80 mg twice daily, continued on Farxiga 10 mg daily.  No room for additional therapy given her CKD.  Noted to the ED sick/19 with lower extremity edema and 10 pound weight gain.  Noted her weight had increased to 155 pounds on her home scales, despite her nephrologist increasing her torsemide to 100 mg twice daily along with metolazone.  She was treated with IV Lasix and eventually transition back to torsemide 100 mg twice daily.   Seen in the advanced heart failure clinic on 8/8, had been wearing the ZOLL heart failure monitoring system had noted significant improvement in her lower extremity edema at this  visit.  Given her recurrent hospitalizations for CHF it was recommended that she consider undergoing Cardio-MEMS.  She had a notable improvement in her serum creatinine from 3.5-2.3 at this visit.  She was continued on torsemide 60 mg twice daily, CIGA 10 mg daily, Ranexa 1000 mg twice daily, metoprolol succinate 12.5 mg at bedtime, Crestor 40 mg daily.  She was seen in the office today with Dr. Dannielle Karvonen and reported ongoing chest pain for the past 3 days despite the use of sublingual nitroglycerin.  She was reported to appear uncomfortable while in the clinic and with concerns of active chest pain she was sent to the ED via EMS for further evaluation for consideration of cardiac catheterization.   In the ED labs showed sodium 136, potassium 4.4, creatinine 2.3, high-sensitivity troponin 15, WBC 7.7, hemoglobin 10.4.  Chest x-ray with chronic lung changes with atelectasis and scarring at the bases.  EKG shows sinus tachycardia, 102 bpm, non-specific changes.  She was evaluated by Dr. Lynnette Caffey in the ED and noted to be volume overloaded on exam.  IV fluids were stopped.  It was felt that her chest pain may be related to elevated LVEDP and needed diuresis.  Patient will be admitted for further management.   Past Medical History:  Diagnosis Date   Acute diastolic CHF (congestive heart failure) (HCC) 06/08/2021   Acute pulmonary edema (HCC)    Anemia due to stage 4 chronic kidney disease (HCC) 07/20/2022  Angina pectoris (HCC) 11/17/2019   Anxiety    Atypical chest pain 12/30/2020   Benign hypertension with CKD (chronic kidney disease) stage IV (HCC) 07/20/2022   CHF (congestive heart failure) (HCC) 06/08/2021   CKD (chronic kidney disease) stage 3b, GFR 30-59 ml/min 12/03/2019   Coronary artery disease    Coronary artery disease involving native coronary artery of native heart with angina pectoris (HCC) 10/03/2019   Depression 04/25/2017   Diabetes mellitus due to underlying condition with  unspecified complications (HCC) 09/05/2019   Diabetes mellitus with stage 4 chronic kidney disease GFR 15-29 (HCC) 07/20/2022   Essential hypertension 09/05/2019   Ex-smoker 09/05/2019   Family history of coronary artery disease 09/05/2019   History of depression 04/15/2020   Hx of diabetes mellitus 04/15/2020   Hyperlipidemia with target LDL less than 70 11/24/2019   Hyperphosphatemia 07/20/2022   Hypertension    Hypokalemia 04/30/2022   Hypothyroid    Migraine 04/25/2017   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   Syncope and collapse 04/29/2022   Thyroid disease    UTI (urinary tract infection) 04/30/2022    Past Surgical History:  Procedure Laterality Date   ABDOMINAL HYSTERECTOMY     APPENDECTOMY     BIOPSY  12/23/2022   Procedure: BIOPSY;  Surgeon: Corbin Ade, MD;  [facility]: [facility];  Service: Endoscopy;;   CARDIAC CATHETERIZATION  11/22/2019   catarax Left    CHOLECYSTECTOMY     COLONOSCOPY     COLONOSCOPY WITH PROPOFOL N/A 12/25/2022   Procedure: COLONOSCOPY WITH PROPOFOL;  Surgeon: Dolores Frame, MD;  Location: [facility];  Service: Gastroenterology;  Laterality: N/A;   CORONARY ARTERY BYPASS GRAFT N/A 09/15/2019   Procedure: CORONARY ARTERY BYPASS GRAFTING (CABG) times three on pump using left internal mammary artery and right and left greater saphenous veins harvested endoscopically;  Surgeon: Alleen Borne, MD;  Location: [facility];  Service: Open Heart Surgery;  Laterality: N/A;   CORONARY STENT INTERVENTION N/A 11/23/2019   Procedure: CORONARY STENT INTERVENTION;  Surgeon: Lennette Bihari, MD;  Location: [facility] INVAS[facility] CV LAB;  Service:  Cardiovascular;  Laterality: N/A;   ESOPHAGEAL DILATION N/A 12/23/2022   Procedure: ESOPHAGEAL DILATION;  Surgeon: Corbin Ade, MD;  [facility]: [facility];  Service: Endoscopy;  Laterality: N/A;   ESOPHAGOGASTRODUODENOSCOPY (EGD) WITH PROPOFOL N/A 12/23/2022   Procedure: ESOPHAGOGASTRODUODENOSCOPY (EGD) WITH PROPOFOL;  Surgeon: Corbin Ade, MD;  [facility];  Service: Endoscopy;  Laterality: N/A;   KNEE ARTHROSCOPY     LEFT HEART CATH AND CORONARY ANGIOGRAPHY N/A 09/14/2019   Procedure: LEFT HEART CATH AND CORONARY ANGIOGRAPHY;  Surgeon: Corky Crafts, MD;  Location: [facility] INVASIVE CV LAB;  Service: Cardiovascular;  Laterality: N/A;   LEFT HEART CATH AND CORS/GRAFTS ANGIOGRAPHY N/A 11/22/2019   Procedure: LEFT HEART CATH AND CORS/GRAFTS ANGIOGRAPHY;  Surgeon: Lennette Bihari, MD;  Location: [facility];  Service: Cardiovascular;  Laterality: N/A;   LEFT HEART CATH AND CORS/GRAFTS ANGIOGRAPHY N/A 03/13/2020   Procedure: LEFT HEART CATH AND CORS/GRAFTS ANGIOGRAPHY;  Surgeon: Runell Gess, MD;  Location: [facility];  Service: Cardiovascular;  Laterality:  N/A;   LEFT HEART CATH AND CORS/GRAFTS ANGIOGRAPHY N/A 12/31/2020   Procedure: LEFT HEART CATH AND CORS/GRAFTS ANGIOGRAPHY;  Surgeon: Lyn Records, MD;  Location: [facility];  Service: Cardiovascular;  Laterality: N/A;   OSTEOCHONDRAL DEFECT REPAIR/RECONSTRUCTION Left 11/29/2014   Procedure: LEFT ANKLE MEDIAL MALLEOLUS OSTEOTOMY,AUTO GRAFT FROM CALCANEOUS;  OS TIBIA DENORO GRAFTING TALUS;  Surgeon: Toni Arthurs, MD;  Location: [facility];  Service: Orthopedics;  Laterality: Left;   POLYPECTOMY  12/25/2022   Procedure: POLYPECTOMY;  Surgeon: Dolores Frame, MD;  [facility];  Service: Gastroenterology;;   TEE WITHOUT CARDIOVERSION N/A 09/15/2019   Procedure: TRANSESOPHAGEAL ECHOCARDIOGRAM (TEE);  Surgeon: Alleen Borne, MD;  Location: [facility] OR;  Service: Open  Heart Surgery;  Laterality: N/A;   TUBAL LIGATION       Medications Prior to Admission: Prior to Admission medications   Medication Sig Start Date End Date Taking? Authorizing Provider  acetaminophen (TYLENOL) 500 MG tablet Take 2 tablets by mouth 3 times daily as needed for pain Patient taking differently: Take 1,000 mg by mouth as needed for moderate pain or headache. 12/08/21  Yes   aspirin EC 81 MG tablet Take 1 tablet (81 mg total) by mouth daily. Patient taking differently: Take 81 mg by mouth in the morning. 09/05/19  Yes Revankar, Aundra Dubin, MD  buPROPion (WELLBUTRIN XL) 300 MG 24 hr tablet Take 1 tablet (300 mg total) by mouth daily. 07/06/22  Yes   cholecalciferol 25 MCG (1000 UT) tablet Take 1 tablet (1,000 Units total) by mouth in the morning and at bedtime. 12/25/22  Yes Johnson, Clanford L, MD  clopidogrel (PLAVIX) 75 MG tablet TAKE 1 TABLET BY MOUTH DAILY 03/30/22  Yes Revankar, Aundra Dubin, MD  cyanocobalamin (VITAMIN B12) 1000 MCG tablet Take 1 tablet (1,000 mcg total) by mouth daily. 12/25/22  Yes Johnson, Clanford L, MD  dapagliflozin propanediol (FARXIGA) 10 MG TABS tablet Take 1 tablet (10 mg total) by mouth daily before breakfast. Hold medication until follow-up with cardiology service. 03/20/23  Yes Vassie Loll, MD  ferrous sulfate 325 (65 FE) MG EC tablet Take 325 mg by mouth 2 (two) times daily.   Yes [provider]  folic acid (FOLVITE) 1 MG tablet Take 1 tablet (1 mg total) by mouth daily. 12/26/22  Yes Johnson, Clanford L, MD  gabapentin (NEURONTIN) 100 MG capsule Take 3 capsules (300 mg total) by mouth at bedtime. Patient taking differently: Take 100 mg by mouth 3 (three) times daily. 03/20/23  Yes Vassie Loll, MD  gatifloxacin (ZYMAXID) 0.5 % SOLN Place 1 drop into the left eye 4 (four) times daily. 05/18/23  Yes [provider]  isosorbide mononitrate (IMDUR) 30 MG 24 hr tablet Take 0.5 tablets (15 mg total) by mouth daily. 04/30/23 07/29/23 Yes Mallipeddi,  Vishnu P, MD  ketorolac (ACULAR) 0.5 % ophthalmic solution Place 1 drop into the left eye 4 (four) times daily. 05/18/23  Yes [provider]  levothyroxine (SYNTHROID) 50 MCG tablet Take 1 tablet (50 mcg total) by mouth See admin instructions. Take 1 tablet in the morning with breakfast on SATURDAY AND SUNDAY. 02/21/23  Yes Johnson, Clanford L, MD  levothyroxine (SYNTHROID) 75 MCG tablet Take 1 tablet (75 mcg total) by mouth daily Monday thru Friday. 06/08/22  Yes   linaclotide (LINZESS) 145 MCG CAPS capsule Take 145 mcg by mouth as needed (diarrhea).   Yes [provider]  metolazone (ZAROXOLYN) 5 MG tablet Once a week on  Wednesdays if you gain weight or swelling appreciated. Patient taking differently: Take 5 mg by mouth once a week. Wednesday 03/22/23  Yes Vassie Loll, MD  midodrine (PROAMATINE) 5 MG tablet Take 1 tablet (5 mg total) by mouth 3 (three) times daily with meals. 04/30/23  Yes Mallipeddi, Vishnu P, MD  nitroGLYCERIN (NITROSTAT) 0.4 MG SL tablet Place 1 tablet (0.4 mg total) under the tongue every 5 (five) minutes as needed for chest pain. 12/29/21  Yes Revankar, Aundra Dubin, MD  ondansetron (ZOFRAN) 4 MG tablet take 1 tablet (4 mg) by mouth 2 times per day as needed for nausea Patient taking differently: Take 4 mg by mouth as needed for nausea or vomiting. 05/12/22  Yes   pantoprazole (PROTONIX) 40 MG tablet Take 1 tablet (40 mg total) by mouth 2 (two) times daily. 12/25/22  Yes Johnson, Clanford L, MD  potassium chloride SA (KLOR-CON M) 20 MEQ tablet Take 40 mEq by mouth 2 (two) times daily. 03/23/23  Yes [provider]  prednisoLONE acetate (PRED FORTE) 1 % ophthalmic suspension Place 1 drop into the left eye in the morning, at noon, and at bedtime. 05/18/23  Yes [provider]  promethazine (PHENERGAN) 25 MG tablet Take 1 tablet (25 mg total) by mouth every 6 (six) hours as needed for nausea or vomiting. Patient taking differently: Take 25 mg by mouth as  needed for nausea or vomiting. 12/25/22  Yes Johnson, Clanford L, MD  ranolazine (RANEXA) 1000 MG SR tablet Take 1 tablet (1,000 mg total) by mouth 2 (two) times daily. 02/04/23  Yes Mallipeddi, Vishnu P, MD  rOPINIRole (REQUIP) 4 MG tablet Take 1 tablet (4 mg total) by mouth 1 to 3 hours before bedtime. 06/29/22  Yes   rosuvastatin (CRESTOR) 40 MG tablet Take 1 tablet (40 mg total) by mouth at bedtime. 12/25/22  Yes Johnson, Clanford L, MD  topiramate (TOPAMAX) 100 MG tablet TAKE 1 TABLET BY MOUTH TWICE DAILY. 05/12/22  Yes   torsemide (DEMADEX) 20 MG tablet Take 60 mg by mouth 2 (two) times daily.   Yes [provider]  traMADol (ULTRAM) 50 MG tablet Take 1 tablet (50 mg total) by mouth daily as needed for moderate pain. 02/21/23  Yes Johnson, Clanford L, MD  traZODone (DESYREL) 150 MG tablet Take 150 mg by mouth at bedtime.   Yes [provider]  traZODone (DESYREL) 50 MG tablet Take 50 mg by mouth at bedtime.   Yes [provider]  metoprolol succinate (TOPROL-XL) 25 MG 24 hr tablet Take 0.5 tablets (12.5 mg total) by mouth at bedtime. Take with or immediately following a meal. Patient not taking: Reported on 05/27/2023 12/25/22   Cleora Fleet, MD     Allergies:    Allergies  Allergen Reactions   Abilify [Aripiprazole] Other (See Comments)    "Makes me sick"    Ciprofloxacin Nausea And Vomiting   Carbidopa-Levodopa Other (See Comments)    Developed tics while taking    Prednisone Other (See Comments)    Turns red all over    Venlafaxine Other (See Comments)    Developed tics while taking - reaction to Effexor     Social History:   Social History   Socioeconomic History   Marital status: Married    Spouse name: Raynisha Jalali   Number of children: 2   Years of education: Not on file   Highest education level: Associate degree: occupational, Scientist, product/process development, or vocational program  Occupational History   Occupation: disabilty  Comment: EMT/CNA @ [employer]  Tobacco Use   Smoking status: Former    Current packs/day: 0.00    Average packs/day: 1 pack/day for 47.0 years (47.0 ttl pk-yrs)    Types: Cigarettes    Start date: 09/14/1972    Quit date: 09/15/2019    Years since quitting: 3.6    Passive exposure: Past   Smokeless tobacco: Never  Vaping Use   Vaping status: Every Day   Start date: 09/15/2019   Substances: Nicotine  Substance and Sexual Activity   Alcohol use: No   Drug use: No   Sexual activity: Not on file  Other Topics Concern   Not on file  Social History Narrative   Right handed   Caffeine-1-2 cups daily   Social Determinants of Health   Financial Resource Strain: High Risk (06/19/2021)   Overall Financial Resource Strain (CARDIA)    Difficulty of Paying Living Expenses: Very hard  Food Insecurity: Low Risk  (04/29/2023)   Received from [facility]   Food vital sign    Within the past 12 months, you worried that your food would run out before you got money to buy more: Never true    Within the past 12 months, the food you bought just didn't last and you didn't have money to get more. : Never true  Transportation Needs: Not on file (04/29/2023)  Physical Activity: Not on file  Stress: Not on file  Social Connections: Not on file  Intimate Partner Violence: Not At Risk (03/17/2023)   Humiliation, Afraid, Rape, and Kick questionnaire    Fear of Current or Ex-Partner: No    Emotionally Abused: No    Physically Abused: No    Sexually Abused: No    Family History:   The patient's family history includes Asthma in her mother; COPD in her mother; Congestive Heart Failure in her father; Diabetes in her mother; Macular degeneration in her mother.    ROS:  Please see the history of present illness.  All other ROS reviewed and negative.     Physical Exam/Data:   Vitals:   05/27/23 1214 05/27/23 1242  BP: 133/77   Pulse: 91   Resp: 18   Temp:  98 F (36.7 C)  TempSrc:  Oral  SpO2: 100%    No intake or  output data in the 24 hours ending 05/27/23 1413    05/27/2023    9:48 AM 05/24/2023    9:59 AM 05/06/2023    9:27 AM  Last 3 Weights  Weight (lbs) 168 lb 12.8 oz 161 lb 156 lb 6.4 oz  Weight (kg) 76.567 kg 73.029 kg 70.943 kg     There is no height or weight on file to calculate BMI.   Per MD  EKG:  The ECG that was done 05/27/2023 was personally reviewed and demonstrates sinus tachycardia, 102 bpm, non-specific changes   Relevant CV Studies:  Echo: 11/2022  IMPRESSIONS     1. Left ventricular ejection fraction, by estimation, is 60 to 65%. The  left ventricle has normal function. The left ventricle has no regional  wall motion abnormalities. Left ventricular diastolic parameters are  consistent with Grade II diastolic  dysfunction (pseudonormalization).   2. Right ventricular systolic function is normal. The right ventricular  size is normal. There is normal pulmonary artery systolic pressure. The  estimated right ventricular systolic pressure is 19.0 mmHg.   3. Left atrial size was mildly dilated.   4. The mitral valve is  grossly normal. Mild mitral valve regurgitation.   5. The aortic valve is tricuspid. There is mild calcification of the  aortic valve. Aortic valve regurgitation is not visualized. Aortic valve  sclerosis is present, with no evidence of aortic valve stenosis.   6. The inferior vena cava is normal in size with greater than 50%  respiratory variability, suggesting right atrial pressure of 3 mmHg.   Comparison(s): Prior images reviewed side by side. LVEF remains normal  range at 60-65%. RV contraction is normal.   FINDINGS   Left Ventricle: Left ventricular ejection fraction, by estimation, is 60  to 65%. The left ventricle has normal function. The left ventricle has no  regional wall motion abnormalities. The left ventricular internal cavity  size was normal in size. There is   no left ventricular hypertrophy. Left ventricular diastolic parameters  are  consistent with Grade II diastolic dysfunction (pseudonormalization).   Right Ventricle: The right ventricular size is normal. No increase in  right ventricular wall thickness. Right ventricular systolic function is  normal. There is normal pulmonary artery systolic pressure. The tricuspid  regurgitant velocity is 2.00 m/s, and   with an assumed right atrial pressure of 3 mmHg, the estimated right  ventricular systolic pressure is 19.0 mmHg.   Left [facility]: Left atrial size was mildly dilated.   Right [facility]: Right atrial size was normal in size.   Pericardium: There is no evidence of pericardial effusion.   Mitral Valve: The mitral valve is grossly normal. Mild mitral valve  regurgitation.   Tricuspid Valve: The tricuspid valve is grossly normal. Tricuspid valve  regurgitation is trivial.   Aortic Valve: The aortic valve is tricuspid. There is mild calcification  of the aortic valve. Aortic valve regurgitation is not visualized. Aortic  valve sclerosis is present, with no evidence of aortic valve stenosis.   Pulmonic Valve: The pulmonic valve was grossly normal. Pulmonic valve  regurgitation is not visualized.   Aorta: The aortic root is normal in size and structure.   Venous: The inferior vena cava is normal in size with greater than 50%  respiratory variability, suggesting right atrial pressure of 3 mmHg.   IAS/Shunts: No atrial level shunt detected by color flow Doppler.   Laboratory Data:  High Sensitivity Troponin:   Recent Labs  Lab 05/27/23 1145  TROPONINIHS 15      Chemistry Recent Labs  Lab 05/27/23 1145  NA 136  K 4.4  CL 99  CO2 28  GLUCOSE 143*  BUN 37*  CREATININE 2.30*  CALCIUM 9.4  GFRNONAA 23*  ANIONGAP 9    No results for input(s): "PROT", "ALBUMIN", "AST", "ALT", "ALKPHOS", "BILITOT" in the last 168 hours. Lipids No results for input(s): "CHOL", "TRIG", "HDL", "LABVLDL", "LDLCALC", "CHOLHDL" in the last 168 hours. Hematology Recent Labs   Lab 05/27/23 1145  WBC 7.7  RBC 3.46*  HGB 10.4*  HCT 32.5*  MCV 93.9  MCH 30.1  MCHC 32.0  RDW 13.4  PLT 200   Thyroid No results for input(s): "TSH", "FREET4" in the last 168 hours. BNPNo results for input(s): "BNP", "PROBNP" in the last 168 hours.  DDimer No results for input(s): "DDIMER" in the last 168 hours.   Radiology/Studies:  DG Chest 2 View  Result Date: 05/27/2023 CLINICAL DATA:  Chest pain. EXAM: CHEST - 2 VIEW COMPARISON:  Chest x-ray March 17, 2023. FINDINGS: Similar chronic lung changes with atelectasis and or scarring at the lung bases. No new consolidation. No visible pleural effusions or pneumothorax.  Cardiomediastinal silhouette is within normal limits. CABG and median sternotomy. IMPRESSION: Similar chronic lung changes with atelectasis and/or scarring at the lung bases. No new abnormality. Electronically Signed   By: Feliberto Harts M.D.   On: 05/27/2023 12:47     Assessment and Plan:   LISANNA KETT is a 62 y.o. female with CAD status post three-vessel CABG (LIMA to LAD, SVG to OM, SVG to PDA), PCI x 3 to RCA, hypertension, hyperlipidemia, hyperlipidemia, CKD stage IV, anemia, HFpEF who is being seen 05/27/2023 for the evaluation of chest pain/shortness of breath.  Acute on Chronic HFpEF -- most recent echo 11/2022 with LVEF of 60-65%, normal RV, g2DD, mildly dilated LA, mild MR -- reported she is up about 20lbs from baseline weight, poor dietary indiscretion recently. Weight 70kg at outpt office visit on 8/8, up to 76kg at visit today -- has been on torsemide 60mg  BID along with metolazone 5mg  3x/week PTA -- check BNP -- hold home torsemide, give IV lasix 80mg  x1 now, follow up response to determine additional dosing -- GDMT: continue farxiga, metoprolol succinate 12.5mg  daily   Chest pain CAD s/p 3v CABG (LIMA-LAD, SVG-OM and SVG-PDA) -- presented with 3 days of ongoing chest pain, suspect may be related to volume overload -- hsTn 15, repeat pending --  initial plan for cardiac cath per primary cardiology, but felt symptoms may be related to volume overload. Cardiac deferred for now -- continue ASA, plavix, statin, Imdur 15mg  daily, Ranexa 1000mg  BID  HLD -- continue crestor 40mg  daily  CKD IV -- baseline Cr around 2.5-2.8, noted at 2.3 on admission -- follow BMET  Hypothyroidism -- continue synthroid 75mg  daily   Risk Assessment/Risk Scores:   New York Heart Association (NYHA) Functional Class NYHA Class III   Code Status: Full Code  Severity of Illness: The appropriate patient status for this patient is INPATIENT. Inpatient status is judged to be reasonable and necessary in order to provide the required intensity of service to ensure the patient's safety. The patient's presenting symptoms, physical exam findings, and initial radiographic and laboratory data in the context of their chronic comorbidities is felt to place them at high risk for further clinical deterioration. Furthermore, it is not anticipated that the patient will be medically stable for discharge from the hospital within 2 midnights of admission.   * I certify that at the point of admission it is my clinical judgment that the patient will require inpatient hospital care spanning beyond 2 midnights from the point of admission due to high intensity of service, high risk for further deterioration and high frequency of surveillance required.*   For questions or updates, please contact [facility] Please consult [email or web address] for contact info under     Signed, Laverda Page, NP  05/27/2023 2:13 PM   ATTENDING ATTESTATION:  After conducting a review of all available clinical information with the care team, interviewing the patient, and performing a physical exam, I agree with the findings and plan described in this note.   GEN: No acute distress.   HEENT:  MMM, JVD of around 8 cm, no scleral icterus Cardiac: RRR, no murmurs, rubs, or gallops.   Respiratory: Clear to auscultation bilaterally. GI: Soft, nontender, non-distended  MS: +1 edema; No deformity.  Warm and well-perfused Neuro:  Nonfocal  Vasc:  +2 radial pulses   The patient is a very nice 62 year old female with a history of coronary artery disease status post LIMA to LAD, vein graft to OM, and vein  graft to PDA with PCI of the right coronary artery with DES x 3, hypertension, hyperlipidemia, CKD stage IV, and heart failure with preserved ejection fraction who is here with chest pain syndrome.  The patient tells me that she has had 3 days worth of acute onset chest pain.  She is also gained about 20 pounds on her own admission.  She feels like her legs are more swollen.  She denies any paroxysmal nocturnal dyspnea however.  Her initial EKG demonstrated no ischemic changes.  Initial troponin is negative.  She looks to be fairly comfortable now that her blood pressure is in the normal range.  Of note when she saw her primary cardiologist on August 2 her weight was 71.2 kg.  She saw advanced heart failure on August 8 her weight was 70.9 kg.  Her weight at her primary cardiologist office today was 76.6 kg.  Her systolic blood pressure was noted to be 180 mmHg as well.  I did have a long conversation with the patient.  Given her very significant kidney disease I think it best to treat her medically initially.  We will defer coronary angiography at this time.  Her initial troponin is negative and I would expect if she was having an acute coronary syndrome over the last 3 days that this would be positive.  I think we will go ahead and diurese her to see if this improves her symptoms.  I believe her symptoms may be due to elevated LVEDP.  Certainly if her next set of troponins are concerning we will need to consider the risks of renal impairment with an IV dye load versus the benefits of coronary angiography.  Will make n.p.o. at midnight tonight in case coronary angiography or right heart  catheterization is required tomorrow.  Alverda Skeans, MD Pager [phone]

## 2023-05-27 NOTE — Progress Notes (Addendum)
Called by cath lab regarding urgent coronary angiography for patient with 3 days of variable chest pain at rest.  Patient with history of HFPEF, CAD s/p CABG, CKD IV, HTN, HL, T2DM.  First troponin negative and EKG nonischemic.  Patient saw Dr. Gasper Lloyd on 8/8 (weight at that visit 70kg)  Saw Dr. Jenene Slicker today, BP was 180 systolic and weight was 76.6kg.  Patient states she has gained 20lb in a few weeks (likely due to dietary indiscretion).  Will stop IVF, admit to hospital, and treat for presumptive acute on chronic diastolic HF.  I suspect chest pain syndrome is from elevated LVEDP.  Will need judicious diuresis.  Defer coronary angiography at this time.  Trend troponins, control BP.  Patient chest pain syndrome improving with improved BP control.   Discussed with patient.

## 2023-05-27 NOTE — Patient Instructions (Signed)
Medication Instructions:  Your physician recommends that you continue on your current medications as directed. Please refer to the Current Medication list given to you today.   Labwork: None  Testing/Procedures: None  Follow-Up: Your physician recommends that you schedule a follow-up appointment in: Sent to ED at [facility]  Any Other Special Instructions Will Be Listed Below (If Applicable).  If you need a refill on your cardiac medications before your next appointment, please call your pharmacy.

## 2023-05-27 NOTE — Progress Notes (Signed)
Heart Failure Navigator Progress Note  Assessed for Heart & Vascular TOC clinic readiness.  Patient does not meet criteria due to been established patient of Dr. Gasper Lloyd.   Navigator will sign off at this time.   Acsa Estey,RN, BSN,MSN Heart Failure Nurse Navigator. Contact by secure chat only.

## 2023-05-27 NOTE — ED Provider Notes (Signed)
[place] EMERGENCY DEPARTMENT AT [facility] Provider Note   CSN: [id number] Arrival date & time: 05/27/23  1124     History  Chief Complaint  Patient presents with   Vascular Access Problem    Heart     Meghan Terry is a 61 y.o. female.  Patient with history of CAD, CABG, CKD, CHF, CKD, hypertension presents today with complaints of chest pain.  States this has been ongoing for over a week.  She was at her cardiologist's office earlier today with these complaints and was told to come here for a heart cath. She presents via EMS. States that her pain feels similar to when she has had previous stents placed.  Pain feels like pressure that is substernal.  It is improved with nitroglycerin. Notes shortness of breath that is exertional only.  Also notes some nausea without vomiting.   The history is provided by the patient. No language interpreter was used.       Home Medications Prior to Admission medications   Medication Sig Start Date End Date Taking? Authorizing Provider  acetaminophen (TYLENOL) 500 MG tablet Take 2 tablets by mouth 3 times daily as needed for pain Patient taking differently: Take 1,000 mg by mouth as needed for moderate pain or headache. 12/08/21  Yes   aspirin EC 81 MG tablet Take 1 tablet (81 mg total) by mouth daily. Patient taking differently: Take 81 mg by mouth in the morning. 09/05/19  Yes Revankar, Aundra Dubin, MD  buPROPion (WELLBUTRIN XL) 300 MG 24 hr tablet Take 1 tablet (300 mg total) by mouth daily. 07/06/22  Yes   cholecalciferol 25 MCG (1000 UT) tablet Take 1 tablet (1,000 Units total) by mouth in the morning and at bedtime. 12/25/22  Yes Johnson, Clanford L, MD  clopidogrel (PLAVIX) 75 MG tablet TAKE 1 TABLET BY MOUTH DAILY 03/30/22  Yes Revankar, Aundra Dubin, MD  cyanocobalamin (VITAMIN B12) 1000 MCG tablet Take 1 tablet (1,000 mcg total) by mouth daily. 12/25/22  Yes Johnson, Clanford L, MD  dapagliflozin propanediol (FARXIGA) 10 MG TABS tablet Take  1 tablet (10 mg total) by mouth daily before breakfast. Hold medication until follow-up with cardiology service. 03/20/23  Yes Vassie Loll, MD  ferrous sulfate 325 (65 FE) MG EC tablet Take 325 mg by mouth 2 (two) times daily.   Yes [provider]  folic acid (FOLVITE) 1 MG tablet Take 1 tablet (1 mg total) by mouth daily. 12/26/22  Yes Johnson, Clanford L, MD  gabapentin (NEURONTIN) 100 MG capsule Take 3 capsules (300 mg total) by mouth at bedtime. Patient taking differently: Take 100 mg by mouth 3 (three) times daily. 03/20/23  Yes Vassie Loll, MD  gatifloxacin (ZYMAXID) 0.5 % SOLN Place 1 drop into the left eye 4 (four) times daily. 05/18/23  Yes [provider]  isosorbide mononitrate (IMDUR) 30 MG 24 hr tablet Take 0.5 tablets (15 mg total) by mouth daily. 04/30/23 07/29/23 Yes Mallipeddi, Vishnu P, MD  ketorolac (ACULAR) 0.5 % ophthalmic solution Place 1 drop into the left eye 4 (four) times daily. 05/18/23  Yes [provider]  levothyroxine (SYNTHROID) 50 MCG tablet Take 1 tablet (50 mcg total) by mouth See admin instructions. Take 1 tablet in the morning with breakfast on SATURDAY AND SUNDAY. 02/21/23  Yes Johnson, Clanford L, MD  levothyroxine (SYNTHROID) 75 MCG tablet Take 1 tablet (75 mcg total) by mouth daily Monday thru Friday. 06/08/22  Yes   linaclotide (LINZESS) 145 MCG CAPS  capsule Take 145 mcg by mouth as needed (diarrhea).   Yes [provider]  metolazone (ZAROXOLYN) 5 MG tablet Once a week on Wednesdays if you gain weight or swelling appreciated. Patient taking differently: Take 5 mg by mouth once a week. Wednesday 03/22/23  Yes Vassie Loll, MD  midodrine (PROAMATINE) 5 MG tablet Take 1 tablet (5 mg total) by mouth 3 (three) times daily with meals. 04/30/23  Yes Mallipeddi, Vishnu P, MD  nitroGLYCERIN (NITROSTAT) 0.4 MG SL tablet Place 1 tablet (0.4 mg total) under the tongue every 5 (five) minutes as needed for chest pain. 12/29/21  Yes Revankar,  Aundra Dubin, MD  ondansetron (ZOFRAN) 4 MG tablet take 1 tablet (4 mg) by mouth 2 times per day as needed for nausea Patient taking differently: Take 4 mg by mouth as needed for nausea or vomiting. 05/12/22  Yes   pantoprazole (PROTONIX) 40 MG tablet Take 1 tablet (40 mg total) by mouth 2 (two) times daily. 12/25/22  Yes Johnson, Clanford L, MD  potassium chloride SA (KLOR-CON M) 20 MEQ tablet Take 40 mEq by mouth 2 (two) times daily. 03/23/23  Yes [provider]  prednisoLONE acetate (PRED FORTE) 1 % ophthalmic suspension Place 1 drop into the left eye in the morning, at noon, and at bedtime. 05/18/23  Yes [provider]  promethazine (PHENERGAN) 25 MG tablet Take 1 tablet (25 mg total) by mouth every 6 (six) hours as needed for nausea or vomiting. Patient taking differently: Take 25 mg by mouth as needed for nausea or vomiting. 12/25/22  Yes Johnson, Clanford L, MD  ranolazine (RANEXA) 1000 MG SR tablet Take 1 tablet (1,000 mg total) by mouth 2 (two) times daily. 02/04/23  Yes Mallipeddi, Vishnu P, MD  rOPINIRole (REQUIP) 4 MG tablet Take 1 tablet (4 mg total) by mouth 1 to 3 hours before bedtime. 06/29/22  Yes   rosuvastatin (CRESTOR) 40 MG tablet Take 1 tablet (40 mg total) by mouth at bedtime. 12/25/22  Yes Johnson, Clanford L, MD  topiramate (TOPAMAX) 100 MG tablet TAKE 1 TABLET BY MOUTH TWICE DAILY. 05/12/22  Yes   torsemide (DEMADEX) 20 MG tablet Take 60 mg by mouth 2 (two) times daily.   Yes [provider]  traMADol (ULTRAM) 50 MG tablet Take 1 tablet (50 mg total) by mouth daily as needed for moderate pain. 02/21/23  Yes Johnson, Clanford L, MD  traZODone (DESYREL) 150 MG tablet Take 150 mg by mouth at bedtime.   Yes [provider]  traZODone (DESYREL) 50 MG tablet Take 50 mg by mouth at bedtime.   Yes [provider]  metoprolol succinate (TOPROL-XL) 25 MG 24 hr tablet Take 0.5 tablets (12.5 mg total) by mouth at bedtime. Take with or immediately following  a meal. Patient not taking: Reported on 05/27/2023 12/25/22   Cleora Fleet, MD      Allergies    Abilify [aripiprazole], Ciprofloxacin, Carbidopa-levodopa, Prednisone, and Venlafaxine    Review of Systems   Review of Systems  Respiratory:  Positive for shortness of breath.   Cardiovascular:  Positive for chest pain.  All other systems reviewed and are negative.   Physical Exam Updated Vital Signs BP 127/64   Pulse 87   Temp 98 F (36.7 C) (Oral)   Resp 20   SpO2 100%  Physical Exam Vitals and nursing note reviewed.  Constitutional:      General: She is not in acute distress.    Appearance: Normal appearance. She is  normal weight. She is not ill-appearing, toxic-appearing or diaphoretic.  HENT:     Head: Normocephalic and atraumatic.  Cardiovascular:     Rate and Rhythm: Normal rate and regular rhythm.     Heart sounds: Normal heart sounds.  Pulmonary:     Effort: Pulmonary effort is normal. No respiratory distress.     Breath sounds: Normal breath sounds.  Abdominal:     General: Abdomen is flat.     Palpations: Abdomen is soft.     Tenderness: There is no abdominal tenderness.  Musculoskeletal:        General: Normal range of motion.     Cervical back: Normal range of motion.  Skin:    General: Skin is warm and dry.  Neurological:     General: No focal deficit present.     Mental Status: She is alert.  Psychiatric:        Mood and Affect: Mood normal.        Behavior: Behavior normal.     ED Results / Procedures / Treatments   Labs (all labs ordered are listed, but only abnormal results are displayed) Labs Reviewed  BASIC METABOLIC PANEL - Abnormal; Notable for the following components:      Result Value   Glucose, Bld 143 (*)    BUN 37 (*)    Creatinine, Ser 2.30 (*)    GFR, Estimated 23 (*)    All other components within normal limits  CBC - Abnormal; Notable for the following components:   RBC 3.46 (*)    Hemoglobin 10.4 (*)    HCT 32.5 (*)     All other components within normal limits  BRAIN NATRIURETIC PEPTIDE  TROPONIN I (HIGH SENSITIVITY)  TROPONIN I (HIGH SENSITIVITY)    EKG None  Radiology DG Chest 2 View  Result Date: 05/27/2023 CLINICAL DATA:  Chest pain. EXAM: CHEST - 2 VIEW COMPARISON:  Chest x-ray March 17, 2023. FINDINGS: Similar chronic lung changes with atelectasis and or scarring at the lung bases. No new consolidation. No visible pleural effusions or pneumothorax. Cardiomediastinal silhouette is within normal limits. CABG and median sternotomy. IMPRESSION: Similar chronic lung changes with atelectasis and/or scarring at the lung bases. No new abnormality. Electronically Signed   By: Feliberto Harts M.D.   On: 05/27/2023 12:47    Procedures Procedures    Medications Ordered in ED Medications  0.9% sodium chloride infusion (0 mL/kg/hr  76.6 kg Intravenous Stopped 05/27/23 1245)    Followed by  0.9% sodium chloride infusion (0 mL/kg/hr  76.6 kg Intravenous Stopped 05/27/23 1430)  clopidogrel (PLAVIX) tablet 75 mg (75 mg Oral Not Given 05/27/23 1540)  aspirin chewable tablet 81 mg (81 mg Oral Given 05/27/23 1538)    ED Course/ Medical Decision Making/ A&P                                 Medical Decision Making Amount and/or Complexity of Data Reviewed Radiology: ordered.  Risk Decision regarding hospitalization.   This patient is a 62 y.o. female who presents to the ED for concern of chest pain, this involves an extensive number of treatment options, and is a complaint that carries with it a high risk of complications and morbidity. The emergent differential diagnosis prior to evaluation includes, but is not limited to,  ACS, pericarditis, myocarditis, aortic dissection, PE, pneumothorax, esophageal spasm or rupture, chronic angina, pneumonia, bronchitis, GERD, reflux/PUD, biliary disease, pancreatitis,  costochondritis, anxiety   This is not an exhaustive differential.   Past Medical History /  Co-morbidities / Social History:  has a past medical history of Acute diastolic CHF (congestive heart failure) (HCC) (06/08/2021), Acute pulmonary edema (HCC), Anemia due to stage 4 chronic kidney disease (HCC) (07/20/2022), Angina pectoris (HCC) (11/17/2019), Anxiety, Atypical chest pain (12/30/2020), Benign hypertension with CKD (chronic kidney disease) stage IV (HCC) (07/20/2022), CHF (congestive heart failure) (HCC) (06/08/2021), CKD (chronic kidney disease) stage 3b, GFR 30-59 ml/min (12/03/2019), Coronary artery disease, Coronary artery disease involving native coronary artery of native heart with angina pectoris (HCC) (10/03/2019), Depression (04/25/2017), Diabetes mellitus due to underlying condition with unspecified complications (HCC) (09/05/2019), Diabetes mellitus with stage 4 chronic kidney disease GFR 15-29 (HCC) (07/20/2022), Essential hypertension (09/05/2019), Ex-smoker (09/05/2019), Family history of coronary artery disease (09/05/2019), History of depression (04/15/2020), diabetes mellitus (04/15/2020), Hyperlipidemia with target LDL less than 70 (11/24/2019), Hyperphosphatemia (07/20/2022), Hypertension, Hypokalemia (04/30/2022), Hypothyroid, Migraine (04/25/2017), 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  SDOH Screenings   Food Insecurity: Low Risk  (04/29/2023)   Received from [facility]  Housing: Low Risk  (03/17/2023)  Utilities: Low Risk  (04/29/2023)   Received from [facility]  Alcohol Screen: Low Risk  (06/10/2021)  Financial Resource Strain: High Risk (06/19/2021)  Tobacco Use: Medium Risk (05/27/2023)   Additional history: Chart reviewed. Pertinent results include:  Seen by [facility] heart[facility] Dr. Jenene Slicker earlier today, EMS was called and patient transported here with plans for heart cath today.  Physical Exam: Physical exam performed. The pertinent findings include: Per above, no acute physical exam abnormalities  Lab Tests: I ordered, and personally interpreted labs.  The pertinent results include:  CKD per baseline. First troponin negative   Imaging Studies: I ordered imaging studies including CXR. I independently visualized and interpreted imaging which showed   Similar chronic lung changes with atelectasis and/or scarring at the lung bases. No new abnormality.  I agree with the radiologist interpretation.   Cardiac Monitoring:  The patient was maintained on a cardiac monitor.  My attending physician Dr. Adela Lank viewed and interpreted the cardiac monitored which showed an underlying rhythm of: sinus rhythm. I agree with this interpretation.  Consultations Obtained: I requested consultation with the cardiology on-call,  and discussed lab and imaging findings as well as pertinent plan - they recommend: They will see and admit the patient, plan for heart cath later today.   Disposition: After consideration of the diagnostic results and the patients response to treatment, I feel that patient will require admission for heart cath per cardiology recommendations.  Discussed with patient is understanding and in agreement with this.   Final Clinical Impression(s) / ED Diagnoses Final diagnoses:  Chest pain, unspecified type    Rx / DC Orders ED Discharge Orders     None         Vear Clock 05/27/23 1621    Melene Plan, DO 05/28/23 [phone]

## 2023-05-28 ENCOUNTER — Inpatient Hospital Stay (HOSPITAL_COMMUNITY): Payer: Medicare Other

## 2023-05-28 DIAGNOSIS — R072 Precordial pain: Secondary | ICD-10-CM

## 2023-05-28 DIAGNOSIS — I5031 Acute diastolic (congestive) heart failure: Secondary | ICD-10-CM | POA: Diagnosis not present

## 2023-05-28 LAB — ECHOCARDIOGRAM COMPLETE
AR max vel: 2.49 cm2
AV Area VTI: 2.53 cm2
AV Area mean vel: 2.45 cm2
AV Mean grad: 8 mmHg
AV Peak grad: 14.9 mmHg
Ao pk vel: 1.93 m/s
Area-P 1/2: 3.99 cm2
Height: 63 in
S' Lateral: 2.6 cm
Weight: 2567.92 [oz_av]

## 2023-05-28 LAB — BASIC METABOLIC PANEL
Anion gap: 11 (ref 5–15)
BUN: 46 mg/dL — ABNORMAL HIGH (ref 8–23)
CO2: 26 mmol/L (ref 22–32)
Calcium: 8.6 mg/dL — ABNORMAL LOW (ref 8.9–10.3)
Chloride: 97 mmol/L — ABNORMAL LOW (ref 98–111)
Creatinine, Ser: 2.73 mg/dL — ABNORMAL HIGH (ref 0.44–1.00)
GFR, Estimated: 19 mL/min — ABNORMAL LOW (ref >60)
Glucose, Bld: 163 mg/dL — ABNORMAL HIGH (ref 70–99)
Potassium: 4.1 mmol/L (ref 3.5–5.1)
Sodium: 134 mmol/L — ABNORMAL LOW (ref 135–145)

## 2023-05-28 MED ORDER — ALUM & MAG HYDROXIDE-SIMETH 200-200-20 MG/5ML PO SUSP
30.0000 mL | Freq: Once | ORAL | Status: AC
  Administered 2023-05-28: 30 mL via ORAL
  Filled 2023-05-28: qty 30

## 2023-05-28 NOTE — Progress Notes (Signed)
Progress Note  Patient Name: Meghan Terry Date of Encounter: 05/28/2023  Primary Cardiologist:   Marjo Bicker, MD   Subjective   She still has chronic dull 4/10 pain.   Breathing is actually OK.    Inpatient Medications    Scheduled Meds:  aspirin EC  81 mg Oral Daily   buPROPion  300 mg Oral Daily   clopidogrel  75 mg Oral Once   clopidogrel  75 mg Oral Daily   dapagliflozin propanediol  10 mg Oral QAC breakfast   gabapentin  300 mg Oral QHS   heparin  5,000 Units Subcutaneous Q8H   isosorbide mononitrate  15 mg Oral Daily   [START ON 05/29/2023] levothyroxine  50 mcg Oral Once per day on Sunday Saturday   levothyroxine  75 mcg Oral Once per day on Monday Tuesday Wednesday Thursday Friday   pantoprazole  40 mg Oral BID   ranolazine  1,000 mg Oral BID   rOPINIRole  4 mg Oral QHS   rosuvastatin  40 mg Oral QHS   sodium chloride flush  3 mL Intravenous Q12H   traZODone  200 mg Oral QHS   Continuous Infusions:  sodium chloride     sodium chloride Stopped (05/27/23 1430)   PRN Meds: sodium chloride, acetaminophen, ondansetron (ZOFRAN) IV, sodium chloride flush   Vital Signs    Vitals:   05/28/23 0135 05/28/23 0426 05/28/23 0600 05/28/23 0726  BP: (!) 118/49 (!) 129/43 (!) 91/40 (!) 91/52  Pulse: 82 81 73 88  Resp: 18 16 19  (!) 23  Temp:  98.3 F (36.8 C)  98.4 F (36.9 C)  TempSrc:  Oral  Oral  SpO2: 95% 96% 96% 99%  Weight:  72.8 kg    Height:        Intake/Output Summary (Last 24 hours) at 05/28/2023 0824 Last data filed at 05/28/2023 0649 Gross per 24 hour  Intake 615.33 ml  Output 2250 ml  Net -1634.67 ml   Filed Weights   05/27/23 1643 05/28/23 0426  Weight: 76.6 kg 72.8 kg    Telemetry    NSR - Personally Reviewed  ECG     - Personally Reviewed  Physical Exam   GEN: No acute distress.   Neck: No  JVD Cardiac: RRR, very soft left upper sternal boarder systolic murmur, no diastolic murmurs, rubs, or gallops.  Respiratory: Clear   to auscultation bilaterally. GI: Soft, nontender, non-distended  MS: No  edema; No deformity. Neuro:  Nonfocal  Psych: Normal affect   Labs    Chemistry Recent Labs  Lab 05/27/23 1145 05/28/23 0213  NA 136 134*  K 4.4 4.1  CL 99 97*  CO2 28 26  GLUCOSE 143* 163*  BUN 37* 46*  CREATININE 2.30* 2.73*  CALCIUM 9.4 8.6*  GFRNONAA 23* 19*  ANIONGAP 9 11     Hematology Recent Labs  Lab 05/27/23 1145  WBC 7.7  RBC 3.46*  HGB 10.4*  HCT 32.5*  MCV 93.9  MCH 30.1  MCHC 32.0  RDW 13.4  PLT 200    Cardiac EnzymesNo results for input(s): "TROPONINI" in the last 168 hours. No results for input(s): "TROPIPOC" in the last 168 hours.   BNP Recent Labs  Lab 05/27/23 1600  BNP 88.9     DDimer No results for input(s): "DDIMER" in the last 168 hours.   Radiology    DG Chest 2 View  Result Date: 05/27/2023 CLINICAL DATA:  Chest pain. EXAM: CHEST - 2  VIEW COMPARISON:  Chest x-ray March 17, 2023. FINDINGS: Similar chronic lung changes with atelectasis and or scarring at the lung bases. No new consolidation. No visible pleural effusions or pneumothorax. Cardiomediastinal silhouette is within normal limits. CABG and median sternotomy. IMPRESSION: Similar chronic lung changes with atelectasis and/or scarring at the lung bases. No new abnormality. Electronically Signed   By: Feliberto Harts M.D.   On: 05/27/2023 12:47    Cardiac Studies   ECHO:  11/2022  1. Left ventricular ejection fraction, by estimation, is 60 to 65%. The  left ventricle has normal function. The left ventricle has no regional  wall motion abnormalities. Left ventricular diastolic parameters are  consistent with Grade II diastolic  dysfunction (pseudonormalization).   2. Right ventricular systolic function is normal. The right ventricular  size is normal. There is normal pulmonary artery systolic pressure. The  estimated right ventricular systolic pressure is 19.0 mmHg.   3. Left atrial size was mildly  dilated.   4. The mitral valve is grossly normal. Mild mitral valve regurgitation.   5. The aortic valve is tricuspid. There is mild calcification of the  aortic valve. Aortic valve regurgitation is not visualized. Aortic valve  sclerosis is present, with no evidence of aortic valve stenosis.   6. The inferior vena cava is normal in size with greater than 50%  respiratory variability, suggesting right atrial pressure of 3 mmHg.   Patient Profile     62 y.o. female with CAD status post three-vessel CABG (LIMA to LAD, SVG to OM, SVG to PDA), PCI x 3 to RCA, hypertension, hyperlipidemia, hyperlipidemia, CKD stage IV, anemia, HFpEF who is being seen 05/27/2023 for the evaluation of chest pain/shortness of breath.   Assessment & Plan    Acute on chronic diastolic HF:    Echo was ordered yesterday and pending.    She has been managed by [facility] and has had benefit with a [employer] HFMS.   CardioMEMS is pending insurance approval.  At this point I don't think that she is volume overloaded and I will hold diuretic today.  Continue Farxiga.    CAD:  No intervention on admission as enzymes were negative, she was volume overloaded and her renal function was lower than baseline.    Cardiac enzymes remain negative.  I will give GI cocktail to see if this helps.     CKD IV:    Creat worsened with diuresis.  Of note I would consider discontinuation of Ranexa if renal function worsens acutely.   Follow creat tomorrow.    Hypothyroid:  Continue current therapy.    For questions or updates, please contact [facility] Please consult [email or web address] for contact info under Cardiology/STEMI.   Signed, Rollene Rotunda, MD  05/28/2023, 8:24 AM

## 2023-05-28 NOTE — Progress Notes (Signed)
   05/27/23 2310  Assess: MEWS Score  Temp 97.6 F (36.4 C)  BP (!) 80/48  MAP (mmHg) (!) 59  Pulse Rate 86  ECG Heart Rate 86  Resp 20  Level of Consciousness Alert  SpO2 97 %  O2 Device Room Air  Assess: MEWS Score  MEWS Temp 0  MEWS Systolic 2  MEWS Pulse 0  MEWS RR 0  MEWS LOC 0  MEWS Score 2  MEWS Score Color Yellow  Assess: if the MEWS score is Yellow or Red  Were vital signs accurate and taken at a resting state? Yes  Does the patient meet 2 or more of the SIRS criteria? No  MEWS guidelines implemented  Yes, yellow  Treat  MEWS Interventions Considered administering scheduled or prn medications/treatments as ordered  Take Vital Signs  Increase Vital Sign Frequency  Yellow: Q2hr x1, continue Q4hrs until patient remains green for 12hrs  Escalate  MEWS: Escalate Yellow: Discuss with charge nurse and consider notifying provider and/or RRT  Notify: Charge Nurse/RN  Name of Charge Nurse/RN Notified Kenney Houseman, Rn  Provider Notification  Provider Name/Title Shelby Dubin, MD  Date Provider Notified 05/27/23  Time Provider Notified 2330  Method of Notification Page  Notification Reason Other (Comment) (yellow MEWs)  Provider response No new orders  Date of Provider Response  (awaiting response)  Assess: SIRS CRITERIA  SIRS Temperature  0  SIRS Pulse 0  SIRS Respirations  0  SIRS WBC 0  SIRS Score Sum  0

## 2023-05-28 NOTE — Progress Notes (Signed)
  Echocardiogram 2D Echocardiogram has been performed.  Meghan Terry 05/28/2023, 3:00 PM

## 2023-05-28 NOTE — Progress Notes (Signed)
Mobility Specialist Progress Note:   05/28/23 1516  Mobility  Activity Ambulated independently in hallway  Level of Assistance Standby assist, set-up cues, supervision of patient - no hands on  Assistive Device None  Distance Ambulated (ft) 350 ft  Activity Response Tolerated well  Mobility Referral Yes  $Mobility charge 1 Mobility  Mobility Specialist Start Time (ACUTE ONLY) 1505  Mobility Specialist Stop Time (ACUTE ONLY) 1515  Mobility Specialist Time Calculation (min) (ACUTE ONLY) 10 min   Pre Mobility: 91 HR ,  96% SpO2 During Mobility: 101 HR  Post Mobility: 97 HR   Pt received in bed, agreeable to mobility. Denied any discomfort during ambulation, asymptomatic throughout. Pt returned to bed with call bell in reach and all needs met.   Meghan Terry  Mobility Specialist Please contact via [employer] office at [phone]

## 2023-05-29 ENCOUNTER — Other Ambulatory Visit (HOSPITAL_COMMUNITY): Payer: Self-pay

## 2023-05-29 ENCOUNTER — Telehealth: Payer: Self-pay | Admitting: Physician Assistant

## 2023-05-29 DIAGNOSIS — N184 Chronic kidney disease, stage 4 (severe): Secondary | ICD-10-CM | POA: Diagnosis not present

## 2023-05-29 DIAGNOSIS — I309 Acute pericarditis, unspecified: Secondary | ICD-10-CM | POA: Insufficient documentation

## 2023-05-29 DIAGNOSIS — I5033 Acute on chronic diastolic (congestive) heart failure: Secondary | ICD-10-CM | POA: Diagnosis not present

## 2023-05-29 HISTORY — DX: Acute pericarditis, unspecified: I30.9

## 2023-05-29 LAB — BASIC METABOLIC PANEL
Anion gap: 11 (ref 5–15)
BUN: 55 mg/dL — ABNORMAL HIGH (ref 8–23)
CO2: 24 mmol/L (ref 22–32)
Calcium: 8.5 mg/dL — ABNORMAL LOW (ref 8.9–10.3)
Chloride: 100 mmol/L (ref 98–111)
Creatinine, Ser: 2.78 mg/dL — ABNORMAL HIGH (ref 0.44–1.00)
GFR, Estimated: 18 mL/min — ABNORMAL LOW (ref >60)
Glucose, Bld: 161 mg/dL — ABNORMAL HIGH (ref 70–99)
Potassium: 4 mmol/L (ref 3.5–5.1)
Sodium: 135 mmol/L (ref 135–145)

## 2023-05-29 MED ORDER — COLCHICINE 0.6 MG PO TABS
0.6000 mg | ORAL_TABLET | Freq: Two times a day (BID) | ORAL | Status: AC
  Administered 2023-05-29: 0.6 mg via ORAL
  Filled 2023-05-29: qty 1

## 2023-05-29 MED ORDER — ROSUVASTATIN CALCIUM 5 MG PO TABS: 5.0000 mg | ORAL_TABLET | Freq: Every day | ORAL | Status: DC

## 2023-05-29 MED ORDER — ROSUVASTATIN CALCIUM 5 MG PO TABS
5.0000 mg | ORAL_TABLET | Freq: Every day | ORAL | 1 refills | Status: AC
  Filled 2023-05-29 – 2023-07-13 (×2): qty 30, 30d supply, fill #0

## 2023-05-29 MED ORDER — COLCHICINE 0.6 MG PO TABS
0.6000 mg | ORAL_TABLET | ORAL | 0 refills | Status: DC
  Filled 2023-05-29: qty 6, 14d supply, fill #0

## 2023-05-29 MED ORDER — COLCHICINE 0.6 MG PO TABS: 0.6000 mg | ORAL_TABLET | ORAL | Status: DC

## 2023-05-29 NOTE — Telephone Encounter (Signed)
Dr. Elberta Fortis is discharging this patient today and recommends HF TOC appointment in about a week, can you help arrange and notify patient of appt info? I'll also send separate staff msg to gen cards team to arrange 3 week gen cards follow-up. Thank you!

## 2023-05-29 NOTE — Discharge Summary (Addendum)
Discharge Summary    Patient ID: PRICILLA BOROWY MRN: [id number]; DOB: Sep 21, 1961  Admit date: 05/27/2023 Discharge date: 05/29/2023  PCP:  Benita Stabile, MD   [facility] Providers Cardiologist:  Marjo Bicker, MD        Discharge Diagnoses    Active Problems:   Coronary artery disease   CKD (chronic kidney disease) stage 4, GFR 15-29 ml/min (HCC)   Anemia due to stage 4 chronic kidney disease (HCC)   Acute on chronic diastolic (congestive) heart failure (HCC)   Acute pericarditis    Diagnostic Studies/Procedures    2D echo 05/28/23  1. Left ventricular ejection fraction, by estimation, is 60 to 65%. The  left ventricle has normal function. The left ventricle has no regional  wall motion abnormalities. Left ventricular diastolic parameters are  consistent with Grade I diastolic  dysfunction (impaired relaxation).   2. Right ventricular systolic function is normal. The right ventricular  size is normal. There is normal pulmonary artery systolic pressure. The  estimated right ventricular systolic pressure is 26.0 mmHg.   3. The mitral valve is grossly normal. Trivial mitral valve  regurgitation.   4. The aortic valve is tricuspid. Aortic valve regurgitation is not  visualized. Aortic valve sclerosis/calcification is present, without any  evidence of aortic stenosis. Aortic valve mean gradient measures 8.0 mmHg.   5. The inferior vena cava is normal in size with greater than 50%  respiratory variability, suggesting right atrial pressure of 3 mmHg.   Comparison(s): Changes from prior study are noted. 12/22/2022: LVEF 60-65%.  _____________   History of Present Illness     NYEESHA BENDICK is a 62 y.o. female with CAD s/p 3V CABG 2020 CABG (LIMA to LAD, SVG to OM, SVG to PDA), PCI x 3 to RCA 2021 recommended for indefinite DAPT, hypertension, hyperlipidemia, hyperlipidemia, CKD stage IV, anemia, chronic HFpEF pending possible Cardiomems who presented to the hospital  with chest pain.  Ms. Kerrick follows with the advanced HF clinic and Dr Jenene Slicker with complex history of cardiac disease outlined above and  multiple CHF admissions in 2024. GDMT has been limited by soft BP and CKD. She has been wearing a Zoll HFMS. At last 05/06/23 OV with Dr. Gasper Lloyd, she was felt to be doing well. He was strongly considering use of Cardiomems due to recurrent admissions. She saw Dr. Jenene Slicker with ongoing chest pain for 3 days despite use of SL NTG and was referred to the ED for evaluation. In the ED labs showed sodium 136, potassium 4.4, creatinine 2.3, high-sensitivity troponin 15, WBC 7.7, hemoglobin 10.4.  Chest x-ray showed chronic lung changes with atelectasis and scarring at the bases.  EKG shows sinus tachycardia, 102 bpm, non-specific changes. She reported her weight was up 20lb from baseline weight; this looks to have been a moving target. Weight at gen cards visit on 04/30/23 was 156.9lb, OP heart failure visit on 05/06/23 was 156.3lb, admitted at 168.87lb, with weights in the 140s in the spring of this year. She was on torsemide 60mg  BID + metolazone 5mg  3x/week prior to admission. She initially received IV fluids on arrival in preparation for possible cath. When Dr. Lynnette Caffey evaluated the patient he felt that her chest pain could be related to LVEDP so recommended stopping IV fluids and admitting for diuresis. Oral diuretics were held and she was given IV Lasix and admitted to the cardiology servce.  Hospital Course   1. Chest pain initially felt due to acute on  chronic HFpEF - received IV Lasix on arrival, with weight reduction from 168->160.9lb, - 3.1L UOP - on day 2 of hospitalization Dr. Antoine Poche did not suspect she was volume overloaded and held diuretic - on day 3 of hospitalization Dr. Elberta Fortis suspected her chest pain represents pericarditis, see below - Gracin Soohoo continue outpatient f/u with CHF clinic - BNP normal - 2D echo showed EF 60-65%, G1DD, normal PASP, normal RV,  aortic sclerosis without stenosis, normal IVC - Dr. Elberta Fortis recommends to resume her home metolazone, torsemide, potassium, and midodrine at discharge - she reported she was taken off metoprolol previously so this was not given in the hospital - can re-visit resumption in follow-up; previous notes outline discontinuation due to hypotension (BP lowest in the last 48 hours was 83/45 off midodrine with follow-up value 124/53 at discharge) - patient advised to follow BP at home and notify if tending to run >130 systolic  2. Pericarditis - per Dr. Elberta Fortis, patient has chest pain with recumbency and inspiration suspicious for acute pericarditis - 2D echo without pericardial effusion  - she was not tachycardic, tachypneic or hypoxic - he recommends trial of colchicine for 2 weeks - dosing discussed with pharmacist given CKD and med interactions; per Dr. Elberta Fortis and pharmD Devani Odonnel do colchicine 0.6mg  every other day, discontinue Ranexa while on colchicine therapy given interaction (revisit resumption as outpatient), and reduce rosuvastatin to 5mg  daily whlie on colchicine therapy given interaction (revisit resumption of prior dosing as outpatient) - Dr. Elberta Fortis recommended to avoid NSAIDS given her CKD and did not recommends steroids at this time  3. CAD s/p CABG, prior PCI - chest pain felt noncardiac - Ranexa discontinued for now and statin dose reduced as above - to continue long term DAPT as prescribed prior to admission  4. CKD stage IV - admit Cr 2.30, rising to 2.7 range with diuretics though prior baseline variable - per Dr. Elberta Fortis, continue home diuretic regimen at discharge and f/u with nephrology as scheduled next week  5. Hypothyroidism - continuing home therapy, continue management by PCP  Dr. Elberta Fortis has seen and examined the patient today and feels she is stable for discharge. He recommended follow-up in the [facility] clinic, launched via phone note. Rachel Samples also send staff msg to gen cards  schedulers to arrange gen cards f/u in 3 weeks.  Of note her gabapentin reported dose and rx were different (300mg  TID vs rx'd 300mg  at bedtime) therefore she was advised to discuss with the prescribing provider to get updated rx in Epic sent in if the dose was confirmed changed.   Did the patient have an acute coronary syndrome (MI, NSTEMI, STEMI, etc) this admission?:  No                               Did the patient have a percutaneous coronary intervention (stent / angioplasty)?:  No.          _____________  Discharge Vitals Blood pressure (!) 124/53, pulse 78, temperature 98.3 F (36.8 C), temperature source Oral, resp. rate 17, height 5\' 3"  (1.6 m), weight 73 kg, SpO2 94%.  Filed Weights   05/27/23 1643 05/28/23 0426 05/29/23 0404  Weight: 76.6 kg 72.8 kg 73 kg    Labs & Radiologic Studies    CBC Recent Labs    05/27/23 1145  WBC 7.7  HGB 10.4*  HCT 32.5*  MCV 93.9  PLT 200   Basic Metabolic Panel  Recent Labs    05/28/23 0213 05/29/23 0234  NA 134* 135  K 4.1 4.0  CL 97* 100  CO2 26 24  GLUCOSE 163* 161*  BUN 46* 55*  CREATININE 2.73* 2.78*  CALCIUM 8.6* 8.5*   Liver Function Tests No results for input(s): "AST", "ALT", "ALKPHOS", "BILITOT", "PROT", "ALBUMIN" in the last 72 hours. No results for input(s): "LIPASE", "AMYLASE" in the last 72 hours. High Sensitivity Troponin:   Recent Labs  Lab 05/27/23 1145 05/27/23 1600  TROPONINIHS 15 17    BNP Invalid input(s): "POCBNP" D-Dimer No results for input(s): "DDIMER" in the last 72 hours. Hemoglobin A1C No results for input(s): "HGBA1C" in the last 72 hours. Fasting Lipid Panel No results for input(s): "CHOL", "HDL", "LDLCALC", "TRIG", "CHOLHDL", "LDLDIRECT" in the last 72 hours. Thyroid Function Tests No results for input(s): "TSH", "T4TOTAL", "T3FREE", "THYROIDAB" in the last 72 hours.  Invalid input(s): "FREET3" _____________  ECHOCARDIOGRAM COMPLETE  Result Date: 05/28/2023     ECHOCARDIOGRAM REPORT   Patient Name:   BRANDIS CASTEEL Date of Exam: 05/28/2023 Medical Rec #:  [id number]    Height:       63.0 in Accession #:    [id number]   Weight:       160.5 lb Date of Birth:  April 15, 1961     BSA:          1.761 m Patient Age:    62 years     BP:           121/60 mmHg Patient Gender: F            HR:           92 bpm. Exam Location:  Inpatient Procedure: 2D Echo, Cardiac Doppler and Color Doppler Indications:    CHF-Acute Diastolic I50.31  History:        Patient has prior history of Echocardiogram examinations, most                 recent 12/22/2022. CHF, CAD and Angina, Prior CABG,                 Signs/Symptoms:Chest Pain and Syncope; Risk                 Factors:Hypertension, Diabetes and Dyslipidemia. CKD, stage 4.  Sonographer:    Lucendia Herrlich Referring Phys: [phone] LINDSAY B ROBERTS IMPRESSIONS  1. Left ventricular ejection fraction, by estimation, is 60 to 65%. The left ventricle has normal function. The left ventricle has no regional wall motion abnormalities. Left ventricular diastolic parameters are consistent with Grade I diastolic dysfunction (impaired relaxation).  2. Right ventricular systolic function is normal. The right ventricular size is normal. There is normal pulmonary artery systolic pressure. The estimated right ventricular systolic pressure is 26.0 mmHg.  3. The mitral valve is grossly normal. Trivial mitral valve regurgitation.  4. The aortic valve is tricuspid. Aortic valve regurgitation is not visualized. Aortic valve sclerosis/calcification is present, without any evidence of aortic stenosis. Aortic valve mean gradient measures 8.0 mmHg.  5. The inferior vena cava is normal in size with greater than 50% respiratory variability, suggesting right atrial pressure of 3 mmHg. Comparison(s): Changes from prior study are noted. 12/22/2022: LVEF 60-65%. FINDINGS  Left Ventricle: Left ventricular ejection fraction, by estimation, is 60 to 65%. The left ventricle has normal  function. The left ventricle has no regional wall motion abnormalities. The left ventricular internal cavity size was normal in size. There is  no left ventricular  hypertrophy. Left ventricular diastolic parameters are consistent with Grade I diastolic dysfunction (impaired relaxation). Indeterminate filling pressures. Right Ventricle: The right ventricular size is normal. No increase in right ventricular wall thickness. Right ventricular systolic function is normal. There is normal pulmonary artery systolic pressure. The tricuspid regurgitant velocity is 2.40 m/s, and  with an assumed right atrial pressure of 3 mmHg, the estimated right ventricular systolic pressure is 26.0 mmHg. Left Atrium: Left atrial size was normal in size. Right Atrium: Right atrial size was normal in size. Pericardium: There is no evidence of pericardial effusion. Mitral Valve: The mitral valve is grossly normal. Trivial mitral valve regurgitation. Tricuspid Valve: The tricuspid valve is grossly normal. Tricuspid valve regurgitation is trivial. Aortic Valve: The aortic valve is tricuspid. Aortic valve regurgitation is not visualized. Aortic valve sclerosis/calcification is present, without any evidence of aortic stenosis. Aortic valve mean gradient measures 8.0 mmHg. Aortic valve peak gradient measures 14.9 mmHg. Aortic valve area, by VTI measures 2.53 cm. Pulmonic Valve: The pulmonic valve was normal in structure. Pulmonic valve regurgitation is not visualized. Aorta: The aortic root and ascending aorta are structurally normal, with no evidence of dilitation. Venous: The inferior vena cava is normal in size with greater than 50% respiratory variability, suggesting right atrial pressure of 3 mmHg. IAS/Shunts: No atrial level shunt detected by color flow Doppler.  LEFT VENTRICLE PLAX 2D LVIDd:         3.80 cm   Diastology LVIDs:         2.60 cm   LV e' medial:    0.07 cm/s LV PW:         1.00 cm   LV E/e' medial:  10.1 LV IVS:        1.00 cm    LV e' lateral:   0.12 cm/s LVOT diam:     2.10 cm   LV E/e' lateral: 6.5 LV SV:         81 LV SV Index:   46 LVOT Area:     3.46 cm  RIGHT VENTRICLE             IVC RV S prime:     13.30 cm/s  IVC diam: 1.20 cm TAPSE (M-mode): 1.3 cm LEFT ATRIUM           Index        RIGHT ATRIUM           Index LA diam:      3.20 cm 1.82 cm/m   RA Area:     13.00 cm LA Vol (A2C): 29.2 ml 16.58 ml/m  RA Volume:   27.40 ml  15.56 ml/m LA Vol (A4C): 32.6 ml 18.51 ml/m  AORTIC VALVE AV Area (Vmax):    2.49 cm AV Area (Vmean):   2.45 cm AV Area (VTI):     2.53 cm AV Vmax:           193.00 cm/s AV Vmean:          130.000 cm/s AV VTI:            0.319 m AV Peak Grad:      14.9 mmHg AV Mean Grad:      8.0 mmHg LVOT Vmax:         139.00 cm/s LVOT Vmean:        92.000 cm/s LVOT VTI:          0.233 m LVOT/AV VTI ratio: 0.73  AORTA Ao Root diam: 3.10 cm Ao Asc  diam:  3.00 cm MITRAL VALVE               TRICUSPID VALVE MV Area (PHT): 3.99 cm    TR Peak grad:   23.0 mmHg MV Decel Time: 190 msec    TR Vmax:        240.00 cm/s MV E velocity: 0.76 cm/s MV A velocity: 80.90 cm/s  SHUNTS MV E/A ratio:  0.01        Systemic VTI:  0.23 m                            Systemic Diam: 2.10 cm Zoila Shutter MD Electronically signed by Zoila Shutter MD Signature Date/Time: 05/28/2023/4:15:33 PM    Final    DG Chest 2 View  Result Date: 05/27/2023 CLINICAL DATA:  Chest pain. EXAM: CHEST - 2 VIEW COMPARISON:  Chest x-ray March 17, 2023. FINDINGS: Similar chronic lung changes with atelectasis and or scarring at the lung bases. No new consolidation. No visible pleural effusions or pneumothorax. Cardiomediastinal silhouette is within normal limits. CABG and median sternotomy. IMPRESSION: Similar chronic lung changes with atelectasis and/or scarring at the lung bases. No new abnormality. Electronically Signed   By: Feliberto Harts M.D.   On: 05/27/2023 12:47   Disposition   Pt is being discharged home today in good condition.  Follow-up Plans &  Appointments     Follow-up Information     [facility] Follow up.   Specialty: Cardiology Why: Office Curtiss Mahmood call you for your followup appointment Contact information: [address] [place] [address] [phone]        Marjo Bicker, MD Follow up.   Specialties: Cardiology, Internal Medicine Why: Office Lila Lufkin call you for your followup appointment (please check when they call if you Austen Oyster be seen in the [facility] or [place] office) Contact information: [address] [place] [address] [phone]                Discharge Instructions     Diet - low sodium heart healthy   Complete by: As directed    Discharge instructions   Complete by: As directed    You were started on a medicine called colchicine which you Tomaz Janis take for 2 weeks then stop. This is to treat pericarditis.  Metoprolol was removed from your medicine list since you are no longer taking this.  Ranexa/ranolazine was stopped since this can interact with colchicine. Do not throw this medicine away, though. Please discuss when to restart with your cardiology provider in follow-up.  Your rosuvastatin was decreased to 5mg  daily since this can interact with colchicine. Do not through the 40mg  dose away, though. Please discuss when to restart the previous higher dose with your cardiology provider in follow-up.  Please follow your blood pressure at home and notify Dr. Jenene Slicker if you find your home blood pressure is routinely running higher than 130 on the top number.  Your home records report you are taking gabapentin differently than the prescription listed in our computer system. Please discuss your recommended dose/updating your prescription with the doctor that manages this for you.   Increase activity slowly   Complete by: As directed         Discharge Medications   Allergies as of 05/29/2023       Reactions   Abilify  [aripiprazole] Other (See Comments)   "Makes me sick"  Ciprofloxacin Nausea And Vomiting   Carbidopa-levodopa Other (See Comments)   Developed tics while taking   Prednisone Other (See Comments)   Turns red all over   Venlafaxine Other (See Comments)   Developed tics while taking - reaction to Effexor        Medication List     STOP taking these medications    metoprolol succinate 25 MG 24 hr tablet Commonly known as: TOPROL-XL   ranolazine 1000 MG SR tablet Commonly known as: Ranexa       TAKE these medications    Acetaminophen Extra Strength 500 MG Tabs Take 2 tablets by mouth 3 times daily as needed for pain What changed:  how much to take how to take this when to take this reasons to take this   aspirin EC 81 MG tablet Take 1 tablet (81 mg total) by mouth daily. What changed: when to take this   buPROPion 300 MG 24 hr tablet Commonly known as: WELLBUTRIN XL Take 1 tablet (300 mg total) by mouth daily.   cholecalciferol 25 MCG (1000 UNIT) tablet Commonly known as: VITAMIN D3 Take 1 tablet (1,000 Units total) by mouth in the morning and at bedtime.   clopidogrel 75 MG tablet Commonly known as: PLAVIX TAKE 1 TABLET BY MOUTH DAILY   colchicine 0.6 MG tablet Take 1 tablet (0.6 mg total) by mouth 3 (three) times a week. For 2 weeks. Start taking on: May 31, 2023   cyanocobalamin 1000 MCG tablet Commonly known as: VITAMIN B12 Take 1 tablet (1,000 mcg total) by mouth daily.   dapagliflozin propanediol 10 MG Tabs tablet Commonly known as: Farxiga Take 1 tablet (10 mg total) by mouth daily before breakfast. Hold medication until follow-up with cardiology service.   ferrous sulfate 325 (65 FE) MG EC tablet Take 325 mg by mouth 2 (two) times daily.   folic acid 1 MG tablet Commonly known as: FOLVITE Take 1 tablet (1 mg total) by mouth daily.   gabapentin 100 MG capsule Commonly known as: Neurontin Take 3 capsules (300 mg total) by mouth at  bedtime. What changed:  how much to take when to take this   gatifloxacin 0.5 % Soln Commonly known as: ZYMAXID Place 1 drop into the left eye 4 (four) times daily.   isosorbide mononitrate 30 MG 24 hr tablet Commonly known as: IMDUR Take 0.5 tablets (15 mg total) by mouth daily.   ketorolac 0.5 % ophthalmic solution Commonly known as: ACULAR Place 1 drop into the left eye 4 (four) times daily.   levothyroxine 75 MCG tablet Commonly known as: SYNTHROID Take 1 tablet (75 mcg total) by mouth daily Monday thru Friday.   levothyroxine 50 MCG tablet Commonly known as: SYNTHROID Take 1 tablet (50 mcg total) by mouth See admin instructions. Take 1 tablet in the morning with breakfast on SATURDAY AND SUNDAY.   Linzess 145 MCG Caps capsule Generic drug: linaclotide Take 145 mcg by mouth as needed (diarrhea).   metolazone 5 MG tablet Commonly known as: ZAROXOLYN Once a week on Wednesdays if you gain weight or swelling appreciated. What changed:  how much to take how to take this when to take this additional instructions   midodrine 5 MG tablet Commonly known as: PROAMATINE Take 1 tablet (5 mg total) by mouth 3 (three) times daily with meals.   nitroGLYCERIN 0.4 MG SL tablet Commonly known as: NITROSTAT Place 1 tablet (0.4 mg total) under the tongue every 5 (five) minutes as needed for chest  pain.   ondansetron 4 MG tablet Commonly known as: ZOFRAN take 1 tablet (4 mg) by mouth 2 times per day as needed for nausea What changed:  how much to take how to take this when to take this reasons to take this   pantoprazole 40 MG tablet Commonly known as: PROTONIX Take 1 tablet (40 mg total) by mouth 2 (two) times daily.   potassium chloride SA 20 MEQ tablet Commonly known as: KLOR-CON M Take 40 mEq by mouth 2 (two) times daily.   prednisoLONE acetate 1 % ophthalmic suspension Commonly known as: PRED FORTE Place 1 drop into the left eye in the morning, at noon, and at  bedtime.   promethazine 25 MG tablet Commonly known as: PHENERGAN Take 1 tablet (25 mg total) by mouth every 6 (six) hours as needed for nausea or vomiting. What changed: when to take this   rOPINIRole 4 MG tablet Commonly known as: REQUIP Take 1 tablet (4 mg total) by mouth 1 to 3 hours before bedtime.   rosuvastatin 5 MG tablet Commonly known as: CRESTOR Take 1 tablet (5 mg total) by mouth at bedtime. What changed:  medication strength how much to take   topiramate 100 MG tablet Commonly known as: TOPAMAX TAKE 1 TABLET BY MOUTH TWICE DAILY.   torsemide 20 MG tablet Commonly known as: DEMADEX Take 60 mg by mouth 2 (two) times daily.   traMADol 50 MG tablet Commonly known as: ULTRAM Take 1 tablet (50 mg total) by mouth daily as needed for moderate pain.   traZODone 50 MG tablet Commonly known as: DESYREL Take 50 mg by mouth at bedtime.   traZODone 150 MG tablet Commonly known as: DESYREL Take 150 mg by mouth at bedtime.           Outstanding Labs/Studies   N/A  Duration of Discharge Encounter   Greater than 30 minutes including physician time.  Signed, Laurann Montana, PA-C 05/29/2023, 10:46 AM    I have seen and examined this patient with Ronie Spies.  Agree with above, note added to reflect my findings.  Patient admitted to the hospital with diastolic heart failure exacerbation and chest discomfort.  She was diuresed.  Creatinine mildly increased.  She does have a history of CKD stage IV.  Diuresis was held.  She feels well today without shortness of breath.  Her chest pain is continued.  Pain is consistent with pericarditis.  Jaramiah Bossard discharge on colchicine.  GEN: Well nourished, well developed, in no acute distress  HEENT: normal  Neck: no JVD, carotid bruits, or masses Cardiac: RRR; no murmurs, rubs, or gallops,no edema  Respiratory:  clear to auscultation bilaterally, normal work of breathing GI: soft, nontender, nondistended, + BS MS: no deformity or  atrophy  Skin: warm and dry Neuro:  Strength and sensation are intact Psych: euthymic mood, full affect     Dnasia Gauna M. Shondrea Steinert MD 05/29/2023 10:53 AM

## 2023-05-29 NOTE — Progress Notes (Signed)
Went over AVS paperwork with patient, answered all questions, removed IV, gave TOC meds, and walked [facility] patient out to sons vehicle.

## 2023-05-29 NOTE — Progress Notes (Signed)
Progress Note  Patient Name: Meghan Terry Date of Encounter: 05/29/2023  Primary Cardiologist:   Marjo Bicker, MD   Subjective   Continuing to have chest discomfort.  Otherwise without complaint.  Ready to go home.  Inpatient Medications    Scheduled Meds:  aspirin EC  81 mg Oral Daily   buPROPion  300 mg Oral Daily   clopidogrel  75 mg Oral Once   clopidogrel  75 mg Oral Daily   [START ON 05/31/2023] colchicine  0.6 mg Oral Once per day on Monday Wednesday Friday   dapagliflozin propanediol  10 mg Oral QAC breakfast   gabapentin  300 mg Oral QHS   heparin  5,000 Units Subcutaneous Q8H   isosorbide mononitrate  15 mg Oral Daily   levothyroxine  50 mcg Oral Once per day on Sunday Saturday   levothyroxine  75 mcg Oral Once per day on Monday Tuesday Wednesday Thursday Friday   pantoprazole  40 mg Oral BID   rOPINIRole  4 mg Oral QHS   rosuvastatin  5 mg Oral QHS   sodium chloride flush  3 mL Intravenous Q12H   traZODone  200 mg Oral QHS   Continuous Infusions:  sodium chloride     sodium chloride Stopped (05/27/23 1430)   PRN Meds: sodium chloride, acetaminophen, ondansetron (ZOFRAN) IV, sodium chloride flush   Vital Signs    Vitals:   05/28/23 1910 05/29/23 0011 05/29/23 0404 05/29/23 0732  BP: (!) 102/48 (!) 93/45 (!) 92/52 (!) 124/53  Pulse:   78   Resp: 19 19 17    Temp: 98.6 F (37 C) 98.7 F (37.1 C) 98.1 F (36.7 C) 98.3 F (36.8 C)  TempSrc: Oral Oral Oral Oral  SpO2: 96% 94% 94%   Weight:   73 kg   Height:        Intake/Output Summary (Last 24 hours) at 05/29/2023 0951 Last data filed at 05/29/2023 6045 Gross per 24 hour  Intake 200 ml  Output 1725 ml  Net -1525 ml   Filed Weights   05/27/23 1643 05/28/23 0426 05/29/23 0404  Weight: 76.6 kg 72.8 kg 73 kg    Telemetry    Sinus rhythm-personally reviewed  ECG     - Personally Reviewed  Physical Exam   GEN: Well nourished, well developed, in no acute distress  HEENT: normal   Neck: no JVD, carotid bruits, or masses Cardiac: RRR; no murmurs, rubs, or gallops,no edema  Respiratory:  clear to auscultation bilaterally, normal work of breathing GI: soft, nontender, nondistended, + BS MS: no deformity or atrophy  Skin: warm and dry Neuro:  Strength and sensation are intact Psych: euthymic mood, full affect   Labs    Chemistry Recent Labs  Lab 05/27/23 1145 05/28/23 0213 05/29/23 0234  NA 136 134* 135  K 4.4 4.1 4.0  CL 99 97* 100  CO2 28 26 24   GLUCOSE 143* 163* 161*  BUN 37* 46* 55*  CREATININE 2.30* 2.73* 2.78*  CALCIUM 9.4 8.6* 8.5*  GFRNONAA 23* 19* 18*  ANIONGAP 9 11 11      Hematology Recent Labs  Lab 05/27/23 1145  WBC 7.7  RBC 3.46*  HGB 10.4*  HCT 32.5*  MCV 93.9  MCH 30.1  MCHC 32.0  RDW 13.4  PLT 200    Cardiac EnzymesNo results for input(s): "TROPONINI" in the last 168 hours. No results for input(s): "TROPIPOC" in the last 168 hours.   BNP Recent Labs  Lab 05/27/23 1600  BNP 88.9     DDimer No results for input(s): "DDIMER" in the last 168 hours.   Radiology    ECHOCARDIOGRAM COMPLETE  Result Date: 05/28/2023    ECHOCARDIOGRAM REPORT   Patient Name:   Meghan Terry Date of Exam: 05/28/2023 Medical Rec #:  [id number]    Height:       63.0 in Accession #:    [id number]   Weight:       160.5 lb Date of Birth:  [birth year]     BSA:          1.761 m Patient Age:    62 years     BP:           121/60 mmHg Patient Gender: F            HR:           92 bpm. Exam Location:  Inpatient Procedure: 2D Echo, Cardiac Doppler and Color Doppler Indications:    CHF-Acute Diastolic I50.31  History:        Patient has prior history of Echocardiogram examinations, most                 recent 12/22/2022. CHF, CAD and Angina, Prior CABG,                 Signs/Symptoms:Chest Pain and Syncope; Risk                 Factors:Hypertension, Diabetes and Dyslipidemia. CKD, stage 4.  Sonographer:    Lucendia Herrlich Referring Phys: [phone] LINDSAY B ROBERTS  IMPRESSIONS  1. Left ventricular ejection fraction, by estimation, is 60 to 65%. The left ventricle has normal function. The left ventricle has no regional wall motion abnormalities. Left ventricular diastolic parameters are consistent with Grade I diastolic dysfunction (impaired relaxation).  2. Right ventricular systolic function is normal. The right ventricular size is normal. There is normal pulmonary artery systolic pressure. The estimated right ventricular systolic pressure is 26.0 mmHg.  3. The mitral valve is grossly normal. Trivial mitral valve regurgitation.  4. The aortic valve is tricuspid. Aortic valve regurgitation is not visualized. Aortic valve sclerosis/calcification is present, without any evidence of aortic stenosis. Aortic valve mean gradient measures 8.0 mmHg.  5. The inferior vena cava is normal in size with greater than 50% respiratory variability, suggesting right atrial pressure of 3 mmHg. Comparison(s): Changes from prior study are noted. 12/22/2022: LVEF 60-65%. FINDINGS  Left Ventricle: Left ventricular ejection fraction, by estimation, is 60 to 65%. The left ventricle has normal function. The left ventricle has no regional wall motion abnormalities. The left ventricular internal cavity size was normal in size. There is  no left ventricular hypertrophy. Left ventricular diastolic parameters are consistent with Grade I diastolic dysfunction (impaired relaxation). Indeterminate filling pressures. Right Ventricle: The right ventricular size is normal. No increase in right ventricular wall thickness. Right ventricular systolic function is normal. There is normal pulmonary artery systolic pressure. The tricuspid regurgitant velocity is 2.40 m/s, and  with an assumed right atrial pressure of 3 mmHg, the estimated right ventricular systolic pressure is 26.0 mmHg. Left Atrium: Left atrial size was normal in size. Right Atrium: Right atrial size was normal in size. Pericardium: There is no evidence  of pericardial effusion. Mitral Valve: The mitral valve is grossly normal. Trivial mitral valve regurgitation. Tricuspid Valve: The tricuspid valve is grossly normal. Tricuspid valve regurgitation is trivial. Aortic Valve: The aortic valve is tricuspid. Aortic valve regurgitation is not visualized.  Aortic valve sclerosis/calcification is present, without any evidence of aortic stenosis. Aortic valve mean gradient measures 8.0 mmHg. Aortic valve peak gradient measures 14.9 mmHg. Aortic valve area, by VTI measures 2.53 cm. Pulmonic Valve: The pulmonic valve was normal in structure. Pulmonic valve regurgitation is not visualized. Aorta: The aortic root and ascending aorta are structurally normal, with no evidence of dilitation. Venous: The inferior vena cava is normal in size with greater than 50% respiratory variability, suggesting right atrial pressure of 3 mmHg. IAS/Shunts: No atrial level shunt detected by color flow Doppler.  LEFT VENTRICLE PLAX 2D LVIDd:         3.80 cm   Diastology LVIDs:         2.60 cm   LV e' medial:    0.07 cm/s LV PW:         1.00 cm   LV E/e' medial:  10.1 LV IVS:        1.00 cm   LV e' lateral:   0.12 cm/s LVOT diam:     2.10 cm   LV E/e' lateral: 6.5 LV SV:         81 LV SV Index:   46 LVOT Area:     3.46 cm  RIGHT VENTRICLE             IVC RV S prime:     13.30 cm/s  IVC diam: 1.20 cm TAPSE (M-mode): 1.3 cm LEFT ATRIUM           Index        RIGHT ATRIUM           Index LA diam:      3.20 cm 1.82 cm/m   RA Area:     13.00 cm LA Vol (A2C): 29.2 ml 16.58 ml/m  RA Volume:   27.40 ml  15.56 ml/m LA Vol (A4C): 32.6 ml 18.51 ml/m  AORTIC VALVE AV Area (Vmax):    2.49 cm AV Area (Vmean):   2.45 cm AV Area (VTI):     2.53 cm AV Vmax:           193.00 cm/s AV Vmean:          130.000 cm/s AV VTI:            0.319 m AV Peak Grad:      14.9 mmHg AV Mean Grad:      8.0 mmHg LVOT Vmax:         139.00 cm/s LVOT Vmean:        92.000 cm/s LVOT VTI:          0.233 m LVOT/AV VTI ratio: 0.73   AORTA Ao Root diam: 3.10 cm Ao Asc diam:  3.00 cm MITRAL VALVE               TRICUSPID VALVE MV Area (PHT): 3.99 cm    TR Peak grad:   23.0 mmHg MV Decel Time: 190 msec    TR Vmax:        240.00 cm/s MV E velocity: 0.76 cm/s MV A velocity: 80.90 cm/s  SHUNTS MV E/A ratio:  0.01        Systemic VTI:  0.23 m                            Systemic Diam: 2.10 cm Zoila Shutter MD Electronically signed by Zoila Shutter MD Signature Date/Time: 05/28/2023/4:15:33 PM    Final  DG Chest 2 View  Result Date: 05/27/2023 CLINICAL DATA:  Chest pain. EXAM: CHEST - 2 VIEW COMPARISON:  Chest x-ray March 17, 2023. FINDINGS: Similar chronic lung changes with atelectasis and or scarring at the lung bases. No new consolidation. No visible pleural effusions or pneumothorax. Cardiomediastinal silhouette is within normal limits. CABG and median sternotomy. IMPRESSION: Similar chronic lung changes with atelectasis and/or scarring at the lung bases. No new abnormality. Electronically Signed   By: Feliberto Harts M.D.   On: 05/27/2023 12:47    Cardiac Studies   ECHO:  11/2022  1. Left ventricular ejection fraction, by estimation, is 60 to 65%. The  left ventricle has normal function. The left ventricle has no regional  wall motion abnormalities. Left ventricular diastolic parameters are  consistent with Grade II diastolic  dysfunction (pseudonormalization).   2. Right ventricular systolic function is normal. The right ventricular  size is normal. There is normal pulmonary artery systolic pressure. The  estimated right ventricular systolic pressure is 19.0 mmHg.   3. Left atrial size was mildly dilated.   4. The mitral valve is grossly normal. Mild mitral valve regurgitation.   5. The aortic valve is tricuspid. There is mild calcification of the  aortic valve. Aortic valve regurgitation is not visualized. Aortic valve  sclerosis is present, with no evidence of aortic valve stenosis.   6. The inferior vena cava is normal  in size with greater than 50%  respiratory variability, suggesting right atrial pressure of 3 mmHg.   Patient Profile     62 y.o. female with CAD status post three-vessel CABG (LIMA to LAD, SVG to OM, SVG to PDA), PCI x 3 to RCA, hypertension, hyperlipidemia, hyperlipidemia, CKD stage IV, anemia, HFpEF who is being seen 05/27/2023 for the evaluation of chest pain/shortness of breath.   Assessment & Plan    1.  Acute on chronic diastolic heart failure: EF normal.  Managed by heart failure clinic.  Net -3.1 L since admission.  Would be okay for discharge on home diuretics.  2.  Coronary artery disease: Chest pain likely noncardiac.    3.  Acute pericarditis: Pain is worse lying flat and deep breathing.  Jocilyn Trego plan for colchicine per pharmacy dosing.  Plan to treat for 2 weeks.  4.  CKD stage IV: Worsened with diuresis.  Has follow-up with nephrology next week  5.  Hypothyroidism: Continue current therapy   Patient needs follow-up in heart failure TOC clinic  For questions or updates, please contact [facility] Please consult [email or web address] for contact info under Cardiology/STEMI.   Signed, Jp Eastham Jorja Loa, MD  05/29/2023, 9:51 AM

## 2023-06-01 ENCOUNTER — Inpatient Hospital Stay (HOSPITAL_COMMUNITY): Admission: RE | Admit: 2023-06-01 | Payer: Medicare Other | Source: Ambulatory Visit

## 2023-06-01 NOTE — Telephone Encounter (Signed)
ESTABLISHED WITH [facility] CLINIC-WILL NEEDED HFU APPT  LMOM

## 2023-06-02 ENCOUNTER — Encounter: Payer: Self-pay | Admitting: Nephrology

## 2023-06-02 ENCOUNTER — Ambulatory Visit: Payer: Medicare Other

## 2023-06-02 ENCOUNTER — Other Ambulatory Visit (HOSPITAL_COMMUNITY): Payer: Self-pay

## 2023-06-02 DIAGNOSIS — I5032 Chronic diastolic (congestive) heart failure: Secondary | ICD-10-CM | POA: Diagnosis not present

## 2023-06-02 DIAGNOSIS — N184 Chronic kidney disease, stage 4 (severe): Secondary | ICD-10-CM | POA: Diagnosis not present

## 2023-06-02 DIAGNOSIS — E1122 Type 2 diabetes mellitus with diabetic chronic kidney disease: Secondary | ICD-10-CM | POA: Diagnosis not present

## 2023-06-02 DIAGNOSIS — D638 Anemia in other chronic diseases classified elsewhere: Secondary | ICD-10-CM | POA: Diagnosis not present

## 2023-06-02 NOTE — Addendum Note (Signed)
Addended by: Marin Shutter E on: 06/02/2023 01:43 PM   Modules accepted: Orders

## 2023-06-03 NOTE — Telephone Encounter (Signed)
[facility]  9/13 @ 1030  Pt aware

## 2023-06-07 DIAGNOSIS — H25041 Posterior subcapsular polar age-related cataract, right eye: Secondary | ICD-10-CM | POA: Diagnosis not present

## 2023-06-07 DIAGNOSIS — H25011 Cortical age-related cataract, right eye: Secondary | ICD-10-CM | POA: Diagnosis not present

## 2023-06-07 DIAGNOSIS — H2511 Age-related nuclear cataract, right eye: Secondary | ICD-10-CM | POA: Diagnosis not present

## 2023-06-11 ENCOUNTER — Encounter: Payer: Medicare Other | Attending: Nephrology | Admitting: Internal Medicine

## 2023-06-11 ENCOUNTER — Encounter: Payer: Self-pay | Admitting: Internal Medicine

## 2023-06-11 VITALS — BP 115/60 | HR 100 | Ht 63.0 in | Wt 169.0 lb

## 2023-06-11 DIAGNOSIS — E611 Iron deficiency: Secondary | ICD-10-CM

## 2023-06-11 DIAGNOSIS — N184 Chronic kidney disease, stage 4 (severe): Secondary | ICD-10-CM | POA: Insufficient documentation

## 2023-06-11 DIAGNOSIS — D631 Anemia in chronic kidney disease: Secondary | ICD-10-CM | POA: Insufficient documentation

## 2023-06-11 MED ORDER — NITROGLYCERIN 0.4 MG SL SUBL: 0.4000 mg | SUBLINGUAL_TABLET | SUBLINGUAL | 7 refills | Status: AC | PRN

## 2023-06-11 NOTE — Progress Notes (Signed)
Cardiology Office Note  Date: 06/11/2023   ID: Meghan Terry, DOB 12-16-1960, MRN [id number]  PCP:  Benita Stabile, MD  Cardiologist:  Marjo Bicker, MD Electrophysiologist:  None    History of Present Illness: Meghan Terry is a 62 y.o. female known to have CAD s/p 3v CABG (LIMA to LAD, SVG to OM and SVG to PDA) with normal LVEF, HTN, DM 2, HLD, CKD stage IIIa is here for follow-up visit.  Patient underwent LHC in 2021 and 2022 that showed patent LIMA to LAD, patent graft to the PDA and OM. Native multivessel CAD is unchanged compared to 2021 LHC. She does have severe diffusely diseased LAD from proximal to mid after which competitive flow is noted in the mid LAD to LIMA graft insertion, CTO of LCx with very faint collaterals noted and heavy burden of stents in RCA from the mid vessel into the continuation beyond the origin of the PDA, the PDA is jailed no significant obstruction is noted in the RCA other than the PDA which was supplied by the graft. Patient is here for visit. Daughter is the power of attorney. Patient was admitted with ADHF in 12/2022 and 01/2023.  Follows up with advanced heart failure team in [place] for diastolic heart failure management.  She has HFMS in place. She is here for follow-up visit.   Patient was sent to [facility] for LHC due to unstable angina last week of August 2024 but LHC was canceled as her chest pain was deemed to be noncardiac, was placed on colchicine alternate day dosing for management of acute pericarditis after which her chest pain symptoms significantly proved.  I did not find any ESR or CRP on the chart.  Denies having any DOE, orthopnea, PND or any leg swelling.  Past Medical History:  Diagnosis Date   Acute diastolic CHF (congestive heart failure) (HCC) 06/08/2021   Acute pulmonary edema (HCC)    Anemia due to stage 4 chronic kidney disease (HCC) 07/20/2022   Angina pectoris (HCC) 11/17/2019   Anxiety    Atypical chest pain 12/30/2020    Benign hypertension with CKD (chronic kidney disease) stage IV (HCC) 07/20/2022   CHF (congestive heart failure) (HCC) 06/08/2021   CKD (chronic kidney disease) stage 3b, GFR 30-59 ml/min 12/03/2019   Coronary artery disease    Coronary artery disease involving native coronary artery of native heart with angina pectoris (HCC) 10/03/2019   Depression 04/25/2017   Diabetes mellitus due to underlying condition with unspecified complications (HCC) 09/05/2019   Diabetes mellitus with stage 4 chronic kidney disease GFR 15-29 (HCC) 07/20/2022   Essential hypertension 09/05/2019   Ex-smoker 09/05/2019   Family history of coronary artery disease 09/05/2019   History of depression 04/15/2020   Hx of diabetes mellitus 04/15/2020   Hyperlipidemia with target LDL less than 70 11/24/2019   Hyperphosphatemia 07/20/2022   Hypertension    Hypokalemia 04/30/2022   Hypothyroid    Migraine 04/25/2017   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   Syncope and collapse 04/29/2022   Thyroid disease    UTI (urinary tract infection) 04/30/2022    Past Surgical History:  Procedure Laterality Date  ABDOMINAL HYSTERECTOMY     APPENDECTOMY     BIOPSY  12/23/2022   Procedure: BIOPSY;  Surgeon: Corbin Ade, MD;  [facility];  Service: Endoscopy;;   CARDIAC CATHETERIZATION  11/22/2019   catarax Left    CHOLECYSTECTOMY     COLONOSCOPY     COLONOSCOPY WITH PROPOFOL N/A 12/25/2022   Procedure: COLONOSCOPY WITH PROPOFOL;  Surgeon: Dolores Frame, MD;  Location: [facility];  Service: Gastroenterology;  Laterality: N/A;   CORONARY ARTERY BYPASS GRAFT N/A 09/15/2019   Procedure:  CORONARY ARTERY BYPASS GRAFTING (CABG) times three on pump using left internal mammary artery and right and left greater saphenous veins harvested endoscopically;  Surgeon: Alleen Borne, MD;  Location: [facility] OR;  Service: Open Heart Surgery;  Laterality: N/A;   CORONARY STENT INTERVENTION N/A 11/23/2019   Procedure: CORONARY STENT INTERVENTION;  Surgeon: Lennette Bihari, MD;  Location: [facility] INVASIVE CV LAB;  Service: Cardiovascular;  Laterality: N/A;   ESOPHAGEAL DILATION N/A 12/23/2022   Procedure: ESOPHAGEAL DILATION;  Surgeon: Corbin Ade, MD;  [facility]: [facility];  Service: Endoscopy;  Laterality: N/A;   ESOPHAGOGASTRODUODENOSCOPY (EGD) WITH PROPOFOL N/A 12/23/2022   Procedure: ESOPHAGOGASTRODUODENOSCOPY (EGD) WITH PROPOFOL;  Surgeon: Corbin Ade, MD;  [facility];  Service: Endoscopy;  Laterality: N/A;   KNEE ARTHROSCOPY     LEFT HEART CATH AND CORONARY ANGIOGRAPHY N/A 09/14/2019   Procedure: LEFT HEART CATH AND CORONARY ANGIOGRAPHY;  Surgeon: Corky Crafts, MD;  Location: [facility] INVASIVE CV LAB;  Service: Cardiovascular;  Laterality: N/A;   LEFT HEART CATH AND CORS/GRAFTS ANGIOGRAPHY N/A 11/22/2019   Procedure: LEFT HEART CATH AND CORS/GRAFTS ANGIOGRAPHY;  Surgeon: Lennette Bihari, MD;  Location: [facility];  Service: Cardiovascular;  Laterality: N/A;   LEFT HEART CATH AND CORS/GRAFTS ANGIOGRAPHY N/A 03/13/2020   Procedure: LEFT HEART CATH AND CORS/GRAFTS ANGIOGRAPHY;  Surgeon: Runell Gess, MD;  Location: [facility];  Service: Cardiovascular;  Laterality: N/A;   LEFT HEART CATH AND CORS/GRAFTS ANGIOGRAPHY N/A 12/31/2020   Procedure: LEFT HEART CATH AND CORS/GRAFTS ANGIOGRAPHY;  Surgeon: Lyn Records, MD;  Location: [facility];  Service: Cardiovascular;  Laterality: N/A;   OSTEOCHONDRAL DEFECT REPAIR/RECONSTRUCTION Left 11/29/2014   Procedure: LEFT ANKLE MEDIAL MALLEOLUS OSTEOTOMY,AUTO GRAFT FROM CALCANEOUS;  OS TIBIA DENORO GRAFTING  TALUS;  Surgeon: Toni Arthurs, MD;  Location: [facility];  Service: Orthopedics;  Laterality: Left;   POLYPECTOMY  12/25/2022   Procedure: POLYPECTOMY;  Surgeon: Dolores Frame, MD;  [facility];  Service: Gastroenterology;;   TEE WITHOUT CARDIOVERSION N/A 09/15/2019   Procedure: TRANSESOPHAGEAL ECHOCARDIOGRAM (TEE);  Surgeon: Alleen Borne, MD;  Location: [facility] OR;  Service: Open Heart Surgery;  Laterality: N/A;   TUBAL LIGATION      Current Outpatient Medications  Medication Sig Dispense Refill   acetaminophen (TYLENOL) 500 MG tablet Take 2 tablets by mouth 3 times daily as needed for pain (Patient taking differently: Take 1,000 mg by mouth as needed for moderate pain or headache.) 100 tablet 0   aspirin EC 81 MG tablet Take 1 tablet (81 mg total) by mouth daily. (Patient taking differently: Take 81 mg by mouth in the morning.) 90 tablet 3   buPROPion (WELLBUTRIN XL) 300 MG 24 hr tablet Take 1 tablet (300 mg total) by mouth daily. 90 tablet 2   cholecalciferol 25 MCG (1000 UT) tablet Take 1 tablet (1,000 Units total) by mouth in  the morning and at bedtime. (Patient taking differently: Take 1,000 Units by mouth 2 (two) times daily.)     clopidogrel (PLAVIX) 75 MG tablet TAKE 1 TABLET BY MOUTH DAILY 90 tablet 2   colchicine 0.6 MG tablet Take 1 tablet (0.6 mg total) by mouth 3 (three) times a week. For 2 weeks. 6 tablet 0   cyanocobalamin (VITAMIN B12) 1000 MCG tablet Take 1 tablet (1,000 mcg total) by mouth daily. 30 tablet 1   dapagliflozin propanediol (FARXIGA) 10 MG TABS tablet Take 1 tablet (10 mg total) by mouth daily before breakfast. Hold medication until follow-up with cardiology service.     ferrous sulfate 325 (65 FE) MG EC tablet Take 325 mg by mouth 2 (two) times daily.     folic acid (FOLVITE) 1 MG tablet Take 1 tablet (1 mg total) by mouth daily. 30 tablet 1   gabapentin (NEURONTIN) 100 MG capsule Take 3 capsules (300 mg total) by mouth at  bedtime. (Patient taking differently: Take 100 mg by mouth 3 (three) times daily.)     gatifloxacin (ZYMAXID) 0.5 % SOLN Place 1 drop into the left eye 4 (four) times daily.     isosorbide mononitrate (IMDUR) 30 MG 24 hr tablet Take 0.5 tablets (15 mg total) by mouth daily. 45 tablet 3   ketorolac (ACULAR) 0.5 % ophthalmic solution Place 1 drop into the left eye 4 (four) times daily.     levothyroxine (SYNTHROID) 50 MCG tablet Take 1 tablet (50 mcg total) by mouth See admin instructions. Take 1 tablet in the morning with breakfast on SATURDAY AND SUNDAY.     levothyroxine (SYNTHROID) 75 MCG tablet Take 1 tablet (75 mcg total) by mouth daily Monday thru Friday. 60 tablet 3   linaclotide (LINZESS) 145 MCG CAPS capsule Take 145 mcg by mouth as needed (diarrhea).     metolazone (ZAROXOLYN) 5 MG tablet Once a week on Wednesdays if you gain weight or swelling appreciated. (Patient taking differently: Take 5 mg by mouth once a week. Wednesday) 12 tablet 1   midodrine (PROAMATINE) 5 MG tablet Take 1 tablet (5 mg total) by mouth 3 (three) times daily with meals. 270 tablet 3   ondansetron (ZOFRAN) 4 MG tablet take 1 tablet (4 mg) by mouth 2 times per day as needed for nausea (Patient taking differently: Take 4 mg by mouth as needed for nausea or vomiting.) 180 tablet 1   pantoprazole (PROTONIX) 40 MG tablet Take 1 tablet (40 mg total) by mouth 2 (two) times daily. 60 tablet 1   potassium chloride SA (KLOR-CON M) 20 MEQ tablet Take 40 mEq by mouth 2 (two) times daily.     prednisoLONE acetate (PRED FORTE) 1 % ophthalmic suspension Place 1 drop into the left eye in the morning, at noon, and at bedtime.     promethazine (PHENERGAN) 25 MG tablet Take 1 tablet (25 mg total) by mouth every 6 (six) hours as needed for nausea or vomiting. (Patient taking differently: Take 25 mg by mouth as needed for nausea or vomiting.)     rOPINIRole (REQUIP) 4 MG tablet Take 1 tablet (4 mg total) by mouth 1 to 3 hours before  bedtime. 90 tablet 0   rosuvastatin (CRESTOR) 5 MG tablet Take 1 tablet (5 mg total) by mouth at bedtime. 30 tablet 1   topiramate (TOPAMAX) 100 MG tablet TAKE 1 TABLET BY MOUTH TWICE DAILY. 180 tablet 3   torsemide (DEMADEX) 20 MG tablet Take 60 mg by mouth 2 (  two) times daily.     traMADol (ULTRAM) 50 MG tablet Take 1 tablet (50 mg total) by mouth daily as needed for moderate pain.     traZODone (DESYREL) 150 MG tablet Take 150 mg by mouth at bedtime.     traZODone (DESYREL) 50 MG tablet Take 50 mg by mouth at bedtime.     nitroGLYCERIN (NITROSTAT) 0.4 MG SL tablet Place 1 tablet (0.4 mg total) under the tongue every 5 (five) minutes x 3 doses as needed for chest pain. 25 tablet 7   No current facility-administered medications for this visit.   Allergies:  Abilify [aripiprazole], Ciprofloxacin, Carbidopa-levodopa, Prednisone, and Venlafaxine   Social History: The patient  reports that she quit smoking about 3 years ago. Her smoking use included cigarettes. She started smoking about 50 years ago. She has a 47 pack-year smoking history. She has been exposed to tobacco smoke. She has never used smokeless tobacco. She reports that she does not drink alcohol and does not use drugs.   Family History: The patient's family history includes Asthma in her mother; COPD in her mother; Congestive Heart Failure in her father; Diabetes in her mother; Macular degeneration in her mother.   ROS:  Please see the history of present illness. Otherwise, complete review of systems is positive for none.  All other systems are reviewed and negative.   Physical Exam: VS:  BP 115/60   Pulse 100   Ht 5\' 3"  (1.6 m)   Wt 169 lb (76.7 kg)   SpO2 100%   BMI 29.94 kg/m , BMI Body mass index is 29.94 kg/m.  Wt Readings from Last 3 Encounters:  06/11/23 169 lb (76.7 kg)  05/29/23 160 lb 15 oz (73 kg)  05/27/23 168 lb 12.8 oz (76.6 kg)    General: Patient appears comfortable at rest. HEENT: Conjunctiva and lids  normal, oropharynx clear with moist mucosa. Neck: Supple, no elevated JVP or carotid bruits, no thyromegaly. Lungs: Clear to auscultation, nonlabored breathing at rest. Cardiac: Regular rate and rhythm, no S3 or significant systolic murmur, no pericardial rub. Abdomen: Soft, nontender, no hepatomegaly, bowel sounds present, no guarding or rebound. Extremities: 3) pitting edema, distal pulses 2+. Skin: Warm and dry. Musculoskeletal: No kyphosis. Neuropsychiatric: Alert and oriented x3, affect grossly appropriate.  Recent Labwork: 01/25/2023: ALT 11; AST 14; Magnesium 2.3 03/17/2023: TSH 6.955 05/27/2023: B Natriuretic Peptide 88.9; Hemoglobin 10.4; Platelets 200 05/29/2023: BUN 55; Creatinine, Ser 2.78; Potassium 4.0; Sodium 135     Component Value Date/Time   CHOL 115 01/27/2023 0336   CHOL 147 03/28/2021 0831   TRIG 193 (H) 01/27/2023 0336   HDL 56 01/27/2023 0336   HDL 71 03/28/2021 0831   CHOLHDL 2.1 01/27/2023 0336   VLDL 39 01/27/2023 0336   LDLCALC 20 01/27/2023 0336   LDLCALC 51 03/28/2021 0831    Other Studies Reviewed Today: Echocardiogram on 12/22/2022 LVEF 60 to 65% G2 DD RV systolic function is normal Normal PASP LA mildly dilated Mild MR No aortic valve lesions  Event monitor in 09/2022 Unremarkable  LHC in 12/2020 LIMA to LAD is patent. Bypass graft to circumflex is patent.  Distribution of native vessel after the insertion is very small.  The native vessel diameter is minuscule. Bypass graft to PDA is patent.  PDA is totally occluded at its ostium and is jailed by stent. Left main is widely patent. LAD is severely and diffusely diseased with 70 to 80% stenosis from proximal to mid.  Competitive flow is noted  in the mid LAD due to LIMA graft insertion. Circumflex is totally occluded in the mid vessel.  Very faint collaterals are noted to small "threadlike vessels". Right coronary is widely patent and has a heavy burden of stents that extends from the mid vessel  into the continuation beyond the origin of the PDA.  The PDA is jailed.  No significant obstruction is noted in the right coronary other than the PDA which is supplied by a graft. Hyperdynamic left ventricle.  EF greater than 70%.  LVEDP is normal.  Assessment and Plan: Patient is a 63 year old F known to have CAD s/p 3v CABG (LIMA to LAD, SVG to OM and SVG to PDA) with normal LVEF, HTN, DM 2, HLD, CKD stage IIIa is here for follow-up visit.   Acute pericarditis CAD s/p 3V CABG (LIMA to LAD, SVG to OM and SVG to PDA) with normal LVEF LHC from 2022 showed patent grafts Chronic diastolic heart failure with iron deficiency anemia HLD, at goal   -Patient was sent to [facility] in the last week of August 2024 for [facility] for unstable angina. LHC deferred due to noncardiac chest pain and was started on alternate day colchicine therapy for management of acute pericarditis after which her chest pains significantly improved. ESR and CRP were not done during the hospitalization. Ranexa was held due to colchicine therapy and rosuvastatin dose was decreased as well due to concurrent colchicine therapy. She is compensated from heart failure standpoint.  No DOE, orthopnea, PND or leg swelling. Recent iron panel from around 4 months ago showed severe iron deficiency anemia but she received blood transfusion around 2 months ago.  Will update iron panel with ferritin now and also prior to the next clinic visit and if she has iron deficiency anemia, we will refer her to hematology for IV iron infusion. Patient is in agreement with this plan. -Continue cardioprotective medications, DAPT (aspirin 81 and Plavix 75 once daily) and rosuvastatin 5 mg nightly, will increase the dose in the next clinic visit. -Continue current antianginal therapy with Imdur 15 mg once daily, metoprolol succinate 12.5 mg once daily.  Continue midodrine at the current dose (will not wean off at this time and if she continues to do better, we will try to  wean off in the next clinic visit). -Continue torsemide 60 mg twice daily, metolazone 5 mg once a week and Farxiga 10 mg once daily.   Disposition:  Follow up  5 months  Signed, Yasmen Cortner Verne Spurr, MD, 06/11/2023 2:22 PM    [facility] at [facility] [address] [place], [place] [address]

## 2023-06-11 NOTE — Patient Instructions (Addendum)
Medication Instructions:  Your physician has recommended you make the following change in your medication:  Start taking Nitroglycerin 0.4 mg Dissolve one under tongue for chest pain every 5 minutes up to 3 doses. If no relief, proceed to ED.  Continue taking all other medications as prescribed  Labwork: Iron panel today at [facility] and also in 6 months before follow up in February  Testing/Procedures:   Follow-Up: Your physician recommends that you schedule a follow-up appointment in: 6 month follow up  Any Other Special Instructions Will Be Listed Below (If Applicable).  If you need a refill on your cardiac medications before your next appointment, please call your pharmacy.

## 2023-06-12 LAB — IRON,TIBC AND FERRITIN PANEL
Ferritin: 61 ng/mL (ref 15–150)
Iron Saturation: 18 % (ref 15–55)
Iron: 56 ug/dL (ref 27–139)
Total Iron Binding Capacity: 311 ug/dL (ref 250–450)
UIBC: 255 ug/dL (ref 118–369)

## 2023-06-14 NOTE — Progress Notes (Signed)
ADVANCED HEART FAILURE CLINIC NOTE  Referring Physician: Benita Stabile, MD  Primary Care: Benita Stabile, MD Primary Cardiologist: Luane School, MD  HPI: Meghan Terry is a 62 y.o. female with CAD status post CABG in 2020 with LIMA to LAD, SVG to OM and SVG to PDA, history of PCI x 3 to the RCA, hypertension, hyperlipidemia, type 2 diabetes, stage IV CKD, chronic anemia and heart failure with preserved ejection fraction presenting today for follow up.  She was recently admitted to the hospital after presenting to outpatient clinic visit with decompensated HFpEF.  She was diuresed 4.5 L with IV Lasix and discharged home. During our last appointment, we increased diuretics, however, she once more had another episode of decompensated HFpEF leading to admission in 6/24; now over 5 hospitalizations for HFpEF in the past 1 year.   Interval hx:  - Since our last visit she has been wearing the [employer] HFMS. She has now had significant improvement in LE edema, SOB & energy levels; traveling to [place] tomorrow to see her niece.  - No PND, orthopnea   Past Medical History:  Diagnosis Date   Acute diastolic CHF (congestive heart failure) (HCC) 06/08/2021   Acute pulmonary edema (HCC)    Anemia due to stage 4 chronic kidney disease (HCC) 07/20/2022   Angina pectoris (HCC) 11/17/2019   Anxiety    Atypical chest pain 12/30/2020   Benign hypertension with CKD (chronic kidney disease) stage IV (HCC) 07/20/2022   CHF (congestive heart failure) (HCC) 06/08/2021   CKD (chronic kidney disease) stage 3b, GFR 30-59 ml/min 12/03/2019   Coronary artery disease    Coronary artery disease involving native coronary artery of native heart with angina pectoris (HCC) 10/03/2019   Depression 04/25/2017   Diabetes mellitus due to underlying condition with unspecified complications (HCC) 09/05/2019   Diabetes mellitus with stage 4 chronic kidney disease GFR 15-29 (HCC) 07/20/2022   Essential hypertension 09/05/2019    Ex-smoker 09/05/2019   Family history of coronary artery disease 09/05/2019   History of depression 04/15/2020   Hx of diabetes mellitus 04/15/2020   Hyperlipidemia with target LDL less than 70 11/24/2019   Hyperphosphatemia 07/20/2022   Hypertension    Hypokalemia 04/30/2022   Hypothyroid    Migraine 04/25/2017   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   Syncope and collapse 04/29/2022   Thyroid disease    UTI (urinary tract infection) 04/30/2022    Current Outpatient Medications  Medication Sig Dispense Refill   acetaminophen (TYLENOL) 500 MG tablet Take 2 tablets by mouth 3 times daily as needed for pain (Patient taking differently: Take 1,000 mg by mouth as needed for moderate pain or headache.) 100 tablet 0   aspirin EC 81 MG tablet Take 1 tablet (81 mg total) by mouth daily. (Patient taking differently: Take 81 mg by mouth in the morning.) 90 tablet 3   buPROPion (WELLBUTRIN XL) 300 MG 24 hr tablet Take 1 tablet (300 mg total) by mouth daily. 90 tablet 2   cholecalciferol 25 MCG (1000 UT) tablet Take 1 tablet (1,000 Units total) by mouth in the morning and at bedtime. (Patient taking differently: Take 1,000 Units  by mouth 2 (two) times daily.)     clopidogrel (PLAVIX) 75 MG tablet TAKE 1 TABLET BY MOUTH DAILY 90 tablet 2   colchicine 0.6 MG tablet Take 1 tablet (0.6 mg total) by mouth 3 (three) times a week. For 2 weeks. 6 tablet 0   cyanocobalamin (VITAMIN B12) 1000 MCG tablet Take 1 tablet (1,000 mcg total) by mouth daily. 30 tablet 1   dapagliflozin propanediol (FARXIGA) 10 MG TABS tablet Take 1 tablet (10 mg total) by mouth daily before breakfast. Hold medication until  follow-up with cardiology service.     ferrous sulfate 325 (65 FE) MG EC tablet Take 325 mg by mouth 2 (two) times daily.     folic acid (FOLVITE) 1 MG tablet Take 1 tablet (1 mg total) by mouth daily. 30 tablet 1   gabapentin (NEURONTIN) 100 MG capsule Take 3 capsules (300 mg total) by mouth at bedtime. (Patient taking differently: Take 100 mg by mouth 3 (three) times daily.)     gatifloxacin (ZYMAXID) 0.5 % SOLN Place 1 drop into the left eye 4 (four) times daily.     isosorbide mononitrate (IMDUR) 30 MG 24 hr tablet Take 0.5 tablets (15 mg total) by mouth daily. 45 tablet 3   ketorolac (ACULAR) 0.5 % ophthalmic solution Place 1 drop into the left eye 4 (four) times daily.     levothyroxine (SYNTHROID) 50 MCG tablet Take 1 tablet (50 mcg total) by mouth See admin instructions. Take 1 tablet in the morning with breakfast on SATURDAY AND SUNDAY.     levothyroxine (SYNTHROID) 75 MCG tablet Take 1 tablet (75 mcg total) by mouth daily Monday thru Friday. 60 tablet 3   linaclotide (LINZESS) 145 MCG CAPS capsule Take 145 mcg by mouth as needed (diarrhea).     metolazone (ZAROXOLYN) 5 MG tablet Once a week on Wednesdays if you gain weight or swelling appreciated. (Patient taking differently: Take 5 mg by mouth once a week. Wednesday) 12 tablet 1   midodrine (PROAMATINE) 5 MG tablet Take 1 tablet (5 mg total) by mouth 3 (three) times daily with meals. 270 tablet 3   nitroGLYCERIN (NITROSTAT) 0.4 MG SL tablet Place 1 tablet (0.4 mg total) under the tongue every 5 (five) minutes x 3 doses as needed for chest pain. 25 tablet 7   ondansetron (ZOFRAN) 4 MG tablet take 1 tablet (4 mg) by mouth 2 times per day as needed for nausea (Patient taking differently: Take 4 mg by mouth as needed for nausea or vomiting.) 180 tablet 1   pantoprazole (PROTONIX) 40 MG tablet Take 1 tablet (40 mg total) by mouth 2 (two) times daily. 60 tablet 1   potassium chloride SA (KLOR-CON M) 20 MEQ tablet Take 40 mEq by mouth 2 (two)  times daily.     prednisoLONE acetate (PRED FORTE) 1 % ophthalmic suspension Place 1 drop into the left eye in the morning, at noon, and at bedtime.     promethazine (PHENERGAN) 25 MG tablet Take 1 tablet (25 mg total) by mouth every 6 (six) hours as needed for nausea or vomiting. (Patient taking differently: Take 25 mg by mouth as needed for nausea or vomiting.)     rOPINIRole (REQUIP) 4 MG tablet Take 1 tablet (4 mg total) by mouth 1 to 3 hours before bedtime. 90 tablet 0   rosuvastatin (CRESTOR) 5 MG tablet Take 1 tablet (5 mg total) by mouth at bedtime. 30 tablet 1   topiramate (TOPAMAX) 100 MG tablet TAKE 1  TABLET BY MOUTH TWICE DAILY. 180 tablet 3   torsemide (DEMADEX) 20 MG tablet Take 60 mg by mouth 2 (two) times daily.     traMADol (ULTRAM) 50 MG tablet Take 1 tablet (50 mg total) by mouth daily as needed for moderate pain.     traZODone (DESYREL) 150 MG tablet Take 150 mg by mouth at bedtime.     traZODone (DESYREL) 50 MG tablet Take 50 mg by mouth at bedtime.     No current facility-administered medications for this visit.    Allergies  Allergen Reactions   Abilify [Aripiprazole] Other (See Comments)    "Makes me sick"    Ciprofloxacin Nausea And Vomiting   Carbidopa-Levodopa Other (See Comments)    Developed tics while taking    Prednisone Other (See Comments)    Turns red all over    Venlafaxine Other (See Comments)    Developed tics while taking - reaction to Effexor       Social History   Socioeconomic History   Marital status: Married    Spouse name: Tonique Mares   Number of children: 2   Years of education: Not on file   Highest education level: Associate degree: occupational, Scientist, product/process development, or vocational program  Occupational History   Occupation: disabilty    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  Tobacco Use   Smoking status: Former    Current packs/day: 0.00    Average packs/day: 1 pack/day for 47.0 years (47.0 ttl pk-yrs)    Types: Cigarettes    Start  date: 09/14/1972    Quit date: 09/15/2019    Years since quitting: 3.7    Passive exposure: Past   Smokeless tobacco: Never  Vaping Use   Vaping status: Every Day   Start date: 09/15/2019   Substances: Nicotine  Substance and Sexual Activity   Alcohol use: No   Drug use: No   Sexual activity: Not on file  Other Topics Concern   Not on file  Social History Narrative   Right handed   Caffeine-1-2 cups daily   Social Determinants of Health   Financial Resource Strain: High Risk (06/19/2021)   Overall Financial Resource Strain (CARDIA)    Difficulty of Paying Living Expenses: Very hard  Food Insecurity: No Food Insecurity (05/27/2023)   Hunger Vital Sign    Worried About Running Out of Food in the Last Year: Never true    Ran Out of Food in the Last Year: Never true  Transportation Needs: No Transportation Needs (05/27/2023)   PRAPARE - Administrator, Civil Service (Medical): No    Lack of Transportation (Non-Medical): No  Physical Activity: Not on file  Stress: Not on file  Social Connections: Not on file  Intimate Partner Violence: Not At Risk (05/27/2023)   Humiliation, Afraid, Rape, and Kick questionnaire    Fear of Current or Ex-Partner: No    Emotionally Abused: No    Physically Abused: No    Sexually Abused: No      Family History  Problem Relation Age of Onset   Asthma Mother    COPD Mother    Diabetes Mother    Macular degeneration Mother    Congestive Heart Failure Father     PHYSICAL EXAM: There were no vitals filed for this visit.  GENERAL: Well nourished, well developed, and in no apparent distress at rest.  HEENT: Negative for arcus senilis or xanthelasma. There is no scleral icterus.  The mucous membranes are pink and moist.  NECK: Supple, No masses. Normal carotid upstrokes without bruits. No masses or thyromegaly.    CHEST: There are no chest wall deformities. There is no chest wall tenderness. Respirations are unlabored.  Lungs- CTA  B/L CARDIAC:  JVP: 7 cm          Normal rate with regular rhythm. No murmurs, rubs or gallops.  Pulses are 2+ and symmetrical in upper and lower extremities. No edema.  ABDOMEN: Soft, non-tender, non-distended. There are no masses or hepatomegaly. There are normal bowel sounds.  EXTREMITIES: Warm and well perfused with no cyanosis, clubbing.  LYMPHATIC: No axillary or supraclavicular lymphadenopathy.  NEUROLOGIC: Patient is oriented x3 with no focal or lateralizing neurologic deficits.  PSYCH: Patients affect is appropriate, there is no evidence of anxiety or depression.  SKIN: Warm and dry; no lesions or wounds.     DATA REVIEW  ECG: 02/18/23: NSR  As per my personal interpretation  ECHO: 12/22/22: LVEF 60-65%, grade II DD; normal RV function.   CATH: 01/18/23: Overall, no significant change when compared to angiography performed in June 2021. LIMA to LAD is patent. Bypass graft to circumflex is patent.  Distribution of native vessel after the insertion is very small.  The native vessel diameter is minuscule. Bypass graft to PDA is patent.  PDA is totally occluded at its ostium and is jailed by stent. Left main is widely patent. LAD is severely and diffusely diseased with 70 to 80% stenosis from proximal to mid.  Competitive flow is noted in the mid LAD due to LIMA graft insertion. Circumflex is totally occluded in the mid vessel.  Very faint collaterals are noted to small "threadlike vessels". Right coronary is widely patent and has a heavy burden of stents that extends from the mid vessel into the continuation beyond the origin of the PDA.  The PDA is jailed.  No significant obstruction is noted in the right coronary other than the PDA which is supplied by a graft. Hyperdynamic left ventricle.  EF greater than 70%.  LVEDP is normal.   ASSESSMENT & PLAN:  Heart failure with preserved EF - Recurrent hospital admissions for HFpEF - Will hold off on spironolactone due to fluctuating  sCr over the past several lab checks and CKD IV; can start in the future.  - Strongly considering use of cardiomems due to recurrent admissions.  - Re-admitted in June 2024 with decompensated HFpEF despite compliance with diuretics. She has now had frequent hospital admissions.  - During our prior visit, we started the [facility] and she has had improvement in sCr from 3.5 to 2.3 in addition to significant improvement in functional status. Now euvolemic with no LE edema.  - Due to improvement in renal function with temporary remote monitoring will proceed with cardiomem at request of patient. Prior to remote monitoring she had 5 hospital admissions with rapid rise in sCr. Now her renal function is improving and her functional class / volume has improved significantly.  - Continue torsemide 60mg  BID - Repeat BMP/BNP today.  - Continue farxiga 10mg  daily.   2. CAD s/p CABG - 3V CABG w/ LIMA to LAD, SVG-OM, SVG-PDA - Ranexa 1000mg  BID and metoprolol succinate 12.5mg  qhs.  - Continue follow up with general cardiology   3. Hyperlipidemia - rosuvastatin 40mg  nightly   Aditya Sabharwal [facility] Failure Mechanical Circulatory Support

## 2023-06-15 ENCOUNTER — Encounter (HOSPITAL_COMMUNITY): Payer: Self-pay

## 2023-06-15 ENCOUNTER — Ambulatory Visit (HOSPITAL_COMMUNITY)
Admission: RE | Admit: 2023-06-15 | Discharge: 2023-06-15 | Disposition: A | Discharge status: Home / Self Care | Payer: Medicare Other | Source: Ambulatory Visit | Attending: Family Medicine | Admitting: Family Medicine

## 2023-06-15 ENCOUNTER — Ambulatory Visit: Payer: Medicare Other

## 2023-06-15 VITALS — BP 126/60 | HR 101 | Wt 171.0 lb

## 2023-06-15 DIAGNOSIS — Z79899 Other long term (current) drug therapy: Secondary | ICD-10-CM | POA: Diagnosis not present

## 2023-06-15 DIAGNOSIS — I5032 Chronic diastolic (congestive) heart failure: Secondary | ICD-10-CM

## 2023-06-15 DIAGNOSIS — N184 Chronic kidney disease, stage 4 (severe): Secondary | ICD-10-CM | POA: Diagnosis not present

## 2023-06-15 DIAGNOSIS — I130 Hypertensive heart and chronic kidney disease with heart failure and stage 1 through stage 4 chronic kidney disease, or unspecified chronic kidney disease: Secondary | ICD-10-CM | POA: Insufficient documentation

## 2023-06-15 DIAGNOSIS — Z951 Presence of aortocoronary bypass graft: Secondary | ICD-10-CM

## 2023-06-15 DIAGNOSIS — E1122 Type 2 diabetes mellitus with diabetic chronic kidney disease: Secondary | ICD-10-CM | POA: Insufficient documentation

## 2023-06-15 DIAGNOSIS — E785 Hyperlipidemia, unspecified: Secondary | ICD-10-CM | POA: Insufficient documentation

## 2023-06-15 DIAGNOSIS — R000 Tachycardia, unspecified: Secondary | ICD-10-CM | POA: Diagnosis not present

## 2023-06-15 DIAGNOSIS — I5033 Acute on chronic diastolic (congestive) heart failure: Secondary | ICD-10-CM | POA: Insufficient documentation

## 2023-06-15 DIAGNOSIS — F17210 Nicotine dependence, cigarettes, uncomplicated: Secondary | ICD-10-CM | POA: Diagnosis not present

## 2023-06-15 DIAGNOSIS — Z5986 Financial insecurity: Secondary | ICD-10-CM | POA: Insufficient documentation

## 2023-06-15 DIAGNOSIS — I25119 Atherosclerotic heart disease of native coronary artery with unspecified angina pectoris: Secondary | ICD-10-CM

## 2023-06-15 DIAGNOSIS — E7849 Other hyperlipidemia: Secondary | ICD-10-CM

## 2023-06-15 DIAGNOSIS — N1832 Chronic kidney disease, stage 3b: Secondary | ICD-10-CM | POA: Diagnosis not present

## 2023-06-15 DIAGNOSIS — I2510 Atherosclerotic heart disease of native coronary artery without angina pectoris: Secondary | ICD-10-CM | POA: Diagnosis not present

## 2023-06-15 LAB — MAGNESIUM: Magnesium: 2.5 mg/dL — ABNORMAL HIGH (ref 1.7–2.4)

## 2023-06-15 LAB — BASIC METABOLIC PANEL
Anion gap: 10 (ref 5–15)
BUN: 50 mg/dL — ABNORMAL HIGH (ref 8–23)
CO2: 27 mmol/L (ref 22–32)
Calcium: 9.1 mg/dL (ref 8.9–10.3)
Chloride: 99 mmol/L (ref 98–111)
Creatinine, Ser: 3 mg/dL — ABNORMAL HIGH (ref 0.44–1.00)
GFR, Estimated: 17 mL/min — ABNORMAL LOW (ref >60)
Glucose, Bld: 181 mg/dL — ABNORMAL HIGH (ref 70–99)
Potassium: 4.1 mmol/L (ref 3.5–5.1)
Sodium: 136 mmol/L (ref 135–145)

## 2023-06-15 LAB — BRAIN NATRIURETIC PEPTIDE: B Natriuretic Peptide: 61.8 pg/mL (ref 0.0–100.0)

## 2023-06-15 MED ORDER — METOLAZONE 2.5 MG PO TABS
2.5000 mg | ORAL_TABLET | Freq: Once | ORAL | Status: AC
Start: 2023-06-15 — End: 2023-06-15
  Administered 2023-06-15: 2.5 mg via ORAL

## 2023-06-15 MED ORDER — POTASSIUM CHLORIDE CRYS ER 20 MEQ PO TBCR
40.0000 meq | EXTENDED_RELEASE_TABLET | Freq: Once | ORAL | Status: AC
  Administered 2023-06-15: 40 meq via ORAL

## 2023-06-15 MED ORDER — FUROSEMIDE 10 MG/ML IJ SOLN: 80.0000 mg | Freq: Once | INTRAMUSCULAR | Status: AC

## 2023-06-15 MED ORDER — FUROSEMIDE 10 MG/ML IJ SOLN
INTRAMUSCULAR | Status: AC
  Administered 2023-06-15: 80 mg via INTRAVENOUS
  Filled 2023-06-15: qty 8

## 2023-06-15 NOTE — Progress Notes (Signed)
ReDS Vest / Clip - 06/15/23 1015       ReDS Vest / Clip   Station Marker A    Ruler Value 32    ReDS Value Range Low volume    ReDS Actual Value 34

## 2023-06-15 NOTE — Patient Instructions (Signed)
No changes to medications.  Labs done today, your results will be available in MyChart, we will contact you for abnormal readings.  Your physician recommends that you schedule a follow-up appointment in: 1 week.  If you have any questions or concerns before your next appointment please send [place] a message through [place] or call our office at [phone].    TO LEAVE A MESSAGE FOR THE NURSE SELECT OPTION 2, PLEASE LEAVE A MESSAGE INCLUDING: YOUR NAME DATE OF BIRTH CALL BACK NUMBER REASON FOR CALL**this is important as we prioritize the call backs  YOU WILL RECEIVE A CALL BACK THE SAME DAY AS LONG AS YOU CALL BEFORE 4:00 PM  At the [facility], you and your health needs are our priority. As part of our continuing mission to provide you with exceptional heart care, we have created designated Provider Care Teams. These Care Teams include your primary Cardiologist (physician) and Advanced Practice Providers (APPs- Physician Assistants and Nurse Practitioners) who all work together to provide you with the care you need, when you need it.   You may see any of the following providers on your designated Care Team at your next follow up: Dr Arvilla Meres Dr Marca Ancona Dr. Marcos Eke, NP Robbie Lis, [place] [facility] [place], [place] Brynda Peon, NP Karle Plumber, PharmD   Please be sure to bring in all your medications bottles to every appointment.    Thank you for choosing [facility]

## 2023-06-16 ENCOUNTER — Encounter: Payer: Medicare Other | Admitting: Internal Medicine

## 2023-06-16 VITALS — BP 129/64 | Temp 97.9°F | Resp 14

## 2023-06-16 DIAGNOSIS — N184 Chronic kidney disease, stage 4 (severe): Secondary | ICD-10-CM

## 2023-06-16 DIAGNOSIS — D631 Anemia in chronic kidney disease: Secondary | ICD-10-CM

## 2023-06-16 LAB — GLUCOSE HEMOCUE WAIVED: Hemoglobin: 11.4

## 2023-06-16 MED ORDER — EPOETIN ALFA-EPBX 4000 UNIT/ML IJ SOLN: 4000.0000 [IU] | Freq: Once | INTRAMUSCULAR | Status: DC

## 2023-06-16 NOTE — Progress Notes (Signed)
HBG was 11.4, Injection was not needed.

## 2023-06-18 ENCOUNTER — Telehealth: Payer: Self-pay | Admitting: *Deleted

## 2023-06-18 DIAGNOSIS — I5032 Chronic diastolic (congestive) heart failure: Secondary | ICD-10-CM

## 2023-06-18 DIAGNOSIS — E611 Iron deficiency: Secondary | ICD-10-CM

## 2023-06-18 NOTE — Telephone Encounter (Signed)
Patient informed and verbalized understanding of plan. Copy sent to PCP

## 2023-06-18 NOTE — Progress Notes (Signed)
ADVANCED HEART FAILURE CLINIC NOTE  Primary Care: Benita Stabile, MD Primary Cardiologist: Luane School, MD HF Cardiologist: Dr. Gasper Lloyd  HPI: Meghan Terry is a 62 y.o. female with CAD status post CABG in 2020 with LIMA to LAD, SVG to OM and SVG to PDA, history of PCI x 3 to the RCA, hypertension, hyperlipidemia, type 2 diabetes, stage IV CKD, chronic anemia and heart failure with preserved ejection fraction presenting today for follow up.  She was recently admitted to the hospital after presenting to outpatient clinic visit with decompensated HFpEF.  She was diuresed 4.5 L with IV Lasix and discharged home. During our last appointment, we increased diuretics, however, she once more had another episode of decompensated HFpEF leading to admission in 6/24; now over 6 hospitalizations for HFpEF in the past 1 year.   Interval hx:  - Follow up 7/24, volume stable. Arranged for Zoll HFMS (148 lbs, REDs 32%) to attempt to keep keep close eye on labile volume status.  - Follow up 8/24, significant improvement in energy level, dyspnea and LEE, SCr improved 3.5>>2.3. Plan to move forward with CardioMEMs implant.  Admitted 05/27/23 with CP and decompensation HFpEF. Had 20 lb weight gain in 2.5 weeks. It was felt CP 2/2 hypervolemia. She was diuresed. HsTroponin 15>>17, LHC deferred. Continued with dull chest ache and started on colchicine for presumed acute pericarditis (Ranexa held and Crestor dose reduced). No ESR or CRP to review. She was discharged home, weight 161 lbs.   Today she returns for post hospital HF follow up. Overall feeling fair. She is SOB walking short distances, at baseline she is SOB walking up steps. Feels she is holding fluid in her abdomen. Continues with atypical CP, 3 episodes/day but not taking nitroglycerin, pain improves with rest and Tramdaol. She is unable to tell a difference in CP symptoms on the colchicine. Denies palpitations, dizziness, or PND/Orthopnea. Appetite ok.  No fever or chills. She is not weighing at home.   Past Medical History:  Diagnosis Date   Acute diastolic CHF (congestive heart failure) (HCC) 06/08/2021   Acute pulmonary edema (HCC)    Anemia due to stage 4 chronic kidney disease (HCC) 07/20/2022   Angina pectoris (HCC) 11/17/2019   Anxiety    Atypical chest pain 12/30/2020   Benign hypertension with CKD (chronic kidney disease) stage IV (HCC) 07/20/2022   CHF (congestive heart failure) (HCC) 06/08/2021   CKD (chronic kidney disease) stage 3b, GFR 30-59 ml/min 12/03/2019   Coronary artery disease    Coronary artery disease involving native coronary artery of native heart with angina pectoris (HCC) 10/03/2019   Depression 04/25/2017   Diabetes mellitus due to underlying condition with unspecified complications (HCC) 09/05/2019   Diabetes mellitus with stage 4 chronic kidney disease GFR 15-29 (HCC) 07/20/2022   Essential hypertension 09/05/2019   Ex-smoker 09/05/2019   Family history of coronary artery disease 09/05/2019   History of depression 04/15/2020   Hx of diabetes mellitus 04/15/2020   Hyperlipidemia with target LDL less than 70 11/24/2019   Hyperphosphatemia 07/20/2022   Hypertension    Hypokalemia 04/30/2022   Hypothyroid    Migraine 04/25/2017   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 30 years  Controlled with requip   RLS (restless legs syndrome)    S/P  CABG x 3 09/18/2019   Syncope and collapse 04/29/2022   Thyroid disease    UTI (urinary tract infection) 04/30/2022   Current Outpatient Medications  Medication Sig Dispense Refill   acetaminophen (TYLENOL) 500 MG tablet Take 2 tablets by mouth 3 times daily as needed for pain 100 tablet 0   aspirin EC 81 MG tablet Take 1 tablet (81 mg total) by  mouth daily. 90 tablet 3   buPROPion (WELLBUTRIN XL) 300 MG 24 hr tablet Take 1 tablet (300 mg total) by mouth daily. 90 tablet 2   cholecalciferol 25 MCG (1000 UT) tablet Take 1 tablet (1,000 Units total) by mouth in the morning and at bedtime.     clopidogrel (PLAVIX) 75 MG tablet TAKE 1 TABLET BY MOUTH DAILY 90 tablet 2   colchicine 0.6 MG tablet Take 1 tablet (0.6 mg total) by mouth 3 (three) times a week. For 2 weeks. 6 tablet 0   cyanocobalamin (VITAMIN B12) 1000 MCG tablet Take 1 tablet (1,000 mcg total) by mouth daily. 30 tablet 1   dapagliflozin propanediol (FARXIGA) 10 MG TABS tablet Take 1 tablet (10 mg total) by mouth daily before breakfast. Hold medication until follow-up with cardiology service. (Patient taking differently: Take 10 mg by mouth daily before breakfast.)     ferrous sulfate 325 (65 FE) MG EC tablet Take 325 mg by mouth 2 (two) times daily.     folic acid (FOLVITE) 1 MG tablet Take 1 tablet (1 mg total) by mouth daily. 30 tablet 1   gabapentin (NEURONTIN) 100 MG capsule Take 3 capsules (300 mg total) by mouth at bedtime. (Patient taking differently: Take 100 mg by mouth 3 (three) times daily.)     gatifloxacin (ZYMAXID) 0.5 % SOLN Place 1 drop into the left eye 4 (four) times daily.     isosorbide mononitrate (IMDUR) 30 MG 24 hr tablet Take 0.5 tablets (15 mg total) by mouth daily. 45 tablet 3   ketorolac (ACULAR) 0.5 % ophthalmic solution Place 1 drop into the left eye 4 (four) times daily.     levothyroxine (SYNTHROID) 50 MCG tablet Take 1 tablet (50 mcg total) by mouth See admin instructions. Take 1 tablet in the morning with breakfast on SATURDAY AND SUNDAY.     levothyroxine (SYNTHROID) 75 MCG tablet Take 1 tablet (75 mcg total) by mouth daily Monday thru Friday. 60 tablet 3   linaclotide (LINZESS) 145 MCG CAPS capsule Take 145 mcg by mouth as needed (diarrhea).     metolazone (ZAROXOLYN) 5 MG tablet Once a week on Wednesdays if you gain weight or swelling appreciated.  (Patient taking differently: Take 5 mg by mouth once a week. Wednesday) 12 tablet 1   midodrine (PROAMATINE) 5 MG tablet Take 1 tablet (5 mg total) by mouth 3 (three) times daily with meals. 270 tablet 3   nitroGLYCERIN (NITROSTAT) 0.4 MG SL tablet Place 1 tablet (0.4 mg total) under the tongue every 5 (five) minutes x 3 doses as needed for chest pain. 25 tablet 7   ondansetron (ZOFRAN) 4 MG tablet take 1 tablet (4 mg) by mouth 2 times per day as needed for nausea (Patient taking differently: Take 4 mg by mouth as needed for nausea or vomiting.) 180 tablet 1   pantoprazole (PROTONIX) 40 MG tablet Take 1 tablet (40 mg total) by mouth 2 (two) times daily. 60 tablet 1   potassium chloride SA (KLOR-CON M) 20 MEQ tablet Take 40 mEq by mouth 2 (two) times daily.  prednisoLONE acetate (PRED FORTE) 1 % ophthalmic suspension Place 1 drop into the left eye in the morning, at noon, and at bedtime.     promethazine (PHENERGAN) 25 MG tablet Take 1 tablet (25 mg total) by mouth every 6 (six) hours as needed for nausea or vomiting. (Patient taking differently: Take 25 mg by mouth as needed for nausea or vomiting.)     rOPINIRole (REQUIP) 4 MG tablet Take 1 tablet (4 mg total) by mouth 1 to 3 hours before bedtime. 90 tablet 0   rosuvastatin (CRESTOR) 5 MG tablet Take 1 tablet (5 mg total) by mouth at bedtime. 30 tablet 1   topiramate (TOPAMAX) 100 MG tablet TAKE 1 TABLET BY MOUTH TWICE DAILY. 180 tablet 3   torsemide (DEMADEX) 20 MG tablet Take 60 mg by mouth 2 (two) times daily.     traMADol (ULTRAM) 50 MG tablet Take 1 tablet (50 mg total) by mouth daily as needed for moderate pain.     traZODone (DESYREL) 150 MG tablet Take 150 mg by mouth at bedtime.     traZODone (DESYREL) 50 MG tablet Take 50 mg by mouth at bedtime.     No current facility-administered medications for this visit.   Allergies  Allergen Reactions   Abilify [Aripiprazole] Other (See Comments)    "Makes me sick"    Ciprofloxacin Nausea  And Vomiting   Carbidopa-Levodopa Other (See Comments)    Developed tics while taking    Prednisone Other (See Comments)    Turns red all over    Venlafaxine Other (See Comments)    Developed tics while taking - reaction to Effexor    Social History   Socioeconomic History   Marital status: Married    Spouse name: Lamees Lynam   Number of children: 2   Years of education: Not on file   Highest education level: Associate degree: occupational, Scientist, product/process development, or vocational program  Occupational History   Occupation: disabilty    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  Tobacco Use   Smoking status: Former    Current packs/day: 0.00    Average packs/day: 1 pack/day for 47.0 years (47.0 ttl pk-yrs)    Types: Cigarettes    Start date: 09/14/1972    Quit date: 09/15/2019    Years since quitting: 3.7    Passive exposure: Past   Smokeless tobacco: Never  Vaping Use   Vaping status: Every Day   Start date: 09/15/2019   Substances: Nicotine  Substance and Sexual Activity   Alcohol use: No   Drug use: No   Sexual activity: Not on file  Other Topics Concern   Not on file  Social History Narrative   Right handed   Caffeine-1-2 cups daily   Social Determinants of Health   Financial Resource Strain: High Risk (06/19/2021)   Overall Financial Resource Strain (CARDIA)    Difficulty of Paying Living Expenses: Very hard  Food Insecurity: No Food Insecurity (05/27/2023)   Hunger Vital Sign    Worried About Running Out of Food in the Last Year: Never true    Ran Out of Food in the Last Year: Never true  Transportation Needs: No Transportation Needs (05/27/2023)   PRAPARE - Administrator, Civil Service (Medical): No    Lack of Transportation (Non-Medical): No  Physical Activity: Not on file  Stress: Not on file  Social Connections: Not on file  Intimate Partner Violence: Not At Risk (05/27/2023)   Humiliation, Afraid, Rape, and Kick questionnaire  Fear of Current or  Ex-Partner: No    Emotionally Abused: No    Physically Abused: No    Sexually Abused: No   Family History  Problem Relation Age of Onset   Asthma Mother    COPD Mother    Diabetes Mother    Macular degeneration Mother    Congestive Heart Failure Father     There were no vitals taken for this visit.  Wt Readings from Last 3 Encounters:  06/15/23 77.6 kg (171 lb)  06/11/23 76.7 kg (169 lb)  05/29/23 73 kg (160 lb 15 oz)   PHYSICAL EXAM: General:  NAD. No resp difficulty HEENT: Normal Neck: Supple. JVP 10 Carotids 2+ bilat; no bruits. No lymphadenopathy or thryomegaly appreciated. Cor: PMI nondisplaced. Regular rate & rhythm. No rubs, gallops or murmurs. Lungs: Clear Abdomen: Soft, nontender, +distended. No hepatosplenomegaly. No bruits or masses. Good bowel sounds. Extremities: No cyanosis, clubbing, rash, 2+ BLE pre-tibial edema Neuro: Alert & oriented x 3, cranial nerves grossly intact. Moves all 4 extremities w/o difficulty. Affect pleasant.  DATA REVIEW  ECG: 02/18/23: NSR  As per my personal interpretation 06/15/23: ST 102 bpm  ECHO: 12/22/22: LVEF 60-65%, grade II DD; normal RV function. 05/28/23: LVEF 60-65%, grade II DD, normal RV, PASP 26  CATH: 01/18/23: Overall, no significant change when compared to angiography performed in June 2021. LIMA to LAD is patent. Bypass graft to circumflex is patent.  Distribution of native vessel after the insertion is very small.  The native vessel diameter is minuscule. Bypass graft to PDA is patent.  PDA is totally occluded at its ostium and is jailed by stent. Left main is widely patent. LAD is severely and diffusely diseased with 70 to 80% stenosis from proximal to mid.  Competitive flow is noted in the mid LAD due to LIMA graft insertion. Circumflex is totally occluded in the mid vessel.  Very faint collaterals are noted to small "threadlike vessels". Right coronary is widely patent and has a heavy burden of stents that extends  from the mid vessel into the continuation beyond the origin of the PDA.  The PDA is jailed.  No significant obstruction is noted in the right coronary other than the PDA which is supplied by a graft. Hyperdynamic left ventricle.  EF greater than 70%.  LVEDP is normal.   ASSESSMENT & PLAN: Heart failure with preserved EF - Recurrent hospital admissions for HFpEF - Re-admitted in June 2024 with decompensated HFpEF despite compliance with diuretics. She has now had frequent hospital admissions.  - During prior visit, we started the [facility] and she has had improvement in sCr from 3.5 to 2.3 in addition to significant improvement in functional status.  - Prior to remote monitoring she had 5 hospital admissions with rapid rise in SCr. Approval process underway for Cardiomems implant. - Unfortunately she had another admission for decompensated HFpEF 8/24. Echo 8/24 showed EF 60-65%, RV normal - Today, worse NYHA IIb-III, she is volume overloaded on exam, weight 10 lbs up from discharge. ReDs 34%, R>L HF; suspect she is up more like 15-20 lbs. - Give IV lasix 80 mg + metolazone 2.5 mg + 40 KCL in infusion clinic today. - Continue torsemide 60 mg bid + metolazone 5 mg weekly (may ultimately require 2x/week dosing of metolazone) - Continue Farxiga 10 mg daily.  - Hold off on spiro with labile renal function, most recent SCr 2.78. Can consider in future. - Labs today, repeat at follow up next week.  2. CAD s/p CABG - 3V CABG w/ LIMA to LAD, SVG-OM, SVG-PDA - She has stopped her Toprol.  - Off Ranexa in setting of colchicine use. - Continue follow up with general cardiology   3. Hyperlipidemia - Continue rosuvastatin 5 mg (reduced in setting of colchicine use) - Per general cardiology  4. CKD - Follows with [facility], Dr. Wolfgang Phoenix - Baseline SCr 2.3-2.7 - Follow renal function   Follow up in 1 week with APP for fluid check. Reviewed with Dr. Gasper Lloyd.  Prince Rome, FNP-BC Advanced Heart  Failure 06/18/23

## 2023-06-18 NOTE — Telephone Encounter (Signed)
-----   Message from Vishnu P Mallipeddi sent at 06/17/2023  8:51 AM EDT ----- Hb < 15 and Ferritin < 100, consistent with iron deficiency and diastolic heart failure patient.  Please place hematology referral for IV iron infusion due to multiple heart failure admissions in the last 6 months.

## 2023-06-21 DIAGNOSIS — F17210 Nicotine dependence, cigarettes, uncomplicated: Secondary | ICD-10-CM | POA: Diagnosis not present

## 2023-06-21 DIAGNOSIS — Z87891 Personal history of nicotine dependence: Secondary | ICD-10-CM | POA: Diagnosis not present

## 2023-06-21 DIAGNOSIS — Z122 Encounter for screening for malignant neoplasm of respiratory organs: Secondary | ICD-10-CM | POA: Diagnosis not present

## 2023-06-22 DIAGNOSIS — E039 Hypothyroidism, unspecified: Secondary | ICD-10-CM | POA: Diagnosis not present

## 2023-06-22 DIAGNOSIS — I5033 Acute on chronic diastolic (congestive) heart failure: Secondary | ICD-10-CM | POA: Diagnosis not present

## 2023-06-22 DIAGNOSIS — D509 Iron deficiency anemia, unspecified: Secondary | ICD-10-CM | POA: Diagnosis not present

## 2023-06-22 DIAGNOSIS — E119 Type 2 diabetes mellitus without complications: Secondary | ICD-10-CM | POA: Diagnosis not present

## 2023-06-22 DIAGNOSIS — E559 Vitamin D deficiency, unspecified: Secondary | ICD-10-CM | POA: Diagnosis not present

## 2023-06-23 ENCOUNTER — Ambulatory Visit (HOSPITAL_COMMUNITY)
Admission: RE | Admit: 2023-06-23 | Discharge: 2023-06-23 | Disposition: A | Discharge status: Home / Self Care | Payer: Medicare Other | Source: Ambulatory Visit | Attending: Family Medicine | Admitting: Family Medicine

## 2023-06-23 ENCOUNTER — Encounter (HOSPITAL_COMMUNITY): Payer: Self-pay

## 2023-06-23 VITALS — BP 118/56 | HR 89 | Wt 171.6 lb

## 2023-06-23 DIAGNOSIS — Z79899 Other long term (current) drug therapy: Secondary | ICD-10-CM | POA: Diagnosis not present

## 2023-06-23 DIAGNOSIS — Z7984 Long term (current) use of oral hypoglycemic drugs: Secondary | ICD-10-CM | POA: Diagnosis not present

## 2023-06-23 DIAGNOSIS — I25119 Atherosclerotic heart disease of native coronary artery with unspecified angina pectoris: Secondary | ICD-10-CM | POA: Insufficient documentation

## 2023-06-23 DIAGNOSIS — D631 Anemia in chronic kidney disease: Secondary | ICD-10-CM | POA: Diagnosis not present

## 2023-06-23 DIAGNOSIS — N1832 Chronic kidney disease, stage 3b: Secondary | ICD-10-CM

## 2023-06-23 DIAGNOSIS — D509 Iron deficiency anemia, unspecified: Secondary | ICD-10-CM

## 2023-06-23 DIAGNOSIS — Z951 Presence of aortocoronary bypass graft: Secondary | ICD-10-CM | POA: Diagnosis not present

## 2023-06-23 DIAGNOSIS — I5032 Chronic diastolic (congestive) heart failure: Secondary | ICD-10-CM | POA: Insufficient documentation

## 2023-06-23 DIAGNOSIS — I130 Hypertensive heart and chronic kidney disease with heart failure and stage 1 through stage 4 chronic kidney disease, or unspecified chronic kidney disease: Secondary | ICD-10-CM | POA: Insufficient documentation

## 2023-06-23 DIAGNOSIS — E7849 Other hyperlipidemia: Secondary | ICD-10-CM | POA: Diagnosis not present

## 2023-06-23 DIAGNOSIS — E785 Hyperlipidemia, unspecified: Secondary | ICD-10-CM | POA: Diagnosis not present

## 2023-06-23 DIAGNOSIS — N184 Chronic kidney disease, stage 4 (severe): Secondary | ICD-10-CM | POA: Diagnosis not present

## 2023-06-23 DIAGNOSIS — E1122 Type 2 diabetes mellitus with diabetic chronic kidney disease: Secondary | ICD-10-CM | POA: Diagnosis not present

## 2023-06-23 MED ORDER — ISOSORBIDE MONONITRATE ER 30 MG PO TB24: 30.0000 mg | ORAL_TABLET | Freq: Every day | ORAL | 3 refills | Status: DC

## 2023-06-23 NOTE — Patient Instructions (Addendum)
Thank you for coming in today  If you had labs drawn today, any labs that are abnormal the clinic will call you No news is good news  Medications: Increase Imudur to 30 mg 1 tablet daily   Follow up appointments:  Your physician recommends that you schedule a follow-up appointment in:  Follow up after cardiomems implant  The clinic will setup Iron infusions for you, you will be contacted once insurance authorization has been completed.   Do the following things EVERYDAY: Weigh yourself in the morning before breakfast. Write it down and keep it in a log. Take your medicines as prescribed Eat low salt foods--Limit salt (sodium) to 2000 mg per day.  Stay as active as you can everyday Limit all fluids for the day to less than 2 liters   At the [facility], you and your health needs are our priority. As part of our continuing mission to provide you with exceptional heart care, we have created designated Provider Care Teams. These Care Teams include your primary Cardiologist (physician) and Advanced Practice Providers (APPs- Physician Assistants and Nurse Practitioners) who all work together to provide you with the care you need, when you need it.   You may see any of the following providers on your designated Care Team at your next follow up: Dr Arvilla Meres Dr Marca Ancona Dr. Marcos Eke, NP Robbie Lis, [place] [facility] [place], [place] Brynda Peon, NP Karle Plumber, PharmD   Please be sure to bring in all your medications bottles to every appointment.    Thank you for choosing [facility]  If you have any questions or concerns before your next appointment please send [place] a message through [place] or call our office at [phone].    TO LEAVE A MESSAGE FOR THE NURSE SELECT OPTION 2, PLEASE LEAVE A MESSAGE INCLUDING: YOUR NAME DATE OF BIRTH CALL BACK NUMBER REASON FOR CALL**this is  important as we prioritize the call backs  YOU WILL RECEIVE A CALL BACK THE SAME DAY AS LONG AS YOU CALL BEFORE 4:00 PM

## 2023-06-23 NOTE — Progress Notes (Signed)
ReDS Vest / Clip - 06/23/23 1000       ReDS Vest / Clip   Station Marker A    Ruler Value 32    ReDS Value Range Low volume    ReDS Actual Value 29

## 2023-06-23 NOTE — Progress Notes (Signed)
Called Pt primary care physician Dr. Carlena Hurl office [phone] to get most recent labs faxed to our clinic [phone]. Receptionist stated that she would have it faxed over to our office.

## 2023-06-24 ENCOUNTER — Ambulatory Visit (HOSPITAL_COMMUNITY): Payer: Medicare Other

## 2023-06-28 ENCOUNTER — Ambulatory Visit
Admission: RE | Admit: 2023-06-28 | Discharge: 2023-06-28 | Disposition: A | Discharge status: Home / Self Care | Payer: Medicare Other | Source: Ambulatory Visit | Attending: Neurology | Admitting: Neurology

## 2023-06-28 DIAGNOSIS — Z87828 Personal history of other (healed) physical injury and trauma: Secondary | ICD-10-CM

## 2023-06-28 DIAGNOSIS — G2581 Restless legs syndrome: Secondary | ICD-10-CM

## 2023-06-28 DIAGNOSIS — R4189 Other symptoms and signs involving cognitive functions and awareness: Secondary | ICD-10-CM

## 2023-06-28 MED ORDER — GADOPICLENOL 0.5 MMOL/ML IV SOLN
8.0000 mL | Freq: Once | INTRAVENOUS | Status: AC | PRN
  Administered 2023-06-28: 8 mL via INTRAVENOUS

## 2023-06-29 DIAGNOSIS — G2581 Restless legs syndrome: Secondary | ICD-10-CM | POA: Diagnosis not present

## 2023-06-29 DIAGNOSIS — R410 Disorientation, unspecified: Secondary | ICD-10-CM | POA: Diagnosis not present

## 2023-06-29 DIAGNOSIS — R413 Other amnesia: Secondary | ICD-10-CM | POA: Diagnosis not present

## 2023-06-29 DIAGNOSIS — D649 Anemia, unspecified: Secondary | ICD-10-CM | POA: Diagnosis not present

## 2023-06-29 DIAGNOSIS — G4700 Insomnia, unspecified: Secondary | ICD-10-CM | POA: Diagnosis not present

## 2023-06-29 DIAGNOSIS — E1165 Type 2 diabetes mellitus with hyperglycemia: Secondary | ICD-10-CM | POA: Diagnosis not present

## 2023-06-29 DIAGNOSIS — N184 Chronic kidney disease, stage 4 (severe): Secondary | ICD-10-CM | POA: Diagnosis not present

## 2023-06-29 DIAGNOSIS — I130 Hypertensive heart and chronic kidney disease with heart failure and stage 1 through stage 4 chronic kidney disease, or unspecified chronic kidney disease: Secondary | ICD-10-CM | POA: Diagnosis not present

## 2023-06-29 DIAGNOSIS — I5033 Acute on chronic diastolic (congestive) heart failure: Secondary | ICD-10-CM | POA: Diagnosis not present

## 2023-06-29 DIAGNOSIS — I2510 Atherosclerotic heart disease of native coronary artery without angina pectoris: Secondary | ICD-10-CM | POA: Diagnosis not present

## 2023-06-29 DIAGNOSIS — S0181XA Laceration without foreign body of other part of head, initial encounter: Secondary | ICD-10-CM | POA: Diagnosis not present

## 2023-06-29 DIAGNOSIS — N390 Urinary tract infection, site not specified: Secondary | ICD-10-CM | POA: Diagnosis not present

## 2023-06-30 ENCOUNTER — Ambulatory Visit: Payer: Medicare Other

## 2023-06-30 ENCOUNTER — Ambulatory Visit (HOSPITAL_COMMUNITY)
Admission: RE | Admit: 2023-06-30 | Discharge: 2023-06-30 | Disposition: A | Discharge status: Home / Self Care | Payer: Medicare Other | Source: Ambulatory Visit | Attending: Family Medicine | Admitting: Family Medicine

## 2023-06-30 DIAGNOSIS — Z1231 Encounter for screening mammogram for malignant neoplasm of breast: Secondary | ICD-10-CM | POA: Insufficient documentation

## 2023-07-01 DIAGNOSIS — R0600 Dyspnea, unspecified: Secondary | ICD-10-CM | POA: Diagnosis not present

## 2023-07-01 DIAGNOSIS — J479 Bronchiectasis, uncomplicated: Secondary | ICD-10-CM | POA: Diagnosis not present

## 2023-07-01 DIAGNOSIS — G4733 Obstructive sleep apnea (adult) (pediatric): Secondary | ICD-10-CM | POA: Diagnosis not present

## 2023-07-01 DIAGNOSIS — R400 Somnolence: Secondary | ICD-10-CM | POA: Diagnosis not present

## 2023-07-13 ENCOUNTER — Other Ambulatory Visit (HOSPITAL_COMMUNITY): Payer: Self-pay

## 2023-07-14 ENCOUNTER — Encounter: Payer: Self-pay | Admitting: Oncology

## 2023-07-14 ENCOUNTER — Inpatient Hospital Stay: Payer: Medicare Other

## 2023-07-14 ENCOUNTER — Encounter: Payer: Self-pay | Admitting: Nephrology

## 2023-07-14 ENCOUNTER — Inpatient Hospital Stay: Payer: Medicare Other | Attending: Oncology | Admitting: Oncology

## 2023-07-14 VITALS — BP 112/67 | HR 99 | Temp 98.1°F | Resp 16 | Ht 63.0 in | Wt 169.1 lb

## 2023-07-14 DIAGNOSIS — N189 Chronic kidney disease, unspecified: Secondary | ICD-10-CM | POA: Diagnosis not present

## 2023-07-14 DIAGNOSIS — E1122 Type 2 diabetes mellitus with diabetic chronic kidney disease: Secondary | ICD-10-CM | POA: Insufficient documentation

## 2023-07-14 DIAGNOSIS — E611 Iron deficiency: Secondary | ICD-10-CM | POA: Diagnosis not present

## 2023-07-14 DIAGNOSIS — Z87891 Personal history of nicotine dependence: Secondary | ICD-10-CM

## 2023-07-14 DIAGNOSIS — I5033 Acute on chronic diastolic (congestive) heart failure: Secondary | ICD-10-CM | POA: Diagnosis not present

## 2023-07-14 DIAGNOSIS — D649 Anemia, unspecified: Secondary | ICD-10-CM

## 2023-07-14 DIAGNOSIS — G4700 Insomnia, unspecified: Secondary | ICD-10-CM | POA: Insufficient documentation

## 2023-07-14 DIAGNOSIS — F5104 Psychophysiologic insomnia: Secondary | ICD-10-CM

## 2023-07-14 HISTORY — DX: Insomnia, unspecified: G47.00

## 2023-07-14 NOTE — Assessment & Plan Note (Signed)
Reports difficulty staying asleep. No clear cause identified. Currently taking 200mg  of trazodone for depression, but not helping with sleep. Likely a component of depression. -No changes to management discussed in this visit. -Continue to follow-up with psychiatry.

## 2023-07-14 NOTE — Patient Instructions (Signed)
VISIT SUMMARY:  During your visit, we discussed your ongoing issues with congestive heart failure, iron deficiency, chronic kidney disease, insomnia, and your history of smoking. We also talked about your general health maintenance. We made some changes to your treatment plan, which are detailed below.  YOUR PLAN:  -CONGESTIVE HEART FAILURE: This is a condition where your heart doesn't pump blood as well as it should. We plan to administer an IV iron infusion every 6 months, pending insurance approval. You should continue your current heart failure management with Dr. Jenene Slicker and the specialist in [place]. We will follow up every 3 months.  -IRON DEFICIENCY: This is a condition where your body doesn't have enough iron, which can make you feel tired and weak. We will reduce your oral iron to one tablet every other day to improve absorption and reduce side effects. Avoid taking it with caffeine or calcium, but you can take it with orange juice to enhance absorption. We will check your labs before your next visit in 3 months.  -CHRONIC KIDNEY DISEASE: This is a condition where your kidneys don't work as well as they should. You should continue your current management with Dr. Wolfgang Phoenix. We are open to taking over EPO shots if needed in the future.  INSTRUCTIONS:  For your general health maintenance, continue to elevate your legs when resting to manage swelling associated with your heart failure. Consider physical activity to help with fatigue, but balance this with the management of your leg swelling.

## 2023-07-14 NOTE — Assessment & Plan Note (Signed)
Likely secondary to iron deficiency and chronic kidney disease -Currently taking oral iron twice daily. Discussed benefits of reducing frequency to every other day to improve absorption and reduce GI side effects. -Reduce oral iron to one tablet every other day. Avoid taking with caffeine or calcium. Can take with orange juice to enhance absorption. -Continue current management with Dr. Gracelyn Nurse. -Open to taking over EPO shots if needed in the future. -Check labs before next visit in 3 months.

## 2023-07-14 NOTE — Progress Notes (Signed)
[facility] at [facility] HEMATOLOGY NEW VISIT  Benita Stabile, MD  REASON FOR REFERRAL: Heart failure with iron deficiency   HISTORY OF PRESENT ILLNESS: Meghan Terry 62 y.o. female referred by cardiologist Dr. Jenene Slicker for heart failure with iron deficiency.  She has a past medical history of CKD, diabetes, heart failure with preserved ejection fraction, depression, hyperlipidemia.  Patient stated that she had 6 admissions to the hospital this year for heart failure.She experiences constant shortness of breath, which is worse with exertion but improves with rest.  She also reports constant chest pain and fatigue.She also reports difficulty sleeping, waking up multiple times during the night and struggling to fall back asleep.  She has been taking trazodone and Wellbutrin with no improvement.  She has no other complaints.  She denies fever, chills, abdominal pain, blood in stools, melena.  She has CKD and follows with Dr. Wolfgang Phoenix, gets EPO shots as needed whenever her hemoglobin is less than 10 with him.  The patient has a history of smoking but quit in 2020. They also have a family history of heart disease and diabetes. The patient is currently living with their daughter due to frequent falls.  I have reviewed the past medical history, past surgical history, social history and family history with the patient   ALLERGIES:  is allergic to abilify [aripiprazole], ciprofloxacin, carbidopa-levodopa, prednisone, and venlafaxine.  MEDICATIONS:  Current Outpatient Medications  Medication Sig Dispense Refill   acetaminophen (TYLENOL) 500 MG tablet Take 2 tablets by mouth 3 times daily as needed for pain 100 tablet 0   aspirin EC 81 MG tablet Take 1 tablet (81 mg total) by mouth daily. 90 tablet 3   buPROPion (WELLBUTRIN XL) 300 MG 24 hr tablet Take 1 tablet (300 mg total) by mouth daily. 90 tablet 2   cholecalciferol 25 MCG (1000 UT) tablet Take 1 tablet (1,000 Units total)  by mouth in the morning and at bedtime.     clopidogrel (PLAVIX) 75 MG tablet TAKE 1 TABLET BY MOUTH DAILY 90 tablet 2   colchicine 0.6 MG tablet Take 1 tablet (0.6 mg total) by mouth 3 (three) times a week. For 2 weeks. 6 tablet 0   cyanocobalamin (VITAMIN B12) 1000 MCG tablet Take 1 tablet (1,000 mcg total) by mouth daily. 30 tablet 1   dapagliflozin propanediol (FARXIGA) 10 MG TABS tablet Take 1 tablet (10 mg total) by mouth daily before breakfast. Hold medication until follow-up with cardiology service.     ferrous sulfate 325 (65 FE) MG EC tablet Take 325 mg by mouth 2 (two) times daily.     folic acid (FOLVITE) 1 MG tablet Take 1 tablet (1 mg total) by mouth daily. 30 tablet 1   gabapentin (NEURONTIN) 100 MG capsule Take 3 capsules (300 mg total) by mouth at bedtime. (Patient taking differently: Take 100 mg by mouth 3 (three) times daily.)     gatifloxacin (ZYMAXID) 0.5 % SOLN Place 1 drop into the left eye 4 (four) times daily.     isosorbide mononitrate (IMDUR) 30 MG 24 hr tablet Take 1 tablet (30 mg total) by mouth daily. 90 tablet 3   ketorolac (ACULAR) 0.5 % ophthalmic solution Place 1 drop into the left eye 4 (four) times daily.     levothyroxine (SYNTHROID) 50 MCG tablet Take 1 tablet (50 mcg total) by mouth See admin instructions. Take 1 tablet in the morning with breakfast on SATURDAY AND SUNDAY.     levothyroxine (  SYNTHROID) 75 MCG tablet Take 1 tablet (75 mcg total) by mouth daily Monday thru Friday. 60 tablet 3   linaclotide (LINZESS) 145 MCG CAPS capsule Take 145 mcg by mouth as needed (diarrhea).     metolazone (ZAROXOLYN) 5 MG tablet Once a week on Wednesdays if you gain weight or swelling appreciated. (Patient taking differently: Take 5 mg by mouth once a week. Wednesday) 12 tablet 1   midodrine (PROAMATINE) 5 MG tablet Take 1 tablet (5 mg total) by mouth 3 (three) times daily with meals. 270 tablet 3   pantoprazole (PROTONIX) 40 MG tablet Take 1 tablet (40 mg total) by mouth 2  (two) times daily. 60 tablet 1   potassium chloride SA (KLOR-CON M) 20 MEQ tablet Take 40 mEq by mouth 2 (two) times daily.     promethazine (PHENERGAN) 25 MG tablet Take 1 tablet (25 mg total) by mouth every 6 (six) hours as needed for nausea or vomiting.     rOPINIRole (REQUIP) 4 MG tablet Take 1 tablet (4 mg total) by mouth 1 to 3 hours before bedtime. 90 tablet 0   rosuvastatin (CRESTOR) 5 MG tablet Take 1 tablet (5 mg total) by mouth at bedtime. 30 tablet 1   topiramate (TOPAMAX) 100 MG tablet TAKE 1 TABLET BY MOUTH TWICE DAILY. 180 tablet 3   torsemide (DEMADEX) 20 MG tablet Take 60 mg by mouth 2 (two) times daily.     traMADol (ULTRAM) 50 MG tablet Take 1 tablet (50 mg total) by mouth daily as needed for moderate pain.     traZODone (DESYREL) 150 MG tablet Take 150 mg by mouth at bedtime.     traZODone (DESYREL) 50 MG tablet Take 50 mg by mouth at bedtime.     nitroGLYCERIN (NITROSTAT) 0.4 MG SL tablet Place 1 tablet (0.4 mg total) under the tongue every 5 (five) minutes x 3 doses as needed for chest pain. (Patient not taking: Reported on 07/14/2023) 25 tablet 7   ondansetron (ZOFRAN) 4 MG tablet take 1 tablet (4 mg) by mouth 2 times per day as needed for nausea (Patient not taking: Reported on 07/14/2023) 180 tablet 1   No current facility-administered medications for this visit.     REVIEW OF SYSTEMS:   Constitutional: Denies fevers, chills or night sweats Eyes: Denies blurriness of vision Ears, nose, mouth, throat, and face: Denies mucositis or sore throat Gastrointestinal:  Denies nausea, heartburn or change in bowel habits Skin: Denies abnormal skin rashes Lymphatics: Denies new lymphadenopathy or easy bruising Neurological:Denies numbness, tingling or new weaknesses Behavioral/Psych: Mood is stable, no new changes  All other systems were reviewed with the patient and are negative.  PHYSICAL EXAMINATION:   Vitals:   07/14/23 1012  BP: 112/67  Pulse: 99  Resp: 16  Temp:  98.1 F (36.7 C)  SpO2: 100%    GENERAL: alert, no distress and comfortable LUNGS: clear to auscultation and percussion with normal breathing effort HEART: regular rate & rhythm and no murmurs and no lower extremity edema ABDOMEN:abdomen soft, non-tender and normal bowel sounds Musculoskeletal:no cyanosis of digits and no clubbing  NEURO: alert & oriented x 3 with fluent speech  LABORATORY DATA:  I have reviewed the data as listed  Lab Results  Component Value Date   WBC 7.7 05/27/2023   NEUTROABS 4.5 01/25/2023   HGB 10.4 (L) 05/27/2023   HCT 32.5 (L) 05/27/2023   MCV 93.9 05/27/2023   PLT 200 05/27/2023      Component Value Date/Time  NA 136 06/15/2023 1058   NA 135 04/07/2021 0853   K 4.1 06/15/2023 1058   CL 99 06/15/2023 1058   CO2 27 06/15/2023 1058   GLUCOSE 181 (H) 06/15/2023 1058   BUN 50 (H) 06/15/2023 1058   BUN 28 (H) 04/07/2021 0853   CREATININE 3.00 (H) 06/15/2023 1058   CALCIUM 9.1 06/15/2023 1058   PROT 6.6 01/25/2023 1522   PROT 6.7 03/28/2021 0831   ALBUMIN 3.3 (L) 01/25/2023 1522   ALBUMIN 4.3 03/28/2021 0831   AST 14 (L) 01/25/2023 1522   ALT 11 01/25/2023 1522   ALKPHOS 64 01/25/2023 1522   BILITOT 0.4 01/25/2023 1522   BILITOT 0.2 03/28/2021 0831   GFRNONAA 17 (L) 06/15/2023 1058   GFRAA 36 (L) 05/07/2020 0836       Chemistry      Component Value Date/Time   NA 136 06/15/2023 1058   NA 135 04/07/2021 0853   K 4.1 06/15/2023 1058   CL 99 06/15/2023 1058   CO2 27 06/15/2023 1058   BUN 50 (H) 06/15/2023 1058   BUN 28 (H) 04/07/2021 0853   CREATININE 3.00 (H) 06/15/2023 1058      Component Value Date/Time   CALCIUM 9.1 06/15/2023 1058   ALKPHOS 64 01/25/2023 1522   AST 14 (L) 01/25/2023 1522   ALT 11 01/25/2023 1522   BILITOT 0.4 01/25/2023 1522   BILITOT 0.2 03/28/2021 0831     Lab Results  Component Value Date   IRON 56 06/11/2023   TIBC 311 06/11/2023   FERRITIN 61 06/11/2023      RADIOGRAPHIC STUDIES: ECHO:  05/28/23: 1. Left ventricular ejection fraction, by estimation, is 60 to 65% . The left ventricle has normal function. The left ventricle has no regional wall motion abnormalities. Left ventricular diastolic parameters are consistent with Grade I diastolic dysfunction ( impaired relaxation) . 2. Right ventricular systolic function is normal. The right ventricular size is normal. There is normal pulmonary artery systolic pressure. The estimated right ventricular systolic pressure is 26. 0 mmHg. 3. The mitral valve is grossly normal. Trivial mitral valve regurgitation. 4. The aortic valve is tricuspid. Aortic valve regurgitation is not visualized. Aortic valve sclerosis/ calcification is present, without any evidence of aortic stenosis. Aortic valve mean gradient measures 8. 0 mmHg. 5. The inferior vena cava is normal in size with greater than 50% respiratory variability, suggesting right atrial pressure of 3 mmHg.   ASSESSMENT & PLAN:  Meghan Terry is a 62 year old female with past medical history of CKD, heart failure with preserved ejection fraction referred by her cardiologist for IV iron   Acute on chronic diastolic (congestive) heart failure (HCC) Frequent hospitalizations (6 times this year). Shortness of breath with exertion, but not at rest (NYHA II). Ejection fraction 60-65%. Iron deficiency (ferritin 61, iron saturation 18%) may be contributing to symptoms.   -Discussed recent studies showing benefit of IV iron in CHF patients. Carola Rhine, Renae Fickle R., et al. "Intravenous ferric derisomaltose in patients with heart failure and iron deficiency in the [place] [facility]): an investigator-initiated, prospective, randomised, open-label, blinded-endpoint trial." The Lancet 161.09604 (2022): 2199-2209. , Belva Chimes., et al. "Ferric carboxymaltose in heart failure with iron deficiency." New England Journal of Medicine 389.11 (2023): [phone]. ) -We discussed some of the risks, benefits, and alternatives of intravenous  iron infusions. The patient is symptomatic from heart failure, requiring multiple hospital admissions. Some of the side-effects to be expected including risks of infusion reactions, phlebitis, headaches, nausea and fatigue.  The patient is willing to proceed. Patient education material was dispensed.  Goal is to keep ferritin level greater than 100 and Tsat >20  -Plan to administer IV iron infusion every 6 months, pending insurance approval.  Only ferric carboxymaltose and ferric derisomaltose were studied in this setting to lower the risk of hospital admissions and cardiovascular death -Continue current CHF management with Dr. Jenene Slicker and DR.Sabaharwal -Follow-up every 3 months with labs  Anemia Likely secondary to iron deficiency and chronic kidney disease -Currently taking oral iron twice daily. Discussed benefits of reducing frequency to every other day to improve absorption and reduce GI side effects. -Reduce oral iron to one tablet every other day. Avoid taking with caffeine or calcium. Can take with orange juice to enhance absorption. -Continue current management with Dr. Gracelyn Nurse. -Open to taking over EPO shots if needed in the future. -Check labs before next visit in 3 months.  Insomnia Reports difficulty staying asleep. No clear cause identified. Currently taking 200mg  of trazodone for depression, but not helping with sleep. Likely a component of depression. -No changes to management discussed in this visit. -Continue to follow-up with psychiatry.   Orders Placed This Encounter  Procedures   CBC with Differential/Platelet    Standing Status:   Future    Standing Expiration Date:   07/13/2024   Comprehensive metabolic panel    Standing Status:   Future    Standing Expiration Date:   07/13/2024   Ferritin    Standing Status:   Future    Standing Expiration Date:   07/13/2024   Iron and TIBC    Standing Status:   Future    Standing Expiration Date:   07/13/2024    The  total time spent in the appointment was 30 minutes encounter with patients including review of chart and various tests results, discussions about plan of care and coordination of care plan   All questions were answered. The patient knows to call the clinic with any problems, questions or concerns. No barriers to learning was detected.   Cindie Crumbly, MD 10/16/202411:08 AM

## 2023-07-14 NOTE — Assessment & Plan Note (Addendum)
Frequent hospitalizations (6 times this year). Shortness of breath with exertion, but not at rest (NYHA II). Ejection fraction 60-65%. Iron deficiency (ferritin 61, iron saturation 18%) may be contributing to symptoms.   -Discussed recent studies showing benefit of IV iron in CHF patients. Carola Rhine, Renae Fickle R., et al. "Intravenous ferric derisomaltose in patients with heart failure and iron deficiency in the [place] [facility]): an investigator-initiated, prospective, randomised, open-label, blinded-endpoint trial." The Lancet 657.84696 (2022): 2199-2209. , Belva Chimes., et al. "Ferric carboxymaltose in heart failure with iron deficiency." New England Journal of Medicine 389.11 (2023): [phone]. ) -We discussed some of the risks, benefits, and alternatives of intravenous iron infusions. The patient is symptomatic from heart failure, requiring multiple hospital admissions. Some of the side-effects to be expected including risks of infusion reactions, phlebitis, headaches, nausea and fatigue.  The patient is willing to proceed. Patient education material was dispensed.  Goal is to keep ferritin level greater than 100 and Tsat >20  -Plan to administer IV iron infusion every 6 months, pending insurance approval.  Only ferric carboxymaltose and ferric derisomaltose were studied in this setting to lower the risk of hospital admissions and cardiovascular death -Continue current CHF management with Dr. Jenene Slicker and DR.Sabaharwal -Follow-up every 3 months with labs

## 2023-07-14 NOTE — H&P (View-Only) (Signed)
[facility] at [facility] HEMATOLOGY NEW VISIT  Benita Stabile, MD  REASON FOR REFERRAL: Heart failure with iron deficiency   HISTORY OF PRESENT ILLNESS: Ollen Bowl 62 y.o. female referred by cardiologist Dr. Jenene Slicker for heart failure with iron deficiency.  She has a past medical history of CKD, diabetes, heart failure with preserved ejection fraction, depression, hyperlipidemia.  Patient stated that she had 6 admissions to the hospital this year for heart failure.She experiences constant shortness of breath, which is worse with exertion but improves with rest.  She also reports constant chest pain and fatigue.She also reports difficulty sleeping, waking up multiple times during the night and struggling to fall back asleep.  She has been taking trazodone and Wellbutrin with no improvement.  She has no other complaints.  She denies fever, chills, abdominal pain, blood in stools, melena.  She has CKD and follows with Dr. Wolfgang Phoenix, gets EPO shots as needed whenever her hemoglobin is less than 10 with him.  The patient has a history of smoking but quit in 2020. They also have a family history of heart disease and diabetes. The patient is currently living with their daughter due to frequent falls.  I have reviewed the past medical history, past surgical history, social history and family history with the patient   ALLERGIES:  is allergic to abilify [aripiprazole], ciprofloxacin, carbidopa-levodopa, prednisone, and venlafaxine.  MEDICATIONS:  Current Outpatient Medications  Medication Sig Dispense Refill   acetaminophen (TYLENOL) 500 MG tablet Take 2 tablets by mouth 3 times daily as needed for pain 100 tablet 0   aspirin EC 81 MG tablet Take 1 tablet (81 mg total) by mouth daily. 90 tablet 3   buPROPion (WELLBUTRIN XL) 300 MG 24 hr tablet Take 1 tablet (300 mg total) by mouth daily. 90 tablet 2   cholecalciferol 25 MCG (1000 UT) tablet Take 1 tablet (1,000 Units total)  by mouth in the morning and at bedtime.     clopidogrel (PLAVIX) 75 MG tablet TAKE 1 TABLET BY MOUTH DAILY 90 tablet 2   colchicine 0.6 MG tablet Take 1 tablet (0.6 mg total) by mouth 3 (three) times a week. For 2 weeks. 6 tablet 0   cyanocobalamin (VITAMIN B12) 1000 MCG tablet Take 1 tablet (1,000 mcg total) by mouth daily. 30 tablet 1   dapagliflozin propanediol (FARXIGA) 10 MG TABS tablet Take 1 tablet (10 mg total) by mouth daily before breakfast. Hold medication until follow-up with cardiology service.     ferrous sulfate 325 (65 FE) MG EC tablet Take 325 mg by mouth 2 (two) times daily.     folic acid (FOLVITE) 1 MG tablet Take 1 tablet (1 mg total) by mouth daily. 30 tablet 1   gabapentin (NEURONTIN) 100 MG capsule Take 3 capsules (300 mg total) by mouth at bedtime. (Patient taking differently: Take 100 mg by mouth 3 (three) times daily.)     gatifloxacin (ZYMAXID) 0.5 % SOLN Place 1 drop into the left eye 4 (four) times daily.     isosorbide mononitrate (IMDUR) 30 MG 24 hr tablet Take 1 tablet (30 mg total) by mouth daily. 90 tablet 3   ketorolac (ACULAR) 0.5 % ophthalmic solution Place 1 drop into the left eye 4 (four) times daily.     levothyroxine (SYNTHROID) 50 MCG tablet Take 1 tablet (50 mcg total) by mouth See admin instructions. Take 1 tablet in the morning with breakfast on SATURDAY AND SUNDAY.     levothyroxine (  SYNTHROID) 75 MCG tablet Take 1 tablet (75 mcg total) by mouth daily Monday thru Friday. 60 tablet 3   linaclotide (LINZESS) 145 MCG CAPS capsule Take 145 mcg by mouth as needed (diarrhea).     metolazone (ZAROXOLYN) 5 MG tablet Once a week on Wednesdays if you gain weight or swelling appreciated. (Patient taking differently: Take 5 mg by mouth once a week. Wednesday) 12 tablet 1   midodrine (PROAMATINE) 5 MG tablet Take 1 tablet (5 mg total) by mouth 3 (three) times daily with meals. 270 tablet 3   pantoprazole (PROTONIX) 40 MG tablet Take 1 tablet (40 mg total) by mouth 2  (two) times daily. 60 tablet 1   potassium chloride SA (KLOR-CON M) 20 MEQ tablet Take 40 mEq by mouth 2 (two) times daily.     promethazine (PHENERGAN) 25 MG tablet Take 1 tablet (25 mg total) by mouth every 6 (six) hours as needed for nausea or vomiting.     rOPINIRole (REQUIP) 4 MG tablet Take 1 tablet (4 mg total) by mouth 1 to 3 hours before bedtime. 90 tablet 0   rosuvastatin (CRESTOR) 5 MG tablet Take 1 tablet (5 mg total) by mouth at bedtime. 30 tablet 1   topiramate (TOPAMAX) 100 MG tablet TAKE 1 TABLET BY MOUTH TWICE DAILY. 180 tablet 3   torsemide (DEMADEX) 20 MG tablet Take 60 mg by mouth 2 (two) times daily.     traMADol (ULTRAM) 50 MG tablet Take 1 tablet (50 mg total) by mouth daily as needed for moderate pain.     traZODone (DESYREL) 150 MG tablet Take 150 mg by mouth at bedtime.     traZODone (DESYREL) 50 MG tablet Take 50 mg by mouth at bedtime.     nitroGLYCERIN (NITROSTAT) 0.4 MG SL tablet Place 1 tablet (0.4 mg total) under the tongue every 5 (five) minutes x 3 doses as needed for chest pain. (Patient not taking: Reported on 07/14/2023) 25 tablet 7   ondansetron (ZOFRAN) 4 MG tablet take 1 tablet (4 mg) by mouth 2 times per day as needed for nausea (Patient not taking: Reported on 07/14/2023) 180 tablet 1   No current facility-administered medications for this visit.     REVIEW OF SYSTEMS:   Constitutional: Denies fevers, chills or night sweats Eyes: Denies blurriness of vision Ears, nose, mouth, throat, and face: Denies mucositis or sore throat Gastrointestinal:  Denies nausea, heartburn or change in bowel habits Skin: Denies abnormal skin rashes Lymphatics: Denies new lymphadenopathy or easy bruising Neurological:Denies numbness, tingling or new weaknesses Behavioral/Psych: Mood is stable, no new changes  All other systems were reviewed with the patient and are negative.  PHYSICAL EXAMINATION:   Vitals:   07/14/23 1012  BP: 112/67  Pulse: 99  Resp: 16  Temp:  98.1 F (36.7 C)  SpO2: 100%    GENERAL: alert, no distress and comfortable LUNGS: clear to auscultation and percussion with normal breathing effort HEART: regular rate & rhythm and no murmurs and no lower extremity edema ABDOMEN:abdomen soft, non-tender and normal bowel sounds Musculoskeletal:no cyanosis of digits and no clubbing  NEURO: alert & oriented x 3 with fluent speech  LABORATORY DATA:  I have reviewed the data as listed  Lab Results  Component Value Date   WBC 7.7 05/27/2023   NEUTROABS 4.5 01/25/2023   HGB 10.4 (L) 05/27/2023   HCT 32.5 (L) 05/27/2023   MCV 93.9 05/27/2023   PLT 200 05/27/2023      Component Value Date/Time  NA 136 06/15/2023 1058   NA 135 04/07/2021 0853   K 4.1 06/15/2023 1058   CL 99 06/15/2023 1058   CO2 27 06/15/2023 1058   GLUCOSE 181 (H) 06/15/2023 1058   BUN 50 (H) 06/15/2023 1058   BUN 28 (H) 04/07/2021 0853   CREATININE 3.00 (H) 06/15/2023 1058   CALCIUM 9.1 06/15/2023 1058   PROT 6.6 01/25/2023 1522   PROT 6.7 03/28/2021 0831   ALBUMIN 3.3 (L) 01/25/2023 1522   ALBUMIN 4.3 03/28/2021 0831   AST 14 (L) 01/25/2023 1522   ALT 11 01/25/2023 1522   ALKPHOS 64 01/25/2023 1522   BILITOT 0.4 01/25/2023 1522   BILITOT 0.2 03/28/2021 0831   GFRNONAA 17 (L) 06/15/2023 1058   GFRAA 36 (L) 05/07/2020 0836       Chemistry      Component Value Date/Time   NA 136 06/15/2023 1058   NA 135 04/07/2021 0853   K 4.1 06/15/2023 1058   CL 99 06/15/2023 1058   CO2 27 06/15/2023 1058   BUN 50 (H) 06/15/2023 1058   BUN 28 (H) 04/07/2021 0853   CREATININE 3.00 (H) 06/15/2023 1058      Component Value Date/Time   CALCIUM 9.1 06/15/2023 1058   ALKPHOS 64 01/25/2023 1522   AST 14 (L) 01/25/2023 1522   ALT 11 01/25/2023 1522   BILITOT 0.4 01/25/2023 1522   BILITOT 0.2 03/28/2021 0831     Lab Results  Component Value Date   IRON 56 06/11/2023   TIBC 311 06/11/2023   FERRITIN 61 06/11/2023      RADIOGRAPHIC STUDIES: ECHO:  05/28/23: 1. Left ventricular ejection fraction, by estimation, is 60 to 65% . The left ventricle has normal function. The left ventricle has no regional wall motion abnormalities. Left ventricular diastolic parameters are consistent with Grade I diastolic dysfunction ( impaired relaxation) . 2. Right ventricular systolic function is normal. The right ventricular size is normal. There is normal pulmonary artery systolic pressure. The estimated right ventricular systolic pressure is 26. 0 mmHg. 3. The mitral valve is grossly normal. Trivial mitral valve regurgitation. 4. The aortic valve is tricuspid. Aortic valve regurgitation is not visualized. Aortic valve sclerosis/ calcification is present, without any evidence of aortic stenosis. Aortic valve mean gradient measures 8. 0 mmHg. 5. The inferior vena cava is normal in size with greater than 50% respiratory variability, suggesting right atrial pressure of 3 mmHg.   ASSESSMENT & PLAN:  Ms. Alleyne is a 62 year old female with past medical history of CKD, heart failure with preserved ejection fraction referred by her cardiologist for IV iron   Acute on chronic diastolic (congestive) heart failure (HCC) Frequent hospitalizations (6 times this year). Shortness of breath with exertion, but not at rest (NYHA II). Ejection fraction 60-65%. Iron deficiency (ferritin 61, iron saturation 18%) may be contributing to symptoms.   -Discussed recent studies showing benefit of IV iron in CHF patients. Carola Rhine, Renae Fickle R., et al. "Intravenous ferric derisomaltose in patients with heart failure and iron deficiency in the [facility]): an investigator-initiated, prospective, randomised, open-label, blinded-endpoint trial." The Lancet 161.09604 (2022): 2199-2209. , Belva Chimes., et al. "Ferric carboxymaltose in heart failure with iron deficiency." New England Journal of Medicine 389.11 (2023): 650 152 3751. ) -We discussed some of the risks, benefits, and alternatives of intravenous  iron infusions. The patient is symptomatic from heart failure, requiring multiple hospital admissions. Some of the side-effects to be expected including risks of infusion reactions, phlebitis, headaches, nausea and fatigue.  The patient is willing to proceed. Patient education material was dispensed.  Goal is to keep ferritin level greater than 100 and Tsat >20  -Plan to administer IV iron infusion every 6 months, pending insurance approval.  Only ferric carboxymaltose and ferric derisomaltose were studied in this setting to lower the risk of hospital admissions and cardiovascular death -Continue current CHF management with Dr. Jenene Slicker and DR.Sabaharwal -Follow-up every 3 months with labs  Anemia Likely secondary to iron deficiency and chronic kidney disease -Currently taking oral iron twice daily. Discussed benefits of reducing frequency to every other day to improve absorption and reduce GI side effects. -Reduce oral iron to one tablet every other day. Avoid taking with caffeine or calcium. Can take with orange juice to enhance absorption. -Continue current management with Dr. Gracelyn Nurse. -Open to taking over EPO shots if needed in the future. -Check labs before next visit in 3 months.  Insomnia Reports difficulty staying asleep. No clear cause identified. Currently taking 200mg  of trazodone for depression, but not helping with sleep. Likely a component of depression. -No changes to management discussed in this visit. -Continue to follow-up with psychiatry.   Orders Placed This Encounter  Procedures   CBC with Differential/Platelet    Standing Status:   Future    Standing Expiration Date:   07/13/2024   Comprehensive metabolic panel    Standing Status:   Future    Standing Expiration Date:   07/13/2024   Ferritin    Standing Status:   Future    Standing Expiration Date:   07/13/2024   Iron and TIBC    Standing Status:   Future    Standing Expiration Date:   07/13/2024    The  total time spent in the appointment was 30 minutes encounter with patients including review of chart and various tests results, discussions about plan of care and coordination of care plan   All questions were answered. The patient knows to call the clinic with any problems, questions or concerns. No barriers to learning was detected.   Cindie Crumbly, MD 10/16/202411:08 AM

## 2023-07-16 ENCOUNTER — Telehealth (HOSPITAL_COMMUNITY): Payer: Self-pay | Admitting: Cardiology

## 2023-07-16 ENCOUNTER — Encounter (HOSPITAL_COMMUNITY): Payer: Self-pay | Admitting: Cardiology

## 2023-07-16 NOTE — Telephone Encounter (Signed)
Cardio mems approval Cpt [id number]  [phone] Valid 07/06/23-10/04/23 [name] #[id number]  Sabharwal Bensimhon to assist    [facility] [address] [place] [address] Dept: [phone] Loc: [phone]  Meghan Terry  07/16/2023  You are scheduled for a Cardiac Catheterization/CardioMems Implant on Monday, November 11 with Dr. Reuel Boom Bensimhon/Adi Sabharwal.  1. Please arrive at the [facility] (Main Entrance A) at [facility]: [address] [place], [place] [address] at 5:30 AM (This time is 2 hour(s) before your procedure to ensure your preparation). Free valet parking service is available. You will check in at ADMITTING. The support person will be asked to wait in the waiting room.  It is OK to have someone drop you off and come back when you are ready to be discharged.    Special note: Every effort is made to have your procedure done on time. Please understand that emergencies sometimes delay scheduled procedures.  2. Diet: Do not eat solid foods after midnight.  The patient may have clear liquids until 5am upon the day of the procedure.  3. Labs: labs done on 07/14/23, no additional labs are needed at this time. Should your provider need any additional labs they will be drawn in the two hour window of your arrival.  4. Medication instructions in preparation for your procedure:   Contrast Allergy: No      Stop taking, Torsemide (Demadex) Monday, November 11,    Hold Farxiga 72 hours prior to your procedure, starting  Friday, November 8,  On the morning of your procedure, take your Plavix/Clopidogrel and any morning medicines NOT listed above.  You may use sips of water.  5. Plan to go home the same day, you will only stay overnight if medically necessary. 6. Bring a current list of your medications and current insurance cards. 7. You MUST have a responsible person to drive you home and  participate in the education session.  8. You will receive patient education and necessary resources for transmissions (special pillow and antennae) prior to leaving 9. Someone MUST be with you the first 24 hours after you arrive home or your discharge will be delayed. 10. Please wear clothes that are easy to get on and off and wear slip-on shoes.     Once you are at home  Lay on the pillow and send a transmission every morning Make sure to take your Plavix 75 mg daily for one month and Aspirin _____mg daily for life Our office will call you weekly for the first 3 weeks You will be seen for a follow up appointment on ____as scheduled _____ You will receive periodic phone calls either from our office or automated system You will receive a bill from our office once a month   Thank you for allowing [place] to care for you!   -- [place]

## 2023-07-21 ENCOUNTER — Other Ambulatory Visit: Payer: Self-pay

## 2023-07-21 ENCOUNTER — Inpatient Hospital Stay: Payer: Medicare Other

## 2023-07-21 VITALS — BP 149/64 | HR 93 | Temp 97.9°F | Resp 18

## 2023-07-21 DIAGNOSIS — E611 Iron deficiency: Secondary | ICD-10-CM | POA: Diagnosis not present

## 2023-07-21 DIAGNOSIS — N189 Chronic kidney disease, unspecified: Secondary | ICD-10-CM | POA: Diagnosis not present

## 2023-07-21 DIAGNOSIS — I5033 Acute on chronic diastolic (congestive) heart failure: Secondary | ICD-10-CM

## 2023-07-21 DIAGNOSIS — E1122 Type 2 diabetes mellitus with diabetic chronic kidney disease: Secondary | ICD-10-CM | POA: Diagnosis not present

## 2023-07-21 MED ORDER — CETIRIZINE HCL 10 MG PO TABS
10.0000 mg | ORAL_TABLET | Freq: Once | ORAL | Status: AC
  Administered 2023-07-21: 10 mg via ORAL
  Filled 2023-07-21: qty 1

## 2023-07-21 MED ORDER — SODIUM CHLORIDE 0.9% FLUSH: 10.0000 mL | Freq: Two times a day (BID) | INTRAVENOUS | Status: DC

## 2023-07-21 MED ORDER — SODIUM CHLORIDE 0.9 % IV SOLN
750.0000 mg | Freq: Once | INTRAVENOUS | Status: AC
  Administered 2023-07-21: 750 mg via INTRAVENOUS
  Filled 2023-07-21: qty 15

## 2023-07-21 MED ORDER — SODIUM CHLORIDE 0.9 % IV SOLN: INTRAVENOUS | Status: DC

## 2023-07-21 MED ORDER — ACETAMINOPHEN 325 MG PO TABS
650.0000 mg | ORAL_TABLET | Freq: Once | ORAL | Status: AC
  Administered 2023-07-21: 650 mg via ORAL
  Filled 2023-07-21: qty 2

## 2023-07-21 NOTE — Patient Instructions (Signed)
[facility]  Discharge Instructions: Thank you for choosing [facility] to provide your oncology and hematology care.  If you have a lab appointment with the Cancer Center - please note that after April 8th, 2024, all labs will be drawn in the cancer center.  You do not have to check in or register with the main entrance as you have in the past but will complete your check-in in the cancer center.  Wear comfortable clothing and clothing appropriate for easy access to any Portacath or PICC line.   We strive to give you quality time with your provider. You may need to reschedule your appointment if you arrive late (15 or more minutes).  Arriving late affects you and other patients whose appointments are after yours.  Also, if you miss three or more appointments without notifying the office, you may be dismissed from the clinic at the provider's discretion.      For prescription refill requests, have your pharmacy contact our office and allow 72 hours for refills to be completed.    Today you received the following: Injectafer.  Ferric Carboxymaltose Injection What is this medication? FERRIC CARBOXYMALTOSE (FER ik kar BOX ee MAWL tose) treats low levels of iron in your body (iron deficiency anemia). Iron is a mineral that plays an important role in making red blood cells, which carry oxygen from your lungs to the rest of your body. This medicine may be used for other purposes; ask your health care provider or pharmacist if you have questions. COMMON BRAND NAME(S): Injectafer What should I tell my care team before I take this medication? They need to know if you have any of these conditions: High blood pressure Hyperparathyroidism Inflammatory bowel disease Low levels of vitamin D in your blood Previously received ferric carboxymaltose Problems absorbing certain vitamins or phosphate in your body An unusual or allergic reaction to iron, other medications,  foods, dyes, or preservatives Pregnant or trying to get pregnant Breastfeeding How should I use this medication? This medication is injected into a vein. It is given by your care team in a hospital or clinic setting. Talk to your care team about the use of this medication in children. While it may be given to children as young as 1 year for selected conditions, precautions do apply. Overdosage: If you think you have taken too much of this medicine contact a poison control center or emergency room at once. NOTE: This medicine is only for you. Do not share this medicine with others. What if I miss a dose? Keep appointments for follow-up doses. It is important not to miss your dose. Call your care team if you are unable to keep an appointment. What may interact with this medication? Do not take this medication with any of the following: Deferoxamine Dimercaprol Other iron products This list may not describe all possible interactions. Give your health care provider a list of all the medicines, herbs, non-prescription drugs, or dietary supplements you use. Also tell them if you smoke, drink alcohol, or use illegal drugs. Some items may interact with your medicine. What should I watch for while using this medication? Visit your care team for regular checks on your progress. Tell your care team if your symptoms do not start to get better or if they get worse. You may need blood work while you are taking this medication. You may need to eat more foods that contain iron. Talk to your care team. Foods that contain iron  include whole grains/cereals, dried fruits, beans, or peas, leafy green vegetables, and organ meats (liver, kidney). What side effects may I notice from receiving this medication? Side effects that you should report to your care team as soon as possible: Allergic reactions--skin rash, itching, hives, swelling of the face, lips, tongue, or throat Increase in blood pressure Low blood  pressure--dizziness, feeling faint or lightheaded, blurry vision Low phosphorus level--fatigue, muscle weakness or pain, bone or joint pain, bone fractures Shortness of breath Side effects that usually do not require medical attention (report to your care team if they continue or are bothersome): Flushing Headache Nausea Pain, redness, or irritation at injection site Vomiting This list may not describe all possible side effects. Call your doctor for medical advice about side effects. You may report side effects to FDA at 1-800-FDA-1088. Where should I keep my medication? This medication is given in a hospital or clinic. It will not be stored at home. NOTE: This sheet is a summary. It may not cover all possible information. If you have questions about this medicine, talk to your doctor, pharmacist, or health care provider.  2024 Elsevier/Gold Standard (2022-03-23 00:00:00)     To help prevent nausea and vomiting after your treatment, we encourage you to take your nausea medication as directed.  BELOW ARE SYMPTOMS THAT SHOULD BE REPORTED IMMEDIATELY: *FEVER GREATER THAN 100.4 F (38 C) OR HIGHER *CHILLS OR SWEATING *NAUSEA AND VOMITING THAT IS NOT CONTROLLED WITH YOUR NAUSEA MEDICATION *UNUSUAL SHORTNESS OF BREATH *UNUSUAL BRUISING OR BLEEDING *URINARY PROBLEMS (pain or burning when urinating, or frequent urination) *BOWEL PROBLEMS (unusual diarrhea, constipation, pain near the anus) TENDERNESS IN MOUTH AND THROAT WITH OR WITHOUT PRESENCE OF ULCERS (sore throat, sores in mouth, or a toothache) UNUSUAL RASH, SWELLING OR PAIN  UNUSUAL VAGINAL DISCHARGE OR ITCHING   Items with * indicate a potential emergency and should be followed up as soon as possible or go to the Emergency Department if any problems should occur.  Please show the CHEMOTHERAPY ALERT CARD or IMMUNOTHERAPY ALERT CARD at check-in to the Emergency Department and triage nurse.  Should you have questions after your visit  or need to cancel or reschedule your appointment, please contact [facility] [phone]  and follow the prompts.  Office hours are 8:00 a.m. to 4:30 p.m. Monday - Friday. Please note that voicemails left after 4:00 p.m. may not be returned until the following business day.  We are closed weekends and major holidays. You have access to a nurse at all times for urgent questions. Please call the main number to the clinic [phone] and follow the prompts.  For any non-urgent questions, you may also contact your provider using MyChart. We now offer e-Visits for anyone 27 and older to request care online for non-urgent symptoms. For details visit [email or web address].   Also download the MyChart app! Go to the app store, search "MyChart", open the app, select [place], and log in with your MyChart username and password.

## 2023-07-21 NOTE — Progress Notes (Signed)
Patient presents today for iron infusion.  Patient is in satisfactory condition with no new complaints voiced.  Vital signs are stable.  IV placed in L arm.  IV flushed well with good blood return noted.   We will proceed with infusion per provider orders.    Patient tolerated infusion well with no complaints voiced.  Patient left ambulatory in stable condition.  Vital signs stable at discharge.  Follow up as scheduled.    

## 2023-08-05 DIAGNOSIS — N189 Chronic kidney disease, unspecified: Secondary | ICD-10-CM | POA: Diagnosis not present

## 2023-08-05 DIAGNOSIS — E1122 Type 2 diabetes mellitus with diabetic chronic kidney disease: Secondary | ICD-10-CM | POA: Diagnosis not present

## 2023-08-05 DIAGNOSIS — I129 Hypertensive chronic kidney disease with stage 1 through stage 4 chronic kidney disease, or unspecified chronic kidney disease: Secondary | ICD-10-CM | POA: Diagnosis not present

## 2023-08-05 DIAGNOSIS — I5032 Chronic diastolic (congestive) heart failure: Secondary | ICD-10-CM | POA: Diagnosis not present

## 2023-08-05 DIAGNOSIS — N184 Chronic kidney disease, stage 4 (severe): Secondary | ICD-10-CM | POA: Diagnosis not present

## 2023-08-06 ENCOUNTER — Other Ambulatory Visit: Payer: Self-pay

## 2023-08-06 ENCOUNTER — Emergency Department (HOSPITAL_COMMUNITY)
Admission: EM | Admit: 2023-08-06 | Discharge: 2023-08-07 | Disposition: A | Discharge status: Home / Self Care | Payer: Medicare Other | Attending: Emergency Medicine | Admitting: Emergency Medicine

## 2023-08-06 ENCOUNTER — Encounter (HOSPITAL_COMMUNITY): Payer: Self-pay

## 2023-08-06 DIAGNOSIS — Z7982 Long term (current) use of aspirin: Secondary | ICD-10-CM | POA: Diagnosis not present

## 2023-08-06 DIAGNOSIS — Z7901 Long term (current) use of anticoagulants: Secondary | ICD-10-CM | POA: Diagnosis not present

## 2023-08-06 DIAGNOSIS — E1165 Type 2 diabetes mellitus with hyperglycemia: Secondary | ICD-10-CM | POA: Diagnosis not present

## 2023-08-06 DIAGNOSIS — I2510 Atherosclerotic heart disease of native coronary artery without angina pectoris: Secondary | ICD-10-CM | POA: Insufficient documentation

## 2023-08-06 DIAGNOSIS — I130 Hypertensive heart and chronic kidney disease with heart failure and stage 1 through stage 4 chronic kidney disease, or unspecified chronic kidney disease: Secondary | ICD-10-CM | POA: Diagnosis not present

## 2023-08-06 DIAGNOSIS — Z87891 Personal history of nicotine dependence: Secondary | ICD-10-CM | POA: Diagnosis not present

## 2023-08-06 DIAGNOSIS — E039 Hypothyroidism, unspecified: Secondary | ICD-10-CM | POA: Insufficient documentation

## 2023-08-06 DIAGNOSIS — N184 Chronic kidney disease, stage 4 (severe): Secondary | ICD-10-CM | POA: Insufficient documentation

## 2023-08-06 DIAGNOSIS — R739 Hyperglycemia, unspecified: Secondary | ICD-10-CM

## 2023-08-06 DIAGNOSIS — E1122 Type 2 diabetes mellitus with diabetic chronic kidney disease: Secondary | ICD-10-CM | POA: Diagnosis not present

## 2023-08-06 DIAGNOSIS — I509 Heart failure, unspecified: Secondary | ICD-10-CM | POA: Diagnosis not present

## 2023-08-06 LAB — CBG MONITORING, ED
Glucose-Capillary: 135 mg/dL — ABNORMAL HIGH (ref 70–99)
Glucose-Capillary: 139 mg/dL — ABNORMAL HIGH (ref 70–99)
Glucose-Capillary: 386 mg/dL — ABNORMAL HIGH (ref 70–99)
Glucose-Capillary: >600 mg/dL (ref 70–99)

## 2023-08-06 LAB — URINALYSIS, ROUTINE W REFLEX MICROSCOPIC
Bilirubin Urine: NEGATIVE
Glucose, UA: >=500 mg/dL — AB
Hgb urine dipstick: NEGATIVE
Ketones, ur: NEGATIVE mg/dL
Leukocytes,Ua: NEGATIVE
Nitrite: NEGATIVE
Protein, ur: NEGATIVE mg/dL
Specific Gravity, Urine: 1.007 (ref 1.005–1.030)
pH: 7 (ref 5.0–8.0)

## 2023-08-06 LAB — BETA-HYDROXYBUTYRIC ACID: Beta-Hydroxybutyric Acid: 0.08 mmol/L (ref 0.05–0.27)

## 2023-08-06 LAB — BLOOD GAS, VENOUS
Acid-Base Excess: 4.7 mmol/L — ABNORMAL HIGH (ref 0.0–2.0)
Bicarbonate: 30.9 mmol/L — ABNORMAL HIGH (ref 20.0–28.0)
Drawn by: 27160
O2 Saturation: 24.4 %
Patient temperature: 37.4
pCO2, Ven: 52 mm[Hg] (ref 44–60)
pH, Ven: 7.38 (ref 7.25–7.43)
pO2, Ven: <31 mm[Hg] — CL (ref 32–45)

## 2023-08-06 LAB — CBC WITH DIFFERENTIAL/PLATELET
Abs Immature Granulocytes: 0.1 10*3/uL — ABNORMAL HIGH (ref 0.00–0.07)
Basophils Absolute: 0.1 10*3/uL (ref 0.0–0.1)
Basophils Relative: 1 %
Eosinophils Absolute: 0.2 10*3/uL (ref 0.0–0.5)
Eosinophils Relative: 2 %
HCT: 34.1 % — ABNORMAL LOW (ref 36.0–46.0)
Hemoglobin: 12 g/dL (ref 12.0–15.0)
Immature Granulocytes: 1 %
Lymphocytes Relative: 18 %
Lymphs Abs: 1.8 10*3/uL (ref 0.7–4.0)
MCH: 30.8 pg (ref 26.0–34.0)
MCHC: 35.2 g/dL (ref 30.0–36.0)
MCV: 87.7 fL (ref 80.0–100.0)
Monocytes Absolute: 0.5 10*3/uL (ref 0.1–1.0)
Monocytes Relative: 5 %
Neutro Abs: 7.2 10*3/uL (ref 1.7–7.7)
Neutrophils Relative %: 73 %
Platelets: 214 10*3/uL (ref 150–400)
RBC: 3.89 MIL/uL (ref 3.87–5.11)
RDW: 12.5 % (ref 11.5–15.5)
WBC: 9.9 10*3/uL (ref 4.0–10.5)
nRBC: 0 % (ref 0.0–0.2)

## 2023-08-06 LAB — COMPREHENSIVE METABOLIC PANEL
ALT: 19 U/L (ref 0–44)
AST: 19 U/L (ref 15–41)
Albumin: 4 g/dL (ref 3.5–5.0)
Alkaline Phosphatase: 141 U/L — ABNORMAL HIGH (ref 38–126)
Anion gap: 10 (ref 5–15)
BUN: 41 mg/dL — ABNORMAL HIGH (ref 8–23)
CO2: 26 mmol/L (ref 22–32)
Calcium: 8.7 mg/dL — ABNORMAL LOW (ref 8.9–10.3)
Chloride: 89 mmol/L — ABNORMAL LOW (ref 98–111)
Creatinine, Ser: 2.51 mg/dL — ABNORMAL HIGH (ref 0.44–1.00)
GFR, Estimated: 21 mL/min — ABNORMAL LOW (ref >60)
Glucose, Bld: 595 mg/dL (ref 70–99)
Potassium: 3.6 mmol/L (ref 3.5–5.1)
Sodium: 125 mmol/L — ABNORMAL LOW (ref 135–145)
Total Bilirubin: 0.6 mg/dL (ref <1.2)
Total Protein: 7.3 g/dL (ref 6.5–8.1)

## 2023-08-06 LAB — OSMOLALITY: Osmolality: 309 mosm/kg — ABNORMAL HIGH (ref 275–295)

## 2023-08-06 MED ORDER — PROMETHAZINE HCL 12.5 MG PO TABS
25.0000 mg | ORAL_TABLET | Freq: Once | ORAL | Status: AC
  Administered 2023-08-06: 25 mg via ORAL
  Filled 2023-08-06: qty 2

## 2023-08-06 MED ORDER — CYCLOBENZAPRINE HCL 10 MG PO TABS: 10.0000 mg | ORAL_TABLET | Freq: Two times a day (BID) | ORAL | 0 refills | Status: DC | PRN

## 2023-08-06 MED ORDER — POTASSIUM CHLORIDE CRYS ER 20 MEQ PO TBCR
40.0000 meq | EXTENDED_RELEASE_TABLET | Freq: Once | ORAL | Status: AC
  Administered 2023-08-06: 40 meq via ORAL
  Filled 2023-08-06: qty 2

## 2023-08-06 MED ORDER — LACTATED RINGERS IV BOLUS
1000.0000 mL | Freq: Once | INTRAVENOUS | Status: AC
  Administered 2023-08-06: 1000 mL via INTRAVENOUS

## 2023-08-06 MED ORDER — POTASSIUM CHLORIDE 10 MEQ/100ML IV SOLN
10.0000 meq | Freq: Once | INTRAVENOUS | Status: AC
  Administered 2023-08-06: 10 meq via INTRAVENOUS
  Filled 2023-08-06: qty 100

## 2023-08-06 MED ORDER — INSULIN ASPART 100 UNIT/ML IJ SOLN
15.0000 [IU] | Freq: Once | INTRAMUSCULAR | Status: AC
  Administered 2023-08-06: 15 [IU] via SUBCUTANEOUS
  Filled 2023-08-06: qty 1

## 2023-08-06 NOTE — ED Notes (Signed)
Pt c/o nausea-Dr Gwenette Greet made aware

## 2023-08-06 NOTE — Discharge Instructions (Addendum)
It was a pleasure caring for you today in the emergency department.  Please follow up with PCP next week for recheck and evaluation of your diabetes medications   Please return to the emergency department for any worsening or worrisome symptoms.

## 2023-08-06 NOTE — ED Provider Notes (Signed)
[place] EMERGENCY DEPARTMENT AT [facility] Provider Note  CSN: [id number] Arrival date & time: 08/06/23 1659  Chief Complaint(s) Hyperglycemia  HPI Meghan Terry is a 62 y.o. female with past medical history as below, significant for diastolic heart failure, CKD stage IV, CAD, DM, HLD, HTN, hypothyroid who presents to the ED with complaint of abnormal labs  She was sent by her nephrologist with abnormal metabolic panel.  Metabolic panel yesterday does show hyperglycemia, possible DKA.  Reports that she has been feeling at her baseline for the past few days, she is frequently thirsty but no worse than normal, she has polyuria but not worse than normal given that she is on torsemide.  No abdominal pain nausea or vomiting.  Compliant with home medications, no unexpected weight changes, no vision changes.  Past Medical History Past Medical History:  Diagnosis Date   Acute diastolic CHF (congestive heart failure) (HCC) 06/08/2021   Acute pulmonary edema (HCC)    Anemia due to stage 4 chronic kidney disease (HCC) 07/20/2022   Angina pectoris (HCC) 11/17/2019   Anxiety    Atypical chest pain 12/30/2020   Benign hypertension with CKD (chronic kidney disease) stage IV (HCC) 07/20/2022   CHF (congestive heart failure) (HCC) 06/08/2021   CKD (chronic kidney disease) stage 3b, GFR 30-59 ml/min 12/03/2019   Coronary artery disease    Coronary artery disease involving native coronary artery of native heart with angina pectoris (HCC) 10/03/2019   Depression 04/25/2017   Diabetes mellitus due to underlying condition with unspecified complications (HCC) 09/05/2019   Diabetes mellitus with stage 4 chronic kidney disease GFR 15-29 (HCC) 07/20/2022   Essential hypertension 09/05/2019   Ex-smoker 09/05/2019   Family history of coronary artery disease 09/05/2019   History of depression 04/15/2020   Hx of diabetes mellitus 04/15/2020   Hyperlipidemia with target LDL less than 70 11/24/2019    Hyperphosphatemia 07/20/2022   Hypertension    Hypokalemia 04/30/2022   Hypothyroid    Migraine 04/25/2017   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   Syncope and collapse 04/29/2022   Thyroid disease    UTI (urinary tract infection) 04/30/2022   Patient Active Problem List   Diagnosis Date Noted   Insomnia 07/14/2023   Acute pericarditis 05/29/2023   Anasarca 03/20/2023   Acute on chronic heart failure with preserved ejection fraction (HCC) 03/19/2023   Acute on chronic diastolic CHF (congestive heart failure) (HCC) 02/18/2023   Confusion 02/02/2023   Cognitive impairment 02/02/2023   History of traumatic head injury 02/02/2023   Acute on chronic diastolic (congestive) heart failure (HCC) 01/25/2023   Iron deficiency anemia 12/23/2022   Anemia 12/21/2022   Diabetic nephropathy associated with type 2 diabetes mellitus (HCC) 08/10/2022   Anemia due to stage 4 chronic kidney disease (HCC) 07/20/2022   Hyperphosphatemia 07/20/2022   Prerenal azotemia 07/20/2022   Benign hypertension with CKD (chronic kidney disease) stage IV (HCC) 07/20/2022   Diabetes mellitus with stage 4 chronic kidney disease GFR 15-29 (HCC) 07/20/2022   CKD (chronic kidney disease) stage 4, GFR 15-29 ml/min (HCC) 05/01/2022   UTI (urinary tract infection) 04/30/2022   Hypokalemia 04/30/2022   Acute renal  failure superimposed on stage 4 chronic kidney disease (HCC) 04/30/2022   Syncope and collapse 04/29/2022   Acute diastolic CHF (congestive heart failure) (HCC) 06/08/2021   HFpEF 06/08/2021   Acute pulmonary edema (HCC)    Restless leg syndrome    Anxiety    Coronary artery disease     Hypertension    Thyroid disease    Atypical chest pain 12/30/2020   Restless legs syndrome 04/15/2020   History of depression 04/15/2020   Hx of diabetes mellitus 04/15/2020   CKD (chronic kidney disease) stage 3b, GFR 30-59 ml/min 12/03/2019   HLD (hyperlipidemia) 11/24/2019   Presence of drug coated stent in right coronary artery 11/24/2019   Unstable angina (HCC) 11/17/2019   Coronary artery disease involving native coronary artery of native heart with angina pectoris (HCC) 10/03/2019   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 Authorizing Provider  acetaminophen (TYLENOL) 500 MG tablet Take 2 tablets by mouth 3 times daily as needed for pain 12/08/21     aspirin EC 81 MG tablet Take 1 tablet (81 mg total) by mouth daily. 09/05/19   Revankar, Aundra Dubin, MD  buPROPion (WELLBUTRIN XL) 300 MG 24 hr tablet Take 1 tablet (300 mg total) by mouth daily. 07/06/22     cholecalciferol 25 MCG (1000 UT) tablet Take 1 tablet (1,000 Units total) by mouth in the morning and at bedtime. 12/25/22   Johnson, Clanford L, MD  clopidogrel (PLAVIX) 75 MG tablet TAKE 1 TABLET BY MOUTH DAILY 03/30/22   Revankar, Aundra Dubin, MD  cyanocobalamin (VITAMIN B12) 1000 MCG tablet Take 1 tablet (1,000 mcg total) by mouth daily. 12/25/22   Johnson, Clanford L, MD  dapagliflozin propanediol (FARXIGA) 10 MG TABS tablet Take 1 tablet (10 mg total) by mouth daily before breakfast. Hold medication until follow-up with cardiology service. 03/20/23   Vassie Loll, MD  ferrous sulfate 325 (65 FE) MG EC tablet Take 325 mg  by mouth every other day.    [provider]  folic acid (FOLVITE) 1 MG tablet Take 1 tablet (1 mg total) by mouth daily. 12/26/22   Johnson, Clanford L, MD  gabapentin (NEURONTIN) 100 MG capsule Take 3 capsules (300 mg total) by mouth at bedtime. Patient taking differently: Take 100 mg by mouth 3 (three) times daily. 03/20/23   Vassie Loll, MD  isosorbide mononitrate (IMDUR) 30 MG 24 hr tablet Take 1 tablet (30 mg total) by mouth daily. 06/23/23 09/21/23  Jacklynn Ganong, FNP  levothyroxine (SYNTHROID) 50 MCG tablet Take 1 tablet (50 mcg total) by mouth See admin instructions. Take 1 tablet in the morning with breakfast on SATURDAY AND SUNDAY. 02/21/23   Cleora Fleet, MD  levothyroxine (SYNTHROID) 75 MCG tablet Take 1 tablet (75 mcg total) by mouth daily Monday thru Friday. 06/08/22     linaclotide (LINZESS) 145 MCG CAPS capsule Take 145 mcg by mouth daily as needed (diarrhea).    [provider]  metolazone (ZAROXOLYN) 5 MG tablet Once a week on Wednesdays if you gain weight or swelling appreciated. Patient taking differently: Take 5 mg by mouth once a  week. Wednesday 03/22/23   Vassie Loll, MD  midodrine (PROAMATINE) 5 MG tablet Take 1 tablet (5 mg total) by mouth 3 (three) times daily with meals. 04/30/23   Mallipeddi, Vishnu P, MD  nitroGLYCERIN (NITROSTAT) 0.4 MG SL tablet Place 1 tablet (0.4 mg total) under the tongue every 5 (five) minutes x 3 doses as needed for chest pain. 06/11/23   Mallipeddi, Vishnu P, MD  ondansetron (ZOFRAN) 4 MG tablet take 1 tablet (4 mg) by mouth 2 times per day as needed for nausea 05/12/22     pantoprazole (PROTONIX) 40 MG tablet Take 1 tablet (40 mg total) by mouth 2 (two) times daily. 12/25/22   Johnson, Clanford L, MD  potassium chloride SA (KLOR-CON M) 20 MEQ tablet Take 40 mEq by mouth 2 (two) times daily. 03/23/23   [provider]  promethazine (PHENERGAN) 25 MG tablet Take 1 tablet (25 mg total) by mouth every 6 (six) hours as  needed for nausea or vomiting. 12/25/22   Laural Benes, Clanford L, MD  rOPINIRole (REQUIP) 4 MG tablet Take 1 tablet (4 mg total) by mouth 1 to 3 hours before bedtime. 06/29/22     rosuvastatin (CRESTOR) 5 MG tablet Take 1 tablet (5 mg total) by mouth at bedtime. 05/29/23   [name], Dayna N, PA-C  topiramate (TOPAMAX) 100 MG tablet TAKE 1 TABLET BY MOUTH TWICE DAILY. 05/12/22     torsemide (DEMADEX) 20 MG tablet Take 60 mg by mouth 2 (two) times daily.    [provider]  traMADol (ULTRAM) 50 MG tablet Take 1 tablet (50 mg total) by mouth daily as needed for moderate pain. 02/21/23   Johnson, Clanford L, MD  traZODone (DESYREL) 150 MG tablet Take 150 mg by mouth at bedtime.    [provider]  traZODone (DESYREL) 50 MG tablet Take 50 mg by mouth at bedtime.    [provider]                                                                                                                                    Past Surgical History Past Surgical History:  Procedure Laterality Date   ABDOMINAL HYSTERECTOMY     APPENDECTOMY     BIOPSY  12/23/2022   Procedure: BIOPSY;  Surgeon: Corbin Ade, MD;  Location: [facility];  Service: Endoscopy;;   CARDIAC CATHETERIZATION  11/22/2019   catarax Left    CHOLECYSTECTOMY     COLONOSCOPY     COLONOSCOPY WITH PROPOFOL N/A 12/25/2022   Procedure: COLONOSCOPY WITH PROPOFOL;  Surgeon: Dolores Frame, MD;  Location: [facility];  Service: Gastroenterology;  Laterality: N/A;   CORONARY ARTERY BYPASS GRAFT N/A 09/15/2019   Procedure: CORONARY ARTERY BYPASS GRAFTING (CABG) times three on pump using left internal mammary artery and right and left greater saphenous veins harvested endoscopically;  Surgeon: Alleen Borne, MD;  Location: [facility] OR;  Service: Open Heart  Surgery;  Laterality: N/A;   CORONARY STENT INTERVENTION N/A 11/23/2019   Procedure: CORONARY STENT INTERVENTION;  Surgeon: Lennette Bihari, MD;  Location: [facility] INVASIVE CV LAB;   Service: Cardiovascular;  Laterality: N/A;   ESOPHAGEAL DILATION N/A 12/23/2022   Procedure: ESOPHAGEAL DILATION;  Surgeon: Corbin Ade, MD;  Location: [facility];  Service: Endoscopy;  Laterality: N/A;   ESOPHAGOGASTRODUODENOSCOPY (EGD) WITH PROPOFOL N/A 12/23/2022   Procedure: ESOPHAGOGASTRODUODENOSCOPY (EGD) WITH PROPOFOL;  Surgeon: Corbin Ade, MD;  Location: [facility];  Service: Endoscopy;  Laterality: N/A;   KNEE ARTHROSCOPY     LEFT HEART CATH AND CORONARY ANGIOGRAPHY N/A 09/14/2019   Procedure: LEFT HEART CATH AND CORONARY ANGIOGRAPHY;  Surgeon: Corky Crafts, MD;  Location: [facility] INVASIVE CV LAB;  Service: Cardiovascular;  Laterality: N/A;   LEFT HEART CATH AND CORS/GRAFTS ANGIOGRAPHY N/A 11/22/2019   Procedure: LEFT HEART CATH AND CORS/GRAFTS ANGIOGRAPHY;  Surgeon: Lennette Bihari, MD;  Location: [facility] INVAS[facility] CV LAB;  Service: Cardiovascular;  Laterality: N/A;   LEFT HEART CATH AND CORS/GRAFTS ANGIOGRAPHY N/A 03/13/2020   Procedure: LEFT HEART CATH AND CORS/GRAFTS ANGIOGRAPHY;  Surgeon: Runell Gess, MD;  Location: [facility] INVAS[facility] CV LAB;  Service: Cardiovascular;  Laterality: N/A;   LEFT HEART CATH AND CORS/GRAFTS ANGIOGRAPHY N/A 12/31/2020   Procedure: LEFT HEART CATH AND CORS/GRAFTS ANGIOGRAPHY;  Surgeon: Lyn Records, MD;  Location: [facility] INVASIVE CV LAB;  Service: Cardiovascular;  Laterality: N/A;   OSTEOCHONDRAL DEFECT REPAIR/RECONSTRUCTION Left 11/29/2014   Procedure: LEFT ANKLE MEDIAL MALLEOLUS OSTEOTOMY,AUTO GRAFT FROM CALCANEOUS;  OS TIBIA DENORO GRAFTING TALUS;  Surgeon: Toni Arthurs, MD;  Location: [facility];  Service: Orthopedics;  Laterality: Left;   POLYPECTOMY  12/25/2022   Procedure: POLYPECTOMY;  Surgeon: Dolores Frame, MD;  [facility]: [facility];  Service: Gastroenterology;;   TEE WITHOUT CARDIOVERSION N/A 09/15/2019   Procedure: TRANSESOPHAGEAL ECHOCARDIOGRAM (TEE);  Surgeon: Alleen Borne, MD;  Location: [facility] OR;   Service: Open Heart Surgery;  Laterality: N/A;   TUBAL LIGATION     Family History Family History  Problem Relation Age of Onset   Asthma Mother    COPD Mother    Diabetes Mother    Macular degeneration Mother    Congestive Heart Failure Father    Kidney failure Sister     Social History Social History   Tobacco Use   Smoking status: Former    Current packs/day: 0.00    Average packs/day: 1 pack/day for 47.0 years (47.0 ttl pk-yrs)    Types: Cigarettes    Start date: 09/14/1972    Quit date: 09/15/2019    Years since quitting: 3.8    Passive exposure: Past   Smokeless tobacco: Never  Vaping Use   Vaping status: Every Day   Start date: 09/15/2019   Substances: Nicotine  Substance Use Topics   Alcohol use: No   Drug use: No   Allergies Abilify [aripiprazole], Ciprofloxacin, Carbidopa-levodopa, Prednisone, and Venlafaxine  Review of Systems Review of Systems  Constitutional:  Negative for chills and fever.  Eyes:  Negative for visual disturbance.  Respiratory:  Negative for chest tightness and shortness of breath.   Cardiovascular:  Negative for chest pain.  Gastrointestinal:  Negative for abdominal pain, nausea and vomiting.  Endocrine: Positive for polyphagia and polyuria.  Genitourinary:  Negative for urgency.  Neurological:  Negative for weakness and headaches.  All other systems reviewed and are negative.   Physical Exam Vital Signs  I have reviewed  the triage vital signs BP (!) 159/77   Pulse 99   Temp 99.4 F (37.4 C) (Oral)   Resp 19   Ht 5\' 3"  (1.6 m)   Wt 69.4 kg   SpO2 100%   BMI 27.10 kg/m  Physical Exam Vitals and nursing note reviewed.  Constitutional:      General: She is not in acute distress.    Appearance: Normal appearance.  HENT:     Head: Normocephalic and atraumatic.     Right Ear: External ear normal.     Left Ear: External ear normal.     Nose: Nose normal.     Mouth/Throat:     Mouth: Mucous membranes are moist.  Eyes:      General: No scleral icterus.       Right eye: No discharge.        Left eye: No discharge.  Cardiovascular:     Rate and Rhythm: Normal rate and regular rhythm.     Pulses: Normal pulses.     Heart sounds: Normal heart sounds.  Pulmonary:     Effort: Pulmonary effort is normal. No respiratory distress.     Breath sounds: Normal breath sounds. No stridor.  Abdominal:     General: Abdomen is flat. There is no distension.     Palpations: Abdomen is soft.     Tenderness: There is no abdominal tenderness.  Musculoskeletal:     Cervical back: No rigidity.     Right lower leg: No edema.     Left lower leg: No edema.  Skin:    General: Skin is warm and dry.     Capillary Refill: Capillary refill takes less than 2 seconds.  Neurological:     Mental Status: She is alert.  Psychiatric:        Mood and Affect: Mood normal.        Behavior: Behavior normal. Behavior is cooperative.     ED Results and Treatments Labs (all labs ordered are listed, but only abnormal results are displayed) Labs Reviewed  CBG MONITORING, ED - Abnormal; Notable for the following components:      Result Value   Glucose-Capillary >600 (*)    All other components within normal limits  CBC WITH DIFFERENTIAL/PLATELET  COMPREHENSIVE METABOLIC PANEL  URINALYSIS, ROUTINE W REFLEX MICROSCOPIC  BLOOD GAS, VENOUS  OSMOLALITY  BETA-HYDROXYBUTYRIC ACID                                                                                                                          Radiology No results found.  Pertinent labs & imaging results that were available during my care of the patient were reviewed by me and considered in my medical decision making (see MDM for details).  Medications Ordered in ED Medications  lactated ringers bolus 1,000 mL (has no administration in time range)  Procedures Procedures  (including critical care time)  Medical Decision Making / ED Course    Medical Decision Making:    Meghan Terry is a 62 y.o. female  with past medical history as below, significant for diastolic heart failure, CKD stage IV, CAD, DM, HLD, HTN, hypothyroid who presents to the ED with complaint of abnormal labs. The complaint involves an extensive differential diagnosis and also carries with it a high risk of complications and morbidity.  Serious etiology was considered. Ddx includes but is not limited to: Hyperglycemia, HHS, DKA, metabolic derangement, endocrine derangement, etc.  Complete initial physical exam performed, notably the patient  was no acute distress, sitting comfortably on chair.    Reviewed and confirmed nursing documentation for past medical history, family history, social history.  Vital signs reviewed.        ***                 Additional history obtained: -Additional history obtained from {[email or web address]:28072} -External records from outside source obtained and reviewed including: Chart review including previous notes, labs, imaging, consultation notes including  ***   Lab Tests: -I ordered, reviewed, and interpreted labs.   The pertinent results include:   Labs Reviewed  CBG MONITORING, ED - Abnormal; Notable for the following components:      Result Value   Glucose-Capillary >600 (*)    All other components within normal limits  CBC WITH DIFFERENTIAL/PLATELET  COMPREHENSIVE METABOLIC PANEL  URINALYSIS, ROUTINE W REFLEX MICROSCOPIC  BLOOD GAS, VENOUS  OSMOLALITY  BETA-HYDROXYBUTYRIC ACID    Notable for ***  EKG   EKG Interpretation Date/Time:    Ventricular Rate:    PR Interval:    QRS Duration:    QT Interval:    QTC Calculation:   R Axis:      Text Interpretation:           Imaging Studies ordered: I ordered imaging studies including *** I independently visualized the following imaging  with scope of interpretation limited to determining acute life threatening conditions related to emergency care; findings noted above I independently visualized and interpreted imaging. I agree with the radiologist interpretation   Medicines ordered and prescription drug management: Meds ordered this encounter  Medications   lactated ringers bolus 1,000 mL    -I have reviewed the patients home medicines and have made adjustments as needed   Consultations Obtained: I requested consultation with the ***,  and discussed lab and imaging findings as well as pertinent plan - they recommend: ***   Cardiac Monitoring: The patient was maintained on a cardiac monitor.  I personally viewed and interpreted the cardiac monitored which showed an underlying rhythm of: *** Continuous pulse oximetry interpreted by myself, ***% on ***.    Social Determinants of Health:  Diagnosis or treatment significantly limited by social determinants of health: {wssoc:28071}   Reevaluation: After the interventions noted above, I reevaluated the patient and found that they have {resolved/improved/worsened:23923::"improved"}  Co morbidities that complicate the patient evaluation  Past Medical History:  Diagnosis Date   Acute diastolic CHF (congestive heart failure) (HCC) 06/08/2021   Acute pulmonary edema (HCC)    Anemia due to stage 4 chronic kidney disease (HCC) 07/20/2022   Angina pectoris (HCC) 11/17/2019   Anxiety    Atypical chest pain 12/30/2020   Benign hypertension with CKD (chronic kidney disease) stage IV (HCC) 07/20/2022   CHF (congestive heart failure) (HCC) 06/08/2021   CKD (chronic kidney disease) stage 3b, GFR 30-59 ml/min  12/03/2019   Coronary artery disease    Coronary artery disease involving native coronary artery of native heart with angina pectoris (HCC) 10/03/2019   Depression 04/25/2017   Diabetes mellitus due to underlying condition with unspecified complications (HCC) 09/05/2019    Diabetes mellitus with stage 4 chronic kidney disease GFR 15-29 (HCC) 07/20/2022   Essential hypertension 09/05/2019   Ex-smoker 09/05/2019   Family history of coronary artery disease 09/05/2019   History of depression 04/15/2020   Hx of diabetes mellitus 04/15/2020   Hyperlipidemia with target LDL less than 70 11/24/2019   Hyperphosphatemia 07/20/2022   Hypertension    Hypokalemia 04/30/2022   Hypothyroid    Migraine 04/25/2017   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

## 2023-08-06 NOTE — ED Notes (Signed)
Pt requested to eat Informed that sh has to remain NPO due to high BGL at this time Pt stated she was going to go outside for a cigarette

## 2023-08-06 NOTE — ED Notes (Signed)
Pt c/o cramps in feet/legs- pt given mustard packs per request.

## 2023-08-06 NOTE — ED Notes (Signed)
Pt given [place] sandwich and water per DR Wallace Cullens ok - pt having cramps in legs, says she is ready to go home - Dr Wallace Cullens aware.

## 2023-08-06 NOTE — ED Triage Notes (Signed)
PCP sent to ED for BGL of 594 Denies any pian No HA, Dizziness or blurred vision   BGL "HIGH" in triage

## 2023-08-06 NOTE — ED Notes (Signed)
Pt ate sandwich prior to last CBG

## 2023-08-09 ENCOUNTER — Encounter (HOSPITAL_COMMUNITY)
Admission: RE | Disposition: A | Discharge status: Home / Self Care | Payer: Self-pay | Source: Home / Self Care | Attending: Cardiology

## 2023-08-09 ENCOUNTER — Telehealth (HOSPITAL_COMMUNITY): Payer: Self-pay

## 2023-08-09 ENCOUNTER — Ambulatory Visit (HOSPITAL_COMMUNITY)
Admission: RE | Admit: 2023-08-09 | Discharge: 2023-08-09 | Disposition: A | Discharge status: Home / Self Care | Payer: Medicare Other | Attending: Cardiology | Admitting: Cardiology

## 2023-08-09 ENCOUNTER — Encounter (HOSPITAL_COMMUNITY): Payer: Self-pay | Admitting: Cardiology

## 2023-08-09 ENCOUNTER — Other Ambulatory Visit: Payer: Self-pay

## 2023-08-09 DIAGNOSIS — I5033 Acute on chronic diastolic (congestive) heart failure: Secondary | ICD-10-CM | POA: Insufficient documentation

## 2023-08-09 DIAGNOSIS — G4700 Insomnia, unspecified: Secondary | ICD-10-CM | POA: Insufficient documentation

## 2023-08-09 DIAGNOSIS — Z833 Family history of diabetes mellitus: Secondary | ICD-10-CM | POA: Insufficient documentation

## 2023-08-09 DIAGNOSIS — Z87891 Personal history of nicotine dependence: Secondary | ICD-10-CM | POA: Insufficient documentation

## 2023-08-09 DIAGNOSIS — N189 Chronic kidney disease, unspecified: Secondary | ICD-10-CM | POA: Diagnosis not present

## 2023-08-09 DIAGNOSIS — Z8249 Family history of ischemic heart disease and other diseases of the circulatory system: Secondary | ICD-10-CM | POA: Insufficient documentation

## 2023-08-09 DIAGNOSIS — E1122 Type 2 diabetes mellitus with diabetic chronic kidney disease: Secondary | ICD-10-CM | POA: Insufficient documentation

## 2023-08-09 DIAGNOSIS — E611 Iron deficiency: Secondary | ICD-10-CM | POA: Diagnosis not present

## 2023-08-09 DIAGNOSIS — I509 Heart failure, unspecified: Secondary | ICD-10-CM

## 2023-08-09 DIAGNOSIS — E785 Hyperlipidemia, unspecified: Secondary | ICD-10-CM | POA: Diagnosis not present

## 2023-08-09 DIAGNOSIS — Z79899 Other long term (current) drug therapy: Secondary | ICD-10-CM | POA: Insufficient documentation

## 2023-08-09 DIAGNOSIS — F32A Depression, unspecified: Secondary | ICD-10-CM | POA: Insufficient documentation

## 2023-08-09 HISTORY — PX: PRESSURE SENSOR/CARDIOMEMS: CATH118258

## 2023-08-09 LAB — POCT I-STAT EG7
Acid-base deficit: 5 mmol/L — ABNORMAL HIGH (ref 0.0–2.0)
Acid-base deficit: 5 mmol/L — ABNORMAL HIGH (ref 0.0–2.0)
Bicarbonate: 21.7 mmol/L (ref 20.0–28.0)
Bicarbonate: 22.1 mmol/L (ref 20.0–28.0)
Calcium, Ion: 1.29 mmol/L (ref 1.15–1.40)
Calcium, Ion: 1.3 mmol/L (ref 1.15–1.40)
HCT: 29 % — ABNORMAL LOW (ref 36.0–46.0)
HCT: 30 % — ABNORMAL LOW (ref 36.0–46.0)
Hemoglobin: 10.2 g/dL — ABNORMAL LOW (ref 12.0–15.0)
Hemoglobin: 9.9 g/dL — ABNORMAL LOW (ref 12.0–15.0)
O2 Saturation: 62 %
O2 Saturation: 64 %
Potassium: 3.8 mmol/L (ref 3.5–5.1)
Potassium: 3.8 mmol/L (ref 3.5–5.1)
Sodium: 136 mmol/L (ref 135–145)
Sodium: 137 mmol/L (ref 135–145)
TCO2: 23 mmol/L (ref 22–32)
TCO2: 24 mmol/L (ref 22–32)
pCO2, Ven: 48 mm[Hg] (ref 44–60)
pCO2, Ven: 48.8 mm[Hg] (ref 44–60)
pH, Ven: 7.264 (ref 7.25–7.43)
pH, Ven: 7.265 (ref 7.25–7.43)
pO2, Ven: 37 mm[Hg] (ref 32–45)
pO2, Ven: 38 mm[Hg] (ref 32–45)

## 2023-08-09 LAB — GLUCOSE, CAPILLARY
Glucose-Capillary: 373 mg/dL — ABNORMAL HIGH (ref 70–99)
Glucose-Capillary: 345 mg/dL — ABNORMAL HIGH (ref 70–99)
Glucose-Capillary: 129 mg/dL — ABNORMAL HIGH (ref 70–99)
Glucose-Capillary: 149 mg/dL — ABNORMAL HIGH (ref 70–99)

## 2023-08-09 SURGERY — PRESSURE SENSOR/CARDIOMEMS
Anesthesia: LOCAL

## 2023-08-09 MED ORDER — ONDANSETRON HCL 4 MG/2ML IJ SOLN: 4.0000 mg | Freq: Four times a day (QID) | INTRAMUSCULAR | Status: DC | PRN

## 2023-08-09 MED ORDER — FENTANYL CITRATE (PF) 100 MCG/2ML IJ SOLN
INTRAMUSCULAR | Status: AC
  Filled 2023-08-09: qty 2

## 2023-08-09 MED ORDER — FENTANYL CITRATE (PF) 100 MCG/2ML IJ SOLN
INTRAMUSCULAR | Status: DC | PRN
  Administered 2023-08-09 (×2): 25 ug via INTRAVENOUS

## 2023-08-09 MED ORDER — MIDAZOLAM HCL 2 MG/2ML IJ SOLN
INTRAMUSCULAR | Status: AC
  Filled 2023-08-09: qty 2

## 2023-08-09 MED ORDER — LIDOCAINE HCL (PF) 1 % IJ SOLN
INTRAMUSCULAR | Status: AC
  Filled 2023-08-09: qty 30

## 2023-08-09 MED ORDER — DAPAGLIFLOZIN PROPANEDIOL 10 MG PO TABS: 10.0000 mg | ORAL_TABLET | Freq: Every day | ORAL | 5 refills | Status: AC

## 2023-08-09 MED ORDER — ACETAMINOPHEN 325 MG PO TABS
650.0000 mg | ORAL_TABLET | ORAL | Status: DC | PRN
Start: 2023-08-09 — End: 2023-08-09

## 2023-08-09 MED ORDER — SODIUM CHLORIDE 0.9% FLUSH: 3.0000 mL | Freq: Two times a day (BID) | INTRAVENOUS | Status: DC

## 2023-08-09 MED ORDER — INSULIN ASPART 100 UNIT/ML IJ SOLN
10.0000 [IU] | Freq: Once | INTRAMUSCULAR | Status: AC
  Administered 2023-08-09: 10 [IU] via SUBCUTANEOUS
  Filled 2023-08-09: qty 1

## 2023-08-09 MED ORDER — SODIUM CHLORIDE 0.9% FLUSH: 10.0000 mL | Freq: Two times a day (BID) | INTRAVENOUS | Status: DC

## 2023-08-09 MED ORDER — SODIUM CHLORIDE 0.9% FLUSH: 3.0000 mL | INTRAVENOUS | Status: DC | PRN

## 2023-08-09 MED ORDER — MIDAZOLAM HCL 2 MG/2ML IJ SOLN
INTRAMUSCULAR | Status: DC | PRN
  Administered 2023-08-09: 2 mg via INTRAVENOUS

## 2023-08-09 MED ORDER — LIDOCAINE HCL (PF) 1 % IJ SOLN
INTRAMUSCULAR | Status: DC | PRN
  Administered 2023-08-09: 25 mL

## 2023-08-09 MED ORDER — HEPARIN (PORCINE) IN NACL 1000-0.9 UT/500ML-% IV SOLN
INTRAVENOUS | Status: DC | PRN
  Administered 2023-08-09 (×2): 500 mL

## 2023-08-09 MED ORDER — IOHEXOL 350 MG/ML SOLN
INTRAVENOUS | Status: DC | PRN
  Administered 2023-08-09: 10 mL

## 2023-08-09 MED ORDER — SODIUM CHLORIDE 0.9 % IV SOLN: 250.0000 mL | INTRAVENOUS | Status: DC | PRN

## 2023-08-09 SURGICAL SUPPLY — 13 items
CARDIOMEMS PA SENSOR W/DELIVER (Prosthesis & Implant Heart) ×1 IMPLANT
CATH SWAN GANZ 7F STRAIGHT (CATHETERS) IMPLANT
GUIDEWIRE .025 260CM (WIRE) IMPLANT
KIT MICROPUNCTURE NIT STIFF (SHEATH) IMPLANT
PACK CARDIAC CATHETERIZATION (CUSTOM PROCEDURE TRAY) IMPLANT
SENSOR CARDIOMEMS PA W/DELIVER (Prosthesis & Implant Heart) IMPLANT
SHEATH FAST CATH 12F 12CM (SHEATH) IMPLANT
SHEATH PINNACLE 7F 10CM (SHEATH) IMPLANT
SHEATH PROBE COVER 6X72 (BAG) IMPLANT
TRANSDUCER W/STOPCOCK (MISCELLANEOUS) IMPLANT
TUBING ART PRESS 72 MALE/FEM (TUBING) IMPLANT
WIRE EMERALD 3MM-J .025X260CM (WIRE) IMPLANT
WIRE NITREX .018X300 STIFF (WIRE) IMPLANT

## 2023-08-09 NOTE — Progress Notes (Signed)
Site area: 18F  venous sheath Site Prior to Removal:  Level 0 Pressure Applied For: 25 minutes Manual:   yes Patient Status During Pull:  stable Post Pull Site:  Level 0 Post Pull Instructions Given:  yes Post Pull Pulses Present: rt dp palpable Dressing Applied:  gauze and tegaderm Bedrest begins @ 0935 Comments:

## 2023-08-09 NOTE — Progress Notes (Signed)
Notified Lauren in cath lab holding of 10 units novolog given and that CBG rechceck should be 0655.

## 2023-08-09 NOTE — Telephone Encounter (Signed)
Per Dr. Gasper Lloyd- patient needs refill of farxiga- this as been sent in.

## 2023-08-09 NOTE — Progress Notes (Signed)
Patient and son was given discharge instructions. Both verbalized understanding. 

## 2023-08-09 NOTE — Interval H&P Note (Signed)
History and Physical Interval Note:  08/09/2023 7:30 AM  Meghan Terry  has presented today for surgery, with the diagnosis of chf.  The various methods of treatment have been discussed with the patient and family. After consideration of risks, benefits and other options for treatment, the patient has consented to  Procedure(s): PRESSURE SENSOR/CARDIOMEMS (N/A) as a surgical intervention.  The patient's history has been reviewed, patient examined, no change in status, stable for surgery.  I have reviewed the patient's chart and labs.  Questions were answered to the patient's satisfaction.     Fairy Ashlock

## 2023-08-09 NOTE — Progress Notes (Signed)
Notified Paul, cath lab RN, of 10 unit insulin administration in procedural short stay and BG recheck of 345.

## 2023-08-10 ENCOUNTER — Telehealth (HOSPITAL_COMMUNITY): Payer: Self-pay | Admitting: Adult Health

## 2023-08-10 NOTE — Telephone Encounter (Signed)
   Called and left message that [facility] readings are will be monitored weekly and we were able to review the reading she sent from today.   Kirti Carl NP-C  3:55 PM

## 2023-08-11 DIAGNOSIS — N184 Chronic kidney disease, stage 4 (severe): Secondary | ICD-10-CM | POA: Diagnosis not present

## 2023-08-11 DIAGNOSIS — E1122 Type 2 diabetes mellitus with diabetic chronic kidney disease: Secondary | ICD-10-CM | POA: Diagnosis not present

## 2023-08-11 DIAGNOSIS — E1165 Type 2 diabetes mellitus with hyperglycemia: Secondary | ICD-10-CM | POA: Diagnosis not present

## 2023-08-11 DIAGNOSIS — Z794 Long term (current) use of insulin: Secondary | ICD-10-CM | POA: Diagnosis not present

## 2023-08-11 DIAGNOSIS — N179 Acute kidney failure, unspecified: Secondary | ICD-10-CM | POA: Diagnosis not present

## 2023-08-11 DIAGNOSIS — D638 Anemia in other chronic diseases classified elsewhere: Secondary | ICD-10-CM | POA: Diagnosis not present

## 2023-08-11 DIAGNOSIS — I130 Hypertensive heart and chronic kidney disease with heart failure and stage 1 through stage 4 chronic kidney disease, or unspecified chronic kidney disease: Secondary | ICD-10-CM | POA: Diagnosis not present

## 2023-08-11 DIAGNOSIS — I5033 Acute on chronic diastolic (congestive) heart failure: Secondary | ICD-10-CM | POA: Diagnosis not present

## 2023-08-23 ENCOUNTER — Encounter: Payer: Medicare Other | Admitting: Oncology

## 2023-08-25 ENCOUNTER — Other Ambulatory Visit: Payer: Self-pay

## 2023-09-07 ENCOUNTER — Ambulatory Visit (HOSPITAL_COMMUNITY)
Admission: RE | Admit: 2023-09-07 | Discharge: 2023-09-07 | Disposition: A | Discharge status: Home / Self Care | Payer: Medicare Other | Source: Ambulatory Visit | Attending: Cardiology | Admitting: Cardiology

## 2023-09-07 ENCOUNTER — Encounter (HOSPITAL_COMMUNITY): Payer: Self-pay

## 2023-09-07 VITALS — BP 140/80 | HR 103 | Wt 170.6 lb

## 2023-09-07 DIAGNOSIS — Z79899 Other long term (current) drug therapy: Secondary | ICD-10-CM | POA: Insufficient documentation

## 2023-09-07 DIAGNOSIS — E785 Hyperlipidemia, unspecified: Secondary | ICD-10-CM | POA: Insufficient documentation

## 2023-09-07 DIAGNOSIS — N184 Chronic kidney disease, stage 4 (severe): Secondary | ICD-10-CM | POA: Insufficient documentation

## 2023-09-07 DIAGNOSIS — Z794 Long term (current) use of insulin: Secondary | ICD-10-CM | POA: Diagnosis not present

## 2023-09-07 DIAGNOSIS — E1122 Type 2 diabetes mellitus with diabetic chronic kidney disease: Secondary | ICD-10-CM | POA: Insufficient documentation

## 2023-09-07 DIAGNOSIS — I130 Hypertensive heart and chronic kidney disease with heart failure and stage 1 through stage 4 chronic kidney disease, or unspecified chronic kidney disease: Secondary | ICD-10-CM | POA: Diagnosis not present

## 2023-09-07 DIAGNOSIS — I25118 Atherosclerotic heart disease of native coronary artery with other forms of angina pectoris: Secondary | ICD-10-CM | POA: Insufficient documentation

## 2023-09-07 DIAGNOSIS — D509 Iron deficiency anemia, unspecified: Secondary | ICD-10-CM

## 2023-09-07 DIAGNOSIS — D631 Anemia in chronic kidney disease: Secondary | ICD-10-CM | POA: Diagnosis not present

## 2023-09-07 DIAGNOSIS — Z87891 Personal history of nicotine dependence: Secondary | ICD-10-CM | POA: Diagnosis not present

## 2023-09-07 DIAGNOSIS — Z7984 Long term (current) use of oral hypoglycemic drugs: Secondary | ICD-10-CM | POA: Insufficient documentation

## 2023-09-07 DIAGNOSIS — I5032 Chronic diastolic (congestive) heart failure: Secondary | ICD-10-CM | POA: Insufficient documentation

## 2023-09-07 DIAGNOSIS — Z951 Presence of aortocoronary bypass graft: Secondary | ICD-10-CM | POA: Insufficient documentation

## 2023-09-07 LAB — CBC
HCT: 35.6 % — ABNORMAL LOW (ref 36.0–46.0)
Hemoglobin: 11.6 g/dL — ABNORMAL LOW (ref 12.0–15.0)
MCH: 30.3 pg (ref 26.0–34.0)
MCHC: 32.6 g/dL (ref 30.0–36.0)
MCV: 93 fL (ref 80.0–100.0)
Platelets: 237 10*3/uL (ref 150–400)
RBC: 3.83 MIL/uL — ABNORMAL LOW (ref 3.87–5.11)
RDW: 12.6 % (ref 11.5–15.5)
WBC: 9.8 10*3/uL (ref 4.0–10.5)
nRBC: 0 % (ref 0.0–0.2)

## 2023-09-07 LAB — BASIC METABOLIC PANEL
Anion gap: 9 (ref 5–15)
BUN: 36 mg/dL — ABNORMAL HIGH (ref 8–23)
CO2: 24 mmol/L (ref 22–32)
Calcium: 8.9 mg/dL (ref 8.9–10.3)
Chloride: 101 mmol/L (ref 98–111)
Creatinine, Ser: 2.38 mg/dL — ABNORMAL HIGH (ref 0.44–1.00)
GFR, Estimated: 22 mL/min — ABNORMAL LOW (ref >60)
Glucose, Bld: 354 mg/dL — ABNORMAL HIGH (ref 70–99)
Potassium: 3.8 mmol/L (ref 3.5–5.1)
Sodium: 134 mmol/L — ABNORMAL LOW (ref 135–145)

## 2023-09-07 LAB — BRAIN NATRIURETIC PEPTIDE: B Natriuretic Peptide: 67.5 pg/mL (ref 0.0–100.0)

## 2023-09-07 MED ORDER — MIDODRINE HCL 2.5 MG PO TABS: 2.5000 mg | ORAL_TABLET | Freq: Three times a day (TID) | ORAL | 11 refills | Status: DC

## 2023-09-07 NOTE — Progress Notes (Signed)
ADVANCED HEART FAILURE CLINIC NOTE  Primary Care: Benita Stabile, MD Primary Cardiologist: Luane School, MD HF Cardiologist: Dr. Gasper Lloyd  HPI: Meghan Terry is a 62 y.o. female with CAD status post CABG in 2020 with LIMA to LAD, SVG to OM and SVG to PDA, history of PCI x 3 to the RCA, hypertension, hyperlipidemia, type 2 diabetes, stage IV CKD, chronic anemia and heart failure with preserved ejection fraction presenting today for follow up.  She was recently admitted to the hospital after presenting to outpatient clinic visit with decompensated HFpEF.  She was diuresed 4.5 L with IV Lasix and discharged home. During our last appointment, we increased diuretics, however, she once more had another episode of decompensated HFpEF leading to admission in 6/24; now over 6 hospitalizations for HFpEF in the past 1 year.   Interval hx:  - Follow up 7/24, volume stable. Arranged for Zoll HFMS (148 lbs, REDs 32%) to attempt to keep keep close eye on labile volume status.  - Follow up 8/24, significant improvement in energy level, dyspnea and Meghan Terry, SCr improved 3.5>>2.3. Plan to move forward with CardioMEMs implant.  Admitted 05/27/23 with CP and decompensation HFpEF. Had 20 lb weight gain in 2.5 weeks. It was felt CP 2/2 hypervolemia. She was diuresed. HsTroponin 15>>17, LHC deferred. Continued with dull chest ache and started on colchicine for presumed acute pericarditis (Ranexa held and Crestor dose reduced). No ESR or CRP to review. She was discharged home, weight 161 lbs.   Post hospital follow up 9/24, worsening NYHA III symptoms and volume overloaded. Give 80 mg IV lasix + metolazone 2.5 mg/40 KCL in infusion clinic.   Today she returns for HF follow up. Overall feeling fine. She has SOB walking short distance on flat ground and upstairs where her bedroom is located. Has chronic postural dizziness. Has CP "all the time" and taking PRN nitroglycerin. Denies abnormal bleeding or PND/Orthopnea. No  edema. Appetite ok. No fever or chills. Weight at home 162-163 pounds. BP at home 140/80s. Taking all medications, off colchicine and ranexa.   Past Medical History:  Diagnosis Date   Acute diastolic CHF (congestive heart failure) (HCC) 06/08/2021   Acute pericarditis 05/29/2023   Acute pulmonary edema (HCC)    Acute renal failure superimposed on stage 4 chronic kidney disease (HCC) 04/30/2022   Anemia due to stage 4 chronic kidney disease (HCC) 07/20/2022   Angina pectoris (HCC) 11/17/2019   Anxiety    Atypical chest pain 12/30/2020   Benign hypertension with CKD (chronic kidney disease) stage IV (HCC) 07/20/2022   CHF (congestive heart failure) (HCC) 06/08/2021   Chronic constipation 11/12/2022   CKD (chronic kidney disease) stage 3b, GFR 30-59 ml/min 12/03/2019   Cognitive impairment 02/02/2023   Coronary artery disease    Coronary artery disease involving native coronary artery of native heart with angina pectoris (HCC) 10/03/2019   Depression 04/25/2017   Diabetes mellitus due to underlying condition with unspecified complications (HCC) 09/05/2019   Diabetes mellitus with stage 4 chronic kidney disease GFR 15-29 (HCC) 07/20/2022   Diabetic nephropathy associated with type 2 diabetes mellitus (HCC) 08/10/2022   Essential hypertension 09/05/2019   Ex-smoker 09/05/2019   Family history of coronary artery disease 09/05/2019   History of depression 04/15/2020   Hx of diabetes mellitus 04/15/2020   Hyperlipidemia with target LDL less than 70 11/24/2019   Hyperphosphatemia 07/20/2022   Hypertension    Hypokalemia 04/30/2022   Hypothyroid    Insomnia 07/14/2023   Migraine 04/25/2017  Moderate recurrent major depression (HCC) 11/12/2022   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   Prerenal azotemia 07/20/2022   Presence of drug coated stent in right coronary artery 11/24/2019   Pyelonephritis 05/20/2016   Restless legs syndrome 04/15/2020   Formatting of this note might be different from  the original. 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   Syncope and collapse 04/29/2022   Thyroid disease    UTI (urinary tract infection) 04/30/2022   Current Outpatient Medications  Medication Sig Dispense Refill   acetaminophen (TYLENOL) 500 MG tablet Take 2 tablets by mouth 3 times daily as needed for pain 100 tablet 0   aspirin EC 81 MG tablet Take 1 tablet (81 mg total) by mouth daily. 90 tablet 3   buPROPion (WELLBUTRIN XL) 300 MG 24 hr tablet Take 1 tablet (300 mg total) by mouth daily. 90 tablet 2   cholecalciferol 25 MCG (1000 UT) tablet Take 1 tablet (1,000 Units total) by mouth in the morning and at bedtime.     clopidogrel (PLAVIX) 75 MG tablet TAKE 1 TABLET BY MOUTH DAILY 90 tablet 2   cyanocobalamin (VITAMIN B12) 1000 MCG tablet Take 1 tablet (1,000 mcg total) by mouth daily. 30 tablet 1   cyclobenzaprine (FLEXERIL) 10 MG tablet Take 1 tablet (10 mg total) by mouth 2 (two) times daily as needed for muscle spasms. 20 tablet 0   dapagliflozin propanediol (FARXIGA) 10 MG TABS tablet Take 1 tablet (10 mg total) by mouth daily before breakfast. Hold medication until follow-up with cardiology service. 30 tablet 5   ferrous sulfate 325 (65 FE) MG EC tablet Take 325 mg by mouth every other day.     folic acid (FOLVITE) 1 MG tablet Take 1 tablet (1 mg total) by mouth daily. 30 tablet 1   gabapentin (NEURONTIN) 100 MG capsule Take 100 mg by mouth 3 (three) times daily.     insulin glargine (LANTUS) 100 UNIT/ML injection Inject 34 Units into the skin.     isosorbide mononitrate (IMDUR) 30 MG 24 hr tablet Take 1 tablet (30 mg total) by mouth daily. 90 tablet 3   levothyroxine (SYNTHROID) 50 MCG tablet Take 1 tablet (50 mcg total) by mouth See admin instructions. Take 1 tablet in the morning with breakfast on SATURDAY AND SUNDAY.     levothyroxine (SYNTHROID) 75 MCG tablet Take 1 tablet (75 mcg total) by mouth daily Monday thru Friday. 60  tablet 3   linaclotide (LINZESS) 145 MCG CAPS capsule Take 145 mcg by mouth daily as needed (diarrhea).     metolazone (ZAROXOLYN) 5 MG tablet Once a week on Wednesdays if you gain weight or swelling appreciated. 12 tablet 1   nitroGLYCERIN (NITROSTAT) 0.4 MG SL tablet Place 1 tablet (0.4 mg total) under the tongue every 5 (five) minutes x 3 doses as needed for chest pain. 25 tablet 7   ondansetron (ZOFRAN) 4 MG tablet take 1 tablet (4 mg) by mouth 2 times per day as needed for nausea 180 tablet 1   pantoprazole (PROTONIX) 40 MG tablet Take 1 tablet (40 mg total) by mouth 2 (two) times daily. 60 tablet 1   potassium chloride SA (KLOR-CON M) 20 MEQ tablet Take 40 mEq by mouth 2 (two) times daily.     promethazine (PHENERGAN) 25 MG tablet Take 1 tablet (25 mg total) by mouth every 6 (six) hours as needed for nausea or vomiting.  rOPINIRole (REQUIP) 4 MG tablet Take 1 tablet (4 mg total) by mouth 1 to 3 hours before bedtime. 90 tablet 0   rosuvastatin (CRESTOR) 5 MG tablet Take 1 tablet (5 mg total) by mouth at bedtime. 30 tablet 1   topiramate (TOPAMAX) 100 MG tablet TAKE 1 TABLET BY MOUTH TWICE DAILY. 180 tablet 3   torsemide (DEMADEX) 20 MG tablet Take 60 mg by mouth 2 (two) times daily.     traMADol (ULTRAM) 50 MG tablet Take 1 tablet (50 mg total) by mouth daily as needed for moderate pain.     traZODone (DESYREL) 150 MG tablet Take 150 mg by mouth at bedtime.     traZODone (DESYREL) 50 MG tablet Take 50 mg by mouth at bedtime.     midodrine (PROAMATINE) 2.5 MG tablet Take 1 tablet (2.5 mg total) by mouth 3 (three) times daily with meals. 60 tablet 11   No current facility-administered medications for this encounter.   Allergies  Allergen Reactions   Abilify [Aripiprazole] Other (See Comments)    "Makes me sick"    Ciprofloxacin Nausea And Vomiting   Carbidopa-Levodopa Other (See Comments)    Developed tics while taking    Prednisone Other (See Comments)    Turns red all over     Venlafaxine Other (See Comments)    Developed tics while taking - reaction to Effexor    Social History   Socioeconomic History   Marital status: Married    Spouse name: Aalaysia Barthelemy   Number of children: 2   Years of education: Not on file   Highest education level: Associate degree: occupational, Scientist, product/process development, or vocational program  Occupational History   Occupation: disabilty    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  Tobacco Use   Smoking status: Former    Current packs/day: 0.00    Average packs/day: 1 pack/day for 47.0 years (47.0 ttl pk-yrs)    Types: Cigarettes    Start date: 09/14/1972    Quit date: 09/15/2019    Years since quitting: 3.9    Passive exposure: Past   Smokeless tobacco: Never  Vaping Use   Vaping status: Every Day   Start date: 09/15/2019   Substances: Nicotine  Substance and Sexual Activity   Alcohol use: No   Drug use: No   Sexual activity: Not Currently  Other Topics Concern   Not on file  Social History Narrative   Right handed   Caffeine-1-2 cups daily   Social Determinants of Health   Financial Resource Strain: High Risk (06/19/2021)   Overall Financial Resource Strain (CARDIA)    Difficulty of Paying Living Expenses: Very hard  Food Insecurity: No Food Insecurity (05/27/2023)   Hunger Vital Sign    Worried About Running Out of Food in the Last Year: Never true    Ran Out of Food in the Last Year: Never true  Transportation Needs: No Transportation Needs (05/27/2023)   PRAPARE - Administrator, Civil Service (Medical): No    Lack of Transportation (Non-Medical): No  Physical Activity: Not on file  Stress: Not on file  Social Connections: Not on file  Intimate Partner Violence: Not At Risk (05/27/2023)   Humiliation, Afraid, Rape, and Kick questionnaire    Fear of Current or Ex-Partner: No    Emotionally Abused: No    Physically Abused: No    Sexually Abused: No   Family History  Problem Relation Age of Onset   Asthma Mother  COPD Mother    Diabetes Mother    Macular degeneration Mother    Congestive Heart Failure Father    Kidney failure Sister    BP (!) 140/80   Pulse (!) 103   Wt 77.4 kg (170 lb 9.6 oz)   SpO2 99%   BMI 30.22 kg/m   Wt Readings from Last 3 Encounters:  09/07/23 77.4 kg (170 lb 9.6 oz)  08/09/23 77.1 kg (170 lb)  08/06/23 69.4 kg (153 lb)   PHYSICAL EXAM: General:  Well appearing. No resp difficulty. Walked into clinic. HEENT: normal Neck: supple. no JVD. Carotids 2+ bilat; no bruits. No lymphadenopathy or thryomegaly appreciated. Cor: PMI nondisplaced. Regular rate & rhythm. No rubs, gallops or murmurs. Lungs: clear Abdomen: soft, nontender, nondistended. No hepatosplenomegaly. No bruits or masses. Good bowel sounds. Extremities: no cyanosis, clubbing, rash, edema Neuro: alert & orientedx3, cranial nerves grossly intact. moves all 4 extremities w/o difficulty. Affect pleasant  DATA REVIEW  ECG: 02/18/23: NSR  06/15/23: ST 102 bpm QRS 474 msec 09/07/23: ST 103 bpm QRS 88 ms  ECHO: 12/22/22: LVEF 60-65%, grade II DD; normal RV function. 05/28/23: LVEF 60-65%, grade II DD, normal RV, PASP 26  CATH: 01/18/23: Overall, no significant change when compared to angiography performed in June 2021. LIMA to LAD is patent. Bypass graft to circumflex is patent.  Distribution of native vessel after the insertion is very small.  The native vessel diameter is minuscule. Bypass graft to PDA is patent.  PDA is totally occluded at its ostium and is jailed by stent. Left main is widely patent. LAD is severely and diffusely diseased with 70 to 80% stenosis from proximal to mid.  Competitive flow is noted in the mid LAD due to LIMA graft insertion. Circumflex is totally occluded in the mid vessel.  Very faint collaterals are noted to small "threadlike vessels". Right coronary is widely patent and has a heavy burden of stents that extends from the mid vessel into the continuation beyond the  origin of the PDA.  The PDA is jailed.  No significant obstruction is noted in the right coronary other than the PDA which is supplied by a graft. Hyperdynamic left ventricle.  EF greater than 70%.  LVEDP is normal. 08/09/23:  Successful Cardiomems placement.  RA 1, PCWP 3, CO/CI (thermo) 4/2.2, PVR 2.5, PAPi 14.  Selective pulmonary angiography of L-PA with no focal stenosis   CARDIOMEMS (PAD goal 5): 09/07/23: PAD 8   ASSESSMENT & PLAN: Heart Failure with preserved EF - Recurrent hospital admissions for HFpEF - Re-admitted in June 2024 with decompensated HFpEF despite compliance with diuretics. She has now had frequent hospital admissions.  - During prior visit, we started the [facility] and she has had improvement in sCr from 3.5 to 2.3 in addition to significant improvement in functional status.  - Prior to remote monitoring she had 5 hospital admissions with rapid rise in SCr. Cardiomems placed 11/24. RA 1 PCWP 3 CO/CI 4/2.2 - Unfortunately she had another admission for decompensated HFpEF 8/24. Echo 8/24 showed EF 60-65%, RV normal - NYHA IIb-III. Cardiomems PAD 8. Euvolemic on exam.  - Continue Torsemide 60 mg bid + Metolazone 5 mg weekly  - Continue Farxiga 10 mg daily - Reduce Midodrine to 2.5 mg tid for 2 weeks, then stop. Taking BP daily at home. - Hold off on spiro with labile renal function, most recent SCr 2.78 - BMET today.  2. CAD s/p CABG - 3V CABG w/ LIMA to LAD,  SVG-OM, SVG-PDA - She stopped her Toprol - Off Ranexa in setting of colchicine use. - Continues with chronic stable angina, appear to be her previous pattern.  - Continue Imdur to 30 mg daily - Now off colchicine, will defer restarting Ranexa to Dr. Jenene Slicker   3. Hyperlipidemia - Continue rosuvastatin 5 mg - Per general cardiology  4. CKD IV - Follows with [facility], Dr. Wolfgang Phoenix - Baseline SCr 2.3-2.7 - BMET today  5. IDA - Tsat 18%, ferrain 61 - Receiving intermittent Fe infusions. Followed by  hem/onc. - CBC today  Follow up in 6 months with Dr. Sabharwal  [place] Miki Blank, NP 09/07/23

## 2023-09-07 NOTE — Patient Instructions (Signed)
DECREASE Midodrine to 2.5 mg Three times a day  Labs done today, your results will be available in MyChart, we will contact you for abnormal readings.  Your physician recommends that you schedule a follow-up appointment in: 6 months ( June 2025) ** PLEASE CALL THE OFFICE IN [place]IL TO ARRANGE YOUR FOLLOW UP APPOINTMENT. **  If you have any questions or concerns before your next appointment please send [place] a message through [place] or call our office at [phone].    TO LEAVE A MESSAGE FOR THE NURSE SELECT OPTION 2, PLEASE LEAVE A MESSAGE INCLUDING: YOUR NAME DATE OF BIRTH CALL BACK NUMBER REASON FOR CALL**this is important as we prioritize the call backs  YOU WILL RECEIVE A CALL BACK THE SAME DAY AS LONG AS YOU CALL BEFORE 4:00 PM  At the [facility], you and your health needs are our priority. As part of our continuing mission to provide you with exceptional heart care, we have created designated Provider Care Teams. These Care Teams include your primary Cardiologist (physician) and Advanced Practice Providers (APPs- Physician Assistants and Nurse Practitioners) who all work together to provide you with the care you need, when you need it.   You may see any of the following providers on your designated Care Team at your next follow up: Dr Arvilla Meres Dr Marca Ancona Dr. [name] Dr. Clearnce Hasten Amy Filbert Schilder, NP Robbie Lis, [place] [facility] [place], [place] Brynda Peon, NP [place] Lee, NP Karle Plumber, PharmD   Please be sure to bring in all your medications bottles to every appointment.    Thank you for choosing [facility]

## 2023-09-14 DIAGNOSIS — R0981 Nasal congestion: Secondary | ICD-10-CM | POA: Diagnosis not present

## 2023-09-14 DIAGNOSIS — E559 Vitamin D deficiency, unspecified: Secondary | ICD-10-CM | POA: Diagnosis not present

## 2023-09-14 DIAGNOSIS — R059 Cough, unspecified: Secondary | ICD-10-CM | POA: Diagnosis not present

## 2023-09-14 DIAGNOSIS — K219 Gastro-esophageal reflux disease without esophagitis: Secondary | ICD-10-CM | POA: Diagnosis not present

## 2023-09-14 DIAGNOSIS — E039 Hypothyroidism, unspecified: Secondary | ICD-10-CM | POA: Diagnosis not present

## 2023-09-14 DIAGNOSIS — R051 Acute cough: Secondary | ICD-10-CM | POA: Diagnosis not present

## 2023-09-14 DIAGNOSIS — J22 Unspecified acute lower respiratory infection: Secondary | ICD-10-CM | POA: Diagnosis not present

## 2023-09-14 DIAGNOSIS — F5101 Primary insomnia: Secondary | ICD-10-CM | POA: Diagnosis not present

## 2023-09-14 DIAGNOSIS — J020 Streptococcal pharyngitis: Secondary | ICD-10-CM | POA: Diagnosis not present

## 2023-09-14 DIAGNOSIS — E785 Hyperlipidemia, unspecified: Secondary | ICD-10-CM | POA: Diagnosis not present

## 2023-09-14 DIAGNOSIS — N184 Chronic kidney disease, stage 4 (severe): Secondary | ICD-10-CM | POA: Diagnosis not present

## 2023-09-14 DIAGNOSIS — F17200 Nicotine dependence, unspecified, uncomplicated: Secondary | ICD-10-CM | POA: Diagnosis not present

## 2023-09-14 DIAGNOSIS — R0602 Shortness of breath: Secondary | ICD-10-CM | POA: Diagnosis not present

## 2023-09-29 ENCOUNTER — Telehealth: Payer: Self-pay | Admitting: Student

## 2023-09-29 NOTE — Telephone Encounter (Addendum)
   The patient called the after-hours line reporting a 10 pound weight gain within the past 6 days.  Reports worsening shortness of breath along with swelling along her legs. She denies any acute dietary changes over the holidays and has been compliant with her medications. She did take Metolazone  today as she typically takes this on Wednesdays. I recommended that she take an extra 40 mg of Torsemide  this afternoon along with an extra 20 mEq of K-dur. I did not adjust her baseline dose given her variable renal function (creatinine at 2.38 in 08/2023). I encouraged her to follow-up in regards to symptoms and weight changes tomorrow. Will also send a note to the [facility].   Signed, Laymon CHRISTELLA Qua, PA-C 09/29/2023, 6:30 PM

## 2023-09-30 NOTE — Telephone Encounter (Signed)
 With weight gain, please take an additional Metolazone 5 mg today. Please tell her if weigh does not return to baseline, she will need to be seen sooner in clinic.

## 2023-09-30 NOTE — Telephone Encounter (Signed)
 Left message to call back

## 2023-10-01 ENCOUNTER — Encounter: Payer: Self-pay | Admitting: Oncology

## 2023-10-01 ENCOUNTER — Ambulatory Visit (HOSPITAL_COMMUNITY)
Admission: RE | Admit: 2023-10-01 | Discharge: 2023-10-01 | Disposition: A | Discharge status: Home / Self Care | Payer: Medicare HMO | Source: Ambulatory Visit | Attending: Cardiology | Admitting: Cardiology

## 2023-10-01 VITALS — BP 136/70 | HR 106 | Wt 178.4 lb

## 2023-10-01 DIAGNOSIS — E785 Hyperlipidemia, unspecified: Secondary | ICD-10-CM | POA: Insufficient documentation

## 2023-10-01 DIAGNOSIS — Z79899 Other long term (current) drug therapy: Secondary | ICD-10-CM | POA: Diagnosis not present

## 2023-10-01 DIAGNOSIS — N184 Chronic kidney disease, stage 4 (severe): Secondary | ICD-10-CM | POA: Insufficient documentation

## 2023-10-01 DIAGNOSIS — I130 Hypertensive heart and chronic kidney disease with heart failure and stage 1 through stage 4 chronic kidney disease, or unspecified chronic kidney disease: Secondary | ICD-10-CM | POA: Diagnosis not present

## 2023-10-01 DIAGNOSIS — R600 Localized edema: Secondary | ICD-10-CM | POA: Insufficient documentation

## 2023-10-01 DIAGNOSIS — E8770 Fluid overload, unspecified: Secondary | ICD-10-CM | POA: Insufficient documentation

## 2023-10-01 DIAGNOSIS — I5032 Chronic diastolic (congestive) heart failure: Secondary | ICD-10-CM | POA: Diagnosis not present

## 2023-10-01 DIAGNOSIS — Z5986 Financial insecurity: Secondary | ICD-10-CM | POA: Insufficient documentation

## 2023-10-01 DIAGNOSIS — Z794 Long term (current) use of insulin: Secondary | ICD-10-CM | POA: Diagnosis not present

## 2023-10-01 DIAGNOSIS — E1122 Type 2 diabetes mellitus with diabetic chronic kidney disease: Secondary | ICD-10-CM | POA: Diagnosis not present

## 2023-10-01 DIAGNOSIS — D509 Iron deficiency anemia, unspecified: Secondary | ICD-10-CM | POA: Insufficient documentation

## 2023-10-01 DIAGNOSIS — I25118 Atherosclerotic heart disease of native coronary artery with other forms of angina pectoris: Secondary | ICD-10-CM | POA: Insufficient documentation

## 2023-10-01 DIAGNOSIS — Z951 Presence of aortocoronary bypass graft: Secondary | ICD-10-CM | POA: Diagnosis not present

## 2023-10-01 DIAGNOSIS — I5033 Acute on chronic diastolic (congestive) heart failure: Secondary | ICD-10-CM | POA: Diagnosis present

## 2023-10-01 LAB — BASIC METABOLIC PANEL
Anion gap: 7 (ref 5–15)
BUN: 24 mg/dL — ABNORMAL HIGH (ref 8–23)
CO2: 22 mmol/L (ref 22–32)
Calcium: 8.8 mg/dL — ABNORMAL LOW (ref 8.9–10.3)
Chloride: 108 mmol/L (ref 98–111)
Creatinine, Ser: 2.04 mg/dL — ABNORMAL HIGH (ref 0.44–1.00)
GFR, Estimated: 27 mL/min — ABNORMAL LOW (ref >60)
Glucose, Bld: 247 mg/dL — ABNORMAL HIGH (ref 70–99)
Potassium: 4.5 mmol/L (ref 3.5–5.1)
Sodium: 137 mmol/L (ref 135–145)

## 2023-10-01 LAB — CBC
HCT: 33.9 % — ABNORMAL LOW (ref 36.0–46.0)
Hemoglobin: 10.9 g/dL — ABNORMAL LOW (ref 12.0–15.0)
MCH: 30.1 pg (ref 26.0–34.0)
MCHC: 32.2 g/dL (ref 30.0–36.0)
MCV: 93.6 fL (ref 80.0–100.0)
Platelets: 282 10*3/uL (ref 150–400)
RBC: 3.62 MIL/uL — ABNORMAL LOW (ref 3.87–5.11)
RDW: 14 % (ref 11.5–15.5)
WBC: 10.1 10*3/uL (ref 4.0–10.5)
nRBC: 0 % (ref 0.0–0.2)

## 2023-10-01 LAB — BRAIN NATRIURETIC PEPTIDE: B Natriuretic Peptide: 49.5 pg/mL (ref 0.0–100.0)

## 2023-10-01 NOTE — Progress Notes (Signed)
 ADVANCED HEART FAILURE CLINIC NOTE  Primary Care: Shona Norleen PEDLAR, MD Primary Cardiologist: Vishnu Mallipeddi, MD HF Cardiologist: Dr. Gardenia  HPI: Meghan Terry is a 63 y.o. female with CAD status post CABG in 2020 with LIMA to LAD, SVG to OM and SVG to PDA, history of PCI x 3 to the RCA, hypertension, hyperlipidemia, type 2 diabetes, stage IV CKD, chronic anemia and heart failure with preserved ejection fraction presenting today for follow up.  She was recently admitted to the hospital after presenting to outpatient clinic visit with decompensated HFpEF.  She was diuresed 4.5 L with IV Lasix  and discharged home. During our last appointment, we increased diuretics, however, she once more had another episode of decompensated HFpEF leading to admission in 6/24; now over 6 hospitalizations for HFpEF in the past 1 year.   Interval hx:  - She is now s/p cardiomems however presents with 10-14lbs volume overload with no significant increase in PA pressures by cardiomem.  - She has now moved to [facility]  - She is currently taking potassium 40meq twice daily.    Past Medical History:  Diagnosis Date   Acute diastolic CHF (congestive heart failure) (HCC) 06/08/2021   Acute pericarditis 05/29/2023   Acute pulmonary edema (HCC)    Acute renal failure superimposed on stage 4 chronic kidney disease (HCC) 04/30/2022   Anemia due to stage 4 chronic kidney disease (HCC) 07/20/2022   Angina pectoris (HCC) 11/17/2019   Anxiety    Atypical chest pain 12/30/2020   Benign hypertension with CKD (chronic kidney disease) stage IV (HCC) 07/20/2022   CHF (congestive heart failure) (HCC) 06/08/2021   Chronic constipation 11/12/2022   CKD (chronic kidney disease) stage 3b, GFR 30-59 ml/min 12/03/2019   Cognitive impairment 02/02/2023   Coronary artery disease    Coronary artery disease involving native coronary artery of native heart with angina pectoris (HCC) 10/03/2019   Depression 04/25/2017    Diabetes mellitus due to underlying condition with unspecified complications (HCC) 09/05/2019   Diabetes mellitus with stage 4 chronic kidney disease GFR 15-29 (HCC) 07/20/2022   Diabetic nephropathy associated with type 2 diabetes mellitus (HCC) 08/10/2022   Essential hypertension 09/05/2019   Ex-smoker 09/05/2019   Family history of coronary artery disease 09/05/2019   History of depression 04/15/2020   Hx of diabetes mellitus 04/15/2020   Hyperlipidemia with target LDL less than 70 11/24/2019   Hyperphosphatemia 07/20/2022   Hypertension    Hypokalemia 04/30/2022   Hypothyroid    Insomnia 07/14/2023   Migraine 04/25/2017   Moderate recurrent major depression (HCC) 11/12/2022   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 30 years  Controlled with requip    RLS (restless legs syndrome)    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   Syncope and collapse 04/29/2022   Thyroid  disease    UTI (urinary tract infection) 04/30/2022   Current Outpatient Medications  Medication Sig Dispense Refill   acetaminophen  (TYLENOL ) 500 MG tablet Take 2 tablets by mouth 3 times daily as needed for pain 100 tablet 0   aspirin  EC 81 MG tablet Take 1 tablet (81 mg total) by mouth daily. 90 tablet 3   buPROPion  (WELLBUTRIN  XL) 300 MG 24 hr tablet Take 1 tablet (300 mg total) by mouth daily. 90 tablet 2   cholecalciferol   25 MCG (1000 UT) tablet Take 1 tablet (1,000 Units total) by mouth in the morning and at bedtime.     clopidogrel  (PLAVIX ) 75 MG tablet TAKE 1 TABLET BY MOUTH DAILY 90 tablet 2   cyanocobalamin  (VITAMIN B12) 1000 MCG tablet Take 1 tablet (1,000 mcg total) by mouth daily. 30 tablet 1   dapagliflozin  propanediol (FARXIGA ) 10 MG TABS tablet Take 1 tablet (10 mg total) by mouth daily before breakfast.  Hold medication until follow-up with cardiology service. 30 tablet 5   ferrous sulfate  325 (65 FE) MG EC tablet Take 325 mg by mouth every other day.     folic acid  (FOLVITE ) 1 MG tablet Take 1 tablet (1 mg total) by mouth daily. 30 tablet 1   gabapentin  (NEURONTIN ) 100 MG capsule Take 100 mg by mouth 3 (three) times daily.     insulin  glargine (LANTUS) 100 UNIT/ML injection Inject 34 Units into the skin.     isosorbide  mononitrate (IMDUR ) 30 MG 24 hr tablet Take 1 tablet (30 mg total) by mouth daily. 90 tablet 3   levothyroxine  (SYNTHROID ) 50 MCG tablet Take 1 tablet (50 mcg total) by mouth See admin instructions. Take 1 tablet in the morning with breakfast on SATURDAY AND SUNDAY.     levothyroxine  (SYNTHROID ) 75 MCG tablet Take 1 tablet (75 mcg total) by mouth daily Monday thru Friday. 60 tablet 3   linaclotide  (LINZESS ) 145 MCG CAPS capsule Take 145 mcg by mouth daily as needed (diarrhea).     metolazone  (ZAROXOLYN ) 5 MG tablet Once a week on Wednesdays if you gain weight or swelling appreciated. 12 tablet 1   nitroGLYCERIN  (NITROSTAT ) 0.4 MG SL tablet Place 1 tablet (0.4 mg total) under the tongue every 5 (five) minutes x 3 doses as needed for chest pain. 25 tablet 7   ondansetron  (ZOFRAN ) 4 MG tablet take 1 tablet (4 mg) by mouth 2 times per day as needed for nausea 180 tablet 1   pantoprazole  (PROTONIX ) 40 MG tablet Take 1 tablet (40 mg total) by mouth 2 (two) times daily. 60 tablet 1   potassium chloride  SA (KLOR-CON  M) 20 MEQ tablet Take 40 mEq by mouth 2 (two) times daily.     promethazine  (PHENERGAN ) 25 MG tablet Take 1 tablet (25 mg total) by mouth every 6 (six) hours as needed for nausea or vomiting.     rOPINIRole  (REQUIP ) 4 MG tablet Take 1 tablet (4 mg total) by mouth 1 to 3 hours before bedtime. 90 tablet 0   rosuvastatin  (CRESTOR ) 5 MG tablet Take 1 tablet (5 mg total) by mouth at bedtime. 30 tablet 1   topiramate  (TOPAMAX ) 100 MG tablet TAKE 1 TABLET BY MOUTH TWICE DAILY. 180  tablet 3   torsemide  (DEMADEX ) 20 MG tablet Take 60 mg by mouth 2 (two) times daily.     traMADol  (ULTRAM ) 50 MG tablet Take 1 tablet (50 mg total) by mouth daily as needed for moderate pain.     traZODone  (DESYREL ) 150 MG tablet Take 150 mg by mouth at bedtime.     traZODone  (DESYREL ) 50 MG tablet Take 50 mg by mouth at bedtime.     cyclobenzaprine  (FLEXERIL ) 10 MG tablet Take 1 tablet (10 mg total) by mouth 2 (two) times daily as needed for muscle spasms. (Patient not taking: Reported on 10/01/2023) 20 tablet 0   midodrine  (PROAMATINE ) 2.5 MG tablet Take 1 tablet (2.5 mg total) by mouth 3 (three) times daily with meals. (Patient not taking: Reported on  10/01/2023) 60 tablet 11   No current facility-administered medications for this encounter.   Allergies  Allergen Reactions   Abilify [Aripiprazole] Other (See Comments)    Makes me sick    Ciprofloxacin  Nausea And Vomiting   Carbidopa-Levodopa Other (See Comments)    Developed tics while taking    Prednisone  Other (See Comments)    Turns red all over    Venlafaxine Other (See Comments)    Developed tics while taking - reaction to Effexor    Social History   Socioeconomic History   Marital status: Married    Spouse name: Daisha Filosa   Number of children: 2   Years of education: Not on file   Highest education level: Associate degree: occupational, scientist, product/process development, or vocational program  Occupational History   Occupation: disabilty    Comment: EMT/CNA @ [employer]  Tobacco Use   Smoking status: Former    Current packs/day: 0.00    Average packs/day: 1 pack/day for 47.0 years (47.0 ttl pk-yrs)    Types: Cigarettes    Start date: 09/14/1972    Quit date: 09/15/2019    Years since quitting: 4.0    Passive exposure: Past   Smokeless tobacco: Never  Vaping Use   Vaping status: Every Day   Start date: 09/15/2019   Substances: Nicotine  Substance and Sexual Activity   Alcohol use: No   Drug use: No   Sexual activity: Not  Currently  Other Topics Concern   Not on file  Social History Narrative   Right handed   Caffeine-1-2 cups daily   Social Drivers of Health   Financial Resource Strain: High Risk (06/19/2021)   Overall Financial Resource Strain (CARDIA)    Difficulty of Paying Living Expenses: Very hard  Food Insecurity: No Food Insecurity (05/27/2023)   Hunger Vital Sign    Worried About Running Out of Food in the Last Year: Never true    Ran Out of Food in the Last Year: Never true  Transportation Needs: No Transportation Needs (05/27/2023)   PRAPARE - Administrator, Civil Service (Medical): No    Lack of Transportation (Non-Medical): No  Physical Activity: Not on file  Stress: Not on file  Social Connections: Not on file  Intimate Partner Violence: Not At Risk (05/27/2023)   Humiliation, Afraid, Rape, and Kick questionnaire    Fear of Current or Ex-Partner: No    Emotionally Abused: No    Physically Abused: No    Sexually Abused: No   Family History  Problem Relation Age of Onset   Asthma Mother    COPD Mother    Diabetes Mother    Macular degeneration Mother    Congestive Heart Failure Father    Kidney failure Sister    BP 136/70 (BP Location: Left Arm, Patient Position: Sitting, Cuff Size: Normal)   Pulse (!) 106   Wt 80.9 kg (178 lb 6.4 oz)   SpO2 100%   BMI 31.60 kg/m   Wt Readings from Last 3 Encounters:  10/01/23 80.9 kg (178 lb 6.4 oz)  09/07/23 77.4 kg (170 lb 9.6 oz)  08/09/23 77.1 kg (170 lb)   PHYSICAL EXAM: Vitals:   10/01/23 1441  BP: 136/70  Pulse: (!) 106  SpO2: 100%   GENERAL: Well nourished, well developed, and in no apparent distress at rest.  HEENT: There is no scleral icterus.  The mucous membranes are pink and moist.   CHEST: There are no chest wall deformities. There is  no chest wall tenderness. Respirations are unlabored.  Lungs- decreased at bases CARDIAC:  JVP: 10-11 cm          Normal rate with regular rhythm. No murmur.  Pulses are  2+ and symmetrical in upper and lower extremities. +2 edema to knees.  ABDOMEN: Soft, non-tender, non-distended. There are normal bowel sounds.  EXTREMITIES: Warm and well perfused.  NEUROLOGIC: Patient is oriented x3 with no obvious focal neurologic deficits.  PSYCH: Patients affect is appropriate SKIN: Warm and dry; no lesions or wounds.    DATA REVIEW  ECG: 02/18/23: NSR  06/15/23: ST 102 bpm QRS 474 msec 09/07/23: ST 103 bpm QRS 88 ms  ECHO: 12/22/22: LVEF 60-65%, grade II DD; normal RV function. 05/28/23: LVEF 60-65%, grade II DD, normal RV, PASP 26  CATH: 01/18/23: Overall, no significant change when compared to angiography performed in June 2021. LIMA to LAD is patent. Bypass graft to circumflex is patent.  Distribution of native vessel after the insertion is very small.  The native vessel diameter is minuscule. Bypass graft to PDA is patent.  PDA is totally occluded at its ostium and is jailed by stent. Left main is widely patent. LAD is severely and diffusely diseased with 70 to 80% stenosis from proximal to mid.  Competitive flow is noted in the mid LAD due to LIMA graft insertion. Circumflex is totally occluded in the mid vessel.  Very faint collaterals are noted to small threadlike vessels. Right coronary is widely patent and has a heavy burden of stents that extends from the mid vessel into the continuation beyond the origin of the PDA.  The PDA is jailed.  No significant obstruction is noted in the right coronary other than the PDA which is supplied by a graft. Hyperdynamic left ventricle.  EF greater than 70%.  LVEDP is normal. 08/09/23:  Successful Cardiomems placement.  RA 1, PCWP 3, CO/CI (thermo) 4/2.2, PVR 2.5, PAPi 14.  Selective pulmonary angiography of L-PA with no focal stenosis   CARDIOMEMS (PAD goal 5): 09/07/23: PAD 8   ASSESSMENT & PLAN: Heart Failure with preserved EF - Prior to remote monitoring she had 5 hospital admissions with rapid rise in SCr.  Cardiomems placed 11/24. RA 1 PCWP 3 CO/CI 4/2.2 - Unfortunately she had another admission for decompensated HFpEF 8/24. Echo 8/24 showed EF 60-65%, RV normal - Cardiomems PAD less than 5 - Today she presents with volume overload 2+ pitting edema to the knees with no rise in cardiomems pressures. REDS 42%.  - Increase torsemide  to 100mg  BID for 3 days with metolazone  2.5mg  x 3 days. Will reduce to 60mg  BID afterwards.  - Increase KCL to 40meq x 1 week.  - Repeat RHC to calibrate cardiomems device.    2. CAD s/p CABG - 3V CABG w/ LIMA to LAD, SVG-OM, SVG-PDA - She stopped her Toprol  - Continues with chronic stable angina, appear to be her previous pattern.  - Continue Imdur  to 30 mg daily - Ranexa  if she has recurrent chest pain.  - No chest pain.   3. Hyperlipidemia - Continue rosuvastatin  5 mg - Per general cardiology  4. CKD IV - Follows with [facility], Dr. Rachele - Baseline SCr 2.3-2.7 - Repeat BMP with improvement in sCr to 2.01.   5. IDA - Tsat 18%, ferrain 61 - Receiving intermittent Fe infusions. Followed by hem/onc.  I spent 40 minutes caring for this patient today including face to face time, ordering and reviewing labs, extensive discussion regarding cardiomems and  plan for repeat right heart cath, seeing the patient, documenting in the record, and arranging follow ups.   Ria Commander, NP 10/01/23

## 2023-10-01 NOTE — Progress Notes (Signed)
 ReDS Vest / Clip - 10/01/23 1540       ReDS Vest / Clip   Station Marker A    Ruler Value 28    ReDS Value Range High volume overload    ReDS Actual Value 42

## 2023-10-01 NOTE — Telephone Encounter (Signed)
 LMOM

## 2023-10-01 NOTE — Patient Instructions (Signed)
 Medication Changes:  INCREASE Torsemide  to 100mg  (5 tablets) by mouth 2 times daily for 3 days  TAKE METOLAZONE  2.5MG  (1/2 TABLET) BY MOUTH DAILY FOR 3 DAYS.  TAKE POTASSIUM (3 TABLETS) BY MOUTH 2 TIMES DAILY FOR 3 DAYS   THEN AFTER THE 3 DAYS CONTINUE TORSEMIDE  100MG  (5 TABLETS) BY MOUTH 2 TIMES DAILY FOR 1 WEEK WITH POTTASSIUM 40MG  (2 TABLETS) BY MOUTH 2 TIMES DAILY FOR 1 WEEK, THEN GO BACK TO YOUR NORMAL DOSE OF TORESMIDE AND POTASSIUM DAILY.   Lab Work:  Labs done today, we will contact you if your labs are abnormal. No news, is good news.   Testing/Procedures:  You are scheduled for a Cardiac Catheterization on Thursday, January 23 with Dr.  Ria Commander .  1. Please arrive at the [facility] (Main Entrance A) at [facility]: [address] [place], [place] [address] at 10:00 AM (This time is 2 hour(s) before your procedure to ensure your preparation).   Free valet parking service is available. You will check in at ADMITTING. The support person will be asked to wait in the waiting room.  It is OK to have someone drop you off and come back when you are ready to be discharged.    Special note: Every effort is made to have your procedure done on time. Please understand that emergencies sometimes delay scheduled procedures.  2. Diet: Do not eat solid foods after midnight.  The patient may have clear liquids until 5am upon the day of the procedure.  3. Medication instructions in preparation for your procedure:   ON THE MORNING OF YOUR PROCEDURE DO NOT TAKE YOUR TORSEMIDE .  DO NOT TAKE YOUR LANTUS INSULIN  THE MORNING OF YOUR PROCEDURE.   Do not take Diabetes Med FARXIGA  on the day of the procedure and HOLD 48 HOURS AFTER THE PROCEDURE.  On the morning of your procedure, take your Aspirin  81 mg, Plavix  and any morning medicines NOT listed above.  You may use sips of water .  4. Plan to go home the same day, you will only stay overnight if medically necessary. 5.  Bring a current list of your medications and current insurance cards. 6. You MUST have a responsible person to drive you home. 7. Someone MUST be with you the first 24 hours after you arrive home or your discharge will be delayed. 8. Please wear clothes that are easy to get on and off and wear slip-on shoes.  Thank you for allowing us  to care for you!   -- [facility]  Follow-Up in: 2 months  At the [facility], you and your health needs are our priority. We have a designated team specialized in the treatment of Heart Failure. This Care Team includes your primary Heart Failure Specialized Cardiologist (physician), Advanced Practice Providers (APPs- Physician Assistants and Nurse Practitioners), and Pharmacist who all work together to provide you with the care you need, when you need it.   You may see any of the following providers on your designated Care Team at your next follow up:  Dr. Toribio Fuel Dr. Ezra Shuck Dr. Ria Commander Dr. Odis Brownie Greig Mosses, NP Caffie Shed, [place] [facility] [place], [place] Beckey Coe, NP Jordan Lee, NP Tinnie Redman, PharmD   Please be sure to bring in all your medications bottles to every appointment.   Need to Contact Us :  If you have any questions or concerns before your next appointment please send us  a  message through [place] or call our office at [phone].    TO LEAVE A MESSAGE FOR THE NURSE SELECT OPTION 2, PLEASE LEAVE A MESSAGE INCLUDING: YOUR NAME DATE OF BIRTH CALL BACK NUMBER REASON FOR CALL**this is important as we prioritize the call backs  YOU WILL RECEIVE A CALL BACK THE SAME DAY AS LONG AS YOU CALL BEFORE 4:00 PM

## 2023-10-01 NOTE — Telephone Encounter (Signed)
 Pt given instructions to take additional  5mg  of metolazone  Reports she was under the impression she would be given an ADD ON appointment. Reports she will be moving to [place] after today and very concerned about symptoms before leaving  Add on 1/3 @ 3

## 2023-10-02 ENCOUNTER — Other Ambulatory Visit: Payer: Self-pay | Admitting: Cardiology

## 2023-10-02 ENCOUNTER — Encounter: Payer: Self-pay | Admitting: Oncology

## 2023-10-04 ENCOUNTER — Encounter: Payer: Self-pay | Admitting: Oncology

## 2023-10-05 ENCOUNTER — Other Ambulatory Visit (HOSPITAL_COMMUNITY): Payer: Self-pay

## 2023-10-05 ENCOUNTER — Telehealth (HOSPITAL_COMMUNITY): Payer: Self-pay

## 2023-10-05 DIAGNOSIS — I5032 Chronic diastolic (congestive) heart failure: Secondary | ICD-10-CM

## 2023-10-05 NOTE — Telephone Encounter (Signed)
 [id number] valid 10/21/2023-11/21/2023

## 2023-10-05 NOTE — Telephone Encounter (Signed)
-----   Message from Nurse Eileen Stanford B sent at 10/01/2023  4:23 PM EST ----- Regarding: RHC scheduled for 1/23 with ADI Hi patient scheduled for RHC on 1/23 with Adi!    Thank you

## 2023-10-05 NOTE — Progress Notes (Signed)
 Orders placed for RHC with Dr. Gasper Lloyd on 10/21/23

## 2023-10-06 ENCOUNTER — Encounter: Payer: Self-pay | Admitting: Oncology

## 2023-10-06 ENCOUNTER — Inpatient Hospital Stay: Payer: Medicare HMO | Attending: Oncology

## 2023-10-13 ENCOUNTER — Inpatient Hospital Stay: Payer: Self-pay | Admitting: Oncology

## 2023-10-25 ENCOUNTER — Inpatient Hospital Stay: Payer: Medicare HMO | Admitting: Oncology

## 2023-11-08 ENCOUNTER — Ambulatory Visit: Payer: Medicare Other | Admitting: Internal Medicine

## 2023-11-09 ENCOUNTER — Encounter (HOSPITAL_COMMUNITY)
Admission: RE | Disposition: A | Discharge status: Home / Self Care | Payer: Self-pay | Source: Home / Self Care | Attending: Cardiology

## 2023-11-09 ENCOUNTER — Ambulatory Visit (HOSPITAL_COMMUNITY)
Admission: RE | Admit: 2023-11-09 | Discharge: 2023-11-09 | Disposition: A | Discharge status: Home / Self Care | Payer: Medicare HMO | Attending: Cardiology | Admitting: Cardiology

## 2023-11-09 ENCOUNTER — Other Ambulatory Visit: Payer: Self-pay

## 2023-11-09 ENCOUNTER — Encounter (HOSPITAL_COMMUNITY): Payer: Self-pay | Admitting: Cardiology

## 2023-11-09 DIAGNOSIS — Z4509 Encounter for adjustment and management of other cardiac device: Secondary | ICD-10-CM | POA: Insufficient documentation

## 2023-11-09 DIAGNOSIS — I509 Heart failure, unspecified: Secondary | ICD-10-CM

## 2023-11-09 DIAGNOSIS — I5032 Chronic diastolic (congestive) heart failure: Secondary | ICD-10-CM | POA: Diagnosis not present

## 2023-11-09 HISTORY — PX: RIGHT HEART CATH: CATH118263

## 2023-11-09 HISTORY — PX: PRESSURE SENSOR/CARDIOMEMS: CATH118258

## 2023-11-09 LAB — BASIC METABOLIC PANEL
Anion gap: 13 (ref 5–15)
BUN: 39 mg/dL — ABNORMAL HIGH (ref 8–23)
CO2: 24 mmol/L (ref 22–32)
Calcium: 9.1 mg/dL (ref 8.9–10.3)
Chloride: 101 mmol/L (ref 98–111)
Creatinine, Ser: 2.55 mg/dL — ABNORMAL HIGH (ref 0.44–1.00)
GFR, Estimated: 20 mL/min — ABNORMAL LOW (ref >60)
Glucose, Bld: 118 mg/dL — ABNORMAL HIGH (ref 70–99)
Potassium: 3.5 mmol/L (ref 3.5–5.1)
Sodium: 138 mmol/L (ref 135–145)

## 2023-11-09 LAB — POCT I-STAT EG7
Acid-Base Excess: 0 mmol/L (ref 0.0–2.0)
Acid-base deficit: 1 mmol/L (ref 0.0–2.0)
Bicarbonate: 24.4 mmol/L (ref 20.0–28.0)
Bicarbonate: 24.9 mmol/L (ref 20.0–28.0)
Calcium, Ion: 1.19 mmol/L (ref 1.15–1.40)
Calcium, Ion: 1.21 mmol/L (ref 1.15–1.40)
HCT: 29 % — ABNORMAL LOW (ref 36.0–46.0)
HCT: 29 % — ABNORMAL LOW (ref 36.0–46.0)
Hemoglobin: 9.9 g/dL — ABNORMAL LOW (ref 12.0–15.0)
Hemoglobin: 9.9 g/dL — ABNORMAL LOW (ref 12.0–15.0)
O2 Saturation: 68 %
O2 Saturation: 74 %
Potassium: 3.5 mmol/L (ref 3.5–5.1)
Potassium: 3.6 mmol/L (ref 3.5–5.1)
Sodium: 140 mmol/L (ref 135–145)
Sodium: 141 mmol/L (ref 135–145)
TCO2: 26 mmol/L (ref 22–32)
TCO2: 26 mmol/L (ref 22–32)
pCO2, Ven: 39.7 mm[Hg] — ABNORMAL LOW (ref 44–60)
pCO2, Ven: 40.9 mm[Hg] — ABNORMAL LOW (ref 44–60)
pH, Ven: 7.384 (ref 7.25–7.43)
pH, Ven: 7.405 (ref 7.25–7.43)
pO2, Ven: 36 mm[Hg] (ref 32–45)
pO2, Ven: 39 mm[Hg] (ref 32–45)

## 2023-11-09 LAB — CBC
HCT: 35.4 % — ABNORMAL LOW (ref 36.0–46.0)
Hemoglobin: 11.5 g/dL — ABNORMAL LOW (ref 12.0–15.0)
MCH: 29.6 pg (ref 26.0–34.0)
MCHC: 32.5 g/dL (ref 30.0–36.0)
MCV: 91.2 fL (ref 80.0–100.0)
Platelets: 242 10*3/uL (ref 150–400)
RBC: 3.88 MIL/uL (ref 3.87–5.11)
RDW: 13.7 % (ref 11.5–15.5)
WBC: 10.2 10*3/uL (ref 4.0–10.5)
nRBC: 0 % (ref 0.0–0.2)

## 2023-11-09 LAB — GLUCOSE, CAPILLARY
Glucose-Capillary: 110 mg/dL — ABNORMAL HIGH (ref 70–99)
Glucose-Capillary: 80 mg/dL (ref 70–99)

## 2023-11-09 SURGERY — RIGHT HEART CATH
Anesthesia: LOCAL

## 2023-11-09 MED ORDER — SODIUM CHLORIDE 0.9 % IV SOLN: INTRAVENOUS | Status: DC

## 2023-11-09 MED ORDER — LIDOCAINE HCL (PF) 1 % IJ SOLN
INTRAMUSCULAR | Status: DC | PRN
  Administered 2023-11-09: 2 mL via INTRADERMAL

## 2023-11-09 MED ORDER — LIDOCAINE HCL (PF) 1 % IJ SOLN
INTRAMUSCULAR | Status: AC
  Filled 2023-11-09: qty 30

## 2023-11-09 SURGICAL SUPPLY — 5 items
CATH BALLN WEDGE 5F 110CM (CATHETERS) IMPLANT
PACK CARDIAC CATHETERIZATION (CUSTOM PROCEDURE TRAY) ×1 IMPLANT
SHEATH GLIDE SLENDER 4/5FR (SHEATH) IMPLANT
TRANSDUCER W/STOPCOCK (MISCELLANEOUS) IMPLANT
TUBING ART PRESS 72 MALE/FEM (TUBING) IMPLANT

## 2023-11-09 NOTE — Discharge Instructions (Signed)

## 2023-11-09 NOTE — Progress Notes (Signed)
Patient was discharge instructions. She verbalized understanding.

## 2023-11-15 NOTE — H&P (Signed)
Brief Pre-procedure H/P:   Meghan Terry is a 63 y.o. female with CAD status post CABG in 2020 with LIMA to LAD, SVG to OM and SVG to PDA, history of PCI x 3 to the RCA, hypertension, hyperlipidemia, type 2 diabetes, stage IV CKD, chronic anemia and heart failure with preserved ejection fraction presenting today for RHC. Over the past several weeks she has had weight gain despite cardiomems only showing a PA diastolic less than 5. She presents today for RHC and cardiomems recalibration.    Vitals:   11/09/23 1230 11/09/23 1245  BP: 128/66 (!) 131/48  Pulse: 99 (!) 101  Resp:    Temp:    SpO2: 100% 100%   GENERAL: NAD Lungs- cta CARDIAC:  JVP: 7 cm          Normal rate with regular rhythm.  Pulses intact. no edema.  ABDOMEN: Soft, non-tender, non-distended.  EXTREMITIES: Warm and well perfused.  NEUROLOGIC: No obvious FND  ATA REVIEW   ECG: 02/18/23: NSR  06/15/23: ST 102 bpm QRS 474 msec 09/07/23: ST 103 bpm QRS 88 ms   ECHO: 12/22/22: LVEF 60-65%, grade II DD; normal RV function. 05/28/23: LVEF 60-65%, grade II DD, normal RV, PASP 26   CATH: 01/18/23: Overall, no significant change when compared to angiography performed in June 2021. LIMA to LAD is patent. Bypass graft to circumflex is patent.  Distribution of native vessel after the insertion is very small.  The native vessel diameter is minuscule. Bypass graft to PDA is patent.  PDA is totally occluded at its ostium and is jailed by stent. Left main is widely patent. LAD is severely and diffusely diseased with 70 to 80% stenosis from proximal to mid.  Competitive flow is noted in the mid LAD due to LIMA graft insertion. Circumflex is totally occluded in the mid vessel.  Very faint collaterals are noted to small "threadlike vessels". Right coronary is widely patent and has a heavy burden of stents that extends from the mid vessel into the continuation beyond the origin of the PDA.  The PDA is jailed.  No significant obstruction is  noted in the right coronary other than the PDA which is supplied by a graft. Hyperdynamic left ventricle.  EF greater than 70%.  LVEDP is normal. 08/09/23:  Successful Cardiomems placement.  RA 1, PCWP 3, CO/CI (thermo) 4/2.2, PVR 2.5, PAPi 14.  Selective pulmonary angiography of L-PA with no focal stenosis    CARDIOMEMS (PAD goal 5): 09/07/23: PAD 8  A/P - Proceed with RHC.    [name] 10:10 AM

## 2023-11-17 ENCOUNTER — Telehealth (HOSPITAL_COMMUNITY): Payer: Self-pay | Admitting: Adult Health

## 2023-11-17 NOTE — Telephone Encounter (Signed)
  Cardiomems Remote Monitoring  S/P Cardiomems Implant   PAD Goal: 5 Most recent reading: 11. Suggestive of fluid accumulation   Recommended changes: Take extra 20 mg torsemide x 3 days. Please call.   I continue to review and analyze the patients PA pressures weekly (and more often as needed) to bring PA pressures within the optimal range.    Meghan Savitz NP-C  12:15 PM

## 2023-11-17 NOTE — Telephone Encounter (Signed)
Spoke w/pt, she is aware, agreeable, and verbalized understanding.

## 2023-11-18 ENCOUNTER — Ambulatory Visit: Payer: Medicare Other | Admitting: Internal Medicine

## 2023-11-23 ENCOUNTER — Encounter (HOSPITAL_COMMUNITY): Payer: Self-pay | Admitting: Cardiology

## 2023-11-26 ENCOUNTER — Telehealth: Payer: Self-pay | Admitting: Adult Health

## 2023-11-26 NOTE — Telephone Encounter (Signed)
  Cardiomems Remote Monitoring  S/P Cardiomems Implant   PAD Goal: 14 Most recent reading: 16  Recommended changes: Continue current regimen.   Koi Zangara NP-C   3:43 PM   I continue to review and analyze the patients PA pressures weekly (and more often as needed) to bring PA pressures within the optimal range.

## 2023-12-12 NOTE — Progress Notes (Incomplete)
 ADVANCED HEART FAILURE CLINIC NOTE  Primary Care: Benita Stabile, MD Primary Cardiologist: Luane School, MD HF Cardiologist: Dr. Gasper Lloyd  CC: Heart failure with preserved EF  HPI: Meghan Terry is a 63 y.o. female with CAD status post CABG in 2020 with LIMA to LAD, SVG to OM and SVG to PDA, history of PCI x 3 to the RCA, hypertension, hyperlipidemia, type 2 diabetes, stage IV CKD, chronic anemia and heart failure with preserved ejection fraction presenting today for follow up.  She was recently admitted to the hospital after presenting to outpatient clinic visit with decompensated HFpEF.  She was diuresed 4.5 L with IV Lasix and discharged home. During our last appointment, we increased diuretics, however, she once more had another episode of decompensated HFpEF leading to admission in 6/24; now over 6 hospitalizations for HFpEF in the past 1 year.   Interval hx:  - She is once more hypervolemic on exam with 2+ pitting edema to the knees; she reports feeling increased fatigue, shortness of breath, PND & orthopnea. During this time cardiomems readings have not changed much with PA diastolic ranging from 12-97mmHg.    Past Medical History:  Diagnosis Date   Acute diastolic CHF (congestive heart failure) (HCC) 06/08/2021   Acute pericarditis 05/29/2023   Acute pulmonary edema (HCC)    Acute renal failure superimposed on stage 4 chronic kidney disease (HCC) 04/30/2022   Anemia due to stage 4 chronic kidney disease (HCC) 07/20/2022   Angina pectoris (HCC) 11/17/2019   Anxiety    Atypical chest pain 12/30/2020   Benign hypertension with CKD (chronic kidney disease) stage IV (HCC) 07/20/2022   CHF (congestive heart failure) (HCC) 06/08/2021   Chronic constipation 11/12/2022   CKD (chronic kidney disease) stage 3b, GFR 30-59 ml/min 12/03/2019   Cognitive impairment 02/02/2023   Coronary artery disease    Coronary artery disease involving native coronary artery of native heart with  angina pectoris (HCC) 10/03/2019   Depression 04/25/2017   Diabetes mellitus due to underlying condition with unspecified complications (HCC) 09/05/2019   Diabetes mellitus with stage 4 chronic kidney disease GFR 15-29 (HCC) 07/20/2022   Diabetic nephropathy associated with type 2 diabetes mellitus (HCC) 08/10/2022   Essential hypertension 09/05/2019   Ex-smoker 09/05/2019   Family history of coronary artery disease 09/05/2019   History of depression 04/15/2020   Hx of diabetes mellitus 04/15/2020   Hyperlipidemia with target LDL less than 70 11/24/2019   Hyperphosphatemia 07/20/2022   Hypertension    Hypokalemia 04/30/2022   Hypothyroid    Insomnia 07/14/2023   Migraine 04/25/2017   Moderate recurrent major depression (HCC) 11/12/2022   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   Syncope and collapse 04/29/2022   Thyroid disease    UTI (urinary tract infection) 04/30/2022     BP 122/80   Pulse 98   Ht 5\' 3"  (1.6 m)   Wt 77.2 kg (170 lb 3.2 oz)   PF 100 L/min   BMI 30.15 kg/m   Wt Readings from Last 3 Encounters:  12/13/23 77.2 kg (170 lb 3.2 oz)  11/09/23 75.8 kg (167 lb)  10/01/23 80.9 kg (178 lb 6.4 oz)  PHYSICAL EXAM: Vitals:   12/13/23 1438  BP: 122/80  Pulse: 98   GENERAL: NAD Lungs- mild crackles at bases CARDIAC:  JVP: 10 cm          Normal rate with regular rhythm. No murmur.  Pulses 2+. 2+ edema.  ABDOMEN: Soft, non-tender, non-distended.  EXTREMITIES: Warm and well perfused.  NEUROLOGIC: No obvious FND    DATA REVIEW  ECG: 02/18/23: NSR  06/15/23: ST 102 bpm QRS 474 msec 09/07/23: ST 103 bpm QRS 88 ms  ECHO: 12/22/22: LVEF 60-65%, grade II  DD; normal RV function. 05/28/23: LVEF 60-65%, grade II DD, normal RV, PASP 26  CATH: 01/18/23: Overall, no significant change when compared to angiography performed in June 2021. LIMA to LAD is patent. Bypass graft to circumflex is patent.  Distribution of native vessel after the insertion is very small.  The native vessel diameter is minuscule. Bypass graft to PDA is patent.  PDA is totally occluded at its ostium and is jailed by stent. Left main is widely patent. LAD is severely and diffusely diseased with 70 to 80% stenosis from proximal to mid.  Competitive flow is noted in the mid LAD due to LIMA graft insertion. Circumflex is totally occluded in the mid vessel.  Very faint collaterals are noted to small "threadlike vessels". Right coronary is widely patent and has a heavy burden of stents that extends from the mid vessel into the continuation beyond the origin of the PDA.  The PDA is jailed.  No significant obstruction is noted in the right coronary other than the PDA which is supplied by a graft. Hyperdynamic left ventricle.  EF greater than 70%.  LVEDP is normal. 08/09/23:  Successful Cardiomems placement.  RA 1, PCWP 3, CO/CI (thermo) 4/2.2, PVR 2.5, PAPi 14.  Selective pulmonary angiography of L-PA with no focal stenosis   CARDIOMEMS (PAD goal 5): 09/07/23: PAD 8   ASSESSMENT & PLAN: Heart Failure with preserved EF - Prior to remote monitoring she had 5 hospital admissions with rapid rise in SCr. Cardiomems placed 11/24. RA 1 PCWP 3 CO/CI 4/2.2 - Unfortunately she had another admission for decompensated HFpEF 8/24. Echo 8/24 showed EF 60-65%, RV normal - Hypervolemic on exam today with 2+ pitting edema to the knees; currently on torsemide 60mg  BID with cardiomems reading PA diastolic 12-42mmHg. We will increase torsemide to 100mg  in the morning x 3 days and continue torsemide 60mg  at night. KCL 40 daily during this time.  - Repeat BMP/BNP.  - We repeated a RHC on 11/09/23 to  recalibrate her cardiomems. Despite this diastolic readings remain low even when she gains fluid weight. I suspect she is 3rd spacing. This is consistent with her REDS today.  - ReDs reading: 26 %, normal  2. CAD s/p CABG - 3V CABG w/ LIMA to LAD, SVG-OM, SVG-PDA - She stopped her Toprol - Continues with chronic stable angina, appear to be her previous pattern.  - Continue Imdur to 30 mg daily - Ranexa if she has recurrent chest pain.  - No chest pain  3. Hyperlipidemia - Continue rosuvastatin 5 mg - Per general cardiology  4. CKD IV - Follows with [facility], Dr. Wolfgang Phoenix - Baseline SCr 2.3-2.7 - Repeat BMP/BNP today.   5. IDA - Tsat 18%, ferrain 61 - Receiving intermittent Fe infusions. Followed by hem/onc.  I spent 35 minutes caring for this patient today including face to face time, ordering and reviewing labs, reviewing cardiomems readings online, seeing the patient, documenting in the  record, and arranging follow ups.    [name], NP 12/13/23

## 2023-12-13 ENCOUNTER — Ambulatory Visit (HOSPITAL_COMMUNITY)
Admission: RE | Admit: 2023-12-13 | Discharge: 2023-12-13 | Disposition: A | Discharge status: Home / Self Care | Payer: Medicare Other | Source: Ambulatory Visit | Attending: Cardiology | Admitting: Cardiology

## 2023-12-13 ENCOUNTER — Encounter (HOSPITAL_COMMUNITY): Payer: Self-pay | Admitting: Cardiology

## 2023-12-13 VITALS — BP 122/80 | HR 98 | Ht 63.0 in | Wt 170.2 lb

## 2023-12-13 DIAGNOSIS — D509 Iron deficiency anemia, unspecified: Secondary | ICD-10-CM | POA: Insufficient documentation

## 2023-12-13 DIAGNOSIS — Z955 Presence of coronary angioplasty implant and graft: Secondary | ICD-10-CM | POA: Diagnosis not present

## 2023-12-13 DIAGNOSIS — N184 Chronic kidney disease, stage 4 (severe): Secondary | ICD-10-CM | POA: Insufficient documentation

## 2023-12-13 DIAGNOSIS — I5030 Unspecified diastolic (congestive) heart failure: Secondary | ICD-10-CM | POA: Diagnosis present

## 2023-12-13 DIAGNOSIS — Z951 Presence of aortocoronary bypass graft: Secondary | ICD-10-CM | POA: Insufficient documentation

## 2023-12-13 DIAGNOSIS — R600 Localized edema: Secondary | ICD-10-CM | POA: Diagnosis present

## 2023-12-13 DIAGNOSIS — N1832 Chronic kidney disease, stage 3b: Secondary | ICD-10-CM | POA: Diagnosis not present

## 2023-12-13 DIAGNOSIS — E1122 Type 2 diabetes mellitus with diabetic chronic kidney disease: Secondary | ICD-10-CM | POA: Diagnosis not present

## 2023-12-13 DIAGNOSIS — I5032 Chronic diastolic (congestive) heart failure: Secondary | ICD-10-CM | POA: Diagnosis not present

## 2023-12-13 DIAGNOSIS — E785 Hyperlipidemia, unspecified: Secondary | ICD-10-CM | POA: Diagnosis not present

## 2023-12-13 DIAGNOSIS — D631 Anemia in chronic kidney disease: Secondary | ICD-10-CM | POA: Insufficient documentation

## 2023-12-13 DIAGNOSIS — I130 Hypertensive heart and chronic kidney disease with heart failure and stage 1 through stage 4 chronic kidney disease, or unspecified chronic kidney disease: Secondary | ICD-10-CM | POA: Insufficient documentation

## 2023-12-13 DIAGNOSIS — I2510 Atherosclerotic heart disease of native coronary artery without angina pectoris: Secondary | ICD-10-CM | POA: Insufficient documentation

## 2023-12-13 LAB — BASIC METABOLIC PANEL
Anion gap: 11 (ref 5–15)
BUN: 23 mg/dL (ref 8–23)
CO2: 23 mmol/L (ref 22–32)
Calcium: 9.2 mg/dL (ref 8.9–10.3)
Chloride: 104 mmol/L (ref 98–111)
Creatinine, Ser: 2.56 mg/dL — ABNORMAL HIGH (ref 0.44–1.00)
GFR, Estimated: 20 mL/min — ABNORMAL LOW (ref >60)
Glucose, Bld: 96 mg/dL (ref 70–99)
Potassium: 3.7 mmol/L (ref 3.5–5.1)
Sodium: 138 mmol/L (ref 135–145)

## 2023-12-13 LAB — BRAIN NATRIURETIC PEPTIDE: B Natriuretic Peptide: 21 pg/mL (ref 0.0–100.0)

## 2023-12-13 NOTE — Patient Instructions (Signed)
 Medication Changes:  INCREASE TORSEMIDE MORNING DOSE TO 100MG  FOR 3 DAYS   INCREASE POTASSIUM TO FOR 3 DAYS   Lab Work:  Labs done today, your results will be available in MyChart, we will contact you for abnormal readings.  Follow-Up in: ON MAY THE 14TH AT 2PM WITH DR. Gasper Lloyd   At the [facility], you and your health needs are our priority. We have a designated team specialized in the treatment of Heart Failure. This Care Team includes your primary Heart Failure Specialized Cardiologist (physician), Advanced Practice Providers (APPs- Physician Assistants and Nurse Practitioners), and Pharmacist who all work together to provide you with the care you need, when you need it.   You may see any of the following providers on your designated Care Team at your next follow up:  Dr. Arvilla Meres Dr. Marca Ancona Dr. [name] Dr. Theresia Bough Tonye Becket, NP Robbie Lis, [place] [facility] [place], [place] Brynda Peon, NP [place] Lee, NP Karle Plumber, PharmD   Please be sure to bring in all your medications bottles to every appointment.   Need to Contact us:  If you have any questions or concerns before your next appointment please send [place] a message through [facility] or call our office at [phone].    TO LEAVE A MESSAGE FOR THE NURSE SELECT OPTION 2, PLEASE LEAVE A MESSAGE INCLUDING: YOUR NAME DATE OF BIRTH CALL BACK NUMBER REASON FOR CALL**this is important as we prioritize the call backs  YOU WILL RECEIVE A CALL BACK THE SAME DAY AS LONG AS YOU CALL BEFORE 4:00 PM

## 2023-12-13 NOTE — Progress Notes (Signed)
 ReDS Vest / Clip - 12/13/23 1438       ReDS Vest / Clip   Station Marker A    Ruler Value 34    ReDS Value Range Low volume    ReDS Actual Value 26

## 2024-01-20 ENCOUNTER — Inpatient Hospital Stay: Payer: Medicare HMO | Attending: Oncology

## 2024-02-08 NOTE — Progress Notes (Signed)
 ADVANCED HEART FAILURE CLINIC NOTE  Primary Care: Omie Bickers, MD Primary Cardiologist: Vishnu Mallipeddi, MD HF Cardiologist: Dr. Bruce Caper  CC: Heart failure with preserved EF  HPI: Meghan Terry is a 63 y.o. female with CAD status post CABG in 2020 with LIMA to LAD, SVG to OM and SVG to PDA, history of PCI x 3 to the RCA, hypertension, hyperlipidemia, type 2 diabetes, stage IV CKD, chronic anemia and heart failure with preserved ejection fraction presenting today for follow up. Admitted after presenting to outpatient clinic visit with decompensated HFpEF.  She was diuresed 4.5 L with IV Lasix  and discharged home. During our last appointment, we increased diuretics, however, she once more had another episode of decompensated HFpEF leading to admission in 6/24; now over 6 hospitalizations for HFpEF in the past 1 year.   Interval hx:  - She is once more hypervolemic on exam with 2+ pitting edema to the knees; she reports feeling increased fatigue, shortness of breath, PND & orthopnea. During this time cardiomems readings have not changed much with PA diastolic ranging from 12-67mmHg. ***  Today she returns for AHF follow up. Overall feeling ***. Denies palpitations, CP, dizziness, edema, or PND/Orthopnea. *** SOB. Appetite ok. No fever or chills. Weight at home *** pounds. Taking all medications. Denies ETOH, tobacco or drug use.    Past Medical History:  Diagnosis Date   Acute diastolic CHF (congestive heart failure) (HCC) 06/08/2021   Acute pericarditis 05/29/2023   Acute pulmonary edema (HCC)    Acute renal failure superimposed on stage 4 chronic kidney disease (HCC) 04/30/2022   Anemia due to stage 4 chronic kidney disease (HCC) 07/20/2022   Angina pectoris (HCC) 11/17/2019   Anxiety    Atypical chest pain 12/30/2020   Benign hypertension with CKD (chronic kidney disease) stage IV (HCC) 07/20/2022   CHF (congestive heart failure) (HCC) 06/08/2021   Chronic constipation 11/12/2022    CKD (chronic kidney disease) stage 3b, GFR 30-59 ml/min 12/03/2019   Cognitive impairment 02/02/2023   Coronary artery disease    Coronary artery disease involving native coronary artery of native heart with angina pectoris (HCC) 10/03/2019   Depression 04/25/2017   Diabetes mellitus due to underlying condition with unspecified complications (HCC) 09/05/2019   Diabetes mellitus with stage 4 chronic kidney disease GFR 15-29 (HCC) 07/20/2022   Diabetic nephropathy associated with type 2 diabetes mellitus (HCC) 08/10/2022   Essential hypertension 09/05/2019   Ex-smoker 09/05/2019   Family history of coronary artery disease 09/05/2019   History of depression 04/15/2020   Hx of diabetes mellitus 04/15/2020   Hyperlipidemia with target LDL less than 70 11/24/2019   Hyperphosphatemia 07/20/2022   Hypertension    Hypokalemia 04/30/2022   Hypothyroid    Insomnia 07/14/2023   Migraine 04/25/2017   Moderate recurrent major depression (HCC) 11/12/2022   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 30 years  Controlled with requip    RLS (restless legs syndrome)    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   Syncope and collapse 04/29/2022   Thyroid  disease    UTI (urinary tract infection) 04/30/2022    There were no vitals taken for this visit.  Wt Readings from Last 3 Encounters:  12/13/23 77.2 kg (170 lb 3.2 oz)  11/09/23 75.8 kg (167 lb)  10/01/23 80.9 kg (178 lb 6.4 oz)   PHYSICAL EXAM: There were no vitals filed for this visit. General:  *** appearing.  No respiratory difficulty HEENT: normal Neck: supple. JVD *** cm.  Cor: PMI nondisplaced. Regular rate & rhythm. No rubs, gallops or murmurs. Lungs: clear Abdomen: soft, nontender, nondistended. Good bowel sounds. Extremities: no  cyanosis, clubbing, rash, edema  Neuro: alert & oriented x 3. Moves all 4 extremities w/o difficulty. Affect pleasant.    DATA REVIEW  ECG: 02/18/23: NSR  06/15/23: ST 102 bpm QRS 474 msec 09/07/23: ST 103 bpm QRS 88 ms  ECHO: 12/22/22: LVEF 60-65%, grade II DD; normal RV function. 05/28/23: LVEF 60-65%, grade II DD, normal RV, PASP 26  CATH: 01/18/23: Overall, no significant change when compared to angiography performed in June 2021. LIMA to LAD is patent. Bypass graft to circumflex is patent.  Distribution of native vessel after the insertion is very small.  The native vessel diameter is minuscule. Bypass graft to PDA is patent.  PDA is totally occluded at its ostium and is jailed by stent. Left main is widely patent. LAD is severely and diffusely diseased with 70 to 80% stenosis from proximal to mid.  Competitive flow is noted in the mid LAD due to LIMA graft insertion. Circumflex is totally occluded in the mid vessel.  Very faint collaterals are noted to small "threadlike vessels". Right coronary is widely patent and has a heavy burden of stents that extends from the mid vessel into the continuation beyond the origin of the PDA.  The PDA is jailed.  No significant obstruction is noted in the right coronary other than the PDA which is supplied by a graft. Hyperdynamic left ventricle.  EF greater than 70%.  LVEDP is normal. 08/09/23:  Successful Cardiomems placement.  RA 1, PCWP 3, CO/CI (thermo) 4/2.2, PVR 2.5, PAPi 14.  Selective pulmonary angiography of L-PA with no focal stenosis   CARDIOMEMS (PAD goal 5): 09/07/23: PAD 8   ASSESSMENT & PLAN: Heart Failure with preserved EF - Prior to remote monitoring she had 5 hospital admissions with rapid rise in SCr. Cardiomems placed 11/24. RA 1 PCWP 3 CO/CI 4/2.2 - Unfortunately she had another admission for decompensated HFpEF 8/24. Echo 8/24 showed EF 60-65%, RV normal - Hypervolemic on exam today with 2+ pitting edema to the knees;  currently on torsemide  60mg  BID with cardiomems reading PA diastolic 12-65mmHg. We will increase torsemide  to 100mg  in the morning x 3 days and continue torsemide  60mg  at night. KCL 40 daily during this time.  - Repeat BMP/BNP.  - We repeated a RHC on 11/09/23 to recalibrate her cardiomems. Despite this diastolic readings remain low even when she gains fluid weight. I suspect she is 3rd spacing. This is consistent with her REDS today.  - ReDs reading: 26 %, normal  2. CAD s/p CABG - 3V CABG w/ LIMA to LAD, SVG-OM, SVG-PDA - She stopped her Toprol  - Continues with chronic stable angina, appear to be her previous pattern.  - Continue Imdur  to 30 mg daily - Ranexa  if she has recurrent chest pain.  - No chest pain  3. Hyperlipidemia - Continue rosuvastatin  5 mg - Per general cardiology  4. CKD IV - Follows with [facility], Dr. Carrolyn Clan - Baseline SCr 2.3-2.7 - Repeat BMP/BNP today.   5. IDA - Tsat 18%, ferrain 61 - Receiving intermittent Fe infusions. Followed by hem/onc.  Follow up ***   Sheryl Donna, AGACNP-BC  02/08/24

## 2024-02-09 ENCOUNTER — Encounter (HOSPITAL_COMMUNITY): Payer: Self-pay | Admitting: Cardiology

## 2024-02-09 ENCOUNTER — Ambulatory Visit (HOSPITAL_COMMUNITY)
Admission: RE | Admit: 2024-02-09 | Discharge: 2024-02-09 | Disposition: A | Discharge status: Home / Self Care | Source: Ambulatory Visit | Attending: Cardiology | Admitting: Cardiology

## 2024-02-09 VITALS — BP 136/78 | HR 100 | Ht 63.0 in | Wt 170.2 lb

## 2024-02-09 DIAGNOSIS — E1122 Type 2 diabetes mellitus with diabetic chronic kidney disease: Secondary | ICD-10-CM | POA: Diagnosis not present

## 2024-02-09 DIAGNOSIS — E785 Hyperlipidemia, unspecified: Secondary | ICD-10-CM | POA: Diagnosis not present

## 2024-02-09 DIAGNOSIS — D631 Anemia in chronic kidney disease: Secondary | ICD-10-CM | POA: Insufficient documentation

## 2024-02-09 DIAGNOSIS — Z87891 Personal history of nicotine dependence: Secondary | ICD-10-CM | POA: Diagnosis not present

## 2024-02-09 DIAGNOSIS — D509 Iron deficiency anemia, unspecified: Secondary | ICD-10-CM

## 2024-02-09 DIAGNOSIS — I5032 Chronic diastolic (congestive) heart failure: Secondary | ICD-10-CM | POA: Diagnosis present

## 2024-02-09 DIAGNOSIS — I25119 Atherosclerotic heart disease of native coronary artery with unspecified angina pectoris: Secondary | ICD-10-CM | POA: Insufficient documentation

## 2024-02-09 DIAGNOSIS — E7849 Other hyperlipidemia: Secondary | ICD-10-CM

## 2024-02-09 DIAGNOSIS — Z79899 Other long term (current) drug therapy: Secondary | ICD-10-CM | POA: Diagnosis not present

## 2024-02-09 DIAGNOSIS — Z951 Presence of aortocoronary bypass graft: Secondary | ICD-10-CM | POA: Insufficient documentation

## 2024-02-09 DIAGNOSIS — I25118 Atherosclerotic heart disease of native coronary artery with other forms of angina pectoris: Secondary | ICD-10-CM | POA: Diagnosis not present

## 2024-02-09 DIAGNOSIS — I130 Hypertensive heart and chronic kidney disease with heart failure and stage 1 through stage 4 chronic kidney disease, or unspecified chronic kidney disease: Secondary | ICD-10-CM | POA: Insufficient documentation

## 2024-02-09 DIAGNOSIS — N184 Chronic kidney disease, stage 4 (severe): Secondary | ICD-10-CM | POA: Diagnosis not present

## 2024-02-09 MED ORDER — POTASSIUM CHLORIDE CRYS ER 20 MEQ PO TBCR: 40.0000 meq | EXTENDED_RELEASE_TABLET | Freq: Two times a day (BID) | ORAL | 3 refills | Status: AC

## 2024-02-09 MED ORDER — ISOSORBIDE MONONITRATE ER 60 MG PO TB24: 60.0000 mg | ORAL_TABLET | Freq: Every day | ORAL | 3 refills | Status: AC

## 2024-02-09 MED ORDER — METOLAZONE 5 MG PO TABS: 5.0000 mg | ORAL_TABLET | ORAL | 1 refills | Status: DC

## 2024-02-09 NOTE — Addendum Note (Signed)
 Encounter addended by: Zamiah Tollett B, RN on: 02/09/2024 3:38 PM  Actions taken: Clinical Note Signed

## 2024-02-09 NOTE — Patient Instructions (Addendum)
 Medication Changes:  INCREASE ISOSORBIDE  TO 60MG  ONCE DAILY   METOLAZONE  AND POTASSIUM REFILLED   TAKE AN EXTRA OF POTASSIUM WITH METOLAZONE  DOSE ONCE WEEKLY   Follow-Up in: 3 MONTHS WITH DR. Bruce Caper PLEASE CALL OUR OFFICE AROUND JULY  TO GET SCHEDULED FOR YOUR APPOINTMENT. PHONE NUMBER IS [phone] OPTION 2   At the [facility], you and your health needs are our priority. We have a designated team specialized in the treatment of Heart Failure. This Care Team includes your primary Heart Failure Specialized Cardiologist (physician), Advanced Practice Providers (APPs- Physician Assistants and Nurse Practitioners), and Pharmacist who all work together to provide you with the care you need, when you need it.   You may see any of the following providers on your designated Care Team at your next follow up:  Dr. Jules Oar Dr. Peder Bourdon Dr. Alwin Baars Dr. Judyth Nunnery Nieves Bars, NP Ruddy Corral, [place] [facility] [place], [place] Dennise Fitz, NP [place] Lee, NP Luster Salters, PharmD   Please be sure to bring in all your medications bottles to every appointment.   Need to Contact Us :  If you have any questions or concerns before your next appointment please send us  a message through [facility] or call our office at [phone].    TO LEAVE A MESSAGE FOR THE NURSE SELECT OPTION 2, PLEASE LEAVE A MESSAGE INCLUDING: YOUR NAME DATE OF BIRTH CALL BACK NUMBER REASON FOR CALL**this is important as we prioritize the call backs  YOU WILL RECEIVE A CALL BACK THE SAME DAY AS LONG AS YOU CALL BEFORE 4:00 PM

## 2024-02-15 ENCOUNTER — Other Ambulatory Visit (HOSPITAL_COMMUNITY): Payer: Self-pay | Admitting: Cardiology

## 2024-03-02 ENCOUNTER — Other Ambulatory Visit (HOSPITAL_COMMUNITY): Payer: Self-pay

## 2024-03-02 DIAGNOSIS — I5032 Chronic diastolic (congestive) heart failure: Secondary | ICD-10-CM

## 2024-03-02 MED ORDER — METOLAZONE 5 MG PO TABS: 5.0000 mg | ORAL_TABLET | ORAL | 1 refills | Status: AC

## 2024-03-25 ENCOUNTER — Telehealth: Payer: Self-pay | Admitting: Cardiology

## 2024-03-25 NOTE — Telephone Encounter (Signed)
 Outpatient service line:  Received a page reporting leg swelling, pain, redness.  Called patient twice and left a voice message.  No answer asked her to call back.

## 2024-04-07 ENCOUNTER — Telehealth: Payer: Self-pay | Admitting: Cardiology

## 2024-04-07 NOTE — Telephone Encounter (Signed)
 Called to confirm/remind patient of their appointment at the [facility] on 04/10/24.   Appointment:   [] Confirmed  [x] Left mess   [] No answer/No voice mail  [] VM Full/unable to leave message  [] Phone not in service  Patient reminded to bring all medications and/or complete list.  Confirmed patient has transportation. Gave directions, instructed to utilize valet parking.

## 2024-04-10 ENCOUNTER — Encounter: Admitting: Cardiology

## 2024-05-31 ENCOUNTER — Other Ambulatory Visit: Payer: Self-pay | Admitting: Internal Medicine

## 2024-06-01 NOTE — Telephone Encounter (Signed)
 Pt of [facility]

## 2024-06-22 ENCOUNTER — Other Ambulatory Visit (HOSPITAL_COMMUNITY): Payer: Self-pay

## 2024-06-22 MED ORDER — POTASSIUM CHLORIDE ER 20 MEQ PO TBCR: 2.0000 | EXTENDED_RELEASE_TABLET | Freq: Every day | ORAL | 1 refills | Status: AC

## 2024-06-28 ENCOUNTER — Encounter: Payer: Self-pay | Admitting: Family

## 2024-07-12 ENCOUNTER — Encounter (INDEPENDENT_AMBULATORY_CARE_PROVIDER_SITE_OTHER): Payer: Self-pay | Admitting: Gastroenterology

## 2024-08-11 ENCOUNTER — Telehealth (HOSPITAL_COMMUNITY): Payer: Self-pay
